# Patient Record
Sex: Female | Born: 1945
Health system: Southern US, Community
[De-identification: ages and names within clinical notes are randomized; demographics above are authoritative.]

## PROBLEM LIST (undated history)

## (undated) DIAGNOSIS — N184 Chronic kidney disease, stage 4 (severe): Secondary | ICD-10-CM

## (undated) DIAGNOSIS — G4730 Sleep apnea, unspecified: Secondary | ICD-10-CM

## (undated) DIAGNOSIS — J449 Chronic obstructive pulmonary disease, unspecified: Secondary | ICD-10-CM

## (undated) DIAGNOSIS — R519 Headache, unspecified: Secondary | ICD-10-CM

## (undated) DIAGNOSIS — E0500 Thyrotoxicosis with diffuse goiter without thyrotoxic crisis or storm: Secondary | ICD-10-CM

## (undated) DIAGNOSIS — G20A1 Parkinson's disease without dyskinesia, without mention of fluctuations: Secondary | ICD-10-CM

## (undated) DIAGNOSIS — R51 Headache: Secondary | ICD-10-CM

## (undated) DIAGNOSIS — IMO0002 Reserved for concepts with insufficient information to code with codable children: Secondary | ICD-10-CM

## (undated) DIAGNOSIS — J069 Acute upper respiratory infection, unspecified: Secondary | ICD-10-CM

## (undated) DIAGNOSIS — E538 Deficiency of other specified B group vitamins: Secondary | ICD-10-CM

## (undated) DIAGNOSIS — F17200 Nicotine dependence, unspecified, uncomplicated: Secondary | ICD-10-CM

## (undated) DIAGNOSIS — I10 Essential (primary) hypertension: Secondary | ICD-10-CM

## (undated) DIAGNOSIS — I809 Phlebitis and thrombophlebitis of unspecified site: Secondary | ICD-10-CM

## (undated) DIAGNOSIS — R197 Diarrhea, unspecified: Secondary | ICD-10-CM

## (undated) DIAGNOSIS — I2510 Atherosclerotic heart disease of native coronary artery without angina pectoris: Secondary | ICD-10-CM

## (undated) DIAGNOSIS — I6529 Occlusion and stenosis of unspecified carotid artery: Secondary | ICD-10-CM

## (undated) DIAGNOSIS — R0600 Dyspnea, unspecified: Secondary | ICD-10-CM

## (undated) DIAGNOSIS — F329 Major depressive disorder, single episode, unspecified: Secondary | ICD-10-CM

## (undated) DIAGNOSIS — R911 Solitary pulmonary nodule: Secondary | ICD-10-CM

## (undated) DIAGNOSIS — M797 Fibromyalgia: Secondary | ICD-10-CM

## (undated) DIAGNOSIS — K219 Gastro-esophageal reflux disease without esophagitis: Secondary | ICD-10-CM

## (undated) DIAGNOSIS — F419 Anxiety disorder, unspecified: Secondary | ICD-10-CM

## (undated) DIAGNOSIS — F32A Depression, unspecified: Secondary | ICD-10-CM

## (undated) DIAGNOSIS — D649 Anemia, unspecified: Secondary | ICD-10-CM

## (undated) DIAGNOSIS — I340 Nonrheumatic mitral (valve) insufficiency: Secondary | ICD-10-CM

## (undated) DIAGNOSIS — E46 Unspecified protein-calorie malnutrition: Secondary | ICD-10-CM

## (undated) DIAGNOSIS — M48061 Spinal stenosis, lumbar region without neurogenic claudication: Secondary | ICD-10-CM

## (undated) DIAGNOSIS — I82409 Acute embolism and thrombosis of unspecified deep veins of unspecified lower extremity: Secondary | ICD-10-CM

## (undated) DIAGNOSIS — G8929 Other chronic pain: Secondary | ICD-10-CM

## (undated) DIAGNOSIS — K279 Peptic ulcer, site unspecified, unspecified as acute or chronic, without hemorrhage or perforation: Secondary | ICD-10-CM

## (undated) DIAGNOSIS — J310 Chronic rhinitis: Secondary | ICD-10-CM

## (undated) DIAGNOSIS — M810 Age-related osteoporosis without current pathological fracture: Secondary | ICD-10-CM

## (undated) DIAGNOSIS — I739 Peripheral vascular disease, unspecified: Secondary | ICD-10-CM

## (undated) DIAGNOSIS — H9190 Unspecified hearing loss, unspecified ear: Secondary | ICD-10-CM

## (undated) DIAGNOSIS — G20 Parkinson's disease: Secondary | ICD-10-CM

## (undated) DIAGNOSIS — N189 Chronic kidney disease, unspecified: Secondary | ICD-10-CM

## (undated) DIAGNOSIS — M1990 Unspecified osteoarthritis, unspecified site: Secondary | ICD-10-CM

## (undated) HISTORY — DX: Headache, unspecified: R51.9

## (undated) HISTORY — DX: Reserved for concepts with insufficient information to code with codable children: IMO0002

## (undated) HISTORY — DX: Peripheral vascular disease, unspecified: I73.9

## (undated) HISTORY — DX: Anemia, unspecified: D64.9

## (undated) HISTORY — DX: Essential (primary) hypertension: I10

## (undated) HISTORY — DX: Nicotine dependence, unspecified, uncomplicated: F17.200

## (undated) HISTORY — DX: Anxiety disorder, unspecified: F41.9

## (undated) HISTORY — DX: Deficiency of other specified B group vitamins: E53.8

## (undated) HISTORY — DX: Chronic rhinitis: J31.0

## (undated) HISTORY — DX: Depression, unspecified: F32.A

## (undated) HISTORY — DX: Atherosclerotic heart disease of native coronary artery without angina pectoris: I25.10

## (undated) HISTORY — DX: Chronic kidney disease, unspecified: N18.9

## (undated) HISTORY — DX: Chronic kidney disease, stage 4 (severe): N18.4

## (undated) HISTORY — DX: Acute embolism and thrombosis of unspecified deep veins of unspecified lower extremity: I82.409

## (undated) HISTORY — PX: GASTRECTOMY: SHX58

## (undated) HISTORY — DX: Diarrhea, unspecified: R19.7

## (undated) HISTORY — DX: Peptic ulcer, site unspecified, unspecified as acute or chronic, without hemorrhage or perforation: K27.9

## (undated) HISTORY — DX: Spinal stenosis, lumbar region without neurogenic claudication: M48.061

## (undated) HISTORY — DX: Nonrheumatic mitral (valve) insufficiency: I34.0

## (undated) HISTORY — DX: Occlusion and stenosis of unspecified carotid artery: I65.29

## (undated) HISTORY — DX: Fibromyalgia: M79.7

## (undated) HISTORY — DX: Other chronic pain: G89.29

## (undated) HISTORY — DX: Unspecified protein-calorie malnutrition: E46

## (undated) HISTORY — DX: Solitary pulmonary nodule: R91.1

## (undated) HISTORY — DX: Headache: R51

## (undated) HISTORY — DX: Age-related osteoporosis without current pathological fracture: M81.0

## (undated) HISTORY — DX: Major depressive disorder, single episode, unspecified: F32.9

## (undated) HISTORY — DX: Phlebitis and thrombophlebitis of unspecified site: I80.9

## (undated) HISTORY — DX: Thyrotoxicosis with diffuse goiter without thyrotoxic crisis or storm: E05.00

---

## 1997-10-29 HISTORY — PX: CHOLECYSTECTOMY: SHX55

## 1998-04-26 ENCOUNTER — Ambulatory Visit (HOSPITAL_COMMUNITY): Admission: RE | Admit: 1998-04-26 | Discharge: 1998-04-26 | Payer: Self-pay | Admitting: *Deleted

## 1998-05-04 ENCOUNTER — Ambulatory Visit (HOSPITAL_COMMUNITY): Admission: RE | Admit: 1998-05-04 | Discharge: 1998-05-04 | Payer: Self-pay | Admitting: *Deleted

## 1998-05-19 ENCOUNTER — Inpatient Hospital Stay (HOSPITAL_COMMUNITY): Admission: EM | Admit: 1998-05-19 | Discharge: 1998-05-25 | Payer: Self-pay

## 1998-06-15 ENCOUNTER — Inpatient Hospital Stay (HOSPITAL_COMMUNITY): Admission: RE | Admit: 1998-06-15 | Discharge: 1998-06-26 | Payer: Self-pay | Admitting: Surgery

## 1998-06-15 ENCOUNTER — Encounter: Payer: Self-pay | Admitting: Surgery

## 1998-06-30 ENCOUNTER — Inpatient Hospital Stay (HOSPITAL_COMMUNITY): Admission: EM | Admit: 1998-06-30 | Discharge: 1998-07-04 | Payer: Self-pay | Admitting: Surgery

## 1998-06-30 ENCOUNTER — Encounter: Payer: Self-pay | Admitting: Surgery

## 1998-10-12 ENCOUNTER — Ambulatory Visit (HOSPITAL_COMMUNITY): Admission: RE | Admit: 1998-10-12 | Discharge: 1998-10-12 | Payer: Self-pay | Admitting: Endocrinology

## 1998-10-12 ENCOUNTER — Encounter: Payer: Self-pay | Admitting: Endocrinology

## 2000-01-29 ENCOUNTER — Other Ambulatory Visit: Admission: RE | Admit: 2000-01-29 | Discharge: 2000-01-29 | Payer: Self-pay | Admitting: Gynecology

## 2001-10-09 ENCOUNTER — Emergency Department (HOSPITAL_COMMUNITY): Admission: EM | Admit: 2001-10-09 | Discharge: 2001-10-09 | Payer: Self-pay

## 2001-10-26 ENCOUNTER — Emergency Department (HOSPITAL_COMMUNITY): Admission: EM | Admit: 2001-10-26 | Discharge: 2001-10-26 | Payer: Self-pay | Admitting: Emergency Medicine

## 2001-10-26 ENCOUNTER — Encounter: Payer: Self-pay | Admitting: Emergency Medicine

## 2001-11-11 ENCOUNTER — Encounter: Payer: Self-pay | Admitting: Gastroenterology

## 2001-11-11 ENCOUNTER — Encounter: Admission: RE | Admit: 2001-11-11 | Discharge: 2001-11-11 | Payer: Self-pay | Admitting: Gastroenterology

## 2002-09-15 ENCOUNTER — Encounter: Admission: RE | Admit: 2002-09-15 | Discharge: 2002-09-15 | Payer: Self-pay

## 2002-10-21 ENCOUNTER — Encounter: Admission: RE | Admit: 2002-10-21 | Discharge: 2002-10-21 | Payer: Self-pay

## 2002-11-10 ENCOUNTER — Inpatient Hospital Stay (HOSPITAL_COMMUNITY): Admission: EM | Admit: 2002-11-10 | Discharge: 2002-11-13 | Payer: Self-pay | Admitting: Emergency Medicine

## 2002-11-10 ENCOUNTER — Encounter: Payer: Self-pay | Admitting: Emergency Medicine

## 2003-03-05 ENCOUNTER — Inpatient Hospital Stay (HOSPITAL_COMMUNITY): Admission: EM | Admit: 2003-03-05 | Discharge: 2003-03-06 | Payer: Self-pay | Admitting: *Deleted

## 2003-03-05 ENCOUNTER — Encounter: Payer: Self-pay | Admitting: *Deleted

## 2003-06-14 ENCOUNTER — Encounter: Admission: RE | Admit: 2003-06-14 | Discharge: 2003-06-14 | Payer: Self-pay

## 2003-07-08 ENCOUNTER — Encounter: Admission: RE | Admit: 2003-07-08 | Discharge: 2003-07-08 | Payer: Self-pay

## 2004-01-14 ENCOUNTER — Encounter: Admission: RE | Admit: 2004-01-14 | Discharge: 2004-01-14 | Payer: Self-pay | Admitting: Gastroenterology

## 2004-05-15 ENCOUNTER — Encounter: Admission: RE | Admit: 2004-05-15 | Discharge: 2004-05-15 | Payer: Self-pay | Admitting: Gastroenterology

## 2004-06-26 ENCOUNTER — Ambulatory Visit (HOSPITAL_COMMUNITY): Admission: RE | Admit: 2004-06-26 | Discharge: 2004-06-26 | Payer: Self-pay | Admitting: Gastroenterology

## 2004-08-08 ENCOUNTER — Encounter (INDEPENDENT_AMBULATORY_CARE_PROVIDER_SITE_OTHER): Payer: Self-pay | Admitting: *Deleted

## 2004-08-08 ENCOUNTER — Ambulatory Visit (HOSPITAL_COMMUNITY): Admission: RE | Admit: 2004-08-08 | Discharge: 2004-08-08 | Payer: Self-pay | Admitting: Gastroenterology

## 2004-08-22 ENCOUNTER — Ambulatory Visit (HOSPITAL_COMMUNITY): Admission: RE | Admit: 2004-08-22 | Discharge: 2004-08-22 | Payer: Self-pay | Admitting: Gastroenterology

## 2004-08-28 ENCOUNTER — Ambulatory Visit (HOSPITAL_COMMUNITY): Admission: RE | Admit: 2004-08-28 | Discharge: 2004-08-28 | Payer: Self-pay | Admitting: Gastroenterology

## 2005-02-12 ENCOUNTER — Encounter: Admission: RE | Admit: 2005-02-12 | Discharge: 2005-02-12 | Payer: Self-pay

## 2006-02-12 ENCOUNTER — Inpatient Hospital Stay (HOSPITAL_COMMUNITY): Admission: EM | Admit: 2006-02-12 | Discharge: 2006-02-22 | Payer: Self-pay | Admitting: Emergency Medicine

## 2006-04-12 ENCOUNTER — Encounter (INDEPENDENT_AMBULATORY_CARE_PROVIDER_SITE_OTHER): Payer: Self-pay | Admitting: Specialist

## 2006-04-13 ENCOUNTER — Inpatient Hospital Stay (HOSPITAL_COMMUNITY): Admission: RE | Admit: 2006-04-13 | Discharge: 2006-04-14 | Payer: Self-pay | Admitting: General Surgery

## 2006-05-02 ENCOUNTER — Emergency Department (HOSPITAL_COMMUNITY): Admission: EM | Admit: 2006-05-02 | Discharge: 2006-05-03 | Payer: Self-pay | Admitting: Emergency Medicine

## 2006-05-04 ENCOUNTER — Ambulatory Visit (HOSPITAL_COMMUNITY): Admission: RE | Admit: 2006-05-04 | Discharge: 2006-05-04 | Payer: Self-pay | Admitting: Emergency Medicine

## 2006-05-31 ENCOUNTER — Encounter: Admission: RE | Admit: 2006-05-31 | Discharge: 2006-05-31 | Payer: Self-pay | Admitting: Gastroenterology

## 2006-06-25 ENCOUNTER — Encounter: Admission: RE | Admit: 2006-06-25 | Discharge: 2006-06-25 | Payer: Self-pay

## 2007-03-28 ENCOUNTER — Encounter: Payer: Self-pay | Admitting: Emergency Medicine

## 2007-03-29 ENCOUNTER — Inpatient Hospital Stay (HOSPITAL_COMMUNITY): Admission: RE | Admit: 2007-03-29 | Discharge: 2007-04-18 | Payer: Self-pay | Admitting: Internal Medicine

## 2007-04-04 ENCOUNTER — Encounter (INDEPENDENT_AMBULATORY_CARE_PROVIDER_SITE_OTHER): Payer: Self-pay | Admitting: Gastroenterology

## 2007-04-09 ENCOUNTER — Encounter (INDEPENDENT_AMBULATORY_CARE_PROVIDER_SITE_OTHER): Payer: Self-pay | Admitting: Internal Medicine

## 2007-04-09 ENCOUNTER — Ambulatory Visit: Payer: Self-pay | Admitting: Vascular Surgery

## 2007-04-14 ENCOUNTER — Ambulatory Visit: Payer: Self-pay | Admitting: Physical Medicine & Rehabilitation

## 2007-10-31 ENCOUNTER — Ambulatory Visit (HOSPITAL_COMMUNITY): Admission: RE | Admit: 2007-10-31 | Discharge: 2007-10-31 | Payer: Self-pay | Admitting: Anesthesiology

## 2007-11-05 ENCOUNTER — Ambulatory Visit (HOSPITAL_COMMUNITY): Admission: RE | Admit: 2007-11-05 | Discharge: 2007-11-05 | Payer: Self-pay

## 2008-02-03 ENCOUNTER — Encounter (HOSPITAL_COMMUNITY): Admission: RE | Admit: 2008-02-03 | Discharge: 2008-05-03 | Payer: Self-pay | Admitting: Neurology

## 2008-05-23 ENCOUNTER — Encounter: Admission: RE | Admit: 2008-05-23 | Discharge: 2008-05-23 | Payer: Self-pay

## 2008-05-24 ENCOUNTER — Encounter (HOSPITAL_COMMUNITY): Admission: RE | Admit: 2008-05-24 | Discharge: 2008-07-08 | Payer: Self-pay | Admitting: Neurology

## 2008-06-14 ENCOUNTER — Encounter: Admission: RE | Admit: 2008-06-14 | Discharge: 2008-08-30 | Payer: Self-pay | Admitting: Orthopedic Surgery

## 2010-05-23 ENCOUNTER — Encounter: Admission: RE | Admit: 2010-05-23 | Discharge: 2010-05-23 | Payer: Self-pay | Admitting: Internal Medicine

## 2010-07-05 ENCOUNTER — Ambulatory Visit: Payer: Self-pay | Admitting: Vascular Surgery

## 2010-07-18 ENCOUNTER — Inpatient Hospital Stay (HOSPITAL_COMMUNITY): Admission: RE | Admit: 2010-07-18 | Discharge: 2010-07-19 | Payer: Self-pay | Admitting: Vascular Surgery

## 2010-07-18 ENCOUNTER — Ambulatory Visit: Payer: Self-pay | Admitting: Vascular Surgery

## 2010-07-18 ENCOUNTER — Encounter: Payer: Self-pay | Admitting: Vascular Surgery

## 2010-07-18 HISTORY — PX: CAROTID ENDARTERECTOMY: SUR193

## 2010-08-09 ENCOUNTER — Ambulatory Visit: Payer: Self-pay | Admitting: Vascular Surgery

## 2010-11-18 ENCOUNTER — Encounter: Payer: Self-pay | Admitting: Gastroenterology

## 2011-01-11 LAB — URINALYSIS, ROUTINE W REFLEX MICROSCOPIC
Bilirubin Urine: NEGATIVE
Glucose, UA: NEGATIVE mg/dL
Hgb urine dipstick: NEGATIVE
Ketones, ur: NEGATIVE mg/dL
Nitrite: NEGATIVE
Protein, ur: NEGATIVE mg/dL
Specific Gravity, Urine: 1.023 (ref 1.005–1.030)
Urobilinogen, UA: 0.2 mg/dL (ref 0.0–1.0)
pH: 5.5 (ref 5.0–8.0)

## 2011-01-11 LAB — CBC
HCT: 33.8 % — ABNORMAL LOW (ref 36.0–46.0)
HCT: 38.4 % (ref 36.0–46.0)
Hemoglobin: 10.8 g/dL — ABNORMAL LOW (ref 12.0–15.0)
Hemoglobin: 12.7 g/dL (ref 12.0–15.0)
MCH: 30.9 pg (ref 26.0–34.0)
MCH: 31.4 pg (ref 26.0–34.0)
MCHC: 32.5 g/dL (ref 30.0–36.0)
MCHC: 33.1 g/dL (ref 30.0–36.0)
MCV: 94.9 fL (ref 78.0–100.0)
MCV: 94.8 fL (ref 78.0–100.0)
Platelets: 152 10*3/uL (ref 150–400)
Platelets: 179 10*3/uL (ref 150–400)
RBC: 3.56 MIL/uL — ABNORMAL LOW (ref 3.87–5.11)
RBC: 4.05 MIL/uL (ref 3.87–5.11)
RDW: 13.5 % (ref 11.5–15.5)
RDW: 13.4 % (ref 11.5–15.5)
WBC: 8.2 10*3/uL (ref 4.0–10.5)
WBC: 6.6 10*3/uL (ref 4.0–10.5)

## 2011-01-11 LAB — GLUCOSE, CAPILLARY
Glucose-Capillary: 103 mg/dL — ABNORMAL HIGH (ref 70–99)
Glucose-Capillary: 210 mg/dL — ABNORMAL HIGH (ref 70–99)
Glucose-Capillary: 79 mg/dL (ref 70–99)
Glucose-Capillary: 82 mg/dL (ref 70–99)
Glucose-Capillary: 96 mg/dL (ref 70–99)

## 2011-01-11 LAB — BASIC METABOLIC PANEL
BUN: 27 mg/dL — ABNORMAL HIGH (ref 6–23)
CO2: 25 mEq/L (ref 19–32)
Calcium: 7.8 mg/dL — ABNORMAL LOW (ref 8.4–10.5)
Chloride: 111 mEq/L (ref 96–112)
Creatinine, Ser: 1.47 mg/dL — ABNORMAL HIGH (ref 0.4–1.2)
GFR calc Af Amer: 43 mL/min — ABNORMAL LOW (ref >60)
GFR calc non Af Amer: 36 mL/min — ABNORMAL LOW (ref >60)
Glucose, Bld: 94 mg/dL (ref 70–99)
Potassium: 4.5 mEq/L (ref 3.5–5.1)
Sodium: 139 mEq/L (ref 135–145)

## 2011-01-11 LAB — COMPREHENSIVE METABOLIC PANEL
ALT: 15 U/L (ref 0–35)
AST: 20 U/L (ref 0–37)
Albumin: 3.4 g/dL — ABNORMAL LOW (ref 3.5–5.2)
Alkaline Phosphatase: 92 U/L (ref 39–117)
BUN: 28 mg/dL — ABNORMAL HIGH (ref 6–23)
CO2: 32 mEq/L (ref 19–32)
Calcium: 8.7 mg/dL (ref 8.4–10.5)
Chloride: 102 mEq/L (ref 96–112)
Creatinine, Ser: 1.83 mg/dL — ABNORMAL HIGH (ref 0.4–1.2)
GFR calc Af Amer: 34 mL/min — ABNORMAL LOW (ref >60)
GFR calc non Af Amer: 28 mL/min — ABNORMAL LOW (ref >60)
Glucose, Bld: 83 mg/dL (ref 70–99)
Potassium: 5.6 mEq/L — ABNORMAL HIGH (ref 3.5–5.1)
Sodium: 141 mEq/L (ref 135–145)
Total Bilirubin: 0.6 mg/dL (ref 0.3–1.2)
Total Protein: 5.2 g/dL — ABNORMAL LOW (ref 6.0–8.3)

## 2011-01-11 LAB — POCT I-STAT 4, (NA,K, GLUC, HGB,HCT)
Glucose, Bld: 79 mg/dL (ref 70–99)
HCT: 41 % (ref 36.0–46.0)
Hemoglobin: 13.9 g/dL (ref 12.0–15.0)
Potassium: 5.1 mEq/L (ref 3.5–5.1)
Sodium: 140 mEq/L (ref 135–145)

## 2011-01-11 LAB — PROTIME-INR
INR: 0.87 (ref 0.00–1.49)
Prothrombin Time: 12 seconds (ref 11.6–15.2)

## 2011-01-11 LAB — TYPE AND SCREEN
ABO/RH(D): A POS
Antibody Screen: NEGATIVE

## 2011-01-11 LAB — SURGICAL PCR SCREEN
MRSA, PCR: NEGATIVE
Staphylococcus aureus: NEGATIVE

## 2011-01-11 LAB — URINE MICROSCOPIC-ADD ON

## 2011-01-11 LAB — APTT: aPTT: 27 seconds (ref 24–37)

## 2011-01-31 ENCOUNTER — Other Ambulatory Visit (INDEPENDENT_AMBULATORY_CARE_PROVIDER_SITE_OTHER): Payer: Medicare Other

## 2011-01-31 ENCOUNTER — Ambulatory Visit: Payer: Medicare Other | Admitting: Vascular Surgery

## 2011-01-31 DIAGNOSIS — Z48812 Encounter for surgical aftercare following surgery on the circulatory system: Secondary | ICD-10-CM

## 2011-01-31 DIAGNOSIS — I6529 Occlusion and stenosis of unspecified carotid artery: Secondary | ICD-10-CM

## 2011-02-14 ENCOUNTER — Ambulatory Visit: Payer: Medicare Other | Admitting: Vascular Surgery

## 2011-02-15 NOTE — Procedures (Unsigned)
CAROTID DUPLEX EXAM  INDICATION:  Left carotid endarterectomy.  HISTORY: Diabetes:  Yes. Cardiac:  No. Hypertension:  Yes. Smoking:  Yes. Previous Surgery:  Left carotid endarterectomy on 07/18/2010. CV History:  Currently asymptomatic. Amaurosis Fugax No, Paresthesias No, Hemiparesis No. Other:  Patient declined to have bilateral brachial pressures taken.                                      RIGHT             LEFT Brachial systolic pressure: Brachial Doppler waveforms: Vertebral direction of flow:        Antegrade         Antegrade DUPLEX VELOCITIES (cm/sec) CCA peak systolic                   63                99 ECA peak systolic                   199               84 ICA peak systolic                   261               68 ICA end diastolic                   79                22 PLAQUE MORPHOLOGY:                  Heterogenous PLAQUE AMOUNT:                      Moderate/severe   None PLAQUE LOCATION:                    ICA/ECA  IMPRESSION: 1. Doppler velocities suggest a high-end 60% to 79% stenosis of the     right proximal internal carotid artery. 2. Patent left carotid endarterectomy site with no left internal     carotid artery stenosis. 3. Significant improvement of the left internal carotid artery when     compared to the previous examination on 07/05/2010 with no change     in the right internal carotid artery noted.  ___________________________________________ Judeth Cornfield. Scot Dock, M.D.  CH/MEDQ  D:  01/31/2011  T:  01/31/2011  Job:  [id number]

## 2011-03-07 ENCOUNTER — Ambulatory Visit (INDEPENDENT_AMBULATORY_CARE_PROVIDER_SITE_OTHER): Payer: Medicare Other | Admitting: Vascular Surgery

## 2011-03-07 DIAGNOSIS — I6529 Occlusion and stenosis of unspecified carotid artery: Secondary | ICD-10-CM

## 2011-03-08 NOTE — Assessment & Plan Note (Signed)
OFFICE VISIT  [name], [name] DOB:  1946-01-08                                       03/07/2011 [id number]  I saw patient in the office today for continued follow-up of her carotid disease.  This is a pleasant 65 year old woman who underwent a left carotid endarterectomy in September 2011 for greater than 80% left carotid stenosis.  She comes in for a 47-month follow-up visit.  Since I saw her last, she has had no history of stroke, TIAs, expressive or receptive aphasia, or amaurosis fugax.  SOCIAL HISTORY:  She continues to smoke 2 packs per day of cigarettes and has been smoking for 50 years.  REVIEW OF SYSTEMS:  CARDIOVASCULAR:  She has had no chest pain, chest pressure, palpitations or arrhythmias. PULMONARY:  She has had no productive cough, bronchitis, asthma, or wheezing.  PHYSICAL EXAMINATION:  This is a pleasant 65 year old woman who appears her stated age.  Blood pressure is 146/67, heart rate is 79, respiratory rate 16.  Lungs are clear bilaterally to auscultation without rales, rhonchi or wheezing.  Cardiovascular:  She has a right carotid bruit. She has a regular rate and rhythm.  Abdomen:  Soft and nontender with normal pitched bowel sounds.  Neurologic:  She has no focal weakness or paresthesias.  I have independently interpreted her carotid duplex scan from 01/31/11, which shows no evidence of recurrent carotid stenosis on the left where she has had a left carotid endarterectomy.  She has a 60% to 79% right carotid stenosis, and the velocities have remained relatively stable compared to her study 6 months ago.  She understands that we would only consider right carotid endarterectomy if the stenosis progressed to greater than 80% or if she developed new neurologic symptoms.  We have discussed potential symptoms of carotid disease.  In addition, we have had a long discussion about the importance of tobacco cessation, and she  understands that continued tobacco use does certainly increase her risk of continued progression of her disease and risk of stroke.  I have given her the information for [facility] tobacco cessation program.  I have encouraged her to continue taking her aspirin.  I will see her back in 1 year unless her duplex scan in 6 months shows any significant change.    Judeth Cornfield. Scot Dock, M.D. Electronically Signed  CSD/MEDQ  D:  03/07/2011  T:  03/08/2011  Job:  [id number]  cc:   Thressa Sheller, M.D.

## 2011-03-13 NOTE — Consult Note (Signed)
NAMEMarland Kitchen  [name], [name] NO.:  [id number]   MEDICAL RECORD NO.:  [id number]          PATIENT TYPE:  INP   LOCATION:  2107                         FACILITY:  [facility]   PHYSICIAN:  Merri Ray. Grandville Silos, M.D.DATE OF BIRTH:  1945-11-15   DATE OF CONSULTATION:  03/29/2007  DATE OF DISCHARGE:                                 CONSULTATION   CHIEF COMPLAINT:  Diarrhea and abdominal pain.   HISTORY OF PRESENT ILLNESS:  The patient is a 65 year old white female  who I was asked to evaluate by Dr. Stephannie Peters from the [facility] in  regard to diarrhea and abdominal pain.  She is known to me status post  laparoscopic cholecystectomy in 03/2006.  She was admitted to the  [facility] overnight with prolonged diarrhea and significant  dehydration with acute renal failure. She has had two weeks of  significant diarrhea.  This was persistent and she had some mental  status changes, prompting her family to bring her for evaluation. She  was initially seen at [facility] but has been transferred to the  intensive care unit at [facility]. The patient has had a history of  recurrent urinary tract infections as well.  She was recently treated on  Cipro. The patient is currently complaining of back pain and abdominal  pain, but she cannot give much other history at this time. Some of  further history is gleaned from the history and physical as family  members are not present at this time.   PAST MEDICAL HISTORY:  Asthma, fibromyalgia, hypertension, anemia,  hypothyroidism and Cushing disease.   PAST SURGICAL HISTORY:  Laparoscopic cholecystectomy.  She has also had  a Billroth gastrectomy, uncertain if it is a I or  II.   SOCIAL HISTORY:  She is a current smoker.  She does not drink alcohol   ALLERGIES:  NO KNOWN DRUG ALLERGIES.   FAMILY HISTORY:  Some diabetes.   REVIEW OF SYSTEMS:  She is unable to comply, due to her mental status,  beyond what is listed above.   PHYSICAL  EXAM:  VITAL SIGNS: Temperature 100.4, blood pressure 99/36,  heart rate 109, respirations 26, saturations 100%. Urine output has been  about 800 mL/90.  NEURO EXAM: She is drowsy.  She does answer a few questions but is not  able to completely cooperate.  NECK: Supple.  LUNGS:  Clear to auscultation.  HEART: Regular, slightly tachycardiac.  ABDOMEN: Nondistended.  Bowel sounds are present.  She has some diffuse,  moderate tenderness. She is more tender and the bilateral lower  quadrants with some intermittent voluntary guarding. She has healed  scars from previous surgery.  EXTREMITIES:  Have no significant edema.   Data reviewed includes white blood cell count 13.7, hemoglobin 8.8,  creatinine 3.12, potassium 5.9. Chest x-ray  demonstrates no free  abdominal air.  There is atelectasis present. Abdominal x-ray on 05/30  had some small bowel gas, but was unremarkable with no colonic  distension.   IMPRESSION AND PLAN:  Diarrhea and abdominal pain with acute renal  failure. I agree that C. difficile colitis is  a possibility. She may  also have some ischemic colitis. Her exam is concerning but plain films  have been normal.  I agree with checking a CT scan of her abdomen and  pelvis soon this morning. I also agree with continuing hydration per the  Medical Service. Her anemia and hyperkalemia will also be addressed by  her primary service.  I would recommend holding her Lovenox pending her  CT results in case she needs emergency surgery.  Apparently the patient  is a do not resuscitate as she has an order on the chart. If findings on  the CT would suggest need for operative intervention, we will need to  speak with her family further.      Merri Ray Grandville Silos, M.D.  Electronically Signed     BET/MEDQ  D:  03/29/2007  T:  03/29/2007  Job:  [id number]

## 2011-03-13 NOTE — Op Note (Signed)
NAMEMarland Kitchen  [name], [name] NO.:  [id number]   MEDICAL RECORD NO.:  [id number]          PATIENT TYPE:  INP   LOCATION:  3017                         FACILITY:  [facility]   PHYSICIAN:  John C. Amedeo Plenty, M.D.    DATE OF BIRTH:  04-04-1946   DATE OF PROCEDURE:  04/04/2007  DATE OF DISCHARGE:                               OPERATIVE REPORT   PROCEDURE PERFORMED:  Esophagogastroduodenoscopy with biopsy.   INDICATIONS FOR PROCEDURE:  Persistent diarrhea and abdominal pain with  as far as I can tell celiac disease has not been ruled out.   The procedure is done today in conjunction with flexible sigmoidoscopy  to rule out microscopic colitis or C. difficile negative  Pseudomembranous colitis.   PROCEDURE IN DETAIL:  The patient was placed in the left lateral  decubitus position and placed on the pulse monitor with continuous low-  flow oxygen delivered by nasal cannula.  She was sedated with a 100 mcg  IV fentanyl and 7.5 mg IV versed.  The Olympus endoscope was advanced  under direct vision into the oropharynx and the esophagus.  The  esophagus was straight and of normal caliber.  At the squamocolumnar  line at 38-cm, there was no visible hiatal hernia, ring strictures or  other abnormalities of the GE junction.  The stomach was entered and a  small amount of liquid secretions were suctioned from the fundus.  A  retroflexed view of the cardia was unremarkable.  The fundus, body,  antrum and pylorus all appeared normal.  The duodenum was entered and  both the bulb and second portions were well-inspected and appeared to be  within normal limits.  Biopsies were taken of the distal duodenum to  rule out celiac disease.   The scope was then withdrawn and the patient prepared for sigmoidoscopy.  She tolerated the procedure well.  There were no immediate  complications.   IMPRESSION:  Normal endoscopy.   PLAN:  Await biopsy results and we will proceed with sigmoidoscopy.       ______________________________  Elyse Jarvis. Amedeo Plenty, M.D.     JCH/MEDQ  D:  04/04/2007  T:  04/04/2007  Job:  [id number]   cc:   Jeryl Columbia, M.D.

## 2011-03-13 NOTE — Assessment & Plan Note (Signed)
OFFICE VISIT   [name], [name]  DOB:  1946-03-08                                       08/09/2010  [id number]   I saw the patient in the office today for followup after her recent left  carotid endarterectomy.  This is a 65 year old woman who was found to  have a carotid bruit by Dr. Noah Delaine.  This prompted a duplex scan  which showed bilateral carotid disease with a greater than 80% left  carotid stenosis.  Left carotid endarterectomy was recommended in order  to lower her risk of future stroke.  She underwent left carotid  endarterectomy with Dacron patch angioplasty on 07/18/2010.  She did  well postoperatively and was discharged on postoperative day #1.  She  returns for her first outpatient visit.  Overall, she has been doing  well and has no specific complaints.  She has had no focal weakness or  paresthesias.   PHYSICAL EXAMINATION:  General:  This is a pleasant 65 year old woman  who appears her stated age.  Vital signs:  Blood pressure is 121/71,  heart rate is 82, temperature is 98.4.  Lungs:  Clear bilaterally to  auscultation.  Neck:  Incision has healed nicely.  She has good strength  in her upper extremities and lower extremities bilaterally.   Overall I am pleased with her progress.  She does have a 60%-79% left  carotid stenosis and I have arranged for a carotid duplex scan in 6  months.  I will see her back at that time.  In the meantime she knows to  continue taking her aspirin.  She knows to call sooner if she has  problems.     Judeth Cornfield. Scot Dock, M.D.  Electronically Signed   CSD/MEDQ  D:  08/09/2010  T:  08/10/2010  Job:  [id number]   cc:   Thressa Sheller, M.D.

## 2011-03-13 NOTE — Consult Note (Signed)
NAMEMarland Kitchen  KIAHRA, SEYBERT NO.:  [id number]   MEDICAL RECORD NO.:  [id number]          PATIENT TYPE:  INP   LOCATION:  2107                         FACILITY:  [facility]   PHYSICIAN:  Wonda Horner, M.D.   DATE OF BIRTH:  1946/05/23   DATE OF CONSULTATION:  03/29/2007  DATE OF DISCHARGE:                                 CONSULTATION   REASON FOR CONSULTATION:  The patient is a 65 year old white female who  sees Dr. Watt Climes in our practice. The patient has a history of chronic  diarrhea.  She also has had a prior Billroth I gastrojejunostomy.  Over  the past couple of weeks, the patient has had a worsening of her  diarrhea.  She saw Dr. Watt Climes a week or so ago, and he tried her on  Questran to see if this would help.  The patient's sister who is  providing the history states that it might have helped a little, but she  continued to have severe diarrhea.  There is no report of frank bleeding  in the diarrhea.  The patient is unable to give any history and the  sister is giving it all.  The patient has a history of recurring urinary  tract infections. She has taken Cipro in the past for this, but  according to the sister, she has not been on any Cipro in a couple of  months.  According to the sister also, the patient has been experiencing  abdominal pain.  Stool studies have been ordered and are pending.  The  CT of the abdomen and pelvis was done today.  There was no evidence of  colitis, but there is diffuse small bowel wall thickening which could be  ischemic or infectious.  The CT also raises the question of pneumonia.   PAST HISTORY:  From review of chart is that she has history of asthma,  fibromyalgia, hypertension, anemia, B12 deficiency, hypothyroidism, and  Cushing's.   ALLERGIES:  No drug allergies.   MEDICATIONS:  Medications taking at home according to the H and P were  Levsin, Cipro, Vistaril, Lasix, Phenergan, PTU, Prozac, Klonopin,  Percocet, prednisone,  enalapril, hydrochlorothiazide, potassium  chloride, Nexium, Robinul, iron and Questran.  As stated above, however,  the sister said that she had not taken Cipro in a couple of months.   PAST SURGICAL HISTORY:  Prior surgeries have included a cholecystectomy  and Billroth I surgery.   PHYSICAL EXAMINATION:  VITAL SIGNS:  Stable.  She is very drowsy.  HEART:  Regular rhythm.  LUNGS:  Clear.  ABDOMEN:  Bowel sounds were present.  There is diffuse  tenderness.   White blood cell count 13,700.   IMPRESSION:  1. Chronic diarrhea.  2. Acute worsening diarrhea, rule out infectious versus ischemic as      the source of all of this new onset diarrhea.  Stool studies are      being checked.  3. Abnormal CT scan with diffuse small bowel wall thickening, ischemic      versus infectious in etiology.  4. Sepsis.  Blood cultures are being checked.  The patient is on      Zosyn.  5. Pneumonia per CT scan is a consideration.   PLAN:  From a GI standpoint, the stools are being checked to look for  enteric pathogens, ova and parasites and also Clostridium difficile  toxin.  Another consideration is ischemic.  Supportive care will be  continued.  Antibiotics will be continued.           ______________________________  Wonda Horner, M.D.     SFG/MEDQ  D:  03/29/2007  T:  03/29/2007  Job:  [id number]   cc:   Dr. Denese Killings, MD

## 2011-03-13 NOTE — Procedures (Signed)
CAROTID DUPLEX EXAM   INDICATION:  Carotid stenosis.   HISTORY:  Diabetes:  Yes.  Cardiac:  No.  Hypertension:  Yes.  Smoking:  Yes.  Previous Surgery:  No.  CV History:  Asymptomatic.  Amaurosis Fugax , Paresthesias , Hemiparesis                                       RIGHT             LEFT  Brachial systolic pressure:         116               112  Brachial Doppler waveforms:         Within normal limits                Within normal limits  Vertebral direction of flow:        Antegrade         Antegrade  DUPLEX VELOCITIES (cm/sec)  CCA peak systolic                   82                96  ECA peak systolic                   147               0000000  ICA peak systolic                   305               [id number]  ICA end diastolic                   94                132  PLAQUE MORPHOLOGY:                  Heterogenous      Heterogenous  PLAQUE AMOUNT:                      Large             Large  PLAQUE LOCATION:                    ICA, ECA          CCA, ICA, ECA   IMPRESSION:  1. Right internal carotid artery velocities suggestive of 60% to 79%      stenosis, high end.  2. Left internal carotid artery velocities suggestive of 80% to 99%      stenosis.  3. Bilateral external carotid artery stenosis.   ___________________________________________  Judeth Cornfield. Scot Dock, M.D.   EM/MEDQ  D:  07/05/2010  T:  07/05/2010  Job:  [id number]

## 2011-03-13 NOTE — H&P (Signed)
HISTORY AND PHYSICAL EXAMINATION   July 05, 2010   Re:  [name], [name]               DOB:  [birth year]   REASON FOR ADMISSION:  Greater than 80% asymptomatic left carotid  stenosis.   HISTORY:  This is a pleasant 65 year old woman who I saw in consultation  on 07/05/2010. Dr. Alyson Ingles heard a carotid bruit.  This prompted a  duplex scan which showed bilateral carotid disease and she was sent for  carotid evaluation.  Of note she is right-handed.  She has had no  previous history of stroke, TIAs, expressive or receptive aphasia or  amaurosis fugax.  She does state she had 2 episodes when she had some  numbness her left hand but this appears to be related to sleeping on her  arm.   PAST MEDICAL HISTORY:  Significant for adult onset diabetes,  hypertension, hypercholesterolemia, COPD, fibromyalgia, Graves disease,  inflammatory bowel disease.  She also has a history of peripheral  neuropathy.  She also has history of some mild renal insufficiency.   PAST SURGICAL HISTORY:  Significant for partial gastrectomy in the past  for peptic ulcer disease.   SOCIAL HISTORY:  She is widowed.  She has one child.  She continues to  smoke 2 packs per day of cigarettes.   FAMILY HISTORY:  She had 2 sisters with diabetes.  She is unaware of any  history of premature cardiovascular disease.   ALLERGIES:  No known drug allergies.   MEDICATIONS:  1. Multivitamin one p.o. daily.  2. Simvastatin 10 mg p.o. daily.  3. Amlodipine 5 mg p.o. daily.  4. Furosemide 20 mg p.o. daily.  5. Dicyclomine 20 mg p.o. daily.  6. Vitamin D p.o. daily.  7. Aspirin 81 mg p.o. daily.  8. Prevacid 30 mg p.o. b.i.d.  9. Actos 15 mg p.o. daily.  10.Reglan 5 mg p.o. q.i.d.  11.Klor-Con 20 one p.o. daily.  12.Prednisone 5 mg p.o. daily.  13.Promethazine 25 mg p.o. p.r.n.  14.Klonopin 1 mg p.o. p.r.n.  15.Cymbalta 60 mg p.o. b.i.d.  16.Robinul Forte 2 mg p.o. p.r.n.  17.Percocet 5/325 mg  one every 6-8 hours p.r.n. pain.  18.Fentanyl 75 mcg every 48 hours on and off patch.   REVIEW OF SYSTEMS:  GENERAL:  She has had no recent weight loss, weight  gain or problems with appetite.  CARDIOVASCULAR:  She has had no chest pain, chest pressure.  She has  occasional palpitations.  She does admit to dyspnea on exertion.  She  has had some mild leg pain with ambulation.  GI:  She has a history of peptic ulcer disease and also history of  reflux.  She has occasional problems swallowing.  NEUROLOGIC:  She has had headaches.  PULMONARY:  She has had productive cough in the past, she has had  occasional problems with asthma and wheezing.  GU:  She has some dysuria  and urinary frequency.  ENT: She has had some change in her hearing recently.  MUSCULOSKELETAL:  She has a history of arthritis.  PSYCHIATRIC:  She has a history of depression.  HEMATOLOGY:  She had no bleeding problems or clotting disorders.   PHYSICAL EXAMINATION:  This is a pleasant 64 woman who appears her  stated age.  Blood pressure 110/66, heart rate is 77, saturation 97%.  HEENT:  Unremarkable.  Lungs:  Clear bilaterally to auscultation without  rales, rhonchi or wheezing.  Cardiovascular:  She has bilateral  carotid  bruits.  She has a regular rate and rhythm.  She has palpable femoral  pulses and warm well-perfused feet.  She has no significant peripheral  edema.  Abdomen:  Soft and nontender with normal pitched bowel sounds.  Musculoskeletal:  No major deformities or cyanosis.  Neurologic:  She  has no focal weakness or paresthesias.  Skin: There are no ulcers or  rashes.   I have independently interpreted her carotid duplex scan today in the  office which shows a 60%-79% right carotid stenosis with a greater than  80% left carotid stenosis.  Both vertebral arteries are patent with  normally directed flow.  She appears to have a normal internal carotid  artery past the stenosis in the bifurcation at the mid  hyoid level.    Given the severity of the left carotid stenosis, I have recommended left  carotid endarterectomy in order to lower her risk of future stroke.  We  have discussed the indications for this procedure and potential  complications including but not limited to bleeding, wound healing  problems, stroke (periprocedural risk 1%-2%), nerve injury, MI, or other  unpredictable medical problems.  All questions were answered.  She is  agreeable to proceed.  Her surgery has been scheduled for 07/18/2010.  She knows to continue her aspirin right up through surgery.     [name], M.D.  Electronically Signed   CSD/MEDQ  D:  07/05/2010  T:  07/06/2010  Job:  [id number]   cc:   Thressa Sheller, M.D.

## 2011-03-13 NOTE — Discharge Summary (Signed)
NAMEMarland Kitchen  ELENNA, POZZA NO.:  [id number]   MEDICAL RECORD NO.:  [id number]          PATIENT TYPE:  INP   LOCATION:  [id number]                         FACILITY:  [facility]   PHYSICIAN:  Vernell Leep, MD     DATE OF BIRTH:  11/02/1945   DATE OF ADMISSION:  03/29/2007  DATE OF DISCHARGE:                               DISCHARGE SUMMARY   Interim Discharge Summary   DISCHARGE DIAGNOSES:  1. Enteritis of unclear etiology - infectious versus inflammatory      versus ischemic.  2. Small bowel and colonic ileus.  3. Acute renal failure.  4. Hypokalemia.  5. Hypertension.  6. Leukocytosis.  7. Questionable pancreatitis.  8. Questionable enterococcal urinary tract infection.  9. Pneumonia.  10.Anemia.  11.Hypothyroidism.  12.Chronic steroid use.  13.Chronic pain/fibromyalgia.  14.Depression.  15.History of hyperthyroidism.  16.No code blue.  17.Rhabdomyolysis.   DISCHARGE MEDICATIONS:  Dictated at the time of discharge.   PROCEDURES:  1. Portable abdominal x-ray on April 01, 2007.  Impression is bowel gas      pattern most consistent with ileus type pattern.  No significant      change.  2. On March 31, 2007, also portable abdominal x-ray.  Impression is gas-      filled colon in the small bowel likely representing diffuse ileus.  3. On Mar 28, 2007, a chest x-ray.  Impression is worsening bibasilar      atelectasis versus scarring.  4. On Mar 29, 2007, an abdominal CT without contrast.  Impression is      no acute abdominal findings, no evidence of colitis, renal cortical      thinning with mild pelvocaliectasis on the right, probable      bibasilar pneumonia.  5. On Mar 29, 2007, a pelvic CT without contrast.  Impression is      diffuse and small bowel wall thickening in the pelvis, nonspecific      but suspicious for small bowel ischemia or infectious enteritis.      There is no evidence of abscess or bowel obstruction.  6. On Mar 28, 2007, an abdominal x-ray.   Impression is nonspecific and      nonobstructive bowel gas pattern with gas filled but non-dilated      loops of small bowel noted.  7. On Mar 29, 2007, a chest x-ray.  Impression is right-sided PICC      line dominated low SVC without pneumothorax, slight worsening in      bibasilar areas likely due to scar or atelectasis.  Especially on      the left early infection could look similar.   PERTINENT LAB RESULTS:  Hemoglobin A1C of 5.9, free T3 of 1.5, free T4  of 0.75, TSH of 0.582.  A urine culture is 35,000 colonies of  enterococcal species.  C. diff x1 is negative.  Blood cultures so far  are negative.   CONSULTATIONS:  1. General Surgery from Dr. Grandville Silos and Dr. Johney Maine.  2. GI from Dr. Penelope Coop and Dr. Amedeo Plenty of [place]:  For details of the initial  part of the admission,  please refer to the history and physical note that was done by Dr. Marye Round  on Mar 28, 2007.  In summary, Ms. Slupski is a 65 year old female patient  with a prior history of recurrent urinary tract infections who was  recently on oral ciprofloxacin, fibromyalgia with chronic pain syndrome,  prior history of acute renal failure, hypertension, hyperthyroidism,  chronic steroid use who presented with history of acute on chronic  diarrhea, altered mental status with decreased responsiveness.  On  further evaluation in the emergency room, she was noted to be  tachycardic, tachypneic, initially normotensive but became hypotensive,  stuporous, appearing ill with abdominal tenderness.  Further evaluation  revealed acute renal failure with BUN of 39, creatinine of 4, potassium  of 5.3, and a bicarb of 17 with a pH of 7.2, PCO2 of 38, PO2 of 103,  bicarb of 14, and urinalysis had positive nitrates and myoglobin greater  than 500.  Whereby the patient was assessed to have sepsis or septic  shock probably secondary to C. diff colitis and was transferred to [facility] from [facility] for  admission to the ICU.  1. Septic shock.  The patient was admitted to the ICU.  Cultures were      drawn.  The patient was resuscitated with IV fluids and IV      hydrocortisone with which her blood pressures normalized.  She did      not require pressors.  She was placed initially only on IV Flagyl,      but subsequently the antibiotic coverage was broadened to include      IV Zosyn.  So far, only the urine culture is positive for      Enterococcus, but the colonies are not substantial.  However, the      chest x-ray is reflective of pneumonia.  The patient has enteritis      on a CT of the abdomen.  2. Enteritis, the etiology of which is unclear.  It may be infectious      versus inflammatory versus ischemic.  As indicated, the patient's      bowel has been rested with n.p.o. except sips and chips, IV fluid      hydration aggressively, broad spectrum antibiotics of Zosyn and      Flagyl.  Initially on the next day of admission, to rule out an      acute abdomen, a CT of the abdomen was done, however without      contrast secondary to renal failure with findings as above.  A      surgical consult was obtained. She is being  monitored closely and      being managed non-surgically.  A GI consult also has been obtained,      and at some point the patient will require a CT angiogram of the      abdomen to rule out mesenteric ischemia.  3. Small bowel and colonic ileus.  Since yesterday, the patient's      abdomen has been mildly extended, whereby an x-ray of the abdomen      was obtained which revealed ileus, following which a rectal tube      was placed with minimal output through it.  The patient does not      have any vomiting, so the patient does not have an NG tube.  Her      electrolytes are being repleted in hope for resolution of ileus.  4. Acute renal failure probably secondary to her dehydration.  The      patient was hydrated aggressively following which she has been       diuresing adequately, so it is nonoliguric renal failure with renal      functions progressively continuing to improve with today's      creatinine in the 1.3 range.  5. Hypokalemia probably secondary to polyuric phase of acute renal      failure and ATN.  Hypokalemia has been repleted and corrected.  6. Hypertension.  Initially, the patient was hypotensive as indicated      above.  However, subsequently, the patient has now become      hypertensive following which the patient was initially started on      Clonidine which has been titrated upwards.  7. Leukocytosis secondary to her sepsis.  8. Questionable pancreatitis management per problem #2.  9. Questionable enterococcal UTI.  However, the colony count is not      substantial.  The Zosyn should cover this, too.  10.Pneumonia.  Clinically, the patient is asymptomatic of any cough or      difficulty breathing.  Continue on Zosyn.  11.Anemia.  The patient dropped her hematocrit to 8.2 on June 1st with      no source of overt bleeding, following which the patient was      transfused two units of packed red blood cells with an appropriate      response.  12.Chronic pain syndrome and fibromyalgia.  The patient has been      placed on a fentanyl patch and p.r.n. Dilaudid.  Even with this,      the patient intermittently has pains.  13.Chronic steroid use.  The patient's cortisol levels were okay, and      she is continued on IV hydrocortisone.  14.History of hyperthyroidism.  The patient's propylthiouracil was      being taken at the low dose of 50 mg orally daily which has been      held secondary to her being in n.p.o. status.  Her thyroid      functions are as above.  I have discussed her case with an      endocrinologist who suggests that since the patient is on a very      low dose of propylthiouracil and her thyroid function tests are as      above, we could probably hold her PTU for a few days, or she may      even be in remission  and might not need it.  This has to be      restarted as soon as the patient starts taking p.o.  15.Rhabdomyolysis which was probably secondary to her acute renal      failure and dehydration which has progressively improved with IV      hydration.  16.History of depression.  The patient apparently has been indicating      that she would not like to live with her chronic pain especially      after the demise of her husband.  However, the patient in the ICU      has been somnolent initially and has awakened in the last couple of      days.  She has not expressed any suicidal or homicidal ideations or      any delusions or hallucinations.  To follow this up and consider a      psychiatric consult at some point.  Vernell Leep, MD  Electronically Signed     AH/MEDQ  D:  04/01/2007  T:  04/01/2007  Job:  [id number]   cc:   Merri Ray. Grandville Silos, M.D.  Adin Hector, MD  Wonda Horner, M.D.  John C. Amedeo Plenty, M.D.  Michael Litter, M.D.

## 2011-03-13 NOTE — H&P (Signed)
NAME:  [name], MECHLING NO.:  [id number]   MEDICAL RECORD NO.:  [id number]          PATIENT TYPE:  EMS   LOCATION:  ED                           FACILITY:  [facility]   PHYSICIAN:  Edythe Lynn, M.D.       DATE OF BIRTH:  1946/09/04   DATE OF ADMISSION:  03/28/2007  DATE OF DISCHARGE:                              HISTORY & PHYSICAL   The patient's primary care physician is Dr. Thomos Lemons.   CHIEF COMPLAINTS:  Diarrhea.   HISTORY OF PRESENT ILLNESS:  Mrs. Duskey is a 65 year old woman with  history of recurrent urinary tract infections, who recently received  ciprofloxacin for one of these infections, who was brought by her family  after she became poorly responsive.  For the past 2 weeks, the patient  has been having massive diarrhea that has been poorly responsive to  conservative treatment.  The patient has recently been started on  cholestyramine but she has been unable to tolerate the medication.  Mrs.  Gohlke has been taking all the other medications as prescribed.  The  patient herself cannot provide history but she is accompanied to the  emergency room by her sister and her daughter who are providing the  history.   PAST MEDICAL HISTORY:  1. Asthma.  2. Fibromyalgia.  3. Hypertension.  4. Cholecystectomy.  5. Anemia.  6. B12 deficiency.  7. Billroth surgery.  8. Hypothyroidism.  9. Cushing syndrome.   FAMILY HISTORY:  Positive for diabetes and hypertension.   SOCIAL HISTORY:  The patient is an ongoing tobacco user, does not drink  alcohol.   DRUG ALLERGIES:  No known drug allergies.   HOME MEDICATIONS:  Include Levsin, Cipro, Vistaril, Lasix, Phenergan,  PTU, Prozac, Klonopin, Percocet, prednisone, enalapril,  hydrochlorothiazide, potassium chloride, Nexium, Robinul, iron and  cholestyramine.   REVIEW OF SYSTEMS:  Unobtainable.  The patient is too lethargic to  respond.   PHYSICAL EXAMINATION:  Shows a temperature of 98.5, pulse of 119,  respirations 22, blood pressure 117/47, saturation 98% on room air.  GENERAL APPEARANCE:  A stuporous patient with altered mental status,  lying in bed appearing acutely ill.  HEENT:  Head is normocephalic, atraumatic.  Eyes have pupils equal,  round and react to light and accommodation.  Extraocular movements  intact but the patient is very sluggish to respond.  She has a hard time  opening her eyes.  She needs a lot of prompting from her daughter.  Throat with dry buccal mucosa.  NECK:  No JVD.  CHEST:  Clear to auscultation without wheezes, rhonchi or crackles.  HEART:  Tachycardiac, regular without murmurs, rubs or gallops.  ABDOMEN:  The patient's abdomen is soft. The patient abdominal exam is  tenderness.  She has guarding and no bowel sounds.  LOWER EXTREMITIES:  Cyanotic and cold.  The skin is pale and clammy.   LABORATORY VALUES:  Sodium 137, potassium 5.3, chloride 106, bicarb 17,  BUN 39, creatinine 4, glucose 107.  A pH 7.2, pCO2 of 38, pO2 103,  bicarb 14.  White blood cell count 15, hemoglobin 11,  platelet count  375.  Urinalysis is positive for nitrites, myoglobin is more than 500.   ASSESSMENT/PLAN:  1. Sepsis/septic shock:  Mrs. Kin presents in profound dehydration,      acute renal failure, sepsis syndrome.  The source of her sepsis is      probably Clostridium difficile colitis, although another kind of      infectious diarrhea is possible.  The patient also may have a      pyelonephritis/urinary tract infection, although at this point in      time given the history, I highly suspect Clostridium difficile      colitis.  The patient will be admitted to intensive care unit,      placed on intravenous fluids, intravenous Flagyl and stress dose      steroids as she is a chronic prednisone user.  The patient's family      does not desire heroic measures and the patient will not be      intubated and not be placed on pressors.  The further course of      action will be  dependent on how the patient responds to the initial      treatment.  The lactic acid level and cortisol level has been      obtained and is pending.  2. Multiple chronic medical problems:  For now, all the patient's      blood pressure medications, thyroid medications, anxiety      medications have to be held as she is unable to take any p.o. If      the patient will get better, these will be resumed.      Edythe Lynn, M.D.  Electronically Signed     SL/MEDQ  D:  03/29/2007  T:  03/29/2007  Job:  [id number]   cc:   Michael Litter, M.D.  Fax: [phone]

## 2011-03-13 NOTE — Op Note (Signed)
NAMEMarland Kitchen  [name], [name] NO.:  [id number]   MEDICAL RECORD NO.:  [id number]          PATIENT TYPE:  INP   LOCATION:  3017                         FACILITY:  [facility]   PHYSICIAN:  John C. Amedeo Plenty, M.D.    DATE OF BIRTH:  20-Jan-1946   DATE OF PROCEDURE:  04/04/2007  DATE OF DISCHARGE:                               OPERATIVE REPORT   PROCEDURE:  Flexible sigmoidoscopy with biopsy.   INDICATIONS FOR PROCEDURE:  Acute superimposed on chronic diarrhea in a  hospitalized patient.  The procedure is to rule out microscopic colitis  or C.  difficile negative pseudomembranous colitis.   DESCRIPTION OF PROCEDURE:  The patient was placed in the left lateral  decubitus position and placed on the pulse monitor with continuous low  flow oxygen delivered by nasal cannula.  She had received 100 mcg IV  fentanyl and 7.5 mg IV Versed and no further sedation was required for  this procedure.  The Olympus video colonoscope was inserted into the  rectum and advanced to about 25 cm. There was some loose to runny stool  that had to be suctioned to allow adequate visualization of this area. I  did not attempt to pass the scope beyond 25 cm. The mucosa in all areas  appeared normal with no masses, polyps, diverticula or any visible  inflammatory changes. Specifically, no pseudomembranes were seen. Cold  biopsies were taken of the rectum and the rectosigmoid to rule out  microscopic colitis.  The scope was then withdrawn and the patient  returned to the recovery room in stable condition.  She tolerated the  procedure well.  There were no immediate complications.   IMPRESSION:  Normal study to 25 cm.   PLAN:  Await upper and lower endoscopic biopsies.  We will try her  empirically on a course of an antispasmodic for diarrhea and abdominal  pain.           ______________________________  Elyse Jarvis. Amedeo Plenty, M.D.     JCH/MEDQ  D:  04/04/2007  T:  04/04/2007  Job:  [id number]   cc:   Jeryl Columbia, M.D.

## 2011-03-13 NOTE — Discharge Summary (Signed)
NAMEMarland Kitchen  Lynn, [name] NO.:  [id number]   MEDICAL RECORD NO.:  [id number]          PATIENT TYPE:  INP   LOCATION:  5114                         FACILITY:  [facility]   PHYSICIAN:  Neysa Bonito, MD  DATE OF BIRTH:  1946/04/18   DATE OF ADMISSION:  03/29/2007  DATE OF DISCHARGE:                               DISCHARGE SUMMARY   PRIMARY CARE PHYSICIAN:  Thomos Lemons, M.D.   CHIEF COMPLAINT ON PRESENTATION:  Diarrhea.   HISTORY OF PRESENT ILLNESS:  Please refer to the H&P dictated on the day  of admission for further details regarding the H&P.   HOSPITAL COURSE:  Please refer to the previously dictated discharge  summary by Dr. Vernell Leep on April 01, 2007.  The hospital course for  the last week or so was uneventful with slow improvement and progression  in her overall health.  The patient was still complaining of diarrhea  and she was erythromycin and Flagyl, as well as Flora-Q.  The diarrhea  showed mild improvement during the last week of her hospitalization.  Please refer to the operative report done on April 04, 2007 by Dr. Amedeo Plenty.   The patient has a history of hyperthyroidism and she was taking PTU  before her admission to the intensive care unit.  The patient now is not  taking the PTU, but her thyroid function test seems to be acceptable and  within the normal range.  I would recommend follow-up of the thyroid  function tests for the decision to put the patient back on her PTU.   The patient also was taking anti depression medication before her  admission, and I am going to start her on the Zoloft today to adjust the  medication as needed.   DISCHARGE DIAGNOSES:  This is included in the previously dictated  discharge summary; please refer to that.   DISCHARGE MEDICATIONS:  This will be dictated on the actual discharge  date.   DISCHARGE/PLAN:  1. The patient was seen by rehab, and she is not suitable for      inpatient rehab at this time.  We will  continue physical therapy,      and we will consider the patient's admission to a skilled nursing      facility for rehab purposes for a short period of time.  The      patient and her daughter were agreeable with this plan, and we will      go forward with her discharge to a skilled nursing facility.  2. Fluid retention.  She has edema in the upper and lower extremities,      and we are treating it with Lasix and potassium supplementation.      The patient's fluid restriction is not really helpful, and she is      drinking more than actually diuresed at this time.  But the plan is      to get her as dry as possible before discharge.      Neysa Bonito, MD  Electronically Signed     EME/MEDQ  D:  04/15/2007  T:  04/15/2007  Job:  [id number]

## 2011-03-13 NOTE — Discharge Summary (Signed)
NAMEMarland Kitchen  [name], [name] NO.:  [id number]   MEDICAL RECORD NO.:  [id number]          PATIENT TYPE:  INP   LOCATION:  [id number]                         FACILITY:  [facility]   PHYSICIAN:  Barbette Merino, M.D.      DATE OF BIRTH:  1945/12/26   DATE OF ADMISSION:  03/29/2007  DATE OF DISCHARGE:  04/17/2007                               DISCHARGE SUMMARY   ADDENDUM:   PRIMARY CARE PHYSICIAN:  Chief Financial Officer.   DISCHARGE DIAGNOSES:  For discharge diagnoses and other diagnoses, see  the previous discharge summary dictated on June 3 as well as April 15, 2007 by Dr. Lydia Guiles.   DISCHARGE MEDICATIONS:  1. Cymbalta 60 mg daily.  2. Xanax 0.25 mg b.i.d.  3. Norvasc 5 mg daily.  4. Aspirin 81 mg daily.  5. Clonidine 0.2 mg patch every week.  6. Bentyl 20 mg twice daily.  7. Fentanyl patch 50 mcg daily.  8. Flora-Q 1 capsule daily as needed.  9. Lasix 20 mg daily.  10.Topical Lidoderm 1 patch daily.  11.Protonix 40 mg daily.  12.Actos 45 mg daily.  13.Potassium chloride 20 mEq daily.  14.Prednisone 40 mg daily.   DISPOSITION:  Patient will be discharged to skilled nursing facility in  the morning of April 18, 2007.  She has some problem with depression so  far that has been managed before, using Prozac as well as Cymbalta.  She  currently prefers to get Cymbalta, and we are giving her some Cymbalta  prior to discharge.  All other medical issues remain the same, as per  previous discharge summary.      Barbette Merino, M.D.  Electronically Signed     LG/MEDQ  D:  04/17/2007  T:  04/17/2007  Job:  [id number]

## 2011-03-16 NOTE — H&P (Signed)
NAME:  [name], [name] NO.:  [id number]   MEDICAL RECORD NO.:  [id number]                   PATIENT TYPE:  INP   LOCATION:  4740                                 FACILITY:  [facility]   PHYSICIAN:  [name], M.D.            DATE OF BIRTH:  08-Feb-1946   DATE OF ADMISSION:  03/05/2003  DATE OF DISCHARGE:  03/06/2003                                HISTORY & PHYSICAL   CHIEF COMPLAINT:  Chest pain.   HISTORY OF PRESENT ILLNESS:  The patient states that while visiting her  husband at approximately eight, she was getting up from her wheelchair to  the bed when she noticed some retrosternal chest pain.  She described this  as approximately an 8/10 in intensity, sharp pain which radiates directly to  her back and also to her right neck.  She states that the pain was not  abated when she took some Rolaids and also after use of Maalox.  Because of  persistent pain, she was seen by the nursing staff there and told to come to  the emergency room for further attention.  The pain started to abate in the  triage and appeared to resolve on its own.  The entire episode lasted  approximately 30 minutes by her estimation.  She denies any shortness of  breath,  there was no nausea, no vomiting, palpitations.  There was no  fever, chills, or diaphoresis.   PAST MEDICAL HISTORY:  1. Probable toxic metabolic encephalopathy for which she was recently     admitted in January of 2004.  2. Hyperthyroidism/Graves' disease with ophthalmopathy and probable     dermopathy.  3. Gastric outlet obstruction status post resolution with surgical     treatment.  4. Elevated serum gastrin with no apparent evidence of gastrinoma.  5. Pernicious anemia on B12 replacement.  6. Fibromyalgia syndrome.  7. 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   FAMILY HISTORY:  There is a history of diabetes in multiple family members  with no evidence of early coronary artery  disease.   HABITS:  She smokes and but does not abuse alcohol.   SOCIAL HISTORY:  She lives at home with her husband.   ALLERGIES:  She has intolerance to VIOXX which causes nausea.   REVIEW OF SYSTEMS:  All systems reviewed.  Pertinent positives as noted  above.   PHYSICAL EXAMINATION:  VITAL SIGNS:  The patient is afebrile.  Pulse rate is  78, respiratory rate is 14.  HEENT:  Pupils are equal, round, reactive to light and accommodation.  Extraocular movements are intact.  Mucosa are intact.  NECK:  Supple without evidence of JVD or bruit.  CARDIOVASCULAR:  S1 and S2 normal.  There is no murmurs, rubs, or gallops.  ABDOMEN:  Benign.  EXTREMITIES:  No pedal edema.  NEUROLOGIC:  The patient is fluent, has normal gait.   LABORATORY DATA:  CBC and  BMP are grossly normal.  There is a mild elevation  of her potassium to 5.8.   Cardiac enzymes:  CK is found to be negative at 44.   EKG is normal.   ASSESSMENT AND PLAN:  1. Chest pain.  Unfortunately, serum troponin level has not been carried out     by the emergency room.  We will initiate this lab test and will also     admit to obtain second set of CK and troponin.  Will consider discharge     in the morning if stable.  2. Hyperkalemia.  Will follow.                                               [name], M.D.    AR/MEDQ  D:  03/05/2003  T:  03/08/2003  Job:  [id number]

## 2011-03-16 NOTE — Discharge Summary (Signed)
NAMEMarland Young  IYSIS, ABEGG NO.:  [id number]   MEDICAL RECORD NO.:  [id number]          PATIENT TYPE:  INP   LOCATION:  5708                         FACILITY:  [facility]   PHYSICIAN:  Cherene Altes, M.D.DATE OF BIRTH:  1946/08/07   DATE OF ADMISSION:  02/11/2006  DATE OF DISCHARGE:  02/21/2006                                 DISCHARGE SUMMARY   DISCHARGE DIAGNOSIS:  1.  Acute pyelonephritis - Enterobacter cloacae susceptible to Cipro.  2.  Acute renal failure - pyelonephritis and pre-renal associated -      resolved.  3.  Acute cholecystitis - medically stabilizing - for outpatient/elective      cholecystectomy.  4.  Iron deficiency anemia - hemoglobin 8.5 with MCV 82, NuIron initiated,      needs outpatient colonoscopy.  5.  Hypertension.  6.  Severe dehydration - corrected.  7.  Neuropathy.  8.  Pernicious anemia on B12 therapy.  9.  Chronic steroid therapy for inflammatory disease not otherwise      specified.  10. Reported history of asthma.  11. Status post surgical treatment for gastric outlet obstruction with a      possible Billroth I procedure.  12. Fibromyalgia per patient history.   DISCHARGE MEDICATIONS:  1.  Protonix 40 mg b.i.d.  2.  Prednisone 20 mg daily.  3.  Atenolol 25 mg daily .  4.  Claritin 10 mg daily.  5.  Ciprofloxacin 500 mg b.i.d.  6.  Flagyl 500 mg t.i.d.  7.  Prozac 20 mg daily.  8.  NuIron 150 mg b.i.d.  9.  Phenergan 25 mg q.4h. p.r.n.   FOLLOW UP:  1.  The patient is instructed to follow up with Dr. Justine Null in 5-7 days for      routine medical recheck.  Consideration should be given to scheduling      outpatient colonoscopy once the patient is stabilized, see discussion      below.  2.  The patient is scheduled to see Dr. Georganna Skeans with [facility] on May 3 at 2 p.m. for re-evaluation and possible scheduling of      outpatient/elective cholecystectomy.   CONSULTATIONS:  Dr. Georganna Skeans with  [facility].   HISTORY OF PRESENT ILLNESS:  Ms. [name] is a 65 year old female with  a complicated medical history who presented to the hospital complaining of  severe nausea and vomiting x 2 days associated with diarrhea.  She is unable  to keep anything down.  She was found to be extremely dehydrated.  She was  admitted to the hospital after she was diagnosed with acute cholecystitis,  acute pyelonephritis, and acute renal failure.   HOSPITAL COURSE:  The patient was initially evaluated in the emergency room  by the surgical service.  Diagnosis of acute cholecystitis was confirmed but  it was felt the patient was not medically stable for acute surgical  intervention due to the patient's acute renal failure, runs of  hypotension,  and severe pyelonephritis.  The patient was, therefore, admitted to the  hospitalist service.  Intravenous broad spectrum antibiotic was initiated.  Urine culture was obtained.  Enterobacter cloacae was isolated on the  patient's urine culture.  This has proven to be ciprofloxacin sensitive.  Cipro and Flagyl were the utilized agents to provide coverage both for the  pyelonephritis as well as the cholecystitis.  Fortunately with antibiotic  therapy, clinical signs and symptoms of pyelonephritis resolved.  The  patient's acute renal failure was, in fact, severe at the time of her  admission.  Creatinine at that time was noted to be 4.6 with no prior  history of renal dysfunction.  The urinary output did respond with IV  hydration.  All nephrotoxic agents were avoided.  With treatment of  pyelonephritis and aggressive IV fluid resuscitation, the patient's renal  function normalized.  At the time of discharge, her creatinine is 1.  Unfortunately, despite success in reversing the patient's acute renal  failure and despite appropriate therapy for the patient's pyelonephritis,  she was left significantly debilitated from her acute illness.   Fortunately,  she had stabilized from the standpoint of her acute cholecystitis.  It was  felt best to allow the patient time to recuperate and regain strength prior  to proceeding with this semi-elective surgery.  Dr. Grandville Silos has agreed to  see the patient in the outpatient setting to schedule surgery.  Physical  therapy and occupational therapy were consulted and did evaluate the patient  in the hospital because of her generalized weakness.  The patient was deemed  to be an appropriate candidate for an inpatient rehab stay.  She was  counseled as to the appropriateness of this by her attending physician.  The  patient, however, feels that such a stay would simply worsen my  fibromyalgia and she has adamantly refused.  She has been advised that we  feel that she would be safest to be under 24 hour observation.  She states  that she will request her family assist her.   During the hospital stay, a significant anemia was also appreciated.  She  has a hemoglobin that is stable around 8 to 8.5.  This is a normocytic  anemia.  B12, folate, and iron studies were all obtained.  It is noted that  the patient is on B12 supplementation via IM dosing.  It is also noted the  patient has had a gastric outlet obstruction with a probable Billroth I  reconstruction.  It is possible that the patient is suffering with iron  absorption issues.  Nonetheless, in a 65 year old female with a clear iron  deficiency, we cannot rule out the possibility of an occult GI malignancy.  It is recommended that this be followed in the outpatient setting and the  patient be scheduled for screening colonoscopy if this has not been done  recently.   The patient has multiple other chronic medical problems to include  hypertension, hyperthyroidism, possible fibromyalgia, and reported history  of asthma.  Fortunately, these were stable during her hospitalization.  At the time of discharge, the patient's vital signs were  stable.  She is  afebrile.  She was tolerating a strict low fat diet.  O2 saturations were in  the 95% range on room air.      Cherene Altes, M.D.  Electronically Signed     JTM/MEDQ  D:  02/20/2006  T:  02/20/2006  Job:  [id number]   cc:   Kristen Loader, M.D.   Merri Ray Grandville Silos, M.D.  [address]  [place], [place] [address]

## 2011-03-16 NOTE — Op Note (Signed)
NAME:  [name], [name]               ACCOUNT NO.:  [id number]   MEDICAL RECORD NO.:  [id number]          PATIENT TYPE:  AMB   LOCATION:  SDS                          FACILITY:  [facility]   PHYSICIAN:  Merri Ray. Grandville Silos, M.D.DATE OF BIRTH:  [birth year]   DATE OF PROCEDURE:  04/12/2006  DATE OF DISCHARGE:                                 OPERATIVE REPORT   PREOPERATIVE DIAGNOSIS:  History of cholecystitis.   POSTOPERATIVE DIAGNOSIS:  History of cholecystitis.   PROCEDURE:  Laparoscopic cholecystectomy with intraoperative cholangiogram.   SURGEON:  Georganna Skeans, M.D.   ASSISTANT:  Edsel Petrin. Dalbert Batman, M.D.   ANESTHESIA:  General.   HISTORY OF PRESENT ILLNESS:  The patient is a 65 year old female who had a  recent hospitalization for pyelonephritis and acute cholecystitis.  These  were both treated medically and she has been at home.  She has had no  further right upper quadrant pain though she has had some intermittent  nausea.  She presents today for elective cholecystectomy.   PROCEDURE IN DETAIL:  Informed consent was obtained.  The patient received  intravenous antibiotics.  She was brought to the operating room.  General  anesthesia was administered.  Her abdomen was prepped and draped in sterile  fashion.  The infraumbilical region was infiltrated with 0.25% Marcaine with  epinephrine.  Infraumbilical incision was made.  Subcutaneous tissues were  dissected down revealing the anterior fascia.  This was then divided sharply  and the peritoneal cavity was entered under direct vision without  difficulty.  Some adhesions from her previous surgery which included a  Billroth I were swept away and the Hasson trocar was inserted into the  abdomen.  The abdomen was insufflated with carbon dioxide in standard  fashion.  Under direct vision an 11 mm epigastric and two 5-mm lateral ports  were placed.  The epigastric port was placed a little more laterally than  usual due to her previous  surgery.  0.25% Marcaine with epinephrine was used  at all port sites.  The dome of the gallbladder was then retracted superior  medially.  There were a lot of dense omental adhesions were swept down off  the gallbladder in gradual meticulous fashion, getting good hemostasis with  Bovie cautery.  This gradually revealed the infundibulum and further careful  dissection allowed the infundibulum to be retracted inferolaterally.  Dissection began laterally and progressed medially easily identifying the  cystic duct.  Dissection continued until a large window was made between the  cystic duct, the infundibulum and the liver.  Once this was accomplished  with excellent visualization, a clip was placed on the infundibulocystic  duct junction.  A small nick was made in the cystic duct and a Reddick  cholangiogram catheter was inserted.  Intraoperative cholangiogram was  obtained which demonstrated no common bile duct filling defects and good  flow of contrast was seen into the duodenum. Cholangiogram catheter was  removed.  Three clips were placed proximally on the cystic duct and it was  divided.  Further dissection revealed the cystic artery.  This was clipped  twice proximally, once  distally and divided.  A likely aberrant right  hepatic artery was also visualized.  We stayed far away from this with our  dissection.  The gallbladder was then taken off the liver bed with Bovie  cautery.  A posterior branch of cystic artery was encountered.  This was  clipped twice proximally and divided distally with cautery.  Gallbladder was  removed from the liver bed and placed in EndoCatch bag and taken out of the  abdomen via the infraumbilical port site.  The liver bed was then cauterized  to get excellent hemostasis.  The abdomen was copiously irrigated.  The  irrigation fluid returned clear.  We rechecked the liver bed and it was dry.  The remainder of irrigation fluid was evacuated and it was clear.  The  ports  removed under direct vision.  The pneumoperitoneum was released.  The Hasson  trocar was removed.  The infraumbilical fascia was closed by tying the 0  Vicryl pursestring suture with care not to trap any intra-abdominal  contents.  All four wounds were copiously irrigated.  Some additional local  anesthetic was injected and the skin of each was closed with running 4-0  Vicryl subcuticular stitch.  Sponge, needle and instrument counts were  correct.  Benzoin, Steri-Strips and sterile dressings were applied.  The  patient tolerated procedure well without apparent complication was taken  recovery in stable condition.      Merri Ray Grandville Silos, M.D.  Electronically Signed     BET/MEDQ  D:  04/12/2006  T:  04/12/2006  Job:  [id number]   cc:   Michael Litter, M.D.  Fax: [phone]

## 2011-03-16 NOTE — Discharge Summary (Signed)
   NAME:  [name], [name] NO.:  [id number]   MEDICAL RECORD NO.:  [id number]                   PATIENT TYPE:  INP   LOCATION:  29                                 FACILITY:  [facility]   PHYSICIAN:  Lynnell Chad. Shelia Media, M.D.               DATE OF BIRTH:  04-01-1946   DATE OF ADMISSION:  03/05/2003  DATE OF DISCHARGE:  03/06/2003                                 DISCHARGE SUMMARY   HOSPITAL COURSE:  Please see history and physical for details of the  patient's presentation.  Briefly, she was admitted by  Dr. Ashby Dawes for sudden chest pain lasting 29 minutes.  It radiates to  the back and the neck, similar to prior episode of gastric pain.  No relief  with Rolaids or Maalox was noted.  She was admitted to telemetry.  Chest x-  ray showed basilar fibrosis.  No acute disease.  EKG showed normal sinus  rhythm.  Hemoglobin was 13, repeat was 12.2.  White count was normal.  Differential was normal.  Initial potassium 5.4, repeat was 5.6.  Creatinine  was 1.2.  CK and CK-MB's were all normal.   IMPRESSION:  1. Atypical chest pain.  Discharged without problems.  2. Hyperkalemia.  Avoid high-potassium foods, repeat potassium level as an     outpatient.  3. History of toxic metabolic encephalopathy January 2004.  4. Hypothyroidism/Graves' disease with ophthalmopathy.  5. History of gastric outlet obstruction--status post surgical treatment.  6. History of elevated gastrin with no apparent gastrinoma.  7. Pernicious anemia.  The patient on chronic B12.  8. Fibromyalgia.  9. 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  10.      Family history of diabetes mellitus.   DISPOSITION:  She is discharged to home with same medications at admission  except for increase in potassium to twice a day.  She is to decrease her  potassium dose down to one every day and have her potassium level rechecked  when she sees Dr. Justine Null.   FOLLOW UP:  Follow up with Dr. Justine Null in one  week.                                               Lynnell Chad. Shelia Media, M.D.    WDP/MEDQ  D:  03/29/2003  T:  03/29/2003  Job:  [id number]   cc:   Michael Litter, M.D.  [address] [place]  [place] [address]  Fax: [phone]

## 2011-03-16 NOTE — Op Note (Signed)
NAMEMarland Kitchen  [name], BIBA NO.:  [id number]   MEDICAL RECORD NO.:  [id number]          PATIENT TYPE:  AMB   LOCATION:  ENDO                         FACILITY:  [facility]   PHYSICIAN:  Jeryl Columbia, M.D.    DATE OF BIRTH:  [birth year]   DATE OF PROCEDURE:  08/08/2004  DATE OF DISCHARGE:                                 OPERATIVE REPORT   PROCEDURE:  EGD with biopsy.   INDICATION:  Nausea, vomiting, abdominal pain.  Nondiagnostic work-up so  far.  History of gastric surgery.  Consent was signed after risks, benefits,  methods, options thoroughly discussed in the office.   MEDICINES USED:  1.  Fentanyl 50 mcg.  2.  Versed 7.   DESCRIPTION OF PROCEDURE:  The scope was inserted by direct vision.  The  esophagus was normal.  She did have a small hiatal hernia.  The scope passed  into the stomach, and an obvious amount of old food was in the proximal  stomach.  We could advance to the distal stomach which had a widely patent  anastomosis.  There was some nodular gastritis in the distal stomach.  The  scope passed easily into the small bowel.  Seemed to be a Billroth I  anastomosis, and we could advance a fair ways down the duodenum.  No  abnormalities were seen distally.  Scope was withdrawn back to the stomach.  On retroflexion, the hiatal hernia was confirmed.  We could not see much of  the proximal stomach due to the food.  We went ahead and took a few biopsies  in the distal stomach which was inflamed and slightly nodular and then used  the biopsy forceps to break up the food, although it could not be washed and  suctioned.  Air was suctioned.  We elected to slowly withdraw.  Again, a  good look at the esophagus was normal.  Scope was removed.  The patient  tolerated the procedure well, and there was no obvious immediate  complication.   ENDOSCOPIC DIAGNOSES:  1.  Small hiatal hernia.  2.  Food in the proximal stomach broken up with the biopsy forceps.  3.  Distal  gastritis, status post biopsy.  4.  Otherwise, within normal limits Billroth I anastomosis a fair ways down      the duodenum.   PLAN:  Await pathology.  Consider trials of Reglan or Zelnorm or continuing  work-up with a gastric emptying study or small-bowel follow-through, but we  will wait on the biopsies and see how she is doing postprocedure to decide  further work-up and plans.      MEM/MEDQ  D:  08/08/2004  T:  08/08/2004  Job:  [id number]   cc:   Michael Litter, M.D.  [address]  [place]  [place] [address]  Fax: [phone]

## 2011-03-16 NOTE — H&P (Signed)
NAME:  [name], [name] NO.:  [id number]   MEDICAL RECORD NO.:  [id number]          PATIENT TYPE:  EMS   LOCATION:  MAJO                         FACILITY:  [facility]   PHYSICIAN:  Barbette Merino, M.D.      DATE OF BIRTH:  06-Jan-1946   DATE OF ADMISSION:  02/11/2006  DATE OF DISCHARGE:                                HISTORY & PHYSICAL   PRESENTING COMPLAINT:  Dr. __________.   PRESENTING COMPLAINT:  Nausea and vomiting for two days.   HISTORY OF PRESENT ILLNESS:  The patient is a 65 year old female with  history of fibromyalgia, pernicious anemia with chronic B12 deficiency, also  Graves' disease.  She apparently has been doing okay until today.  She  started having nausea and vomiting two days ago and then diarrhea.  Since  then she has been weak gradually until she got to the state where she could  not stand.  She was unable to keep anything down adequately for two days  since symptoms started.  She denied any fever. She denies any hematochezia  or hematemesis.  She had some abdominal pain which was mainly in the central  region that has been persistent.  She was seen in the ED initially and  initial evaluation included a CT scan of the abdomen suggests proximal acute  cholecystitis.  Surgery was called due to her electrolyte findings,  especially that of acute renal failure.  They asked [place] to admit the patient  to tune her up before they could get any surgical intervention.   PAST MEDICAL HISTORY:  1.  Hyperthyroidism.  2.  Graves' disease.  3.  Fibromyalgia.  4.  Pernicious anemia with some gastric outlet obstruction.  5.  The patient is on chronic steroid therapy for some form of inflammatory      disease.  6.  Some dermatitis.  7.  Also history of hypertension.  8.  Asthma.  9.  Toxic metabolic encephalopathy in January 2004.  10. Previous hypokalemia with atypical chest pain.  11. Status post surgical treatment for gastric outlet obstruction.  12.  Persistently elevated gastrin levels with normal gastrin.  13. History of exogenous Cushing's syndrome secondary to steroid use.   ALLERGIES:  The patient has no known drug allergies.   MEDICATIONS:  The patient is on a number of medications but she is unsure of  the doses.  These include hyoscyamine, glycopyrrolate, hydroxyzine, Lasix,  promethazine, propylthiouracil, Prozac, metoclopramide, clonazepam, Vicodin,  prednisone, enalapril, hydrochlorothiazide, and potassium supplement.   SOCIAL HISTORY:  The patient smokes cigarettes, about half a pack per day.  No alcohol.  No IV drug use.   FAMILY HISTORY:  Mainly diabetes and hypertension.   REVIEW OF SYSTEMS:  A temporary review of systems is attempted. The patient  is a poor historian.   PHYSICAL EXAMINATION:  VITAL SIGNS:  Temperature 97.6 rectally, blood  pressure initially 103/84, currently 134/49, pulse 119, respiratory rate 20,  saturations 99% on room air.  GENERAL:  The patient looks acutely ill, very sickly, but no acute distress.  HEENT:  The patient has exophthalmos.  She has palpebral swelling  bilaterally.  Some conjunctival injections.  No gingival pallor, no  jaundice.  Edentulous.  NECK:  Supple.  No JVD, no lymphadenopathy.  RESPIRATORY:  Mild basilar crackles bilaterally.  CARDIOVASCULAR:  The patient is currently tachycardic.  ABDOMEN:  Full, distended, but soft with some mild gastric tenderness.  EXTREMITIES:  Multiple skin dermatitis changes, red and warm, but no edema,  cyanosis, or clubbing.   LABORATORY DATA:  Sodium 136, potassium 5.8, chloride 116, BUN 62, glucose  76.  Creatinine 4.6. White count 10, with a left shift, ANC 9.2, hemoglobin  11.5 with an MCV of 83.  Platelet count 355.  Urinalysis showed cloudy urine  with some moderate hemoglobin, large leukocyte esterase.  Urine wbc's 11-20,  rbc's 3-6, many bacteria.  Lipase is negative at 11.  Albumin 2.7, AST 36,  ALT 22, alkaline phosphatase 90,  total bilirubin 0.9.  CT scan of the  abdomen and pelvis shows findings consistent with possible cholecystitis.  Also possible right lower lobe infiltrate.   ASSESSMENT:  This is a 65 year old female presenting with acute renal  failure, severe dehydration.  Abdominal pain, with nausea, vomiting and  diarrhea. There is a questionable acute cholecystitis.  My impression is  that the patient has pyelonephritis.  This may have led to her nausea and  vomiting.  She continues to take Lasix, hydrochlorothiazide and an ACE  inhibitor and that combined led to her acute renal failure.  The patient may  have had acute cholecystitis but it is not reflected in her labs.  Her LFTs  are completely normal.  Her pain, too, though not severe, did not reflect an  incipient inflammatory process.  However, the patient is on chronic  steroids, which may have masked both her temperature and her pain  presentation.   PLAN:  1.  Acute renal failure.  This is most likely prerenal.  I will check spot      urine, sodium, and chloride.  Urine specific gravity was 1.022 which is      not very high.  Will also hydrate the patient extensively.  I will start      with a bolus of normal saline once her renal function is good.  We will      reconsult surgery for evaluation.  In the meantime, with a creatinine of      4.6 and BUN of 62, we will check a renal ultrasound especially in the      setting of her obvious UTI to to rule out any abscess or any mass or      obstructive feature.  If there is no improvement, I will consult      nephrology in the morning.  2.  Severe dehydration.  As indicated this must be from her nausea,      vomiting, and diarrhea.  Will hydrate her aggressively and then follow      her response while in the hospital.  3.  Possible acute cholecystitis.  This again will treat with IV Cipro and     Flagyl for now and I will await the patient's response.  Surgical      consult will be on board to  address that.  4.  Graves' disease.  The patient is currently on propylthiouracil.  She      will need a beta blocker, especially her heart rate continues to rise.  5.  Pyelonephritis.  As indicated, I will put the patient on some Cipro  IV      for now.  We will await urine culture and sensitivity for antibiotic      specificity.  6.  Hyperkalemia.  This is most likely caused by one, her acute renal      failure but more importantly, the patient is taking potassium      supplements, which we will hold.  In addition, we will hold other      medications like the diuretic she takes and the ACE inhibitor which      could worsened renal failure.  7.  Other medical problems will also be addressed accordingly, including her      pernicious anemia which may be renal or due to B12 deficiency .  Will      also keep the patient in the step-down unit for now until she shows some      major improvement.      Barbette Merino, M.D.  Electronically Signed     LG/MEDQ  D:  02/12/2006  T:  02/12/2006  Job:  [id number]

## 2011-03-16 NOTE — Discharge Summary (Signed)
NAME:  [name], [name] NO.:  [id number]   MEDICAL RECORD NO.:  [id number]                   PATIENT TYPE:  INP   LOCATION:  5727                                 FACILITY:  [facility]   PHYSICIAN:  Michael Litter, M.D.                    DATE OF BIRTH:  [birth year]   DATE OF ADMISSION:  11/10/2002  DATE OF DISCHARGE:  11/13/2002                                 DISCHARGE SUMMARY   FINAL DIAGNOSES:  1. Probable toxic metabolic encephalopathy, resolved.  2. 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  3. Gastric outlet obstruction postoperative with resolution.  4. Increased serum gastrin, no apparent evidence of gastrinoma or continued     gastric outlet obstruction.  5. Pernicious anemia on B12 replacement.  6. Fibromyalgia syndrome.  7. 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   HISTORY OF PRESENT ILLNESS:  The patient is a pleasant 65 year old woman we  have followed over a long time frame with a history of chronic fibromyalgia  complex who developed also gastric outlet obstruction probably from old  peptic disease, relieved by surgery, Rodman Key B. Hassell Done, M.D., about two  years ago.  She had substantially increased serum gastrin level over 1000  which has fallen postoperatively and had some postoperative jugular vein  thrombosis secondary to catheter insertion which resolved after a short  course of Coumadin.  She also developed subsequent thyroid ophthalmopathy  and was followed at [facility] and was found to be hyperthyroid with  Graves' disease and was treated with  PTU and is now followed by Ishmael Holter.  Forde Dandy, M.D., for that problem.  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   She has required, as mentioned, Avinza, which is sustained relief narcotic  as well as Neurontin and prednisone for pain control and has done somewhat  better over time but the last day or two has been more groggy and sleepy  apparently, according to her daughter, although easily arousable and fairly  lucid when awake.  She may not have taken her medication the last 36 hours  or so but that is not entirely clear.  She was found sleeping on the toilet  this morning when her husband called EMT and was brought to the emergency  room.   Evaluation in the emergency room revealed that she did have a temperature of  101.  She had no focal signs of infection, that is she had no abdominal  tenderness, no sign of pneumonia.  Her cardiac examination was fairly  benign.  She was somnolent but arousable.  She had no new focal neurologic  signs.  She did have palpable goiters before, had exophthalmus before and  had still some mild swelling of her legs but no evidence of DVT.   Admission laboratory data revealed that she had a sodium of 138, chloride  108, BUN 36, potassium 5.4, CO2 25, creatinine 1.7.  Hemoglobin 11.2, white  count elevated about 12,500 with increased polys, platelets were normal.  She did have white cells in her urine and was treated appropriately for a  possible urinary tract infection after cultures were obtained, although  cultures did not reveal any particular pathogen and she was admitted for her  confusional state.  It was felt that she most likely had a toxic metabolic  encephalopathy probably secondary to drug interaction or possibly fever  secondary to occult infection.   She had no infiltrate on chest x-ray or evidence of congestive failure.   HOSPITAL COURSE:  This was fairly benign in that she responded rather  promptly to fluid administration, reduction in narcotic medications and   holding her Neurontin.  By the third hospital day she seemed to be baseline.  CT scan of her head has found no infarct, stroke, or hemorrhage, although  she had perhaps left frontal infarct and EEG showed some nonfocal slowing  but no sign of epileptiform activity.  Chest x-ray was unremarkable.   Her white count at admission actually was 17,100 and fell to 11,000 by the  following day.  In spite of steroid boost, her hemoglobin was 11.7,  platelets 301,000.  The sed rate was 31.  The B12 level was low at 161,  although she was suppose to be receiving parenteral B12 and that will be  restarted.  Her sodium was 138, potassium 5.4, chloride 108, and CO2 21,  glucose 97, BUN 36 falling to 25 with hydration, creatinine 1.7 falling to  1.1 with hydration.  Calcium 8.3, total protein 6.7, albumin 3.8.  Liver  functions were normal.  CPKs were normal.  Her TSH was 0.214, T4 4.5.  Her  gastrin level was mildly elevated at 400.  Previous levels have been much  higher than that.  Her gabapentin level was 14.1 which is probably  moderately elevated.  Urine culture did show white cells but did not grow  any specific bacteria.  As mentioned, chest x-ray was unremarkable.   CONDITION ON DISCHARGE:  Clearly improved.   DISCHARGE MEDICATIONS:  1. Percocet one every four hours as needed for pain.  2. No further Avinza.  3. Prednisone 15 mg each morning to be tapered further.  4. Prevacid 30 mg twice a day.  5. PTU 50 mg once each day.  6. Prozac 40 mg a day.  7. Neurontin 300 mg one twice a day to be later tapered and discontinued.  8. Vaseretic 10/25 one half tablet twice a day.   FOLLOW UP:  She will see Dr. Justine Null in five to seven days.   DIET:  There was no dietary restriction.   DISPOSITION:  She was returned to home with her husband.  The patient is to  call if there is any further change in the status or any other new problem but at the moment her problem appears to be resolving without any  further  intervention except for altering medications.  She still requires further  follow-up for peculiar skin lesions, thyroid disease, and ophthalmopathy.  Michael Litter, M.D.    WTR/MEDQ  D:  12/08/2002  T:  12/08/2002  Job:  [id number]   cc:   Annie Main A. Forde Dandy, M.D.  [address]  [place]  [place] [address]  Fax: [phone]

## 2011-03-16 NOTE — Consult Note (Signed)
NAMEMarland Young  WYNDE, FAILS NO.:  [id number]   MEDICAL RECORD NO.:  [id number]          PATIENT TYPE:  EMS   LOCATION:  MAJO                         FACILITY:  [facility]   PHYSICIAN:  Merri Ray. Grandville Silos, M.D.DATE OF BIRTH:  Feb 25, 1946   DATE OF CONSULTATION:  02/12/2006  DATE OF DISCHARGE:                                   CONSULTATION   CHIEF COMPLAINT:  Abdominal pain and diarrhea.   HISTORY OF PRESENT ILLNESS:  Patient is a 65 year old white female who  complains of a two day history of right upper quadrant abdominal pain and  diarrhea.  She has had no emesis but some significant nausea.  Her pain  worsened overnight, and she came to the [facility] emergency department via  EMS for evaluation.  Workup included white blood cell count of 10,000,  urinalysis consistent with UTI, and chemistry panel consistent with acute  renal failure.  CT scan of the abdomen and pelvis was done which shows acute  cholecystitis.  The patient is a poor historian.   PAST MEDICAL HISTORY:  1.  Fibromyalgia.  2.  Hypertension.  3.  Seizure disorder.  4.  Asthma.   PAST SURGICAL HISTORY:  Ulcer surgery in 1999 by Dr. Hassell Done from our  practice.   CURRENT MEDICATIONS:  Doses are unavailable, but medications include  hyoscyamine, glycopyrrolate, hydroxyzine, Lasix, promethazine,  propylthiouracil, Prozac, metoclopramide, clonazepam, oxycodone, prednisone,  enalapril, hydrochlorothiazide, and Klor-Con.   ALLERGIES:  No known drug allergies.   REVIEW OF SYSTEMS:  GENERAL:  She feels weak.  CARDIAC:  Negative.  PULMONARY:  Negative.  GI:  See the history of present illness.  She notes  diarrhea every time she eats anything.  NEURO/PSYCH:  Negative acute.  MUSCULOSKELETAL:  Some leg pain from her fibromyalgia.   PHYSICAL EXAMINATION:  VITAL SIGNS:  Temperature 97.3, blood pressure  134/49, pulse 114, respirations 22.  GENERAL:  She is awake and alert.  HEENT:  Sclerae are clear.  NECK:   Supple.  LUNGS:  Clear to auscultation.  HEART:  Regular.  Pulses palpable on the left chest.  ABDOMEN:  Soft.  She has some right upper quadrant tenderness with no  guarding and no masses palpated.  Bowel sounds are hypoactive.  She has a  healed upper midline scar.  SKIN:  Warm and dry.   DATA REVIEWED:  White blood cell count 10, hemoglobin 11.5, platelets 355.  Liver function tests are normal.  Sodium 136, potassium 5.8, chloride 116,  CO2 10, BUN 62, creatinine 4.6.   CT scan findings are as listed above.   IMPRESSION:  1.  Acute cholecystitis.  2.  Acute renal failure.  3.  Urinary tract infection.   Recommend medical admission and treatment of her renal failure.  She may  possibly need renal evaluation, primary evaluation by the renal service.  Also recommended IV antibiotics and bowel rest.  We will follow along with  you and plan possible cholecystectomy once her medical problems have  improved versus percutaneous cholecystotomy tube or medical management of  her cholecystitis at this time.  Again, we  will follow along closely with  you.  The plan was discussed in detail with the patient and her daughter.      Merri Ray Grandville Silos, M.D.  Electronically Signed     BET/MEDQ  D:  02/12/2006  T:  02/12/2006  Job:  [id number]

## 2011-05-29 ENCOUNTER — Other Ambulatory Visit: Payer: Self-pay | Admitting: Internal Medicine

## 2011-05-29 DIAGNOSIS — R1011 Right upper quadrant pain: Secondary | ICD-10-CM

## 2011-06-06 ENCOUNTER — Other Ambulatory Visit: Payer: Medicare Other

## 2011-06-07 ENCOUNTER — Ambulatory Visit
Admission: RE | Admit: 2011-06-07 | Discharge: 2011-06-07 | Disposition: A | Discharge status: Home / Self Care | Payer: Medicare Other | Source: Ambulatory Visit | Attending: Internal Medicine | Admitting: Internal Medicine

## 2011-06-07 ENCOUNTER — Other Ambulatory Visit: Payer: Medicare Other

## 2011-06-07 DIAGNOSIS — R1011 Right upper quadrant pain: Secondary | ICD-10-CM

## 2011-07-27 LAB — GLUCOSE, CAPILLARY: Glucose-Capillary: 90

## 2011-08-02 ENCOUNTER — Other Ambulatory Visit: Payer: Self-pay | Admitting: Gastroenterology

## 2011-08-06 ENCOUNTER — Ambulatory Visit
Admission: RE | Admit: 2011-08-06 | Discharge: 2011-08-06 | Disposition: A | Discharge status: Home / Self Care | Payer: Medicare Other | Source: Ambulatory Visit | Attending: Gastroenterology | Admitting: Gastroenterology

## 2011-08-15 LAB — BASIC METABOLIC PANEL
BUN: 18
CO2: 27
Calcium: 7.6 — ABNORMAL LOW
Chloride: 107
Creatinine, Ser: 1.31 — ABNORMAL HIGH
GFR calc Af Amer: 50 — ABNORMAL LOW
GFR calc non Af Amer: 41 — ABNORMAL LOW
Glucose, Bld: 106 — ABNORMAL HIGH
Potassium: 3.8
Sodium: 143

## 2011-08-15 LAB — T3, FREE: T3, Free: 2.7 (ref 2.3–4.2)

## 2011-08-15 LAB — T4, FREE: Free T4: 0.94

## 2011-08-15 LAB — CBC
HCT: 33.9 — ABNORMAL LOW
Hemoglobin: 10.9 — ABNORMAL LOW
MCHC: 32
MCV: 80
Platelets: 388
RBC: 4.24
RDW: 24.4 — ABNORMAL HIGH
WBC: 8.1

## 2011-08-15 LAB — TSH: TSH: 1.397

## 2011-08-16 LAB — AMYLASE
Amylase: 144 — ABNORMAL HIGH
Amylase: 157 — ABNORMAL HIGH
Amylase: 202 — ABNORMAL HIGH
Amylase: 90

## 2011-08-16 LAB — CBC
HCT: 25.2 — ABNORMAL LOW
HCT: 32.2 — ABNORMAL LOW
HCT: 33 — ABNORMAL LOW
HCT: 34.7 — ABNORMAL LOW
HCT: 35.9 — ABNORMAL LOW
HCT: 38.9
HCT: 39.5
HCT: 41.2
Hemoglobin: 10.3 — ABNORMAL LOW
Hemoglobin: 10.7 — ABNORMAL LOW
Hemoglobin: 11.1 — ABNORMAL LOW
Hemoglobin: 11.8 — ABNORMAL LOW
Hemoglobin: 12.7
Hemoglobin: 12.8
Hemoglobin: 13.4
Hemoglobin: 8.2 — ABNORMAL LOW
MCHC: 32.1
MCHC: 32.2
MCHC: 32.4
MCHC: 32.5
MCHC: 32.5
MCHC: 32.5
MCHC: 32.7
MCHC: 32.9
MCV: 73.4 — ABNORMAL LOW
MCV: 75.9 — ABNORMAL LOW
MCV: 76 — ABNORMAL LOW
MCV: 76.3 — ABNORMAL LOW
MCV: 76.9 — ABNORMAL LOW
MCV: 77.4 — ABNORMAL LOW
MCV: 79.6
MCV: 79.7
Platelets: 104 — ABNORMAL LOW
Platelets: 121 — ABNORMAL LOW
Platelets: 146 — ABNORMAL LOW
Platelets: 160
Platelets: 173
Platelets: 212
Platelets: 372
Platelets: 377
RBC: 3.43 — ABNORMAL LOW
RBC: 4.03
RBC: 4.15
RBC: 4.48
RBC: 4.66
RBC: 5.13 — ABNORMAL HIGH
RBC: 5.2 — ABNORMAL HIGH
RBC: 5.4 — ABNORMAL HIGH
RDW: 18.6 — ABNORMAL HIGH
RDW: 19.9 — ABNORMAL HIGH
RDW: 20 — ABNORMAL HIGH
RDW: 20.2 — ABNORMAL HIGH
RDW: 20.5 — ABNORMAL HIGH
RDW: 20.7 — ABNORMAL HIGH
RDW: 24.4 — ABNORMAL HIGH
RDW: 24.5 — ABNORMAL HIGH
WBC: 10.4
WBC: 11.9 — ABNORMAL HIGH
WBC: 12.6 — ABNORMAL HIGH
WBC: 13.9 — ABNORMAL HIGH
WBC: 14.7 — ABNORMAL HIGH
WBC: 15.5 — ABNORMAL HIGH
WBC: 8.2
WBC: 9

## 2011-08-16 LAB — COMPREHENSIVE METABOLIC PANEL
ALT: 26
ALT: 26
ALT: 27
AST: 26
AST: 27
AST: 34
Albumin: 2.1 — ABNORMAL LOW
Albumin: 2.1 — ABNORMAL LOW
Albumin: 2.5 — ABNORMAL LOW
Alkaline Phosphatase: 85
Alkaline Phosphatase: 88
Alkaline Phosphatase: 98
BUN: 22
BUN: 27 — ABNORMAL HIGH
BUN: 30 — ABNORMAL HIGH
CO2: 15 — ABNORMAL LOW
CO2: 18 — ABNORMAL LOW
CO2: 21
Calcium: 7.5 — ABNORMAL LOW
Calcium: 8 — ABNORMAL LOW
Calcium: 8.5
Chloride: 114 — ABNORMAL HIGH
Chloride: 122 — ABNORMAL HIGH
Chloride: 125 — ABNORMAL HIGH
Creatinine, Ser: 1.52 — ABNORMAL HIGH
Creatinine, Ser: 1.82 — ABNORMAL HIGH
Creatinine, Ser: 2.48 — ABNORMAL HIGH
GFR calc Af Amer: 24 — ABNORMAL LOW
GFR calc Af Amer: 34 — ABNORMAL LOW
GFR calc Af Amer: 42 — ABNORMAL LOW
GFR calc non Af Amer: 20 — ABNORMAL LOW
GFR calc non Af Amer: 28 — ABNORMAL LOW
GFR calc non Af Amer: 35 — ABNORMAL LOW
Glucose, Bld: 106 — ABNORMAL HIGH
Glucose, Bld: 144 — ABNORMAL HIGH
Glucose, Bld: 91
Potassium: 2.7 — CL
Potassium: 3.4 — ABNORMAL LOW
Potassium: 4.4
Sodium: 144
Sodium: 146 — ABNORMAL HIGH
Sodium: 149 — ABNORMAL HIGH
Total Bilirubin: 0.8
Total Bilirubin: 0.9
Total Bilirubin: 2.1 — ABNORMAL HIGH
Total Protein: 4.5 — ABNORMAL LOW
Total Protein: 4.8 — ABNORMAL LOW
Total Protein: 5.5 — ABNORMAL LOW

## 2011-08-16 LAB — BASIC METABOLIC PANEL
BUN: 11
BUN: 12
BUN: 12
BUN: 13
BUN: 14
BUN: 15
BUN: 20
BUN: 22
BUN: 22
BUN: 7
BUN: 9
CO2: 20
CO2: 20
CO2: 20
CO2: 20
CO2: 21
CO2: 21
CO2: 21
CO2: 23
CO2: 23
CO2: 23
CO2: 26
Calcium: 7.3 — ABNORMAL LOW
Calcium: 7.4 — ABNORMAL LOW
Calcium: 7.5 — ABNORMAL LOW
Calcium: 7.5 — ABNORMAL LOW
Calcium: 7.5 — ABNORMAL LOW
Calcium: 7.7 — ABNORMAL LOW
Calcium: 7.7 — ABNORMAL LOW
Calcium: 7.8 — ABNORMAL LOW
Calcium: 7.9 — ABNORMAL LOW
Calcium: 7.9 — ABNORMAL LOW
Calcium: 8.2 — ABNORMAL LOW
Chloride: 105
Chloride: 106
Chloride: 107
Chloride: 108
Chloride: 109
Chloride: 110
Chloride: 110
Chloride: 111
Chloride: 112
Chloride: 113 — ABNORMAL HIGH
Chloride: 115 — ABNORMAL HIGH
Creatinine, Ser: 1.11
Creatinine, Ser: 1.24 — ABNORMAL HIGH
Creatinine, Ser: 1.24 — ABNORMAL HIGH
Creatinine, Ser: 1.27 — ABNORMAL HIGH
Creatinine, Ser: 1.27 — ABNORMAL HIGH
Creatinine, Ser: 1.28 — ABNORMAL HIGH
Creatinine, Ser: 1.29 — ABNORMAL HIGH
Creatinine, Ser: 1.29 — ABNORMAL HIGH
Creatinine, Ser: 1.35 — ABNORMAL HIGH
Creatinine, Ser: 1.36 — ABNORMAL HIGH
Creatinine, Ser: 1.47 — ABNORMAL HIGH
GFR calc Af Amer: 44 — ABNORMAL LOW
GFR calc Af Amer: 48 — ABNORMAL LOW
GFR calc Af Amer: 48 — ABNORMAL LOW
GFR calc Af Amer: 51 — ABNORMAL LOW
GFR calc Af Amer: 51 — ABNORMAL LOW
GFR calc Af Amer: 51 — ABNORMAL LOW
GFR calc Af Amer: 52 — ABNORMAL LOW
GFR calc Af Amer: 52 — ABNORMAL LOW
GFR calc Af Amer: 53 — ABNORMAL LOW
GFR calc Af Amer: 53 — ABNORMAL LOW
GFR calc Af Amer: >60
GFR calc non Af Amer: 36 — ABNORMAL LOW
GFR calc non Af Amer: 40 — ABNORMAL LOW
GFR calc non Af Amer: 40 — ABNORMAL LOW
GFR calc non Af Amer: 42 — ABNORMAL LOW
GFR calc non Af Amer: 42 — ABNORMAL LOW
GFR calc non Af Amer: 42 — ABNORMAL LOW
GFR calc non Af Amer: 43 — ABNORMAL LOW
GFR calc non Af Amer: 43 — ABNORMAL LOW
GFR calc non Af Amer: 44 — ABNORMAL LOW
GFR calc non Af Amer: 44 — ABNORMAL LOW
GFR calc non Af Amer: 50 — ABNORMAL LOW
Glucose, Bld: 100 — ABNORMAL HIGH
Glucose, Bld: 100 — ABNORMAL HIGH
Glucose, Bld: 101 — ABNORMAL HIGH
Glucose, Bld: 111 — ABNORMAL HIGH
Glucose, Bld: 112 — ABNORMAL HIGH
Glucose, Bld: 121 — ABNORMAL HIGH
Glucose, Bld: 125 — ABNORMAL HIGH
Glucose, Bld: 131 — ABNORMAL HIGH
Glucose, Bld: 143 — ABNORMAL HIGH
Glucose, Bld: 170 — ABNORMAL HIGH
Glucose, Bld: 97
Potassium: 3.1 — ABNORMAL LOW
Potassium: 3.1 — ABNORMAL LOW
Potassium: 3.2 — ABNORMAL LOW
Potassium: 3.3 — ABNORMAL LOW
Potassium: 3.5
Potassium: 3.6
Potassium: 3.6
Potassium: 3.7
Potassium: 3.7
Potassium: 3.8
Potassium: 3.8
Sodium: 136
Sodium: 136
Sodium: 137
Sodium: 138
Sodium: 138
Sodium: 138
Sodium: 139
Sodium: 140
Sodium: 142
Sodium: 143
Sodium: 144

## 2011-08-16 LAB — HEMOGLOBIN A1C
Hgb A1c MFr Bld: 5.8
Hgb A1c MFr Bld: 5.9

## 2011-08-16 LAB — CLOSTRIDIUM DIFFICILE EIA
C difficile Toxins A+B, EIA: NEGATIVE
C difficile Toxins A+B, EIA: NEGATIVE
C difficile Toxins A+B, EIA: NEGATIVE

## 2011-08-16 LAB — HEMOGLOBIN AND HEMATOCRIT, BLOOD
HCT: 25.7 — ABNORMAL LOW
HCT: 33 — ABNORMAL LOW
Hemoglobin: 10.5 — ABNORMAL LOW
Hemoglobin: 8.1 — ABNORMAL LOW

## 2011-08-16 LAB — URINALYSIS, ROUTINE W REFLEX MICROSCOPIC
Bilirubin Urine: NEGATIVE
Glucose, UA: NEGATIVE
Hgb urine dipstick: NEGATIVE
Ketones, ur: NEGATIVE
Leukocytes, UA: NEGATIVE
Nitrite: NEGATIVE
Protein, ur: 30 — AB
Specific Gravity, Urine: 1.022
Urobilinogen, UA: 0.2
pH: 5.5

## 2011-08-16 LAB — GIARDIA/CRYPTOSPORIDIUM SCREEN(EIA)
Cryptosporidium Screen (EIA): NEGATIVE
Giardia Screen - EIA: NEGATIVE

## 2011-08-16 LAB — POTASSIUM: Potassium: 3.5

## 2011-08-16 LAB — LIPASE, BLOOD
Lipase: 100 — ABNORMAL HIGH
Lipase: 113 — ABNORMAL HIGH
Lipase: 41
Lipase: 65 — ABNORMAL HIGH
Lipase: 75 — ABNORMAL HIGH
Lipase: 86 — ABNORMAL HIGH

## 2011-08-16 LAB — TISSUE TRANSGLUTAMINASE, IGG: Tissue Transglut Ab: <3 U/mL (ref <7)

## 2011-08-16 LAB — STOOL CULTURE

## 2011-08-16 LAB — CK TOTAL AND CKMB (NOT AT ARMC)
CK, MB: 3.1
Relative Index: INVALID
Total CK: 48

## 2011-08-16 LAB — MAGNESIUM
Magnesium: 1.6
Magnesium: 1.9

## 2011-08-16 LAB — FECAL LACTOFERRIN, QUANT: Fecal Lactoferrin: POSITIVE

## 2011-08-16 LAB — CK: Total CK: 239 — ABNORMAL HIGH

## 2011-08-16 LAB — CRYPTOSPORIDIUM SMEAR, FECAL

## 2011-08-16 LAB — T3, FREE: T3, Free: 1.5 — ABNORMAL LOW (ref 2.3–4.2)

## 2011-08-16 LAB — T4, FREE: Free T4: 0.75 — ABNORMAL LOW

## 2011-08-16 LAB — URINE MICROSCOPIC-ADD ON

## 2011-08-16 LAB — TSH: TSH: 0.582

## 2011-08-16 LAB — GLUCOSE, RANDOM: Glucose, Bld: 101 — ABNORMAL HIGH

## 2011-09-07 ENCOUNTER — Other Ambulatory Visit (INDEPENDENT_AMBULATORY_CARE_PROVIDER_SITE_OTHER): Payer: Medicare Other | Admitting: *Deleted

## 2011-09-07 DIAGNOSIS — I6529 Occlusion and stenosis of unspecified carotid artery: Secondary | ICD-10-CM

## 2011-09-07 DIAGNOSIS — Z48812 Encounter for surgical aftercare following surgery on the circulatory system: Secondary | ICD-10-CM

## 2011-09-18 ENCOUNTER — Other Ambulatory Visit: Payer: Self-pay | Admitting: Cardiology

## 2011-09-18 ENCOUNTER — Encounter: Payer: Self-pay | Admitting: Cardiology

## 2011-09-18 DIAGNOSIS — M797 Fibromyalgia: Secondary | ICD-10-CM | POA: Insufficient documentation

## 2011-09-18 DIAGNOSIS — F329 Major depressive disorder, single episode, unspecified: Secondary | ICD-10-CM

## 2011-09-18 DIAGNOSIS — R0781 Pleurodynia: Secondary | ICD-10-CM | POA: Insufficient documentation

## 2011-09-18 DIAGNOSIS — F32A Depression, unspecified: Secondary | ICD-10-CM | POA: Insufficient documentation

## 2011-09-18 DIAGNOSIS — I6529 Occlusion and stenosis of unspecified carotid artery: Secondary | ICD-10-CM

## 2011-09-18 DIAGNOSIS — J45909 Unspecified asthma, uncomplicated: Secondary | ICD-10-CM | POA: Insufficient documentation

## 2011-09-18 DIAGNOSIS — E0500 Thyrotoxicosis with diffuse goiter without thyrotoxic crisis or storm: Secondary | ICD-10-CM

## 2011-09-18 DIAGNOSIS — I809 Phlebitis and thrombophlebitis of unspecified site: Secondary | ICD-10-CM

## 2011-09-18 DIAGNOSIS — I10 Essential (primary) hypertension: Secondary | ICD-10-CM

## 2011-09-18 NOTE — H&P (Signed)
Office Visit     Patient: [name], [name] Provider: Fransico Him, MD  DOB: [birth year] Age: 65 Y Sex: Female Date: 09/18/2011  Phone: [phone]   Address: [address], [place], [place]  Pcp: Thayer Jew, MD        Subjective:     CC:    1. TO DISCUSS CATH.        HPI:  General:  The patient presents today for further evaluation of chest pain. She says only 1 time hasit been very bad. A few weeks ago she had a severe burning pain in the midsternal region which radiated into her jaw. She took some of her husband's NTG which was 61 years old and the burning sensation resolved. She also has complains of small episodes of sharp pins and needles pain over her left breast on occasion. She occasionally has SOB and somkes. She used to smoke 3ppd but is now down to 2ppd. She occasionaly has some mild LE edema. She says that she has palpitations which has occured for years. These occur once monthly and resolves if she coughs. SHe underwent nuclear stress testing which showed an apical reversible defect c/w ischemia. Her TID is also elevated worrisome for multivessel disease. Becuase of her history of stage 3 CKD I contacted Dr. Justin Mend who is her nephrologist. He he concerned about further kidney damage if she is exposed to IVP dye. The patient states that since she saw me last she has not had any further episodes of chest pain. .        ROS:  See HPI, A twelve system review was perfomed at today's visit. For pertinent positives and negatives see HPI.       Medical History: Asthma, High blood pressure, Graves' disease, Fibromyalgia, Depression, Thrombophlebitis.        Surgical History: gastrectomy , cholecystectomy .        Hospitalization/Major Diagnostic Procedure: not in past year 07/2011.        Family History:  Negative GI family history.       Social History:  General:  History of smoking  cigarettes: Current smoker Smoking: yes, 2 ppd.  no Alcohol.  no  Recreational drug use.        Medications: Reglan 5 MG Tablet 1 tablet 30 minutes before meals and at bedtime every 6 hrs, Robinul-Forte 2 Tablet 1Q8HPRN - TAKE ONE TABLET BY MOUTH EVERY 8 TO 12 HOURS AS NEEDED , Simvastatin 10 MG Tablet 1 tablet every evening Once a day, Amlodipine Besylate 5 MG Tablet 1 tablet Once a day, Furosemide 20 MG Tablet 1 tablet Once a day, Vitamin D 3 50,000 units pill 1 pill once a week, Aspir-81 81 MG Tablet Delayed Release 1 tablet Once a day, Klor-Con M20 20 MEQ Tablet Extended Release 1 tablet once a day as needed, PredniSONE 5 MG Tablet 1 tablet with food or milk every other day, Promethazine HCl 25 MG Tablet as directed as needed, Klonopin 1 MG Tablet 1 tablet at bedtime once a day as needed, Cymbalta 60 MG Capsule Delayed Release Particles 1 capsule Once a day, Fentanyl 75 MCG/HR Patch 72 Hour 1 patch to the skin Every 48 hrs, Levsin/SL 0.125 MG Tablet Sublingual 1 tablet under the tongue and allow to dissolve before meals as directed as needed, Calcium + D 600-200 MG-UNIT Tablet 1 tablet with food Once a day, Prenatal 28-0.8 MG Tablet 1 tablet Once a day, Calcium Acetate 667 MG Capsule 1 tablet tiwce a day,  Pantoprazole Sodium 40 MG Tablet Delayed Release 1 tablet Once a day, Dicyclomine HCl 20 MG Tablet 1 tablet Four times a day, Oxycodone HCl 5 MG Tablet 1 Tablet as directed, Medication List reviewed and reconciled with the patient       Allergies: Biaxin: Nausea: Allergy.       Objective:     Vitals: Wt 131.6, Wt change 2.6 lb, Ht 66, BMI 21.24, Pulse sitting 96, BP sitting 130/60.       Examination:  Cardiology, General:  GENERAL APPEARANCE: pleasant, NAD.  HEENT: unremarkable.  CAROTID UPSTROKE: normal, no bruit.  JVD: flat.  HEART SOUNDS: regular, normal S1, S2, no S3 or S4.  MURMUR: absent.  LUNGS: no rales or wheezes.  ABDOMEN: soft, non tender, positive bowel sounds, no masses felt.  EXTREMITIES: no leg edema.  PERIPHERAL PULSES: 2 plus  bilateral.        Assessment:     Assessment:  1. Chest pain - 786.50 (Primary), I have explained to the patient the concerns that Dr. Justin Mend has in regards to worsening kidney function and exposure to IV contrast dye. She understands that her nuclear stress test was abnormal showing an anteroapical defect worrisome for ischemia. She has a history of vascular disease with carotid artery disease and is a heavy smoker. Her probability of underlying CAD is high especially given the fact that she has transient ischemic dilatation on her nuclear study.  2. Abnormal cardiovascular function study - 794.30    Plan:     1. Chest pain Continue Aspir-81 Tablet Delayed Release, 81 MG, 1 tablet, Orally, Once a day ; Start Nitroglycerin 0.4 mg tablet, 0.4 mg, 1 tablet as directed, SL, as directed prn chest pain, 30 days, 25, Refills 5 .  Diagnostic Imaging:Cardiac Cath (Ordered for 09/18/2011)  Risks and benefits of cardiac catheterization have been reviewed including risk of stroke, heart attack, death, bleeding, renal impariment and arterial damage. There was ample oppurtuny to answer questions. Alternatives were discussed. Patient understands and wishes to proceed. She understands the fairly significant risk of worsening renal function with possiblity of need for HD. I am going to admit her a day prior to the cath for IVF hydration, mucomyst and sodium bicarb gtt.        Immunizations:        Labs:        Preventive:         Follow Up: cardiac cath      Provider: Fransico Him, MD  Patient: [name], [name] DOB: [birth year] Date: 09/18/2011

## 2011-09-19 ENCOUNTER — Encounter: Payer: Self-pay | Admitting: Vascular Surgery

## 2011-09-19 ENCOUNTER — Encounter (HOSPITAL_COMMUNITY): Payer: Self-pay | Admitting: Pharmacy Technician

## 2011-09-19 NOTE — Procedures (Unsigned)
CAROTID DUPLEX EXAM  INDICATION:  Follow up carotid endarterectomy.  HISTORY: Diabetes:  Yes. Cardiac:  No. Hypertension:  Yes. Smoking:  Yes. Previous Surgery:  Left carotid endarterectomy, 07/18/2010. CV History:  Currently asymptomatic. Amaurosis Fugax No, Paresthesias No, Hemiparesis No. Patient wishes to not have blood pressures taken.                                      RIGHT             LEFT Brachial systolic pressure: Brachial Doppler waveforms: Vertebral direction of flow:        Antegrade         Antegrade DUPLEX VELOCITIES (cm/sec) CCA peak systolic                   65                [id number] ECA peak systolic                   145               79 ICA peak systolic                   209               [id number] ICA end diastolic                   61                38 PLAQUE MORPHOLOGY:                  Mixed PLAQUE AMOUNT:                      Moderate          None PLAQUE LOCATION:                    ICA, ECA  IMPRESSION: 1. Right internal carotid artery velocities suggest 60% to 79%     stenosis. 2. Patent left carotid endarterectomy site without evidence of     restenosis of the internal carotid artery. 3. Stable in comparison to the last examination.  ___________________________________________ Judeth Cornfield. Scot Dock, M.D.  EM/MEDQ  D:  09/07/2011  T:  09/07/2011  Job:  [id number]

## 2011-09-25 ENCOUNTER — Other Ambulatory Visit: Payer: Self-pay | Admitting: Vascular Surgery

## 2011-09-25 DIAGNOSIS — I6529 Occlusion and stenosis of unspecified carotid artery: Secondary | ICD-10-CM

## 2011-09-25 DIAGNOSIS — Z48812 Encounter for surgical aftercare following surgery on the circulatory system: Secondary | ICD-10-CM

## 2011-10-01 ENCOUNTER — Observation Stay (HOSPITAL_COMMUNITY)
Admission: AD | Admit: 2011-10-01 | Discharge: 2011-10-02 | Disposition: A | Discharge status: Home / Self Care | Payer: Medicare Other | Source: Ambulatory Visit | Attending: Cardiology | Admitting: Cardiology

## 2011-10-01 ENCOUNTER — Inpatient Hospital Stay (HOSPITAL_COMMUNITY): Payer: Medicare Other

## 2011-10-01 ENCOUNTER — Encounter (HOSPITAL_COMMUNITY): Payer: Self-pay | Admitting: General Practice

## 2011-10-01 DIAGNOSIS — I2510 Atherosclerotic heart disease of native coronary artery without angina pectoris: Secondary | ICD-10-CM | POA: Insufficient documentation

## 2011-10-01 DIAGNOSIS — N289 Disorder of kidney and ureter, unspecified: Secondary | ICD-10-CM | POA: Insufficient documentation

## 2011-10-01 DIAGNOSIS — I10 Essential (primary) hypertension: Secondary | ICD-10-CM | POA: Insufficient documentation

## 2011-10-01 DIAGNOSIS — Z01812 Encounter for preprocedural laboratory examination: Secondary | ICD-10-CM | POA: Insufficient documentation

## 2011-10-01 DIAGNOSIS — F17200 Nicotine dependence, unspecified, uncomplicated: Secondary | ICD-10-CM | POA: Insufficient documentation

## 2011-10-01 DIAGNOSIS — Z01818 Encounter for other preprocedural examination: Secondary | ICD-10-CM | POA: Insufficient documentation

## 2011-10-01 DIAGNOSIS — R9439 Abnormal result of other cardiovascular function study: Secondary | ICD-10-CM | POA: Insufficient documentation

## 2011-10-01 DIAGNOSIS — R079 Chest pain, unspecified: Principal | ICD-10-CM | POA: Insufficient documentation

## 2011-10-01 HISTORY — DX: Sleep apnea, unspecified: G47.30

## 2011-10-01 HISTORY — DX: Gastro-esophageal reflux disease without esophagitis: K21.9

## 2011-10-01 HISTORY — DX: Chronic obstructive pulmonary disease, unspecified: J44.9

## 2011-10-01 HISTORY — DX: Unspecified osteoarthritis, unspecified site: M19.90

## 2011-10-01 HISTORY — DX: Acute upper respiratory infection, unspecified: J06.9

## 2011-10-01 LAB — DIFFERENTIAL
Basophils Absolute: 0 10*3/uL (ref 0.0–0.1)
Basophils Relative: 0 % (ref 0–1)
Eosinophils Absolute: 0.1 10*3/uL (ref 0.0–0.7)
Eosinophils Relative: 2 % (ref 0–5)
Lymphocytes Relative: 12 % (ref 12–46)
Lymphs Abs: 1 10*3/uL (ref 0.7–4.0)
Monocytes Absolute: 0.3 10*3/uL (ref 0.1–1.0)
Monocytes Relative: 4 % (ref 3–12)
Neutro Abs: 7 10*3/uL (ref 1.7–7.7)
Neutrophils Relative %: 82 % — ABNORMAL HIGH (ref 43–77)

## 2011-10-01 LAB — CBC
HCT: 37.7 % (ref 36.0–46.0)
Hemoglobin: 12.6 g/dL (ref 12.0–15.0)
MCH: 31.6 pg (ref 26.0–34.0)
MCHC: 33.4 g/dL (ref 30.0–36.0)
MCV: 94.5 fL (ref 78.0–100.0)
Platelets: 219 10*3/uL (ref 150–400)
RBC: 3.99 MIL/uL (ref 3.87–5.11)
RDW: 12.6 % (ref 11.5–15.5)
WBC: 8.6 10*3/uL (ref 4.0–10.5)

## 2011-10-01 LAB — PROTIME-INR
INR: 0.96 (ref 0.00–1.49)
Prothrombin Time: 13 seconds (ref 11.6–15.2)

## 2011-10-01 LAB — COMPREHENSIVE METABOLIC PANEL
ALT: 9 U/L (ref 0–35)
AST: 16 U/L (ref 0–37)
Albumin: 3.7 g/dL (ref 3.5–5.2)
Alkaline Phosphatase: 83 U/L (ref 39–117)
BUN: 38 mg/dL — ABNORMAL HIGH (ref 6–23)
CO2: 28 mEq/L (ref 19–32)
Calcium: 8.5 mg/dL (ref 8.4–10.5)
Chloride: 100 mEq/L (ref 96–112)
Creatinine, Ser: 2.1 mg/dL — ABNORMAL HIGH (ref 0.50–1.10)
GFR calc Af Amer: 27 mL/min — ABNORMAL LOW (ref >90)
GFR calc non Af Amer: 23 mL/min — ABNORMAL LOW (ref >90)
Glucose, Bld: 102 mg/dL — ABNORMAL HIGH (ref 70–99)
Potassium: 3.8 mEq/L (ref 3.5–5.1)
Sodium: 139 mEq/L (ref 135–145)
Total Bilirubin: 0.2 mg/dL — ABNORMAL LOW (ref 0.3–1.2)
Total Protein: 6.6 g/dL (ref 6.0–8.3)

## 2011-10-01 LAB — PLATELET INHIBITION P2Y12: Platelet Function  P2Y12: 289 [PRU] (ref 194–418)

## 2011-10-01 LAB — TSH: TSH: 0.516 u[IU]/mL (ref 0.350–4.500)

## 2011-10-01 LAB — PHOSPHORUS: Phosphorus: 3.6 mg/dL (ref 2.3–4.6)

## 2011-10-01 LAB — MAGNESIUM: Magnesium: 1.9 mg/dL (ref 1.5–2.5)

## 2011-10-01 MED ORDER — PRENATAL 27-0.8 MG PO TABS
1.0000 | ORAL_TABLET | Freq: Every day | ORAL | Status: DC
  Filled 2011-10-01 (×2): qty 1

## 2011-10-01 MED ORDER — SODIUM BICARBONATE 8.4 % IV SOLN
INTRAVENOUS | Status: DC
  Filled 2011-10-01: qty 1000

## 2011-10-01 MED ORDER — DICYCLOMINE HCL 20 MG PO TABS
20.0000 mg | ORAL_TABLET | Freq: Four times a day (QID) | ORAL | Status: DC
Start: 2011-10-01 — End: 2011-10-02
  Administered 2011-10-01 – 2011-10-02 (×3): 20 mg via ORAL
  Filled 2011-10-01 (×9): qty 1

## 2011-10-01 MED ORDER — CALCIUM ACETATE 667 MG PO CAPS
667.0000 mg | ORAL_CAPSULE | Freq: Three times a day (TID) | ORAL | Status: DC
  Administered 2011-10-01 – 2011-10-02 (×3): 667 mg via ORAL
  Filled 2011-10-01 (×5): qty 1

## 2011-10-01 MED ORDER — HEPARIN SODIUM (PORCINE) 5000 UNIT/ML IJ SOLN
5000.0000 [IU] | Freq: Three times a day (TID) | INTRAMUSCULAR | Status: DC
  Administered 2011-10-01 – 2011-10-02 (×3): 5000 [IU] via SUBCUTANEOUS
  Filled 2011-10-01 (×6): qty 1

## 2011-10-01 MED ORDER — SIMVASTATIN 10 MG PO TABS
10.0000 mg | ORAL_TABLET | Freq: Every day | ORAL | Status: DC
  Administered 2011-10-01: 10 mg via ORAL
  Filled 2011-10-01 (×2): qty 1

## 2011-10-01 MED ORDER — ASPIRIN 81 MG PO TABS: 81.0000 mg | ORAL_TABLET | Freq: Every day | ORAL | Status: DC

## 2011-10-01 MED ORDER — SODIUM CHLORIDE 0.9 % IV SOLN
INTRAVENOUS | Status: DC
  Administered 2011-10-01: 18:00:00 via INTRAVENOUS

## 2011-10-01 MED ORDER — OXYCODONE HCL 5 MG PO TABS
5.0000 mg | ORAL_TABLET | ORAL | Status: DC | PRN
  Administered 2011-10-01 – 2011-10-02 (×5): 5 mg via ORAL
  Filled 2011-10-01 (×5): qty 1

## 2011-10-01 MED ORDER — ASPIRIN 81 MG PO CHEW
324.0000 mg | CHEWABLE_TABLET | ORAL | Status: AC
  Administered 2011-10-02: 324 mg via ORAL
  Filled 2011-10-01: qty 4

## 2011-10-01 MED ORDER — ZOLPIDEM TARTRATE 5 MG PO TABS: 5.0000 mg | ORAL_TABLET | Freq: Every evening | ORAL | Status: DC | PRN

## 2011-10-01 MED ORDER — CLONAZEPAM 1 MG PO TABS
1.0000 mg | ORAL_TABLET | Freq: Every evening | ORAL | Status: DC | PRN
  Administered 2011-10-01 – 2011-10-02 (×2): 1 mg via ORAL
  Filled 2011-10-01 (×2): qty 1

## 2011-10-01 MED ORDER — NITROGLYCERIN 0.4 MG SL SUBL: 0.4000 mg | SUBLINGUAL_TABLET | SUBLINGUAL | Status: DC | PRN

## 2011-10-01 MED ORDER — FENTANYL 75 MCG/HR TD PT72
75.0000 ug | MEDICATED_PATCH | TRANSDERMAL | Status: DC
  Administered 2011-10-01: 75 ug via TRANSDERMAL
  Filled 2011-10-01: qty 1

## 2011-10-01 MED ORDER — DIAZEPAM 5 MG PO TABS
5.0000 mg | ORAL_TABLET | ORAL | Status: AC
  Administered 2011-10-02: 5 mg via ORAL
  Filled 2011-10-01: qty 1

## 2011-10-01 MED ORDER — PANTOPRAZOLE SODIUM 40 MG PO TBEC
40.0000 mg | DELAYED_RELEASE_TABLET | Freq: Two times a day (BID) | ORAL | Status: DC
  Administered 2011-10-01: 40 mg via ORAL
  Filled 2011-10-01: qty 1

## 2011-10-01 MED ORDER — NICOTINE 21 MG/24HR TD PT24
21.0000 mg | MEDICATED_PATCH | Freq: Every day | TRANSDERMAL | Status: DC
  Administered 2011-10-01 – 2011-10-02 (×2): 21 mg via TRANSDERMAL
  Filled 2011-10-01 (×2): qty 1

## 2011-10-01 MED ORDER — IPRATROPIUM-ALBUTEROL 18-103 MCG/ACT IN AERO
2.0000 | INHALATION_SPRAY | Freq: Four times a day (QID) | RESPIRATORY_TRACT | Status: DC | PRN
  Filled 2011-10-01: qty 14.7

## 2011-10-01 MED ORDER — HYOSCYAMINE SULFATE 0.125 MG SL SUBL
0.1250 mg | SUBLINGUAL_TABLET | SUBLINGUAL | Status: DC | PRN
  Filled 2011-10-01: qty 1

## 2011-10-01 MED ORDER — ACETAMINOPHEN 650 MG RE SUPP: 650.0000 mg | Freq: Four times a day (QID) | RECTAL | Status: DC | PRN

## 2011-10-01 MED ORDER — METOCLOPRAMIDE HCL 5 MG PO TABS
5.0000 mg | ORAL_TABLET | Freq: Four times a day (QID) | ORAL | Status: DC
  Administered 2011-10-01 – 2011-10-02 (×4): 5 mg via ORAL
  Filled 2011-10-01 (×6): qty 1

## 2011-10-01 MED ORDER — DEXTROSE 5 % IV SOLN: INTRAVENOUS | Status: DC

## 2011-10-01 MED ORDER — AMLODIPINE BESYLATE 5 MG PO TABS
5.0000 mg | ORAL_TABLET | Freq: Every day | ORAL | Status: DC
  Administered 2011-10-01: 5 mg via ORAL
  Filled 2011-10-01 (×2): qty 1

## 2011-10-01 MED ORDER — ACETYLCYSTEINE 20 % IN SOLN: 600.0000 mg | Freq: Two times a day (BID) | RESPIRATORY_TRACT | Status: DC

## 2011-10-01 MED ORDER — ASPIRIN EC 81 MG PO TBEC
81.0000 mg | DELAYED_RELEASE_TABLET | Freq: Every day | ORAL | Status: DC
  Administered 2011-10-01: 81 mg via ORAL
  Filled 2011-10-01 (×2): qty 1

## 2011-10-01 MED ORDER — DULOXETINE HCL 60 MG PO CPEP
60.0000 mg | ORAL_CAPSULE | Freq: Every day | ORAL | Status: DC
  Administered 2011-10-01 – 2011-10-02 (×2): 60 mg via ORAL
  Filled 2011-10-01 (×2): qty 1

## 2011-10-01 MED ORDER — PREDNISONE 5 MG PO TABS
5.0000 mg | ORAL_TABLET | ORAL | Status: DC
  Administered 2011-10-01: 5 mg via ORAL
  Filled 2011-10-01: qty 1

## 2011-10-01 MED ORDER — DIAZEPAM 5 MG PO TABS: 5.0000 mg | ORAL_TABLET | ORAL | Status: DC

## 2011-10-01 MED ORDER — ACETAMINOPHEN 325 MG PO TABS: 650.0000 mg | ORAL_TABLET | Freq: Four times a day (QID) | ORAL | Status: DC | PRN

## 2011-10-01 MED ORDER — VITAMIN D (ERGOCALCIFEROL) 1.25 MG (50000 UNIT) PO CAPS
50000.0000 [IU] | ORAL_CAPSULE | ORAL | Status: DC
  Administered 2011-10-01: 50000 [IU] via ORAL
  Filled 2011-10-01: qty 1

## 2011-10-01 NOTE — Progress Notes (Signed)
Entered the bicarb order set for CIN. There are no orders currently for diuretics to hold. Pharmacy signing off. Please re-consult if needed.   Renold Genta Danielle 10/01/2011 3:04 PM

## 2011-10-02 ENCOUNTER — Encounter (HOSPITAL_COMMUNITY)
Admission: AD | Disposition: A | Discharge status: Home / Self Care | Payer: Self-pay | Source: Ambulatory Visit | Attending: Cardiology

## 2011-10-02 ENCOUNTER — Encounter (HOSPITAL_COMMUNITY): Payer: Self-pay | Admitting: Cardiology

## 2011-10-02 ENCOUNTER — Other Ambulatory Visit: Payer: Self-pay

## 2011-10-02 HISTORY — PX: LEFT HEART CATHETERIZATION WITH CORONARY ANGIOGRAM: SHX5451

## 2011-10-02 LAB — BASIC METABOLIC PANEL
BUN: 37 mg/dL — ABNORMAL HIGH (ref 6–23)
CO2: 26 mEq/L (ref 19–32)
Calcium: 8 mg/dL — ABNORMAL LOW (ref 8.4–10.5)
Chloride: 106 mEq/L (ref 96–112)
Creatinine, Ser: 1.91 mg/dL — ABNORMAL HIGH (ref 0.50–1.10)
GFR calc Af Amer: 30 mL/min — ABNORMAL LOW (ref >90)
GFR calc non Af Amer: 26 mL/min — ABNORMAL LOW (ref >90)
Glucose, Bld: 102 mg/dL — ABNORMAL HIGH (ref 70–99)
Potassium: 4 mEq/L (ref 3.5–5.1)
Sodium: 142 mEq/L (ref 135–145)

## 2011-10-02 LAB — POCT ACTIVATED CLOTTING TIME: Activated Clotting Time: 105 seconds

## 2011-10-02 SURGERY — LEFT HEART CATHETERIZATION WITH CORONARY ANGIOGRAM
Anesthesia: LOCAL

## 2011-10-02 MED ORDER — SODIUM CHLORIDE 0.9 % IV SOLN
1.0000 mL/kg/h | INTRAVENOUS | Status: DC
  Administered 2011-10-02: 1 mL/kg/h via INTRAVENOUS

## 2011-10-02 MED ORDER — FENTANYL CITRATE 0.05 MG/ML IJ SOLN
INTRAMUSCULAR | Status: AC
  Filled 2011-10-02: qty 2

## 2011-10-02 MED ORDER — MIDAZOLAM HCL 2 MG/2ML IJ SOLN
INTRAMUSCULAR | Status: AC
  Filled 2011-10-02: qty 2

## 2011-10-02 MED ORDER — ACETAMINOPHEN 325 MG PO TABS: 650.0000 mg | ORAL_TABLET | ORAL | Status: DC | PRN

## 2011-10-02 MED ORDER — SODIUM BICARBONATE 8.4 % IV SOLN
INTRAVENOUS | Status: DC
  Filled 2011-10-02: qty 1000

## 2011-10-02 MED ORDER — LIDOCAINE HCL (PF) 1 % IJ SOLN
INTRAMUSCULAR | Status: AC
  Filled 2011-10-02: qty 30

## 2011-10-02 MED ORDER — NITROGLYCERIN 0.2 MG/ML ON CALL CATH LAB
INTRAVENOUS | Status: AC
  Filled 2011-10-02: qty 1

## 2011-10-02 MED ORDER — HEPARIN (PORCINE) IN NACL 2-0.9 UNIT/ML-% IJ SOLN
INTRAMUSCULAR | Status: AC
  Filled 2011-10-02: qty 2000

## 2011-10-02 MED ORDER — ONDANSETRON HCL 4 MG/2ML IJ SOLN: 4.0000 mg | Freq: Four times a day (QID) | INTRAMUSCULAR | Status: DC | PRN

## 2011-10-02 NOTE — Consult Note (Signed)
Pt is a heavy smoker of 2 ppd and wants to quit (action stage). Pt is interested in using patches. Recommended starting with two 21 mg patches and wrote down instructions on tapering down. Discussed patch use instructions. Referred to 1-800 quit now for f/u and support. Discussed oral fixation substitutes, second hand smoke and in home smoking policy. Reviewed and gave pt Written education/contact information.

## 2011-10-02 NOTE — Progress Notes (Signed)
SUBJECTIVE:  The patient is doing well today.  She denies any chest pain.  Bicarb gtt for cath is currently running.  OBJECTIVE:   Vitals:   Filed Vitals:   10/01/11 1454 10/01/11 1621 10/01/11 2141 10/02/11 0628  BP:  118/62 99/63 100/63  Pulse:   79 71  Temp:   98.7 F (37.1 C) 98 F (36.7 C)  TempSrc:   Oral Oral  Resp:   18 18  Height: 5\' 6"  (1.676 m)     Weight: 57 kg (125 lb 10.6 oz)     SpO2:   95% 94%   I&O's:   Intake/Output Summary (Last 24 hours) at 10/02/11 0820 Last data filed at 10/02/11 0600  Gross per 24 hour  Intake    900 ml  Output   1600 ml  Net   -700 ml   TELEMETRY: Reviewed telemetry pt in NSR     PHYSICAL EXAM General: Well developed, well nourished, in no acute distress Head: Eyes PERRLA, No xanthomas.   Normal cephalic and atramatic  Lungs:   Clear bilaterally to auscultation and percussion. Heart:   HRRR S1 S2 Pulses are 2+ & equal.            No carotid bruit. No JVD.  No abdominal bruits. No femoral bruits. Abdomen: Bowel sounds are positive, abdomen soft and non-tender without masses or                  Hernia's noted. Msk:  Back normal, normal gait. Normal strength and tone for age. Extremities:   No clubbing, cyanosis or edema.  DP +1 Neuro: Alert and oriented X 3. Psych:  Good affect, responds appropriately   LABS: Basic Metabolic Panel:  Basename 10/02/11 0635 10/01/11 1652  NA 142 139  K 4.0 3.8  CL 106 100  CO2 26 28  GLUCOSE 102* 102*  BUN 37* 38*  CREATININE 1.91* 2.10*  CALCIUM 8.0* 8.5  MG -- 1.9  PHOS -- 3.6   Liver Function Tests:  Basename 10/01/11 1652  AST 16  ALT 9  ALKPHOS 83  BILITOT 0.2*  PROT 6.6  ALBUMIN 3.7   CBC:  Basename 10/01/11 1652  WBC 8.6  NEUTROABS 7.0  HGB 12.6  HCT 37.7  MCV 94.5  PLT 219   Thyroid Function Tests:  Basename 10/01/11 1652  TSH 0.516  T4TOTAL --  T3FREE --  THYROIDAB --   Anemia Panel: No results found for this basename:  VITAMINB12,FOLATE,FERRITIN,TIBC,IRON,RETICCTPCT in the last 72 hours Coag Panel:   Lab Results  Component Value Date   INR 0.96 10/01/2011   INR 0.87 07/12/2010    RADIOLOGY: X-ray Chest Pa And Lateral   10/01/2011  *RADIOLOGY REPORT*  Clinical Data: Preoperative assessment  CHEST - 2 VIEW  Comparison: 07/12/2010  Findings: Normal heart size, mediastinal contours, and pulmonary vascularity. Emphysematous changes with left basilar scarring. No acute infiltrate, pleural effusion, or pneumothorax. Bones appear diffusely demineralized.  IMPRESSION: Emphysematous changes left basilar scarring. No acute abnormalities.  Original Report Authenticated By: Burnetta Sabin, M.D.      ASSESSMENT:  1.  Chest pain with abnormal nuclear stress test  2.  PVD with carotid artery stenosis s/p left  CEA 3.  Ongoing tobacco abuse 4.  Asthma 5.  Grave's disease 6.  Fibromyalgia 7.  Depression  PLAN:   1.  Cardiac cath today with coronary angiography only due to underlying renal insufficiency. I have discussed at length with her the risk of  contrast induced nephropathy and risk of worsening renal failure with risk of hemodialysis.  She understands the risk and wants to proceed.  Sodium bicarb gtt is currently running.  Sueanne Margarita, MD  10/02/2011  8:20 AM

## 2011-10-02 NOTE — Op Note (Signed)
PROCEDURE:  Left heart catheterization with selective coronary angiography.  INDICATIONS:  Chest pain with abnormal nuclear stress test showing anterior ischemia and increased transient ischemic dilatation worrisome for underlying 3 vessel ASCAD in setting of known PVD with carotid artery stenosis and ongoing tobacco use.  The risks, benefits, and details of the procedure were explained to the patient.  The patient verbalized understanding and wanted to proceed.  Informed written consent was obtained.  PROCEDURE TECHNIQUE:  After Xylocaine anesthesia a 61F sheath was placed in the right femoral artery with a single anterior needle wall stick.   Left coronary angiography was done using a Judkins L4 guide catheter.  Right coronary angiography was done using a Judkins R4 guide catheter.  Left ventriculography was done using a pigtail catheter.    CONTRAST:  Total of 40 cc.  COMPLICATIONS:  None.    HEMODYNAMICS:  Aortic pressure was 110/48mmHg ; LV pressure was 109/72mmHg; LVEDP 6mmHg.  There was no gradient between the left ventricle and aorta.    ANGIOGRAPHIC DATA:   The left main coronary artery is calcified but widely patent.  The left anterior descending artery is calcified but patent in its proximal portion giving rise to a moderate sized first diagonal which has an ostial 30-40% narrowing.  The mid LAD has a 40-50% mid stenosis prior to the takeoff of the second diagonal.  The second diagonal is moderate in size and widely patent.  The left circumflex artery is calcified with an ostial 30-40% stenosis.  It gives rise to a large first OM branch which is widely patent and bifurcates into 2 smaller vessels both of which are widely patent.  The ongoing left circumflex traverses the AV groove and gives rise to a small OM2 which is widely patent.    The right coronary artery is heavily calcified and widely patent with luminal irregularities.  LEFT VENTRICULOGRAM: Not performed due to underlying  renal insufficiency IMPRESSIONS:  1. Calcified but widely patent left main coronary artery. 2. Calcified left anterior descending artery 40-50% mid stenosis and 30-40% ostial diagonal one branch stenosis. 3. Calcified left circumflex artery with 30-40% ostial stenosis and its branches are normal. 4. Calcified but widely patent right coronary artery. 5. Normal left ventricular systolic function by nuclear stress test.  LV gram not done due to underlying renal insufficiency.    LVEDP 71mmHg consistent with diastolic dysfunction.  RECOMMENDATION:   1.  IVF hydration and bicarb gtt post cath. 2.  Aggressive risk factor modification with aggressive treatment of HTN, tobacco cessation. 3.  D/C home later today after bedrest and fluid hydration are complete.

## 2011-10-02 NOTE — Interval H&P Note (Signed)
History and Physical Interval Note:  10/02/2011 8:51 AM  [name]  has presented today for surgery, with the diagnosis of chest pain  The various methods of treatment have been discussed with the patient and family. After consideration of risks, benefits and other options for treatment, the patient has consented to  Procedure(s): Boyle as a surgical intervention .  The patients' history has been reviewed, patient examined, no change in status, stable for surgery.  I have reviewed the patients' chart and labs.  Questions were answered to the patient's satisfaction.     TURNER,TRACI R

## 2011-10-02 NOTE — H&P (View-Only) (Signed)
SUBJECTIVE:  The patient is doing well today.  She denies any chest pain.  Bicarb gtt for cath is currently running.  OBJECTIVE:   Vitals:   Filed Vitals:   10/01/11 1454 10/01/11 1621 10/01/11 2141 10/02/11 0628  BP:  118/62 99/63 100/63  Pulse:   79 71  Temp:   98.7 F (37.1 C) 98 F (36.7 C)  TempSrc:   Oral Oral  Resp:   18 18  Height: 5\' 6"  (1.676 m)     Weight: 57 kg (125 lb 10.6 oz)     SpO2:   95% 94%   I&O's:   Intake/Output Summary (Last 24 hours) at 10/02/11 0820 Last data filed at 10/02/11 0600  Gross per 24 hour  Intake    900 ml  Output   1600 ml  Net   -700 ml   TELEMETRY: Reviewed telemetry pt in NSR     PHYSICAL EXAM General: Well developed, well nourished, in no acute distress Head: Eyes PERRLA, No xanthomas.   Normal cephalic and atramatic  Lungs:   Clear bilaterally to auscultation and percussion. Heart:   HRRR S1 S2 Pulses are 2+ & equal.            No carotid bruit. No JVD.  No abdominal bruits. No femoral bruits. Abdomen: Bowel sounds are positive, abdomen soft and non-tender without masses or                  Hernia's noted. Msk:  Back normal, normal gait. Normal strength and tone for age. Extremities:   No clubbing, cyanosis or edema.  DP +1 Neuro: Alert and oriented X 3. Psych:  Good affect, responds appropriately   LABS: Basic Metabolic Panel:  Basename 10/02/11 0635 10/01/11 1652  NA 142 139  K 4.0 3.8  CL 106 100  CO2 26 28  GLUCOSE 102* 102*  BUN 37* 38*  CREATININE 1.91* 2.10*  CALCIUM 8.0* 8.5  MG -- 1.9  PHOS -- 3.6   Liver Function Tests:  Basename 10/01/11 1652  AST 16  ALT 9  ALKPHOS 83  BILITOT 0.2*  PROT 6.6  ALBUMIN 3.7   CBC:  Basename 10/01/11 1652  WBC 8.6  NEUTROABS 7.0  HGB 12.6  HCT 37.7  MCV 94.5  PLT 219   Thyroid Function Tests:  Basename 10/01/11 1652  TSH 0.516  T4TOTAL --  T3FREE --  THYROIDAB --   Anemia Panel: No results found for this basename:  VITAMINB12,FOLATE,FERRITIN,TIBC,IRON,RETICCTPCT in the last 72 hours Coag Panel:   Lab Results  Component Value Date   INR 0.96 10/01/2011   INR 0.87 07/12/2010    RADIOLOGY: X-ray Chest Pa And Lateral   10/01/2011  *RADIOLOGY REPORT*  Clinical Data: Preoperative assessment  CHEST - 2 VIEW  Comparison: 07/12/2010  Findings: Normal heart size, mediastinal contours, and pulmonary vascularity. Emphysematous changes with left basilar scarring. No acute infiltrate, pleural effusion, or pneumothorax. Bones appear diffusely demineralized.  IMPRESSION: Emphysematous changes left basilar scarring. No acute abnormalities.  Original Report Authenticated By: Burnetta Sabin, M.D.      ASSESSMENT:  1.  Chest pain with abnormal nuclear stress test  2.  PVD with carotid artery stenosis s/p left  CEA 3.  Ongoing tobacco abuse 4.  Asthma 5.  Grave's disease 6.  Fibromyalgia 7.  Depression  PLAN:   1.  Cardiac cath today with coronary angiography only due to underlying renal insufficiency. I have discussed at length with her the risk of  contrast induced nephropathy and risk of worsening renal failure with risk of hemodialysis.  She understands the risk and wants to proceed.  Sodium bicarb gtt is currently running.  Sueanne Margarita, MD  10/02/2011  8:20 AM

## 2011-10-04 NOTE — Discharge Summary (Signed)
Physician Discharge Summary  Patient ID: [name] MRN: [id number] DOB/AGE: [birth year] 66 y.o.  Admit date: 10/01/2011 Discharge date: 10/04/2011  Admission Diagnoses:Chest pain, abnormal nuclear stress test and renal insufficiency  Discharge Diagnoses:  Noncardiac chest pain Nonobstructive ASCAD Normal LVF COPD with ongoing tobacco abuse Stage III Chronic kidney isease GERD Asthma  Discharged Condition:Stable Hospital Course: This is a pleasant [phone] WF with history of Stage III chronic kidney disease, tobacco abuse, HTN, COPD, HTN and GERD who was initially seen for chest pain and underwent nuclear stress test which showed anterior ischemia and increased TID worrisome for multivessel ASCAD.  She was admitted to the hospital on the day prior to cath for IVF hydration and a bicarb gtt was started 1 hour before her cath.  She underwent cardiac catheterization which showed nonobstructive ASCAD of the LA, Left circumflex and diagonals.  She under went 6 hours of bicarb gtt after the cath and another 5 hours of IVF hydration after which she was discharged to home in stable condition.  During her hospital stay she was counseled on smoking cessation.  Consults:None  Significant Diagnostic Studies Cardic Catheterization  Treatments:IVF hydration and bicarbonate gtt pre and post cath  Discharge Exam: Blood pressure 117/57, pulse 72, temperature 98.4 F (36.9 C), temperature source Oral, resp. rate 18, height 5\' 6"  (1.676 m), weight 57 kg (125 lb 10.6 oz), SpO2 95.00%. PE;  WD WN WF in NAD HEENT:  Benign NECK: supple no LAD LUNGS:  CTA bilaterally HEART:  RRR ABD: soft NT ND active BS EXT: no C/E/E   Disposition: Home or Self Care  Discharge Orders    Future Appointments: Provider: Department: Dept Phone: Center:   03/12/2012 1:30 PM [facility] [phone] [facility]   03/12/2012 2:30 PM Angelia Mould, MD [facility] [phone] [facility]     Future Orders  Please Complete By Expires   Diet - low sodium heart healthy      Increase activity slowly      May shower / Bathe      Driving Restrictions      Scheduling Instructions:   No driving for 24 hours   Comments:   No driving for 24 hours   Lifting restrictions      Comments:   No heavy lifting for 5 days more than 10 pounds   No wound care      Call MD for:  temperature >100.4      Call MD for:  severe uncontrolled pain      Call MD for:  redness, tenderness, or signs of infection (pain, swelling, redness, odor or green/yellow discharge around incision site)      Call MD for:  difficulty breathing, headache or visual disturbances        Discharge Medication List as of 10/02/2011  9:22 PM    CONTINUE these medications which have NOT CHANGED   Details  amLODipine (NORVASC) 5 MG tablet Take 5 mg by mouth daily.  , Until Discontinued, Historical Med    aspirin 81 MG tablet Take 81 mg by mouth daily.  , Until Discontinued, Historical Med    calcium acetate (PHOSLO) 667 MG capsule Take 667 mg by mouth 3 (three) times daily with meals. , Until Discontinued, Historical Med    clonazePAM (KLONOPIN) 1 MG tablet Take 1 mg by mouth at bedtime as needed. For anxiety, Until Discontinued, Historical Med    dicyclomine (BENTYL) 20 MG tablet Take 20 mg by mouth every 6 (six) hours. ,  Until Discontinued, Historical Med    DULoxetine (CYMBALTA) 60 MG capsule Take 60 mg by mouth daily.  , Until Discontinued, Historical Med    fentaNYL (DURAGESIC - DOSED MCG/HR) 75 MCG/HR Place 1 patch onto the skin every other day. , Until Discontinued, Historical Med    hyoscyamine (LEVSIN SL) 0.125 MG SL tablet Place 0.125 mg under the tongue every 4 (four) hours as needed. Before meals as needed , Until Discontinued, Historical Med    metoCLOPramide (REGLAN) 5 MG tablet Take 5 mg by mouth 4 (four) times daily. Take 1 tablet 30 minutes before meals and at bedtime , Until Discontinued, Historical Med      moxifloxacin (VIGAMOX) 0.5 % ophthalmic solution Place 1 drop into both eyes 2 (two) times daily.  , Until Discontinued, Historical Med    nitroGLYCERIN (NITROSTAT) 0.4 MG SL tablet Place 0.4 mg under the tongue every 5 (five) minutes as needed. For chest pain , Until Discontinued, Historical Med    oxyCODONE (OXY IR/ROXICODONE) 5 MG immediate release tablet Take 5 mg by mouth every 4 (four) hours as needed. For pain , Until Discontinued, Historical Med    pantoprazole (PROTONIX) 40 MG tablet Take 40 mg by mouth 2 (two) times daily. , Until Discontinued, Historical Med    predniSONE (DELTASONE) 5 MG tablet Take 5 mg by mouth every other day.  , Until Discontinued, Historical Med    Prenatal Vit-Fe Fumarate-FA (MULTIVITAMIN-PRENATAL) 27-0.8 MG TABS Take 1 tablet by mouth daily.  , Until Discontinued, Historical Med    simvastatin (ZOCOR) 10 MG tablet Take 10 mg by mouth at bedtime.  , Until Discontinued, Historical Med    Vitamin D, Ergocalciferol, (DRISDOL) 50000 UNITS CAPS Take 50,000 Units by mouth every 7 (seven) days. Every saturday, Until Discontinued, Historical Med    albuterol-ipratropium (COMBIVENT) 18-103 MCG/ACT inhaler Inhale 2 puffs into the lungs every 6 (six) hours as needed. For shortness of breath , Until Discontinued, Historical Med       Follow-up Information    Please follow up. (call Dr. Theodosia Blender office on 12/5 to make an appointment to see her in 1 week)          Signed: Neidra Girvan R 10/04/2011, 8:46 PM

## 2011-11-21 ENCOUNTER — Encounter (HOSPITAL_COMMUNITY): Payer: Self-pay | Admitting: Emergency Medicine

## 2011-11-21 ENCOUNTER — Emergency Department (HOSPITAL_COMMUNITY): Payer: Medicare Other

## 2011-11-21 ENCOUNTER — Inpatient Hospital Stay (HOSPITAL_COMMUNITY)
Admission: EM | Admit: 2011-11-21 | Discharge: 2011-11-25 | DRG: 194 | Disposition: A | Discharge status: Home / Self Care | Payer: Medicare Other | Attending: Family Medicine | Admitting: Family Medicine

## 2011-11-21 DIAGNOSIS — F329 Major depressive disorder, single episode, unspecified: Secondary | ICD-10-CM | POA: Diagnosis present

## 2011-11-21 DIAGNOSIS — M549 Dorsalgia, unspecified: Secondary | ICD-10-CM | POA: Diagnosis present

## 2011-11-21 DIAGNOSIS — N179 Acute kidney failure, unspecified: Secondary | ICD-10-CM | POA: Diagnosis present

## 2011-11-21 DIAGNOSIS — E0500 Thyrotoxicosis with diffuse goiter without thyrotoxic crisis or storm: Secondary | ICD-10-CM | POA: Diagnosis present

## 2011-11-21 DIAGNOSIS — IMO0001 Reserved for inherently not codable concepts without codable children: Secondary | ICD-10-CM | POA: Diagnosis present

## 2011-11-21 DIAGNOSIS — R0600 Dyspnea, unspecified: Secondary | ICD-10-CM

## 2011-11-21 DIAGNOSIS — R5383 Other fatigue: Secondary | ICD-10-CM

## 2011-11-21 DIAGNOSIS — I809 Phlebitis and thrombophlebitis of unspecified site: Secondary | ICD-10-CM | POA: Diagnosis present

## 2011-11-21 DIAGNOSIS — R0781 Pleurodynia: Secondary | ICD-10-CM | POA: Diagnosis present

## 2011-11-21 DIAGNOSIS — R338 Other retention of urine: Secondary | ICD-10-CM | POA: Diagnosis not present

## 2011-11-21 DIAGNOSIS — J208 Acute bronchitis due to other specified organisms: Secondary | ICD-10-CM | POA: Diagnosis present

## 2011-11-21 DIAGNOSIS — J45909 Unspecified asthma, uncomplicated: Secondary | ICD-10-CM | POA: Diagnosis present

## 2011-11-21 DIAGNOSIS — F32A Depression, unspecified: Secondary | ICD-10-CM | POA: Diagnosis present

## 2011-11-21 DIAGNOSIS — R7402 Elevation of levels of lactic acid dehydrogenase (LDH): Secondary | ICD-10-CM | POA: Diagnosis present

## 2011-11-21 DIAGNOSIS — R7401 Elevation of levels of liver transaminase levels: Secondary | ICD-10-CM | POA: Diagnosis present

## 2011-11-21 DIAGNOSIS — J440 Chronic obstructive pulmonary disease with acute lower respiratory infection: Secondary | ICD-10-CM | POA: Diagnosis present

## 2011-11-21 DIAGNOSIS — J209 Acute bronchitis, unspecified: Secondary | ICD-10-CM | POA: Diagnosis present

## 2011-11-21 DIAGNOSIS — J449 Chronic obstructive pulmonary disease, unspecified: Secondary | ICD-10-CM

## 2011-11-21 DIAGNOSIS — I6529 Occlusion and stenosis of unspecified carotid artery: Secondary | ICD-10-CM | POA: Diagnosis present

## 2011-11-21 DIAGNOSIS — J09X2 Influenza due to identified novel influenza A virus with other respiratory manifestations: Principal | ICD-10-CM | POA: Diagnosis present

## 2011-11-21 DIAGNOSIS — G8929 Other chronic pain: Secondary | ICD-10-CM | POA: Diagnosis present

## 2011-11-21 DIAGNOSIS — D61818 Other pancytopenia: Secondary | ICD-10-CM | POA: Diagnosis present

## 2011-11-21 DIAGNOSIS — I10 Essential (primary) hypertension: Secondary | ICD-10-CM | POA: Diagnosis present

## 2011-11-21 DIAGNOSIS — F17200 Nicotine dependence, unspecified, uncomplicated: Secondary | ICD-10-CM | POA: Diagnosis present

## 2011-11-21 DIAGNOSIS — T50995A Adverse effect of other drugs, medicaments and biological substances, initial encounter: Secondary | ICD-10-CM | POA: Diagnosis not present

## 2011-11-21 DIAGNOSIS — M797 Fibromyalgia: Secondary | ICD-10-CM | POA: Diagnosis present

## 2011-11-21 LAB — DIFFERENTIAL
Basophils Absolute: 0 10*3/uL (ref 0.0–0.1)
Basophils Relative: 0 % (ref 0–1)
Eosinophils Absolute: 0 10*3/uL (ref 0.0–0.7)
Eosinophils Relative: 0 % (ref 0–5)
Lymphocytes Relative: 21 % (ref 12–46)
Lymphs Abs: 0.6 10*3/uL — ABNORMAL LOW (ref 0.7–4.0)
Monocytes Absolute: 0.3 10*3/uL (ref 0.1–1.0)
Monocytes Relative: 11 % (ref 3–12)
Neutro Abs: 1.8 10*3/uL (ref 1.7–7.7)
Neutrophils Relative %: 67 % (ref 43–77)

## 2011-11-21 LAB — COMPREHENSIVE METABOLIC PANEL
ALT: 370 U/L — ABNORMAL HIGH (ref 0–35)
AST: 186 U/L — ABNORMAL HIGH (ref 0–37)
Albumin: 3.6 g/dL (ref 3.5–5.2)
Alkaline Phosphatase: 258 U/L — ABNORMAL HIGH (ref 39–117)
BUN: 37 mg/dL — ABNORMAL HIGH (ref 6–23)
CO2: 25 mEq/L (ref 19–32)
Calcium: 8.6 mg/dL (ref 8.4–10.5)
Chloride: 93 mEq/L — ABNORMAL LOW (ref 96–112)
Creatinine, Ser: 2.14 mg/dL — ABNORMAL HIGH (ref 0.50–1.10)
GFR calc Af Amer: 27 mL/min — ABNORMAL LOW (ref >90)
GFR calc non Af Amer: 23 mL/min — ABNORMAL LOW (ref >90)
Glucose, Bld: 84 mg/dL (ref 70–99)
Potassium: 3.8 mEq/L (ref 3.5–5.1)
Sodium: 132 mEq/L — ABNORMAL LOW (ref 135–145)
Total Bilirubin: 0.4 mg/dL (ref 0.3–1.2)
Total Protein: 7.1 g/dL (ref 6.0–8.3)

## 2011-11-21 LAB — CBC
HCT: 39.9 % (ref 36.0–46.0)
Hemoglobin: 13.7 g/dL (ref 12.0–15.0)
MCH: 31.6 pg (ref 26.0–34.0)
MCHC: 34.3 g/dL (ref 30.0–36.0)
MCV: 92.1 fL (ref 78.0–100.0)
Platelets: 134 10*3/uL — ABNORMAL LOW (ref 150–400)
RBC: 4.33 MIL/uL (ref 3.87–5.11)
RDW: 12.4 % (ref 11.5–15.5)
WBC: 2.7 10*3/uL — ABNORMAL LOW (ref 4.0–10.5)

## 2011-11-21 MED ORDER — FENTANYL 75 MCG/HR TD PT72
75.0000 ug | MEDICATED_PATCH | TRANSDERMAL | Status: DC
  Administered 2011-11-22 – 2011-11-24 (×2): 75 ug via TRANSDERMAL
  Filled 2011-11-21 (×2): qty 1

## 2011-11-21 MED ORDER — ALBUTEROL SULFATE (5 MG/ML) 0.5% IN NEBU: 2.5000 mg | INHALATION_SOLUTION | RESPIRATORY_TRACT | Status: DC | PRN

## 2011-11-21 MED ORDER — CLONAZEPAM 1 MG PO TABS
1.0000 mg | ORAL_TABLET | Freq: Two times a day (BID) | ORAL | Status: DC | PRN
  Administered 2011-11-21 – 2011-11-25 (×5): 1 mg via ORAL
  Filled 2011-11-21 (×6): qty 1

## 2011-11-21 MED ORDER — SODIUM CHLORIDE 0.9 % IV SOLN: INTRAVENOUS | Status: DC

## 2011-11-21 MED ORDER — HEPARIN SODIUM (PORCINE) 5000 UNIT/ML IJ SOLN
5000.0000 [IU] | Freq: Three times a day (TID) | INTRAMUSCULAR | Status: DC
  Administered 2011-11-21 – 2011-11-25 (×11): 5000 [IU] via SUBCUTANEOUS
  Filled 2011-11-21 (×14): qty 1

## 2011-11-21 MED ORDER — SODIUM CHLORIDE 0.9 % IV BOLUS (SEPSIS)
1000.0000 mL | Freq: Once | INTRAVENOUS | Status: AC
  Administered 2011-11-21: 1000 mL via INTRAVENOUS

## 2011-11-21 MED ORDER — MOXIFLOXACIN HCL IN NACL 400 MG/250ML IV SOLN
400.0000 mg | Freq: Once | INTRAVENOUS | Status: AC
  Administered 2011-11-21: 400 mg via INTRAVENOUS
  Filled 2011-11-21: qty 250

## 2011-11-21 MED ORDER — OSELTAMIVIR PHOSPHATE 75 MG PO CAPS
75.0000 mg | ORAL_CAPSULE | Freq: Every day | ORAL | Status: DC
  Administered 2011-11-21: 75 mg via ORAL
  Filled 2011-11-21 (×2): qty 1

## 2011-11-21 MED ORDER — IPRATROPIUM-ALBUTEROL 18-103 MCG/ACT IN AERO
2.0000 | INHALATION_SPRAY | RESPIRATORY_TRACT | Status: DC | PRN
  Filled 2011-11-21: qty 14.7

## 2011-11-21 MED ORDER — FLUTICASONE PROPIONATE 50 MCG/ACT NA SUSP
2.0000 | Freq: Every day | NASAL | Status: DC
  Administered 2011-11-21 – 2011-11-24 (×4): 2 via NASAL
  Filled 2011-11-21: qty 16

## 2011-11-21 MED ORDER — SODIUM CHLORIDE 0.9 % IV SOLN
INTRAVENOUS | Status: DC
  Administered 2011-11-21 (×2): via INTRAVENOUS

## 2011-11-21 MED ORDER — ONDANSETRON HCL 4 MG/2ML IJ SOLN
4.0000 mg | Freq: Once | INTRAMUSCULAR | Status: AC
  Administered 2011-11-21: 4 mg via INTRAVENOUS
  Filled 2011-11-21: qty 2

## 2011-11-21 MED ORDER — DICYCLOMINE HCL 20 MG PO TABS
20.0000 mg | ORAL_TABLET | Freq: Four times a day (QID) | ORAL | Status: DC
  Administered 2011-11-21 – 2011-11-25 (×14): 20 mg via ORAL
  Filled 2011-11-21 (×20): qty 1

## 2011-11-21 MED ORDER — DULOXETINE HCL 60 MG PO CPEP
60.0000 mg | ORAL_CAPSULE | Freq: Every day | ORAL | Status: DC
  Administered 2011-11-22 – 2011-11-25 (×4): 60 mg via ORAL
  Filled 2011-11-21 (×4): qty 1

## 2011-11-21 MED ORDER — MORPHINE SULFATE 2 MG/ML IJ SOLN
1.0000 mg | INTRAMUSCULAR | Status: DC | PRN
  Administered 2011-11-21 – 2011-11-22 (×2): 1 mg via INTRAVENOUS
  Filled 2011-11-21 (×2): qty 1

## 2011-11-21 MED ORDER — ADULT MULTIVITAMIN W/MINERALS CH
1.0000 | ORAL_TABLET | Freq: Every day | ORAL | Status: DC
  Administered 2011-11-22 – 2011-11-25 (×4): 1 via ORAL
  Filled 2011-11-21 (×4): qty 1

## 2011-11-21 MED ORDER — PANTOPRAZOLE SODIUM 40 MG PO TBEC
40.0000 mg | DELAYED_RELEASE_TABLET | Freq: Two times a day (BID) | ORAL | Status: DC
  Administered 2011-11-21 – 2011-11-25 (×8): 40 mg via ORAL
  Filled 2011-11-21 (×9): qty 1

## 2011-11-21 MED ORDER — ALBUTEROL SULFATE (5 MG/ML) 0.5% IN NEBU
5.0000 mg | INHALATION_SOLUTION | Freq: Once | RESPIRATORY_TRACT | Status: AC
  Administered 2011-11-21: 5 mg via RESPIRATORY_TRACT
  Filled 2011-11-21: qty 1

## 2011-11-21 MED ORDER — ASPIRIN 81 MG PO CHEW
81.0000 mg | CHEWABLE_TABLET | Freq: Every day | ORAL | Status: DC
  Administered 2011-11-22 – 2011-11-25 (×4): 81 mg via ORAL
  Filled 2011-11-21 (×4): qty 1

## 2011-11-21 MED ORDER — IPRATROPIUM BROMIDE 0.02 % IN SOLN
0.5000 mg | Freq: Once | RESPIRATORY_TRACT | Status: AC
  Administered 2011-11-21: 0.5 mg via RESPIRATORY_TRACT
  Filled 2011-11-21: qty 2.5

## 2011-11-21 MED ORDER — VITAMIN D (ERGOCALCIFEROL) 1.25 MG (50000 UNIT) PO CAPS
50000.0000 [IU] | ORAL_CAPSULE | ORAL | Status: DC
  Filled 2011-11-21: qty 1

## 2011-11-21 MED ORDER — IPRATROPIUM BROMIDE 0.02 % IN SOLN
0.5000 mg | RESPIRATORY_TRACT | Status: DC | PRN
  Administered 2011-11-22: 0.5 mg via RESPIRATORY_TRACT
  Filled 2011-11-21: qty 2.5

## 2011-11-21 MED ORDER — PROMETHAZINE HCL 25 MG PO TABS: 12.5000 mg | ORAL_TABLET | Freq: Four times a day (QID) | ORAL | Status: DC | PRN

## 2011-11-21 MED ORDER — METOCLOPRAMIDE HCL 5 MG PO TABS
5.0000 mg | ORAL_TABLET | Freq: Three times a day (TID) | ORAL | Status: DC
  Administered 2011-11-21 – 2011-11-25 (×14): 5 mg via ORAL
  Filled 2011-11-21 (×17): qty 1

## 2011-11-21 MED ORDER — METHYLPREDNISOLONE SODIUM SUCC 125 MG IJ SOLR
125.0000 mg | Freq: Once | INTRAMUSCULAR | Status: AC
  Administered 2011-11-21: 125 mg via INTRAVENOUS
  Filled 2011-11-21: qty 2

## 2011-11-21 MED ORDER — SIMVASTATIN 10 MG PO TABS
10.0000 mg | ORAL_TABLET | Freq: Every day | ORAL | Status: DC
  Administered 2011-11-21: 21:00:00 via ORAL
  Administered 2011-11-22: 10 mg via ORAL
  Filled 2011-11-21 (×4): qty 1

## 2011-11-21 MED ORDER — GATIFLOXACIN 0.5 % OP SOLN
1.0000 [drp] | Freq: Four times a day (QID) | OPHTHALMIC | Status: DC
  Administered 2011-11-21 – 2011-11-25 (×13): 1 [drp] via OPHTHALMIC
  Filled 2011-11-21: qty 2.5

## 2011-11-21 MED ORDER — GUAIFENESIN ER 600 MG PO TB12
600.0000 mg | ORAL_TABLET | Freq: Two times a day (BID) | ORAL | Status: DC
  Administered 2011-11-21 – 2011-11-25 (×8): 600 mg via ORAL
  Filled 2011-11-21 (×9): qty 1

## 2011-11-21 MED ORDER — ONDANSETRON HCL 4 MG/2ML IJ SOLN: 4.0000 mg | Freq: Three times a day (TID) | INTRAMUSCULAR | Status: AC | PRN

## 2011-11-21 MED ORDER — PRENATAL 27-0.8 MG PO TABS: 1.0000 | ORAL_TABLET | Freq: Every day | ORAL | Status: DC

## 2011-11-21 MED ORDER — PREDNISONE 5 MG PO TABS
5.0000 mg | ORAL_TABLET | ORAL | Status: DC
  Administered 2011-11-22 – 2011-11-24 (×2): 5 mg via ORAL
  Filled 2011-11-21 (×2): qty 1

## 2011-11-21 MED ORDER — KETOROLAC TROMETHAMINE 30 MG/ML IJ SOLN
30.0000 mg | Freq: Once | INTRAMUSCULAR | Status: AC
  Administered 2011-11-21: 30 mg via INTRAVENOUS
  Filled 2011-11-21: qty 1

## 2011-11-21 MED ORDER — PROMETHAZINE HCL 25 MG/ML IJ SOLN: 12.5000 mg | Freq: Four times a day (QID) | INTRAMUSCULAR | Status: DC | PRN

## 2011-11-21 MED ORDER — OXYCODONE HCL 5 MG PO TABS
5.0000 mg | ORAL_TABLET | ORAL | Status: DC | PRN
  Administered 2011-11-22 – 2011-11-25 (×9): 5 mg via ORAL
  Filled 2011-11-21 (×9): qty 1

## 2011-11-21 MED ORDER — CALCIUM ACETATE 667 MG PO CAPS
667.0000 mg | ORAL_CAPSULE | Freq: Three times a day (TID) | ORAL | Status: DC
  Administered 2011-11-22 – 2011-11-25 (×10): 667 mg via ORAL
  Filled 2011-11-21 (×12): qty 1

## 2011-11-21 MED ORDER — AMLODIPINE BESYLATE 5 MG PO TABS
5.0000 mg | ORAL_TABLET | Freq: Every day | ORAL | Status: DC
  Administered 2011-11-21 – 2011-11-25 (×4): 5 mg via ORAL
  Filled 2011-11-21 (×5): qty 1

## 2011-11-21 MED ORDER — ALBUTEROL SULFATE (5 MG/ML) 0.5% IN NEBU
2.5000 mg | INHALATION_SOLUTION | RESPIRATORY_TRACT | Status: DC
  Administered 2011-11-21: 2.5 mg via RESPIRATORY_TRACT
  Filled 2011-11-21: qty 0.5

## 2011-11-21 NOTE — ED Notes (Signed)
Report called to [facility].

## 2011-11-21 NOTE — ED Provider Notes (Signed)
History     CSN: [id number]  Arrival date & time 11/21/11  1204   First MD Initiated Contact with Patient 11/21/11 1217      Chief Complaint  Patient presents with  . Cough  . Weakness  . Influenza    (Consider location/radiation/quality/duration/timing/severity/associated sxs/prior treatment) Patient is a 66 y.o. female presenting with URI. The history is provided by the patient. No language interpreter was used.  URI The primary symptoms include fever, fatigue, cough and nausea. Primary symptoms do not include headaches, sore throat, abdominal pain or vomiting. The current episode started 3 to 5 days ago. This is a new problem. The problem has been gradually worsening.  The fever began 2 days ago. The fever has been unchanged since its onset. The maximum temperature recorded prior to her arrival was 101 to 101.9 F. The temperature was taken by an oral thermometer.  The fatigue began 2 days ago. The fatigue has been unchanged since its onset.  The cough began 2 days ago. The cough is new. The cough is dry.  Nausea began 2 days ago.  Symptoms associated with the illness include congestion and rhinorrhea. The illness is not associated with chills.    Past Medical History  Diagnosis Date  . Asthma   . HTN (hypertension)   . Grave's disease   . Fibromyalgia   . Thrombophlebitis   . Depression   . Angina   . Shortness of breath   . COPD (chronic obstructive pulmonary disease)   . Sleep apnea   . Blood dyscrasia     hx of thrombphlebitis  . Recurrent upper respiratory infection (URI)   . GERD (gastroesophageal reflux disease)   . Headache   . Arthritis     Past Surgical History  Procedure Date  . Gastrectomy   . Carotid endarterectomy   . Cholecystectomy     History reviewed. No pertinent family history.  History  Substance Use Topics  . Smoking status: Current Everyday Smoker -- 2.0 packs/day for 50 years    Types: Cigarettes  . Smokeless tobacco: Never Used    . Alcohol Use: No    OB History    Grav Para Term Preterm Abortions TAB SAB Ect Mult Living                  Review of Systems  Constitutional: Positive for fever and fatigue. Negative for chills, activity change and appetite change.  HENT: Positive for congestion, rhinorrhea and neck pain. Negative for sore throat and neck stiffness.   Respiratory: Positive for cough and shortness of breath.   Cardiovascular: Negative for chest pain and palpitations.  Gastrointestinal: Positive for nausea. Negative for vomiting and abdominal pain.  Genitourinary: Negative for dysuria, urgency, frequency and flank pain.  Neurological: Negative for dizziness, weakness, light-headedness, numbness and headaches.  All other systems reviewed and are negative.    Allergies  Biaxin  Home Medications   Current Outpatient Rx  Name Route Sig Dispense Refill  . IPRATROPIUM-ALBUTEROL 18-103 MCG/ACT IN AERO Inhalation Inhale 2 puffs into the lungs every 6 (six) hours as needed. For shortness of breath     . AMLODIPINE BESYLATE 5 MG PO TABS Oral Take 5 mg by mouth daily.      . ASPIRIN 81 MG PO TABS Oral Take 81 mg by mouth daily.      [name] CALCIUM ACETATE 667 MG PO CAPS Oral Take 667 mg by mouth 3 (three) times daily with meals.     [name]  CORICIDIN HBP COLD/FLU PO Oral Take 1 tablet by mouth 2 (two) times daily.    [name] CLONAZEPAM 1 MG PO TABS Oral Take 1 mg by mouth 2 (two) times daily as needed. For anxiety    . DICYCLOMINE HCL 20 MG PO TABS Oral Take 20 mg by mouth every 6 (six) hours.     . DULOXETINE HCL 60 MG PO CPEP Oral Take 60 mg by mouth daily.      . FENTANYL 75 MCG/HR TD PT72 Transdermal Place 1 patch onto the skin every other day.     [name] FLUTICASONE PROPIONATE 50 MCG/ACT NA SUSP Nasal Place 2 sprays into the nose at bedtime.    . GUAIFENESIN ER 600 MG PO TB12 Oral Take 600 mg by mouth 2 (two) times daily.    [name] HYOSCYAMINE SULFATE 0.125 MG SL SUBL Sublingual Place 0.125 mg under the tongue every 4 (four)  hours as needed. Before meals as needed    . METOCLOPRAMIDE HCL 5 MG PO TABS Oral Take 5 mg by mouth 4 (four) times daily. Take 1 tablet 30 minutes before meals and at bedtime     . MOXIFLOXACIN HCL 0.5 % OP SOLN Both Eyes Place 1 drop into both eyes 2 (two) times daily.      [name] NITROGLYCERIN 0.4 MG SL SUBL Sublingual Place 0.4 mg under the tongue every 5 (five) minutes as needed. For chest pain     . OXYCODONE HCL 5 MG PO TABS Oral Take 5 mg by mouth every 4 (four) hours as needed. For pain     . PANTOPRAZOLE SODIUM 40 MG PO TBEC Oral Take 40 mg by mouth 2 (two) times daily.     [name] PREDNISONE 5 MG PO TABS Oral Take 5 mg by mouth every other day.      [name] PRENATAL 27-0.8 MG PO TABS Oral Take 1 tablet by mouth daily.      [name] SIMVASTATIN 10 MG PO TABS Oral Take 10 mg by mouth at bedtime.      [name] VITAMIN D (ERGOCALCIFEROL) 50000 UNITS PO CAPS Oral Take 50,000 Units by mouth every 7 (seven) days. Every saturday      BP 109/42  Pulse 116  Temp(Src) 98.5 F (36.9 C) (Oral)  Resp 18  Ht 5\' 6"  (1.676 m)  Wt 125 lb (56.7 kg)  BMI 20.18 kg/m2  SpO2 89%  Physical Exam  Nursing note and vitals reviewed. Constitutional: She is oriented to person, place, and time. She appears well-developed and well-nourished.       Appears uncomfortable  HENT:  Head: Normocephalic and atraumatic.  Mouth/Throat: Oropharynx is clear and moist.  Eyes: Conjunctivae and EOM are normal. Pupils are equal, round, and reactive to light.  Neck: Normal range of motion. Neck supple.  Cardiovascular: Normal rate, regular rhythm, normal heart sounds and intact distal pulses.  Exam reveals no gallop and no friction rub.   No murmur heard. Pulmonary/Chest: Effort normal. No respiratory distress. She has no wheezes.       Diminished breath sounds  Abdominal: Soft. Bowel sounds are normal. There is no tenderness.  Musculoskeletal: Normal range of motion. She exhibits no tenderness.  Neurological: She is alert and oriented to person,  place, and time. No cranial nerve deficit.  Skin: Skin is warm and dry. No rash noted.    ED Course  Procedures (including critical care time)   Date: 11/21/2011  Rate: 89  Rhythm: normal sinus rhythm  QRS Axis: normal  Intervals: normal  ST/T Wave abnormalities: normal  Conduction Disutrbances:none  Narrative Interpretation:   Old EKG Reviewed: unchanged  Labs Reviewed  CBC - Abnormal; Notable for the following:    WBC 2.7 (*)    Platelets 134 (*)    All other components within normal limits  DIFFERENTIAL - Abnormal; Notable for the following:    Lymphs Abs 0.6 (*)    All other components within normal limits  COMPREHENSIVE METABOLIC PANEL - Abnormal; Notable for the following:    Sodium 132 (*)    Chloride 93 (*)    BUN 37 (*)    Creatinine, Ser 2.14 (*)    AST 186 (*)    ALT 370 (*)    Alkaline Phosphatase 258 (*)    GFR calc non Af Amer 23 (*)    GFR calc Af Amer 27 (*)    All other components within normal limits  URINALYSIS, ROUTINE W REFLEX MICROSCOPIC  INFLUENZA PANEL BY PCR   Dg Chest 2 View  11/21/2011  *RADIOLOGY REPORT*  Clinical Data: Cough, congestion, fever  CHEST - 2 VIEW  Comparison: 10/01/2011  Findings: Chronic interstitial markings/emphysematous changes. Left basilar scarring. No pleural effusion or pneumothorax.  Cardiomediastinal silhouette is within normal limits.  Mild degenerative changes of the visualized thoracolumbar spine.  Surgical clips at the GE junction.  IMPRESSION: No evidence of acute cardiopulmonary disease.  Chronic interstitial markings/emphysematous changes with left basilar scarring.  Original Report Authenticated By: Julian Hy, M.D.     1. Dyspnea   2. Fatigue   3. COPD (chronic obstructive pulmonary disease)       MDM  Dyspnea likely secondary to COPD exacerbation and possible influenza. She'll be covered with Tamiflu and Avelox. She received albuterol and steroids. She also has elevation of her transaminases which  will be monitored. I administered IV fluids. She has new oxygen requirement which requires further evaluation and treatment. I discussed with the hospitalist who will admit the patient for further evaluation to a monitored bed        Trisha Mangle, MD 11/21/11 [phone]

## 2011-11-21 NOTE — ED Notes (Signed)
MD at bedside. 

## 2011-11-21 NOTE — ED Notes (Signed)
[id number] Expected date:11/21/11<BR> Expected time:12:05 PM<BR> Means of arrival:Ambulance<BR> Comments:<BR> EMS 40 GC, 64 yof influenza, fever 101

## 2011-11-21 NOTE — ED Notes (Signed)
Patient with intermittant deep cough.  Coarse rhonchi throughout left and right lung bases.  Pt. Alert and oriented.  No respiratory distress.  O2 sats 96% on 2 L O2

## 2011-11-21 NOTE — ED Notes (Signed)
Pt to ED by stretcher with EMS.  Pt is alert and oriented X4.  Reports cough, runny nose, congestion, and neck pain.

## 2011-11-21 NOTE — Progress Notes (Signed)
Dr Sarajane Jews informed of pt transferred to this unit.

## 2011-11-21 NOTE — H&P (Signed)
[name] is an 66 y.o. female.    CC-Generalized Malaise HPI- Patient is a 66 yr old CF with a h/o Tob abuse, hypertension fibromyalgia thyroid disease asthma Vs. COPD and recent L heart cath 09/2011 showing  showed nonobstructive ASCAD of the LA, Left circumflex and diagonals  states that she has been Sick for couple of weeks.  Was sick one time and had Vomiting and diarrhoea.  The week before that just had the vomiting.   Cannot get around in her house..  Was around her Aide who had the Flu.  States that she had the Flu shot in Sept/Oct.  Has an Aide at home to help with work.  Her friend finally talked her in to coming to the emergancy room-as she was"not getting any better" just worse.    Right now feels shaky and hurting.  Does take medicaitons for her Fibromyalgia-takes Fentanly patches-and also has pain meds OxyIR as well.  Has a cough and has had a fever as well-started Saturday night.  Measured at 101.5.  Took nothing for this-didn't feel good enough to do anything..  No diarrhoea.  Had diarrhoea and vomiting 5the week before-none now.  Has some mild nausea now.-this is not unusual for her.  No dysuria-Cannot pass urine per her. No curent CP, blurred vision, + Neck pain and the back of her head hurts-feels like someone slamming something into the back of her head.   Past Medical History  Diagnosis Date  . Asthma   . HTN (hypertension)   . Grave's disease   . Fibromyalgia   . Thrombophlebitis   . Depression   . Angina   . Shortness of breath   . COPD (chronic obstructive pulmonary disease)   . Sleep apnea   . Blood dyscrasia     hx of thrombphlebitis  . Recurrent upper respiratory infection (URI)   . GERD (gastroesophageal reflux disease)   . Headache   . Arthritis     Past Surgical History  Procedure Date  . Gastrectomy   . Carotid endarterectomy   . Cholecystectomy     History reviewed. No pertinent family history. Social History:  reports that she has been  smoking Cigarettes.  She has a 100 pack-year smoking history. She has never used smokeless tobacco. She reports that she does not drink alcohol or use illicit drugs.  Allergies:  Allergies  Allergen Reactions  . Biaxin Nausea Only    Medications Prior to Admission  Medication Dose Route Frequency Provider Last Rate Last Dose  . 0.9 %  sodium chloride infusion   Intravenous Continuous Trisha Mangle, MD 125 mL/hr at 11/21/11 1400    . albuterol (PROVENTIL) (5 MG/ML) 0.5% nebulizer solution 5 mg  5 mg Nebulization Once Trisha Mangle, MD   5 mg at 11/21/11 1251  . albuterol (PROVENTIL) (5 MG/ML) 0.5% nebulizer solution 5 mg  5 mg Nebulization Once Trisha Mangle, MD   5 mg at 11/21/11 1407  . ipratropium (ATROVENT) nebulizer solution 0.5 mg  0.5 mg Nebulization Once Trisha Mangle, MD   0.5 mg at 11/21/11 1408  . ketorolac (TORADOL) 30 MG/ML injection 30 mg  30 mg Intravenous Once Trisha Mangle, MD   30 mg at 11/21/11 1359  . methylPREDNISolone sodium succinate (SOLU-MEDROL) 125 MG injection 125 mg  125 mg Intravenous Once Trisha Mangle, MD   125 mg at 11/21/11 1359  . moxifloxacin (AVELOX) IVPB 400 mg  400 mg Intravenous Once Trisha Mangle, MD      .  ondansetron (ZOFRAN) injection 4 mg  4 mg Intravenous Once Trisha Mangle, MD   4 mg at 11/21/11 1359  . oseltamivir (TAMIFLU) capsule 75 mg  75 mg Oral Daily Trisha Mangle, MD      . sodium chloride 0.9 % bolus 1,000 mL  1,000 mL Intravenous Once Trisha Mangle, MD   1,000 mL at 11/21/11 1446   Medications Prior to Admission  Medication Sig Dispense Refill  . albuterol-ipratropium (COMBIVENT) 18-103 MCG/ACT inhaler Inhale 2 puffs into the lungs every 6 (six) hours as needed. For shortness of breath       . amLODipine (NORVASC) 5 MG tablet Take 5 mg by mouth daily.        [name] aspirin 81 MG tablet Take 81 mg by mouth daily.        . calcium acetate (PHOSLO) 667 MG capsule Take 667 mg by mouth 3 (three) times daily with meals.       . clonazePAM (KLONOPIN) 1 MG tablet Take  1 mg by mouth 2 (two) times daily as needed. For anxiety      . dicyclomine (BENTYL) 20 MG tablet Take 20 mg by mouth every 6 (six) hours.       . DULoxetine (CYMBALTA) 60 MG capsule Take 60 mg by mouth daily.        . fentaNYL (DURAGESIC - DOSED MCG/HR) 75 MCG/HR Place 1 patch onto the skin every other day.       . hyoscyamine (LEVSIN SL) 0.125 MG SL tablet Place 0.125 mg under the tongue every 4 (four) hours as needed. Before meals as needed      . metoCLOPramide (REGLAN) 5 MG tablet Take 5 mg by mouth 4 (four) times daily. Take 1 tablet 30 minutes before meals and at bedtime       . moxifloxacin (VIGAMOX) 0.5 % ophthalmic solution Place 1 drop into both eyes 2 (two) times daily.        . nitroGLYCERIN (NITROSTAT) 0.4 MG SL tablet Place 0.4 mg under the tongue every 5 (five) minutes as needed. For chest pain       . oxyCODONE (OXY IR/ROXICODONE) 5 MG immediate release tablet Take 5 mg by mouth every 4 (four) hours as needed. For pain       . pantoprazole (PROTONIX) 40 MG tablet Take 40 mg by mouth 2 (two) times daily.       . predniSONE (DELTASONE) 5 MG tablet Take 5 mg by mouth every other day.        . Prenatal Vit-Fe Fumarate-FA (MULTIVITAMIN-PRENATAL) 27-0.8 MG TABS Take 1 tablet by mouth daily.        . simvastatin (ZOCOR) 10 MG tablet Take 10 mg by mouth at bedtime.        . Vitamin D, Ergocalciferol, (DRISDOL) 50000 UNITS CAPS Take 50,000 Units by mouth every 7 (seven) days. Every saturday        Results for orders placed during the hospital encounter of 11/21/11 (from the past 48 hour(s))  CBC     Status: Abnormal   Collection Time   11/21/11 12:50 PM      Component Value Range Comment   WBC 2.7 (*) 4.0 - 10.5 (K/uL)    RBC 4.33  3.87 - 5.11 (MIL/uL)    Hemoglobin 13.7  12.0 - 15.0 (g/dL)    HCT 39.9  36.0 - 46.0 (%)    MCV 92.1  78.0 - 100.0 (fL)    MCH 31.6  26.0 - 34.0 (  pg)    MCHC 34.3  30.0 - 36.0 (g/dL)    RDW 12.4  11.5 - 15.5 (%)    Platelets 134 (*) 150 - 400 (K/uL)     DIFFERENTIAL     Status: Abnormal   Collection Time   11/21/11 12:50 PM      Component Value Range Comment   Neutrophils Relative 67  43 - 77 (%)    Neutro Abs 1.8  1.7 - 7.7 (K/uL)    Lymphocytes Relative 21  12 - 46 (%)    Lymphs Abs 0.6 (*) 0.7 - 4.0 (K/uL)    Monocytes Relative 11  3 - 12 (%)    Monocytes Absolute 0.3  0.1 - 1.0 (K/uL)    Eosinophils Relative 0  0 - 5 (%)    Eosinophils Absolute 0.0  0.0 - 0.7 (K/uL)    Basophils Relative 0  0 - 1 (%)    Basophils Absolute 0.0  0.0 - 0.1 (K/uL)   COMPREHENSIVE METABOLIC PANEL     Status: Abnormal   Collection Time   11/21/11 12:50 PM      Component Value Range Comment   Sodium 132 (*) 135 - 145 (mEq/L)    Potassium 3.8  3.5 - 5.1 (mEq/L)    Chloride 93 (*) 96 - 112 (mEq/L)    CO2 25  19 - 32 (mEq/L)    Glucose, Bld 84  70 - 99 (mg/dL)    BUN 37 (*) 6 - 23 (mg/dL)    Creatinine, Ser 2.14 (*) 0.50 - 1.10 (mg/dL)    Calcium 8.6  8.4 - 10.5 (mg/dL)    Total Protein 7.1  6.0 - 8.3 (g/dL)    Albumin 3.6  3.5 - 5.2 (g/dL)    AST 186 (*) 0 - 37 (U/L)    ALT 370 (*) 0 - 35 (U/L)    Alkaline Phosphatase 258 (*) 39 - 117 (U/L)    Total Bilirubin 0.4  0.3 - 1.2 (mg/dL)    GFR calc non Af Amer 23 (*) >90 (mL/min)    GFR calc Af Amer 27 (*) >90 (mL/min)    Dg Chest 2 View  11/21/2011  *RADIOLOGY REPORT*  Clinical Data: Cough, congestion, fever  CHEST - 2 VIEW  Comparison: 10/01/2011  Findings: Chronic interstitial markings/emphysematous changes. Left basilar scarring. No pleural effusion or pneumothorax.  Cardiomediastinal silhouette is within normal limits.  Mild degenerative changes of the visualized thoracolumbar spine.  Surgical clips at the GE junction.  IMPRESSION: No evidence of acute cardiopulmonary disease.  Chronic interstitial markings/emphysematous changes with left basilar scarring.  Original Report Authenticated By: Julian Hy, M.D.    ROS 14 point review of systems was performed and is as per above in the history of  present illness  Blood pressure 109/42, pulse 116, temperature 98.5 F (36.9 C), temperature source Oral, resp. rate 18, height 5\' 6"  (1.676 m), weight 56.7 kg (125 lb), SpO2 89.00%. Physical Exam  Constitutional: She is oriented to person, place, and time. She appears well-developed.  HENT:  Head: Normocephalic and atraumatic.  Eyes: EOM are normal. Pupils are equal, round, and reactive to light.  Neck: Normal range of motion. Neck supple. No JVD present. No thyromegaly present.  Cardiovascular:  Murmur: 3/6 mmurmur in the apex. Respiratory: No stridor. No respiratory distress. She has wheezes. She has rales. She exhibits no tenderness.  GI: Soft. Bowel sounds are normal.  Musculoskeletal: She exhibits no edema.  Neurological: She is alert and oriented to  person, place, and time. She has normal reflexes. No cranial nerve deficit.  Skin: Skin is warm and dry.  Psychiatric:       Depressed affect with shakiness     Assessment/Plan Patient Active Problem List  Diagnoses  . Viral bronchitis-possible h. infl vs Norovirus    1 viral illness-she has likely Norovirus versus H. influenza by history given the fact that she's had fevers as well as diarrhea. If this persists we will workup the diarrhea she separately for infectious diarrhea causes. She is not able to give me a very clear history and am having difficulty understanding neurology of the events that led up to this. She has multiple somatic complaints and states that right now her main complaint is back pain. Will continue her regular scheduled opiates and repeat labs in the morning as well as a chest x-ray. Patient will be kept on isolation precautions and will be reviewed in the morning. She will continue Tamiflu as well as Avelox-If her white count is in thye normal range in am, iot would be reasonable to discontinue Avelox, especially if her chest x-ray is non-conclusive. He will be admitted as an observation patient overnight to  delineate how she does.  2.COPD/asthma patient was kept on IV Solu-Medrol as well as albuterol and ipratropium nebs.  She will have a repeat chest x-ray in the morning as per above  3.Hypertension patient will continue regular scheduled medications which have been reordered  4. Graves' disease-patient will continue his regular schedule Synthroid.  5. Patient will continue home medications inclusive of Cymbalta as well as fentanyl patch and OxyIR.  6. Tobacco abuse patient is on Chantix and would benefit from tobacco cessation consult in the morning.  7. Is nonobstructive coronary artery disease per recent cardiac catheterization will need to secondary prevention measures.   Over 40 minutes  Raynold Blankenbaker,JAI 11/21/2011, 3:38 PM

## 2011-11-22 ENCOUNTER — Inpatient Hospital Stay (HOSPITAL_COMMUNITY): Payer: Medicare Other

## 2011-11-22 ENCOUNTER — Other Ambulatory Visit (HOSPITAL_COMMUNITY): Payer: Medicare Other

## 2011-11-22 ENCOUNTER — Other Ambulatory Visit: Payer: Self-pay

## 2011-11-22 LAB — COMPREHENSIVE METABOLIC PANEL
ALT: 254 U/L — ABNORMAL HIGH (ref 0–35)
AST: 101 U/L — ABNORMAL HIGH (ref 0–37)
Albumin: 2.7 g/dL — ABNORMAL LOW (ref 3.5–5.2)
Alkaline Phosphatase: 197 U/L — ABNORMAL HIGH (ref 39–117)
BUN: 39 mg/dL — ABNORMAL HIGH (ref 6–23)
CO2: 21 mEq/L (ref 19–32)
Calcium: 7.8 mg/dL — ABNORMAL LOW (ref 8.4–10.5)
Chloride: 107 mEq/L (ref 96–112)
Creatinine, Ser: 1.78 mg/dL — ABNORMAL HIGH (ref 0.50–1.10)
GFR calc Af Amer: 33 mL/min — ABNORMAL LOW (ref >90)
GFR calc non Af Amer: 29 mL/min — ABNORMAL LOW (ref >90)
Glucose, Bld: 242 mg/dL — ABNORMAL HIGH (ref 70–99)
Potassium: 3.6 mEq/L (ref 3.5–5.1)
Sodium: 141 mEq/L (ref 135–145)
Total Bilirubin: 0.2 mg/dL — ABNORMAL LOW (ref 0.3–1.2)
Total Protein: 5.8 g/dL — ABNORMAL LOW (ref 6.0–8.3)

## 2011-11-22 LAB — URINALYSIS, ROUTINE W REFLEX MICROSCOPIC
Bilirubin Urine: NEGATIVE
Glucose, UA: 500 mg/dL — AB
Hgb urine dipstick: NEGATIVE
Leukocytes, UA: NEGATIVE
Nitrite: NEGATIVE
Protein, ur: NEGATIVE mg/dL
Specific Gravity, Urine: 1.017 (ref 1.005–1.030)
Urobilinogen, UA: 0.2 mg/dL (ref 0.0–1.0)
pH: 5.5 (ref 5.0–8.0)

## 2011-11-22 LAB — CBC
HCT: 35.6 % — ABNORMAL LOW (ref 36.0–46.0)
Hemoglobin: 12.1 g/dL (ref 12.0–15.0)
MCH: 31.1 pg (ref 26.0–34.0)
MCHC: 34 g/dL (ref 30.0–36.0)
MCV: 91.5 fL (ref 78.0–100.0)
Platelets: 130 10*3/uL — ABNORMAL LOW (ref 150–400)
RBC: 3.89 MIL/uL (ref 3.87–5.11)
RDW: 12.7 % (ref 11.5–15.5)
WBC: 2.9 10*3/uL — ABNORMAL LOW (ref 4.0–10.5)

## 2011-11-22 LAB — INFLUENZA PANEL BY PCR (TYPE A & B)
H1N1 flu by pcr: NOT DETECTED
Influenza A By PCR: POSITIVE — AB
Influenza B By PCR: NEGATIVE

## 2011-11-22 LAB — GLUCOSE, CAPILLARY: Glucose-Capillary: 293 mg/dL — ABNORMAL HIGH (ref 70–99)

## 2011-11-22 MED ORDER — SODIUM CHLORIDE 0.9 % IV SOLN
INTRAVENOUS | Status: AC
  Administered 2011-11-22: 17:00:00 via INTRAVENOUS

## 2011-11-22 MED ORDER — ALBUTEROL SULFATE (5 MG/ML) 0.5% IN NEBU: 2.5000 mg | INHALATION_SOLUTION | RESPIRATORY_TRACT | Status: DC | PRN

## 2011-11-22 MED ORDER — ALBUTEROL SULFATE (5 MG/ML) 0.5% IN NEBU
2.5000 mg | INHALATION_SOLUTION | Freq: Four times a day (QID) | RESPIRATORY_TRACT | Status: DC
  Administered 2011-11-22 – 2011-11-23 (×7): 2.5 mg via RESPIRATORY_TRACT
  Filled 2011-11-22 (×7): qty 0.5

## 2011-11-22 MED ORDER — OSELTAMIVIR PHOSPHATE 75 MG PO CAPS
75.0000 mg | ORAL_CAPSULE | Freq: Two times a day (BID) | ORAL | Status: DC
  Administered 2011-11-22 – 2011-11-25 (×7): 75 mg via ORAL
  Filled 2011-11-22 (×8): qty 1

## 2011-11-22 NOTE — Progress Notes (Signed)
UR CHART REVIEWED; B Lankford Gutzmer RN, BSN, MHA 

## 2011-11-22 NOTE — Progress Notes (Deleted)
UR CHART REVIEWED; B Kian Gamarra RN, BSN, MHA 

## 2011-11-22 NOTE — Progress Notes (Signed)
Bladder scan shows 557 cc Dr informed i/o cath compete obtained 600cc of amber colored urine Dr informed.

## 2011-11-22 NOTE — Progress Notes (Signed)
Inpatient Diabetes Program Recommendations  AACE/ADA: New Consensus Statement on Inpatient Glycemic Control (2009)  Target Ranges:  Prepandial:   less than 140 mg/dL      Peak postprandial:   less than 180 mg/dL (1-2 hours)      Critically ill patients:  140 - 180 mg/dL   Reason for Visit: Hyperglycemia  Inpatient Diabetes Program Recommendations HgbA1C: Check HgbA1C to assess glucose control prior to hospitalization  Note: Lab glucose 1/24 - 242. CBG - 293.

## 2011-11-22 NOTE — Progress Notes (Signed)
[name] [id number] is a 66 y.o. female,  Outpatient Primary MD for the patient is Thressa Sheller, MD, MD  Chief Complaint  Patient presents with  . Cough  . Weakness  . Influenza        Subjective:   [name] today has, No headache, No chest pain, No abdominal pain - No Nausea, No new weakness tingling or numbness, Improved Cough , mild to no  SOB   Objective:   Filed Vitals:   11/21/11 2039 11/21/11 2042 11/21/11 2130 11/22/11 0559  BP:   94/58 100/57  Pulse:   86 85  Temp:   96.1 F (35.6 C) 96.2 F (35.7 C)  TempSrc:   Axillary Axillary  Resp:   16 16  Height:      Weight:      SpO2: 86% 94% 97% 94%    Wt Readings from Last 3 Encounters:  11/21/11 56.8 kg (125 lb 3.5 oz)  10/01/11 57 kg (125 lb 10.6 oz)  10/01/11 57 kg (125 lb 10.6 oz)     Intake/Output Summary (Last 24 hours) at 11/22/11 1011 Last data filed at 11/22/11 0600  Gross per 24 hour  Intake   2120 ml  Output    300 ml  Net   1820 ml    Exam Awake Alert, Oriented *3, No new F.N deficits, Normal affect [place].AT,PERRAL Supple Neck,No JVD, No cervical lymphadenopathy appriciated.  Symmetrical Chest wall movement, Good air movement bilaterally, CTAB RRR,No Gallops,Rubs or new Murmurs, No Parasternal Heave +ve B.Sounds, Abd Soft, Non tender, No organomegaly appriciated, No rebound -guarding or rigidity. No Cyanosis, Clubbing or edema, No new Rash or bruise     Data Review  CBC  Lab 11/22/11 0455 11/21/11 1250  WBC 2.9* 2.7*  HGB 12.1 13.7  HCT 35.6* 39.9  PLT 130* 134*  MCV 91.5 92.1  MCH 31.1 31.6  MCHC 34.0 34.3  RDW 12.7 12.4  LYMPHSABS -- 0.6*  MONOABS -- 0.3  EOSABS -- 0.0  BASOSABS -- 0.0  BANDABS -- --    Chemistries   Lab 11/22/11 0455 11/21/11 1250  NA 141 132*  K 3.6 3.8  CL 107 93*  CO2 21 25  GLUCOSE 242* 84  BUN 39* 37*  CREATININE 1.78* 2.14*  CALCIUM 7.8* 8.6  MG -- --  AST 101* 186*  ALT 254* 370*  ALKPHOS 197* 258*  BILITOT 0.2*  0.4   ------------------------------------------------------------------------------------------------------------------ estimated creatinine clearance is 27.9 ml/min (by C-G formula based on Cr of 1.78). ------------------------------------------------------------------------------------------------------------------ No results found for this basename: HGBA1C:2 in the last 72 hours ------------------------------------------------------------------------------------------------------------------ No results found for this basename: CHOL:2,HDL:2,LDLCALC:2,TRIG:2,CHOLHDL:2,LDLDIRECT:2 in the last 72 hours ------------------------------------------------------------------------------------------------------------------ No results found for this basename: TSH,T4TOTAL,FREET3,T3FREE,THYROIDAB in the last 72 hours ------------------------------------------------------------------------------------------------------------------ No results found for this basename: VITAMINB12:2,FOLATE:2,FERRITIN:2,TIBC:2,IRON:2,RETICCTPCT:2 in the last 72 hours  Coagulation profile No results found for this basename: INR:5,PROTIME:5 in the last 168 hours  No results found for this basename: DDIMER:2 in the last 72 hours  Cardiac Enzymes No results found for this basename: CK:3,CKMB:3,TROPONINI:3,MYOGLOBIN:3 in the last 168 hours ------------------------------------------------------------------------------------------------------------------ No components found with this basename: POCBNP:3  Micro Results No results found for this or any previous visit (from the past 240 hour(s)).  Radiology Reports Dg Chest 2 View  11/22/2011  *RADIOLOGY REPORT*  Clinical Data: History of cough and congestion.  Fevers, chills and body aches.  Possible pneumonia.  CHEST - 2 VIEW  Comparison: Multiple priors, most recently 11/21/2011.  Findings: Lung volumes are normal.  There  are bibasilar opacities that are predominately linear  appearance, favored to represent areas of atelectasis.  Strictly speaking, early areas of airspace consolidation from infection or aspiration would be difficult to exclude.  Minimal blunting of the left costophrenic sulcus may suggest a trace left effusion.  Pulmonary vasculature is normal. Cardiomediastinal silhouette is within normal limits.  Surgical clips near the GE junction and in the right upper quadrant of the abdomen.  IMPRESSION:  1.  Bibasilar opacities favored to represent areas of atelectasis. Strictly speaking, areas of early airspace consolidation from pneumonia or aspiration would be difficult to exclude. 2.  Possible small left pleural effusion.  Original Report Authenticated By: Etheleen Mayhew, M.D.   Dg Chest 2 View  11/21/2011  *RADIOLOGY REPORT*  Clinical Data: Cough, congestion, fever  CHEST - 2 VIEW  Comparison: 10/01/2011  Findings: Chronic interstitial markings/emphysematous changes. Left basilar scarring. No pleural effusion or pneumothorax.  Cardiomediastinal silhouette is within normal limits.  Mild degenerative changes of the visualized thoracolumbar spine.  Surgical clips at the GE junction.  IMPRESSION: No evidence of acute cardiopulmonary disease.  Chronic interstitial markings/emphysematous changes with left basilar scarring.  Original Report Authenticated By: Julian Hy, M.D.    Scheduled Meds:   . albuterol  2.5 mg Nebulization QID  . albuterol  5 mg Nebulization Once  . albuterol  5 mg Nebulization Once  . amLODipine  5 mg Oral Daily  . aspirin  81 mg Oral Daily  . calcium acetate  667 mg Oral TID WC  . dicyclomine  20 mg Oral Q6H  . DULoxetine  60 mg Oral Daily  . fentaNYL  75 mcg Transdermal QODAY  . fluticasone  2 spray Each Nare QHS  . gatifloxacin  1 drop Both Eyes QID  . guaiFENesin  600 mg Oral BID  . heparin  5,000 Units Subcutaneous Q8H  . ipratropium  0.5 mg Nebulization Once  . ketorolac  30 mg Intravenous Once  . methylPREDNISolone  (SOLU-MEDROL) injection  125 mg Intravenous Once  . metoCLOPramide  5 mg Oral TID AC & HS  . moxifloxacin  400 mg Intravenous Once  . mulitivitamin with minerals  1 tablet Oral Daily  . ondansetron  4 mg Intravenous Once  . oseltamivir  75 mg Oral BID  . pantoprazole  40 mg Oral BID  . predniSONE  5 mg Oral QODAY  . simvastatin  10 mg Oral QHS  . sodium chloride  1,000 mL Intravenous Once  . Vitamin D (Ergocalciferol)  50,000 Units Oral Q7 days  . DISCONTD: sodium chloride   Intravenous STAT  . DISCONTD: albuterol  2.5 mg Nebulization Q4H  . DISCONTD: multivitamin-prenatal  1 tablet Oral Daily  . DISCONTD: oseltamivir  75 mg Oral Daily   Continuous Infusions:   . sodium chloride    . DISCONTD: sodium chloride 125 mL/hr at 11/21/11 1645   PRN Meds:.albuterol, clonazePAM, ipratropium, morphine, ondansetron (ZOFRAN) IV, oxyCODONE, promethazine, promethazine, DISCONTD: albuterol, DISCONTD: albuterol-ipratropium  Assessment & Plan    #1. Upper respiratory infection-chest x-ray is clear, patient is afebrile her cough and shortness of breath are much better, we'll change her to therapeutic doses of Tamiflu she has received one dose so far, nebulizer and oxygen treatments when necessary, involve PT and OT and increase her activity. No need for antibiotics or IV steroids. Gentle IV fluids to continue.    2.COPD/asthma- exam reveals no wheezing at all, she is on every other day prednisone which will be continued for now no  IV steroids or antibiotics. When necessary nebulizer treatments and oxygen as needed.   3.Hypertension -on Norvasc blood pressure is slightly on the lower side have written holding parameters.   4. Graves' disease-patient will continue his regular schedule Synthroid. 10 outpatient monitor by primary care physician.   5. chronic back pain  - Patient will continue home medications inclusive of Cymbalta as well as fentanyl patch and OxyIR. He is comfortable and  pain-free.   6. Tobacco abuse patient is on Chantix and and have consulted smoking cessation counselor.   7. nonobstructive CAD- counseled for smoking, outpatient cardiology follow.   8. Urinary retention likely due in activity and use of narcotics-  she will be straight cathed now as 500 cc in the bladder, post void bladder scan again in 3-4 hours if postvoid residual was over 300 cc we'll place Foley, have encouraged her to increase her activity and use narcotics as minimally as possible.   9. Acute renal insufficiency due to viral illness and recently dissolved nausea vomiting and diarrhea- continue gentle hydration avoid nephrotoxic medications.   10. Mild leukopenia and likely due to the viral illness monitor. Trend is improving.   DVT Prophylaxis  Heparin   See all Orders from today for further details     Thurnell Lose M.D on 11/22/2011 at 10:11 AM  [facility] Office  [phone]

## 2011-11-23 LAB — BASIC METABOLIC PANEL
BUN: 26 mg/dL — ABNORMAL HIGH (ref 6–23)
CO2: 22 mEq/L (ref 19–32)
Calcium: 8 mg/dL — ABNORMAL LOW (ref 8.4–10.5)
Chloride: 111 mEq/L (ref 96–112)
Creatinine, Ser: 1.35 mg/dL — ABNORMAL HIGH (ref 0.50–1.10)
GFR calc Af Amer: 46 mL/min — ABNORMAL LOW (ref >90)
GFR calc non Af Amer: 40 mL/min — ABNORMAL LOW (ref >90)
Glucose, Bld: 86 mg/dL (ref 70–99)
Potassium: 4 mEq/L (ref 3.5–5.1)
Sodium: 143 mEq/L (ref 135–145)

## 2011-11-23 LAB — CBC
HCT: 33.5 % — ABNORMAL LOW (ref 36.0–46.0)
Hemoglobin: 11 g/dL — ABNORMAL LOW (ref 12.0–15.0)
MCH: 31.3 pg (ref 26.0–34.0)
MCHC: 32.8 g/dL (ref 30.0–36.0)
MCV: 95.2 fL (ref 78.0–100.0)
Platelets: 122 10*3/uL — ABNORMAL LOW (ref 150–400)
RBC: 3.52 MIL/uL — ABNORMAL LOW (ref 3.87–5.11)
RDW: 13.1 % (ref 11.5–15.5)
WBC: 5.1 10*3/uL (ref 4.0–10.5)

## 2011-11-23 MED ORDER — SODIUM CHLORIDE 0.9 % IV SOLN
INTRAVENOUS | Status: DC
  Administered 2011-11-23 – 2011-11-24 (×3): via INTRAVENOUS

## 2011-11-23 MED ORDER — LORATADINE 10 MG PO TABS
10.0000 mg | ORAL_TABLET | Freq: Every day | ORAL | Status: DC
  Administered 2011-11-23 – 2011-11-25 (×3): 10 mg via ORAL
  Filled 2011-11-23 (×3): qty 1

## 2011-11-23 MED ORDER — DIPHENHYDRAMINE HCL 25 MG PO CAPS: 25.0000 mg | ORAL_CAPSULE | Freq: Four times a day (QID) | ORAL | Status: DC | PRN

## 2011-11-23 NOTE — Progress Notes (Signed)
I agree with the assessment completed by the student nurse, Denny Peon.--- Dorthey Sawyer, RN,BSN

## 2011-11-23 NOTE — Progress Notes (Signed)
PROGRESS NOTE  [name] [id number] DOB: [birth year] DOA: 11/21/2011 PCP: Thressa Sheller, MD, MD  Brief narrative: 66 year old present with upper primary tract infection type symptoms. Complained of vomiting, diarrhea. 6 contact positive for flu.  Past medical history: Asthma, hypertension, Graves' disease, fibromyalgia, depression, COPD, obstructive sleep apnea, GERD, angina?, Gastrectomy, carotid endarterectomy  Consultants:  None  Procedures:  None  Interim History: Chart reviewed.  Subjective: Feels much better today.  Objective: Filed Vitals:   11/22/11 2135 11/23/11 0107 11/23/11 0631 11/23/11 0916  BP:  101/47 93/55   Pulse:   67   Temp:   98.3 F (36.8 C)   TempSrc:   Oral   Resp:   18   Height:      Weight:      SpO2: 94%  97% 97%    Intake/Output Summary (Last 24 hours) at 11/23/11 0957 Last data filed at 11/23/11 0600  Gross per 24 hour  Intake 2232.08 ml  Output   1825 ml  Net 407.08 ml    Exam:  General: Appears calm and comfortable. Cardiovascular: Regular rate and rhythm. No murmur, rub or gallop. No lower extremity edema. Respiratory: Clear to auscultation bilaterally. No wheezes, rales or rhonchi. Normal respiratory effort.  Data Reviewed: Basic Metabolic Panel:  Lab 123XX123 0505 11/22/11 0455 11/21/11 1250  NA 143 141 132*  K 4.0 3.6 --  CL 111 107 93*  CO2 22 21 25   GLUCOSE 86 242* 84  BUN 26* 39* 37*  CREATININE 1.35* 1.78* 2.14*  CALCIUM 8.0* 7.8* 8.6  MG -- -- --  PHOS -- -- --   Liver Function Tests:  Lab 11/22/11 0455 11/21/11 1250  AST 101* 186*  ALT 254* 370*  ALKPHOS 197* 258*  BILITOT 0.2* 0.4  PROT 5.8* 7.1  ALBUMIN 2.7* 3.6   CBC:  Lab 11/23/11 0505 11/22/11 0455 11/21/11 1250  WBC 5.1 2.9* 2.7*  NEUTROABS -- -- 1.8  HGB 11.0* 12.1 13.7  HCT 33.5* 35.6* 39.9  MCV 95.2 91.5 92.1  PLT 122* 130* 134*   CBG:  Lab 11/22/11 0333  GLUCAP 293*    Studies: Dg Chest 2 View  11/22/2011   *RADIOLOGY REPORT*  Clinical Data: History of cough and congestion.  Fevers, chills and body aches.  Possible pneumonia.  CHEST - 2 VIEW  Comparison: Multiple priors, most recently 11/21/2011.  Findings: Lung volumes are normal.  There are bibasilar opacities that are predominately linear appearance, favored to represent areas of atelectasis.  Strictly speaking, early areas of airspace consolidation from infection or aspiration would be difficult to exclude.  Minimal blunting of the left costophrenic sulcus may suggest a trace left effusion.  Pulmonary vasculature is normal. Cardiomediastinal silhouette is within normal limits.  Surgical clips near the GE junction and in the right upper quadrant of the abdomen.  IMPRESSION:  1.  Bibasilar opacities favored to represent areas of atelectasis. Strictly speaking, areas of early airspace consolidation from pneumonia or aspiration would be difficult to exclude. 2.  Possible small left pleural effusion.  Original Report Authenticated By: Etheleen Mayhew, M.D.   Dg Chest 2 View  11/21/2011  *RADIOLOGY REPORT*  Clinical Data: Cough, congestion, fever  CHEST - 2 VIEW  Comparison: 10/01/2011  Findings: Chronic interstitial markings/emphysematous changes. Left basilar scarring. No pleural effusion or pneumothorax.  Cardiomediastinal silhouette is within normal limits.  Mild degenerative changes of the visualized thoracolumbar spine.  Surgical clips at the GE junction.  IMPRESSION: No evidence of acute cardiopulmonary disease.  Chronic interstitial markings/emphysematous changes with left basilar scarring.  Original Report Authenticated By: Julian Hy, M.D.    Scheduled Meds:   . albuterol  2.5 mg Nebulization QID  . amLODipine  5 mg Oral Daily  . aspirin  81 mg Oral Daily  . calcium acetate  667 mg Oral TID WC  . dicyclomine  20 mg Oral Q6H  . DULoxetine  60 mg Oral Daily  . fentaNYL  75 mcg Transdermal QODAY  . fluticasone  2 spray Each Nare QHS  .  gatifloxacin  1 drop Both Eyes QID  . guaiFENesin  600 mg Oral BID  . heparin  5,000 Units Subcutaneous Q8H  . metoCLOPramide  5 mg Oral TID AC & HS  . mulitivitamin with minerals  1 tablet Oral Daily  . oseltamivir  75 mg Oral BID  . pantoprazole  40 mg Oral BID  . predniSONE  5 mg Oral QODAY  . simvastatin  10 mg Oral QHS  . Vitamin D (Ergocalciferol)  50,000 Units Oral Q7 days  . DISCONTD: sodium chloride   Intravenous STAT  . DISCONTD: oseltamivir  75 mg Oral Daily   Continuous Infusions:   . sodium chloride 75 mL/hr at 11/22/11 1703  . DISCONTD: sodium chloride 125 mL/hr at 11/21/11 1645     Assessment/Plan: 1. Influenza A.: Improving. Continue Tamiflu. 2. Acute renal failure: Rapidly resolving. 3. Pancytopenia: Probably secondary to acute illness. Continue to follow. Repeat CBC in the morning. 4. Transaminitis: Etiology and significance unclear. Trending downwards. Hold statin for now. Appears to be symptomatic. 5. Asthma: Stable. Continue current prednisone. Nebulizer treatments. Oxygen. 6. History of Graves' disease. 7. Chronic back pain: Stable. 8. Urinary retention: Be secondary to narcotics. Continue to follow. Remove Foley tomorrow.  Code Status: Full code  Disposition Plan: Home when improved.   Murray Hodgkins, MD  [facility] Pager [phone] 11/23/2011, 9:57 AM    LOS: 2 days

## 2011-11-24 LAB — CBC
HCT: 32.6 % — ABNORMAL LOW (ref 36.0–46.0)
Hemoglobin: 10.7 g/dL — ABNORMAL LOW (ref 12.0–15.0)
MCH: 31 pg (ref 26.0–34.0)
MCHC: 32.8 g/dL (ref 30.0–36.0)
MCV: 94.5 fL (ref 78.0–100.0)
Platelets: 123 10*3/uL — ABNORMAL LOW (ref 150–400)
RBC: 3.45 MIL/uL — ABNORMAL LOW (ref 3.87–5.11)
RDW: 13 % (ref 11.5–15.5)
WBC: 4.9 10*3/uL (ref 4.0–10.5)

## 2011-11-24 LAB — COMPREHENSIVE METABOLIC PANEL WITH GFR
ALT: 130 U/L — ABNORMAL HIGH (ref 0–35)
AST: 39 U/L — ABNORMAL HIGH (ref 0–37)
Albumin: 2.5 g/dL — ABNORMAL LOW (ref 3.5–5.2)
Alkaline Phosphatase: 159 U/L — ABNORMAL HIGH (ref 39–117)
BUN: 21 mg/dL (ref 6–23)
CO2: 26 meq/L (ref 19–32)
Calcium: 8.1 mg/dL — ABNORMAL LOW (ref 8.4–10.5)
Chloride: 109 meq/L (ref 96–112)
Creatinine, Ser: 1.24 mg/dL — ABNORMAL HIGH (ref 0.50–1.10)
GFR calc Af Amer: 51 mL/min — ABNORMAL LOW
GFR calc non Af Amer: 44 mL/min — ABNORMAL LOW
Glucose, Bld: 81 mg/dL (ref 70–99)
Potassium: 3.8 meq/L (ref 3.5–5.1)
Sodium: 142 meq/L (ref 135–145)
Total Bilirubin: 0.2 mg/dL — ABNORMAL LOW (ref 0.3–1.2)
Total Protein: 5.1 g/dL — ABNORMAL LOW (ref 6.0–8.3)

## 2011-11-24 MED ORDER — ALBUTEROL SULFATE (5 MG/ML) 0.5% IN NEBU
2.5000 mg | INHALATION_SOLUTION | Freq: Three times a day (TID) | RESPIRATORY_TRACT | Status: DC
  Administered 2011-11-24 (×3): 2.5 mg via RESPIRATORY_TRACT
  Filled 2011-11-24 (×3): qty 0.5

## 2011-11-24 NOTE — Progress Notes (Signed)
PROGRESS NOTE  [name] [id number] DOB: 1946-09-14 DOA: 11/21/2011 PCP: Thressa Sheller, MD, MD  Brief narrative: 66 year old present with upper primary tract infection type symptoms. Complained of vomiting, diarrhea. Sick contact positive for flu.  Was diagnosed with influenza A and placed on Tamiflu. She is slowly improving. Plan is to wean oxygen. Her acute renal failure has nearly resolved. Transaminitis of unclear etiology is slowly improving. Anticipate discharge home when improved.  Past medical history: Asthma, hypertension, Graves' disease, fibromyalgia, depression, COPD, obstructive sleep apnea, GERD, angina?, Gastrectomy, carotid endarterectomy  Consultants:  None  Procedures:  None  Interim History: No interval documentation.  Subjective: Feels better.  Objective: Filed Vitals:   11/23/11 2115 11/23/11 2151 11/24/11 0619 11/24/11 0903  BP: 112/69  112/64   Pulse: 82  73   Temp: 98.3 F (36.8 C)  98.4 F (36.9 C)   TempSrc: Oral  Oral   Resp: 16  18   Height:      Weight:      SpO2: 95% 91% 93% 91%    Intake/Output Summary (Last 24 hours) at 11/24/11 1156 Last data filed at 11/24/11 0500  Gross per 24 hour  Intake   2400 ml  Output   2051 ml  Net    349 ml    Exam:  General: Appears calm and comfortable. Cardiovascular: Regular rate and rhythm. No murmur, rub or gallop. No lower extremity edema. Respiratory: Clear to auscultation bilaterally. No wheezes, rales or rhonchi. Normal respiratory effort.  Data Reviewed: Basic Metabolic Panel:  Lab [id number] 0604 11/23/11 0505 11/22/11 0455 11/21/11 1250  NA 142 143 141 132*  K 3.8 4.0 -- --  CL 109 111 107 93*  CO2 26 22 21 25   GLUCOSE 81 86 242* 84  BUN 21 26* 39* 37*  CREATININE 1.24* 1.35* 1.78* 2.14*  CALCIUM 8.1* 8.0* 7.8* 8.6  MG -- -- -- --  PHOS -- -- -- --   Liver Function Tests:  Lab 11/24/11 0604 11/22/11 0455 11/21/11 1250  AST 39* 101* 186*  ALT 130* 254* 370*    ALKPHOS 159* 197* 258*  BILITOT 0.2* 0.2* 0.4  PROT 5.1* 5.8* 7.1  ALBUMIN 2.5* 2.7* 3.6   CBC:  Lab 11/24/11 0604 11/23/11 0505 11/22/11 0455 11/21/11 1250  WBC 4.9 5.1 2.9* 2.7*  NEUTROABS -- -- -- 1.8  HGB 10.7* 11.0* 12.1 13.7  HCT 32.6* 33.5* 35.6* 39.9  MCV 94.5 95.2 91.5 92.1  PLT 123* 122* 130* 134*   CBG:  Lab 11/22/11 0333  GLUCAP 293*    Studies: Dg Chest 2 View  11/22/2011  *RADIOLOGY REPORT*  Clinical Data: History of cough and congestion.  Fevers, chills and body aches.  Possible pneumonia.  CHEST - 2 VIEW  Comparison: Multiple priors, most recently 11/21/2011.  Findings: Lung volumes are normal.  There are bibasilar opacities that are predominately linear appearance, favored to represent areas of atelectasis.  Strictly speaking, early areas of airspace consolidation from infection or aspiration would be difficult to exclude.  Minimal blunting of the left costophrenic sulcus may suggest a trace left effusion.  Pulmonary vasculature is normal. Cardiomediastinal silhouette is within normal limits.  Surgical clips near the GE junction and in the right upper quadrant of the abdomen.  IMPRESSION:  1.  Bibasilar opacities favored to represent areas of atelectasis. Strictly speaking, areas of early airspace consolidation from pneumonia or aspiration would be difficult to exclude. 2.  Possible small left pleural effusion.  Original Report Authenticated By:  Etheleen Mayhew, M.D.   Dg Chest 2 View  11/21/2011  *RADIOLOGY REPORT*  Clinical Data: Cough, congestion, fever  CHEST - 2 VIEW  Comparison: 10/01/2011  Findings: Chronic interstitial markings/emphysematous changes. Left basilar scarring. No pleural effusion or pneumothorax.  Cardiomediastinal silhouette is within normal limits.  Mild degenerative changes of the visualized thoracolumbar spine.  Surgical clips at the GE junction.  IMPRESSION: No evidence of acute cardiopulmonary disease.  Chronic interstitial  markings/emphysematous changes with left basilar scarring.  Original Report Authenticated By: Julian Hy, M.D.    Scheduled Meds:    . albuterol  2.5 mg Nebulization TID  . amLODipine  5 mg Oral Daily  . aspirin  81 mg Oral Daily  . calcium acetate  667 mg Oral TID WC  . dicyclomine  20 mg Oral Q6H  . DULoxetine  60 mg Oral Daily  . fentaNYL  75 mcg Transdermal QODAY  . fluticasone  2 spray Each Nare QHS  . gatifloxacin  1 drop Both Eyes QID  . guaiFENesin  600 mg Oral BID  . heparin  5,000 Units Subcutaneous Q8H  . loratadine  10 mg Oral Daily  . metoCLOPramide  5 mg Oral TID AC & HS  . mulitivitamin with minerals  1 tablet Oral Daily  . oseltamivir  75 mg Oral BID  . pantoprazole  40 mg Oral BID  . predniSONE  5 mg Oral QODAY  . Vitamin D (Ergocalciferol)  50,000 Units Oral Q7 days  . DISCONTD: albuterol  2.5 mg Nebulization QID  . DISCONTD: simvastatin  10 mg Oral QHS   Continuous Infusions:    . sodium chloride 75 mL/hr at 11/24/11 0930     Assessment/Plan: 1. Influenza A.: Improving. Continue Tamiflu. 2. Acute renal failure: Rapidly resolving. Discontinue IV fluids. 3. Pancytopenia: Leukopenia resolved. Probably secondary to acute illness. Continue to follow. 4. Transaminitis: Etiology and significance unclear. Probably related to acute illness. Trending downwards. Hold statin for now. Appears to be symptomatic. 5. Asthma: Stable. Continue chronic prednisone. Nebulizer treatments. Oxygen. 6. History of Graves' disease. 7. Chronic back pain: Stable. 8. Urinary retention: Thought to be secondary to narcotics. Continue to follow. Remove Foley tomorrow.   Code Status: Full code  Disposition Plan: Home when improved.   Murray Hodgkins, MD  [facility] Pager [phone] 11/24/2011, 11:56 AM    LOS: 3 days

## 2011-11-25 LAB — COMPREHENSIVE METABOLIC PANEL
ALT: 115 U/L — ABNORMAL HIGH (ref 0–35)
AST: 39 U/L — ABNORMAL HIGH (ref 0–37)
Albumin: 3 g/dL — ABNORMAL LOW (ref 3.5–5.2)
Alkaline Phosphatase: 185 U/L — ABNORMAL HIGH (ref 39–117)
BUN: 20 mg/dL (ref 6–23)
CO2: 28 mEq/L (ref 19–32)
Calcium: 8.9 mg/dL (ref 8.4–10.5)
Chloride: 104 mEq/L (ref 96–112)
Creatinine, Ser: 1.41 mg/dL — ABNORMAL HIGH (ref 0.50–1.10)
GFR calc Af Amer: 44 mL/min — ABNORMAL LOW (ref >90)
GFR calc non Af Amer: 38 mL/min — ABNORMAL LOW (ref >90)
Glucose, Bld: 84 mg/dL (ref 70–99)
Potassium: 4.3 mEq/L (ref 3.5–5.1)
Sodium: 139 mEq/L (ref 135–145)
Total Bilirubin: 0.3 mg/dL (ref 0.3–1.2)
Total Protein: 6.2 g/dL (ref 6.0–8.3)

## 2011-11-25 LAB — CBC
HCT: 36.4 % (ref 36.0–46.0)
Hemoglobin: 12.1 g/dL (ref 12.0–15.0)
MCH: 31.1 pg (ref 26.0–34.0)
MCHC: 33.2 g/dL (ref 30.0–36.0)
MCV: 93.6 fL (ref 78.0–100.0)
Platelets: 178 10*3/uL (ref 150–400)
RBC: 3.89 MIL/uL (ref 3.87–5.11)
RDW: 12.7 % (ref 11.5–15.5)
WBC: 6.6 10*3/uL (ref 4.0–10.5)

## 2011-11-25 MED ORDER — OSELTAMIVIR PHOSPHATE 75 MG PO CAPS: 75.0000 mg | ORAL_CAPSULE | Freq: Two times a day (BID) | ORAL | Status: AC

## 2011-11-25 NOTE — Discharge Summary (Signed)
Physician Discharge Summary  Patient ID: [name] MRN: [id number] DOB/AGE: 1946/09/20 66 y.o.  Admit date: 11/21/2011 Discharge date: 11/25/2011  Admission Diagnoses: malaise and fevers  Discharge Diagnoses:  Principal Problem:  *Viral bronchitis-possible h. infl vs Norovirus   Discharged Condition: good Patient is a 66 yr old CF with a h/o Tob abuse, hypertension fibromyalgia thyroid disease asthma Vs. COPD and recent L heart cath 09/2011 showing showed nonobstructive ASCAD of the LA, Left circumflex and diagonals  states that she has been Sick for couple of weeks.  Was sick one time and had Vomiting and diarrhoea.  The week before that just had the vomiting.   Cannot get around in her house..  Was around her Aide who had the Flu.  States that she had the Flu shot in Sept/Oct.    Her friend finally talked her in to coming to the emergancy room-as she was"not getting any better" just worse.    Right now feels shaky and hurting.  Does take medicaitons for her Fibromyalgia-takes Fentanly patches-and also has pain meds OxyIR as well.  Has a cough and has had a fever as well-started Saturday night.  Measured at 101.5.    Hospital Course 1. Malaise-FLu PCR +.  Was transietly on Avelox which was d/c.  To complete 6 more days of Tamiflu and urged to follow-up with primary physician in the near future for further follow-up  2. Acute renal failure:Initally appeared volume depleted-Kept on IVF.  Creatinine trendd downwards during hospitalizatio, and might have some element of CKD, as evidenced from priopr notes and the use of Biarbonate around the time of her Cardiac catherterization 3. Pancytopenia: Probably secondary to acute illness.  This remained in the 120-130 range during hospitalization and would reassess as an out-patient  She had no significant bleeding or other ssue. 4. Transaminitis: Etiology and significance unclear. Trending downwards. Hold statin for now.  Woud re-address with LFT's as  an out-patient and probably not decrease/hold statGiven recent h/o CP 5. Asthma: Stable-was on increased dosage of Prednisone during hospitalization, which will continue.  She will aslo continue her regular scheduled inhalers and had no oxygen need prior to d/c home 6. History of Graves' disease.  7. Chronic back pain: Stable.  8. Urinary retention: likely secondary to narcotics.  Will likely need follow-up as an out-patient   Consults: None  Significant Diagnostic Studies:  CXR's 2nd 1/24 1.  Bibasilar opacities favored to represent areas of atelectasis. Strictly speaking, areas of early airspace consolidation from pneumonia or aspiration would be difficult to exclude. 2.  Possible small left pleural effusion. IMPRESSION: No evidence of acute cardiopulmonary disease.   CXR 1/23 Chronic interstitial markings/emphysematous changes with left basilar scarring.   Treatments: IV hydration, antibiotics: Tamifluids: prednisone and respiratory therapy: albuterol/atropine nebulizer  Discharge Exam: Blood pressure 111/49, pulse 77, temperature 98.1 F (36.7 C), temperature source Oral, resp. rate 18, height 5\' 6"  (1.676 m), weight 56.8 kg (125 lb 3.5 oz), SpO2 92.00%. HEENT-exopthalmos.  No pallor, no ict CHEST:clinically clear.  No added sound CARDS: S1 S@ no M/R/G ABD: Soft NT/ND EXT: soft  Disposition: Home or Self Care  Follow-up Appointments: Discharge Orders    Future Appointments: Provider: Department: Dept Phone: Center:   03/12/2012 1:30 PM [facility] [id number] [facility]   03/12/2012 2:30 PM Angelia Mould, MD [facility] [phone] [facility]     Future Orders Please Complete By Expires   Diet - low sodium heart healthy      Increase  activity slowly      Call MD for:      Call MD for:  temperature >100.4      Call MD for:  severe uncontrolled pain      Call MD for:  persistant nausea and vomiting      Call MD for:  persistant dizziness or  light-headedness         Discharge Medications: Medication List  As of 11/25/2011  8:01 AM   TAKE these medications         albuterol-ipratropium 18-103 MCG/ACT inhaler   Commonly known as: COMBIVENT   Inhale 2 puffs into the lungs every 6 (six) hours as needed. For shortness of breath      amLODipine 5 MG tablet   Commonly known as: NORVASC   Take 5 mg by mouth daily.      aspirin 81 MG tablet   Take 81 mg by mouth daily.      calcium acetate 667 MG capsule   Commonly known as: PHOSLO   Take 667 mg by mouth 3 (three) times daily with meals.      clonazePAM 1 MG tablet   Commonly known as: KLONOPIN   Take 1 mg by mouth 2 (two) times daily as needed. For anxiety      CORICIDIN HBP COLD/FLU PO   Take 1 tablet by mouth 2 (two) times daily.      dicyclomine 20 MG tablet   Commonly known as: BENTYL   Take 20 mg by mouth every 6 (six) hours.      DULoxetine 60 MG capsule   Commonly known as: CYMBALTA   Take 60 mg by mouth daily.      fentaNYL 75 MCG/HR   Commonly known as: DURAGESIC - dosed mcg/hr   Place 1 patch onto the skin every other day.      fluticasone 50 MCG/ACT nasal spray   Commonly known as: FLONASE   Place 2 sprays into the nose at bedtime.      guaiFENesin 600 MG 12 hr tablet   Commonly known as: MUCINEX   Take 600 mg by mouth 2 (two) times daily.      hyoscyamine 0.125 MG SL tablet   Commonly known as: LEVSIN SL   Place 0.125 mg under the tongue every 4 (four) hours as needed. Before meals as needed      metoCLOPramide 5 MG tablet   Commonly known as: REGLAN   Take 5 mg by mouth 4 (four) times daily. Take 1 tablet 30 minutes before meals and at bedtime      moxifloxacin 0.5 % ophthalmic solution   Commonly known as: VIGAMOX   Place 1 drop into both eyes 2 (two) times daily.      multivitamin-prenatal 27-0.8 MG Tabs   Take 1 tablet by mouth daily.      nitroGLYCERIN 0.4 MG SL tablet   Commonly known as: NITROSTAT   Place 0.4 mg under the  tongue every 5 (five) minutes as needed. For chest pain      oseltamivir 75 MG capsule   Commonly known as: TAMIFLU   Take 1 capsule (75 mg total) by mouth 2 (two) times daily.      oxyCODONE 5 MG immediate release tablet   Commonly known as: Oxy IR/ROXICODONE   Take 5 mg by mouth every 4 (four) hours as needed. For pain      pantoprazole 40 MG tablet   Commonly known as: PROTONIX   Take  40 mg by mouth 2 (two) times daily.      predniSONE 5 MG tablet   Commonly known as: DELTASONE   Take 5 mg by mouth every other day.      simvastatin 10 MG tablet   Commonly known as: ZOCOR   Take 10 mg by mouth at bedtime.      Vitamin D (Ergocalciferol) 50000 UNITS Caps   Commonly known as: DRISDOL   Take 50,000 Units by mouth every 7 (seven) days. Every saturday            Signed: Astoria Condon,JAI 11/25/2011, 7:51 AM

## 2012-03-11 ENCOUNTER — Encounter: Payer: Self-pay | Admitting: Vascular Surgery

## 2012-03-12 ENCOUNTER — Encounter: Payer: Self-pay | Admitting: Vascular Surgery

## 2012-03-12 ENCOUNTER — Ambulatory Visit (INDEPENDENT_AMBULATORY_CARE_PROVIDER_SITE_OTHER): Payer: Medicare Other | Admitting: Vascular Surgery

## 2012-03-12 VITALS — BP 144/93 | HR 74 | Temp 98.5°F | Ht 66.0 in | Wt 129.0 lb

## 2012-03-12 DIAGNOSIS — I6529 Occlusion and stenosis of unspecified carotid artery: Secondary | ICD-10-CM | POA: Insufficient documentation

## 2012-03-12 DIAGNOSIS — Z48812 Encounter for surgical aftercare following surgery on the circulatory system: Secondary | ICD-10-CM

## 2012-03-12 NOTE — Progress Notes (Signed)
[facility]  Patient name: [name] MRN: [id number] DOB: Aug 11, 1946 Sex: female  REASON FOR VISIT: follow up of right carotid stenosis  HPI: [name] is a 66 y.o. female who underwent a left carotid endarterectomy in September of 2011. She did well from that standpoint have been following her with a 60-79% right carotid stenosis. Since I last saw her a year ago she denies any history of stroke, TIAs, expressive or receptive aphasia, or amaurosis fugax.  She continues to smoke 2 packs per day of cigarettes.  REVIEW OF SYSTEMS: Valu.Nieves ] denotes positive finding; [  ] denotes negative finding  CARDIOVASCULAR:  [ ]  chest pain   Valu.Nieves ] dyspnea on exertion    CONSTITUTIONAL:  [ ]  fever   [ ]  chills  PHYSICAL EXAM: Filed Vitals:   03/12/12 1422 03/12/12 1427  BP: 134/74 144/93  Pulse: 73 74  Temp: 98.5 F (36.9 C)   TempSrc: Oral   Height: 5\' 6"  (1.676 m)   Weight: 129 lb (58.514 kg)    Body mass index is 20.82 kg/(m^2). GENERAL: The patient is a well-nourished female, in no acute distress. The vital signs are documented above. CARDIOVASCULAR: There is a regular rate and rhythm. She has a right carotid bruit.  PULMONARY: There is good air exchange bilaterally without wheezing or rales. NEURO: Good strength in the upper chimneys and lower chimneys bilaterally.  I have independently interpreted her carotid duplex scan which shows a 60-79% right carotid stenosis and a mild 40-59% recurrent left carotid stenosis which appears to be secondary to intimal hyperplasia.  MEDICAL ISSUES: Occlusion and stenosis of carotid artery without mention of cerebral infarction Her right 60-79% right carotid stenosis is stable. Her velocities have not changed significantly compared to her most recent study. There is a mild recurrent stenosis on the left in the 40-59% range. She understands we would not consider carotid endarterectomy on the right was the stenosis progressed  to greater than 80% or she developed new neurologic symptoms. I have ordered a fall carotid duplex scan in 6 months. I'll plan on seeing her back in one year and last is any significant change in her duplex scan in 6 months. She knows to remain on her aspirin. In addition we have had a long discussion of the importance of tobacco cessation and we have referred her to comes tobacco cessation clinic.    [place] [facility] [place] Beeper: [phone]

## 2012-03-12 NOTE — Assessment & Plan Note (Signed)
Her right 60-79% right carotid stenosis is stable. Her velocities have not changed significantly compared to her most recent study. There is a mild recurrent stenosis on the left in the 40-59% range. She understands we would not consider carotid endarterectomy on the right was the stenosis progressed to greater than 80% or she developed new neurologic symptoms. I have ordered a fall carotid duplex scan in 6 months. I'll plan on seeing her back in one year and last is any significant change in her duplex scan in 6 months. She knows to remain on her aspirin. In addition we have had a long discussion of the importance of tobacco cessation and we have referred her to comes tobacco cessation clinic.

## 2012-03-20 NOTE — Procedures (Unsigned)
CAROTID DUPLEX EXAM  INDICATION:  Carotid stenosis.  HISTORY: Diabetes:  Diet controlled. Cardiac:  CAD. Hypertension:  Yes. Smoking:  Currently. Previous Surgery:  Left carotid endarterectomy on 07/18/2010. CV History:  Currently asymptomatic. Amaurosis Fugax No, Paresthesias No, Hemiparesis No.                                      RIGHT             LEFT Brachial systolic pressure:         134               130 Brachial Doppler waveforms:         WNL               WNL Vertebral direction of flow:        Antegrade         Antegrade DUPLEX VELOCITIES (cm/sec) CCA peak systolic                   77                [id number] ECA peak systolic                   241               [id number] ICA peak systolic                   249               87 - P/145 - P/M ICA end diastolic                   67                27 - P/50 - P/M PLAQUE MORPHOLOGY:                  Heterogenous      Heterogenous PLAQUE AMOUNT:                      Moderate-to-severe                  Moderate PLAQUE LOCATION:                    CCA/ICA/ECA       ICA  IMPRESSION: 1. Right internal carotid artery stenosis present in the 60% to 79%     range. 2. Right external carotid artery stenosis present. 3. Left external carotid artery appears patent. 4. Left internal carotid artery is patent with a history of     endarterectomy, mild-to-moderate hyperplasia present at     endarterectomy site.  Elevated velocities present distal to     endarterectomy site, suggestive of 40% to 59% stenosis. 5. Bilateral vertebral arteries are patent and antegrade. 6. Right side is essentially unchanged since previous study on     09/07/2011 and increase in the disease process on the left since     the previous study on 09/07/2011.  ___________________________________________ Judeth Cornfield. Scot Dock, M.D.  SH/MEDQ  D:  03/12/2012  T:  03/12/2012  Job:  [id number]

## 2012-11-12 ENCOUNTER — Other Ambulatory Visit: Payer: Self-pay | Admitting: *Deleted

## 2013-03-04 ENCOUNTER — Ambulatory Visit: Payer: Self-pay | Admitting: Neurology

## 2013-03-12 ENCOUNTER — Ambulatory Visit: Payer: Medicare Other | Admitting: Neurosurgery

## 2013-03-12 ENCOUNTER — Other Ambulatory Visit: Payer: Medicare Other

## 2013-03-13 ENCOUNTER — Encounter: Payer: Self-pay | Admitting: Surgery

## 2013-03-16 ENCOUNTER — Other Ambulatory Visit: Payer: Medicare Other

## 2013-03-16 ENCOUNTER — Ambulatory Visit: Payer: Medicare Other | Admitting: Neurosurgery

## 2013-04-01 ENCOUNTER — Ambulatory Visit (INDEPENDENT_AMBULATORY_CARE_PROVIDER_SITE_OTHER): Payer: Medicare Other | Admitting: *Deleted

## 2013-04-01 ENCOUNTER — Other Ambulatory Visit: Payer: Medicare Other

## 2013-04-01 VITALS — BP 120/70 | HR 72 | Ht 66.25 in | Wt 118.0 lb

## 2013-04-01 DIAGNOSIS — Z48812 Encounter for surgical aftercare following surgery on the circulatory system: Secondary | ICD-10-CM

## 2013-04-01 DIAGNOSIS — I6529 Occlusion and stenosis of unspecified carotid artery: Secondary | ICD-10-CM

## 2013-04-03 ENCOUNTER — Other Ambulatory Visit: Payer: Self-pay

## 2013-04-03 DIAGNOSIS — Z48812 Encounter for surgical aftercare following surgery on the circulatory system: Secondary | ICD-10-CM

## 2013-04-07 ENCOUNTER — Encounter: Payer: Self-pay | Admitting: Vascular Surgery

## 2013-04-23 ENCOUNTER — Ambulatory Visit (INDEPENDENT_AMBULATORY_CARE_PROVIDER_SITE_OTHER): Payer: Medicare Other | Admitting: Neurology

## 2013-04-23 ENCOUNTER — Encounter: Payer: Self-pay | Admitting: Neurology

## 2013-04-23 ENCOUNTER — Encounter: Payer: Self-pay | Admitting: *Deleted

## 2013-04-23 VITALS — BP 137/73 | HR 87 | Temp 98.7°F | Ht 66.25 in | Wt 130.0 lb

## 2013-04-23 DIAGNOSIS — G250 Essential tremor: Secondary | ICD-10-CM

## 2013-04-23 DIAGNOSIS — E1149 Type 2 diabetes mellitus with other diabetic neurological complication: Secondary | ICD-10-CM

## 2013-04-23 DIAGNOSIS — J441 Chronic obstructive pulmonary disease with (acute) exacerbation: Secondary | ICD-10-CM

## 2013-04-23 DIAGNOSIS — M48061 Spinal stenosis, lumbar region without neurogenic claudication: Secondary | ICD-10-CM

## 2013-04-23 DIAGNOSIS — E119 Type 2 diabetes mellitus without complications: Secondary | ICD-10-CM | POA: Insufficient documentation

## 2013-04-23 DIAGNOSIS — F17200 Nicotine dependence, unspecified, uncomplicated: Secondary | ICD-10-CM

## 2013-04-23 DIAGNOSIS — G251 Drug-induced tremor: Secondary | ICD-10-CM | POA: Insufficient documentation

## 2013-04-23 HISTORY — DX: Nicotine dependence, unspecified, uncomplicated: F17.200

## 2013-04-23 HISTORY — DX: Spinal stenosis, lumbar region without neurogenic claudication: M48.061

## 2013-04-23 NOTE — Progress Notes (Signed)
[facility]  Provider:  Dr Marysa Wessner Referring Provider: Thressa Sheller, MD Primary Care Physician:  Thressa Sheller, MD    HPI:  [name] is a 67 y.o. female here as a referral from Dr. Noah Delaine for evaluation of a right dominant tremor.  A she had last been seen in 2011 for neuropathy workup,  but today's referral is mainly for tremor, truncal tremor , vocal tremor and trembling jaw.  She walks  Still with a cane that she had 4 years ago , beside  The order for a PT evaluation for a walker initiated 2011 - she didn't follow up.  It  helped her with ambulation when she leans onto the shopping cart in the supermarket, reports she also has very diffuse myalgia, soreness , hip and knee and back pain.   She had tried restless leg medication in 2010 , begun on Mirapex but this made her too drowsy. No other RLS medication since.   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 The  patient had an a gastrectomy at age 20 and small intestine  removed needed Reglan to have normal peristaltic a somewhat normal peristaltic.  After the patient  had last been seen the patient on 07/18/2010, she had a left-sided corded endarterectomy.   She continues to smoke to this day,   This day the patient relies on the promethazine for peristaltic support , and continues to tremble.   She also has a history of diabetes and diabetic peripheral neuropathy which canceled it contributes to restless leg syndrome tremor and gait disorder.  She has degenerative.joint disease was chronic back pain and  spinal stenosis at the lumbar spine.    Review of Systems: Out of a complete 14 system review, the patient complains of only the following symptoms, and all other reviewed systems are negative.  anorexia, tremor,  Gait disorder, lumbar stenosis pain.   History   Social History  . Marital Status: Widowed    Spouse Name: N/A    Number of Children: 1   . Years of Education: N/A   Occupational History  . Not on file.   Social History Main Topics  . Smoking status: Current Every Day Smoker -- 2.00 packs/day for 50 years    Types: Cigarettes  . Smokeless tobacco: Never Used     Comment: cessation info given and reviewed  . Alcohol Use: No  . Drug Use: No  . Sexually Active: No   Other Topics Concern  . Not on file   Social History Narrative  . No narrative on file    Family History  Problem Relation Age of Onset  . Diabetes Mother   . Hyperlipidemia Mother   . Heart attack Mother   . Other Mother     varicose veins,respiratory,stroke  . Heart disease Mother     before age 51  . Diabetes Father   . Heart disease Father     before age 27  . Hyperlipidemia Father   . Heart attack Father   . Other Father     varicose veins  . Diabetes Sister   . Heart disease Sister     before age 85  . Hyperlipidemia Sister   . Heart attack Sister   . Other Sister     varicose veins    Past Medical History  Diagnosis Date  . Asthma   . HTN (hypertension)   . Grave's disease   . Fibromyalgia   .  Thrombophlebitis   . Depression   . Angina   . Shortness of breath   . COPD (chronic obstructive pulmonary disease)   . Sleep apnea   . Blood dyscrasia     hx of thrombphlebitis  . Recurrent upper respiratory infection (URI)   . GERD (gastroesophageal reflux disease)   . Headache(784.0)   . Arthritis   . CAD (coronary artery disease)   . Chronic kidney disease   . Peripheral vascular disease   . Diabetes mellitus   . Ulcer   . Diarrhea     chronic   . Thyroid disorder   . Anxiety and depression   . Chronic pain     leg and feet  . DDD (degenerative disc disease)   . Rhinitis   . OP (osteoporosis)   . Vitamin B 12 deficiency   . PUD (peptic ulcer disease)   . DDD (degenerative disc disease)   . Spinal stenosis     Past Surgical History  Procedure Laterality Date  . Gastrectomy      age 34  . Carotid  endarterectomy    . Cholecystectomy  1999    Current Outpatient Prescriptions  Medication Sig Dispense Refill  . albuterol-ipratropium (COMBIVENT) 18-103 MCG/ACT inhaler Inhale 2 puffs into the lungs every 6 (six) hours as needed. For shortness of breath       . amLODipine (NORVASC) 5 MG tablet Take 5 mg by mouth daily.        [name] aspirin 81 MG tablet Take 81 mg by mouth daily.        . calcium acetate (PHOSLO) 667 MG capsule Take 667 mg by mouth 3 (three) times daily with meals.       . Chlorpheniramine-Acetaminophen (CORICIDIN HBP COLD/FLU PO) Take 1 tablet by mouth 2 (two) times daily.      . clonazePAM (KLONOPIN) 1 MG tablet Take 1 mg by mouth 2 (two) times daily as needed. For anxiety      . dicyclomine (BENTYL) 20 MG tablet Take 20 mg by mouth every 6 (six) hours.       . DULoxetine (CYMBALTA) 60 MG capsule Take 60 mg by mouth daily.        . fentaNYL (DURAGESIC - DOSED MCG/HR) 75 MCG/HR Place 1 patch onto the skin every other day.       . fluticasone (FLONASE) 50 MCG/ACT nasal spray Place 2 sprays into the nose at bedtime.      [name] guaiFENesin (MUCINEX) 600 MG 12 hr tablet Take 600 mg by mouth 2 (two) times daily.      . hyoscyamine (LEVSIN SL) 0.125 MG SL tablet Place 0.125 mg under the tongue every 4 (four) hours as needed. Before meals as needed      . metoCLOPramide (REGLAN) 5 MG tablet Take 5 mg by mouth 4 (four) times daily. Take 1 tablet 30 minutes before meals and at bedtime       . moxifloxacin (VIGAMOX) 0.5 % ophthalmic solution Place 1 drop into both eyes 2 (two) times daily.        . nitroGLYCERIN (NITROSTAT) 0.4 MG SL tablet Place 0.4 mg under the tongue every 5 (five) minutes as needed. For chest pain       . oxyCODONE (OXY IR/ROXICODONE) 5 MG immediate release tablet Take 5 mg by mouth every 4 (four) hours as needed. For pain       . pantoprazole (PROTONIX) 40 MG tablet Take 40 mg by mouth 2 (two) times  daily.       . predniSONE (DELTASONE) 5 MG tablet Take 5 mg by mouth  every other day.        . Prenatal Vit-Fe Fumarate-FA (MULTIVITAMIN-PRENATAL) 27-0.8 MG TABS Take 1 tablet by mouth daily.        . simvastatin (ZOCOR) 10 MG tablet Take 10 mg by mouth at bedtime.        . Vitamin D, Ergocalciferol, (DRISDOL) 50000 UNITS CAPS Take 50,000 Units by mouth every 7 (seven) days. Every saturday       No current facility-administered medications for this visit.    Allergies as of 04/23/2013 - Review Complete 04/23/2013  Allergen Reaction Noted  . Clarithromycin Nausea Only 09/18/2011    Vitals: BP 137/73  Pulse 87  Temp(Src) 98.7 F (37.1 C) (Oral)  Ht 5' 6.25" (1.683 m)  Wt 130 lb (58.968 kg)  BMI 20.82 kg/m2 Last Weight:  Wt Readings from Last 1 Encounters:  04/23/13 130 lb (58.968 kg)   Last Height:   Ht Readings from Last 1 Encounters:  04/23/13 5' 6.25" (1.683 m)     Physical exam:  General: The patient is awake, alert and appears not in acute distress. The patient is well groomed. Head: Normocephalic, atraumatic. Neck is supple. Mallampati  1 , neck circumference: 12.25.  Cardiovascular:  Regular rate and rhythm, without  murmurs or carotid bruit, and without distended neck veins. Respiratory:  Skin:  Without evidence of edema, or rash Trunk: BMI is elevated and patient has normal posture.  Neurologic exam : The patient is awake and alert, oriented to place and time.  Memory subjective  described as impaired ,  There is a normal attention span & concentration ability.  Speech is fluent without  dysarthria,  But dysphonia .Mood and affect are appropriate.   There is not TMJ click, but dysphagia to solids , with delayed oropharyngeal phase.    Cranial nerves: Pupils are equal and reactive to light. Dilated due to narcotic? Funduscopic exam deferred, Eye protrusion , Graves disease.  Extraocular movements  in vertical and horizontal planes intact and without nystagmus. Visual fields by finger perimetry are intact. Hearing to finger rub  intact.  Facial sensation intact to fine touch. Facial motor strength is symmetric and tongue and uvula move midline.  Motor exam:  The patient does not have cogwheeling she has mild rigidity she does not have necessarily  resting tremor , more action tremor and slight dysphonia reported. Bilaterally equal grip strength. I see today nor right-hand-dominant for her tremor when be performed rapid alternating movements. Sensory:  Fine touch, pinprick and vibration were tested in all extremities. Lower extremities aren't numb from the mid cold level down she has very sensitive soles and Babinski maneuver testing was perceived as extremely painful. She reports lancinating pain and deep hot needle sticks as dysesthesias toes and feet.   Proprioception is tested in the upper extremities only. There is ataxia and dysmetria. Tremor was noted before she initiated the movement .   Coordination: Rapid alternating movements in the fingers/hands is slowed . Finger-to-nose maneuver with  Bilateral evidence of ataxia, dysmetria and mild  tremor.  Gait and station: Patient walks with assistive device and would be more stable with a walker.  Deep tendon reflexes: in the  upper and lower extremities are attenuated. Babinski maneuver response is unresponsive .   Assessment:  After physical and neurologic examination, review of laboratory studies, imaging, neurophysiology testing and pre-existing records, assessment will be reviewed  on the problem list.  #1 secondary parkinsonism following decades of Reglan use. #2 spinal stenosis with gait disorder, chronic lower back pain, hip and leg pain and propulsive tendency. Recommend physical therapy for assessment of walker or mult-ipod cane . #3 painful peripheral neuropathy especially with sensory abnormalities in the feet and lower extremity below the knee.  Plan:  Treatment plan and additional workup will be reviewed under Problem List. Please refer to rehab and   Neurosurgery for back evaluation .  No further neurologic work up.

## 2013-10-06 ENCOUNTER — Encounter: Payer: Self-pay | Admitting: Family

## 2013-10-07 ENCOUNTER — Other Ambulatory Visit (HOSPITAL_COMMUNITY): Payer: Medicare Other

## 2013-10-07 ENCOUNTER — Ambulatory Visit: Payer: Medicare Other | Admitting: Family

## 2013-11-24 ENCOUNTER — Encounter: Payer: Self-pay | Admitting: Family

## 2013-11-25 ENCOUNTER — Encounter: Payer: Self-pay | Admitting: Family

## 2013-11-25 ENCOUNTER — Ambulatory Visit (INDEPENDENT_AMBULATORY_CARE_PROVIDER_SITE_OTHER): Payer: Medicare Other | Admitting: Family

## 2013-11-25 ENCOUNTER — Ambulatory Visit (HOSPITAL_COMMUNITY)
Admission: RE | Admit: 2013-11-25 | Discharge: 2013-11-25 | Disposition: A | Discharge status: Home / Self Care | Payer: Medicare Other | Source: Ambulatory Visit | Attending: Vascular Surgery | Admitting: Vascular Surgery

## 2013-11-25 VITALS — BP 136/73 | HR 71 | Resp 16 | Ht 64.0 in | Wt 125.0 lb

## 2013-11-25 DIAGNOSIS — I6529 Occlusion and stenosis of unspecified carotid artery: Secondary | ICD-10-CM

## 2013-11-25 DIAGNOSIS — I658 Occlusion and stenosis of other precerebral arteries: Secondary | ICD-10-CM | POA: Insufficient documentation

## 2013-11-25 DIAGNOSIS — Z48812 Encounter for surgical aftercare following surgery on the circulatory system: Secondary | ICD-10-CM

## 2013-11-25 NOTE — Patient Instructions (Addendum)
Stroke Prevention Some medical conditions and behaviors are associated with an increased chance of having a stroke. You may prevent a stroke by making healthy choices and managing medical conditions. HOW CAN I REDUCE MY RISK OF HAVING A STROKE?   Stay physically active. Get at least 30 minutes of activity on most or all days.  Do not smoke. It may also be helpful to avoid exposure to secondhand smoke.  Limit alcohol use. Moderate alcohol use is considered to be:  No more than 2 drinks per day for men.  No more than 1 drink per day for nonpregnant women.  Eat healthy foods. This involves  Eating 5 or more servings of fruits and vegetables a day.  Following a diet that addresses high blood pressure (hypertension), high cholesterol, diabetes, or obesity.  Manage your cholesterol levels.  A diet low in saturated fat, trans fat, and cholesterol and high in fiber may control cholesterol levels.  Take any prescribed medicines to control cholesterol as directed by your health care provider.  Manage your diabetes.  A controlled-carbohydrate, controlled-sugar diet is recommended to manage diabetes.  Take any prescribed medicines to control diabetes as directed by your health care provider.  Control your hypertension.  A low-salt (sodium), low-saturated fat, low-trans fat, and low-cholesterol diet is recommended to manage hypertension.  Take any prescribed medicines to control hypertension as directed by your health care provider.  Maintain a healthy weight.  A reduced-calorie, low-sodium, low-saturated fat, low-trans fat, low-cholesterol diet is recommended to manage weight.  Stop drug abuse.  Avoid taking birth control pills.  Talk to your health care provider about the risks of taking birth control pills if you are over 80 years old, smoke, get migraines, or have ever had a blood clot.  Get evaluated for sleep disorders (sleep apnea).  Talk to your health care provider about  getting a sleep evaluation if you snore a lot or have excessive sleepiness.  Take medicines as directed by your health care provider.  For some people, aspirin or blood thinners (anticoagulants) are helpful in reducing the risk of forming abnormal blood clots that can lead to stroke. If you have the irregular heart rhythm of atrial fibrillation, you should be on a blood thinner unless there is a good reason you cannot take them.  Understand all your medicine instructions.  Make sure that other other conditions (such as anemia or atherosclerosis) are addressed. SEEK IMMEDIATE MEDICAL CARE IF:   You have sudden weakness or numbness of the face, arm, or leg, especially on one side of the body.  Your face or eyelid droops to one side.  You have sudden confusion.  You have trouble speaking (aphasia) or understanding.  You have sudden trouble seeing in one or both eyes.  You have sudden trouble walking.  You have dizziness.  You have a loss of balance or coordination.  You have a sudden, severe headache with no known cause.  You have new chest pain or an irregular heartbeat. Any of these symptoms may represent a serious problem that is an emergency. Do not wait to see if the symptoms will go away. Get medical help at once. Call your local emergency services  (911 in [place] Do not drive yourself to the hospital. Document Released: 11/22/2004 Document Revised: 08/05/2013 Document Reviewed: 04/17/2013 [facility] Patient Information 2014 [place].  Smoking Cessation Quitting smoking is important to your health and has many advantages. However, it is not always easy to quit since nicotine is a very  addictive drug. Often times, people try 3 times or more before being able to quit. This document explains the best ways for you to prepare to quit smoking. Quitting takes hard work and a lot of effort, but you can do it. ADVANTAGES OF QUITTING SMOKING  You will live longer, feel better, and  live better.  Your body will feel the impact of quitting smoking almost immediately.  Within 20 minutes, blood pressure decreases. Your pulse returns to its normal level.  After 8 hours, carbon monoxide levels in the blood return to normal. Your oxygen level increases.  After 24 hours, the chance of having a heart attack starts to decrease. Your breath, hair, and body stop smelling like smoke.  After 48 hours, damaged nerve endings begin to recover. Your sense of taste and smell improve.  After 72 hours, the body is virtually free of nicotine. Your bronchial tubes relax and breathing becomes easier.  After 2 to 12 weeks, lungs can hold more air. Exercise becomes easier and circulation improves.  The risk of having a heart attack, stroke, cancer, or lung disease is greatly reduced.  After 1 year, the risk of coronary heart disease is cut in half.  After 5 years, the risk of stroke falls to the same as a nonsmoker.  After 10 years, the risk of lung cancer is cut in half and the risk of other cancers decreases significantly.  After 15 years, the risk of coronary heart disease drops, usually to the level of a nonsmoker.  If you are pregnant, quitting smoking will improve your chances of having a healthy baby.  The people you live with, especially any children, will be healthier.  You will have extra money to spend on things other than cigarettes. QUESTIONS TO THINK ABOUT BEFORE ATTEMPTING TO QUIT You may want to talk about your answers with your caregiver.  Why do you want to quit?  If you tried to quit in the past, what helped and what did not?  What will be the most difficult situations for you after you quit? How will you plan to handle them?  Who can help you through the tough times? Your family? Friends? A caregiver?  What pleasures do you get from smoking? What ways can you still get pleasure if you quit? Here are some questions to ask your caregiver:  How can you help  me to be successful at quitting?  What medicine do you think would be best for me and how should I take it?  What should I do if I need more help?  What is smoking withdrawal like? How can I get information on withdrawal? GET READY  Set a quit date.  Change your environment by getting rid of all cigarettes, ashtrays, matches, and lighters in your home, car, or work. Do not let people smoke in your home.  Review your past attempts to quit. Think about what worked and what did not. GET SUPPORT AND ENCOURAGEMENT You have a better chance of being successful if you have help. You can get support in many ways.  Tell your family, friends, and co-workers that you are going to quit and need their support. Ask them not to smoke around you.  Get individual, group, or telephone counseling and support. Programs are available at [employer] and health centers. Call your local health department for information about programs in your area.  Spiritual beliefs and practices may help some smokers quit.  Download a "quit meter" on your computer to  keep track of quit statistics, such as how long you have gone without smoking, cigarettes not smoked, and money saved.  Get a self-help book about quitting smoking and staying off of tobacco. [place] yourself from urges to smoke. Talk to someone, go for a walk, or occupy your time with a task.  Change your normal routine. Take a different route to work. Drink tea instead of coffee. Eat breakfast in a different place.  Reduce your stress. Take a hot bath, exercise, or read a book.  Plan something enjoyable to do every day. Reward yourself for not smoking.  Explore interactive web-based programs that specialize in helping you quit. GET MEDICINE AND USE IT CORRECTLY Medicines can help you stop smoking and decrease the urge to smoke. Combining medicine with the above behavioral methods and support can greatly increase your  chances of successfully quitting smoking.  Nicotine replacement therapy helps deliver nicotine to your body without the negative effects and risks of smoking. Nicotine replacement therapy includes nicotine gum, lozenges, inhalers, nasal sprays, and skin patches. Some may be available over-the-counter and others require a prescription.  Antidepressant medicine helps people abstain from smoking, but how this works is unknown. This medicine is available by prescription.  Nicotinic receptor partial agonist medicine simulates the effect of nicotine in your brain. This medicine is available by prescription. Ask your caregiver for advice about which medicines to use and how to use them based on your health history. Your caregiver will tell you what side effects to look out for if you choose to be on a medicine or therapy. Carefully read the information on the package. Do not use any other product containing nicotine while using a nicotine replacement product.  RELAPSE OR DIFFICULT SITUATIONS Most relapses occur within the first 3 months after quitting. Do not be discouraged if you start smoking again. Remember, most people try several times before finally quitting. You may have symptoms of withdrawal because your body is used to nicotine. You may crave cigarettes, be irritable, feel very hungry, cough often, get headaches, or have difficulty concentrating. The withdrawal symptoms are only temporary. They are strongest when you first quit, but they will go away within 10 14 days. To reduce the chances of relapse, try to:  Avoid drinking alcohol. Drinking lowers your chances of successfully quitting.  Reduce the amount of caffeine you consume. Once you quit smoking, the amount of caffeine in your body increases and can give you symptoms, such as a rapid heartbeat, sweating, and anxiety.  Avoid smokers because they can make you want to smoke.  Do not let weight gain distract you. Many smokers will gain  weight when they quit, usually less than 10 pounds. Eat a healthy diet and stay active. You can always lose the weight gained after you quit.  Find ways to improve your mood other than smoking. FOR MORE INFORMATION  www.smokefree.gov  Document Released: 10/09/2001 Document Revised: 04/15/2012 Document Reviewed: 01/24/2012 [facility] Patient Information 2014 [place], [place].

## 2013-11-25 NOTE — Progress Notes (Signed)
Established Carotid Patient  History of Present Illness  [name] is a 68 y.o. female patient of Dr. Scot Dock who underwent a left carotid endarterectomy in September of 2011. She returns today for routine surveillance.  She has a history of partial gastrectomy for gastric ulcer.  Patient has Negative history of TIA or stroke symptom.  The patient denies amaurosis fugax or monocular blindness.  The patient  denies facial drooping.  Pt. denies hemiplegia.  The patient denies receptive or expressive aphasia.   Pt has L4-5 "bad discs", and stenosis, states chronic low back pain with intermittent radiculopathy in both legs. Pt reports New Medical or Surgical History: recently diagnosed with Parkinson's Disease. Pt reports neuropathy in feet, painful to touch, does not know if this is from her back issues.  Pt Diabetic: No, states she previously possibly had DM, has occasional hypoglycemia Pt smoker: smoker  (2 ppd x 50 yrs)  Pt meds include: Statin : Yes ASA: Yes Other anticoagulants/antiplatelets: no   Past Medical History  Diagnosis Date  . Asthma   . HTN (hypertension)   . Grave's disease   . Fibromyalgia   . Thrombophlebitis   . Depression   . Angina   . Shortness of breath   . COPD (chronic obstructive pulmonary disease)   . Sleep apnea   . Blood dyscrasia     hx of thrombphlebitis  . Recurrent upper respiratory infection (URI)   . GERD (gastroesophageal reflux disease)   . Headache(784.0)   . Arthritis   . CAD (coronary artery disease)   . Chronic kidney disease   . Peripheral vascular disease   . Diabetes mellitus   . Ulcer   . Diarrhea     chronic   . Thyroid disorder   . Anxiety and depression   . Chronic pain     leg and feet  . DDD (degenerative disc disease)   . Rhinitis   . OP (osteoporosis)   . Vitamin B 12 deficiency   . PUD (peptic ulcer disease)   . DDD (degenerative disc disease)   . Spinal stenosis   . Spinal stenosis of lumbar region  04/23/2013  . Tobacco use disorder 04/23/2013  . DVT (deep venous thrombosis)     Social History History  Substance Use Topics  . Smoking status: Current Every Day Smoker -- 2.00 packs/day for 50 years    Types: Cigarettes  . Smokeless tobacco: Never Used     Comment: cessation info given and reviewed  . Alcohol Use: No    Family History Family History  Problem Relation Age of Onset  . Diabetes Mother   . Hyperlipidemia Mother   . Heart attack Mother   . Other Mother     varicose veins,respiratory,stroke  . Heart disease Mother     before age 17  . Hypertension Mother   . Varicose Veins Mother   . Diabetes Father   . Heart disease Father     before age 59  . Hyperlipidemia Father   . Heart attack Father   . Other Father     varicose veins  . Hypertension Father   . Varicose Veins Father   . Diabetes Sister   . Heart disease Sister     before age 72  . Hyperlipidemia Sister   . Heart attack Sister   . Other Sister     varicose veins  . Hypertension Sister   . Varicose Veins Sister   . Peripheral vascular disease Sister   .  Diabetes Sister   . Hyperlipidemia Sister   . Hypertension Sister   . Varicose Veins Sister     Surgical History Past Surgical History  Procedure Laterality Date  . Gastrectomy      age 28   Part of small intestin and part of stomach  . Cholecystectomy  1999  . Carotid endarterectomy Left Sept. 20,2011    cea    Allergies  Allergen Reactions  . Clarithromycin Nausea Only    Current Outpatient Prescriptions  Medication Sig Dispense Refill  . albuterol-ipratropium (COMBIVENT) 18-103 MCG/ACT inhaler Inhale 2 puffs into the lungs every 6 (six) hours as needed. For shortness of breath       . amLODipine (NORVASC) 5 MG tablet Take 5 mg by mouth daily.        [name] aspirin 81 MG tablet Take 81 mg by mouth daily.        . calcium acetate (PHOSLO) 667 MG capsule Take 667 mg by mouth 3 (three) times daily with meals.       .  Chlorpheniramine-Acetaminophen (CORICIDIN HBP COLD/FLU PO) Take 1 tablet by mouth 2 (two) times daily.      . clonazePAM (KLONOPIN) 1 MG tablet Take 1 mg by mouth 2 (two) times daily as needed. For anxiety      . dicyclomine (BENTYL) 20 MG tablet Take 20 mg by mouth every 6 (six) hours.       . DULoxetine (CYMBALTA) 60 MG capsule Take 60 mg by mouth daily.        . fentaNYL (DURAGESIC - DOSED MCG/HR) 75 MCG/HR Place 1 patch onto the skin every other day.       . fluticasone (FLONASE) 50 MCG/ACT nasal spray Place 2 sprays into the nose at bedtime.      [name] guaiFENesin (MUCINEX) 600 MG 12 hr tablet Take 600 mg by mouth 2 (two) times daily.      . hyoscyamine (LEVSIN SL) 0.125 MG SL tablet Place 0.125 mg under the tongue every 4 (four) hours as needed. Before meals as needed      . metoCLOPramide (REGLAN) 5 MG tablet Take 5 mg by mouth 4 (four) times daily. Take 1 tablet 30 minutes before meals and at bedtime       . moxifloxacin (VIGAMOX) 0.5 % ophthalmic solution Place 1 drop into both eyes 2 (two) times daily.        . nitroGLYCERIN (NITROSTAT) 0.4 MG SL tablet Place 0.4 mg under the tongue every 5 (five) minutes as needed. For chest pain       . oxyCODONE (OXY IR/ROXICODONE) 5 MG immediate release tablet Take 5 mg by mouth every 4 (four) hours as needed. For pain       . pantoprazole (PROTONIX) 40 MG tablet Take 40 mg by mouth 2 (two) times daily.       . predniSONE (DELTASONE) 5 MG tablet Take 5 mg by mouth every other day.        . Prenatal Vit-Fe Fumarate-FA (MULTIVITAMIN-PRENATAL) 27-0.8 MG TABS Take 1 tablet by mouth daily.        . simvastatin (ZOCOR) 10 MG tablet Take 10 mg by mouth at bedtime.        . Vitamin D, Ergocalciferol, (DRISDOL) 50000 UNITS CAPS Take 50,000 Units by mouth every 7 (seven) days. Every saturday       No current facility-administered medications for this visit.    Review of Systems : See HPI for pertinent positives and negatives.  Physical Examination  Filed  Vitals:   11/25/13 1509  BP: 136/73  Pulse: 71  Resp: 16   Filed Weights   11/25/13 1509  Weight: 125 lb (56.7 kg)   Body mass index is 21.45 kg/(m^2).   General: WDWN female in NAD GAIT: antalgic Eyes: PERRLA, bilateral exophthalmos (history of Grave's Disease)  Pulmonary:  CTAB, Negative  Rales, Negative rhonchi, & Negative wheezing.  Cardiac: regular Rhythm ,  Negative detected Murmurs.  VASCULAR EXAM Carotid Bruits Left Right   Negative Negative    Aorta is not palpable. Radial pulses are 2+ palpable and equal.                                                                                                                            LE Pulses LEFT RIGHT       POSTERIOR TIBIAL   palpable   not palpable        DORSALIS PEDIS      ANTERIOR TIBIAL not palpable   palpable     Gastrointestinal: soft, nontender, BS WNL, no r/g,  negative masses.  Musculoskeletal: Negative muscle atrophy/wasting. M/S 3/5 in UE's, 2/5 in LE's, Extremities without ischemic changes.  Neurologic: A&O X 3; Appropriate Affect ; SENSATION ;normal;  Speech is normal CN 2-12 intact except is slightly hard of hearing, Pain and light touch intact in extremities except has pain to touch in feet, Motor exam as listed above.   Non-Invasive Vascular Imaging CAROTID DUPLEX 11/25/2013   Right ICA: 60 - 79 % stenosis. Left ICA: <40% stenosis.  Previous carotid studies demonstrated: RICA 60 - 79 % stenosis, LICA no stenosis.  These findings are Worse in the left ICA from previous exam.  Assessment: [name] is a 68 y.o. female who presents with asymptomatic 80 - 79 % Right ICA stenosis and minimal left ICA stenosis. The left  ICA stenosis is slightly  Worse from previous exam seven months ago.  Plan: Follow-up in 6 months with Carotid Duplex scan. Pt was counseled re smoking cessation.   I discussed in depth with the patient the nature of atherosclerosis, and emphasized the importance of  maximal medical management including strict control of blood pressure, blood glucose, and lipid levels, obtaining regular exercise, and cessation of smoking.  The patient is aware that without maximal medical management the underlying atherosclerotic disease process will progress, limiting the benefit of any interventions. The patient was given information about stroke prevention and what symptoms should prompt the patient to seek immediate medical care. Thank you for allowing [place] to participate in this patient's care.  Clemon Chambers, RN, MSN, FNP-C [facility] Office: [phone]  Clinic Physician: Scot Dock  11/25/2013 3:24 PM

## 2013-11-26 NOTE — Addendum Note (Signed)
Addended by: Mena Goes on: 11/26/2013 11:42 AM   Modules accepted: Orders

## 2014-01-26 ENCOUNTER — Ambulatory Visit (HOSPITAL_COMMUNITY)
Admission: RE | Admit: 2014-01-26 | Discharge: 2014-01-26 | Disposition: A | Discharge status: Home / Self Care | Payer: Medicare Other | Source: Ambulatory Visit | Attending: Physical Medicine and Rehabilitation | Admitting: Physical Medicine and Rehabilitation

## 2014-01-26 ENCOUNTER — Other Ambulatory Visit (HOSPITAL_COMMUNITY): Payer: Self-pay | Admitting: Physical Medicine and Rehabilitation

## 2014-01-26 DIAGNOSIS — R52 Pain, unspecified: Secondary | ICD-10-CM

## 2014-01-26 DIAGNOSIS — M545 Low back pain, unspecified: Secondary | ICD-10-CM | POA: Insufficient documentation

## 2014-05-25 ENCOUNTER — Encounter: Payer: Self-pay | Admitting: Family

## 2014-05-26 ENCOUNTER — Ambulatory Visit (HOSPITAL_COMMUNITY)
Admission: RE | Admit: 2014-05-26 | Discharge: 2014-05-26 | Disposition: A | Discharge status: Home / Self Care | Payer: Medicare Other | Source: Ambulatory Visit | Attending: Vascular Surgery | Admitting: Vascular Surgery

## 2014-05-26 ENCOUNTER — Encounter: Payer: Self-pay | Admitting: Family

## 2014-05-26 ENCOUNTER — Ambulatory Visit (INDEPENDENT_AMBULATORY_CARE_PROVIDER_SITE_OTHER): Payer: Medicare Other | Admitting: Family

## 2014-05-26 VITALS — BP 119/72 | HR 79 | Resp 16 | Ht 64.0 in | Wt 123.0 lb

## 2014-05-26 DIAGNOSIS — I6529 Occlusion and stenosis of unspecified carotid artery: Secondary | ICD-10-CM

## 2014-05-26 DIAGNOSIS — Z48812 Encounter for surgical aftercare following surgery on the circulatory system: Secondary | ICD-10-CM

## 2014-05-26 NOTE — Progress Notes (Signed)
Established Carotid Patient   History of Present Illness  [name] is a 68 y.o. female patient of Dr. Scot Dock who underwent a left carotid endarterectomy in September of 2011.  She returns today for routine surveillance.  She has a history of partial gastrectomy for gastric ulcer.  Patient has Negative history of TIA or stroke symptom. The patient denies amaurosis fugax or monocular blindness. The patient denies facial drooping.  Pt. denies hemiplegia. The patient denies receptive or expressive aphasia.  Pt has L4-5 "bad discs", and stenosis, states chronic low back pain with intermittent radiculopathy in both legs.  Pt reports New Medical or Surgical History: recently diagnosed with Parkinson's Disease.  Pt reports neuropathy in feet, painful to touch, does not know if this is from her back issues.   Pt Diabetic: No, states she previously possibly had DM, has occasional hypoglycemia  Pt smoker: smoker (2 ppd x 50 yrs)  Pt meds include:  Statin : Yes  ASA: Yes  Other anticoagulants/antiplatelets: no   Past Medical History  Diagnosis Date  . Asthma   . HTN (hypertension)   . Grave's disease   . Fibromyalgia   . Thrombophlebitis   . Depression   . Angina   . Shortness of breath   . COPD (chronic obstructive pulmonary disease)   . Sleep apnea   . Blood dyscrasia     hx of thrombphlebitis  . Recurrent upper respiratory infection (URI)   . GERD (gastroesophageal reflux disease)   . Headache(784.0)   . Arthritis   . CAD (coronary artery disease)   . Chronic kidney disease   . Peripheral vascular disease   . Diabetes mellitus   . Ulcer   . Diarrhea     chronic   . Thyroid disorder   . Anxiety and depression   . Chronic pain     leg and feet  . DDD (degenerative disc disease)   . Rhinitis   . OP (osteoporosis)   . Vitamin B 12 deficiency   . PUD (peptic ulcer disease)   . DDD (degenerative disc disease)   . Spinal stenosis   . Spinal stenosis of lumbar region  04/23/2013  . Tobacco use disorder 04/23/2013  . DVT (deep venous thrombosis)     Social History History  Substance Use Topics  . Smoking status: Current Every Day Smoker -- 2.00 packs/day for 50 years    Types: Cigarettes  . Smokeless tobacco: Never Used     Comment: cessation info given and reviewed  . Alcohol Use: No    Family History Family History  Problem Relation Age of Onset  . Diabetes Mother   . Hyperlipidemia Mother   . Heart attack Mother   . Other Mother     varicose veins,respiratory,stroke  . Heart disease Mother     before age 72  . Hypertension Mother   . Varicose Veins Mother   . Diabetes Father   . Heart disease Father     before age 64  . Hyperlipidemia Father   . Heart attack Father   . Other Father     varicose veins  . Hypertension Father   . Varicose Veins Father   . Diabetes Sister   . Heart disease Sister     before age 62  . Hyperlipidemia Sister   . Heart attack Sister   . Other Sister     varicose veins  . Hypertension Sister   . Varicose Veins Sister   . Peripheral  vascular disease Sister   . Diabetes Sister   . Hyperlipidemia Sister   . Hypertension Sister   . Varicose Veins Sister     Surgical History Past Surgical History  Procedure Laterality Date  . Gastrectomy      age 89   Part of small intestin and part of stomach  . Cholecystectomy  1999  . Carotid endarterectomy Left Sept. 20,2011    cea    Allergies  Allergen Reactions  . Clarithromycin Nausea And Vomiting and Nausea Only    Current Outpatient Prescriptions  Medication Sig Dispense Refill  . albuterol-ipratropium (COMBIVENT) 18-103 MCG/ACT inhaler Inhale 2 puffs into the lungs every 6 (six) hours as needed. For shortness of breath       . amLODipine (NORVASC) 5 MG tablet Take 5 mg by mouth daily.        [name] aspirin 81 MG tablet Take 81 mg by mouth daily.        . calcium acetate (PHOSLO) 667 MG capsule Take 667 mg by mouth 3 (three) times daily with meals.        . Chlorpheniramine-Acetaminophen (CORICIDIN HBP COLD/FLU PO) Take 1 tablet by mouth 2 (two) times daily.      . clonazePAM (KLONOPIN) 1 MG tablet Take 1 mg by mouth 2 (two) times daily as needed. For anxiety      . dicyclomine (BENTYL) 20 MG tablet Take 20 mg by mouth every 6 (six) hours.       . DULoxetine (CYMBALTA) 60 MG capsule Take 60 mg by mouth daily.        . fentaNYL (DURAGESIC - DOSED MCG/HR) 75 MCG/HR Place 1 patch onto the skin every other day.       . fluticasone (FLONASE) 50 MCG/ACT nasal spray Place 2 sprays into the nose at bedtime.      [name] guaiFENesin (MUCINEX) 600 MG 12 hr tablet Take 600 mg by mouth 2 (two) times daily.      . hyoscyamine (LEVSIN SL) 0.125 MG SL tablet Place 0.125 mg under the tongue every 4 (four) hours as needed. Before meals as needed      . metoCLOPramide (REGLAN) 5 MG tablet Take 5 mg by mouth 4 (four) times daily. Take 1 tablet 30 minutes before meals and at bedtime       . moxifloxacin (VIGAMOX) 0.5 % ophthalmic solution Place 1 drop into both eyes 2 (two) times daily.        . nitroGLYCERIN (NITROSTAT) 0.4 MG SL tablet Place 0.4 mg under the tongue every 5 (five) minutes as needed. For chest pain       . oxyCODONE (OXY IR/ROXICODONE) 5 MG immediate release tablet Take 5 mg by mouth every 4 (four) hours as needed. For pain       . pantoprazole (PROTONIX) 40 MG tablet Take 40 mg by mouth 2 (two) times daily.       . predniSONE (DELTASONE) 5 MG tablet Take 5 mg by mouth every other day.        . Prenatal Vit-Fe Fumarate-FA (MULTIVITAMIN-PRENATAL) 27-0.8 MG TABS Take 1 tablet by mouth daily.        . simvastatin (ZOCOR) 10 MG tablet Take 10 mg by mouth at bedtime.        . Vitamin D, Ergocalciferol, (DRISDOL) 50000 UNITS CAPS Take 50,000 Units by mouth every 7 (seven) days. Every saturday       No current facility-administered medications for this visit.    Review of  Systems : See HPI for pertinent positives and negatives.  Physical  Examination  Filed Vitals:   05/26/14 1439 05/26/14 1441  BP: 124/73 119/72  Pulse: 78 79  Resp:  16  Height:  5\' 4"  (1.626 m)  Weight:  123 lb (55.792 kg)  SpO2:  100%   Body mass index is 21.1 kg/(m^2).  General: WDWN female in NAD  GAIT: antalgic  Eyes: PERRLA, bilateral exophthalmos (history of Grave's Disease)  Pulmonary: CTAB, Negative Rales, Negative rhonchi, & Negative wheezing.  Cardiac: regular Rhythm , Negative detected Murmurs.  VASCULAR EXAM  Carotid Bruits  Left  Right    Negative  Negative   Aorta is not palpable.  Radial pulses are 2+ palpable and equal.  LE Pulses  LEFT  RIGHT   POSTERIOR TIBIAL  palpable  not palpable   DORSALIS PEDIS  ANTERIOR TIBIAL  not palpable  palpable   Gastrointestinal: soft, nontender, BS WNL, no r/g, negative masses.  Musculoskeletal: Negative muscle atrophy/wasting. M/S 3/5 in UE's, 2/5 in LE's, Extremities without ischemic changes.  Neurologic: A&O X 3; Appropriate Affect ; SENSATION ;normal;  Speech is normal  CN 2-12 intact except is slightly hard of hearing, Pain and light touch intact in extremities except has pain to touch in feet, Motor exam as listed above.   Non-Invasive Vascular Imaging CAROTID DUPLEX 05/26/2014   CEREBROVASCULAR DUPLEX EVALUATION    INDICATION: Carotid artery disease    PREVIOUS INTERVENTION(S): Left carotid endarterectomy 07/18/2010.    DUPLEX EXAM: Carotid duplex    RIGHT  LEFT  Peak Systolic Velocities (cm/s) End Diastolic Velocities (cm/s) Plaque LOCATION Peak Systolic Velocities (cm/s) End Diastolic Velocities (cm/s) Plaque  71 20 - CCA PROXIMAL 75 22 -  69 19 HT CCA MID 82 26 HT  59 11 HT CCA DISTAL 117 27 HM  249 24 - ECA 85 16 -  255 65 HT ICA PROXIMAL 92 21 HM  221 32 - ICA MID 138 45 -  102 32 - ICA DISTAL 142 45 -    3.69 ICA / CCA Ratio (PSV) N/A  Antegrade Vertebral Flow Antegrade  951 Brachial Systolic Pressure (mmHg) 884  Triphasic Brachial Artery Waveforms Triphasic     Plaque Morphology:  HM = Homogeneous, HT = Heterogeneous, CP = Calcific Plaque, SP = Smooth Plaque, IP = Irregular Plaque     ADDITIONAL FINDINGS: Slight tortuosity in the right proximal internal carotid artery may account for slight increase of velocity.    IMPRESSION: 1. 60 - 79% right internal carotid artery stenosis. 2. Patent left carotid endarterectomy . 3. Right external carotid artery stenosis. 4. Increased diastolic velocity noted in the left mid to distal internal carotid artery.    Compared to the previous exam:  No significant hange.    Assessment: [name] is a 68 y.o. female who is s/p left carotid endarterectomy in September of 2011.  She has no history of stroke or TIA. Carotid Duplex today demonstrates 60 - 79% right internal carotid artery stenosis, patent left carotid endarterectomy, and right external carotid artery stenosis. Increased diastolic velocity noted in the left mid to distal internal carotid artery. The  ICA stenosis is  Unchanged from previous exam.  Plan: Follow-up in 6 months with Carotid Duplex scan. She was again counseled re smoking cessation.   I discussed in depth with the patient the nature of atherosclerosis, and emphasized the importance of maximal medical management including strict control of blood pressure, blood glucose, and lipid levels, obtaining  regular exercise, and cessation of smoking.  The patient is aware that without maximal medical management the underlying atherosclerotic disease process will progress, limiting the benefit of any interventions. The patient was given information about stroke prevention and what symptoms should prompt the patient to seek immediate medical care. Thank you for allowing [place] to participate in this patient's care.  Clemon Chambers, RN, MSN, FNP-C [facility] Office: [phone]  Clinic Physician: Scot Dock  05/26/2014 2:43 PM

## 2014-05-26 NOTE — Patient Instructions (Signed)
Stroke Prevention Some medical conditions and behaviors are associated with an increased chance of having a stroke. You may prevent a stroke by making healthy choices and managing medical conditions. HOW CAN I REDUCE MY RISK OF HAVING A STROKE?   Stay physically active. Get at least 30 minutes of activity on most or all days.  Do not smoke. It may also be helpful to avoid exposure to secondhand smoke.  Limit alcohol use. Moderate alcohol use is considered to be:  No more than 2 drinks per day for men.  No more than 1 drink per day for nonpregnant women.  Eat healthy foods. This involves:  Eating 5 or more servings of fruits and vegetables a day.  Making dietary changes that address high blood pressure (hypertension), high cholesterol, diabetes, or obesity.  Manage your cholesterol levels.  Making food choices that are high in fiber and low in saturated fat, trans fat, and cholesterol may control cholesterol levels.  Take any prescribed medicines to control cholesterol as directed by your health care provider.  Manage your diabetes.  Controlling your carbohydrate and sugar intake is recommended to manage diabetes.  Take any prescribed medicines to control diabetes as directed by your health care provider.  Control your hypertension.  Making food choices that are low in salt (sodium), saturated fat, trans fat, and cholesterol is recommended to manage hypertension.  Take any prescribed medicines to control hypertension as directed by your health care provider.  Maintain a healthy weight.  Reducing calorie intake and making food choices that are low in sodium, saturated fat, trans fat, and cholesterol are recommended to manage weight.  Stop drug abuse.  Avoid taking birth control pills.  Talk to your health care provider about the risks of taking birth control pills if you are over 35 years old, smoke, get migraines, or have ever had a blood clot.  Get evaluated for sleep  disorders (sleep apnea).  Talk to your health care provider about getting a sleep evaluation if you snore a lot or have excessive sleepiness.  Take medicines only as directed by your health care provider.  For some people, aspirin or blood thinners (anticoagulants) are helpful in reducing the risk of forming abnormal blood clots that can lead to stroke. If you have the irregular heart rhythm of atrial fibrillation, you should be on a blood thinner unless there is a good reason you cannot take them.  Understand all your medicine instructions.  Make sure that other conditions (such as anemia or atherosclerosis) are addressed. SEEK IMMEDIATE MEDICAL CARE IF:   You have sudden weakness or numbness of the face, arm, or leg, especially on one side of the body.  Your face or eyelid droops to one side.  You have sudden confusion.  You have trouble speaking (aphasia) or understanding.  You have sudden trouble seeing in one or both eyes.  You have sudden trouble walking.  You have dizziness.  You have a loss of balance or coordination.  You have a sudden, severe headache with no known cause.  You have new chest pain or an irregular heartbeat. Any of these symptoms may represent a serious problem that is an emergency. Do not wait to see if the symptoms will go away. Get medical help at once. Call your local emergency services (911 in [place] Do not drive yourself to the hospital. Document Released: 11/22/2004 Document Revised: 03/01/2014 Document Reviewed: 04/17/2013 [employer] Patient Information 2015 [employer]. This information is not intended to replace advice given   to you by your health care provider. Make sure you discuss any questions you have with your health care provider.   Smoking Cessation Quitting smoking is important to your health and has many advantages. However, it is not always easy to quit since nicotine is a very addictive drug. Oftentimes, people try 3 times or more  before being able to quit. This document explains the best ways for you to prepare to quit smoking. Quitting takes hard work and a lot of effort, but you can do it. ADVANTAGES OF QUITTING SMOKING  You will live longer, feel better, and live better.  Your body will feel the impact of quitting smoking almost immediately.  Within 20 minutes, blood pressure decreases. Your pulse returns to its normal level.  After 8 hours, carbon monoxide levels in the blood return to normal. Your oxygen level increases.  After 24 hours, the chance of having a heart attack starts to decrease. Your breath, hair, and body stop smelling like smoke.  After 48 hours, damaged nerve endings begin to recover. Your sense of taste and smell improve.  After 72 hours, the body is virtually free of nicotine. Your bronchial tubes relax and breathing becomes easier.  After 2 to 12 weeks, lungs can hold more air. Exercise becomes easier and circulation improves.  The risk of having a heart attack, stroke, cancer, or lung disease is greatly reduced.  After 1 year, the risk of coronary heart disease is cut in half.  After 5 years, the risk of stroke falls to the same as a nonsmoker.  After 10 years, the risk of lung cancer is cut in half and the risk of other cancers decreases significantly.  After 15 years, the risk of coronary heart disease drops, usually to the level of a nonsmoker.  If you are pregnant, quitting smoking will improve your chances of having a healthy baby.  The people you live with, especially any children, will be healthier.  You will have extra money to spend on things other than cigarettes. QUESTIONS TO THINK ABOUT BEFORE ATTEMPTING TO QUIT You may want to talk about your answers with your health care provider.  Why do you want to quit?  If you tried to quit in the past, what helped and what did not?  What will be the most difficult situations for you after you quit? How will you plan to  handle them?  Who can help you through the tough times? Your family? Friends? A health care provider?  What pleasures do you get from smoking? What ways can you still get pleasure if you quit? Here are some questions to ask your health care provider:  How can you help me to be successful at quitting?  What medicine do you think would be best for me and how should I take it?  What should I do if I need more help?  What is smoking withdrawal like? How can I get information on withdrawal? GET READY  Set a quit date.  Change your environment by getting rid of all cigarettes, ashtrays, matches, and lighters in your home, car, or work. Do not let people smoke in your home.  Review your past attempts to quit. Think about what worked and what did not. GET SUPPORT AND ENCOURAGEMENT You have a better chance of being successful if you have help. You can get support in many ways.  Tell your family, friends, and coworkers that you are going to quit and need their support. Ask them not   to smoke around you.  Get individual, group, or telephone counseling and support. Programs are available at local hospitals and health centers. Call your local health department for information about programs in your area.  Spiritual beliefs and practices may help some smokers quit.  Download a "quit meter" on your computer to keep track of quit statistics, such as how long you have gone without smoking, cigarettes not smoked, and money saved.  Get a self-help book about quitting smoking and staying off tobacco. LEARN NEW SKILLS AND BEHAVIORS  Distract yourself from urges to smoke. Talk to someone, go for a walk, or occupy your time with a task.  Change your normal routine. Take a different route to work. Drink tea instead of coffee. Eat breakfast in a different place.  Reduce your stress. Take a hot bath, exercise, or read a book.  Plan something enjoyable to do every day. Reward yourself for not  smoking.  Explore interactive web-based programs that specialize in helping you quit. GET MEDICINE AND USE IT CORRECTLY Medicines can help you stop smoking and decrease the urge to smoke. Combining medicine with the above behavioral methods and support can greatly increase your chances of successfully quitting smoking.  Nicotine replacement therapy helps deliver nicotine to your body without the negative effects and risks of smoking. Nicotine replacement therapy includes nicotine gum, lozenges, inhalers, nasal sprays, and skin patches. Some may be available over-the-counter and others require a prescription.  Antidepressant medicine helps people abstain from smoking, but how this works is unknown. This medicine is available by prescription.  Nicotinic receptor partial agonist medicine simulates the effect of nicotine in your brain. This medicine is available by prescription. Ask your health care provider for advice about which medicines to use and how to use them based on your health history. Your health care provider will tell you what side effects to look out for if you choose to be on a medicine or therapy. Carefully read the information on the package. Do not use any other product containing nicotine while using a nicotine replacement product.  RELAPSE OR DIFFICULT SITUATIONS Most relapses occur within the first 3 months after quitting. Do not be discouraged if you start smoking again. Remember, most people try several times before finally quitting. You may have symptoms of withdrawal because your body is used to nicotine. You may crave cigarettes, be irritable, feel very hungry, cough often, get headaches, or have difficulty concentrating. The withdrawal symptoms are only temporary. They are strongest when you first quit, but they will go away within 10-14 days. To reduce the chances of relapse, try to:  Avoid drinking alcohol. Drinking lowers your chances of successfully quitting.  Reduce the  amount of caffeine you consume. Once you quit smoking, the amount of caffeine in your body increases and can give you symptoms, such as a rapid heartbeat, sweating, and anxiety.  Avoid smokers because they can make you want to smoke.  Do not let weight gain distract you. Many smokers will gain weight when they quit, usually less than 10 pounds. Eat a healthy diet and stay active. You can always lose the weight gained after you quit.  Find ways to improve your mood other than smoking. FOR MORE INFORMATION  www.smokefree.gov  Document Released: 10/09/2001 Document Revised: 03/01/2014 Document Reviewed: 01/24/2012 [employer] Patient Information 2015 [employer]. This information is not intended to replace advice given to you by your health care provider. Make sure you discuss any questions you have with your health care   provider.  

## 2014-05-26 NOTE — Addendum Note (Signed)
Addended by: Mena Goes on: 05/26/2014 03:42 PM   Modules accepted: Orders

## 2014-06-25 ENCOUNTER — Other Ambulatory Visit: Payer: Self-pay | Admitting: Physical Medicine and Rehabilitation

## 2014-06-25 DIAGNOSIS — M545 Low back pain, unspecified: Secondary | ICD-10-CM

## 2014-07-02 ENCOUNTER — Other Ambulatory Visit: Payer: Medicare Other

## 2014-07-09 ENCOUNTER — Ambulatory Visit
Admission: RE | Admit: 2014-07-09 | Discharge: 2014-07-09 | Disposition: A | Discharge status: Home / Self Care | Payer: Medicare Other | Source: Ambulatory Visit | Attending: Physical Medicine and Rehabilitation | Admitting: Physical Medicine and Rehabilitation

## 2014-07-09 DIAGNOSIS — M545 Low back pain, unspecified: Secondary | ICD-10-CM

## 2014-09-14 ENCOUNTER — Ambulatory Visit
Admission: RE | Admit: 2014-09-14 | Discharge: 2014-09-14 | Disposition: A | Discharge status: Home / Self Care | Payer: Medicare Other | Source: Ambulatory Visit | Attending: Physician Assistant | Admitting: Physician Assistant

## 2014-09-14 ENCOUNTER — Other Ambulatory Visit: Payer: Self-pay | Admitting: Physician Assistant

## 2014-09-14 DIAGNOSIS — M25551 Pain in right hip: Secondary | ICD-10-CM

## 2014-09-14 DIAGNOSIS — W19XXXA Unspecified fall, initial encounter: Secondary | ICD-10-CM

## 2014-09-14 DIAGNOSIS — M25552 Pain in left hip: Secondary | ICD-10-CM

## 2014-09-14 DIAGNOSIS — M545 Low back pain: Secondary | ICD-10-CM

## 2014-10-06 ENCOUNTER — Encounter (HOSPITAL_COMMUNITY): Payer: Self-pay | Admitting: Cardiology

## 2014-10-29 DIAGNOSIS — M790 Rheumatism, unspecified: Secondary | ICD-10-CM | POA: Diagnosis not present

## 2014-11-01 DIAGNOSIS — M790 Rheumatism, unspecified: Secondary | ICD-10-CM | POA: Diagnosis not present

## 2014-11-02 DIAGNOSIS — M790 Rheumatism, unspecified: Secondary | ICD-10-CM | POA: Diagnosis not present

## 2014-11-03 DIAGNOSIS — M549 Dorsalgia, unspecified: Secondary | ICD-10-CM | POA: Diagnosis not present

## 2014-11-03 DIAGNOSIS — M790 Rheumatism, unspecified: Secondary | ICD-10-CM | POA: Diagnosis not present

## 2014-11-03 DIAGNOSIS — M545 Low back pain: Secondary | ICD-10-CM | POA: Diagnosis not present

## 2014-11-04 DIAGNOSIS — E1129 Type 2 diabetes mellitus with other diabetic kidney complication: Secondary | ICD-10-CM | POA: Diagnosis not present

## 2014-11-04 DIAGNOSIS — I2510 Atherosclerotic heart disease of native coronary artery without angina pectoris: Secondary | ICD-10-CM | POA: Diagnosis not present

## 2014-11-04 DIAGNOSIS — M790 Rheumatism, unspecified: Secondary | ICD-10-CM | POA: Diagnosis not present

## 2014-11-04 DIAGNOSIS — N184 Chronic kidney disease, stage 4 (severe): Secondary | ICD-10-CM | POA: Diagnosis not present

## 2014-11-04 DIAGNOSIS — I10 Essential (primary) hypertension: Secondary | ICD-10-CM | POA: Diagnosis not present

## 2014-11-04 DIAGNOSIS — M810 Age-related osteoporosis without current pathological fracture: Secondary | ICD-10-CM | POA: Diagnosis not present

## 2014-11-04 DIAGNOSIS — E785 Hyperlipidemia, unspecified: Secondary | ICD-10-CM | POA: Diagnosis not present

## 2014-11-04 DIAGNOSIS — Z720 Tobacco use: Secondary | ICD-10-CM | POA: Diagnosis not present

## 2014-11-04 DIAGNOSIS — E538 Deficiency of other specified B group vitamins: Secondary | ICD-10-CM | POA: Diagnosis not present

## 2014-11-05 DIAGNOSIS — M790 Rheumatism, unspecified: Secondary | ICD-10-CM | POA: Diagnosis not present

## 2014-11-08 DIAGNOSIS — M790 Rheumatism, unspecified: Secondary | ICD-10-CM | POA: Diagnosis not present

## 2014-11-09 DIAGNOSIS — M790 Rheumatism, unspecified: Secondary | ICD-10-CM | POA: Diagnosis not present

## 2014-11-09 DIAGNOSIS — Z1211 Encounter for screening for malignant neoplasm of colon: Secondary | ICD-10-CM | POA: Diagnosis not present

## 2014-11-09 DIAGNOSIS — Z1212 Encounter for screening for malignant neoplasm of rectum: Secondary | ICD-10-CM | POA: Diagnosis not present

## 2014-11-10 DIAGNOSIS — M790 Rheumatism, unspecified: Secondary | ICD-10-CM | POA: Diagnosis not present

## 2014-11-11 DIAGNOSIS — M790 Rheumatism, unspecified: Secondary | ICD-10-CM | POA: Diagnosis not present

## 2014-11-12 DIAGNOSIS — M790 Rheumatism, unspecified: Secondary | ICD-10-CM | POA: Diagnosis not present

## 2014-11-15 DIAGNOSIS — M790 Rheumatism, unspecified: Secondary | ICD-10-CM | POA: Diagnosis not present

## 2014-11-16 DIAGNOSIS — M790 Rheumatism, unspecified: Secondary | ICD-10-CM | POA: Diagnosis not present

## 2014-11-17 DIAGNOSIS — M790 Rheumatism, unspecified: Secondary | ICD-10-CM | POA: Diagnosis not present

## 2014-11-18 DIAGNOSIS — M790 Rheumatism, unspecified: Secondary | ICD-10-CM | POA: Diagnosis not present

## 2014-11-19 DIAGNOSIS — M790 Rheumatism, unspecified: Secondary | ICD-10-CM | POA: Diagnosis not present

## 2014-11-22 DIAGNOSIS — M790 Rheumatism, unspecified: Secondary | ICD-10-CM | POA: Diagnosis not present

## 2014-11-23 DIAGNOSIS — M790 Rheumatism, unspecified: Secondary | ICD-10-CM | POA: Diagnosis not present

## 2014-11-24 DIAGNOSIS — M790 Rheumatism, unspecified: Secondary | ICD-10-CM | POA: Diagnosis not present

## 2014-11-25 DIAGNOSIS — M790 Rheumatism, unspecified: Secondary | ICD-10-CM | POA: Diagnosis not present

## 2014-11-26 DIAGNOSIS — M790 Rheumatism, unspecified: Secondary | ICD-10-CM | POA: Diagnosis not present

## 2014-11-30 ENCOUNTER — Encounter: Payer: Self-pay | Admitting: Family

## 2014-12-01 ENCOUNTER — Ambulatory Visit: Payer: Medicare Other | Admitting: Family

## 2014-12-01 ENCOUNTER — Other Ambulatory Visit (HOSPITAL_COMMUNITY): Payer: Medicare Other

## 2014-12-28 DIAGNOSIS — M790 Rheumatism, unspecified: Secondary | ICD-10-CM | POA: Diagnosis not present

## 2014-12-29 ENCOUNTER — Encounter: Payer: Self-pay | Admitting: Family

## 2014-12-29 DIAGNOSIS — M790 Rheumatism, unspecified: Secondary | ICD-10-CM | POA: Diagnosis not present

## 2014-12-29 DIAGNOSIS — G894 Chronic pain syndrome: Secondary | ICD-10-CM | POA: Diagnosis not present

## 2014-12-29 DIAGNOSIS — Z79899 Other long term (current) drug therapy: Secondary | ICD-10-CM | POA: Diagnosis not present

## 2014-12-29 DIAGNOSIS — G629 Polyneuropathy, unspecified: Secondary | ICD-10-CM | POA: Diagnosis not present

## 2014-12-29 DIAGNOSIS — M545 Low back pain: Secondary | ICD-10-CM | POA: Diagnosis not present

## 2014-12-29 DIAGNOSIS — M797 Fibromyalgia: Secondary | ICD-10-CM | POA: Diagnosis not present

## 2014-12-29 DIAGNOSIS — M1990 Unspecified osteoarthritis, unspecified site: Secondary | ICD-10-CM | POA: Diagnosis not present

## 2014-12-30 ENCOUNTER — Encounter: Payer: Self-pay | Admitting: Family

## 2014-12-30 ENCOUNTER — Ambulatory Visit (INDEPENDENT_AMBULATORY_CARE_PROVIDER_SITE_OTHER): Payer: Commercial Managed Care - HMO | Admitting: Family

## 2014-12-30 ENCOUNTER — Ambulatory Visit (HOSPITAL_COMMUNITY)
Admission: RE | Admit: 2014-12-30 | Discharge: 2014-12-30 | Disposition: A | Discharge status: Home / Self Care | Payer: Commercial Managed Care - HMO | Source: Ambulatory Visit | Attending: Family | Admitting: Family

## 2014-12-30 VITALS — BP 121/63 | HR 90 | Resp 16 | Ht 64.0 in | Wt 107.0 lb

## 2014-12-30 DIAGNOSIS — Z720 Tobacco use: Secondary | ICD-10-CM | POA: Insufficient documentation

## 2014-12-30 DIAGNOSIS — I6521 Occlusion and stenosis of right carotid artery: Secondary | ICD-10-CM

## 2014-12-30 DIAGNOSIS — F17200 Nicotine dependence, unspecified, uncomplicated: Secondary | ICD-10-CM

## 2014-12-30 DIAGNOSIS — I6523 Occlusion and stenosis of bilateral carotid arteries: Secondary | ICD-10-CM

## 2014-12-30 DIAGNOSIS — Z48812 Encounter for surgical aftercare following surgery on the circulatory system: Secondary | ICD-10-CM

## 2014-12-30 DIAGNOSIS — Z9889 Other specified postprocedural states: Secondary | ICD-10-CM | POA: Insufficient documentation

## 2014-12-30 DIAGNOSIS — M790 Rheumatism, unspecified: Secondary | ICD-10-CM | POA: Diagnosis not present

## 2014-12-30 NOTE — Patient Instructions (Signed)
Stroke Prevention Some medical conditions and behaviors are associated with an increased chance of having a stroke. You may prevent a stroke by making healthy choices and managing medical conditions. HOW CAN I REDUCE MY RISK OF HAVING A STROKE?   Stay physically active. Get at least 30 minutes of activity on most or all days.  Do not smoke. It may also be helpful to avoid exposure to secondhand smoke.  Limit alcohol use. Moderate alcohol use is considered to be:  No more than 2 drinks per day for men.  No more than 1 drink per day for nonpregnant women.  Eat healthy foods. This involves:  Eating 5 or more servings of fruits and vegetables a day.  Making dietary changes that address high blood pressure (hypertension), high cholesterol, diabetes, or obesity.  Manage your cholesterol levels.  Making food choices that are high in fiber and low in saturated fat, trans fat, and cholesterol may control cholesterol levels.  Take any prescribed medicines to control cholesterol as directed by your health care provider.  Manage your diabetes.  Controlling your carbohydrate and sugar intake is recommended to manage diabetes.  Take any prescribed medicines to control diabetes as directed by your health care provider.  Control your hypertension.  Making food choices that are low in salt (sodium), saturated fat, trans fat, and cholesterol is recommended to manage hypertension.  Take any prescribed medicines to control hypertension as directed by your health care provider.  Maintain a healthy weight.  Reducing calorie intake and making food choices that are low in sodium, saturated fat, trans fat, and cholesterol are recommended to manage weight.  Stop drug abuse.  Avoid taking birth control pills.  Talk to your health care provider about the risks of taking birth control pills if you are over 35 years old, smoke, get migraines, or have ever had a blood clot.  Get evaluated for sleep  disorders (sleep apnea).  Talk to your health care provider about getting a sleep evaluation if you snore a lot or have excessive sleepiness.  Take medicines only as directed by your health care provider.  For some people, aspirin or blood thinners (anticoagulants) are helpful in reducing the risk of forming abnormal blood clots that can lead to stroke. If you have the irregular heart rhythm of atrial fibrillation, you should be on a blood thinner unless there is a good reason you cannot take them.  Understand all your medicine instructions.  Make sure that other conditions (such as anemia or atherosclerosis) are addressed. SEEK IMMEDIATE MEDICAL CARE IF:   You have sudden weakness or numbness of the face, arm, or leg, especially on one side of the body.  Your face or eyelid droops to one side.  You have sudden confusion.  You have trouble speaking (aphasia) or understanding.  You have sudden trouble seeing in one or both eyes.  You have sudden trouble walking.  You have dizziness.  You have a loss of balance or coordination.  You have a sudden, severe headache with no known cause.  You have new chest pain or an irregular heartbeat. Any of these symptoms may represent a serious problem that is an emergency. Do not wait to see if the symptoms will go away. Get medical help at once. Call your local emergency services (911 in [place] Do not drive yourself to the hospital. Document Released: 11/22/2004 Document Revised: 03/01/2014 Document Reviewed: 04/17/2013 [employer] Patient Information 2015 [employer]. This information is not intended to replace advice given   to you by your health care provider. Make sure you discuss any questions you have with your health care provider.    Smoking Cessation Quitting smoking is important to your health and has many advantages. However, it is not always easy to quit since nicotine is a very addictive drug. Oftentimes, people try 3 times or  more before being able to quit. This document explains the best ways for you to prepare to quit smoking. Quitting takes hard work and a lot of effort, but you can do it. ADVANTAGES OF QUITTING SMOKING  You will live longer, feel better, and live better.  Your body will feel the impact of quitting smoking almost immediately.  Within 20 minutes, blood pressure decreases. Your pulse returns to its normal level.  After 8 hours, carbon monoxide levels in the blood return to normal. Your oxygen level increases.  After 24 hours, the chance of having a heart attack starts to decrease. Your breath, hair, and body stop smelling like smoke.  After 48 hours, damaged nerve endings begin to recover. Your sense of taste and smell improve.  After 72 hours, the body is virtually free of nicotine. Your bronchial tubes relax and breathing becomes easier.  After 2 to 12 weeks, lungs can hold more air. Exercise becomes easier and circulation improves.  The risk of having a heart attack, stroke, cancer, or lung disease is greatly reduced.  After 1 year, the risk of coronary heart disease is cut in half.  After 5 years, the risk of stroke falls to the same as a nonsmoker.  After 10 years, the risk of lung cancer is cut in half and the risk of other cancers decreases significantly.  After 15 years, the risk of coronary heart disease drops, usually to the level of a nonsmoker.  If you are pregnant, quitting smoking will improve your chances of having a healthy baby.  The people you live with, especially any children, will be healthier.  You will have extra money to spend on things other than cigarettes. QUESTIONS TO THINK ABOUT BEFORE ATTEMPTING TO QUIT You may want to talk about your answers with your health care provider.  Why do you want to quit?  If you tried to quit in the past, what helped and what did not?  What will be the most difficult situations for you after you quit? How will you plan to  handle them?  Who can help you through the tough times? Your family? Friends? A health care provider?  What pleasures do you get from smoking? What ways can you still get pleasure if you quit? Here are some questions to ask your health care provider:  How can you help me to be successful at quitting?  What medicine do you think would be best for me and how should I take it?  What should I do if I need more help?  What is smoking withdrawal like? How can I get information on withdrawal? GET READY  Set a quit date.  Change your environment by getting rid of all cigarettes, ashtrays, matches, and lighters in your home, car, or work. Do not let people smoke in your home.  Review your past attempts to quit. Think about what worked and what did not. GET SUPPORT AND ENCOURAGEMENT You have a better chance of being successful if you have help. You can get support in many ways.  Tell your family, friends, and coworkers that you are going to quit and need their support. Ask them   not to smoke around you.  Get individual, group, or telephone counseling and support. Programs are available at local hospitals and health centers. Call your local health department for information about programs in your area.  Spiritual beliefs and practices may help some smokers quit.  Download a "quit meter" on your computer to keep track of quit statistics, such as how long you have gone without smoking, cigarettes not smoked, and money saved.  Get a self-help book about quitting smoking and staying off tobacco. LEARN NEW SKILLS AND BEHAVIORS  Distract yourself from urges to smoke. Talk to someone, go for a walk, or occupy your time with a task.  Change your normal routine. Take a different route to work. Drink tea instead of coffee. Eat breakfast in a different place.  Reduce your stress. Take a hot bath, exercise, or read a book.  Plan something enjoyable to do every day. Reward yourself for not  smoking.  Explore interactive web-based programs that specialize in helping you quit. GET MEDICINE AND USE IT CORRECTLY Medicines can help you stop smoking and decrease the urge to smoke. Combining medicine with the above behavioral methods and support can greatly increase your chances of successfully quitting smoking.  Nicotine replacement therapy helps deliver nicotine to your body without the negative effects and risks of smoking. Nicotine replacement therapy includes nicotine gum, lozenges, inhalers, nasal sprays, and skin patches. Some may be available over-the-counter and others require a prescription.  Antidepressant medicine helps people abstain from smoking, but how this works is unknown. This medicine is available by prescription.  Nicotinic receptor partial agonist medicine simulates the effect of nicotine in your brain. This medicine is available by prescription. Ask your health care provider for advice about which medicines to use and how to use them based on your health history. Your health care provider will tell you what side effects to look out for if you choose to be on a medicine or therapy. Carefully read the information on the package. Do not use any other product containing nicotine while using a nicotine replacement product.  RELAPSE OR DIFFICULT SITUATIONS Most relapses occur within the first 3 months after quitting. Do not be discouraged if you start smoking again. Remember, most people try several times before finally quitting. You may have symptoms of withdrawal because your body is used to nicotine. You may crave cigarettes, be irritable, feel very hungry, cough often, get headaches, or have difficulty concentrating. The withdrawal symptoms are only temporary. They are strongest when you first quit, but they will go away within 10-14 days. To reduce the chances of relapse, try to:  Avoid drinking alcohol. Drinking lowers your chances of successfully quitting.  Reduce the  amount of caffeine you consume. Once you quit smoking, the amount of caffeine in your body increases and can give you symptoms, such as a rapid heartbeat, sweating, and anxiety.  Avoid smokers because they can make you want to smoke.  Do not let weight gain distract you. Many smokers will gain weight when they quit, usually less than 10 pounds. Eat a healthy diet and stay active. You can always lose the weight gained after you quit.  Find ways to improve your mood other than smoking. FOR MORE INFORMATION  www.smokefree.gov  Document Released: 10/09/2001 Document Revised: 03/01/2014 Document Reviewed: 01/24/2012 [employer] Patient Information 2015 [employer]. This information is not intended to replace advice given to you by your health care provider. Make sure you discuss any questions you have with your health   care provider.    Smoking Cessation, Tips for Success If you are ready to quit smoking, congratulations! You have chosen to help yourself be healthier. Cigarettes bring nicotine, tar, carbon monoxide, and other irritants into your body. Your lungs, heart, and blood vessels will be able to work better without these poisons. There are many different ways to quit smoking. Nicotine gum, nicotine patches, a nicotine inhaler, or nicotine nasal spray can help with physical craving. Hypnosis, support groups, and medicines help break the habit of smoking. WHAT THINGS CAN I DO TO MAKE QUITTING EASIER?  Here are some tips to help you quit for good:  Pick a date when you will quit smoking completely. Tell all of your friends and family about your plan to quit on that date.  Do not try to slowly cut down on the number of cigarettes you are smoking. Pick a quit date and quit smoking completely starting on that day.  Throw away all cigarettes.   Clean and remove all ashtrays from your home, work, and car.  On a card, write down your reasons for quitting. Carry the card with you and read it  when you get the urge to smoke.  Cleanse your body of nicotine. Drink enough water and fluids to keep your urine clear or pale yellow. Do this after quitting to flush the nicotine from your body.  Learn to predict your moods. Do not let a bad situation be your excuse to have a cigarette. Some situations in your life might tempt you into wanting a cigarette.  Never have "just one" cigarette. It leads to wanting another and another. Remind yourself of your decision to quit.  Change habits associated with smoking. If you smoked while driving or when feeling stressed, try other activities to replace smoking. Stand up when drinking your coffee. Brush your teeth after eating. Sit in a different chair when you read the paper. Avoid alcohol while trying to quit, and try to drink fewer caffeinated beverages. Alcohol and caffeine may urge you to smoke.  Avoid foods and drinks that can trigger a desire to smoke, such as sugary or spicy foods and alcohol.  Ask people who smoke not to smoke around you.  Have something planned to do right after eating or having a cup of coffee. For example, plan to take a walk or exercise.  Try a relaxation exercise to calm you down and decrease your stress. Remember, you may be tense and nervous for the first 2 weeks after you quit, but this will pass.  Find new activities to keep your hands busy. Play with a pen, coin, or rubber band. Doodle or draw things on paper.  Brush your teeth right after eating. This will help cut down on the craving for the taste of tobacco after meals. You can also try mouthwash.   Use oral substitutes in place of cigarettes. Try using lemon drops, carrots, cinnamon sticks, or chewing gum. Keep them handy so they are available when you have the urge to smoke.  When you have the urge to smoke, try deep breathing.  Designate your home as a nonsmoking area.  If you are a heavy smoker, ask your health care provider about a prescription for  nicotine chewing gum. It can ease your withdrawal from nicotine.  Reward yourself. Set aside the cigarette money you save and buy yourself something nice.  Look for support from others. Join a support group or smoking cessation program. Ask someone at home or at work   to help you with your plan to quit smoking.  Always ask yourself, "Do I need this cigarette or is this just a reflex?" Tell yourself, "Today, I choose not to smoke," or "I do not want to smoke." You are reminding yourself of your decision to quit.  Do not replace cigarette smoking with electronic cigarettes (commonly called e-cigarettes). The safety of e-cigarettes is unknown, and some may contain harmful chemicals.  If you relapse, do not give up! Plan ahead and think about what you will do the next time you get the urge to smoke. HOW WILL I FEEL WHEN I QUIT SMOKING? You may have symptoms of withdrawal because your body is used to nicotine (the addictive substance in cigarettes). You may crave cigarettes, be irritable, feel very hungry, cough often, get headaches, or have difficulty concentrating. The withdrawal symptoms are only temporary. They are strongest when you first quit but will go away within 10-14 days. When withdrawal symptoms occur, stay in control. Think about your reasons for quitting. Remind yourself that these are signs that your body is healing and getting used to being without cigarettes. Remember that withdrawal symptoms are easier to treat than the major diseases that smoking can cause.  Even after the withdrawal is over, expect periodic urges to smoke. However, these cravings are generally short lived and will go away whether you smoke or not. Do not smoke! WHAT RESOURCES ARE AVAILABLE TO HELP ME QUIT SMOKING? Your health care provider can direct you to community resources or hospitals for support, which may include:  Group support.  Education.  Hypnosis.  Therapy. Document Released: 07/13/2004 Document  Revised: 03/01/2014 Document Reviewed: 04/02/2013 [employer] Patient Information 2015 [employer]. This information is not intended to replace advice given to you by your health care provider. Make sure you discuss any questions you have with your health care provider.  

## 2014-12-30 NOTE — Progress Notes (Signed)
Established Carotid Patient   History of Present Illness  [name] is a 69 y.o. female patient of Dr. Scot Dock who underwent a left carotid endarterectomy in September of 2011.  She returns today for routine surveillance.  She has a history of partial gastrectomy for gastric ulcer.  Patient has Negative history of TIA or stroke symptom. The patient denies amaurosis fugax or monocular blindness. The patient denies facial drooping.  Pt. denies hemiplegia. The patient denies receptive or expressive aphasia.  Pt has L4-5 "bad discs", and stenosis, states chronic low back pain with intermittent radiculopathy in both legs.  Pt reports New Medical or Surgical History: Has swelling in both eyelids, left is worse than right, was seen by her opthalmologist for this. Diagnosed with Parkinson's Disease.  Pt reports neuropathy in feet, painful to touch, does not know if this is from her back issues.  She has chronic recurring headaches, states she has not been to see her neurologist lately and needs to do so; she raises her head when she is spoken to, but otherwise her head drifts downward which pt attributes to headaches, she denies that she is tired. She unintentionally lost 16 pounds since her July 2015 visit, denies post prandial abdominal pain.   Pt Diabetic: No, states she previously possibly had DM, has occasional hypoglycemia  Pt smoker: smoker (1-2 ppd x 50 yrs)  Pt meds include:  Statin : Yes  ASA: Yes  Other anticoagulants/antiplatelets: no    Past Medical History  Diagnosis Date  . Asthma   . HTN (hypertension)   . Grave's disease   . Fibromyalgia   . Thrombophlebitis   . Depression   . Angina   . Shortness of breath   . COPD (chronic obstructive pulmonary disease)   . Sleep apnea   . Blood dyscrasia     hx of thrombphlebitis  . Recurrent upper respiratory infection (URI)   . GERD (gastroesophageal reflux disease)   . Headache(784.0)   . Arthritis   . CAD  (coronary artery disease)   . Chronic kidney disease   . Peripheral vascular disease   . Diabetes mellitus   . Ulcer   . Diarrhea     chronic   . Thyroid disorder   . Anxiety and depression   . Chronic pain     leg and feet  . DDD (degenerative disc disease)   . Rhinitis   . OP (osteoporosis)   . Vitamin B 12 deficiency   . PUD (peptic ulcer disease)   . DDD (degenerative disc disease)   . Spinal stenosis   . Spinal stenosis of lumbar region 04/23/2013  . Tobacco use disorder 04/23/2013  . DVT (deep venous thrombosis)     Social History History  Substance Use Topics  . Smoking status: Current Every Day Smoker -- 2.00 packs/day for 50 years    Types: Cigarettes  . Smokeless tobacco: Never Used     Comment: cessation info given and reviewed  . Alcohol Use: No    Family History Family History  Problem Relation Age of Onset  . Diabetes Mother   . Hyperlipidemia Mother   . Heart attack Mother   . Other Mother     varicose veins,respiratory,stroke  . Heart disease Mother     before age 35  . Hypertension Mother   . Varicose Veins Mother   . Diabetes Father   . Heart disease Father     before age 49  . Hyperlipidemia Father   .  Heart attack Father   . Other Father     varicose veins  . Hypertension Father   . Varicose Veins Father   . Diabetes Sister   . Heart disease Sister     before age 31  . Hyperlipidemia Sister   . Heart attack Sister   . Other Sister     varicose veins  . Hypertension Sister   . Varicose Veins Sister   . Peripheral vascular disease Sister   . Diabetes Sister   . Hyperlipidemia Sister   . Hypertension Sister   . Varicose Veins Sister     Surgical History Past Surgical History  Procedure Laterality Date  . Gastrectomy      age 45   Part of small intestin and part of stomach  . Cholecystectomy  1999  . Carotid endarterectomy Left Sept. 20,2011    cea  . Left heart catheterization with coronary angiogram N/A 10/02/2011     Procedure: LEFT HEART CATHETERIZATION WITH CORONARY ANGIOGRAM;  Surgeon: Sueanne Margarita, MD;  Location: [facility];  Service: Cardiovascular;  Laterality: N/A;    Allergies  Allergen Reactions  . Clarithromycin Nausea And Vomiting and Nausea Only    Current Outpatient Prescriptions  Medication Sig Dispense Refill  . albuterol-ipratropium (COMBIVENT) 18-103 MCG/ACT inhaler Inhale 2 puffs into the lungs every 6 (six) hours as needed. For shortness of breath     . amLODipine (NORVASC) 5 MG tablet Take 5 mg by mouth daily.      [name] aspirin 81 MG tablet Take 81 mg by mouth daily.      . calcium acetate (PHOSLO) 667 MG capsule Take 667 mg by mouth 3 (three) times daily with meals.     . Chlorpheniramine-Acetaminophen (CORICIDIN HBP COLD/FLU PO) Take 1 tablet by mouth 2 (two) times daily.    . clonazePAM (KLONOPIN) 1 MG tablet Take 1 mg by mouth 2 (two) times daily as needed. For anxiety    . dicyclomine (BENTYL) 20 MG tablet Take 20 mg by mouth every 6 (six) hours.     . DULoxetine (CYMBALTA) 60 MG capsule Take 60 mg by mouth daily.      . fentaNYL (DURAGESIC - DOSED MCG/HR) 75 MCG/HR Place 1 patch onto the skin every other day.     . fluticasone (FLONASE) 50 MCG/ACT nasal spray Place 2 sprays into the nose at bedtime.    [name] guaiFENesin (MUCINEX) 600 MG 12 hr tablet Take 600 mg by mouth 2 (two) times daily.    . hyoscyamine (LEVSIN SL) 0.125 MG SL tablet Place 0.125 mg under the tongue every 4 (four) hours as needed. Before meals as needed    . metoCLOPramide (REGLAN) 5 MG tablet Take 5 mg by mouth 4 (four) times daily. Take 1 tablet 30 minutes before meals and at bedtime     . moxifloxacin (VIGAMOX) 0.5 % ophthalmic solution Place 1 drop into both eyes 2 (two) times daily.      . nitroGLYCERIN (NITROSTAT) 0.4 MG SL tablet Place 0.4 mg under the tongue every 5 (five) minutes as needed. For chest pain     . oxyCODONE (OXY IR/ROXICODONE) 5 MG immediate release tablet Take 5 mg by mouth every 4 (four)  hours as needed. For pain     . pantoprazole (PROTONIX) 40 MG tablet Take 40 mg by mouth 2 (two) times daily.     . predniSONE (DELTASONE) 5 MG tablet Take 5 mg by mouth every other day.      [name]  Prenatal Vit-Fe Fumarate-FA (MULTIVITAMIN-PRENATAL) 27-0.8 MG TABS Take 1 tablet by mouth daily.      . simvastatin (ZOCOR) 10 MG tablet Take 10 mg by mouth at bedtime.      . Vitamin D, Ergocalciferol, (DRISDOL) 50000 UNITS CAPS Take 50,000 Units by mouth every 7 (seven) days. Every saturday     No current facility-administered medications for this visit.    Review of Systems : See HPI for pertinent positives and negatives.  Physical Examination  Filed Vitals:   12/30/14 1322 12/30/14 1325  BP: 116/68 121/63  Pulse: 92 90  Resp:  16  Height:  5\' 4"  (1.626 m)  Weight:  107 lb (48.535 kg)  SpO2:  95%   Body mass index is 18.36 kg/(m^2).   General: WDWN female in NAD  GAIT: antalgic, using cane Eyes: PERRLA, bilateral exophthalmos (history of Grave's Disease), upper and lower eyelids are swollen, left more so than right, can open her lids  Pulmonary: CTAB, Negative Rales, Negative rhonchi, & Negative wheezing.  Cardiac: regular Rhythm , Negative detected Murmurs.   VASCULAR EXAM  Carotid Bruits  Left  Right    Negative  Negative   Aorta is not palpable.  Radial pulses are 2+ palpable and equal.  LE Pulses  LEFT  RIGHT   POSTERIOR TIBIAL  palpable  not palpable   DORSALIS PEDIS  ANTERIOR TIBIAL  not palpable  palpable   Gastrointestinal: soft, nontender, BS WNL, no r/g, no palpable masses.  Musculoskeletal: Negative muscle atrophy/wasting. M/S 3/5 in UE's, 2/5 in LE's, Extremities without ischemic changes.  Neurologic: A&O X 3; Appropriate Affect, Speech is normal  CN 2-12 intact except is slightly hard of hearing, Pain and light touch intact in extremities except has pain to touch in feet, Motor exam as listed above.         Non-Invasive Vascular  Imaging CAROTID DUPLEX 12/30/2014   CEREBROVASCULAR DUPLEX EVALUATION    INDICATION: Carotid stenosis    PREVIOUS INTERVENTION(S): Left carotid endarterectomy performed 07/18/2010.    DUPLEX EXAM:     RIGHT  LEFT  Peak Systolic Velocities (cm/s) End Diastolic Velocities (cm/s) Plaque LOCATION Peak Systolic Velocities (cm/s) End Diastolic Velocities (cm/s) Plaque  91 15  CCA PROXIMAL 101 25   86 23  CCA MID 108 28 HT  84 22 HT CCA DISTAL 115 11   340 45 HT ECA 131 16 HM  332 94 HT ICA PROXIMAL 124 41 HM  143 41  ICA MID 137 46   74 28  ICA DISTAL 85 24     3.9 ICA / CCA Ratio (PSV) carotid endarterectomy  Antegrade Vertebral Flow Antegrade  916 Brachial Systolic Pressure (mmHg) 384  Triphasic Brachial Artery Waveforms Triphasic    Plaque Morphology:  HM = Homogeneous, HT = Heterogeneous, CP = Calcific Plaque, SP = Smooth Plaque, IP = Irregular Plaque  ADDITIONAL FINDINGS:     IMPRESSION: Right internal carotid artery stenosis present in the 60%-79% range. Left internal carotid artery is patent with history of carotid endarterectomy, mild hyperplasia present involving the mid/distal patch with elevated velocities suggestive of 40%-59% restenosis. Right external carotid artery stenosis present.    Compared to the previous exam:  Increase in disease bilaterally since previous study on 05/26/2014.     Assessment: [name] is a 69 y.o. female who is s/p  left carotid endarterectomy in September of 2011.  Today's carotid Duplex reveals 60%-79% right ICA stenosis. Left internal carotid artery is patent with history  of carotid endarterectomy, mild hyperplasia present involving the mid/distal patch with elevated velocities suggestive of 40%-59% restenosis. Increase in disease bilaterally since previous study on 05/26/2014.  I discussed pt's statement with Dr. Oneida Alar that she might have had a mini stroke since she is leaning to the right in the last couple of months; she denies  vision changes, denies hemiparesis symptoms, denies change in speech. These symptoms are not lateralizing, not typical of TIA; will defer to neurological evaluation by Dr. Maureen Chatters.  Unfortunately she continues to smoke, see Plan.  Face to face time with patient was 25 minutes. Over 50% of this time was spent on counseling and coordination of care.   Plan: Follow-up in 6 months with Carotid Duplex and see me on a day that Dr. Scot Dock is in clinic.  Follow up with Dr. Velia Meyer re pt's headaches and leaning to the right for the last couple of months.  The patient was counseled re smoking cessation and given several free resources re smoking cessation.   I discussed in depth with the patient the nature of atherosclerosis, and emphasized the importance of maximal medical management including strict control of blood pressure, blood glucose, and lipid levels, obtaining regular exercise, and cessation of smoking.  The patient is aware that without maximal medical management the underlying atherosclerotic disease process will progress, limiting the benefit of any interventions. The patient was given information about stroke prevention and what symptoms should prompt the patient to seek immediate medical care. Thank you for allowing [place] to participate in this patient's care.  Clemon Chambers, RN, MSN, FNP-C [facility] Office: [facility] Physician: Oneida Alar  12/30/2014 12:54 PM

## 2014-12-31 DIAGNOSIS — M790 Rheumatism, unspecified: Secondary | ICD-10-CM | POA: Diagnosis not present

## 2015-01-03 DIAGNOSIS — M790 Rheumatism, unspecified: Secondary | ICD-10-CM | POA: Diagnosis not present

## 2015-01-04 DIAGNOSIS — M790 Rheumatism, unspecified: Secondary | ICD-10-CM | POA: Diagnosis not present

## 2015-01-05 DIAGNOSIS — M790 Rheumatism, unspecified: Secondary | ICD-10-CM | POA: Diagnosis not present

## 2015-01-06 ENCOUNTER — Other Ambulatory Visit: Payer: Self-pay | Admitting: Gastroenterology

## 2015-01-06 DIAGNOSIS — R634 Abnormal weight loss: Secondary | ICD-10-CM

## 2015-01-06 DIAGNOSIS — K921 Melena: Secondary | ICD-10-CM | POA: Diagnosis not present

## 2015-01-06 DIAGNOSIS — R112 Nausea with vomiting, unspecified: Secondary | ICD-10-CM | POA: Diagnosis not present

## 2015-01-06 DIAGNOSIS — R1084 Generalized abdominal pain: Secondary | ICD-10-CM

## 2015-01-06 DIAGNOSIS — M790 Rheumatism, unspecified: Secondary | ICD-10-CM | POA: Diagnosis not present

## 2015-01-06 DIAGNOSIS — A09 Infectious gastroenteritis and colitis, unspecified: Secondary | ICD-10-CM | POA: Diagnosis not present

## 2015-01-07 DIAGNOSIS — M790 Rheumatism, unspecified: Secondary | ICD-10-CM | POA: Diagnosis not present

## 2015-01-10 DIAGNOSIS — M790 Rheumatism, unspecified: Secondary | ICD-10-CM | POA: Diagnosis not present

## 2015-01-10 DIAGNOSIS — R634 Abnormal weight loss: Secondary | ICD-10-CM | POA: Diagnosis not present

## 2015-01-11 ENCOUNTER — Ambulatory Visit
Admission: RE | Admit: 2015-01-11 | Discharge: 2015-01-11 | Disposition: A | Discharge status: Home / Self Care | Payer: Commercial Managed Care - HMO | Source: Ambulatory Visit | Attending: Gastroenterology | Admitting: Gastroenterology

## 2015-01-11 DIAGNOSIS — K838 Other specified diseases of biliary tract: Secondary | ICD-10-CM | POA: Diagnosis not present

## 2015-01-11 DIAGNOSIS — R1084 Generalized abdominal pain: Secondary | ICD-10-CM

## 2015-01-11 DIAGNOSIS — R110 Nausea: Secondary | ICD-10-CM | POA: Diagnosis not present

## 2015-01-11 DIAGNOSIS — K5900 Constipation, unspecified: Secondary | ICD-10-CM | POA: Diagnosis not present

## 2015-01-11 DIAGNOSIS — R634 Abnormal weight loss: Secondary | ICD-10-CM | POA: Diagnosis not present

## 2015-01-11 DIAGNOSIS — M790 Rheumatism, unspecified: Secondary | ICD-10-CM | POA: Diagnosis not present

## 2015-01-11 MED ORDER — IOPAMIDOL (ISOVUE-300) INJECTION 61%
80.0000 mL | Freq: Once | INTRAVENOUS | Status: AC | PRN
  Administered 2015-01-11: 80 mL via INTRAVENOUS

## 2015-01-12 DIAGNOSIS — M790 Rheumatism, unspecified: Secondary | ICD-10-CM | POA: Diagnosis not present

## 2015-01-13 DIAGNOSIS — M790 Rheumatism, unspecified: Secondary | ICD-10-CM | POA: Diagnosis not present

## 2015-01-14 DIAGNOSIS — M790 Rheumatism, unspecified: Secondary | ICD-10-CM | POA: Diagnosis not present

## 2015-01-17 DIAGNOSIS — M790 Rheumatism, unspecified: Secondary | ICD-10-CM | POA: Diagnosis not present

## 2015-01-18 DIAGNOSIS — M790 Rheumatism, unspecified: Secondary | ICD-10-CM | POA: Diagnosis not present

## 2015-01-19 DIAGNOSIS — M790 Rheumatism, unspecified: Secondary | ICD-10-CM | POA: Diagnosis not present

## 2015-01-20 ENCOUNTER — Encounter: Payer: Self-pay | Admitting: Neurology

## 2015-01-20 ENCOUNTER — Ambulatory Visit (INDEPENDENT_AMBULATORY_CARE_PROVIDER_SITE_OTHER): Payer: Commercial Managed Care - HMO | Admitting: Neurology

## 2015-01-20 VITALS — BP 136/78 | HR 87 | Resp 15 | Ht 64.0 in | Wt 98.0 lb

## 2015-01-20 DIAGNOSIS — M4806 Spinal stenosis, lumbar region: Secondary | ICD-10-CM | POA: Diagnosis not present

## 2015-01-20 DIAGNOSIS — F1910 Other psychoactive substance abuse, uncomplicated: Secondary | ICD-10-CM

## 2015-01-20 DIAGNOSIS — R64 Cachexia: Secondary | ICD-10-CM

## 2015-01-20 DIAGNOSIS — G212 Secondary parkinsonism due to other external agents: Secondary | ICD-10-CM

## 2015-01-20 DIAGNOSIS — R269 Unspecified abnormalities of gait and mobility: Secondary | ICD-10-CM | POA: Diagnosis not present

## 2015-01-20 DIAGNOSIS — IMO0001 Reserved for inherently not codable concepts without codable children: Secondary | ICD-10-CM

## 2015-01-20 DIAGNOSIS — M790 Rheumatism, unspecified: Secondary | ICD-10-CM | POA: Diagnosis not present

## 2015-01-20 DIAGNOSIS — Z720 Tobacco use: Secondary | ICD-10-CM

## 2015-01-20 DIAGNOSIS — H8143 Vertigo of central origin, bilateral: Secondary | ICD-10-CM

## 2015-01-20 DIAGNOSIS — M48062 Spinal stenosis, lumbar region with neurogenic claudication: Secondary | ICD-10-CM

## 2015-01-20 DIAGNOSIS — G252 Other specified forms of tremor: Secondary | ICD-10-CM

## 2015-01-20 NOTE — Patient Instructions (Addendum)
Fall Prevention in Hospitals As a hospital patient, your condition and the treatments you receive can increase your risk for falls. Some additional risk factors for falls in a hospital include:  Being in an unfamiliar environment.  Being on bed rest.  Your surgery.  Taking certain medicines.  Your tubing requirements, such as intravenous (IV) therapy or catheters. It is important that you learn how to decrease fall risks while at the hospital. Below are important tips that can help prevent falls. SAFETY TIPS FOR PREVENTING FALLS Talk about your risk of falling.  Ask your caregiver why you are at risk for falling. Is it your medicine, illness, tubing placement, or something else?  Make a plan with your caregiver to keep you safe from falls.  Ask your caregiver or pharmacist about side effect of your medicines. Some medicines can make you dizzy or affect your coordination. Ask for help.  Ask for help before getting out of bed. You may need to press your call button.  Ask for assistance in getting you safely to the toilet.  Ask for a walker or cane to be put at your bedside. Ask that most of the side rails on your bed be placed up before your caregiver leaves the room.  Ask family or friends to sit with you.  Ask for things that are out of your reach, such as your glasses, hearing aids, telephone, bedside table, or call button. Follow these tips to avoid falling:  Stay lying or seated, rather than standing, while waiting for help.  Wear rubber-soled slippers or shoes whenever you walk in the hospital.  Avoid quick, sudden movements.  Change positions slowly.  Sit on the side of your bed before standing.  Stand up slowly and wait before you start to walk.  Let your caregiver know if there is a spill on the floor.  Pay careful attention to the medical equipment, electrical cords, and tubes around you.  When you need help, use your call button by your bed or in the  bathroom. Wait for one of your caregivers to help you.  If you feel dizzy or unsure of your footing, return to bed and wait for assistance.  Avoid being distracted by the TV, telephone, or another person in your room.  Do not lean or support yourself on rolling objects, such as IV poles or bedside tables. Document Released: 10/12/2000 Document Revised: 10/01/2012 Document Reviewed: 06/22/2012 [facility] Patient Information 2015 [place], [place]. This information is not intended to replace advice given to you by your health care provider. Make sure you discuss any questions you have with your health care provider. Fibromyalgia Fibromyalgia is a disorder that is often misunderstood. It is associated with muscular pains and tenderness that comes and goes. It is often associated with fatigue and sleep disturbances. Though it tends to be long-lasting, fibromyalgia is not life-threatening. CAUSES  The exact cause of fibromyalgia is unknown. People with certain gene types are predisposed to developing fibromyalgia and other conditions. Certain factors can play a role as triggers, such as:  Spine disorders.  Arthritis.  Severe injury (trauma) and other physical stressors.  Emotional stressors. SYMPTOMS   The main symptom is pain and stiffness in the muscles and joints, which can vary over time.  Sleep and fatigue problems. Other related symptoms may include:  Bowel and bladder problems.  Headaches.  Visual problems.  Problems with odors and noises.  Depression or mood changes.  Painful periods (dysmenorrhea).  Dryness of the skin or eyes.  DIAGNOSIS  There are no specific tests for diagnosing fibromyalgia. Patients can be diagnosed accurately from the specific symptoms they have. The diagnosis is made by determining that nothing else is causing the problems. TREATMENT  There is no cure. Management includes medicines and an active, healthy lifestyle. The goal is to enhance physical  fitness, decrease pain, and improve sleep. HOME CARE INSTRUCTIONS   Only take over-the-counter or prescription medicines as directed by your caregiver. Sleeping pills, tranquilizers, and pain medicines may make your problems worse.  Low-impact aerobic exercise is very important and advised for treatment. At first, it may seem to make pain worse. Gradually increasing your tolerance will overcome this feeling.  Learning relaxation techniques and how to control stress will help you. Biofeedback, visual imagery, hypnosis, muscle relaxation, yoga, and meditation are all options.  Anti-inflammatory medicines and physical therapy may provide short-term help.  Acupuncture or massage treatments may help.  Take muscle relaxant medicines as suggested by your caregiver.  Avoid stressful situations.  Plan a healthy lifestyle. This includes your diet, sleep, rest, exercise, and friends.  Find and practice a hobby you enjoy.  Join a fibromyalgia support group for interaction, ideas, and sharing advice. This may be helpful. SEEK MEDICAL CARE IF:  You are not having good results or improvement from your treatment. FOR MORE INFORMATION  National Fibromyalgia Association: www.fmaware.[place]: www.arthritis.org Document Released: 10/15/2005 Document Revised: 01/07/2012 Document Reviewed: 01/25/2010 [facility] Patient Information 2015 [place], [place]. This information is not intended to replace advice given to you by your health care provider. Make sure you discuss any questions you have with your health care provider.

## 2015-01-20 NOTE — Progress Notes (Signed)
[facility]  Provider:  Dr Alveta Quintela Referring Provider: Thressa Sheller, MD Primary Care Physician:  Thressa Sheller, MD    HPI:  [name] is a 69 y.o. female here as a referral from Dr. Noah Delaine for evaluation of her gait disorder, dizziness and frequent falls.    01-20-15, [name] returns upon referral by Dr. Noah Delaine. Referral reason is dizziness. The patient is having dizziness and frequent falls to be evaluated for possible syncope and gait disturbance.  The patient further reports that she has lost continuously weight as I noticed. She has a significant secondary parkinsonian tremor. And she is in the midst of a  detailed worked up for malignancy but none has been found. She is still a smoker and has been trying for many years to stop. So far unsuccessful. She states that she had has felt generalized weak and week her. Her serum glucose has at times been very high, and she continues to need to take Reglan for the diabetic gastroparesis. The patient reports feeling swimmy headed and when she turns suddenly sometimes she will fall . She is still using the cane that she first used in 2011 and in 2014 . She appears cachectic, she  trembles and she smells of smoke. Bilateral proptosis of the eye, titubation.  She feels dizzy every day , but not throughout the day. The dizziness does not seem to be correlating to the time of day or 2 her mealtimes. She feels that it is not related to any intake. When she falls she falls flat on her face she reports. She has bruising on the nose and forehead. There is no warning.        Last consult. CD ; The patient was in 2014 referred for a right dominant tremor. Before that, she had  been seen in 2011 for neuropathy workup, . She presented  In 2014 with tremor, truncal tremor , vocal tremor and trembling jaw.  She walks  Still with a cane that she had 4 years ago , still not having followed up on the ordered  PT evaluation   (for a walker ) initiated 2011 - she didn't follow up.  It  helped her with ambulation when she leans onto the shopping cart in the supermarket, reports she also has very diffuse myalgia, soreness , hip and knee and back pain.   She had tried restless leg medication in 2010 , begun on Mirapex but this made her too drowsy. No other RLS medication since.  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 The  patient had an a gastrectomy at age 17 and small intestine  removed needed Reglan to have normal peristaltic a somewhat normal peristaltic.  After the patient  had last been seen the patient on 07/18/2010, she had a left-sided corded endarterectomy.   She continues to smoke to this day,   This day the patient relies on the promethazine for peristaltic support , and continues to tremble.   She also has a history of diabetes and diabetic peripheral neuropathy which canceled it contributes to restless leg syndrome tremor and gait disorder.  She has degenerative.joint disease was chronic back pain and spinal stenosis at the lumbar spine.    Review of Systems: Out of a complete 14 system review, the patient complains of only the following symptoms, and all other reviewed systems are negative.  anorexia, tremor,  Gait disorder, lumbar stenosis pain.  Falls, dizziness.   History   Social  History  . Marital Status: Widowed    Spouse Name: N/A  . Number of Children: 1  . Years of Education: N/A   Occupational History  . Not on file.   Social History Main Topics  . Smoking status: Current Every Day Smoker -- 2.00 packs/day for 50 years    Types: Cigarettes  . Smokeless tobacco: Never Used     Comment: cessation info given and reviewed  . Alcohol Use: No  . Drug Use: No  . Sexual Activity: No   Other Topics Concern  . Not on file   Social History Narrative    Family History  Problem Relation Age of Onset  . Diabetes Mother   .  Hyperlipidemia Mother   . Heart attack Mother   . Other Mother     varicose veins,respiratory,stroke  . Heart disease Mother     before age 62  . Hypertension Mother   . Varicose Veins Mother   . Diabetes Father   . Heart disease Father     before age 54  . Hyperlipidemia Father   . Heart attack Father   . Other Father     varicose veins  . Hypertension Father   . Varicose Veins Father   . Diabetes Sister   . Heart disease Sister     before age 83  . Hyperlipidemia Sister   . Heart attack Sister   . Other Sister     varicose veins  . Hypertension Sister   . Varicose Veins Sister   . Peripheral vascular disease Sister   . Diabetes Sister   . Hyperlipidemia Sister   . Hypertension Sister   . Varicose Veins Sister     Past Medical History  Diagnosis Date  . Asthma   . HTN (hypertension)   . Grave's disease   . Fibromyalgia   . Thrombophlebitis   . Depression   . Angina   . Shortness of breath   . COPD (chronic obstructive pulmonary disease)   . Sleep apnea   . Blood dyscrasia     hx of thrombphlebitis  . Recurrent upper respiratory infection (URI)   . GERD (gastroesophageal reflux disease)   . Headache(784.0)   . Arthritis   . CAD (coronary artery disease)   . Chronic kidney disease   . Peripheral vascular disease   . Diabetes mellitus   . Ulcer   . Diarrhea     chronic   . Thyroid disorder   . Anxiety and depression   . Chronic pain     leg and feet  . DDD (degenerative disc disease)   . Rhinitis   . OP (osteoporosis)   . Vitamin B 12 deficiency   . PUD (peptic ulcer disease)   . DDD (degenerative disc disease)   . Spinal stenosis   . Spinal stenosis of lumbar region 04/23/2013  . Tobacco use disorder 04/23/2013  . DVT (deep venous thrombosis)   . Carotid artery occlusion     Past Surgical History  Procedure Laterality Date  . Gastrectomy      age 48   Part of small intestin and part of stomach  . Cholecystectomy  1999  . Carotid  endarterectomy Left Sept. 20,2011    cea  . Left heart catheterization with coronary angiogram N/A 10/02/2011    Procedure: LEFT HEART CATHETERIZATION WITH CORONARY ANGIOGRAM;  Surgeon: Sueanne Margarita, MD;  Location: [place] [facility];  Service: Cardiovascular;  Laterality: N/A;    Current Outpatient  Prescriptions  Medication Sig Dispense Refill  . albuterol-ipratropium (COMBIVENT) 18-103 MCG/ACT inhaler Inhale 2 puffs into the lungs every 6 (six) hours as needed. For shortness of breath     . amLODipine (NORVASC) 5 MG tablet Take 5 mg by mouth daily.      [name] aspirin 81 MG tablet Take 81 mg by mouth daily.      . calcium acetate (PHOSLO) 667 MG capsule Take 667 mg by mouth daily.     . Chlorpheniramine-Acetaminophen (CORICIDIN HBP COLD/FLU PO) Take 1 tablet by mouth 2 (two) times daily.    . clonazePAM (KLONOPIN) 1 MG tablet Take 1 mg by mouth 2 (two) times daily as needed. For anxiety    . cyclobenzaprine (FLEXERIL) 5 MG tablet Take 5 mg by mouth as needed.     . dicyclomine (BENTYL) 20 MG tablet Take 20 mg by mouth every 6 (six) hours.     . DULoxetine (CYMBALTA) 60 MG capsule Take 60 mg by mouth daily.      . fentaNYL (DURAGESIC - DOSED MCG/HR) 100 MCG/HR Place 100 mcg onto the skin every other day.    . fluticasone (FLONASE) 50 MCG/ACT nasal spray Place 2 sprays into the nose at bedtime.    [name] guaiFENesin (MUCINEX) 600 MG 12 hr tablet Take 600 mg by mouth 2 (two) times daily.    . hyoscyamine (LEVSIN SL) 0.125 MG SL tablet Place 0.125 mg under the tongue every 4 (four) hours as needed. Before meals as needed    . lidocaine (LIDODERM) 5 % Place 1 patch onto the skin daily.     . metoCLOPramide (REGLAN) 5 MG tablet Take 5 mg by mouth 4 (four) times daily. Take 1 tablet 30 minutes before meals and at bedtime     . moxifloxacin (VIGAMOX) 0.5 % ophthalmic solution Place 1 drop into both eyes 2 (two) times daily.      . nitroGLYCERIN (NITROSTAT) 0.4 MG SL tablet Place 0.4 mg under the tongue every 5  (five) minutes as needed. For chest pain     . oxyCODONE (ROXICODONE) 15 MG immediate release tablet Take 15 mg by mouth every 4 (four) hours as needed.     . pantoprazole (PROTONIX) 40 MG tablet Take 40 mg by mouth 2 (two) times daily.     . predniSONE (DELTASONE) 5 MG tablet Take 5 mg by mouth 2 (two) times a week.     . Prenatal Vit-Fe Fumarate-FA (MULTIVITAMIN-PRENATAL) 27-0.8 MG TABS Take 1 tablet by mouth daily.      . simvastatin (ZOCOR) 10 MG tablet Take 10 mg by mouth at bedtime.      . Vitamin D, Ergocalciferol, (DRISDOL) 50000 UNITS CAPS Take 50,000 Units by mouth every 7 (seven) days. Every saturday    . promethazine (PHENERGAN) 25 MG tablet      No current facility-administered medications for this visit.    Allergies as of 01/20/2015 - Review Complete 01/20/2015  Allergen Reaction Noted  . Clarithromycin Nausea And Vomiting and Nausea Only 09/18/2011    Vitals: BP 136/78 mmHg  Pulse 87  Resp 15  Ht 5\' 4"  (1.626 m)  Wt 98 lb (44.453 kg)  BMI 16.81 kg/m2 Last Weight:  Wt Readings from Last 1 Encounters:  01/20/15 98 lb (44.453 kg)   Last Height:   Ht Readings from Last 1 Encounters:  01/20/15 5\' 4"  (1.626 m)     Physical exam:  General: The patient is awake, alert and appears not in acute distress.  The patient smells of smoke, has very long fingernails.  Head: Normocephalic, with a bruise on the bridge of her nose.  Neck is supple. Mallampati  1 , neck circumference: 12.25.  Cardiovascular:  Regular rate and rhythm, without  murmurs or carotid bruit, and without distended neck veins. Respiratory:  Skin:  Without evidence of edema, or rash Trunk: BMI is low, the patient looks malnourished, she is trembling with head, jaw and hands. .   Neurologic exam : The patient is awake and alert, oriented to place and time.  Memory subjective  described as impaired,  There is a normal attention span & concentration ability.  Speech is fluent without  dysarthria,  But  dysphonia .Mood and affect are appropriate.   There is not TMJ click, but dysphagia to solids , with delayed oropharyngeal phase.    Cranial nerves: Pupils are equal and reactive to light. Dilated pupils. Funduscopic exam deferred, Eye protrusion , Graves disease.  Extraocular movements  in vertical and horizontal planes intact and without nystagmus. Visual fields by finger perimetry are intact. Hearing to finger rub intact.  Facial sensation intact to fine touch. Facial motor strength is symmetric and tongue and uvula move midline. Motor exam: The patient does not have cogwheeling , she has very mild rigidity . she does not have have an exclusive resting tremor, more action tremor and slight dysphonia reported.  Bilaterally equal grip strength.  I see today nor right-hand-dominant for her tremor when be performed rapid alternating movements. Both hands tremble.  Sensory:  Fine touch, pinprick and vibration were tested in all extremities. Lower extremities aren't numb from the mid cold level down she has very sensitive soles and Babinski maneuver testing was perceived as extremely painful. She reports lancinating pain and deep hot needle sticks as dysesthesias toes and feet.   Proprioception is tested in the upper extremities only. There is ataxia and dysmetria. Tremor was noted before she initiated the movement .   Coordination: Rapid alternating movements in the fingers/hands is slowed . Finger-to-nose maneuver with right dominant impairment , with  evidence of ataxia, dysmetria and large amplitudeTremor with writing test.   Tremorgram attached.   Gait and station: Patient walks with assistive device, cane but is not stable. She is propulsing and trembling, shuffling and wide based gait. [name] She would be more stable with a walker.  She reports that when she goes shopping and leans on a shopping cart she is safer / faster. This alone indicates that she needs a walker.  Deep tendon reflexes: in the  upper and lower extremities are absent - Babinski maneuver unresponsive .   Assessment:  After physical and neurologic examination, review of laboratory studies, imaging, neurophysiology testing and pre-existing records, assessment will be reviewed on the problem list.  #1 secondary parkinsonism following decades of Reglan use. Now on Zofran and Phenergan, yet Reglan is still listed as an active medication. This is not parkinson's disease.  #2 spinal stenosis, DDD,  with gait disorder, chronic lower back pain, hip and leg pain and propulsive tendency. This patient has lost all strength in trunk and quadriceps. Myoatrophy .  Cannot rise from a chair without bracing / assistance .Recommend physical therapy for assessment of walker or mult-ipod cane. # 3 weight loss, cachexia,  Muscle atrophy - EDS and dizziness. The patient reports vertigo spells after sudden movements. Evaluate with vestibular rehab.  #3 painful peripheral neuropathy especially with sensory abnormalities in the feet and lower extremity below the knee. Pain  management Dr Vira Blanco, neurologist.   Plan:  Treatment plan and additional workup will be reviewed under Problem List. Multifactorial gait disorder. Very evident as the patient's tremor "wide based gait with externally rotated feet her leaning forward of the cane. She also has a truncal tremor as she walks. Please refer to rehab and pain therapy  for back evaluation . She is using chronic narcotic pain medication.   Please do not refer again to neurology . Fall risk assessment is done through PT or neuro rehab.  No further neurologic work up.

## 2015-01-21 DIAGNOSIS — M790 Rheumatism, unspecified: Secondary | ICD-10-CM | POA: Diagnosis not present

## 2015-01-24 DIAGNOSIS — M790 Rheumatism, unspecified: Secondary | ICD-10-CM | POA: Diagnosis not present

## 2015-01-25 DIAGNOSIS — M790 Rheumatism, unspecified: Secondary | ICD-10-CM | POA: Diagnosis not present

## 2015-01-26 DIAGNOSIS — M790 Rheumatism, unspecified: Secondary | ICD-10-CM | POA: Diagnosis not present

## 2015-01-27 DIAGNOSIS — M790 Rheumatism, unspecified: Secondary | ICD-10-CM | POA: Diagnosis not present

## 2015-01-28 DIAGNOSIS — M790 Rheumatism, unspecified: Secondary | ICD-10-CM | POA: Diagnosis not present

## 2015-01-31 DIAGNOSIS — M790 Rheumatism, unspecified: Secondary | ICD-10-CM | POA: Diagnosis not present

## 2015-02-01 DIAGNOSIS — M790 Rheumatism, unspecified: Secondary | ICD-10-CM | POA: Diagnosis not present

## 2015-02-02 DIAGNOSIS — M790 Rheumatism, unspecified: Secondary | ICD-10-CM | POA: Diagnosis not present

## 2015-02-03 ENCOUNTER — Other Ambulatory Visit: Payer: Self-pay | Admitting: Gastroenterology

## 2015-02-03 DIAGNOSIS — K529 Noninfective gastroenteritis and colitis, unspecified: Secondary | ICD-10-CM | POA: Diagnosis not present

## 2015-02-03 DIAGNOSIS — K648 Other hemorrhoids: Secondary | ICD-10-CM | POA: Diagnosis not present

## 2015-02-03 DIAGNOSIS — R634 Abnormal weight loss: Secondary | ICD-10-CM | POA: Diagnosis not present

## 2015-02-03 DIAGNOSIS — M790 Rheumatism, unspecified: Secondary | ICD-10-CM | POA: Diagnosis not present

## 2015-02-03 DIAGNOSIS — K5730 Diverticulosis of large intestine without perforation or abscess without bleeding: Secondary | ICD-10-CM | POA: Diagnosis not present

## 2015-02-04 DIAGNOSIS — M790 Rheumatism, unspecified: Secondary | ICD-10-CM | POA: Diagnosis not present

## 2015-02-07 DIAGNOSIS — M790 Rheumatism, unspecified: Secondary | ICD-10-CM | POA: Diagnosis not present

## 2015-02-08 DIAGNOSIS — M790 Rheumatism, unspecified: Secondary | ICD-10-CM | POA: Diagnosis not present

## 2015-02-09 DIAGNOSIS — M790 Rheumatism, unspecified: Secondary | ICD-10-CM | POA: Diagnosis not present

## 2015-02-10 DIAGNOSIS — M790 Rheumatism, unspecified: Secondary | ICD-10-CM | POA: Diagnosis not present

## 2015-02-11 DIAGNOSIS — M790 Rheumatism, unspecified: Secondary | ICD-10-CM | POA: Diagnosis not present

## 2015-02-14 DIAGNOSIS — M790 Rheumatism, unspecified: Secondary | ICD-10-CM | POA: Diagnosis not present

## 2015-02-15 DIAGNOSIS — M790 Rheumatism, unspecified: Secondary | ICD-10-CM | POA: Diagnosis not present

## 2015-02-16 DIAGNOSIS — M790 Rheumatism, unspecified: Secondary | ICD-10-CM | POA: Diagnosis not present

## 2015-02-17 DIAGNOSIS — M790 Rheumatism, unspecified: Secondary | ICD-10-CM | POA: Diagnosis not present

## 2015-02-18 DIAGNOSIS — M790 Rheumatism, unspecified: Secondary | ICD-10-CM | POA: Diagnosis not present

## 2015-02-21 DIAGNOSIS — M790 Rheumatism, unspecified: Secondary | ICD-10-CM | POA: Diagnosis not present

## 2015-02-22 DIAGNOSIS — M790 Rheumatism, unspecified: Secondary | ICD-10-CM | POA: Diagnosis not present

## 2015-02-23 DIAGNOSIS — M790 Rheumatism, unspecified: Secondary | ICD-10-CM | POA: Diagnosis not present

## 2015-02-24 DIAGNOSIS — M790 Rheumatism, unspecified: Secondary | ICD-10-CM | POA: Diagnosis not present

## 2015-02-25 DIAGNOSIS — M790 Rheumatism, unspecified: Secondary | ICD-10-CM | POA: Diagnosis not present

## 2015-02-28 ENCOUNTER — Emergency Department (HOSPITAL_COMMUNITY)
Admission: EM | Admit: 2015-02-28 | Discharge: 2015-03-01 | Disposition: A | Discharge status: Home / Self Care | Payer: Medicare Other | Attending: Emergency Medicine | Admitting: Emergency Medicine

## 2015-02-28 ENCOUNTER — Encounter (HOSPITAL_COMMUNITY): Payer: Self-pay | Admitting: *Deleted

## 2015-02-28 DIAGNOSIS — Z9889 Other specified postprocedural states: Secondary | ICD-10-CM | POA: Insufficient documentation

## 2015-02-28 DIAGNOSIS — E119 Type 2 diabetes mellitus without complications: Secondary | ICD-10-CM | POA: Insufficient documentation

## 2015-02-28 DIAGNOSIS — R404 Transient alteration of awareness: Secondary | ICD-10-CM

## 2015-02-28 DIAGNOSIS — K219 Gastro-esophageal reflux disease without esophagitis: Secondary | ICD-10-CM | POA: Diagnosis not present

## 2015-02-28 DIAGNOSIS — R4182 Altered mental status, unspecified: Secondary | ICD-10-CM | POA: Diagnosis not present

## 2015-02-28 DIAGNOSIS — Z8669 Personal history of other diseases of the nervous system and sense organs: Secondary | ICD-10-CM | POA: Diagnosis not present

## 2015-02-28 DIAGNOSIS — Z86718 Personal history of other venous thrombosis and embolism: Secondary | ICD-10-CM | POA: Diagnosis not present

## 2015-02-28 DIAGNOSIS — N189 Chronic kidney disease, unspecified: Secondary | ICD-10-CM | POA: Insufficient documentation

## 2015-02-28 DIAGNOSIS — Z8711 Personal history of peptic ulcer disease: Secondary | ICD-10-CM | POA: Diagnosis not present

## 2015-02-28 DIAGNOSIS — Z79899 Other long term (current) drug therapy: Secondary | ICD-10-CM | POA: Diagnosis not present

## 2015-02-28 DIAGNOSIS — G20 Parkinson's disease: Secondary | ICD-10-CM | POA: Insufficient documentation

## 2015-02-28 DIAGNOSIS — R519 Headache, unspecified: Secondary | ICD-10-CM

## 2015-02-28 DIAGNOSIS — I129 Hypertensive chronic kidney disease with stage 1 through stage 4 chronic kidney disease, or unspecified chronic kidney disease: Secondary | ICD-10-CM | POA: Diagnosis not present

## 2015-02-28 DIAGNOSIS — M797 Fibromyalgia: Secondary | ICD-10-CM | POA: Diagnosis not present

## 2015-02-28 DIAGNOSIS — J449 Chronic obstructive pulmonary disease, unspecified: Secondary | ICD-10-CM | POA: Diagnosis not present

## 2015-02-28 DIAGNOSIS — G8929 Other chronic pain: Secondary | ICD-10-CM | POA: Insufficient documentation

## 2015-02-28 DIAGNOSIS — I25119 Atherosclerotic heart disease of native coronary artery with unspecified angina pectoris: Secondary | ICD-10-CM | POA: Insufficient documentation

## 2015-02-28 DIAGNOSIS — Z8672 Personal history of thrombophlebitis: Secondary | ICD-10-CM | POA: Diagnosis not present

## 2015-02-28 DIAGNOSIS — M1990 Unspecified osteoarthritis, unspecified site: Secondary | ICD-10-CM | POA: Insufficient documentation

## 2015-02-28 DIAGNOSIS — Z7982 Long term (current) use of aspirin: Secondary | ICD-10-CM | POA: Diagnosis not present

## 2015-02-28 DIAGNOSIS — M549 Dorsalgia, unspecified: Secondary | ICD-10-CM | POA: Insufficient documentation

## 2015-02-28 DIAGNOSIS — R51 Headache: Secondary | ICD-10-CM | POA: Diagnosis present

## 2015-02-28 HISTORY — DX: Parkinson's disease: G20

## 2015-02-28 HISTORY — DX: Parkinson's disease without dyskinesia, without mention of fluctuations: G20.A1

## 2015-02-28 NOTE — ED Notes (Signed)
Pt c/o headache that began last night; pt states that the headache is not as bad as it was; the family that lives with pt advised the friend that brought pt to the ER that pt was not acting right; per report pt was naked in the bathroom; pt had an incontinent episode and per reports pt was mumbling; pt is awake but sleepy and not and mumbling in triage; pt reports that she thinks the family that lives with her is taking her pain pills; pt states that she is out of her pain medications from the pain clinic; pt also reports that she has "patches and tranquilizers"

## 2015-03-01 ENCOUNTER — Emergency Department (HOSPITAL_COMMUNITY): Payer: Medicare Other

## 2015-03-01 DIAGNOSIS — R51 Headache: Secondary | ICD-10-CM | POA: Diagnosis not present

## 2015-03-01 LAB — URINALYSIS, ROUTINE W REFLEX MICROSCOPIC
Bilirubin Urine: NEGATIVE
Glucose, UA: NEGATIVE mg/dL
Hgb urine dipstick: NEGATIVE
Ketones, ur: NEGATIVE mg/dL
Nitrite: NEGATIVE
Protein, ur: NEGATIVE mg/dL
Specific Gravity, Urine: 1.012 (ref 1.005–1.030)
Urobilinogen, UA: 0.2 mg/dL (ref 0.0–1.0)
pH: 5.5 (ref 5.0–8.0)

## 2015-03-01 LAB — COMPREHENSIVE METABOLIC PANEL
ALT: 12 U/L — ABNORMAL LOW (ref 14–54)
AST: 11 U/L — ABNORMAL LOW (ref 15–41)
Albumin: 3.2 g/dL — ABNORMAL LOW (ref 3.5–5.0)
Alkaline Phosphatase: 78 U/L (ref 38–126)
Anion gap: 3 — ABNORMAL LOW (ref 5–15)
BUN: 24 mg/dL — ABNORMAL HIGH (ref 6–20)
CO2: 22 mmol/L (ref 22–32)
Calcium: 8.2 mg/dL — ABNORMAL LOW (ref 8.9–10.3)
Chloride: 115 mmol/L — ABNORMAL HIGH (ref 101–111)
Creatinine, Ser: 1.28 mg/dL — ABNORMAL HIGH (ref 0.44–1.00)
GFR calc Af Amer: 48 mL/min — ABNORMAL LOW (ref >60)
GFR calc non Af Amer: 42 mL/min — ABNORMAL LOW (ref >60)
Glucose, Bld: 97 mg/dL (ref 70–99)
Potassium: 3.5 mmol/L (ref 3.5–5.1)
Sodium: 140 mmol/L (ref 135–145)
Total Bilirubin: 0.6 mg/dL (ref 0.3–1.2)
Total Protein: 5.7 g/dL — ABNORMAL LOW (ref 6.5–8.1)

## 2015-03-01 LAB — I-STAT CHEM 8, ED
BUN: 4 mg/dL — ABNORMAL LOW (ref 6–20)
Calcium, Ion: 1.15 mmol/L (ref 1.13–1.30)
Chloride: 107 mmol/L (ref 101–111)
Creatinine, Ser: 1.2 mg/dL — ABNORMAL HIGH (ref 0.44–1.00)
Glucose, Bld: 93 mg/dL (ref 70–99)
HCT: 39 % (ref 36.0–46.0)
Hemoglobin: 13.3 g/dL (ref 12.0–15.0)
Potassium: 3.6 mmol/L (ref 3.5–5.1)
Sodium: 141 mmol/L (ref 135–145)
TCO2: 19 mmol/L (ref 0–100)

## 2015-03-01 LAB — DIFFERENTIAL
Basophils Absolute: 0 10*3/uL (ref 0.0–0.1)
Basophils Relative: 0 % (ref 0–1)
Eosinophils Absolute: 0.3 10*3/uL (ref 0.0–0.7)
Eosinophils Relative: 3 % (ref 0–5)
Lymphocytes Relative: 15 % (ref 12–46)
Lymphs Abs: 1.3 10*3/uL (ref 0.7–4.0)
Monocytes Absolute: 0.5 10*3/uL (ref 0.1–1.0)
Monocytes Relative: 6 % (ref 3–12)
Neutro Abs: 6.5 10*3/uL (ref 1.7–7.7)
Neutrophils Relative %: 76 % (ref 43–77)

## 2015-03-01 LAB — URINE MICROSCOPIC-ADD ON

## 2015-03-01 LAB — RAPID URINE DRUG SCREEN, HOSP PERFORMED
Amphetamines: NOT DETECTED
Barbiturates: NOT DETECTED
Benzodiazepines: POSITIVE — AB
Cocaine: NOT DETECTED
Opiates: NOT DETECTED
Tetrahydrocannabinol: NOT DETECTED

## 2015-03-01 LAB — CBC
HCT: 37.3 % (ref 36.0–46.0)
Hemoglobin: 12.4 g/dL (ref 12.0–15.0)
MCH: 32 pg (ref 26.0–34.0)
MCHC: 33.2 g/dL (ref 30.0–36.0)
MCV: 96.4 fL (ref 78.0–100.0)
Platelets: 275 10*3/uL (ref 150–400)
RBC: 3.87 MIL/uL (ref 3.87–5.11)
RDW: 14.6 % (ref 11.5–15.5)
WBC: 8.7 10*3/uL (ref 4.0–10.5)

## 2015-03-01 LAB — PROTIME-INR
INR: 0.97 (ref 0.00–1.49)
Prothrombin Time: 12.9 seconds (ref 11.6–15.2)

## 2015-03-01 LAB — APTT: aPTT: 27 seconds (ref 24–37)

## 2015-03-01 LAB — I-STAT TROPONIN, ED: Troponin i, poc: 0 ng/mL (ref 0.00–0.08)

## 2015-03-01 LAB — ETHANOL: Alcohol, Ethyl (B): <5 mg/dL (ref <5)

## 2015-03-01 NOTE — ED Provider Notes (Signed)
CSN: [id number]     Arrival date & time 02/28/15  2138 History   First MD Initiated Contact with Patient 03/01/15 0210     Chief Complaint  Patient presents with  . Headache     (Consider location/radiation/quality/duration/timing/severity/associated sxs/prior Treatment) HPI Comments: This is a 69 year old chronically ill female who presented to the emergency room tonight with altered mental status and headache.  She is accompanied by her neighbor who states that she was not speaking correctly or sounded garbled earlier to her family, but when she arrived to the patient's home.  She was speaking clearly.  She states she did speak with her earlier in the day and she was at her normal baseline. Patient does have history of having a carotid endarterectomy recently.  She takes one baby aspirin daily as an anticoagulant.  [facility].  She is out of all of her pain medication.  She recently visited her daughter who had a hysterectomy and they noted that there was some pain medicines missing.  The patient states that her roommate is stealing her medicine.  She does have on a fentanyl patch.  She uses one, fentanyl patch every other day.  She states she's not been eating well because she has no appetite but no nausea or vomiting, diarrhea or constipation.  She has reports that she has not fallen or had any trauma  Patient is a 69 y.o. female presenting with headaches. The history is provided by the patient.  Headache Pain location:  Generalized Quality:  Unable to specify Radiates to:  Does not radiate Severity currently:  4/10 Severity at highest:  3/10 Onset quality:  Unable to specify Duration:  1 day Timing:  Unable to specify Progression:  Unable to specify Chronicity:  New Similar to prior headaches: yes   Relieved by:  Nothing Worsened by:  Nothing Ineffective treatments:  None tried Associated symptoms: back pain   Associated symptoms: no  dizziness, no facial pain, no fever, no near-syncope and no sinus pressure     Past Medical History  Diagnosis Date  . Asthma   . HTN (hypertension)   . Grave's disease   . Fibromyalgia   . Thrombophlebitis   . Depression   . Angina   . Shortness of breath   . COPD (chronic obstructive pulmonary disease)   . Sleep apnea   . Blood dyscrasia     hx of thrombphlebitis  . Recurrent upper respiratory infection (URI)   . GERD (gastroesophageal reflux disease)   . Headache(784.0)   . Arthritis   . CAD (coronary artery disease)   . Chronic kidney disease   . Peripheral vascular disease   . Diabetes mellitus   . Ulcer   . Diarrhea     chronic   . Thyroid disorder   . Anxiety and depression   . Chronic pain     leg and feet  . DDD (degenerative disc disease)   . Rhinitis   . OP (osteoporosis)   . Vitamin B 12 deficiency   . PUD (peptic ulcer disease)   . DDD (degenerative disc disease)   . Spinal stenosis   . Spinal stenosis of lumbar region 04/23/2013  . Tobacco use disorder 04/23/2013  . DVT (deep venous thrombosis)   . Carotid artery occlusion   . Parkinson's disease   . Dry mouth    Past Surgical History  Procedure Laterality Date  . Gastrectomy  age 46   Part of small intestin and part of stomach  . Cholecystectomy  1999  . Carotid endarterectomy Left Sept. 20,2011    cea  . Left heart catheterization with coronary angiogram N/A 10/02/2011    Procedure: LEFT HEART CATHETERIZATION WITH CORONARY ANGIOGRAM;  Surgeon: Sueanne Margarita, MD;  Location: [facility];  Service: Cardiovascular;  Laterality: N/A;   Family History  Problem Relation Age of Onset  . Diabetes Mother   . Hyperlipidemia Mother   . Heart attack Mother   . Other Mother     varicose veins,respiratory,stroke  . Heart disease Mother     before age 20  . Hypertension Mother   . Varicose Veins Mother   . Diabetes Father   . Heart disease Father     before age 64  . Hyperlipidemia Father   .  Heart attack Father   . Other Father     varicose veins  . Hypertension Father   . Varicose Veins Father   . Diabetes Sister   . Heart disease Sister     before age 69  . Hyperlipidemia Sister   . Heart attack Sister   . Other Sister     varicose veins  . Hypertension Sister   . Varicose Veins Sister   . Peripheral vascular disease Sister   . Diabetes Sister   . Hyperlipidemia Sister   . Hypertension Sister   . Varicose Veins Sister    History  Substance Use Topics  . Smoking status: Current Every Day Smoker -- 2.00 packs/day for 50 years    Types: Cigarettes  . Smokeless tobacco: Never Used     Comment: cessation info given and reviewed  . Alcohol Use: No   OB History    No data available     Review of Systems  Constitutional: Negative for fever.  HENT: Negative for dental problem and sinus pressure.   Respiratory: Negative for shortness of breath.   Cardiovascular: Negative for near-syncope.  Genitourinary: Negative for dysuria.  Musculoskeletal: Positive for back pain.  Skin: Negative for pallor and rash.  Neurological: Positive for headaches. Negative for dizziness.  All other systems reviewed and are negative.     Allergies  Clarithromycin  Home Medications   Prior to Admission medications   Medication Sig Start Date End Date Taking? Authorizing Provider  albuterol-ipratropium (COMBIVENT) 18-103 MCG/ACT inhaler Inhale 2 puffs into the lungs every 6 (six) hours as needed. For shortness of breath    Yes Historical Provider, MD  amLODipine (NORVASC) 5 MG tablet Take 5 mg by mouth daily.     Yes Historical Provider, MD  aspirin 81 MG tablet Take 81 mg by mouth daily.     Yes Historical Provider, MD  calcium acetate (PHOSLO) 667 MG capsule Take 667 mg by mouth daily.    Yes Historical Provider, MD  Chlorpheniramine-Acetaminophen (CORICIDIN HBP COLD/FLU PO) Take 1 tablet by mouth 2 (two) times daily as needed (for cold symptoms).    Yes Historical Provider, MD   clonazePAM (KLONOPIN) 1 MG tablet Take 1 mg by mouth 2 (two) times daily as needed. For anxiety   Yes Historical Provider, MD  cyclobenzaprine (FLEXERIL) 5 MG tablet Take 5 mg by mouth as needed.  10/30/14  Yes Historical Provider, MD  dicyclomine (BENTYL) 20 MG tablet Take 20 mg by mouth every 6 (six) hours.    Yes Historical Provider, MD  DULoxetine (CYMBALTA) 60 MG capsule Take 60 mg by mouth daily.  Yes Historical Provider, MD  fentaNYL (DURAGESIC - DOSED MCG/HR) 100 MCG/HR Place 100 mcg onto the skin every other day.   Yes Historical Provider, MD  fluticasone (FLONASE) 50 MCG/ACT nasal spray Place 2 sprays into the nose at bedtime as needed (for congestion.).    Yes Historical Provider, MD  guaiFENesin (MUCINEX) 600 MG 12 hr tablet Take 600 mg by mouth 2 (two) times daily as needed for cough or to loosen phlegm.    Yes Historical Provider, MD  hyoscyamine (LEVSIN SL) 0.125 MG SL tablet Place 0.125 mg under the tongue every 4 (four) hours as needed. Before meals as needed   Yes Historical Provider, MD  lidocaine (LIDODERM) 5 % Place 1 patch onto the skin daily as needed (for pain.).  01/03/15  Yes Historical Provider, MD  metoCLOPramide (REGLAN) 5 MG tablet Take 5 mg by mouth 4 (four) times daily. Take 1 tablet 30 minutes before meals and at bedtime    Yes Historical Provider, MD  nitroGLYCERIN (NITROSTAT) 0.4 MG SL tablet Place 0.4 mg under the tongue every 5 (five) minutes as needed. For chest pain    Yes Historical Provider, MD  oxyCODONE (ROXICODONE) 15 MG immediate release tablet Take 15 mg by mouth every 4 (four) hours as needed.  12/01/14  Yes Historical Provider, MD  pantoprazole (PROTONIX) 40 MG tablet Take 40 mg by mouth 2 (two) times daily.    Yes Historical Provider, MD  predniSONE (DELTASONE) 5 MG tablet Take 5 mg by mouth 2 (two) times a week.    Yes Historical Provider, MD  Prenatal Vit-Fe Fumarate-FA (MULTIVITAMIN-PRENATAL) 27-0.8 MG TABS Take 1 tablet by mouth daily.     Yes  Historical Provider, MD  promethazine (PHENERGAN) 25 MG tablet Take 25 mg by mouth every 6 (six) hours as needed for nausea.  11/11/14  Yes Historical Provider, MD  simvastatin (ZOCOR) 10 MG tablet Take 10 mg by mouth at bedtime.     Yes Historical Provider, MD  Vitamin D, Ergocalciferol, (DRISDOL) 50000 UNITS CAPS Take 50,000 Units by mouth every 7 (seven) days.    Yes Historical Provider, MD   BP 146/74 mmHg  Pulse 86  Temp(Src) 97.6 F (36.4 C) (Oral)  Resp 18  Ht '5\' 4"'$  (1.626 m)  Wt 92 lb (41.731 kg)  BMI 15.78 kg/m2  SpO2 100% Physical Exam  Constitutional: She appears well-developed and well-nourished.  HENT:  Head: Normocephalic.  Eyes: Pupils are equal, round, and reactive to light.  Neck: Normal range of motion.  Cardiovascular: Normal rate.   Pulmonary/Chest: Effort normal.  Abdominal: Soft. She exhibits no distension. There is no tenderness.  Musculoskeletal: Normal range of motion.  Skin: Skin is warm.  Psychiatric: She has a normal mood and affect.  Nursing note and vitals reviewed.   ED Course  Procedures (including critical care time) Labs Review Labs Reviewed  COMPREHENSIVE METABOLIC PANEL - Abnormal; Notable for the following:    Chloride 115 (*)    BUN 24 (*)    Creatinine, Ser 1.28 (*)    Calcium 8.2 (*)    Total Protein 5.7 (*)    Albumin 3.2 (*)    AST 11 (*)    ALT 12 (*)    GFR calc non Af Amer 42 (*)    GFR calc Af Amer 48 (*)    Anion gap 3 (*)    All other components within normal limits  URINE RAPID DRUG SCREEN (HOSP PERFORMED) - Abnormal; Notable for the following:  Benzodiazepines POSITIVE (*)    All other components within normal limits  URINALYSIS, ROUTINE W REFLEX MICROSCOPIC - Abnormal; Notable for the following:    Leukocytes, UA TRACE (*)    All other components within normal limits  I-STAT CHEM 8, ED - Abnormal; Notable for the following:    BUN 4 (*)    Creatinine, Ser 1.20 (*)    All other components within normal limits   ETHANOL  PROTIME-INR  APTT  CBC  DIFFERENTIAL  URINE MICROSCOPIC-ADD ON  I-STAT TROPOININ, ED    Imaging Review Ct Head Wo Contrast  03/01/2015   CLINICAL DATA:  Headache that began last night. Question altered mental status.  EXAM: CT HEAD WITHOUT CONTRAST  TECHNIQUE: Contiguous axial images were obtained from the base of the skull through the vertex without intravenous contrast.  COMPARISON:  MRI 11/05/2007  FINDINGS: There is no intracranial hemorrhage, mass or evidence of acute infarction. There is no extra-axial fluid collection. There is moderately severe generalized atrophy which has worsened from 11/05/2007. There is extensive periventricular hypodensity in the white matter which likely represents chronic small vessel disease. This also appears worsened. No superimposed acute findings are evident. The visible portions of the paranasal sinuses and mastoid air cells are clear.  IMPRESSION: Moderately severe chronic atrophy and microvascular disease. No acute findings.   Electronically Signed   By: Andreas Newport M.D.   On: 03/01/2015 02:58     EKG Interpretation None     Due to recent surgery concern for stroke, but most likely poly pharmacy Patient's head CT is unchanged.  There is no acute infarct, hemorrhage or mass explaining her altered mental status.  Her drug screen is only positive for benzodiazepine, which she takes on a regular basis .  He has an appoint with her pain clinic on Thursday to have her routine medications filled.  She does have 1 more that no pain patch which will bring her to the point where she will be seen by her physicians MDM   Final diagnoses:  Altered consciousness  Nonintractable headache, unspecified chronicity pattern, unspecified headache type         Junius Creamer, NP 03/01/15 0541  April Palumbo, MD 03/01/15 [phone]

## 2015-03-01 NOTE — ED Notes (Signed)
Pt in with friends reporting an episode of altered mental status earlier today around 3pm, per friends patient has been groggy all day which is not abnormal, today patient had episode of incontinence, was talking about things that weren't there, had been stumbling when trying to walk. Pt has been off of her oxycodone since Saturday which she takes 5 times a day. Denies any other medication changes, no deficits noted at this time.

## 2015-03-01 NOTE — Discharge Instructions (Signed)
Altered Mental Status Altered mental status most often refers to an abnormal change in your responsiveness and awareness. It can affect your speech, thought, mobility, memory, attention span, or alertness. It can range from slight confusion to complete unresponsiveness (coma). Altered mental status can be a sign of a serious underlying medical condition. Rapid evaluation and medical treatment is necessary for patients having an altered mental status. CAUSES   Low blood sugar (hypoglycemia) or diabetes.  Severe loss of body fluids (dehydration) or a body salt (electrolyte) imbalance.  A stroke or other neurologic problem, such as dementia or delirium.  A head injury or tumor.  A drug or alcohol overdose.  Exposure to toxins or poisons.  Depression, anxiety, and stress.  A low oxygen level (hypoxia).  An infection.  Blood loss.  Twitching or shaking (seizure).  Heart problems, such as heart attack or heart rhythm problems (arrhythmias).  A body temperature that is too low or too high (hypothermia or hyperthermia). DIAGNOSIS  A diagnosis is based on your history, symptoms, physical and neurologic examinations, and diagnostic tests. Diagnostic tests may include:  Measurement of your blood pressure, pulse, breathing, and oxygen levels (vital signs).  Blood tests.  Urine tests.  X-ray exams.  A computerized magnetic scan (magnetic resonance imaging, MRI).  A computerized X-ray scan (computed tomography, CT scan). TREATMENT  Treatment will depend on the cause. Treatment may include:  Management of an underlying medical or mental health condition.  Critical care or support in the hospital. [place]   Only take over-the-counter or prescription medicines for pain, discomfort, or fever as directed by your caregiver.  Manage underlying conditions as directed by your caregiver.  Eat a healthy, well-balanced diet to maintain strength.  Join a support group or  prevention program to cope with the condition or trauma that caused the altered mental status. Ask your caregiver to help choose a program that works for you.  Follow up with your caregiver for further examination, therapy, or testing as directed. SEEK MEDICAL CARE IF:   You feel unwell or have chills.  You or your family notice a change in your behavior or your alertness.  You have trouble following your caregiver's treatment plan.  You have questions or concerns. SEEK IMMEDIATE MEDICAL CARE IF:   You have a rapid heartbeat or have chest pain.  You have difficulty breathing.  You have a fever.  You have a headache with a stiff neck.  You cough up blood.  You have blood in your urine or stool.  You have severe agitation or confusion. MAKE SURE YOU:   Understand these instructions.  Will watch your condition.  Will get help right away if you are not doing well or get worse. Document Released: 04/04/2010 Document Revised: 01/07/2012 Document Reviewed: 04/04/2010 [facility] Patient Information 2015 [place], [place]. This information is not intended to replace advice given to you by your health care provider. Make sure you discuss any questions you have with your health care provider. Night your head CT is normal.  Her labs are normal, your drug screen shows that she use benzodiazepines on a regular basis.  Please try to eat and drink more regularly measure to follow-up with your pain clinic doctor as scheduled on Thursday

## 2015-03-09 ENCOUNTER — Other Ambulatory Visit: Payer: Self-pay | Admitting: Internal Medicine

## 2015-03-09 ENCOUNTER — Other Ambulatory Visit (HOSPITAL_COMMUNITY): Payer: Medicare Other

## 2015-03-09 DIAGNOSIS — R4189 Other symptoms and signs involving cognitive functions and awareness: Secondary | ICD-10-CM

## 2015-03-09 DIAGNOSIS — R404 Transient alteration of awareness: Principal | ICD-10-CM

## 2015-03-17 ENCOUNTER — Other Ambulatory Visit: Payer: Self-pay

## 2015-03-17 ENCOUNTER — Ambulatory Visit (HOSPITAL_COMMUNITY): Payer: Medicare Other | Attending: Cardiology

## 2015-03-17 DIAGNOSIS — I10 Essential (primary) hypertension: Secondary | ICD-10-CM

## 2015-03-17 DIAGNOSIS — Z720 Tobacco use: Secondary | ICD-10-CM | POA: Insufficient documentation

## 2015-03-17 DIAGNOSIS — E119 Type 2 diabetes mellitus without complications: Secondary | ICD-10-CM | POA: Insufficient documentation

## 2015-03-17 DIAGNOSIS — R4189 Other symptoms and signs involving cognitive functions and awareness: Secondary | ICD-10-CM

## 2015-03-17 DIAGNOSIS — R404 Transient alteration of awareness: Secondary | ICD-10-CM

## 2015-04-23 ENCOUNTER — Emergency Department (HOSPITAL_COMMUNITY)
Admission: EM | Admit: 2015-04-23 | Discharge: 2015-04-23 | Disposition: A | Discharge status: Home / Self Care | Payer: Medicare Other | Attending: Emergency Medicine | Admitting: Emergency Medicine

## 2015-04-23 ENCOUNTER — Encounter (HOSPITAL_COMMUNITY): Payer: Self-pay | Admitting: *Deleted

## 2015-04-23 DIAGNOSIS — Z8711 Personal history of peptic ulcer disease: Secondary | ICD-10-CM | POA: Diagnosis not present

## 2015-04-23 DIAGNOSIS — F329 Major depressive disorder, single episode, unspecified: Secondary | ICD-10-CM | POA: Diagnosis not present

## 2015-04-23 DIAGNOSIS — Z862 Personal history of diseases of the blood and blood-forming organs and certain disorders involving the immune mechanism: Secondary | ICD-10-CM | POA: Diagnosis not present

## 2015-04-23 DIAGNOSIS — Z7982 Long term (current) use of aspirin: Secondary | ICD-10-CM | POA: Insufficient documentation

## 2015-04-23 DIAGNOSIS — I2510 Atherosclerotic heart disease of native coronary artery without angina pectoris: Secondary | ICD-10-CM | POA: Insufficient documentation

## 2015-04-23 DIAGNOSIS — E119 Type 2 diabetes mellitus without complications: Secondary | ICD-10-CM | POA: Insufficient documentation

## 2015-04-23 DIAGNOSIS — Z79899 Other long term (current) drug therapy: Secondary | ICD-10-CM | POA: Insufficient documentation

## 2015-04-23 DIAGNOSIS — K219 Gastro-esophageal reflux disease without esophagitis: Secondary | ICD-10-CM | POA: Diagnosis not present

## 2015-04-23 DIAGNOSIS — M810 Age-related osteoporosis without current pathological fracture: Secondary | ICD-10-CM | POA: Diagnosis not present

## 2015-04-23 DIAGNOSIS — J45909 Unspecified asthma, uncomplicated: Secondary | ICD-10-CM | POA: Insufficient documentation

## 2015-04-23 DIAGNOSIS — J449 Chronic obstructive pulmonary disease, unspecified: Secondary | ICD-10-CM | POA: Insufficient documentation

## 2015-04-23 DIAGNOSIS — N189 Chronic kidney disease, unspecified: Secondary | ICD-10-CM | POA: Diagnosis not present

## 2015-04-23 DIAGNOSIS — X58XXXA Exposure to other specified factors, initial encounter: Secondary | ICD-10-CM | POA: Diagnosis not present

## 2015-04-23 DIAGNOSIS — Z720 Tobacco use: Secondary | ICD-10-CM | POA: Insufficient documentation

## 2015-04-23 DIAGNOSIS — I129 Hypertensive chronic kidney disease with stage 1 through stage 4 chronic kidney disease, or unspecified chronic kidney disease: Secondary | ICD-10-CM | POA: Diagnosis not present

## 2015-04-23 DIAGNOSIS — F419 Anxiety disorder, unspecified: Secondary | ICD-10-CM | POA: Diagnosis not present

## 2015-04-23 DIAGNOSIS — Y92009 Unspecified place in unspecified non-institutional (private) residence as the place of occurrence of the external cause: Secondary | ICD-10-CM | POA: Diagnosis not present

## 2015-04-23 DIAGNOSIS — Z86718 Personal history of other venous thrombosis and embolism: Secondary | ICD-10-CM | POA: Insufficient documentation

## 2015-04-23 DIAGNOSIS — M1990 Unspecified osteoarthritis, unspecified site: Secondary | ICD-10-CM | POA: Diagnosis not present

## 2015-04-23 DIAGNOSIS — G20 Parkinson's disease: Secondary | ICD-10-CM | POA: Insufficient documentation

## 2015-04-23 DIAGNOSIS — G8929 Other chronic pain: Secondary | ICD-10-CM | POA: Insufficient documentation

## 2015-04-23 DIAGNOSIS — T63461A Toxic effect of venom of wasps, accidental (unintentional), initial encounter: Secondary | ICD-10-CM | POA: Diagnosis present

## 2015-04-23 DIAGNOSIS — Y998 Other external cause status: Secondary | ICD-10-CM | POA: Diagnosis not present

## 2015-04-23 DIAGNOSIS — Y9389 Activity, other specified: Secondary | ICD-10-CM | POA: Insufficient documentation

## 2015-04-23 MED ORDER — MORPHINE SULFATE 4 MG/ML IJ SOLN
4.0000 mg | Freq: Once | INTRAMUSCULAR | Status: AC
  Administered 2015-04-23: 4 mg via INTRAVENOUS
  Filled 2015-04-23: qty 1

## 2015-04-23 MED ORDER — HYDROCODONE-ACETAMINOPHEN 5-325 MG PO TABS: 1.0000 | ORAL_TABLET | Freq: Four times a day (QID) | ORAL | Status: DC | PRN

## 2015-04-23 MED ORDER — METHYLPREDNISOLONE SODIUM SUCC 125 MG IJ SOLR
80.0000 mg | Freq: Once | INTRAMUSCULAR | Status: AC
  Administered 2015-04-23: 80 mg via INTRAVENOUS
  Filled 2015-04-23: qty 2

## 2015-04-23 MED ORDER — HYDROMORPHONE HCL 1 MG/ML IJ SOLN
0.5000 mg | Freq: Once | INTRAMUSCULAR | Status: AC
  Administered 2015-04-23: 0.5 mg via INTRAVENOUS
  Filled 2015-04-23: qty 1

## 2015-04-23 NOTE — Discharge Instructions (Signed)
It was our pleasure to provide your ER care today - we hope that you feel better.  Rest. Drink plenty of fluids.  Take motrin or aleve as need.   You may also take hydrocodone as need for pain. No driving when taking hydrocodone. Also, do not take tylenol or acetaminophen containing medication when taking hydrocodone.  Return to ER if worse, new symptoms, trouble breathing, throat closing, other concern.  You were given pain medication in the ER - no driving for the next 4 hours.    Bee, Wasp, or Hornet Sting Your caregiver has diagnosed you as having an insect sting. An insect sting appears as a red lump in the skin that sometimes has a tiny hole in the center, or it may have a stinger in the center of the wound. The most common stings are from wasps, hornets and bees. Individuals have different reactions to insect stings.  A normal reaction may cause pain, swelling, and redness around the sting site.  A localized allergic reaction may cause swelling and redness that extends beyond the sting site.  A large local reaction may continue to develop over the next 12 to 36 hours.  On occasion, the reactions can be severe (anaphylactic reaction). An anaphylactic reaction may cause wheezing; difficulty breathing; chest pain; fainting; raised, itchy, red patches on the skin; a sick feeling to your stomach (nausea); vomiting; cramping; or diarrhea. If you have had an anaphylactic reaction to an insect sting in the past, you are more likely to have one again. HOME CARE INSTRUCTIONS   With bee stings, a small sac of poison is left in the wound. Brushing across this with something such as a credit card, or anything similar, will help remove this and decrease the amount of the reaction. This same procedure will not help a wasp sting as they do not leave behind a stinger and poison sac.  Apply a cold compress for 10 to 20 minutes every hour for 1 to 2 days, depending on severity, to reduce swelling  and itching.  To lessen pain, a paste made of water and baking soda may be rubbed on the bite or sting and left on for 5 minutes.  To relieve itching and swelling, you may use take medication or apply medicated creams or lotions as directed.  Only take over-the-counter or prescription medicines for pain, discomfort, or fever as directed by your caregiver.  Wash the sting site daily with soap and water. Apply antibiotic ointment on the sting site as directed.  If you suffered a severe reaction:  If you did not require hospitalization, an adult will need to stay with you for 24 hours in case the symptoms return.  You may need to wear a medical bracelet or necklace stating the allergy.  You and your family need to learn when and how to use an anaphylaxis kit or epinephrine injection.  If you have had a severe reaction before, always carry your anaphylaxis kit with you. SEEK MEDICAL CARE IF:   None of the above helps within 2 to 3 days.  The area becomes red, warm, tender, and swollen beyond the area of the bite or sting.  You have an oral temperature above 102 F (38.9 C). SEEK IMMEDIATE MEDICAL CARE IF:  You have symptoms of an allergic reaction which are:  Wheezing.  Difficulty breathing.  Chest pain.  Lightheadedness or fainting.  Itchy, raised, red patches on the skin.  Nausea, vomiting, cramping or diarrhea. ANY OF THESE SYMPTOMS  MAY REPRESENT A SERIOUS PROBLEM THAT IS AN EMERGENCY. Do not wait to see if the symptoms will go away. Get medical help right away. Call your local emergency services (911 in [place] DO NOT drive yourself to the hospital. MAKE SURE YOU:   Understand these instructions.  Will watch your condition.  Will get help right away if you are not doing well or get worse. Document Released: 10/15/2005 Document Revised: 01/07/2012 Document Reviewed: 04/01/2010 [facility] Patient Information 2015 [address], [place]. This information is not intended to  replace advice given to you by your health care provider. Make sure you discuss any questions you have with your health care provider.

## 2015-04-23 NOTE — ED Notes (Signed)
Ran over yellow jacket nest - 10+ stings to bilateral arms

## 2015-04-23 NOTE — ED Provider Notes (Signed)
CSN: [id number]     Arrival date & time 04/23/15  1637 History   First MD Initiated Contact with Patient 04/23/15 1640     Chief Complaint  Patient presents with  . Insect Bite     (Consider location/radiation/quality/duration/timing/severity/associated sxs/prior Treatment) The history is provided by the patient and the EMS personnel.  Patient c/o several yellow jacket stings an hour ago at home.  Pt notes stings to bilateral arms and torso. 8-10 stings noted. Pt c/o pain, localized to sting sites. Pain is moderate-severe, constant, burning. Pt also felt itchy, and states felt she was having an allergic rxn. No hx anaphylaxis to bee stings. No throat closing. No trouble breathing or wheezing. No syncope. Otherwise states recent health at baseline, no symptoms prior to bee stings. Pt took 2 benadryl just pta.     Past Medical History  Diagnosis Date  . Asthma   . HTN (hypertension)   . Grave's disease   . Fibromyalgia   . Thrombophlebitis   . Depression   . Angina   . Shortness of breath   . COPD (chronic obstructive pulmonary disease)   . Sleep apnea   . Blood dyscrasia     hx of thrombphlebitis  . Recurrent upper respiratory infection (URI)   . GERD (gastroesophageal reflux disease)   . Headache(784.0)   . Arthritis   . CAD (coronary artery disease)   . Chronic kidney disease   . Peripheral vascular disease   . Diabetes mellitus   . Ulcer   . Diarrhea     chronic   . Thyroid disorder   . Anxiety and depression   . Chronic pain     leg and feet  . DDD (degenerative disc disease)   . Rhinitis   . OP (osteoporosis)   . Vitamin B 12 deficiency   . PUD (peptic ulcer disease)   . DDD (degenerative disc disease)   . Spinal stenosis   . Spinal stenosis of lumbar region 04/23/2013  . Tobacco use disorder 04/23/2013  . DVT (deep venous thrombosis)   . Carotid artery occlusion   . Parkinson's disease   . Dry mouth    Past Surgical History  Procedure Laterality Date  .  Gastrectomy      age 2   Part of small intestin and part of stomach  . Cholecystectomy  1999  . Carotid endarterectomy Left Sept. 20,2011    cea  . Left heart catheterization with coronary angiogram N/A 10/02/2011    Procedure: LEFT HEART CATHETERIZATION WITH CORONARY ANGIOGRAM;  Surgeon: Sueanne Margarita, MD;  Location:  CATH LAB;  Service: Cardiovascular;  Laterality: N/A;   Family History  Problem Relation Age of Onset  . Diabetes Mother   . Hyperlipidemia Mother   . Heart attack Mother   . Other Mother     varicose veins,respiratory,stroke  . Heart disease Mother     before age 42  . Hypertension Mother   . Varicose Veins Mother   . Diabetes Father   . Heart disease Father     before age 30  . Hyperlipidemia Father   . Heart attack Father   . Other Father     varicose veins  . Hypertension Father   . Varicose Veins Father   . Diabetes Sister   . Heart disease Sister     before age 64  . Hyperlipidemia Sister   . Heart attack Sister   . Other Sister     varicose veins  .  Hypertension Sister   . Varicose Veins Sister   . Peripheral vascular disease Sister   . Diabetes Sister   . Hyperlipidemia Sister   . Hypertension Sister   . Varicose Veins Sister    History  Substance Use Topics  . Smoking status: Current Every Day Smoker -- 2.00 packs/day for 50 years    Types: Cigarettes  . Smokeless tobacco: Never Used     Comment: cessation info given and reviewed  . Alcohol Use: No   OB History    No data available     Review of Systems  Constitutional: Negative for fever and chills.  HENT: Negative for trouble swallowing and voice change.   Eyes: Negative for pain.  Respiratory: Negative for shortness of breath.   Cardiovascular: Negative for chest pain.  Gastrointestinal: Negative for vomiting and abdominal pain.  Genitourinary: Negative for flank pain.  Musculoskeletal: Negative for back pain and neck pain.  Skin: Negative for rash.  Neurological: Negative  for syncope.  Hematological: Does not bruise/bleed easily.  Psychiatric/Behavioral: Negative for confusion.      Allergies  Clarithromycin and Bee venom  Home Medications   Prior to Admission medications   Medication Sig Start Date End Date Taking? Authorizing Provider  acetaminophen (TYLENOL) 325 MG tablet Take 650 mg by mouth every 6 (six) hours as needed for moderate pain (pain).   Yes Historical Provider, MD  albuterol-ipratropium (COMBIVENT) 18-103 MCG/ACT inhaler Inhale 2 puffs into the lungs every 6 (six) hours as needed for shortness of breath (sob). For shortness of breath   Yes Historical Provider, MD  amLODipine (NORVASC) 5 MG tablet Take 5 mg by mouth daily.     Yes Historical Provider, MD  aspirin 81 MG tablet Take 81 mg by mouth daily.     Yes Historical Provider, MD  calcium acetate (PHOSLO) 667 MG capsule Take 667 mg by mouth daily.    Yes Historical Provider, MD  clonazePAM (KLONOPIN) 1 MG tablet Take 1 mg by mouth 2 (two) times daily as needed for anxiety (anxiety). For anxiety   Yes Historical Provider, MD  cyclobenzaprine (FLEXERIL) 5 MG tablet Take 5 mg by mouth daily as needed for muscle spasms (muscle spasms).  10/30/14  Yes Historical Provider, MD  dicyclomine (BENTYL) 20 MG tablet Take 20 mg by mouth every 6 (six) hours.    Yes Historical Provider, MD  diphenhydrAMINE (BENADRYL) 25 MG tablet Take 50 mg by mouth every 6 (six) hours as needed (allergic reaction).   Yes Historical Provider, MD  DULoxetine (CYMBALTA) 60 MG capsule Take 60 mg by mouth daily.     Yes Historical Provider, MD  fentaNYL (DURAGESIC - DOSED MCG/HR) 100 MCG/HR Place 100 mcg onto the skin every other day.   Yes Historical Provider, MD  fluticasone (FLONASE) 50 MCG/ACT nasal spray Place 2 sprays into the nose at bedtime as needed (for congestion.).    Yes Historical Provider, MD  guaiFENesin (MUCINEX) 600 MG 12 hr tablet Take 600 mg by mouth 2 (two) times daily as needed for cough or to loosen  phlegm (cough).    Yes Historical Provider, MD  hyoscyamine (LEVSIN SL) 0.125 MG SL tablet Place 0.125 mg under the tongue every 4 (four) hours as needed. Before meals as needed   Yes Historical Provider, MD  lidocaine (LIDODERM) 5 % Place 1 patch onto the skin daily as needed (for pain.).  01/03/15  Yes Historical Provider, MD  metoCLOPramide (REGLAN) 5 MG tablet Take 5 mg by mouth  4 (four) times daily as needed (stomach pain). Take 1 tablet 30 minutes before meals and at bedtime   Yes Historical Provider, MD  oxyCODONE (ROXICODONE) 15 MG immediate release tablet Take 15 mg by mouth every 4 (four) hours as needed for pain (pain).  12/01/14  Yes Historical Provider, MD  pantoprazole (PROTONIX) 40 MG tablet Take 40 mg by mouth 2 (two) times daily.    Yes Historical Provider, MD  predniSONE (DELTASONE) 5 MG tablet Take 5 mg by mouth daily as needed (inflammation).    Yes Historical Provider, MD  Prenatal Vit-Fe Fumarate-FA (MULTIVITAMIN-PRENATAL) 27-0.8 MG TABS Take 1 tablet by mouth daily.     Yes Historical Provider, MD  promethazine (PHENERGAN) 25 MG tablet Take 25 mg by mouth every 6 (six) hours as needed for nausea (nausea).  11/11/14  Yes Historical Provider, MD  simvastatin (ZOCOR) 10 MG tablet Take 10 mg by mouth at bedtime.     Yes Historical Provider, MD  Vitamin D, Ergocalciferol, (DRISDOL) 50000 UNITS CAPS Take 50,000 Units by mouth every 7 (seven) days.    Yes Historical Provider, MD  nitroGLYCERIN (NITROSTAT) 0.4 MG SL tablet Place 0.4 mg under the tongue every 5 (five) minutes as needed. For chest pain     Historical Provider, MD   There were no vitals taken for this visit. Physical Exam  Constitutional: She is oriented to person, place, and time. She appears well-developed and well-nourished. No distress.  HENT:  Mouth/Throat: Oropharynx is clear and moist.  Eyes: Conjunctivae are normal. No scleral icterus.  Neck: Neck supple. No tracheal deviation present.  Cardiovascular: Normal rate,  regular rhythm, normal heart sounds and intact distal pulses.   Pulmonary/Chest: Effort normal and breath sounds normal. No respiratory distress. She has no wheezes.  Abdominal: Soft. Normal appearance and bowel sounds are normal. She exhibits no distension. There is no tenderness.  Musculoskeletal: She exhibits no edema.  Neurological: She is alert and oriented to person, place, and time.  Skin: Skin is warm and dry. No rash noted. She is not diaphoretic.  Multiple yellow jacket sting sites noted. No stingers/fbs. No cellulitis.   Psychiatric: She has a normal mood and affect.  Nursing note and vitals reviewed.   ED Course  Procedures (including critical care time) Labs Review     MDM   Iv ns. Monitor. Pulse ox.  Pt took 2 benadryl pta. Ems gave zantac.   Solumedrol iv, pt requests med for pain, localized to bee sting sites - morphine iv.  Reviewed nursing notes and prior charts for additional history.   Recheck no change in exam from prior.    Pt indicates morphine doesn't help her pain and requests additional pain meds.  Dilaudid .5 mg iv.  Recheck pain improved.   Symptoms improved. No throat swelling, trouble breathing, wheezing or rash.  Pt currently appears stable for d/c.     Lajean Saver, MD 04/23/15 [phone]

## 2015-04-23 NOTE — ED Notes (Signed)
Bed: WA06 Expected date: 04/23/15 Expected time: 4:34 PM Means of arrival:  Comments: Allergic reaction "Yellow jacket" bites to arm

## 2015-05-24 ENCOUNTER — Encounter (HOSPITAL_COMMUNITY): Payer: Self-pay | Admitting: Emergency Medicine

## 2015-05-24 ENCOUNTER — Emergency Department (HOSPITAL_COMMUNITY): Payer: Medicare Other

## 2015-05-24 ENCOUNTER — Emergency Department (HOSPITAL_COMMUNITY)
Admission: EM | Admit: 2015-05-24 | Discharge: 2015-05-25 | Disposition: A | Discharge status: Home / Self Care | Payer: Medicare Other | Attending: Physician Assistant | Admitting: Physician Assistant

## 2015-05-24 DIAGNOSIS — J441 Chronic obstructive pulmonary disease with (acute) exacerbation: Secondary | ICD-10-CM | POA: Insufficient documentation

## 2015-05-24 DIAGNOSIS — I25119 Atherosclerotic heart disease of native coronary artery with unspecified angina pectoris: Secondary | ICD-10-CM | POA: Insufficient documentation

## 2015-05-24 DIAGNOSIS — R531 Weakness: Secondary | ICD-10-CM | POA: Insufficient documentation

## 2015-05-24 DIAGNOSIS — G20 Parkinson's disease: Secondary | ICD-10-CM | POA: Insufficient documentation

## 2015-05-24 DIAGNOSIS — E119 Type 2 diabetes mellitus without complications: Secondary | ICD-10-CM | POA: Insufficient documentation

## 2015-05-24 DIAGNOSIS — K219 Gastro-esophageal reflux disease without esophagitis: Secondary | ICD-10-CM | POA: Insufficient documentation

## 2015-05-24 DIAGNOSIS — N189 Chronic kidney disease, unspecified: Secondary | ICD-10-CM | POA: Insufficient documentation

## 2015-05-24 DIAGNOSIS — M810 Age-related osteoporosis without current pathological fracture: Secondary | ICD-10-CM | POA: Insufficient documentation

## 2015-05-24 DIAGNOSIS — Z7982 Long term (current) use of aspirin: Secondary | ICD-10-CM | POA: Insufficient documentation

## 2015-05-24 DIAGNOSIS — R5383 Other fatigue: Secondary | ICD-10-CM | POA: Diagnosis present

## 2015-05-24 DIAGNOSIS — R062 Wheezing: Secondary | ICD-10-CM

## 2015-05-24 DIAGNOSIS — Z79899 Other long term (current) drug therapy: Secondary | ICD-10-CM | POA: Diagnosis not present

## 2015-05-24 DIAGNOSIS — I129 Hypertensive chronic kidney disease with stage 1 through stage 4 chronic kidney disease, or unspecified chronic kidney disease: Secondary | ICD-10-CM | POA: Diagnosis not present

## 2015-05-24 DIAGNOSIS — G8929 Other chronic pain: Secondary | ICD-10-CM | POA: Insufficient documentation

## 2015-05-24 DIAGNOSIS — Z720 Tobacco use: Secondary | ICD-10-CM | POA: Diagnosis not present

## 2015-05-24 DIAGNOSIS — Z86718 Personal history of other venous thrombosis and embolism: Secondary | ICD-10-CM | POA: Insufficient documentation

## 2015-05-24 DIAGNOSIS — F329 Major depressive disorder, single episode, unspecified: Secondary | ICD-10-CM | POA: Insufficient documentation

## 2015-05-24 DIAGNOSIS — Z9861 Coronary angioplasty status: Secondary | ICD-10-CM | POA: Diagnosis not present

## 2015-05-24 DIAGNOSIS — M1990 Unspecified osteoarthritis, unspecified site: Secondary | ICD-10-CM | POA: Diagnosis not present

## 2015-05-24 LAB — I-STAT TROPONIN, ED: Troponin i, poc: 0 ng/mL (ref 0.00–0.08)

## 2015-05-24 LAB — BASIC METABOLIC PANEL
Anion gap: 10 (ref 5–15)
BUN: 19 mg/dL (ref 6–20)
CO2: 23 mmol/L (ref 22–32)
Calcium: 9.1 mg/dL (ref 8.9–10.3)
Chloride: 106 mmol/L (ref 101–111)
Creatinine, Ser: 1.12 mg/dL — ABNORMAL HIGH (ref 0.44–1.00)
GFR calc Af Amer: 56 mL/min — ABNORMAL LOW (ref >60)
GFR calc non Af Amer: 49 mL/min — ABNORMAL LOW (ref >60)
Glucose, Bld: 107 mg/dL — ABNORMAL HIGH (ref 65–99)
Potassium: 5 mmol/L (ref 3.5–5.1)
Sodium: 139 mmol/L (ref 135–145)

## 2015-05-24 LAB — CBC WITH DIFFERENTIAL/PLATELET
Basophils Absolute: 0 10*3/uL (ref 0.0–0.1)
Basophils Relative: 0 % (ref 0–1)
Eosinophils Absolute: 0 10*3/uL (ref 0.0–0.7)
Eosinophils Relative: 0 % (ref 0–5)
HCT: 40.1 % (ref 36.0–46.0)
Hemoglobin: 13.3 g/dL (ref 12.0–15.0)
Lymphocytes Relative: 9 % — ABNORMAL LOW (ref 12–46)
Lymphs Abs: 0.6 10*3/uL — ABNORMAL LOW (ref 0.7–4.0)
MCH: 31.6 pg (ref 26.0–34.0)
MCHC: 33.2 g/dL (ref 30.0–36.0)
MCV: 95.2 fL (ref 78.0–100.0)
Monocytes Absolute: 0.1 10*3/uL (ref 0.1–1.0)
Monocytes Relative: 2 % — ABNORMAL LOW (ref 3–12)
Neutro Abs: 5.7 10*3/uL (ref 1.7–7.7)
Neutrophils Relative %: 89 % — ABNORMAL HIGH (ref 43–77)
Platelets: 249 10*3/uL (ref 150–400)
RBC: 4.21 MIL/uL (ref 3.87–5.11)
RDW: 13.6 % (ref 11.5–15.5)
WBC: 6.4 10*3/uL (ref 4.0–10.5)

## 2015-05-24 LAB — URINALYSIS, ROUTINE W REFLEX MICROSCOPIC
Bilirubin Urine: NEGATIVE
Glucose, UA: NEGATIVE mg/dL
Hgb urine dipstick: NEGATIVE
Ketones, ur: 15 mg/dL — AB
Leukocytes, UA: NEGATIVE
Nitrite: NEGATIVE
Protein, ur: NEGATIVE mg/dL
Specific Gravity, Urine: 1.011 (ref 1.005–1.030)
Urobilinogen, UA: 0.2 mg/dL (ref 0.0–1.0)
pH: 5 (ref 5.0–8.0)

## 2015-05-24 MED ORDER — ALBUTEROL SULFATE (2.5 MG/3ML) 0.083% IN NEBU
5.0000 mg | INHALATION_SOLUTION | Freq: Once | RESPIRATORY_TRACT | Status: AC
  Administered 2015-05-24: 5 mg via RESPIRATORY_TRACT
  Filled 2015-05-24: qty 6

## 2015-05-24 NOTE — ED Notes (Signed)
Bed: [facility] Expected date:  Expected time:  Means of arrival:  Comments: EMS/74F/HTN/non-compliant with meds

## 2015-05-24 NOTE — ED Notes (Signed)
Pt ambulated to the bathroom and back with stand by assistance

## 2015-05-24 NOTE — ED Notes (Signed)
Pharmacy in with patient

## 2015-05-24 NOTE — ED Notes (Signed)
Patient states " I am out of my oxy, I have my fentanyl patch on."

## 2015-05-24 NOTE — ED Notes (Addendum)
Per ems pt from home,  Co gl weakness and fatigue, per ems per pt she has not been eating well and taking her meds regularly for the past month. Pt is normally ambulatory yet she uses cane now due to weakness. Pt is alert and oriented x 4. Pt denies new onset pain yet co chronic back pain.

## 2015-05-24 NOTE — ED Provider Notes (Signed)
CSN: [id number]     Arrival date & time 05/24/15  2010 History   First MD Initiated Contact with Patient 05/24/15 2039     Chief Complaint  Patient presents with  . Fatigue     (Consider location/radiation/quality/duration/timing/severity/associated sxs/prior Treatment) The history is provided by the patient and medical records.    69 year old female with history of asthma, hypertension, fibromyalgia, depression, COPD , coronary artery disease, chronic kidney disease, diabetes, presenting to the ED for generalized weakness and fatigue. Patient states this is been ongoing for the past month, but has gotten worse over the past couple of days.   She states she has chronic back pain, denies that this is worse than normal. She denies any chest pain or shortness of breath. No abdominal pain, nausea, or vomiting. She does admit to decreased appetite because she does not feel hungry. No reported fever or chills. Patient states weakness was worse today, she is now relying on cane to walk. She states usually she is able to walk unassisted at baseline. Patient lives at home with a 24 hour home health aide. She denies any changes in her medications. She takes oxycodone and fentanyl for her chronic pain. Of note, patient with reported 37 pound unintentional weight loss over the past few months.  VSS.  Past Medical History  Diagnosis Date  . Asthma   . HTN (hypertension)   . Grave's disease   . Fibromyalgia   . Thrombophlebitis   . Depression   . Angina   . Shortness of breath   . COPD (chronic obstructive pulmonary disease)   . Sleep apnea   . Blood dyscrasia     hx of thrombphlebitis  . Recurrent upper respiratory infection (URI)   . GERD (gastroesophageal reflux disease)   . Headache(784.0)   . Arthritis   . CAD (coronary artery disease)   . Chronic kidney disease   . Peripheral vascular disease   . Diabetes mellitus   . Ulcer   . Diarrhea     chronic   . Thyroid disorder   . Anxiety and  depression   . Chronic pain     leg and feet  . DDD (degenerative disc disease)   . Rhinitis   . OP (osteoporosis)   . Vitamin B 12 deficiency   . PUD (peptic ulcer disease)   . DDD (degenerative disc disease)   . Spinal stenosis   . Spinal stenosis of lumbar region 04/23/2013  . Tobacco use disorder 04/23/2013  . DVT (deep venous thrombosis)   . Carotid artery occlusion   . Parkinson's disease   . Dry mouth    Past Surgical History  Procedure Laterality Date  . Gastrectomy      age 38   Part of small intestin and part of stomach  . Cholecystectomy  1999  . Carotid endarterectomy Left Sept. 20,2011    cea  . Left heart catheterization with coronary angiogram N/A 10/02/2011    Procedure: LEFT HEART CATHETERIZATION WITH CORONARY ANGIOGRAM;  Surgeon: Sueanne Margarita, MD;  Location: [place] [facility];  Service: Cardiovascular;  Laterality: N/A;   Family History  Problem Relation Age of Onset  . Diabetes Mother   . Hyperlipidemia Mother   . Heart attack Mother   . Other Mother     varicose veins,respiratory,stroke  . Heart disease Mother     before age 26  . Hypertension Mother   . Varicose Veins Mother   . Diabetes Father   . Heart  disease Father     before age 40  . Hyperlipidemia Father   . Heart attack Father   . Other Father     varicose veins  . Hypertension Father   . Varicose Veins Father   . Diabetes Sister   . Heart disease Sister     before age 66  . Hyperlipidemia Sister   . Heart attack Sister   . Other Sister     varicose veins  . Hypertension Sister   . Varicose Veins Sister   . Peripheral vascular disease Sister   . Diabetes Sister   . Hyperlipidemia Sister   . Hypertension Sister   . Varicose Veins Sister    History  Substance Use Topics  . Smoking status: Current Every Day Smoker -- 2.00 packs/day for 50 years    Types: Cigarettes  . Smokeless tobacco: Never Used     Comment: cessation info given and reviewed  . Alcohol Use: No   OB History     No data available     Review of Systems  Constitutional: Positive for fatigue.  Neurological: Positive for weakness.  All other systems reviewed and are negative.     Allergies  Clarithromycin and Bee venom  Home Medications   Prior to Admission medications   Medication Sig Start Date End Date Taking? Authorizing Provider  acetaminophen (TYLENOL) 325 MG tablet Take 650 mg by mouth every 6 (six) hours as needed for moderate pain (pain).    Historical Provider, MD  albuterol-ipratropium (COMBIVENT) 18-103 MCG/ACT inhaler Inhale 2 puffs into the lungs every 6 (six) hours as needed for shortness of breath (sob). For shortness of breath    Historical Provider, MD  amLODipine (NORVASC) 5 MG tablet Take 5 mg by mouth daily.      Historical Provider, MD  aspirin 81 MG tablet Take 81 mg by mouth daily.      Historical Provider, MD  calcium acetate (PHOSLO) 667 MG capsule Take 667 mg by mouth daily.     Historical Provider, MD  clonazePAM (KLONOPIN) 1 MG tablet Take 1 mg by mouth 2 (two) times daily as needed for anxiety (anxiety). For anxiety    Historical Provider, MD  cyclobenzaprine (FLEXERIL) 5 MG tablet Take 5 mg by mouth daily as needed for muscle spasms (muscle spasms).  10/30/14   Historical Provider, MD  dicyclomine (BENTYL) 20 MG tablet Take 20 mg by mouth every 6 (six) hours.     Historical Provider, MD  diphenhydrAMINE (BENADRYL) 25 MG tablet Take 50 mg by mouth every 6 (six) hours as needed (allergic reaction).    Historical Provider, MD  DULoxetine (CYMBALTA) 60 MG capsule Take 60 mg by mouth daily.      Historical Provider, MD  fentaNYL (DURAGESIC - DOSED MCG/HR) 100 MCG/HR Place 100 mcg onto the skin every other day.    Historical Provider, MD  fluticasone (FLONASE) 50 MCG/ACT nasal spray Place 2 sprays into the nose at bedtime as needed (for congestion.).     Historical Provider, MD  guaiFENesin (MUCINEX) 600 MG 12 hr tablet Take 600 mg by mouth 2 (two) times daily as needed for  cough or to loosen phlegm (cough).     Historical Provider, MD  HYDROcodone-acetaminophen (NORCO/VICODIN) 5-325 MG per tablet Take 1-2 tablets by mouth every 6 (six) hours as needed for moderate pain. 04/23/15   Lajean Saver, MD  hyoscyamine (LEVSIN SL) 0.125 MG SL tablet Place 0.125 mg under the tongue every 4 (four) hours as  needed. Before meals as needed    Historical Provider, MD  lidocaine (LIDODERM) 5 % Place 1 patch onto the skin daily as needed (for pain.).  01/03/15   Historical Provider, MD  metoCLOPramide (REGLAN) 5 MG tablet Take 5 mg by mouth 4 (four) times daily as needed (stomach pain). Take 1 tablet 30 minutes before meals and at bedtime    Historical Provider, MD  nitroGLYCERIN (NITROSTAT) 0.4 MG SL tablet Place 0.4 mg under the tongue every 5 (five) minutes as needed. For chest pain     Historical Provider, MD  oxyCODONE (ROXICODONE) 15 MG immediate release tablet Take 15 mg by mouth every 4 (four) hours as needed for pain (pain).  12/01/14   Historical Provider, MD  pantoprazole (PROTONIX) 40 MG tablet Take 40 mg by mouth 2 (two) times daily.     Historical Provider, MD  predniSONE (DELTASONE) 5 MG tablet Take 5 mg by mouth daily as needed (inflammation).     Historical Provider, MD  Prenatal Vit-Fe Fumarate-FA (MULTIVITAMIN-PRENATAL) 27-0.8 MG TABS Take 1 tablet by mouth daily.      Historical Provider, MD  promethazine (PHENERGAN) 25 MG tablet Take 25 mg by mouth every 6 (six) hours as needed for nausea (nausea).  11/11/14   Historical Provider, MD  simvastatin (ZOCOR) 10 MG tablet Take 10 mg by mouth at bedtime.      Historical Provider, MD  Vitamin D, Ergocalciferol, (DRISDOL) 50000 UNITS CAPS Take 50,000 Units by mouth every 7 (seven) days.     Historical Provider, MD   BP 179/83 mmHg  Pulse 83  Temp(Src) 98.9 F (37.2 C) (Oral)  Resp 16  SpO2 98%   Physical Exam  Constitutional: She is oriented to person, place, and time. She appears well-developed and well-nourished.  HENT:   Head: Normocephalic and atraumatic.  Mouth/Throat: Oropharynx is clear and moist.  Eyes: Conjunctivae and EOM are normal. Pupils are equal, round, and reactive to light.  Neck: Normal range of motion.  Cardiovascular: Normal rate, regular rhythm and normal heart sounds.   Pulmonary/Chest: Effort normal. She has wheezes. She has rhonchi.   Expiratory wheezes and rhonchi throughout, sounds chronic in nature, no distress  Abdominal: Soft. Bowel sounds are normal.  Musculoskeletal: Normal range of motion.  Neurological: She is alert and oriented to person, place, and time.  Awake, alert, oriented; moving all extremities well  Skin: Skin is warm and dry.  Psychiatric: She has a normal mood and affect.  Nursing note and vitals reviewed.   ED Course  Procedures (including critical care time) Labs Review Labs Reviewed  CBC WITH DIFFERENTIAL/PLATELET - Abnormal; Notable for the following:    Neutrophils Relative % 89 (*)    Lymphocytes Relative 9 (*)    Lymphs Abs 0.6 (*)    Monocytes Relative 2 (*)    All other components within normal limits  BASIC METABOLIC PANEL - Abnormal; Notable for the following:    Glucose, Bld 107 (*)    Creatinine, Ser 1.12 (*)    GFR calc non Af Amer 49 (*)    GFR calc Af Amer 56 (*)    All other components within normal limits  URINALYSIS, ROUTINE W REFLEX MICROSCOPIC (NOT AT [facility]) - Abnormal; Notable for the following:    Ketones, ur 15 (*)    All other components within normal limits  I-STAT TROPOININ, ED    Imaging Review Dg Chest 2 View  05/24/2015   CLINICAL DATA:  Generalized weakness. Wheezing. Body aches.  Symptoms for 3 days.  EXAM: CHEST  2 VIEW  COMPARISON:  11/22/2011  FINDINGS: The cardiomediastinal contours are normal. Borderline hyperinflation with mild bronchitic change. Linear atelectasis or scarring at the left greater than right lung base. Pulmonary vasculature is normal. No consolidation, pleural effusion, or pneumothorax. No acute  osseous abnormalities are seen. The bones appear under mineralized.  IMPRESSION: No acute pulmonary process.   Electronically Signed   By: Jeb Levering M.D.   On: 05/24/2015 22:12     EKG Interpretation   Date/Time:  Tuesday May 24 2015 21:26:19 EDT Ventricular Rate:  103 PR Interval:  147 QRS Duration: 84 QT Interval:  372 QTC Calculation: 487 R Axis:   -16 Text Interpretation:  Sinus tachycardia Borderline left axis deviation  Anterior infarct, old Baseline wander in lead(s) V1 sharp ts laterally. no  acute ischemia.  Confirmed by Gerald Leitz ([id number]) on 05/24/2015  9:41:02 PM      MDM   Final diagnoses:  Wheezing  Generalized weakness   69 year old female with general weakness and fatigue. This is been progressive over the past month.   She does report 37 pound weight loss over the past few months as well. Patient is afebrile, nontoxic. She does not appear to be in any distress. Her vital signs are stable. EKG without acute ischemic changes.  Lab work appears baseline for patient when compared with prior values. UA without signs of infection. Chest x-ray is clear.  Patient was able to ambulate in the ED with standby assistance, using cane.   At this time, no emergent condition requiring hospitalization at this time. I did have concern for patient's reported weight loss , however feel this can be worked up by her PCP on an outpatient basis. She does have an appointment with her primary care physician later on this week, I have encouraged her to keep this appointment.  Discussed plan with patient, he/she acknowledged understanding and agreed with plan of care.  Return precautions given for new or worsening symptoms.  Case discussed with attending physician, Dr. Thomasene Lot, who agrees with assessment and plan of care.  Larene Pickett, PA-C 05/25/15 0028  Courteney Julio Alm, MD 05/28/15 [phone]

## 2015-05-24 NOTE — ED Provider Notes (Signed)
Medical screening examination/treatment/procedure(s) were conducted as a shared visit with non-physician practitioner(s) and myself.  I personally evaluated the patient during the encounter.  I saw patient with APP and agree with the assessment.   Patient is presenting with fatigue and pain. Patient's been feeling worse in the last couple days. Patient has a 37 pound weight loss in the last couple months. She is a point with her primary care provider later this week. Patient has rhonchi wheezing bilateral lungs, heavy smoker. We will get workup given patient's age. Including UA CBC Chem-7 EKG.    EKG Interpretation  Date/Time:  Tuesday May 24 2015 21:26:19 EDT Ventricular Rate:  103 PR Interval:  147 QRS Duration: 84 QT Interval:  372 QTC Calculation: 487 R Axis:   -16 Text Interpretation:  Sinus tachycardia Borderline left axis deviation Anterior infarct, old Baseline wander in lead(s) V1 sharp ts laterally. no acute ischemia.  Confirmed by Gerald Leitz ([id number]) on 05/24/2015 9:41:02 PM           [place], MD 05/24/15 2141

## 2015-05-24 NOTE — Discharge Instructions (Signed)
Your work-up today was normal. Please follow-up with your primary care physician later this week as scheduled. Return here for any new/worsening symptoms.

## 2015-05-25 NOTE — ED Notes (Signed)
Pt is trying to get in touch with her home health aid to get a ride home.

## 2015-07-05 ENCOUNTER — Encounter: Payer: Self-pay | Admitting: Vascular Surgery

## 2015-07-06 ENCOUNTER — Other Ambulatory Visit: Payer: Self-pay | Admitting: Family

## 2015-07-06 ENCOUNTER — Ambulatory Visit (INDEPENDENT_AMBULATORY_CARE_PROVIDER_SITE_OTHER): Payer: Medicare Other | Admitting: Vascular Surgery

## 2015-07-06 ENCOUNTER — Ambulatory Visit (HOSPITAL_COMMUNITY)
Admission: RE | Admit: 2015-07-06 | Discharge: 2015-07-06 | Disposition: A | Discharge status: Home / Self Care | Payer: Medicare Other | Source: Ambulatory Visit | Attending: Vascular Surgery | Admitting: Vascular Surgery

## 2015-07-06 ENCOUNTER — Encounter: Payer: Self-pay | Admitting: Vascular Surgery

## 2015-07-06 VITALS — BP 153/75 | HR 92 | Resp 14 | Ht 64.0 in | Wt 99.0 lb

## 2015-07-06 DIAGNOSIS — N189 Chronic kidney disease, unspecified: Secondary | ICD-10-CM | POA: Diagnosis not present

## 2015-07-06 DIAGNOSIS — I6523 Occlusion and stenosis of bilateral carotid arteries: Secondary | ICD-10-CM

## 2015-07-06 DIAGNOSIS — I6521 Occlusion and stenosis of right carotid artery: Secondary | ICD-10-CM

## 2015-07-06 DIAGNOSIS — I129 Hypertensive chronic kidney disease with stage 1 through stage 4 chronic kidney disease, or unspecified chronic kidney disease: Secondary | ICD-10-CM | POA: Insufficient documentation

## 2015-07-06 DIAGNOSIS — F17200 Nicotine dependence, unspecified, uncomplicated: Secondary | ICD-10-CM

## 2015-07-06 DIAGNOSIS — Z48812 Encounter for surgical aftercare following surgery on the circulatory system: Secondary | ICD-10-CM

## 2015-07-06 NOTE — Progress Notes (Signed)
[facility] [place]  Patient name: [name] MRN: [id number] DOB: 06-18-1946 Sex: female  REASON FOR VISIT: 6 month follow up visit  HPI: [name] is a 69 y.o. female who underwent a left carotid endarterectomy in September 2011. I have been following a moderate right carotid stenosis. She comes in for a 6 month follow up visit. Since I saw her last her main complaint is severe back pain related to spinal stenosis. She also has pain in both legs especially on the left related to her degenerative disc disease of her back. She has apparently had some problems with pain management and they have stopped prescribing medication for her. She is quite upset today and obviously in pain.  I do not get any history of TIAs, stroke, expressive or receptive aphasia, or amaurosis fugax.  Unfortunately, she continues to smoke 2 packs per day.  Past Medical History  Diagnosis Date  . Asthma   . HTN (hypertension)   . Grave's disease   . Fibromyalgia   . Thrombophlebitis   . Depression   . Angina   . Shortness of breath   . COPD (chronic obstructive pulmonary disease)   . Sleep apnea   . Blood dyscrasia     hx of thrombphlebitis  . Recurrent upper respiratory infection (URI)   . GERD (gastroesophageal reflux disease)   . Headache(784.0)   . Arthritis   . CAD (coronary artery disease)   . Chronic kidney disease   . Peripheral vascular disease   . Diabetes mellitus   . Ulcer   . Diarrhea     chronic   . Thyroid disorder   . Anxiety and depression   . Chronic pain     leg and feet  . DDD (degenerative disc disease)   . Rhinitis   . OP (osteoporosis)   . Vitamin B 12 deficiency   . PUD (peptic ulcer disease)   . DDD (degenerative disc disease)   . Spinal stenosis   . Spinal stenosis of lumbar region 04/23/2013  . Tobacco use disorder 04/23/2013  . DVT (deep venous thrombosis)   . Carotid artery occlusion   . Parkinson's disease   . Dry mouth     Family History  Problem Relation Age of Onset  . Diabetes Mother   . Hyperlipidemia Mother   . Heart attack Mother   . Other Mother     varicose veins,respiratory,stroke  . Heart disease Mother     before age 29  . Hypertension Mother   . Varicose Veins Mother   . Diabetes Father   . Heart disease Father     before age 74  . Hyperlipidemia Father   . Heart attack Father   . Other Father     varicose veins  . Hypertension Father   . Varicose Veins Father   . Diabetes Sister   . Heart disease Sister     before age 52  . Hyperlipidemia Sister   . Heart attack Sister   . Other Sister     varicose veins  . Hypertension Sister   . Varicose Veins Sister   . Peripheral vascular disease Sister   . Diabetes Sister   . Hyperlipidemia Sister   . Hypertension Sister   . Varicose Veins Sister    SOCIAL HISTORY: Social History  Substance Use Topics  . Smoking status: Current Every Day Smoker -- 2.00 packs/day for 50 years    Types: Cigarettes  .  Smokeless tobacco: Never Used     Comment: cessation info given and reviewed  . Alcohol Use: No   Allergies  Allergen Reactions  . Clarithromycin Nausea And Vomiting and Nausea Only  . Bee Venom    Current Outpatient Prescriptions  Medication Sig Dispense Refill  . acetaminophen (TYLENOL) 325 MG tablet Take 650 mg by mouth every 6 (six) hours as needed for moderate pain (pain).    [name] albuterol-ipratropium (COMBIVENT) 18-103 MCG/ACT inhaler Inhale 2 puffs into the lungs every 6 (six) hours as needed for shortness of breath (sob). For shortness of breath    . amLODipine (NORVASC) 5 MG tablet Take 5 mg by mouth daily.      [name] aspirin 81 MG tablet Take 81 mg by mouth daily.      . calcium acetate (PHOSLO) 667 MG capsule Take 667 mg by mouth 3 (three) times daily with meals.     . clonazePAM (KLONOPIN) 1 MG tablet Take 1 mg by mouth 2 (two) times daily as needed for anxiety (anxiety). For anxiety    . cyclobenzaprine (FLEXERIL) 5 MG  tablet Take 5 mg by mouth daily as needed for muscle spasms (muscle spasms).     . dicyclomine (BENTYL) 20 MG tablet Take 20 mg by mouth every 6 (six) hours.     . DULoxetine (CYMBALTA) 60 MG capsule Take 60 mg by mouth daily.      . fentaNYL (DURAGESIC - DOSED MCG/HR) 100 MCG/HR Place 100 mcg onto the skin every other day.    . metoCLOPramide (REGLAN) 5 MG tablet Take 5 mg by mouth 4 (four) times daily as needed (stomach pain). Take 1 tablet 30 minutes before meals and at bedtime    . nitroGLYCERIN (NITROSTAT) 0.4 MG SL tablet Place 0.4 mg under the tongue every 5 (five) minutes as needed. For chest pain     . oxyCODONE (ROXICODONE) 15 MG immediate release tablet Take 15 mg by mouth every 4 (four) hours as needed for pain (pain).     . pantoprazole (PROTONIX) 40 MG tablet Take 40 mg by mouth 2 (two) times daily.     . predniSONE (DELTASONE) 5 MG tablet Take 5 mg by mouth daily as needed (inflammation).     . promethazine (PHENERGAN) 25 MG tablet Take 25 mg by mouth every 6 (six) hours as needed for nausea (nausea).     . simvastatin (ZOCOR) 10 MG tablet Take 10 mg by mouth at bedtime.      . Vitamin D, Ergocalciferol, (DRISDOL) 50000 UNITS CAPS Take 50,000 Units by mouth every 7 (seven) days.     [name] HYDROcodone-acetaminophen (NORCO/VICODIN) 5-325 MG per tablet Take 1-2 tablets by mouth every 6 (six) hours as needed for moderate pain. (Patient not taking: Reported on 07/06/2015) 15 tablet 0   No current facility-administered medications for this visit.   REVIEW OF SYSTEMS: Valu.Nieves ] denotes positive finding; [  ] denotes negative finding  CARDIOVASCULAR:  '[ ]'$  chest pain   '[ ]'$  chest pressure   '[ ]'$  palpitations   '[ ]'$  orthopnea   Valu.Nieves ] dyspnea on exertion   '[ ]'$  claudication   '[ ]'$  rest pain   '[ ]'$  DVT   '[ ]'$  phlebitis PULMONARY:   '[ ]'$  productive cough   Valu.Nieves ] asthma   Valu.Nieves ] wheezing NEUROLOGIC:   Valu.Nieves ] weakness both lower extremities Valu.Nieves ] paresthesias both lower extremities  '[ ]'$  aphasia  '[ ]'$  amaurosis  Valu.Nieves ]  dizziness  HEMATOLOGIC:   '[ ]'$  bleeding problems   '[ ]'$  clotting disorders MUSCULOSKELETAL:  '[ ]'$  joint pain   '[ ]'$  joint swelling '[X]'$  leg swelling GASTROINTESTINAL: '[ ]'$   blood in stool  '[ ]'$   hematemesis GENITOURINARY:  Valu.Nieves ]  dysuria  '[ ]'$   hematuria PSYCHIATRIC:  '[ ]'$  history of major depression INTEGUMENTARY:  '[ ]'$  rashes  '[ ]'$  ulcers CONSTITUTIONAL:  '[ ]'$  fever   '[ ]'$  chills  PHYSICAL EXAM: Filed Vitals:   07/06/15 1451 07/06/15 1452 07/06/15 1453  BP: 146/76 151/73 153/75  Pulse: 91 92 92  Resp: 14    Height: '5\' 4"'$  (1.626 m)    Weight: 99 lb (44.906 kg)    SpO2: 100%     GENERAL: The patient is a well-nourished female, in no acute distress. The vital signs are documented above. CARDIAC: There is a regular rate and rhythm.  VASCULAR: I do not detect carotid bruits. Both feet are warm and well perfused. PULMONARY: There is good air exchange bilaterally without wheezing or rales. ABDOMEN: Soft and non-tender with normal pitched bowel sounds.  MUSCULOSKELETAL: There are no major deformities or cyanosis. NEUROLOGIC: No focal weakness or paresthesias are detected. SKIN: There are no ulcers or rashes noted. PSYCHIATRIC: The patient has a normal affect.  DATA:   CAROTID DUPLEX: I have independently interpreted the carotid duplex scan today which shows that the right carotid stenosis is stable and is in the 60-79% range. The left carotid endarterectomy site is widely patent with mild hyperplasia in the mid to distal patch with a stenosis of less than 40%.  MEDICAL ISSUES:  CAROTID DISEASE:  She has an asymptomatic 60-79% right carotid stenosis. There is no significant recurrent stenosis on the left for she has had surgery. We have discussed the importance of tobacco cessation. She is on aspirin and is on a statin. She understands we would not consider carotid endarterectomy and lesser stenosis progressed to greater than 80% or she develops new right hemispheric symptoms. I were to follow carotid  duplex scan in 6 months and I'll see her back at that time. She knows to call sooner if she has problems.  SEVERE BACK PAIN RELATED TO DEGENERATIVE DISC DISEASE AND SPINAL STENOSIS: I have encouraged her to follow up with the pain care Center given her severe pain.   Return in about 6 months (around 01/03/2016).  Deitra Mayo [facility] [place]: [phone]

## 2015-07-06 NOTE — Progress Notes (Signed)
Filed Vitals:   07/06/15 1451 07/06/15 1452 07/06/15 1453  BP: 146/76 151/73 153/75  Pulse: 91 92 92  Resp: 14    Height: '5\' 4"'$  (1.626 m)    Weight: 99 lb (44.906 kg)    SpO2: 100%

## 2015-07-06 NOTE — Addendum Note (Signed)
Addended by: Dorthula Rue L on: 07/06/2015 04:59 PM   Modules accepted: Orders

## 2015-07-28 ENCOUNTER — Emergency Department (HOSPITAL_COMMUNITY): Payer: Medicare Other

## 2015-07-28 ENCOUNTER — Inpatient Hospital Stay (HOSPITAL_COMMUNITY)
Admission: EM | Admit: 2015-07-28 | Discharge: 2015-07-30 | DRG: 690 | Disposition: A | Discharge status: Home Health | Payer: Medicare Other | Attending: Internal Medicine | Admitting: Internal Medicine

## 2015-07-28 ENCOUNTER — Encounter (HOSPITAL_COMMUNITY): Payer: Self-pay | Admitting: *Deleted

## 2015-07-28 DIAGNOSIS — T501X5A Adverse effect of loop [high-ceiling] diuretics, initial encounter: Secondary | ICD-10-CM | POA: Diagnosis present

## 2015-07-28 DIAGNOSIS — Z8672 Personal history of thrombophlebitis: Secondary | ICD-10-CM

## 2015-07-28 DIAGNOSIS — G20 Parkinson's disease: Secondary | ICD-10-CM | POA: Diagnosis present

## 2015-07-28 DIAGNOSIS — I739 Peripheral vascular disease, unspecified: Secondary | ICD-10-CM | POA: Diagnosis present

## 2015-07-28 DIAGNOSIS — Z79891 Long term (current) use of opiate analgesic: Secondary | ICD-10-CM

## 2015-07-28 DIAGNOSIS — R296 Repeated falls: Secondary | ICD-10-CM

## 2015-07-28 DIAGNOSIS — N390 Urinary tract infection, site not specified: Secondary | ICD-10-CM | POA: Diagnosis not present

## 2015-07-28 DIAGNOSIS — E0500 Thyrotoxicosis with diffuse goiter without thyrotoxic crisis or storm: Secondary | ICD-10-CM | POA: Diagnosis present

## 2015-07-28 DIAGNOSIS — Z7952 Long term (current) use of systemic steroids: Secondary | ICD-10-CM

## 2015-07-28 DIAGNOSIS — Z881 Allergy status to other antibiotic agents status: Secondary | ICD-10-CM

## 2015-07-28 DIAGNOSIS — M79673 Pain in unspecified foot: Secondary | ICD-10-CM | POA: Diagnosis present

## 2015-07-28 DIAGNOSIS — K219 Gastro-esophageal reflux disease without esophagitis: Secondary | ICD-10-CM | POA: Insufficient documentation

## 2015-07-28 DIAGNOSIS — I10 Essential (primary) hypertension: Secondary | ICD-10-CM

## 2015-07-28 DIAGNOSIS — Z8711 Personal history of peptic ulcer disease: Secondary | ICD-10-CM

## 2015-07-28 DIAGNOSIS — N189 Chronic kidney disease, unspecified: Secondary | ICD-10-CM | POA: Diagnosis present

## 2015-07-28 DIAGNOSIS — A419 Sepsis, unspecified organism: Secondary | ICD-10-CM

## 2015-07-28 DIAGNOSIS — E1149 Type 2 diabetes mellitus with other diabetic neurological complication: Secondary | ICD-10-CM

## 2015-07-28 DIAGNOSIS — Z86718 Personal history of other venous thrombosis and embolism: Secondary | ICD-10-CM

## 2015-07-28 DIAGNOSIS — M79606 Pain in leg, unspecified: Secondary | ICD-10-CM | POA: Diagnosis present

## 2015-07-28 DIAGNOSIS — F32A Depression, unspecified: Secondary | ICD-10-CM | POA: Insufficient documentation

## 2015-07-28 DIAGNOSIS — I129 Hypertensive chronic kidney disease with stage 1 through stage 4 chronic kidney disease, or unspecified chronic kidney disease: Secondary | ICD-10-CM | POA: Diagnosis present

## 2015-07-28 DIAGNOSIS — E785 Hyperlipidemia, unspecified: Secondary | ICD-10-CM | POA: Insufficient documentation

## 2015-07-28 DIAGNOSIS — F809 Developmental disorder of speech and language, unspecified: Secondary | ICD-10-CM

## 2015-07-28 DIAGNOSIS — F419 Anxiety disorder, unspecified: Secondary | ICD-10-CM | POA: Diagnosis present

## 2015-07-28 DIAGNOSIS — G8929 Other chronic pain: Secondary | ICD-10-CM | POA: Diagnosis present

## 2015-07-28 DIAGNOSIS — R6510 Systemic inflammatory response syndrome (SIRS) of non-infectious origin without acute organ dysfunction: Secondary | ICD-10-CM | POA: Diagnosis present

## 2015-07-28 DIAGNOSIS — M4806 Spinal stenosis, lumbar region: Secondary | ICD-10-CM | POA: Diagnosis present

## 2015-07-28 DIAGNOSIS — J45909 Unspecified asthma, uncomplicated: Secondary | ICD-10-CM | POA: Diagnosis present

## 2015-07-28 DIAGNOSIS — J449 Chronic obstructive pulmonary disease, unspecified: Secondary | ICD-10-CM

## 2015-07-28 DIAGNOSIS — N179 Acute kidney failure, unspecified: Secondary | ICD-10-CM

## 2015-07-28 DIAGNOSIS — M810 Age-related osteoporosis without current pathological fracture: Secondary | ICD-10-CM | POA: Diagnosis present

## 2015-07-28 DIAGNOSIS — Z7982 Long term (current) use of aspirin: Secondary | ICD-10-CM

## 2015-07-28 DIAGNOSIS — Z91030 Bee allergy status: Secondary | ICD-10-CM

## 2015-07-28 DIAGNOSIS — F17210 Nicotine dependence, cigarettes, uncomplicated: Secondary | ICD-10-CM | POA: Diagnosis present

## 2015-07-28 DIAGNOSIS — I2510 Atherosclerotic heart disease of native coronary artery without angina pectoris: Secondary | ICD-10-CM | POA: Diagnosis present

## 2015-07-28 DIAGNOSIS — E538 Deficiency of other specified B group vitamins: Secondary | ICD-10-CM | POA: Diagnosis present

## 2015-07-28 DIAGNOSIS — M797 Fibromyalgia: Secondary | ICD-10-CM | POA: Diagnosis present

## 2015-07-28 DIAGNOSIS — Z79899 Other long term (current) drug therapy: Secondary | ICD-10-CM

## 2015-07-28 DIAGNOSIS — F329 Major depressive disorder, single episode, unspecified: Secondary | ICD-10-CM | POA: Diagnosis present

## 2015-07-28 DIAGNOSIS — G4730 Sleep apnea, unspecified: Secondary | ICD-10-CM | POA: Diagnosis present

## 2015-07-28 DIAGNOSIS — J310 Chronic rhinitis: Secondary | ICD-10-CM | POA: Diagnosis present

## 2015-07-28 LAB — CBC
HCT: 40.2 % (ref 36.0–46.0)
Hemoglobin: 13.2 g/dL (ref 12.0–15.0)
MCH: 31.7 pg (ref 26.0–34.0)
MCHC: 32.8 g/dL (ref 30.0–36.0)
MCV: 96.6 fL (ref 78.0–100.0)
Platelets: 224 10*3/uL (ref 150–400)
RBC: 4.16 MIL/uL (ref 3.87–5.11)
RDW: 14.3 % (ref 11.5–15.5)
WBC: 8.9 10*3/uL (ref 4.0–10.5)

## 2015-07-28 LAB — URINALYSIS, ROUTINE W REFLEX MICROSCOPIC
Bilirubin Urine: NEGATIVE
Glucose, UA: NEGATIVE mg/dL
Ketones, ur: NEGATIVE mg/dL
Nitrite: POSITIVE — AB
Protein, ur: 30 mg/dL — AB
Specific Gravity, Urine: 1.011 (ref 1.005–1.030)
Urobilinogen, UA: 0.2 mg/dL (ref 0.0–1.0)
pH: 5.5 (ref 5.0–8.0)

## 2015-07-28 LAB — COMPREHENSIVE METABOLIC PANEL
ALT: 12 U/L — ABNORMAL LOW (ref 14–54)
AST: 21 U/L (ref 15–41)
Albumin: 4.5 g/dL (ref 3.5–5.0)
Alkaline Phosphatase: 102 U/L (ref 38–126)
Anion gap: 10 (ref 5–15)
BUN: 20 mg/dL (ref 6–20)
CO2: 24 mmol/L (ref 22–32)
Calcium: 9.9 mg/dL (ref 8.9–10.3)
Chloride: 107 mmol/L (ref 101–111)
Creatinine, Ser: 1.21 mg/dL — ABNORMAL HIGH (ref 0.44–1.00)
GFR calc Af Amer: 51 mL/min — ABNORMAL LOW (ref >60)
GFR calc non Af Amer: 44 mL/min — ABNORMAL LOW (ref >60)
Glucose, Bld: 107 mg/dL — ABNORMAL HIGH (ref 65–99)
Potassium: 4.5 mmol/L (ref 3.5–5.1)
Sodium: 141 mmol/L (ref 135–145)
Total Bilirubin: 0.8 mg/dL (ref 0.3–1.2)
Total Protein: 7.2 g/dL (ref 6.5–8.1)

## 2015-07-28 LAB — URINE MICROSCOPIC-ADD ON

## 2015-07-28 MED ORDER — MORPHINE SULFATE (PF) 4 MG/ML IV SOLN
6.0000 mg | Freq: Once | INTRAVENOUS | Status: AC
  Administered 2015-07-29: 6 mg via INTRAVENOUS
  Filled 2015-07-28: qty 2

## 2015-07-28 MED ORDER — SODIUM CHLORIDE 0.9 % IV BOLUS (SEPSIS)
1000.0000 mL | Freq: Once | INTRAVENOUS | Status: AC
  Administered 2015-07-29: 1000 mL via INTRAVENOUS

## 2015-07-28 MED ORDER — AMLODIPINE BESYLATE 5 MG PO TABS
5.0000 mg | ORAL_TABLET | Freq: Once | ORAL | Status: AC
  Administered 2015-07-29: 5 mg via ORAL
  Filled 2015-07-28: qty 1

## 2015-07-28 MED ORDER — DEXTROSE 5 % IV SOLN
1.0000 g | Freq: Once | INTRAVENOUS | Status: AC
  Administered 2015-07-29: 1 g via INTRAVENOUS
  Filled 2015-07-28: qty 10

## 2015-07-28 NOTE — Progress Notes (Addendum)
EDCM spoke to patient at bedside.  Patient is extremely drowsy and falling asleep as [facility] asks her questions.  Per chart review patient goes to a pain clinic.  PCP Dr. Thressa Sheller.  [facility] asked patient if she is still going to her pain clinic?  Patient responded "Yes I go to the [facility] (spelled out by patient) clinic."  [facility] asked if she has taken any extra pain medication today.  Patient responded, "No honey, I did not."  EDCM asked patient if they had changed the dose of her pain medications?  Patient responded, "They changed the oxycodone to '15mg'$  from 5 mg, and I wear a Fentanyl Patch every other day."  Patient reports she has an aide at home who spends 4 hours a day with her named Media planner.  Patient reports Evan's phone number is in her phone.  EDCM handed patient her phone but patient too drowsy to look up the number.  Patient reports she can call Ellard Artis when she needs her.  Patient reports her daughter's name is Kennyth Lose Dinkins.  [facility] asked patient if we could call her daughter?  Patient responded, "I'd rather you didn't because she needs to sleep."  [facility] informed patient that her daughter had called in concerns to her falling at home and thought she may need to go to a facility for a short time for rehab.  Patient responded, "I don't want to go to a rehab."  Patient reports she has never had home health services in the past.  [facility] asked patient if she would be interested in home health services now?  Patient did not answer and fell asleep.  EDCM tried two more times to speak to patient, however patient too drowsy to answer.  At beginning of conversation EDRN asked patient if she used anything to ambulate with.  Patient reported she uses a cane but expressed interest in a walker and wheelchair.  Patient will only be able to get one or the other.  Rolator may be appropriate for patient.  Unable to determine if patient is agreeable to home health services as she is too drowsy to speak to [facility].  EDCM will place  [facility] consult.  Will discuss with EDP.  No further EDCM needs at this time.  07/28/2015 A.Leonides Schanz Patient's daughter Darci Needle arrived at bedside. [name] lives in [place]. [name] reports Warden/ranger, patient's aide, lives with the patient.  EDCM asked [name] why the patient is so drowsy?  [name] responded, "Because she doesn't sleep.  She's been that way since her husband died."  [name] also reports patient has not had her pain medications in 2 months and was not permitted to go back to her pain clinic.  [name] reports patient's pcp Dr. Noah Delaine is trying to help patient find a new pain clinic.   Patient came back to room after testing.  Explained home health services to patient again and patient responded, "I don't know about that."  [facility] left list of home health agencies in [facility] and bedside.  Order for Rolator placed.

## 2015-07-28 NOTE — ED Notes (Signed)
Patient arrived from home via EMS.  Per EMS, patient has fallen twice today at home and she continues to fall.   Patient complains of a headache.  Per EMS, patient is awake and alert.  Daughter reported to EMS that mom wasn't being taken good care of at home.  She feels like her mother needs placement due to falls.    BP: 200/110 R:18 CBG: 94 O2: 97% room air  Daughter's name is [name] [phone]

## 2015-07-28 NOTE — ED Notes (Signed)
[place], Education officer, museum, notified of need for consult/evaluation

## 2015-07-28 NOTE — ED Provider Notes (Signed)
CSN: [id number]     Arrival date & time 07/28/15  2112 History  By signing my name below, I, Terrance Branch, attest that this documentation has been prepared under the direction and in the presence of Everlene Balls, MD. Electronically Signed: Randa Evens, ED Scribe. 07/28/2015. 11:22 PM.      Chief Complaint  Patient presents with  . Fall    Patient is a 69 y.o. female presenting with fall. The history is provided by the patient and a relative. No language interpreter was used.  Fall This is a chronic problem. The current episode started 12 to 24 hours ago. The problem occurs every several days. The problem has not changed since onset.Nothing aggravates the symptoms. Nothing relieves the symptoms. She has tried nothing for the symptoms.   HPI Comments: [name] is a 69 y.o. female brought in by ambulance, who presents to the Emergency Department complaining of fall x2 onset this morning. Family member states this morning that she was found on the kitchen floor this morning. Family states that she fell again after falling asleep sitting in a chair in the dining room. Family states that she falls about 1x per week. Pt reports she has chronic pain but denies any pain from the fall.   Past Medical History  Diagnosis Date  . Asthma   . HTN (hypertension)   . Grave's disease   . Fibromyalgia   . Thrombophlebitis   . Depression   . Angina   . Shortness of breath   . COPD (chronic obstructive pulmonary disease)   . Sleep apnea   . Blood dyscrasia     hx of thrombphlebitis  . Recurrent upper respiratory infection (URI)   . GERD (gastroesophageal reflux disease)   . Headache(784.0)   . Arthritis   . CAD (coronary artery disease)   . Chronic kidney disease   . Peripheral vascular disease   . Diabetes mellitus   . Ulcer   . Diarrhea     chronic   . Thyroid disorder   . Anxiety and depression   . Chronic pain     leg and feet  . DDD (degenerative disc disease)   . Rhinitis    . OP (osteoporosis)   . Vitamin B 12 deficiency   . PUD (peptic ulcer disease)   . DDD (degenerative disc disease)   . Spinal stenosis   . Spinal stenosis of lumbar region 04/23/2013  . Tobacco use disorder 04/23/2013  . DVT (deep venous thrombosis)   . Carotid artery occlusion   . Parkinson's disease   . Dry mouth    Past Surgical History  Procedure Laterality Date  . Gastrectomy      age 66   Part of small intestin and part of stomach  . Cholecystectomy  1999  . Carotid endarterectomy Left Sept. 20,2011    cea  . Left heart catheterization with coronary angiogram N/A 10/02/2011    Procedure: LEFT HEART CATHETERIZATION WITH CORONARY ANGIOGRAM;  Surgeon: Sueanne Margarita, MD;  Location: [facility];  Service: Cardiovascular;  Laterality: N/A;   Family History  Problem Relation Age of Onset  . Diabetes Mother   . Hyperlipidemia Mother   . Heart attack Mother   . Other Mother     varicose veins,respiratory,stroke  . Heart disease Mother     before age 79  . Hypertension Mother   . Varicose Veins Mother   . Diabetes Father   . Heart disease Father  before age 35  . Hyperlipidemia Father   . Heart attack Father   . Other Father     varicose veins  . Hypertension Father   . Varicose Veins Father   . Diabetes Sister   . Heart disease Sister     before age 75  . Hyperlipidemia Sister   . Heart attack Sister   . Other Sister     varicose veins  . Hypertension Sister   . Varicose Veins Sister   . Peripheral vascular disease Sister   . Diabetes Sister   . Hyperlipidemia Sister   . Hypertension Sister   . Varicose Veins Sister    Social History  Substance Use Topics  . Smoking status: Current Every Day Smoker -- 2.00 packs/day for 50 years    Types: Cigarettes  . Smokeless tobacco: Never Used     Comment: cessation info given and reviewed  . Alcohol Use: No   OB History    No data available      Review of Systems  All other systems reviewed and are  negative.   Allergies  Clarithromycin and Bee venom  Home Medications   Prior to Admission medications   Medication Sig Start Date End Date Taking? Authorizing Kaira Stringfield  aspirin 81 MG tablet Take 81 mg by mouth daily.     Yes Historical Iran Rowe, MD  CAFFEINE PO Take 1 tablet by mouth daily.   Yes Historical Lynnox Girten, MD  clonazePAM (KLONOPIN) 1 MG tablet Take 1 mg by mouth 2 (two) times daily as needed for anxiety (anxiety). For anxiety   Yes Historical Darbie Biancardi, MD  CRANBERRY PO Take 1 capsule by mouth daily.   Yes Historical Miryam Mcelhinney, MD  cyclobenzaprine (FLEXERIL) 5 MG tablet Take 5 mg by mouth at bedtime.  10/30/14  Yes Historical Aphrodite Harpenau, MD  diphenhydrAMINE (BENADRYL) 25 MG tablet Take 25 mg by mouth every 6 (six) hours as needed.   Yes Historical Sherri Levenhagen, MD  DULoxetine (CYMBALTA) 60 MG capsule Take 60 mg by mouth daily.     Yes Historical Kaizen Ibsen, MD  furosemide (LASIX) 20 MG tablet Take 20 mg by mouth daily.   Yes Historical Amay Mijangos, MD  glycopyrrolate (ROBINUL) 2 MG tablet Take 2 mg by mouth every 8 (eight) hours as needed.   Yes Historical Hobert Poplaski, MD  hyoscyamine (LEVSIN SL) 0.125 MG SL tablet Place 0.125 mg under the tongue every 4 (four) hours as needed.   Yes Historical Armanie Ullmer, MD  ibuprofen (ADVIL,MOTRIN) 200 MG tablet Take 200 mg by mouth every 6 (six) hours as needed.   Yes Historical Tuwanna Krausz, MD  metoCLOPramide (REGLAN) 5 MG tablet Take 5 mg by mouth 4 (four) times daily as needed (stomach pain). Take 1 tablet 30 minutes before meals and at bedtime   Yes Historical Imoni Kohen, MD  ondansetron (ZOFRAN) 8 MG tablet Take 8 mg by mouth every 8 (eight) hours as needed for nausea or vomiting.   Yes Historical Maely Clements, MD  pantoprazole (PROTONIX) 40 MG tablet Take 40 mg by mouth 2 (two) times daily.    Yes Historical Daiki Dicostanzo, MD  potassium chloride SA (K-DUR,KLOR-CON) 20 MEQ tablet Take 20 mEq by mouth daily.   Yes Historical Jeanise Durfey, MD  predniSONE (DELTASONE) 5 MG  tablet Take 5 mg by mouth every other day.    Yes Historical Nakhia Levitan, MD  promethazine (PHENERGAN) 25 MG tablet Take 25 mg by mouth every 6 (six) hours as needed for nausea (nausea).  11/11/14  Yes Historical Jadden Yim, MD  simvastatin (  ZOCOR) 20 MG tablet Take 20 mg by mouth at bedtime.   Yes Historical Phoebe Marter, MD  acetaminophen (TYLENOL) 325 MG tablet Take 650 mg by mouth every 6 (six) hours as needed for moderate pain (pain).    Historical Lucien Budney, MD  albuterol-ipratropium (COMBIVENT) 18-103 MCG/ACT inhaler Inhale 2 puffs into the lungs every 6 (six) hours as needed for shortness of breath (sob). For shortness of breath    Historical Clemmie Marxen, MD  amLODipine (NORVASC) 5 MG tablet Take 5 mg by mouth daily.      Historical Meeyah Ovitt, MD  calcium acetate (PHOSLO) 667 MG capsule Take 667 mg by mouth 3 (three) times daily with meals.     Historical Gardenia Witter, MD  dicyclomine (BENTYL) 20 MG tablet Take 20 mg by mouth 4 (four) times daily.     Historical Rodriguez Aguinaldo, MD  fentaNYL (DURAGESIC - DOSED MCG/HR) 100 MCG/HR Place 100 mcg onto the skin every other day.    Historical Raynald Rouillard, MD  HYDROcodone-acetaminophen (NORCO/VICODIN) 5-325 MG per tablet Take 1-2 tablets by mouth every 6 (six) hours as needed for moderate pain. Patient not taking: Reported on 07/06/2015 04/23/15   Lajean Saver, MD  nitroGLYCERIN (NITROSTAT) 0.4 MG SL tablet Place 0.4 mg under the tongue every 5 (five) minutes as needed. For chest pain     Historical Maxene Byington, MD  oxyCODONE (ROXICODONE) 15 MG immediate release tablet Take 15 mg by mouth every 4 (four) hours as needed for pain (pain).  12/01/14   Historical Korianna Washer, MD  Vitamin D, Ergocalciferol, (DRISDOL) 50000 UNITS CAPS Take 50,000 Units by mouth every 7 (seven) days.     Historical Angelic Schnelle, MD   BP 209/97 mmHg  Pulse 106  Temp(Src) 98.7 F (37.1 C) (Oral)  Resp 21  SpO2 99%   Physical Exam  Constitutional: She is oriented to person, place, and time. She appears  well-developed and well-nourished. No distress.  HENT:  Head: Normocephalic and atraumatic.  Nose: Nose normal.  Mouth/Throat: Oropharynx is clear and moist. No oropharyngeal exudate.  Eyes: Conjunctivae and EOM are normal. Pupils are equal, round, and reactive to light. No scleral icterus.  Neck: Normal range of motion. Neck supple. No JVD present. No tracheal deviation present. No thyromegaly present.  Cardiovascular: Regular rhythm and normal heart sounds.  Tachycardia present.  Exam reveals no gallop and no friction rub.   No murmur heard. Pulmonary/Chest: Effort normal and breath sounds normal. No respiratory distress. She has no wheezes. She exhibits no tenderness.  Abdominal: Soft. Bowel sounds are normal. She exhibits no distension and no mass. There is tenderness. There is no rebound and no guarding.  Suprapubic tenderness to palpation.  Musculoskeletal: Normal range of motion. She exhibits no edema or tenderness.  Lymphadenopathy:    She has no cervical adenopathy.  Neurological: She is alert and oriented to person, place, and time. No cranial nerve deficit. She exhibits normal muscle tone.  Skin: Skin is warm and dry. No rash noted. No erythema. No pallor.  Nursing note and vitals reviewed.   ED Course  Procedures (including critical care time) DIAGNOSTIC STUDIES: Oxygen Saturation is 99% on RA, normal by my interpretation.    COORDINATION OF CARE: 11:24 PM-Discussed treatment plan with pt at bedside and pt agreed to plan.     Labs Review Labs Reviewed  COMPREHENSIVE METABOLIC PANEL - Abnormal; Notable for the following:    Glucose, Bld 107 (*)    Creatinine, Ser 1.21 (*)    ALT 12 (*)    GFR  calc non Af Amer 44 (*)    GFR calc Af Amer 51 (*)    All other components within normal limits  URINALYSIS, ROUTINE W REFLEX MICROSCOPIC (NOT AT [facility]) - Abnormal; Notable for the following:    Hgb urine dipstick TRACE (*)    Protein, ur 30 (*)    Nitrite POSITIVE (*)     Leukocytes, UA MODERATE (*)    All other components within normal limits  URINE MICROSCOPIC-ADD ON - Abnormal; Notable for the following:    Bacteria, UA FEW (*)    All other components within normal limits  CBC    Imaging Review Ct Head Wo Contrast  07/28/2015   CLINICAL DATA:  Fall with headache.  EXAM: CT HEAD WITHOUT CONTRAST  TECHNIQUE: Contiguous axial images were obtained from the base of the skull through the vertex without intravenous contrast.  COMPARISON:  03/01/2015  FINDINGS: Skull and Sinuses:Negative for fracture or destructive process. The mastoids, middle ears, and imaged paranasal sinuses are clear.  Orbits: Bilateral cataract resection.  No traumatic finding.  Brain: No evidence of acute infarction, hemorrhage, obstructive hydrocephalus, or mass lesion/mass effect.  Chronic vessel disease with ischemic gliosis confluent around the lateral ventricles. Chronic ventriculomegaly, likely central predominant atrophy.  IMPRESSION: No intracranial injury or fracture.   Electronically Signed   By: Monte Fantasia M.D.   On: 07/28/2015 22:59   I have personally reviewed and evaluated these images and lab results as part of my medical decision-making.   EKG Interpretation   Date/Time:  Thursday July 28 2015 21:37:36 EDT Ventricular Rate:  103 PR Interval:  145 QRS Duration: 85 QT Interval:  377 QTC Calculation: 493 R Axis:   -15 Text Interpretation:  Sinus tachycardia Anterior infarct, old No  significant change since last tracing Confirmed by Glynn Octave  [phone]) on 07/28/2015 11:04:10 PM      MDM   Final diagnoses:  None     Patient presents to the emergency department for frequent falls.  Per the daughter, she follows as frequently as twice per day, on average once per week. On examination patient does have suprapubic tenderness to palpation, urinalysis is positive for infection. This may be the etiology of her frequent falls. Patient was given IV fluids,  her home blood pressure medication. She is tachycardic as well. Patient will require admission for further care. Ceftriaxone was ordered for urine infection.  I personally performed the services described in this documentation, which was scribed in my presence. The recorded information has been reviewed and is accurate.    Everlene Balls, MD 07/29/15 [phone]

## 2015-07-28 NOTE — Progress Notes (Signed)
CSW met with Nurse CM and Daughter at bedside. Patient has been transported to X-ray.  Daughter informed CSW that the patient lives at home. Daughter states that patient's aide Evonne lives with her. Also, she states that another aide comes in during the week to help assist patient with ADL's.   CSW consulted with Nurse CM who states that patient is not interested in facility at this time. Nurse CM sates that she will arrange home for patient and order for equipment.  Daughter states that she lives in [place].   Daughter/[name] [phone]  Willette Brace [phone] ED CSW 07/28/2015 11:26 PM

## 2015-07-28 NOTE — ED Notes (Signed)
Bed: [facility] Expected date:  Expected time:  Means of arrival:  Comments: EMS 68 yo female frequent falls/from home-needs placement/social worker evaluation

## 2015-07-28 NOTE — ED Notes (Signed)
Bed: [facility] Expected date:  Expected time:  Means of arrival:  Comments: Hold for [place] [facility]

## 2015-07-29 ENCOUNTER — Inpatient Hospital Stay (HOSPITAL_COMMUNITY): Payer: Medicare Other

## 2015-07-29 DIAGNOSIS — M79673 Pain in unspecified foot: Secondary | ICD-10-CM | POA: Diagnosis present

## 2015-07-29 DIAGNOSIS — E785 Hyperlipidemia, unspecified: Secondary | ICD-10-CM | POA: Diagnosis present

## 2015-07-29 DIAGNOSIS — F17210 Nicotine dependence, cigarettes, uncomplicated: Secondary | ICD-10-CM | POA: Diagnosis present

## 2015-07-29 DIAGNOSIS — M79606 Pain in leg, unspecified: Secondary | ICD-10-CM | POA: Diagnosis present

## 2015-07-29 DIAGNOSIS — G20 Parkinson's disease: Secondary | ICD-10-CM | POA: Diagnosis present

## 2015-07-29 DIAGNOSIS — E538 Deficiency of other specified B group vitamins: Secondary | ICD-10-CM | POA: Diagnosis present

## 2015-07-29 DIAGNOSIS — J45909 Unspecified asthma, uncomplicated: Secondary | ICD-10-CM | POA: Diagnosis present

## 2015-07-29 DIAGNOSIS — F329 Major depressive disorder, single episode, unspecified: Secondary | ICD-10-CM | POA: Diagnosis present

## 2015-07-29 DIAGNOSIS — E1149 Type 2 diabetes mellitus with other diabetic neurological complication: Secondary | ICD-10-CM | POA: Diagnosis present

## 2015-07-29 DIAGNOSIS — Z7952 Long term (current) use of systemic steroids: Secondary | ICD-10-CM | POA: Diagnosis not present

## 2015-07-29 DIAGNOSIS — R296 Repeated falls: Secondary | ICD-10-CM | POA: Diagnosis present

## 2015-07-29 DIAGNOSIS — N179 Acute kidney failure, unspecified: Secondary | ICD-10-CM | POA: Diagnosis present

## 2015-07-29 DIAGNOSIS — J449 Chronic obstructive pulmonary disease, unspecified: Secondary | ICD-10-CM

## 2015-07-29 DIAGNOSIS — Z86718 Personal history of other venous thrombosis and embolism: Secondary | ICD-10-CM | POA: Diagnosis not present

## 2015-07-29 DIAGNOSIS — M810 Age-related osteoporosis without current pathological fracture: Secondary | ICD-10-CM | POA: Diagnosis present

## 2015-07-29 DIAGNOSIS — Z79899 Other long term (current) drug therapy: Secondary | ICD-10-CM | POA: Diagnosis not present

## 2015-07-29 DIAGNOSIS — Z7982 Long term (current) use of aspirin: Secondary | ICD-10-CM | POA: Diagnosis not present

## 2015-07-29 DIAGNOSIS — N390 Urinary tract infection, site not specified: Principal | ICD-10-CM

## 2015-07-29 DIAGNOSIS — F419 Anxiety disorder, unspecified: Secondary | ICD-10-CM | POA: Diagnosis present

## 2015-07-29 DIAGNOSIS — A419 Sepsis, unspecified organism: Secondary | ICD-10-CM

## 2015-07-29 DIAGNOSIS — Z91030 Bee allergy status: Secondary | ICD-10-CM | POA: Diagnosis not present

## 2015-07-29 DIAGNOSIS — G4730 Sleep apnea, unspecified: Secondary | ICD-10-CM | POA: Diagnosis present

## 2015-07-29 DIAGNOSIS — E0500 Thyrotoxicosis with diffuse goiter without thyrotoxic crisis or storm: Secondary | ICD-10-CM | POA: Diagnosis present

## 2015-07-29 DIAGNOSIS — Z8711 Personal history of peptic ulcer disease: Secondary | ICD-10-CM | POA: Diagnosis not present

## 2015-07-29 DIAGNOSIS — G8929 Other chronic pain: Secondary | ICD-10-CM | POA: Diagnosis present

## 2015-07-29 DIAGNOSIS — J310 Chronic rhinitis: Secondary | ICD-10-CM | POA: Diagnosis present

## 2015-07-29 DIAGNOSIS — K219 Gastro-esophageal reflux disease without esophagitis: Secondary | ICD-10-CM | POA: Diagnosis present

## 2015-07-29 DIAGNOSIS — Z881 Allergy status to other antibiotic agents status: Secondary | ICD-10-CM | POA: Diagnosis not present

## 2015-07-29 DIAGNOSIS — T501X5A Adverse effect of loop [high-ceiling] diuretics, initial encounter: Secondary | ICD-10-CM | POA: Diagnosis present

## 2015-07-29 DIAGNOSIS — I129 Hypertensive chronic kidney disease with stage 1 through stage 4 chronic kidney disease, or unspecified chronic kidney disease: Secondary | ICD-10-CM | POA: Diagnosis present

## 2015-07-29 DIAGNOSIS — I10 Essential (primary) hypertension: Secondary | ICD-10-CM | POA: Diagnosis not present

## 2015-07-29 DIAGNOSIS — M4806 Spinal stenosis, lumbar region: Secondary | ICD-10-CM | POA: Diagnosis present

## 2015-07-29 DIAGNOSIS — Z8672 Personal history of thrombophlebitis: Secondary | ICD-10-CM | POA: Diagnosis not present

## 2015-07-29 DIAGNOSIS — I2510 Atherosclerotic heart disease of native coronary artery without angina pectoris: Secondary | ICD-10-CM | POA: Diagnosis present

## 2015-07-29 DIAGNOSIS — N189 Chronic kidney disease, unspecified: Secondary | ICD-10-CM | POA: Diagnosis present

## 2015-07-29 DIAGNOSIS — M797 Fibromyalgia: Secondary | ICD-10-CM | POA: Diagnosis present

## 2015-07-29 DIAGNOSIS — I739 Peripheral vascular disease, unspecified: Secondary | ICD-10-CM | POA: Diagnosis present

## 2015-07-29 DIAGNOSIS — R6510 Systemic inflammatory response syndrome (SIRS) of non-infectious origin without acute organ dysfunction: Secondary | ICD-10-CM | POA: Diagnosis present

## 2015-07-29 DIAGNOSIS — Z79891 Long term (current) use of opiate analgesic: Secondary | ICD-10-CM | POA: Diagnosis not present

## 2015-07-29 LAB — CBC WITH DIFFERENTIAL/PLATELET
Basophils Absolute: 0 10*3/uL (ref 0.0–0.1)
Basophils Relative: 0 %
Eosinophils Absolute: 0 10*3/uL (ref 0.0–0.7)
Eosinophils Relative: 0 %
HCT: 40 % (ref 36.0–46.0)
Hemoglobin: 13.1 g/dL (ref 12.0–15.0)
Lymphocytes Relative: 8 %
Lymphs Abs: 0.7 10*3/uL (ref 0.7–4.0)
MCH: 31.6 pg (ref 26.0–34.0)
MCHC: 32.8 g/dL (ref 30.0–36.0)
MCV: 96.4 fL (ref 78.0–100.0)
Monocytes Absolute: 0.3 10*3/uL (ref 0.1–1.0)
Monocytes Relative: 4 %
Neutro Abs: 7.9 10*3/uL — ABNORMAL HIGH (ref 1.7–7.7)
Neutrophils Relative %: 88 %
Platelets: 233 10*3/uL (ref 150–400)
RBC: 4.15 MIL/uL (ref 3.87–5.11)
RDW: 14.3 % (ref 11.5–15.5)
WBC: 8.9 10*3/uL (ref 4.0–10.5)

## 2015-07-29 LAB — COMPREHENSIVE METABOLIC PANEL
ALT: 10 U/L — ABNORMAL LOW (ref 14–54)
AST: 21 U/L (ref 15–41)
Albumin: 3.7 g/dL (ref 3.5–5.0)
Alkaline Phosphatase: 93 U/L (ref 38–126)
Anion gap: 11 (ref 5–15)
BUN: 17 mg/dL (ref 6–20)
CO2: 23 mmol/L (ref 22–32)
Calcium: 9.1 mg/dL (ref 8.9–10.3)
Chloride: 107 mmol/L (ref 101–111)
Creatinine, Ser: 1.01 mg/dL — ABNORMAL HIGH (ref 0.44–1.00)
GFR calc Af Amer: >60 mL/min (ref >60)
GFR calc non Af Amer: 55 mL/min — ABNORMAL LOW (ref >60)
Glucose, Bld: 123 mg/dL — ABNORMAL HIGH (ref 65–99)
Potassium: 3.9 mmol/L (ref 3.5–5.1)
Sodium: 141 mmol/L (ref 135–145)
Total Bilirubin: 0.5 mg/dL (ref 0.3–1.2)
Total Protein: 6.4 g/dL — ABNORMAL LOW (ref 6.5–8.1)

## 2015-07-29 LAB — RAPID URINE DRUG SCREEN, HOSP PERFORMED
Amphetamines: NOT DETECTED
Barbiturates: NOT DETECTED
Benzodiazepines: POSITIVE — AB
Cocaine: NOT DETECTED
Opiates: POSITIVE — AB
Tetrahydrocannabinol: NOT DETECTED

## 2015-07-29 LAB — GLUCOSE, CAPILLARY
Glucose-Capillary: 102 mg/dL — ABNORMAL HIGH (ref 65–99)
Glucose-Capillary: 103 mg/dL — ABNORMAL HIGH (ref 65–99)
Glucose-Capillary: 118 mg/dL — ABNORMAL HIGH (ref 65–99)
Glucose-Capillary: 121 mg/dL — ABNORMAL HIGH (ref 65–99)
Glucose-Capillary: 126 mg/dL — ABNORMAL HIGH (ref 65–99)

## 2015-07-29 LAB — ETHANOL: Alcohol, Ethyl (B): <5 mg/dL (ref <5)

## 2015-07-29 LAB — LACTIC ACID, PLASMA
Lactic Acid, Venous: 0.9 mmol/L (ref 0.5–2.0)
Lactic Acid, Venous: 0.8 mmol/L (ref 0.5–2.0)

## 2015-07-29 LAB — PROTIME-INR
INR: 0.95 (ref 0.00–1.49)
Prothrombin Time: 12.9 seconds (ref 11.6–15.2)

## 2015-07-29 LAB — TROPONIN I
Troponin I: <0.03 ng/mL (ref <0.031)
Troponin I: <0.03 ng/mL (ref <0.031)
Troponin I: 0.03 ng/mL (ref <0.031)

## 2015-07-29 LAB — PROCALCITONIN: Procalcitonin: 0.1 ng/mL

## 2015-07-29 LAB — TSH: TSH: 0.302 u[IU]/mL — ABNORMAL LOW (ref 0.350–4.500)

## 2015-07-29 LAB — APTT: aPTT: 28 seconds (ref 24–37)

## 2015-07-29 MED ORDER — ASPIRIN 81 MG PO CHEW
81.0000 mg | CHEWABLE_TABLET | Freq: Every day | ORAL | Status: DC
  Administered 2015-07-29 – 2015-07-30 (×2): 81 mg via ORAL
  Filled 2015-07-29 (×2): qty 1

## 2015-07-29 MED ORDER — OXYMETAZOLINE HCL 0.05 % NA SOLN
1.0000 | Freq: Two times a day (BID) | NASAL | Status: DC | PRN
  Filled 2015-07-29: qty 15

## 2015-07-29 MED ORDER — ADULT MULTIVITAMIN W/MINERALS CH
1.0000 | ORAL_TABLET | Freq: Every day | ORAL | Status: DC
  Administered 2015-07-29 – 2015-07-30 (×2): 1 via ORAL
  Filled 2015-07-29 (×2): qty 1

## 2015-07-29 MED ORDER — SIMVASTATIN 20 MG PO TABS
20.0000 mg | ORAL_TABLET | Freq: Every day | ORAL | Status: DC
  Administered 2015-07-29: 20 mg via ORAL
  Filled 2015-07-29: qty 1

## 2015-07-29 MED ORDER — ACETAMINOPHEN 325 MG PO TABS
650.0000 mg | ORAL_TABLET | Freq: Four times a day (QID) | ORAL | Status: DC | PRN
  Administered 2015-07-29 – 2015-07-30 (×3): 650 mg via ORAL
  Filled 2015-07-29 (×3): qty 2

## 2015-07-29 MED ORDER — ONDANSETRON HCL 4 MG PO TABS: 4.0000 mg | ORAL_TABLET | Freq: Four times a day (QID) | ORAL | Status: DC | PRN

## 2015-07-29 MED ORDER — IPRATROPIUM-ALBUTEROL 0.5-2.5 (3) MG/3ML IN SOLN: 3.0000 mL | RESPIRATORY_TRACT | Status: DC

## 2015-07-29 MED ORDER — HYOSCYAMINE SULFATE 0.125 MG SL SUBL
0.1250 mg | SUBLINGUAL_TABLET | SUBLINGUAL | Status: DC | PRN
  Filled 2015-07-29: qty 1

## 2015-07-29 MED ORDER — VITAMIN B-1 100 MG PO TABS
100.0000 mg | ORAL_TABLET | Freq: Every day | ORAL | Status: DC
  Administered 2015-07-29 – 2015-07-30 (×2): 100 mg via ORAL
  Filled 2015-07-29 (×2): qty 1

## 2015-07-29 MED ORDER — GLYCOPYRROLATE 1 MG PO TABS
2.0000 mg | ORAL_TABLET | Freq: Three times a day (TID) | ORAL | Status: DC | PRN
  Filled 2015-07-29: qty 2

## 2015-07-29 MED ORDER — NITROGLYCERIN 0.4 MG SL SUBL: 0.4000 mg | SUBLINGUAL_TABLET | SUBLINGUAL | Status: DC | PRN

## 2015-07-29 MED ORDER — PREDNISONE 5 MG PO TABS
5.0000 mg | ORAL_TABLET | ORAL | Status: DC
  Administered 2015-07-29: 5 mg via ORAL
  Filled 2015-07-29: qty 1

## 2015-07-29 MED ORDER — OXYCODONE-ACETAMINOPHEN 5-325 MG PO TABS
1.0000 | ORAL_TABLET | Freq: Four times a day (QID) | ORAL | Status: DC | PRN
  Administered 2015-07-29 – 2015-07-30 (×3): 1 via ORAL
  Filled 2015-07-29 (×3): qty 1

## 2015-07-29 MED ORDER — NICOTINE 21 MG/24HR TD PT24: 21.0000 mg | MEDICATED_PATCH | Freq: Every day | TRANSDERMAL | Status: DC

## 2015-07-29 MED ORDER — ACETAMINOPHEN 650 MG RE SUPP: 650.0000 mg | Freq: Four times a day (QID) | RECTAL | Status: DC | PRN

## 2015-07-29 MED ORDER — ONDANSETRON HCL 4 MG/2ML IJ SOLN: 4.0000 mg | Freq: Four times a day (QID) | INTRAMUSCULAR | Status: DC | PRN

## 2015-07-29 MED ORDER — FENTANYL 100 MCG/HR TD PT72
100.0000 ug | MEDICATED_PATCH | TRANSDERMAL | Status: DC
  Administered 2015-07-29: 100 ug via TRANSDERMAL
  Filled 2015-07-29: qty 1

## 2015-07-29 MED ORDER — SODIUM CHLORIDE 0.9 % IV SOLN
INTRAVENOUS | Status: DC
  Administered 2015-07-29 (×2): via INTRAVENOUS

## 2015-07-29 MED ORDER — DEXTROSE 5 % IV SOLN: 2.0000 g | Freq: Once | INTRAVENOUS | Status: DC

## 2015-07-29 MED ORDER — FOLIC ACID 1 MG PO TABS
1.0000 mg | ORAL_TABLET | Freq: Every day | ORAL | Status: DC
  Administered 2015-07-29 – 2015-07-30 (×2): 1 mg via ORAL
  Filled 2015-07-29 (×2): qty 1

## 2015-07-29 MED ORDER — CLONAZEPAM 1 MG PO TABS
1.0000 mg | ORAL_TABLET | Freq: Two times a day (BID) | ORAL | Status: DC | PRN
  Administered 2015-07-29 – 2015-07-30 (×2): 1 mg via ORAL
  Filled 2015-07-29 (×2): qty 1

## 2015-07-29 MED ORDER — DEXTROSE 5 % IV SOLN
1.0000 g | Freq: Once | INTRAVENOUS | Status: AC
  Administered 2015-07-29: 1 g via INTRAVENOUS
  Filled 2015-07-29: qty 10

## 2015-07-29 MED ORDER — SODIUM CHLORIDE 0.9 % IJ SOLN
3.0000 mL | Freq: Two times a day (BID) | INTRAMUSCULAR | Status: DC
  Administered 2015-07-29: 3 mL via INTRAVENOUS

## 2015-07-29 MED ORDER — DICYCLOMINE HCL 20 MG PO TABS
20.0000 mg | ORAL_TABLET | Freq: Four times a day (QID) | ORAL | Status: DC
  Administered 2015-07-29 – 2015-07-30 (×6): 20 mg via ORAL
  Filled 2015-07-29 (×8): qty 1

## 2015-07-29 MED ORDER — HYDRALAZINE HCL 20 MG/ML IJ SOLN
10.0000 mg | Freq: Four times a day (QID) | INTRAMUSCULAR | Status: DC | PRN
  Administered 2015-07-29: 10 mg via INTRAVENOUS
  Filled 2015-07-29: qty 1

## 2015-07-29 MED ORDER — HYDRALAZINE HCL 20 MG/ML IJ SOLN
5.0000 mg | Freq: Once | INTRAMUSCULAR | Status: AC
Start: 2015-07-29 — End: 2015-07-29
  Administered 2015-07-29: 5 mg via INTRAVENOUS
  Filled 2015-07-29: qty 1

## 2015-07-29 MED ORDER — IPRATROPIUM-ALBUTEROL 18-103 MCG/ACT IN AERO: 2.0000 | INHALATION_SPRAY | Freq: Four times a day (QID) | RESPIRATORY_TRACT | Status: DC | PRN

## 2015-07-29 MED ORDER — FENTANYL 100 MCG/HR TD PT72: 100.0000 ug | MEDICATED_PATCH | TRANSDERMAL | Status: DC

## 2015-07-29 MED ORDER — HEPARIN SODIUM (PORCINE) 5000 UNIT/ML IJ SOLN
5000.0000 [IU] | Freq: Three times a day (TID) | INTRAMUSCULAR | Status: DC
  Administered 2015-07-29 – 2015-07-30 (×4): 5000 [IU] via SUBCUTANEOUS
  Filled 2015-07-29 (×4): qty 1

## 2015-07-29 MED ORDER — AMLODIPINE BESYLATE 5 MG PO TABS
5.0000 mg | ORAL_TABLET | Freq: Every day | ORAL | Status: DC
  Administered 2015-07-29 – 2015-07-30 (×2): 5 mg via ORAL
  Filled 2015-07-29 (×2): qty 1

## 2015-07-29 MED ORDER — CALCIUM ACETATE (PHOS BINDER) 667 MG PO CAPS
667.0000 mg | ORAL_CAPSULE | Freq: Three times a day (TID) | ORAL | Status: DC
  Administered 2015-07-29 – 2015-07-30 (×4): 667 mg via ORAL
  Filled 2015-07-29 (×8): qty 1

## 2015-07-29 MED ORDER — INSULIN ASPART 100 UNIT/ML ~~LOC~~ SOLN
0.0000 [IU] | Freq: Three times a day (TID) | SUBCUTANEOUS | Status: DC
  Administered 2015-07-29: 2 [IU] via SUBCUTANEOUS
  Administered 2015-07-30: 3 [IU] via SUBCUTANEOUS

## 2015-07-29 MED ORDER — DEXTROSE 5 % IV SOLN
1.0000 g | INTRAVENOUS | Status: DC
  Administered 2015-07-30: 1 g via INTRAVENOUS
  Filled 2015-07-29: qty 10

## 2015-07-29 MED ORDER — ENSURE ENLIVE PO LIQD
237.0000 mL | Freq: Two times a day (BID) | ORAL | Status: DC
  Administered 2015-07-30: 237 mL via ORAL

## 2015-07-29 MED ORDER — DULOXETINE HCL 60 MG PO CPEP
60.0000 mg | ORAL_CAPSULE | Freq: Every day | ORAL | Status: DC
  Administered 2015-07-29 – 2015-07-30 (×2): 60 mg via ORAL
  Filled 2015-07-29 (×2): qty 1

## 2015-07-29 MED ORDER — METOCLOPRAMIDE HCL 10 MG PO TABS: 5.0000 mg | ORAL_TABLET | Freq: Four times a day (QID) | ORAL | Status: DC | PRN

## 2015-07-29 MED ORDER — HYDRALAZINE HCL 10 MG PO TABS
10.0000 mg | ORAL_TABLET | Freq: Four times a day (QID) | ORAL | Status: DC
  Administered 2015-07-29 – 2015-07-30 (×4): 10 mg via ORAL
  Filled 2015-07-29 (×4): qty 1

## 2015-07-29 MED ORDER — NICOTINE 21 MG/24HR TD PT24
21.0000 mg | MEDICATED_PATCH | Freq: Every day | TRANSDERMAL | Status: DC
  Administered 2015-07-29 – 2015-07-30 (×2): 21 mg via TRANSDERMAL
  Filled 2015-07-29 (×2): qty 1

## 2015-07-29 MED ORDER — PANTOPRAZOLE SODIUM 40 MG PO TBEC
40.0000 mg | DELAYED_RELEASE_TABLET | Freq: Two times a day (BID) | ORAL | Status: DC
  Administered 2015-07-29 – 2015-07-30 (×4): 40 mg via ORAL
  Filled 2015-07-29 (×4): qty 1

## 2015-07-29 MED ORDER — IPRATROPIUM-ALBUTEROL 0.5-2.5 (3) MG/3ML IN SOLN: 3.0000 mL | Freq: Four times a day (QID) | RESPIRATORY_TRACT | Status: DC | PRN

## 2015-07-29 MED ORDER — VITAMIN D (ERGOCALCIFEROL) 1.25 MG (50000 UNIT) PO CAPS
50000.0000 [IU] | ORAL_CAPSULE | ORAL | Status: DC
  Administered 2015-07-29: 50000 [IU] via ORAL
  Filled 2015-07-29: qty 1

## 2015-07-29 NOTE — Evaluation (Signed)
Physical Therapy Evaluation Patient Details Name: [name] MRN: [id number] DOB: [birth year] Today's Date: 07/29/2015   History of Present Illness  [name] is a 69 y.o. female with history of COPD HTN HLD presents with falling episodes. Patients daughter states that she has been falling at home. the patient fell asleep in teh bathroom when her caregiver found her this morning. Patient again fell , brought to ED 07/28/15.[name] Apparently the patient falls asleep very easily. Patient has h/o Grave's dz, Parkinson's, COPD. CT head  Negative. To have MRI of brain.  Clinical Impression  Patient is impulsive, requires cues for safety. Patient will benefit from PT to address problems listed in note below to improve to DC to next venue if patient will  Consent.    Follow Up Recommendations SNF;Supervision/Assistance - 24 hour    Equipment Recommendations  Rolling walker with 5" wheels    Recommendations for Other Services       Precautions / Restrictions Precautions Precautions: Fall      Mobility  Bed Mobility Overal bed mobility: Needs Assistance Bed Mobility: Supine to Sit;Sit to Supine     Supine to sit: Supervision Sit to supine: Supervision   General bed mobility comments: no assist  Transfers     Transfers: Sit to/from Stand;Stand Pivot Transfers Sit to Stand: Min assist Stand pivot transfers: Min assist       General transfer comment: patient began to stand before PT ready, cues for safety, stand pivot to [facility] and  to bed.  Ambulation/Gait                Stairs            Wheelchair Mobility    Modified Rankin (Stroke Patients Only)       Balance Overall balance assessment: Needs assistance;History of Falls Sitting-balance support: No upper extremity supported;Feet supported Sitting balance-Leahy Scale: Fair     Standing balance support: During functional activity;Single extremity supported Standing balance-Leahy Scale: Fair                               Pertinent Vitals/Pain Pain Assessment: Faces Faces Pain Scale: Hurts even more Pain Location: both hips Pain Descriptors / Indicators: Aching;Sore;Cramping Pain Intervention(s): Limited activity within patient's tolerance;Premedicated before session;Repositioned    Home Living Family/patient expects to be discharged to:: Private residence Living Arrangements: Alone Available Help at Discharge: Personal care attendant (4 hours/ day) Type of Home: House Home Access: Stairs to enter   [employer] of Steps: 1 Home Layout: One level Home Equipment: [place] - single point Additional Comments: there may be some one else in the home who works but family and patient did not elaborate.     Prior Function Level of Independence: Independent               Hand Dominance        Extremity/Trunk Assessment   Upper Extremity Assessment: Generalized weakness           Lower Extremity Assessment: Generalized weakness      Cervical / Trunk Assessment: Kyphotic  Communication   Communication:  (noted expressive difficulties , word finding when talking on the phone)  Cognition Arousal/Alertness: Awake/alert Behavior During Therapy: Anxious;Restless;Impulsive Overall Cognitive Status: Impaired/Different from baseline Area of Impairment: Orientation;Safety/judgement Orientation Level: Place;Time   Memory: Decreased recall of precautions   Safety/Judgement: Decreased awareness of safety     General Comments: states [place],  october.    General Comments      Exercises        Assessment/Plan    PT Assessment    PT Diagnosis Difficulty walking;Generalized weakness;Acute pain;Altered mental status   PT Problem List Decreased strength;Decreased activity tolerance;Decreased balance;Decreased mobility;Decreased safety awareness;Decreased knowledge of use of DME;Decreased cognition;Pain;Decreased knowledge of precautions  PT  Treatment Interventions DME instruction;Gait training;Functional mobility training;Therapeutic activities;Therapeutic exercise;Patient/family education   PT Goals (Current goals can be found in the Care Plan section) Acute Rehab PT Goals Patient Stated Goal: to go home to my dogs PT Goal Formulation: With patient/family Time For Goal Achievement: 08/12/15 Potential to Achieve Goals: Good    Frequency Min 3X/week   Barriers to discharge Decreased caregiver support      Co-evaluation               End of Session   Activity Tolerance: Patient limited by fatigue Patient left: in bed;with call bell/phone within reach;with bed alarm set;with family/visitor present Nurse Communication: Mobility status         Time: 8250-5397 PT Time Calculation (min) (ACUTE ONLY): 34 min   Charges:   PT Evaluation $Initial PT Evaluation Tier I: 1 Procedure PT Treatments $Therapeutic Activity: 8-22 mins   PT G Codes:        Claretha Cooper 07/29/2015, 4:11 PM

## 2015-07-29 NOTE — ED Notes (Signed)
On initial assessment the pt reported that she had not been taking all of her medications like she should have "for weeks". When talking to CM, pt reported she was taking her pain meds after a recent change in her pain medication and had a fentanyl patch. When the daughter arrived to the bedside, pt daughter reports pt was d/c from pain clinic about 2 months ago, was still getting a Fentanyl patch (last talked to pt on Sunday who told her she had put a new on on), and may have been taking another individuals pain meds. When I addressed the pt about taking medications, she says that her pain doctor gave her "an extra prescription" when she left the pain clinic and this is the meds she has been taking.

## 2015-07-29 NOTE — Progress Notes (Signed)
Pt admitted after midnight. Admitted for eval of fall. Found to have UTI. CT head no acute findings. Treated with rocephin for UTI. More difficulty speaking per family report. Order placed for MRI brian for further eval.  Leisa Lenz [facility] [phone]

## 2015-07-29 NOTE — Consult Note (Signed)
   [facility] CM Inpatient Consult   07/29/2015  [name] [birth year] [id number]   EPIC referral received for [facility] Care Management services. However, patient is not eligible due to not a beneficiary currently attributed to one of the [facility].  Membership roster used to verify non- eligible status. Will make inpatient RNCM aware.  Marthenia Rolling, MSN-Ed, RN,BSN [facility] Liaison [phone]

## 2015-07-29 NOTE — H&P (Signed)
[facility] History and Physical  LAMA NARAYANAN WOE:[id number] DOB: 02/07/1946 DOA: 07/28/2015  Referring physician: Everlene Balls, MD PCP: Thressa Sheller, MD   Chief Complaint: Falling  HPI: [name] is a 69 y.o. female with history of COPD HTN HLD presents with falling episodes. Patients daughter states that she has been falling at home. Last night the patient fell asleep in teh bathroom when her caregiver found her this morning. Patient again fell this morning. Apparently the patient falls asleep very easily. Patient has been on narcotics as well up until 2 months ago. Patient has history of fibromyalgia and also has generalized weakness. This morning she was found curled up in the kitchen and later on again she was found the floor. It is not clear if she has fallen. Patient refused to come into the the ED so her daughter came down and forced her to come. She has had no fevers or chills. She has had constant pain as noted. Her daughter states she does not know what is going on most of the time due to the pain. In the ED she was found to have a UTI and is being admitted for further evaluation. Daughter does states she has not been eating well and has lost weight over the last 6 months   Review of Systems:  Complete 12 point ROS performed and is unremarkable other than HPI  Past Medical History  Diagnosis Date  . Asthma   . HTN (hypertension)   . Grave's disease   . Fibromyalgia   . Thrombophlebitis   . Depression   . Angina   . Shortness of breath   . COPD (chronic obstructive pulmonary disease)   . Sleep apnea   . Blood dyscrasia     hx of thrombphlebitis  . Recurrent upper respiratory infection (URI)   . GERD (gastroesophageal reflux disease)   . Headache(784.0)   . Arthritis   . CAD (coronary artery disease)   . Chronic kidney disease   . Peripheral vascular disease   . Diabetes mellitus   . Ulcer   . Diarrhea     chronic   . Thyroid disorder   . Anxiety  and depression   . Chronic pain     leg and feet  . DDD (degenerative disc disease)   . Rhinitis   . OP (osteoporosis)   . Vitamin B 12 deficiency   . PUD (peptic ulcer disease)   . DDD (degenerative disc disease)   . Spinal stenosis   . Spinal stenosis of lumbar region 04/23/2013  . Tobacco use disorder 04/23/2013  . DVT (deep venous thrombosis)   . Carotid artery occlusion   . Parkinson's disease   . Dry mouth    Past Surgical History  Procedure Laterality Date  . Gastrectomy      age 53   Part of small intestin and part of stomach  . Cholecystectomy  1999  . Carotid endarterectomy Left Sept. 20,2011    cea  . Left heart catheterization with coronary angiogram N/A 10/02/2011    Procedure: LEFT HEART CATHETERIZATION WITH CORONARY ANGIOGRAM;  Surgeon: Sueanne Margarita, MD;  Location: [facility];  Service: Cardiovascular;  Laterality: N/A;   Social History:  reports that she has been smoking Cigarettes.  She has a 100 pack-year smoking history. She has never used smokeless tobacco. She reports that she does not drink alcohol or use illicit drugs.  Allergies  Allergen Reactions  . Clarithromycin Nausea And Vomiting and Nausea  Only  . Bee Venom     Family History  Problem Relation Age of Onset  . Diabetes Mother   . Hyperlipidemia Mother   . Heart attack Mother   . Other Mother     varicose veins,respiratory,stroke  . Heart disease Mother     before age 88  . Hypertension Mother   . Varicose Veins Mother   . Diabetes Father   . Heart disease Father     before age 80  . Hyperlipidemia Father   . Heart attack Father   . Other Father     varicose veins  . Hypertension Father   . Varicose Veins Father   . Diabetes Sister   . Heart disease Sister     before age 9  . Hyperlipidemia Sister   . Heart attack Sister   . Other Sister     varicose veins  . Hypertension Sister   . Varicose Veins Sister   . Peripheral vascular disease Sister   . Diabetes Sister   .  Hyperlipidemia Sister   . Hypertension Sister   . Varicose Veins Sister      Prior to Admission medications   Medication Sig Start Date End Date Taking? Authorizing Provider  aspirin 81 MG tablet Take 81 mg by mouth daily.     Yes Historical Provider, MD  CAFFEINE PO Take 1 tablet by mouth daily.   Yes Historical Provider, MD  clonazePAM (KLONOPIN) 1 MG tablet Take 1 mg by mouth 2 (two) times daily as needed for anxiety (anxiety). For anxiety   Yes Historical Provider, MD  CRANBERRY PO Take 1 capsule by mouth daily.   Yes Historical Provider, MD  cyclobenzaprine (FLEXERIL) 5 MG tablet Take 5 mg by mouth at bedtime.  10/30/14  Yes Historical Provider, MD  diphenhydrAMINE (BENADRYL) 25 MG tablet Take 25 mg by mouth every 6 (six) hours as needed.   Yes Historical Provider, MD  DULoxetine (CYMBALTA) 60 MG capsule Take 60 mg by mouth daily.     Yes Historical Provider, MD  furosemide (LASIX) 20 MG tablet Take 20 mg by mouth daily.   Yes Historical Provider, MD  glycopyrrolate (ROBINUL) 2 MG tablet Take 2 mg by mouth every 8 (eight) hours as needed.   Yes Historical Provider, MD  hyoscyamine (LEVSIN SL) 0.125 MG SL tablet Place 0.125 mg under the tongue every 4 (four) hours as needed.   Yes Historical Provider, MD  ibuprofen (ADVIL,MOTRIN) 200 MG tablet Take 200 mg by mouth every 6 (six) hours as needed.   Yes Historical Provider, MD  metoCLOPramide (REGLAN) 5 MG tablet Take 5 mg by mouth 4 (four) times daily as needed (stomach pain). Take 1 tablet 30 minutes before meals and at bedtime   Yes Historical Provider, MD  ondansetron (ZOFRAN) 8 MG tablet Take 8 mg by mouth every 8 (eight) hours as needed for nausea or vomiting.   Yes Historical Provider, MD  pantoprazole (PROTONIX) 40 MG tablet Take 40 mg by mouth 2 (two) times daily.    Yes Historical Provider, MD  potassium chloride SA (K-DUR,KLOR-CON) 20 MEQ tablet Take 20 mEq by mouth daily.   Yes Historical Provider, MD  predniSONE (DELTASONE) 5 MG  tablet Take 5 mg by mouth every other day.    Yes Historical Provider, MD  promethazine (PHENERGAN) 25 MG tablet Take 25 mg by mouth every 6 (six) hours as needed for nausea (nausea).  11/11/14  Yes Historical Provider, MD  simvastatin (ZOCOR) 20 MG  tablet Take 20 mg by mouth at bedtime.   Yes Historical Provider, MD  acetaminophen (TYLENOL) 325 MG tablet Take 650 mg by mouth every 6 (six) hours as needed for moderate pain (pain).    Historical Provider, MD  albuterol-ipratropium (COMBIVENT) 18-103 MCG/ACT inhaler Inhale 2 puffs into the lungs every 6 (six) hours as needed for shortness of breath (sob). For shortness of breath    Historical Provider, MD  amLODipine (NORVASC) 5 MG tablet Take 5 mg by mouth daily.      Historical Provider, MD  calcium acetate (PHOSLO) 667 MG capsule Take 667 mg by mouth 3 (three) times daily with meals.     Historical Provider, MD  dicyclomine (BENTYL) 20 MG tablet Take 20 mg by mouth 4 (four) times daily.     Historical Provider, MD  fentaNYL (DURAGESIC - DOSED MCG/HR) 100 MCG/HR Place 100 mcg onto the skin every other day.    Historical Provider, MD  HYDROcodone-acetaminophen (NORCO/VICODIN) 5-325 MG per tablet Take 1-2 tablets by mouth every 6 (six) hours as needed for moderate pain. Patient not taking: Reported on 07/06/2015 04/23/15   Lajean Saver, MD  nitroGLYCERIN (NITROSTAT) 0.4 MG SL tablet Place 0.4 mg under the tongue every 5 (five) minutes as needed. For chest pain     Historical Provider, MD  oxyCODONE (ROXICODONE) 15 MG immediate release tablet Take 15 mg by mouth every 4 (four) hours as needed for pain (pain).  12/01/14   Historical Provider, MD  Vitamin D, Ergocalciferol, (DRISDOL) 50000 UNITS CAPS Take 50,000 Units by mouth every 7 (seven) days.     Historical Provider, MD   Physical Exam: Filed Vitals:   07/28/15 2300 07/28/15 2324 07/28/15 2332 07/29/15 0012  BP: 209/97 216/107  189/78  Pulse: 106 93  84  Temp:   98.9 F (37.2 C)   TempSrc:   Rectal    Resp: 21 20  23   SpO2: 99% 98%  98%    Wt Readings from Last 3 Encounters:  07/06/15 44.906 kg (99 lb)  02/28/15 41.731 kg (92 lb)  01/20/15 44.453 kg (98 lb)    General:  Appears comfortable Eyes: PERRL, normal lids, irises & conjunctiva ENT: grossly normal hearing, lips & tongue Neck: no LAD, masses or thyromegaly Cardiovascular: RRR, no m/r/g. No LE edema. Respiratory: CTA bilaterally, no w/r/r. Normal respiratory effort. Abdomen: soft, +suprpubic tenderness Skin: no rahses Musculoskeletal: grossly normal tone BUE/BLE Psychiatric: somnolent but arousable when questioned Neurologic: grossly moves all four extermities          Labs on Admission:  Basic Metabolic Panel:  Recent Labs Lab 07/28/15 2230  NA 141  K 4.5  CL 107  CO2 24  GLUCOSE 107*  BUN 20  CREATININE 1.21*  CALCIUM 9.9   Liver Function Tests:  Recent Labs Lab 07/28/15 2230  AST 21  ALT 12*  ALKPHOS 102  BILITOT 0.8  PROT 7.2  ALBUMIN 4.5   No results for input(s): LIPASE, AMYLASE in the last 168 hours. No results for input(s): AMMONIA in the last 168 hours. CBC:  Recent Labs Lab 07/28/15 2230  WBC 8.9  HGB 13.2  HCT 40.2  MCV 96.6  PLT 224   Cardiac Enzymes: No results for input(s): CKTOTAL, CKMB, CKMBINDEX, TROPONINI in the last 168 hours.  BNP (last 3 results) No results for input(s): BNP in the last 8760 hours.  ProBNP (last 3 results) No results for input(s): PROBNP in the last 8760 hours.  CBG: No results for  input(s): GLUCAP in the last 168 hours.  Radiological Exams on Admission: Dg Chest 2 View  07/28/2015   CLINICAL DATA:  Cough for 1 week.  Frequent falls.  EXAM: CHEST  2 VIEW  COMPARISON:  05/24/2015  FINDINGS: Lungs are hyperinflated with emphysema. Cardiomediastinal contours are unchanged, heart size is normal. Mild tortuosity of the thoracic aorta. Linear atelectasis or scarring at the left lung base. No pulmonary edema, consolidation, pleural effusion or  pneumothorax. Skin folds project over both hemithoraces. The bones are under mineralized.  IMPRESSION: Emphysema without acute pulmonary process.   Electronically Signed   By: Jeb Levering M.D.   On: 07/28/2015 23:45   Ct Head Wo Contrast  07/28/2015   CLINICAL DATA:  Fall with headache.  EXAM: CT HEAD WITHOUT CONTRAST  TECHNIQUE: Contiguous axial images were obtained from the base of the skull through the vertex without intravenous contrast.  COMPARISON:  03/01/2015  FINDINGS: Skull and Sinuses:Negative for fracture or destructive process. The mastoids, middle ears, and imaged paranasal sinuses are clear.  Orbits: Bilateral cataract resection.  No traumatic finding.  Brain: No evidence of acute infarction, hemorrhage, obstructive hydrocephalus, or mass lesion/mass effect.  Chronic vessel disease with ischemic gliosis confluent around the lateral ventricles. Chronic ventriculomegaly, likely central predominant atrophy.  IMPRESSION: No intracranial injury or fracture.   Electronically Signed   By: Monte Fantasia M.D.   On: 07/28/2015 22:59      Assessment/Plan Active Problems:   UTI (lower urinary tract infection)   COPD (chronic obstructive pulmonary disease)   Diabetes mellitus with neurological manifestation   Frequent falls   HTN (hypertension)   AKI (acute kidney injury)   1. UTI -will follow up on cultures -will start on rocephin now -IVF as needed  2. COPD -will continue with inhalers  3. Falls -patient is on several medications (polypharmacy)which may be contributing to falls and should be reviewed during this admission -may need PT/OT evaluation -fall precautions  4. HTN -will monitor pressures -continue with norvasc  5. AKI -started on IVF will monitor Met b  6. DM Type II -will monitor FSBS and provide SSI as needed -check A1C  7. Hyperlipidemia -on statins     Code Status: full code (must indicate code status--if unknown or must be presumed, indicate  so) DVT Prophylaxis:SCD Family Communication: none (indicate person spoken with, if applicable, with phone number if by telephone) Disposition Plan: home (indicate anticipated LOS)    [facility] Pager [phone]

## 2015-07-29 NOTE — Progress Notes (Signed)
Initial Nutrition Assessment  DOCUMENTATION CODES:   Underweight  INTERVENTION:  - Continue Ensure Enlive BID, each supplement provides 350 kcal and 20 grams of protein - RD will continue to monitor for needs  NUTRITION DIAGNOSIS:   Underweight related to chronic illness as evidenced by other (see comment) (BMI 16.3 kg/m2).  GOAL:   Patient will meet greater than or equal to 90% of their needs  MONITOR:   PO intake, Supplement acceptance, Weight trends, Labs, I & O's  REASON FOR ASSESSMENT:   Malnutrition Screening Tool  ASSESSMENT:   69 y.o. female with history of COPD HTN HLD presents with falling episodes. Patients daughter states that she has been falling at home. Last night the patient fell asleep in teh bathroom when her caregiver found her this morning. Patient again fell this morning. Apparently the patient falls asleep very easily. Patient has been on narcotics as well up until 2 months ago. Patient has history of fibromyalgia and also has generalized weakness. This morning she was found curled up in the kitchen and later on again she was found the floor. It is not clear if she has fallen. Patient refused to come into the the ED so her daughter came down and forced her to come. She has had no fevers or chills. She has had constant pain as noted. Her daughter states she does not know what is going on most of the time due to the pain. In the ED she was found to have a UTI and is being admitted for further evaluation. Daughter does states she has not been eating well and has lost weight over the last 6 months  Pt seen for MST. BMI indicates underweight status. Unable to see pt x3 attempted visits due to pt care, pt working with other staff members.   Per chart review, pt's daughter states that pt has not been eating well lately and has lost weight in the past 6 months. Per chart review, pt has lost 13 lbs (12% body weight) in the past 6.5 months which is significant for time  frame. Pt likely meets criteria for severe malnutrition in the setting of chronic illness but unable to confirm at this time.  Will perform physical assessment and obtain all information at follow-up. No intakes documented since admission. Medications reviewed. Labs reviewed; creatinine elevated, GFR: 55.   Diet Order:  Diet Heart Room service appropriate?: Yes; Fluid consistency:: Thin  Skin:  Reviewed, no issues  Last BM:  9/30  Height:   Ht Readings from Last 1 Encounters:  07/29/15 '5\' 4"'$  (1.626 m)    Weight:   Wt Readings from Last 1 Encounters:  07/29/15 94 lb 12.8 oz (43 kg)    Ideal Body Weight:  54.54 kg (kg)  BMI:  Body mass index is 16.26 kg/(m^2).  Estimated Nutritional Needs:   Kcal:  1300-1500  Protein:  50-60 grams  Fluid:  1.8-2.2 L/day  EDUCATION NEEDS:   No education needs identified at this time     Jarome Matin, RD, LDN Inpatient Clinical Dietitian Pager # [phone] After hours/weekend pager # [phone]

## 2015-07-29 NOTE — Progress Notes (Signed)
ANTIBIOTIC CONSULT NOTE - INITIAL  Pharmacy Consult for Rocephin Indication: UTI  Allergies  Allergen Reactions  . Clarithromycin Nausea And Vomiting and Nausea Only  . Bee Venom     Patient Measurements: Height: '5\' 4"'$  (162.6 cm) Weight: 94 lb 12.8 oz (43 kg) IBW/kg (Calculated) : 54.7   Vital Signs: Temp: 97.9 F (36.6 C) (09/30 0307) Temp Source: Oral (09/30 0307) BP: 196/76 mmHg (09/30 0307) Pulse Rate: 93 (09/30 0307) Intake/Output from previous day: 09/29 0701 - 09/30 0700 In: 1000 [I.V.:1000] Out: -  Intake/Output from this shift: Total I/O In: 1000 [I.V.:1000] Out: -   Labs:  Recent Labs  07/28/15 2230  WBC 8.9  HGB 13.2  PLT 224  CREATININE 1.21*   Estimated Creatinine Clearance: 29.4 mL/min (by C-G formula based on Cr of 1.21). No results for input(s): VANCOTROUGH, VANCOPEAK, VANCORANDOM, GENTTROUGH, GENTPEAK, GENTRANDOM, TOBRATROUGH, TOBRAPEAK, TOBRARND, AMIKACINPEAK, AMIKACINTROU, AMIKACIN in the last 72 hours.   Microbiology: No results found for this or any previous visit (from the past 720 hour(s)).  Medical History: Past Medical History  Diagnosis Date  . Asthma   . HTN (hypertension)   . Grave's disease   . Fibromyalgia   . Thrombophlebitis   . Depression   . Angina   . Shortness of breath   . COPD (chronic obstructive pulmonary disease)   . Sleep apnea   . Blood dyscrasia     hx of thrombphlebitis  . Recurrent upper respiratory infection (URI)   . GERD (gastroesophageal reflux disease)   . Headache(784.0)   . Arthritis   . CAD (coronary artery disease)   . Chronic kidney disease   . Peripheral vascular disease   . Diabetes mellitus   . Ulcer   . Diarrhea     chronic   . Thyroid disorder   . Anxiety and depression   . Chronic pain     leg and feet  . DDD (degenerative disc disease)   . Rhinitis   . OP (osteoporosis)   . Vitamin B 12 deficiency   . PUD (peptic ulcer disease)   . DDD (degenerative disc disease)   .  Spinal stenosis   . Spinal stenosis of lumbar region 04/23/2013  . Tobacco use disorder 04/23/2013  . DVT (deep venous thrombosis)   . Carotid artery occlusion   . Parkinson's disease   . Dry mouth     Medications:  Prescriptions prior to admission  Medication Sig Dispense Refill Last Dose  . aspirin 81 MG tablet Take 81 mg by mouth daily.     Taking  . CAFFEINE PO Take 1 tablet by mouth daily.     . clonazePAM (KLONOPIN) 1 MG tablet Take 1 mg by mouth 2 (two) times daily as needed for anxiety (anxiety). For anxiety   Taking  . CRANBERRY PO Take 1 capsule by mouth daily.     . cyclobenzaprine (FLEXERIL) 5 MG tablet Take 5 mg by mouth at bedtime.    Taking  . diphenhydrAMINE (BENADRYL) 25 MG tablet Take 25 mg by mouth every 6 (six) hours as needed.     . DULoxetine (CYMBALTA) 60 MG capsule Take 60 mg by mouth daily.     Taking  . furosemide (LASIX) 20 MG tablet Take 20 mg by mouth daily.     [name] glycopyrrolate (ROBINUL) 2 MG tablet Take 2 mg by mouth every 8 (eight) hours as needed.     . hyoscyamine (LEVSIN SL) 0.125 MG SL tablet Place 0.125 mg  under the tongue every 4 (four) hours as needed.     [name] ibuprofen (ADVIL,MOTRIN) 200 MG tablet Take 200 mg by mouth every 6 (six) hours as needed.     . metoCLOPramide (REGLAN) 5 MG tablet Take 5 mg by mouth 4 (four) times daily as needed (stomach pain). Take 1 tablet 30 minutes before meals and at bedtime   Taking  . ondansetron (ZOFRAN) 8 MG tablet Take 8 mg by mouth every 8 (eight) hours as needed for nausea or vomiting.     . pantoprazole (PROTONIX) 40 MG tablet Take 40 mg by mouth 2 (two) times daily.    Taking  . potassium chloride SA (K-DUR,KLOR-CON) 20 MEQ tablet Take 20 mEq by mouth daily.     . predniSONE (DELTASONE) 5 MG tablet Take 5 mg by mouth every other day.    07/27/2015 at Unknown time  . promethazine (PHENERGAN) 25 MG tablet Take 25 mg by mouth every 6 (six) hours as needed for nausea (nausea).    Taking  . simvastatin (ZOCOR) 20 MG  tablet Take 20 mg by mouth at bedtime.     [name] acetaminophen (TYLENOL) 325 MG tablet Take 650 mg by mouth every 6 (six) hours as needed for moderate pain (pain).   Taking  . albuterol-ipratropium (COMBIVENT) 18-103 MCG/ACT inhaler Inhale 2 puffs into the lungs every 6 (six) hours as needed for shortness of breath (sob). For shortness of breath   Taking  . amLODipine (NORVASC) 5 MG tablet Take 5 mg by mouth daily.     Taking  . calcium acetate (PHOSLO) 667 MG capsule Take 667 mg by mouth 3 (three) times daily with meals.    Taking  . dicyclomine (BENTYL) 20 MG tablet Take 20 mg by mouth 4 (four) times daily.    Taking  . fentaNYL (DURAGESIC - DOSED MCG/HR) 100 MCG/HR Place 100 mcg onto the skin every other day.   Taking  . HYDROcodone-acetaminophen (NORCO/VICODIN) 5-325 MG per tablet Take 1-2 tablets by mouth every 6 (six) hours as needed for moderate pain. (Patient not taking: Reported on 07/06/2015) 15 tablet 0 Not Taking  . nitroGLYCERIN (NITROSTAT) 0.4 MG SL tablet Place 0.4 mg under the tongue every 5 (five) minutes as needed. For chest pain    Taking  . oxyCODONE (ROXICODONE) 15 MG immediate release tablet Take 15 mg by mouth every 4 (four) hours as needed for pain (pain).    Taking  . Vitamin D, Ergocalciferol, (DRISDOL) 50000 UNITS CAPS Take 50,000 Units by mouth every 7 (seven) days.    Taking   Scheduled:  . amLODipine  5 mg Oral Daily  . aspirin  81 mg Oral Daily  . calcium acetate  667 mg Oral TID WC  . cefTRIAXone (ROCEPHIN)  IV  1 g Intravenous Once  . [START ON 07/30/2015] cefTRIAXone (ROCEPHIN)  IV  1 g Intravenous Q24H  . dicyclomine  20 mg Oral QID  . DULoxetine  60 mg Oral Daily  . fentaNYL  100 mcg Transdermal QODAY  . folic acid  1 mg Oral Daily  . heparin  5,000 Units Subcutaneous 3 times per day  . insulin aspart  0-15 Units Subcutaneous TID WC  . multivitamin with minerals  1 tablet Oral Daily  . pantoprazole  40 mg Oral BID  . predniSONE  5 mg Oral QODAY  . simvastatin   20 mg Oral QHS  . sodium chloride  3 mL Intravenous Q12H  . thiamine  100 mg Oral  Daily  . Vitamin D (Ergocalciferol)  50,000 Units Oral Q7 days   Infusions:  . sodium chloride     Assessment: 22 yoF c/o falling found to have UTI. Rocephin per Rx.  Goal of Therapy:  Treat UTI  Plan:   Rocephin 1 Gm IV q24h  F/u cultures  Dorrene German 07/29/2015,4:25 AM

## 2015-07-29 NOTE — ED Notes (Signed)
Assisted pt x 2 to the restroom because she thought she had to urinate, unable both times. Pt daughter is at the bedside, aware of plans for admission

## 2015-07-30 ENCOUNTER — Inpatient Hospital Stay (HOSPITAL_COMMUNITY): Payer: Medicare Other

## 2015-07-30 DIAGNOSIS — F329 Major depressive disorder, single episode, unspecified: Secondary | ICD-10-CM

## 2015-07-30 DIAGNOSIS — J449 Chronic obstructive pulmonary disease, unspecified: Secondary | ICD-10-CM

## 2015-07-30 DIAGNOSIS — E785 Hyperlipidemia, unspecified: Secondary | ICD-10-CM

## 2015-07-30 DIAGNOSIS — K219 Gastro-esophageal reflux disease without esophagitis: Secondary | ICD-10-CM

## 2015-07-30 DIAGNOSIS — N179 Acute kidney failure, unspecified: Secondary | ICD-10-CM

## 2015-07-30 DIAGNOSIS — I10 Essential (primary) hypertension: Secondary | ICD-10-CM

## 2015-07-30 DIAGNOSIS — F32A Depression, unspecified: Secondary | ICD-10-CM | POA: Insufficient documentation

## 2015-07-30 LAB — GLUCOSE, CAPILLARY
Glucose-Capillary: 101 mg/dL — ABNORMAL HIGH (ref 65–99)
Glucose-Capillary: 163 mg/dL — ABNORMAL HIGH (ref 65–99)

## 2015-07-30 LAB — HEMOGLOBIN A1C
Hgb A1c MFr Bld: 5.4 % (ref 4.8–5.6)
Mean Plasma Glucose: 108 mg/dL

## 2015-07-30 MED ORDER — ENSURE ENLIVE PO LIQD: 237.0000 mL | Freq: Two times a day (BID) | ORAL | Status: DC

## 2015-07-30 MED ORDER — GADOBENATE DIMEGLUMINE 529 MG/ML IV SOLN
10.0000 mL | Freq: Once | INTRAVENOUS | Status: AC | PRN
  Administered 2015-07-30: 8 mL via INTRAVENOUS

## 2015-07-30 NOTE — Discharge Summary (Signed)
Physician Discharge Summary  [name] YJE:[id number] DOB: June 09, 1946 DOA: 07/28/2015  PCP: Thressa Sheller, MD  Admit date: 07/28/2015 Discharge date: 07/30/2015  Recommendations for Outpatient Follow-up:  1. Please hold lasix until you are seen by PCP who can recheck your kidney function and if if kidney function is stable then you may resume lasix.  Discharge Diagnoses:  Active Problems:   UTI (lower urinary tract infection)   COPD (chronic obstructive pulmonary disease)   Diabetes mellitus with neurological manifestation   Frequent falls   HTN (hypertension)   AKI (acute kidney injury)   Sepsis    Discharge Condition: stable; patient refused placement to SNF; [place] orders placed   Diet recommendation: as tolerated   History of present illness:  69 y.o. female with past medical history of COPD, hypertension, dyslipidemia, fibromyalgia who presented to [facility] ED with reports of frequent falls. She apparently has been taking narcotics but not in last few months prior to this admission. She was found by her daughter lying on the kitchen floor and then she had apparently another fall.   On admission, she was hemodynamically stable. She was found to have UTI on admission and was started on empiric rocephin.   Hospital Course:   Principal Problem: Frequent falls - Unclear etiology, perhaps due to polypharmacy - CT head showed no acute intracranial findings - MRI brain is pending and we will update the findings. - UDS on admission positive for opiates and benzos  - PT recommended SNF but patient refused so [place] orders placed.  Active Problems:   UTI (lower urinary tract infection) - Has received 3 doses of IV rocephin - Urine culture not obtained at the time of the admission - No urinary complaints - Abx not needed on discharge     COPD (chronic obstructive pulmonary disease) - Stable, not in exacerbation - Prednisone as per prior to this admission    HTN (hypertension),  essential  - Continue Norvasc - Hold lasix due to acute renal failure     AKI (acute kidney injury) - Likely due to lasix - Lasix on hold - Instructed that pt sees PCP who will recheck renal function and if stable she may resume lasix    Dyslipidemia - Continue statin therapy     Fibromyalgia / depression / anxiety  - Continue Cymbalta  - Continue clonazepam     Smoking - Smoking cessation provided   Signed:  Leisa Lenz, MD  [facility] 07/30/2015, 10:31 AM  Pager #: [phone]  Time spent in minutes: more than 30 minutes  Procedures:  None   Consultations:  None   Discharge Exam: Filed Vitals:   07/30/15 0602  BP: 142/69  Pulse: 84  Temp: 97.7 F (36.5 C)  Resp: 10   Filed Vitals:   07/29/15 1050 07/29/15 1427 07/29/15 2118 07/30/15 0602  BP: 171/85 175/87 140/79 142/69  Pulse:  105 102 84  Temp:  98.4 F (36.9 C) 98.5 F (36.9 C) 97.7 F (36.5 C)  TempSrc:  Oral Oral Oral  Resp:  '21 22 10  '$ Height:      Weight:      SpO2:  99% 97% 95%    General: Pt is alert, follows commands appropriately, not in acute distress Cardiovascular: Regular rate and rhythm, S1/S2 +, no murmurs Respiratory: Clear to auscultation bilaterally, no wheezing, no crackles, no rhonchi Abdominal: Soft, non tender, non distended, bowel sounds +, no guarding Extremities: no edema, no cyanosis, pulses palpable bilaterally DP and PT Neuro:  Grossly nonfocal  Discharge Instructions  Discharge Instructions    Call MD for:  difficulty breathing, headache or visual disturbances    Complete by:  As directed      Call MD for:  persistant nausea and vomiting    Complete by:  As directed      Call MD for:  severe uncontrolled pain    Complete by:  As directed      Diet - low sodium heart healthy    Complete by:  As directed      Discharge instructions    Complete by:  As directed   1. Please hold lasix until you are seen by PCP who can recheck your kidney function and  if if kidney function is stable then you may resume lasix.     Increase activity slowly    Complete by:  As directed             Medication List    STOP taking these medications        CAFFEINE PO     furosemide 20 MG tablet  Commonly known as:  LASIX     ibuprofen 200 MG tablet  Commonly known as:  ADVIL,MOTRIN      TAKE these medications        acetaminophen 325 MG tablet  Commonly known as:  TYLENOL  Take 650 mg by mouth every 6 (six) hours as needed for moderate pain (pain).     albuterol-ipratropium 18-103 MCG/ACT inhaler  Commonly known as:  COMBIVENT  Inhale 2 puffs into the lungs every 6 (six) hours as needed for shortness of breath (sob). For shortness of breath     amLODipine 5 MG tablet  Commonly known as:  NORVASC  Take 5 mg by mouth daily.     aspirin 81 MG tablet  Take 81 mg by mouth daily.     calcium acetate 667 MG capsule  Commonly known as:  PHOSLO  Take 667 mg by mouth 3 (three) times daily with meals.     clonazePAM 1 MG tablet  Commonly known as:  KLONOPIN  Take 1 mg by mouth 2 (two) times daily as needed for anxiety (anxiety). For anxiety     CRANBERRY PO  Take 1 capsule by mouth daily.     cyclobenzaprine 10 MG tablet  Commonly known as:  FLEXERIL  Take 10 mg by mouth at bedtime.     dicyclomine 20 MG tablet  Commonly known as:  BENTYL  Take 20 mg by mouth 4 (four) times daily.     diphenhydrAMINE 25 MG tablet  Commonly known as:  BENADRYL  Take 25 mg by mouth every 6 (six) hours as needed.     DULoxetine 60 MG capsule  Commonly known as:  CYMBALTA  Take 60 mg by mouth daily.     feeding supplement (ENSURE ENLIVE) Liqd  Take 237 mLs by mouth 2 (two) times daily between meals.     fentaNYL 100 MCG/HR  Commonly known as:  DURAGESIC - dosed mcg/hr  Place 100 mcg onto the skin every other day.     glycopyrrolate 2 MG tablet  Commonly known as:  ROBINUL  Take 2 mg by mouth every 8 (eight) hours as needed.     hyoscyamine  0.125 MG SL tablet  Commonly known as:  LEVSIN SL  Place 0.125 mg under the tongue every 4 (four) hours as needed.     lidocaine 5 %  Commonly known as:  LIDODERM  Place 1 patch onto the skin daily. Remove & Discard patch within 12 hours or as directed by MD     metoCLOPramide 5 MG tablet  Commonly known as:  REGLAN  Take 5 mg by mouth 4 (four) times daily as needed (stomach pain). Take 1 tablet 30 minutes before meals and at bedtime     nitroGLYCERIN 0.4 MG SL tablet  Commonly known as:  NITROSTAT  Place 0.4 mg under the tongue every 5 (five) minutes as needed. For chest pain     ondansetron 8 MG tablet  Commonly known as:  ZOFRAN  Take 8 mg by mouth every 8 (eight) hours as needed for nausea or vomiting.     oxyCODONE 15 MG immediate release tablet  Commonly known as:  ROXICODONE  Take 15 mg by mouth 5 (five) times daily as needed for pain (pain).     pantoprazole 40 MG tablet  Commonly known as:  PROTONIX  Take 40 mg by mouth 2 (two) times daily.     potassium chloride SA 20 MEQ tablet  Commonly known as:  K-DUR,KLOR-CON  Take 20 mEq by mouth daily.     predniSONE 5 MG tablet  Commonly known as:  DELTASONE  Take 5 mg by mouth every other day.     promethazine 25 MG tablet  Commonly known as:  PHENERGAN  Take 25 mg by mouth every 6 (six) hours as needed for nausea (nausea).     simvastatin 20 MG tablet  Commonly known as:  ZOCOR  Take 20 mg by mouth at bedtime.     Vitamin D (Ergocalciferol) 50000 UNITS Caps capsule  Commonly known as:  DRISDOL  Take 50,000 Units by mouth every 7 (seven) days.           Follow-up Information    Follow up with Thressa Sheller, MD. Schedule an appointment as soon as possible for a visit in 1 week.   Specialty:  Internal Medicine   Why:  Follow up appt after recent hospitalization   Contact information:   [address] [place] [address] [phone]        The results of significant diagnostics from  this hospitalization (including imaging, microbiology, ancillary and laboratory) are listed below for reference.    Significant Diagnostic Studies: Dg Chest 2 View  07/28/2015   CLINICAL DATA:  Cough for 1 week.  Frequent falls.  EXAM: CHEST  2 VIEW  COMPARISON:  05/24/2015  FINDINGS: Lungs are hyperinflated with emphysema. Cardiomediastinal contours are unchanged, heart size is normal. Mild tortuosity of the thoracic aorta. Linear atelectasis or scarring at the left lung base. No pulmonary edema, consolidation, pleural effusion or pneumothorax. Skin folds project over both hemithoraces. The bones are under mineralized.  IMPRESSION: Emphysema without acute pulmonary process.   Electronically Signed   By: Jeb Levering M.D.   On: 07/28/2015 23:45   Ct Head Wo Contrast  07/28/2015   CLINICAL DATA:  Fall with headache.  EXAM: CT HEAD WITHOUT CONTRAST  TECHNIQUE: Contiguous axial images were obtained from the base of the skull through the vertex without intravenous contrast.  COMPARISON:  03/01/2015  FINDINGS: Skull and Sinuses:Negative for fracture or destructive process. The mastoids, middle ears, and imaged paranasal sinuses are clear.  Orbits: Bilateral cataract resection.  No traumatic finding.  Brain: No evidence of acute infarction, hemorrhage, obstructive hydrocephalus, or mass lesion/mass effect.  Chronic vessel disease with ischemic gliosis confluent around the lateral ventricles. Chronic ventriculomegaly, likely central  predominant atrophy.  IMPRESSION: No intracranial injury or fracture.   Electronically Signed   By: Monte Fantasia M.D.   On: 07/28/2015 22:59   Dg Chest Port 1 View  07/29/2015   CLINICAL DATA:  Sepsis.  EXAM: PORTABLE CHEST - 1 VIEW  COMPARISON:  None.  FINDINGS: The heart size is normal. Emphysematous changes are again noted. Surgical clips are present at the GE junction. The visualized soft tissues and bony thorax are unremarkable.  IMPRESSION: 1. No acute cardiopulmonary  disease. 2. Emphysema.   Electronically Signed   By: San Morelle M.D.   On: 07/29/2015 07:59    Microbiology: No results found for this or any previous visit (from the past 240 hour(s)).   Labs: Basic Metabolic Panel:  Recent Labs Lab 07/28/15 2230 07/29/15 0430  NA 141 141  K 4.5 3.9  CL 107 107  CO2 24 23  GLUCOSE 107* 123*  BUN 20 17  CREATININE 1.21* 1.01*  CALCIUM 9.9 9.1   Liver Function Tests:  Recent Labs Lab 07/28/15 2230 07/29/15 0430  AST 21 21  ALT 12* 10*  ALKPHOS 102 93  BILITOT 0.8 0.5  PROT 7.2 6.4*  ALBUMIN 4.5 3.7   No results for input(s): LIPASE, AMYLASE in the last 168 hours. No results for input(s): AMMONIA in the last 168 hours. CBC:  Recent Labs Lab 07/28/15 2230 07/29/15 0430  WBC 8.9 8.9  NEUTROABS  --  7.9*  HGB 13.2 13.1  HCT 40.2 40.0  MCV 96.6 96.4  PLT 224 233   Cardiac Enzymes:  Recent Labs Lab 07/29/15 0136 07/29/15 0710 07/29/15 1414  TROPONINI <0.03 <0.03 0.03   BNP: BNP (last 3 results) No results for input(s): BNP in the last 8760 hours.  ProBNP (last 3 results) No results for input(s): PROBNP in the last 8760 hours.  CBG:  Recent Labs Lab 07/29/15 0752 07/29/15 1144 07/29/15 1640 07/29/15 2117 07/30/15 0737  GLUCAP 103* 118* 121* 126* 101*

## 2015-07-30 NOTE — Progress Notes (Signed)
Completed D/C teaching with patient and family. Answered all questions. Rolling walker arrived. Pt was D/C with walker. Patient is D/C home with [facility] with family.  Cathie Hoops, RN

## 2015-07-30 NOTE — Discharge Instructions (Signed)

## 2015-07-30 NOTE — Care Management Note (Signed)
Case Management Note  Patient Details  Name: [name] MRN: [id number] Date of Birth: April 20, 1946  Subjective/Objective:        UTI, falls            Action/Plan:  Home Health Expected Discharge Date:  07/30/2015             Expected Discharge Plan:  [place]  In-House Referral:     Discharge planning Services  CM Consult  Post Acute Care Choice:  Home Health Choice offered to:  Patient  DME Arranged:  Walker rolling with seat DME Agency:  [place] Arranged:  RN, PT, OT, Social Work [facility] Agency:  [facility]  Status of Service:  Completed, signed off  Medicare Important Message Given:    Date Medicare IM Given:    Medicare IM give by:    Date Additional Medicare IM Given:    Additional Medicare Important Message give by:     If discussed at [place] of Stay Meetings, dates discussed:    Additional Comments: NCM spoke to pt and gave permission to speak to dtr, [name] # [phone]. Pt lives at home with friend, [name] that assist pt as she needs. Pt reports having a aide from [employer] that comes 5 days per week for 2 hours. She has cane at home. Requesting RW with seat for home. Contacted [facility] for Roll[facility] for home. Offered choice for [facility] [place]. Dtr agreeable to [place] for home. Contacted [facility] rep and [place] arranged.     [name], RN 07/30/2015, 1:33 PM

## 2015-08-03 LAB — CULTURE, BLOOD (ROUTINE X 2)
Culture: NO GROWTH
Culture: NO GROWTH

## 2015-12-27 ENCOUNTER — Encounter: Payer: Self-pay | Admitting: Vascular Surgery

## 2016-01-04 ENCOUNTER — Ambulatory Visit: Payer: Medicare Other | Admitting: Vascular Surgery

## 2016-01-04 ENCOUNTER — Encounter (HOSPITAL_COMMUNITY): Payer: Medicare Other

## 2016-01-25 ENCOUNTER — Encounter (HOSPITAL_COMMUNITY): Payer: Self-pay | Admitting: Emergency Medicine

## 2016-01-25 ENCOUNTER — Observation Stay (HOSPITAL_COMMUNITY)
Admission: EM | Admit: 2016-01-25 | Discharge: 2016-02-02 | Disposition: A | Discharge status: Other Acute Inpatient Hospital | Payer: Medicare Other | Attending: Internal Medicine | Admitting: Internal Medicine

## 2016-01-25 DIAGNOSIS — E43 Unspecified severe protein-calorie malnutrition: Secondary | ICD-10-CM | POA: Insufficient documentation

## 2016-01-25 DIAGNOSIS — D649 Anemia, unspecified: Secondary | ICD-10-CM | POA: Diagnosis not present

## 2016-01-25 DIAGNOSIS — F17210 Nicotine dependence, cigarettes, uncomplicated: Secondary | ICD-10-CM | POA: Insufficient documentation

## 2016-01-25 DIAGNOSIS — I129 Hypertensive chronic kidney disease with stage 1 through stage 4 chronic kidney disease, or unspecified chronic kidney disease: Secondary | ICD-10-CM | POA: Diagnosis not present

## 2016-01-25 DIAGNOSIS — M4806 Spinal stenosis, lumbar region: Secondary | ICD-10-CM | POA: Insufficient documentation

## 2016-01-25 DIAGNOSIS — E860 Dehydration: Secondary | ICD-10-CM | POA: Insufficient documentation

## 2016-01-25 DIAGNOSIS — M1990 Unspecified osteoarthritis, unspecified site: Secondary | ICD-10-CM | POA: Diagnosis not present

## 2016-01-25 DIAGNOSIS — J449 Chronic obstructive pulmonary disease, unspecified: Secondary | ICD-10-CM | POA: Diagnosis not present

## 2016-01-25 DIAGNOSIS — E1149 Type 2 diabetes mellitus with other diabetic neurological complication: Secondary | ICD-10-CM | POA: Diagnosis not present

## 2016-01-25 DIAGNOSIS — M797 Fibromyalgia: Secondary | ICD-10-CM | POA: Diagnosis not present

## 2016-01-25 DIAGNOSIS — F419 Anxiety disorder, unspecified: Secondary | ICD-10-CM | POA: Diagnosis not present

## 2016-01-25 DIAGNOSIS — T402X2A Poisoning by other opioids, intentional self-harm, initial encounter: Principal | ICD-10-CM | POA: Insufficient documentation

## 2016-01-25 DIAGNOSIS — M549 Dorsalgia, unspecified: Secondary | ICD-10-CM | POA: Diagnosis not present

## 2016-01-25 DIAGNOSIS — E1151 Type 2 diabetes mellitus with diabetic peripheral angiopathy without gangrene: Secondary | ICD-10-CM | POA: Diagnosis not present

## 2016-01-25 DIAGNOSIS — G8929 Other chronic pain: Secondary | ICD-10-CM | POA: Diagnosis not present

## 2016-01-25 DIAGNOSIS — N179 Acute kidney failure, unspecified: Secondary | ICD-10-CM | POA: Diagnosis not present

## 2016-01-25 DIAGNOSIS — Z9049 Acquired absence of other specified parts of digestive tract: Secondary | ICD-10-CM | POA: Diagnosis not present

## 2016-01-25 DIAGNOSIS — I2510 Atherosclerotic heart disease of native coronary artery without angina pectoris: Secondary | ICD-10-CM | POA: Diagnosis not present

## 2016-01-25 DIAGNOSIS — Z79891 Long term (current) use of opiate analgesic: Secondary | ICD-10-CM | POA: Diagnosis not present

## 2016-01-25 DIAGNOSIS — N189 Chronic kidney disease, unspecified: Secondary | ICD-10-CM | POA: Insufficient documentation

## 2016-01-25 DIAGNOSIS — M48061 Spinal stenosis, lumbar region without neurogenic claudication: Secondary | ICD-10-CM | POA: Diagnosis present

## 2016-01-25 DIAGNOSIS — E1122 Type 2 diabetes mellitus with diabetic chronic kidney disease: Secondary | ICD-10-CM | POA: Insufficient documentation

## 2016-01-25 DIAGNOSIS — G20 Parkinson's disease: Secondary | ICD-10-CM | POA: Diagnosis not present

## 2016-01-25 DIAGNOSIS — T1491 Suicide attempt: Secondary | ICD-10-CM | POA: Diagnosis not present

## 2016-01-25 DIAGNOSIS — Z7982 Long term (current) use of aspirin: Secondary | ICD-10-CM | POA: Insufficient documentation

## 2016-01-25 DIAGNOSIS — Z903 Acquired absence of stomach [part of]: Secondary | ICD-10-CM | POA: Diagnosis not present

## 2016-01-25 DIAGNOSIS — Z86718 Personal history of other venous thrombosis and embolism: Secondary | ICD-10-CM | POA: Diagnosis not present

## 2016-01-25 DIAGNOSIS — Z79899 Other long term (current) drug therapy: Secondary | ICD-10-CM | POA: Diagnosis not present

## 2016-01-25 DIAGNOSIS — G4730 Sleep apnea, unspecified: Secondary | ICD-10-CM | POA: Insufficient documentation

## 2016-01-25 DIAGNOSIS — T40601A Poisoning by unspecified narcotics, accidental (unintentional), initial encounter: Secondary | ICD-10-CM | POA: Diagnosis present

## 2016-01-25 DIAGNOSIS — F329 Major depressive disorder, single episode, unspecified: Secondary | ICD-10-CM | POA: Insufficient documentation

## 2016-01-25 DIAGNOSIS — E785 Hyperlipidemia, unspecified: Secondary | ICD-10-CM | POA: Diagnosis not present

## 2016-01-25 DIAGNOSIS — T40602A Poisoning by unspecified narcotics, intentional self-harm, initial encounter: Secondary | ICD-10-CM | POA: Diagnosis not present

## 2016-01-25 DIAGNOSIS — Z7952 Long term (current) use of systemic steroids: Secondary | ICD-10-CM | POA: Insufficient documentation

## 2016-01-25 DIAGNOSIS — E119 Type 2 diabetes mellitus without complications: Secondary | ICD-10-CM

## 2016-01-25 DIAGNOSIS — K219 Gastro-esophageal reflux disease without esophagitis: Secondary | ICD-10-CM | POA: Diagnosis not present

## 2016-01-25 DIAGNOSIS — T1491XA Suicide attempt, initial encounter: Secondary | ICD-10-CM | POA: Diagnosis present

## 2016-01-25 LAB — COMPREHENSIVE METABOLIC PANEL
ALT: 19 U/L (ref 14–54)
AST: 16 U/L (ref 15–41)
Albumin: 2.9 g/dL — ABNORMAL LOW (ref 3.5–5.0)
Alkaline Phosphatase: 95 U/L (ref 38–126)
Anion gap: 9 (ref 5–15)
BUN: 32 mg/dL — ABNORMAL HIGH (ref 6–20)
CO2: 22 mmol/L (ref 22–32)
Calcium: 8.5 mg/dL — ABNORMAL LOW (ref 8.9–10.3)
Chloride: 114 mmol/L — ABNORMAL HIGH (ref 101–111)
Creatinine, Ser: 1.6 mg/dL — ABNORMAL HIGH (ref 0.44–1.00)
GFR calc Af Amer: 37 mL/min — ABNORMAL LOW (ref >60)
GFR calc non Af Amer: 32 mL/min — ABNORMAL LOW (ref >60)
Glucose, Bld: 106 mg/dL — ABNORMAL HIGH (ref 65–99)
Potassium: 4.1 mmol/L (ref 3.5–5.1)
Sodium: 145 mmol/L (ref 135–145)
Total Bilirubin: 0.3 mg/dL (ref 0.3–1.2)
Total Protein: 5.8 g/dL — ABNORMAL LOW (ref 6.5–8.1)

## 2016-01-25 LAB — CBC WITH DIFFERENTIAL/PLATELET
Basophils Absolute: 0 10*3/uL (ref 0.0–0.1)
Basophils Relative: 0 %
Eosinophils Absolute: 0 10*3/uL (ref 0.0–0.7)
Eosinophils Relative: 0 %
HCT: 32.4 % — ABNORMAL LOW (ref 36.0–46.0)
Hemoglobin: 10.6 g/dL — ABNORMAL LOW (ref 12.0–15.0)
Lymphocytes Relative: 12 %
Lymphs Abs: 0.5 10*3/uL — ABNORMAL LOW (ref 0.7–4.0)
MCH: 30.2 pg (ref 26.0–34.0)
MCHC: 32.7 g/dL (ref 30.0–36.0)
MCV: 92.3 fL (ref 78.0–100.0)
Monocytes Absolute: 0.4 10*3/uL (ref 0.1–1.0)
Monocytes Relative: 11 %
Neutro Abs: 2.9 10*3/uL (ref 1.7–7.7)
Neutrophils Relative %: 77 %
Platelets: 199 10*3/uL (ref 150–400)
RBC: 3.51 MIL/uL — ABNORMAL LOW (ref 3.87–5.11)
RDW: 14.6 % (ref 11.5–15.5)
WBC: 3.7 10*3/uL — ABNORMAL LOW (ref 4.0–10.5)

## 2016-01-25 LAB — RAPID URINE DRUG SCREEN, HOSP PERFORMED
Amphetamines: NOT DETECTED
Barbiturates: NOT DETECTED
Benzodiazepines: POSITIVE — AB
Cocaine: NOT DETECTED
Opiates: POSITIVE — AB
Tetrahydrocannabinol: NOT DETECTED

## 2016-01-25 LAB — ACETAMINOPHEN LEVEL: Acetaminophen (Tylenol), Serum: <10 ug/mL — ABNORMAL LOW (ref 10–30)

## 2016-01-25 LAB — ETHANOL: Alcohol, Ethyl (B): <5 mg/dL (ref <5)

## 2016-01-25 LAB — SALICYLATE LEVEL: Salicylate Lvl: <4 mg/dL (ref 2.8–30.0)

## 2016-01-25 LAB — MRSA PCR SCREENING: MRSA by PCR: NEGATIVE

## 2016-01-25 MED ORDER — INSULIN ASPART 100 UNIT/ML ~~LOC~~ SOLN: 0.0000 [IU] | SUBCUTANEOUS | Status: DC

## 2016-01-25 MED ORDER — ONDANSETRON HCL 4 MG/2ML IJ SOLN: 4.0000 mg | Freq: Four times a day (QID) | INTRAMUSCULAR | Status: DC | PRN

## 2016-01-25 MED ORDER — SODIUM CHLORIDE 0.9 % IV BOLUS (SEPSIS)
1000.0000 mL | Freq: Once | INTRAVENOUS | Status: AC
  Administered 2016-01-25: 1000 mL via INTRAVENOUS

## 2016-01-25 MED ORDER — SODIUM CHLORIDE 0.9 % IV SOLN
INTRAVENOUS | Status: DC
  Administered 2016-01-25: 22:00:00 via INTRAVENOUS

## 2016-01-25 MED ORDER — HYDRALAZINE HCL 20 MG/ML IJ SOLN
5.0000 mg | INTRAMUSCULAR | Status: DC | PRN
  Administered 2016-01-25: 5 mg via INTRAVENOUS
  Filled 2016-01-25: qty 1

## 2016-01-25 MED ORDER — ENOXAPARIN SODIUM 30 MG/0.3ML ~~LOC~~ SOLN
30.0000 mg | SUBCUTANEOUS | Status: DC
  Administered 2016-01-25 – 2016-02-01 (×8): 30 mg via SUBCUTANEOUS
  Filled 2016-01-25 (×9): qty 0.3

## 2016-01-25 MED ORDER — ONDANSETRON HCL 4 MG PO TABS: 4.0000 mg | ORAL_TABLET | Freq: Four times a day (QID) | ORAL | Status: DC | PRN

## 2016-01-25 MED ORDER — NALOXONE HCL 0.4 MG/ML IJ SOLN: 0.4000 mg | INTRAMUSCULAR | Status: DC | PRN

## 2016-01-25 NOTE — ED Notes (Signed)
Per EMS, patient told EMS she took between 72 -57 Oxycodone '10mg'$  of rel tabs.  Prescription was filled 01/17/16 for 120 and there is 25 in bottle now.  Patient was found at table with scissors and note pad. She told EMS and family member she wanted to kill herself.  Patient is A & O x 4.   Fire department administered '1mg'$  of Narcan intranasally.   BP:170/85 HR:100 R:14 O2: 100% on room air  CBG:120

## 2016-01-25 NOTE — H&P (Signed)
PCP:   Thressa Sheller, MD   Chief Complaint:  overdose  HPI: 70 yo female h/o chronic back pain, copd, CAD, CKD, DM amoungst others found by her daughter today after overdosing on her pain pills and trying to write a suicide note.  Suspected pt took about 50 of her oxycondone pills.  Pt says she took "handfulls".  She says she got a phone call yesterday from her pain doctor office who told here they were no longer going to treat her pain.  Pt says they gave no reason.  On further questioning, she reveals that her roommate has been taking her pain pills and so her pain doctor thinks she is inappropriately using them.  Pt got very upset about this.  And says she tried to kill herself because "i am tired of being in pain all the time, im miserable".  Pt is up and able to hold a conversation at this time.  She was given narcan '1mg'$  with EMS per nasal and has not had any further narcan since.  Pt referred for admission for opiate overdose with suicidal intent.  Review of Systems:  Positive and negative as per HPI otherwise all other systems are negative  Past Medical History: Past Medical History  Diagnosis Date  . Asthma   . HTN (hypertension)   . Grave's disease   . Fibromyalgia   . Thrombophlebitis   . Depression   . Angina   . Shortness of breath   . COPD (chronic obstructive pulmonary disease) ([place])   . Sleep apnea   . Blood dyscrasia     hx of thrombphlebitis  . Recurrent upper respiratory infection (URI)   . GERD (gastroesophageal reflux disease)   . Headache(784.0)   . Arthritis   . CAD (coronary artery disease)   . Chronic kidney disease   . Peripheral vascular disease ([place])   . Diabetes mellitus   . Ulcer   . Diarrhea     chronic   . Thyroid disorder   . Anxiety and depression   . Chronic pain     leg and feet  . DDD (degenerative disc disease)   . Rhinitis   . OP (osteoporosis)   . Vitamin B 12 deficiency   . PUD (peptic ulcer disease)   . DDD (degenerative disc  disease)   . Spinal stenosis   . Spinal stenosis of lumbar region 04/23/2013  . Tobacco use disorder 04/23/2013  . DVT (deep venous thrombosis) ([place])   . Carotid artery occlusion   . Parkinson's disease ([place])   . Dry mouth    Past Surgical History  Procedure Laterality Date  . Gastrectomy      age 62   Part of small intestin and part of stomach  . Cholecystectomy  1999  . Carotid endarterectomy Left Sept. 20,2011    cea  . Left heart catheterization with coronary angiogram N/A 10/02/2011    Procedure: LEFT HEART CATHETERIZATION WITH CORONARY ANGIOGRAM;  Surgeon: Sueanne Margarita, MD;  Location: [facility]ATH [facility];  Service: Cardiovascular;  Laterality: N/A;    Medications: Prior to Admission medications   Medication Sig Start Date End Date Taking? Authorizing Provider  acetaminophen (TYLENOL) 325 MG tablet Take 650 mg by mouth every 6 (six) hours as needed for moderate pain (pain).   Yes Historical Provider, MD  albuterol-ipratropium (COMBIVENT) 18-103 MCG/ACT inhaler Inhale 2 puffs into the lungs every 6 (six) hours as needed for shortness of breath (sob). For shortness of breath  Historical Provider, MD  amLODipine (NORVASC) 5 MG tablet Take 5 mg by mouth daily.     Yes Historical Provider, MD  aspirin 81 MG tablet Take 81 mg by mouth daily.      Historical Provider, MD  calcium acetate (PHOSLO) 667 MG capsule Take 667 mg by mouth 3 (three) times daily with meals.    Yes Historical Provider, MD  clonazePAM (KLONOPIN) 1 MG tablet Take 1 mg by mouth 2 (two) times daily as needed for anxiety (anxiety). For anxiety   Yes Historical Provider, MD  CRANBERRY PO Take 1 capsule by mouth daily.    Historical Provider, MD  cyclobenzaprine (FLEXERIL) 10 MG tablet Take 10 mg by mouth at bedtime.   Yes Historical Provider, MD  dicyclomine (BENTYL) 20 MG tablet Take 20 mg by mouth 4 (four) times daily.     Historical Provider, MD  diphenhydrAMINE (BENADRYL) 25 MG tablet Take 25 mg by mouth every 6 (six)  hours as needed.    Historical Provider, MD  DULoxetine (CYMBALTA) 60 MG capsule Take 60 mg by mouth daily.      Historical Provider, MD  feeding supplement, ENSURE ENLIVE, (ENSURE ENLIVE) LIQD Take 237 mLs by mouth 2 (two) times daily between meals. 07/30/15   Robbie Lis, MD  fentaNYL (DURAGESIC - DOSED MCG/HR) 100 MCG/HR Place 100 mcg onto the skin every other day.    Historical Provider, MD  glycopyrrolate (ROBINUL) 2 MG tablet Take 2 mg by mouth every 8 (eight) hours as needed.    Historical Provider, MD  hyoscyamine (LEVSIN SL) 0.125 MG SL tablet Place 0.125 mg under the tongue every 4 (four) hours as needed.    Historical Provider, MD  lidocaine (LIDODERM) 5 % Place 1 patch onto the skin daily. Remove & Discard patch within 12 hours or as directed by MD   Yes Historical Provider, MD  metoCLOPramide (REGLAN) 5 MG tablet Take 5 mg by mouth 4 (four) times daily as needed (stomach pain). Take 1 tablet 30 minutes before meals and at bedtime   Yes Historical Provider, MD  nitroGLYCERIN (NITROSTAT) 0.4 MG SL tablet Place 0.4 mg under the tongue every 5 (five) minutes as needed. For chest pain     Historical Provider, MD  ondansetron (ZOFRAN) 8 MG tablet Take 8 mg by mouth every 8 (eight) hours as needed for nausea or vomiting.    Historical Provider, MD  Oxycodone HCl 10 MG TABS Take 10 mg by mouth 4 (four) times daily as needed. Pain 01/17/16  Yes Historical Provider, MD  pantoprazole (PROTONIX) 40 MG tablet Take 40 mg by mouth 2 (two) times daily.    Yes Historical Provider, MD  potassium chloride SA (K-DUR,KLOR-CON) 20 MEQ tablet Take 20 mEq by mouth daily.    Historical Provider, MD  predniSONE (DELTASONE) 5 MG tablet Take 5 mg by mouth every other day.    Yes Historical Provider, MD  promethazine (PHENERGAN) 25 MG tablet Take 25 mg by mouth every 6 (six) hours as needed for nausea (nausea).  11/11/14  Yes Historical Provider, MD  simvastatin (ZOCOR) 20 MG tablet Take 20 mg by mouth at bedtime.     Historical Provider, MD  Vitamin D, Ergocalciferol, (DRISDOL) 50000 UNITS CAPS Take 50,000 Units by mouth every 7 (seven) days.     Historical Provider, MD    Allergies:   Allergies  Allergen Reactions  . Clarithromycin Nausea And Vomiting and Nausea Only  . Bee Venom     Social  History:  reports that she has been smoking Cigarettes.  She has a 100 pack-year smoking history. She has never used smokeless tobacco. She reports that she does not drink alcohol or use illicit drugs.  Family History: Family History  Problem Relation Age of Onset  . Diabetes Mother   . Hyperlipidemia Mother   . Heart attack Mother   . Other Mother     varicose veins,respiratory,stroke  . Heart disease Mother     before age 63  . Hypertension Mother   . Varicose Veins Mother   . Diabetes Father   . Heart disease Father     before age 58  . Hyperlipidemia Father   . Heart attack Father   . Other Father     varicose veins  . Hypertension Father   . Varicose Veins Father   . Diabetes Sister   . Heart disease Sister     before age 20  . Hyperlipidemia Sister   . Heart attack Sister   . Other Sister     varicose veins  . Hypertension Sister   . Varicose Veins Sister   . Peripheral vascular disease Sister   . Diabetes Sister   . Hyperlipidemia Sister   . Hypertension Sister   . Varicose Veins Sister     Physical Exam: Filed Vitals:   01/25/16 1624  BP: 148/72  Pulse: 100  Temp: 98.2 F (36.8 C)  TempSrc: Oral  Resp: 16  SpO2: 97%   General appearance: alert, cooperative and no distress chronically ill appearing.  Tearful and upset. Head: Normocephalic, without obvious abnormality, atraumatic Eyes: negative Nose: Nares normal. Septum midline. Mucosa normal. No drainage or sinus tenderness. Neck: no JVD and supple, symmetrical, trachea midline Lungs: clear to auscultation bilaterally Heart: regular rate and rhythm, S1, S2 normal, no murmur, click, rub or gallop Abdomen: soft,  non-tender; bowel sounds normal; no masses,  no organomegaly Extremities: extremities normal, atraumatic, no cyanosis or edema Pulses: 2+ and symmetric Skin: Skin color, texture, turgor normal. No rashes or lesions Neurologic: Grossly normal    Labs on Admission:   Recent Labs  01/25/16 1700  NA 145  K 4.1  CL 114*  CO2 22  GLUCOSE 106*  BUN 32*  CREATININE 1.60*  CALCIUM 8.5*    Recent Labs  01/25/16 1700  AST 16  ALT 19  ALKPHOS 95  BILITOT 0.3  PROT 5.8*  ALBUMIN 2.9*    Recent Labs  01/25/16 1700  WBC 3.7*  NEUTROABS 2.9  HGB 10.6*  HCT 32.4*  MCV 92.3  PLT 199   Old chart reviewed Case discussed with dr little  Radiological Exams on Admission: No results found.  Assessment/Plan  70 yo female with intentional drug overdose of opiates  Principal Problem:   Opiate overdose-  Will order prn narcan order, wrote if used more than twice to call MD for consideration of narcan gtt.  i suspect she will worsen so will place her in stepdown for close monitoring of respiratory compromise.    Active Problems:   Spinal stenosis of lumbar region- likely source of chronic pain   COPD (chronic obstructive pulmonary disease) (HCC)- stable, compensated   Diabetes mellitus with neurological manifestation ([place])-  Glucose good at this time   AKI (acute kidney injury) ([place])- dehydrated.  Place on ivf and repeat in am   Suicide attempt [facility])- pt will need psych eval prior to discharge.  Suicide sitter needed.  Pt has IVC done by ED already.  Admit to stepdown.  Full code.  Correy Weidner A 01/25/2016, 6:25 PM

## 2016-01-25 NOTE — ED Notes (Signed)
Bed: RESB Expected date:  Expected time:  Means of arrival:  Comments: EMS-OD

## 2016-01-25 NOTE — ED Provider Notes (Signed)
CSN: [id number]     Arrival date & time 01/25/16  1552 History   First MD Initiated Contact with Patient 01/25/16 1556     Chief Complaint  Patient presents with  . Drug Overdose     (Consider location/radiation/quality/duration/timing/severity/associated sxs/prior Treatment) HPI Comments: 2yoF w/ extensive PMH including COPD, CAD, CKD, depression, chronic pain on opiates, Parkinson's who p/w AMS after intentional overdose. History obtained by EMS. EMS reports that the patient took between 32 and 57 oxycodone and milligram tablets earlier today. The patient's daughter found her at her table holding scissors and with a note pad where she was trying to write a suicide note. She told EMS and family members that she wanted to kill herself. Currently, the patient states that her suicidality is new. She received 1 mg Narcan intranasally prior to arrival.  LEVEL 5 CAVEAT 2/2 AMS  Patient is a 70 y.o. female presenting with Overdose. The history is provided by the EMS personnel.  Drug Overdose    Past Medical History  Diagnosis Date  . Asthma   . HTN (hypertension)   . Grave's disease   . Fibromyalgia   . Thrombophlebitis   . Depression   . Angina   . Shortness of breath   . COPD (chronic obstructive pulmonary disease) ([place])   . Sleep apnea   . Blood dyscrasia     hx of thrombphlebitis  . Recurrent upper respiratory infection (URI)   . GERD (gastroesophageal reflux disease)   . Headache(784.0)   . Arthritis   . CAD (coronary artery disease)   . Chronic kidney disease   . Peripheral vascular disease ([place])   . Diabetes mellitus   . Ulcer   . Diarrhea     chronic   . Thyroid disorder   . Anxiety and depression   . Chronic pain     leg and feet  . DDD (degenerative disc disease)   . Rhinitis   . OP (osteoporosis)   . Vitamin B 12 deficiency   . PUD (peptic ulcer disease)   . DDD (degenerative disc disease)   . Spinal stenosis   . Spinal stenosis of lumbar region 04/23/2013   . Tobacco use disorder 04/23/2013  . DVT (deep venous thrombosis) ([place])   . Carotid artery occlusion   . Parkinson's disease ([place])   . Dry mouth    Past Surgical History  Procedure Laterality Date  . Gastrectomy      age 77   Part of small intestin and part of stomach  . Cholecystectomy  1999  . Carotid endarterectomy Left Sept. 20,2011    cea  . Left heart catheterization with coronary angiogram N/A 10/02/2011    Procedure: LEFT HEART CATHETERIZATION WITH CORONARY ANGIOGRAM;  Surgeon: Sueanne Margarita, MD;  Location: [place] [facility]ATH [facility];  Service: Cardiovascular;  Laterality: N/A;   Family History  Problem Relation Age of Onset  . Diabetes Mother   . Hyperlipidemia Mother   . Heart attack Mother   . Other Mother     varicose veins,respiratory,stroke  . Heart disease Mother     before age 10  . Hypertension Mother   . Varicose Veins Mother   . Diabetes Father   . Heart disease Father     before age 46  . Hyperlipidemia Father   . Heart attack Father   . Other Father     varicose veins  . Hypertension Father   . Varicose Veins Father   . Diabetes Sister   .  Heart disease Sister     before age 70  . Hyperlipidemia Sister   . Heart attack Sister   . Other Sister     varicose veins  . Hypertension Sister   . Varicose Veins Sister   . Peripheral vascular disease Sister   . Diabetes Sister   . Hyperlipidemia Sister   . Hypertension Sister   . Varicose Veins Sister    Social History  Substance Use Topics  . Smoking status: Current Every Day Smoker -- 2.00 packs/day for 50 years    Types: Cigarettes  . Smokeless tobacco: Never Used     Comment: cessation info given and reviewed  . Alcohol Use: No   OB History    No data available     Review of Systems  Unable to perform ROS: Mental status change      Allergies  Clarithromycin and Bee venom  Home Medications   Prior to Admission medications   Medication Sig Start Date End Date Taking? Authorizing Provider   acetaminophen (TYLENOL) 325 MG tablet Take 650 mg by mouth every 6 (six) hours as needed for moderate pain (pain).   Yes Historical Provider, MD  albuterol-ipratropium (COMBIVENT) 18-103 MCG/ACT inhaler Inhale 2 puffs into the lungs every 6 (six) hours as needed for shortness of breath (sob). For shortness of breath    Historical Provider, MD  amLODipine (NORVASC) 5 MG tablet Take 5 mg by mouth daily.     Yes Historical Provider, MD  aspirin 81 MG tablet Take 81 mg by mouth daily.      Historical Provider, MD  calcium acetate (PHOSLO) 667 MG capsule Take 667 mg by mouth 3 (three) times daily with meals.    Yes Historical Provider, MD  clonazePAM (KLONOPIN) 1 MG tablet Take 1 mg by mouth 2 (two) times daily as needed for anxiety (anxiety). For anxiety   Yes Historical Provider, MD  CRANBERRY PO Take 1 capsule by mouth daily.    Historical Provider, MD  cyclobenzaprine (FLEXERIL) 10 MG tablet Take 10 mg by mouth at bedtime.   Yes Historical Provider, MD  dicyclomine (BENTYL) 20 MG tablet Take 20 mg by mouth 4 (four) times daily.     Historical Provider, MD  diphenhydrAMINE (BENADRYL) 25 MG tablet Take 25 mg by mouth every 6 (six) hours as needed.    Historical Provider, MD  DULoxetine (CYMBALTA) 60 MG capsule Take 60 mg by mouth daily.      Historical Provider, MD  feeding supplement, ENSURE ENLIVE, (ENSURE ENLIVE) LIQD Take 237 mLs by mouth 2 (two) times daily between meals. 07/30/15   Robbie Lis, MD  fentaNYL (DURAGESIC - DOSED MCG/HR) 100 MCG/HR Place 100 mcg onto the skin every other day.    Historical Provider, MD  glycopyrrolate (ROBINUL) 2 MG tablet Take 2 mg by mouth every 8 (eight) hours as needed.    Historical Provider, MD  hyoscyamine (LEVSIN SL) 0.125 MG SL tablet Place 0.125 mg under the tongue every 4 (four) hours as needed.    Historical Provider, MD  lidocaine (LIDODERM) 5 % Place 1 patch onto the skin daily. Remove & Discard patch within 12 hours or as directed by MD   Yes  Historical Provider, MD  metoCLOPramide (REGLAN) 5 MG tablet Take 5 mg by mouth 4 (four) times daily as needed (stomach pain). Take 1 tablet 30 minutes before meals and at bedtime   Yes Historical Provider, MD  nitroGLYCERIN (NITROSTAT) 0.4 MG SL tablet Place 0.4 mg  under the tongue every 5 (five) minutes as needed. For chest pain     Historical Provider, MD  ondansetron (ZOFRAN) 8 MG tablet Take 8 mg by mouth every 8 (eight) hours as needed for nausea or vomiting.    Historical Provider, MD  Oxycodone HCl 10 MG TABS Take 10 mg by mouth 4 (four) times daily as needed. Pain 01/17/16  Yes Historical Provider, MD  pantoprazole (PROTONIX) 40 MG tablet Take 40 mg by mouth 2 (two) times daily.    Yes Historical Provider, MD  potassium chloride SA (K-DUR,KLOR-CON) 20 MEQ tablet Take 20 mEq by mouth daily.    Historical Provider, MD  predniSONE (DELTASONE) 5 MG tablet Take 5 mg by mouth every other day.    Yes Historical Provider, MD  promethazine (PHENERGAN) 25 MG tablet Take 25 mg by mouth every 6 (six) hours as needed for nausea (nausea).  11/11/14  Yes Historical Provider, MD  simvastatin (ZOCOR) 20 MG tablet Take 20 mg by mouth at bedtime.    Historical Provider, MD  Vitamin D, Ergocalciferol, (DRISDOL) 50000 UNITS CAPS Take 50,000 Units by mouth every 7 (seven) days.     Historical Provider, MD   BP 148/72 mmHg  Pulse 100  Temp(Src) 98.2 F (36.8 C) (Oral)  Resp 16  SpO2 97% Physical Exam  Constitutional: No distress.  Cachectic, chronically ill-appearing woman with eyes closed  HENT:  Head: Normocephalic and atraumatic.  Dry mucous membranes  Eyes: Pupils are equal, round, and reactive to light.  Swollen eyelids bilaterally, proptosis bilaterally  Neck: Neck supple.  Cardiovascular: Regular rhythm and normal heart sounds.  Tachycardia present.   No murmur heard. Pulmonary/Chest: Effort normal and breath sounds normal.  Abdominal: Soft. Bowel sounds are normal. She exhibits no distension.  There is no tenderness.  Musculoskeletal: She exhibits no edema.  Neurological:  Sleepy, keeps eyes closed, slowed speech but answers some basic questions  Skin: Skin is warm and dry.  Nursing note and vitals reviewed.   ED Course  Procedures (including critical care time) Labs Review Labs Reviewed  COMPREHENSIVE METABOLIC PANEL - Abnormal; Notable for the following:    Chloride 114 (*)    Glucose, Bld 106 (*)    BUN 32 (*)    Creatinine, Ser 1.60 (*)    Calcium 8.5 (*)    Total Protein 5.8 (*)    Albumin 2.9 (*)    GFR calc non Af Amer 32 (*)    GFR calc Af Amer 37 (*)    All other components within normal limits  CBC WITH DIFFERENTIAL/PLATELET - Abnormal; Notable for the following:    WBC 3.7 (*)    RBC 3.51 (*)    Hemoglobin 10.6 (*)    HCT 32.4 (*)    Lymphs Abs 0.5 (*)    All other components within normal limits  URINE RAPID DRUG SCREEN, HOSP PERFORMED - Abnormal; Notable for the following:    Opiates POSITIVE (*)    Benzodiazepines POSITIVE (*)    All other components within normal limits  ACETAMINOPHEN LEVEL - Abnormal; Notable for the following:    Acetaminophen (Tylenol), Serum <10 (*)    All other components within normal limits  MRSA PCR SCREENING  ETHANOL  SALICYLATE LEVEL  BASIC METABOLIC PANEL  CBC    Imaging Review No results found. I have personally reviewed and evaluated these lab results as part of my medical decision-making.   EKG Interpretation   Date/Time:  Wednesday January 25 2016 16:28:32 EDT  Ventricular Rate:  100 PR Interval:  151 QRS Duration: 80 QT Interval:  329 QTC Calculation: 424 R Axis:   -29 Text Interpretation:  Sinus tachycardia Anterior infarct, old No  significant change since last tracing Confirmed by Sylwia Cuervo MD, Joyice Magda  ([id number]) on 01/25/2016 4:38:40 PM      MDM   Final diagnoses:  Opiate overdose, intentional self-harm, initial encounter ([place])  AKI (acute kidney injury) [facility])    Patient presents by EMS after  intentional overdose of large amount of oxycodone. She was sleepy but with stable vital signs at presentation. She had slowed speech but was able to answer some basic questions. Obtained above lab work as well as EKG.EKG shows sinus tachycardia without QT prolongation. Labwork shows creatinine 1.6, UDS positive for opiates and benzodiazepines. Tylenol, salicylates, and ethanol negative. Gave IVF bolus,. I discussed the case with poison control, who recommended not starting a Narcan drip unless the patient requires several boluses of Narcan. On multiple reexaminations, the patient has been asleep but arousable by voice and has not required Narcan here. Poison control recommended 12 hour observation until return to neurologic baseline. I discussed w/ hospitalist, Dr. Shanon Brow, and pt admitted to stepdown for further care.   Sharlett Iles, MD 01/25/16 [phone]

## 2016-01-26 DIAGNOSIS — N179 Acute kidney failure, unspecified: Secondary | ICD-10-CM | POA: Diagnosis not present

## 2016-01-26 DIAGNOSIS — T1491 Suicide attempt: Secondary | ICD-10-CM | POA: Diagnosis not present

## 2016-01-26 DIAGNOSIS — R45851 Suicidal ideations: Secondary | ICD-10-CM | POA: Diagnosis not present

## 2016-01-26 DIAGNOSIS — F322 Major depressive disorder, single episode, severe without psychotic features: Secondary | ICD-10-CM

## 2016-01-26 DIAGNOSIS — T40602A Poisoning by unspecified narcotics, intentional self-harm, initial encounter: Secondary | ICD-10-CM | POA: Diagnosis not present

## 2016-01-26 DIAGNOSIS — T402X2A Poisoning by other opioids, intentional self-harm, initial encounter: Secondary | ICD-10-CM | POA: Diagnosis not present

## 2016-01-26 DIAGNOSIS — E43 Unspecified severe protein-calorie malnutrition: Secondary | ICD-10-CM | POA: Insufficient documentation

## 2016-01-26 DIAGNOSIS — T40602D Poisoning by unspecified narcotics, intentional self-harm, subsequent encounter: Secondary | ICD-10-CM | POA: Diagnosis not present

## 2016-01-26 LAB — GLUCOSE, CAPILLARY
Glucose-Capillary: 80 mg/dL (ref 65–99)
Glucose-Capillary: 83 mg/dL (ref 65–99)
Glucose-Capillary: 83 mg/dL (ref 65–99)
Glucose-Capillary: 76 mg/dL (ref 65–99)
Glucose-Capillary: 224 mg/dL — ABNORMAL HIGH (ref 65–99)
Glucose-Capillary: 86 mg/dL (ref 65–99)
Glucose-Capillary: 121 mg/dL — ABNORMAL HIGH (ref 65–99)

## 2016-01-26 LAB — CBC
HCT: 32.9 % — ABNORMAL LOW (ref 36.0–46.0)
Hemoglobin: 10.8 g/dL — ABNORMAL LOW (ref 12.0–15.0)
MCH: 30.9 pg (ref 26.0–34.0)
MCHC: 32.8 g/dL (ref 30.0–36.0)
MCV: 94 fL (ref 78.0–100.0)
Platelets: 219 10*3/uL (ref 150–400)
RBC: 3.5 MIL/uL — ABNORMAL LOW (ref 3.87–5.11)
RDW: 14.8 % (ref 11.5–15.5)
WBC: 5.1 10*3/uL (ref 4.0–10.5)

## 2016-01-26 LAB — BASIC METABOLIC PANEL
Anion gap: 11 (ref 5–15)
BUN: 22 mg/dL — ABNORMAL HIGH (ref 6–20)
CO2: 18 mmol/L — ABNORMAL LOW (ref 22–32)
Calcium: 8.4 mg/dL — ABNORMAL LOW (ref 8.9–10.3)
Chloride: 117 mmol/L — ABNORMAL HIGH (ref 101–111)
Creatinine, Ser: 1.27 mg/dL — ABNORMAL HIGH (ref 0.44–1.00)
GFR calc Af Amer: 48 mL/min — ABNORMAL LOW (ref >60)
GFR calc non Af Amer: 42 mL/min — ABNORMAL LOW (ref >60)
Glucose, Bld: 88 mg/dL (ref 65–99)
Potassium: 3.6 mmol/L (ref 3.5–5.1)
Sodium: 146 mmol/L — ABNORMAL HIGH (ref 135–145)

## 2016-01-26 MED ORDER — AMLODIPINE BESYLATE 5 MG PO TABS
5.0000 mg | ORAL_TABLET | Freq: Every day | ORAL | Status: DC
  Administered 2016-01-26 – 2016-01-28 (×3): 5 mg via ORAL
  Filled 2016-01-26 (×3): qty 1

## 2016-01-26 MED ORDER — POTASSIUM CHLORIDE CRYS ER 10 MEQ PO TBCR
10.0000 meq | EXTENDED_RELEASE_TABLET | Freq: Two times a day (BID) | ORAL | Status: DC
  Administered 2016-01-26 – 2016-02-02 (×15): 10 meq via ORAL
  Filled 2016-01-26 (×16): qty 1

## 2016-01-26 MED ORDER — PANTOPRAZOLE SODIUM 40 MG PO TBEC
40.0000 mg | DELAYED_RELEASE_TABLET | Freq: Two times a day (BID) | ORAL | Status: DC
  Administered 2016-01-26 – 2016-02-02 (×15): 40 mg via ORAL
  Filled 2016-01-26 (×19): qty 1

## 2016-01-26 MED ORDER — CETYLPYRIDINIUM CHLORIDE 0.05 % MT LIQD
7.0000 mL | Freq: Two times a day (BID) | OROMUCOSAL | Status: DC
  Administered 2016-01-26 – 2016-01-29 (×6): 7 mL via OROMUCOSAL

## 2016-01-26 MED ORDER — LABETALOL HCL 5 MG/ML IV SOLN
5.0000 mg | Freq: Once | INTRAVENOUS | Status: AC
  Administered 2016-01-26: 5 mg via INTRAVENOUS
  Filled 2016-01-26: qty 4

## 2016-01-26 MED ORDER — CLONAZEPAM 1 MG PO TABS
1.0000 mg | ORAL_TABLET | Freq: Two times a day (BID) | ORAL | Status: DC | PRN
  Administered 2016-01-26 – 2016-02-02 (×13): 1 mg via ORAL
  Filled 2016-01-26 (×13): qty 1

## 2016-01-26 MED ORDER — HYDRALAZINE HCL 20 MG/ML IJ SOLN
10.0000 mg | INTRAMUSCULAR | Status: DC | PRN
  Administered 2016-01-26 (×4): 10 mg via INTRAVENOUS
  Filled 2016-01-26 (×4): qty 1

## 2016-01-26 MED ORDER — CHLORHEXIDINE GLUCONATE 0.12 % MT SOLN
15.0000 mL | Freq: Two times a day (BID) | OROMUCOSAL | Status: DC
  Administered 2016-01-26 – 2016-01-30 (×9): 15 mL via OROMUCOSAL
  Filled 2016-01-26 (×16): qty 15

## 2016-01-26 MED ORDER — PROMETHAZINE HCL 25 MG PO TABS: 25.0000 mg | ORAL_TABLET | Freq: Four times a day (QID) | ORAL | Status: DC | PRN

## 2016-01-26 MED ORDER — ASPIRIN 81 MG PO CHEW
81.0000 mg | CHEWABLE_TABLET | Freq: Every day | ORAL | Status: DC
  Administered 2016-01-26 – 2016-02-02 (×8): 81 mg via ORAL
  Filled 2016-01-26 (×8): qty 1

## 2016-01-26 MED ORDER — VITAMINS A & D EX OINT
TOPICAL_OINTMENT | CUTANEOUS | Status: AC
  Administered 2016-01-26: 05:00:00
  Filled 2016-01-26: qty 5

## 2016-01-26 MED ORDER — ACETAMINOPHEN 325 MG PO TABS
650.0000 mg | ORAL_TABLET | Freq: Four times a day (QID) | ORAL | Status: DC | PRN
  Administered 2016-01-26 – 2016-02-02 (×14): 650 mg via ORAL
  Filled 2016-01-26 (×14): qty 2

## 2016-01-26 MED ORDER — HYDRALAZINE HCL 20 MG/ML IJ SOLN
5.0000 mg | Freq: Once | INTRAMUSCULAR | Status: AC
  Administered 2016-01-26: 5 mg via INTRAVENOUS
  Filled 2016-01-26: qty 1

## 2016-01-26 MED ORDER — PREDNISONE 5 MG PO TABS
5.0000 mg | ORAL_TABLET | ORAL | Status: DC
  Administered 2016-01-26 – 2016-02-01 (×4): 5 mg via ORAL
  Filled 2016-01-26 (×4): qty 1

## 2016-01-26 NOTE — Progress Notes (Signed)
BP elevated this AM 160-180/70s. PRN dose hydralazine given. Noted that pt takes Norvasc daily on home med list. Dr Algis Liming made aware. Will monitor pt and for new orders.

## 2016-01-26 NOTE — Progress Notes (Signed)
PROGRESS NOTE    [name]  TDV:[id number]  DOB: [birth year]  DOA: 01/25/2016 PCP: Thressa Sheller, MD Outpatient Specialists:   Hospital course: 70 year old female patient with history of asthma/COPD, HTN, GERD, CAD, DM, anxiety, depression, chronic pain, was found by her daughter on 01/25/16 after patient had overdosed on her opioid pain pills and was trying to write a suicide note. It was suspected that she took about 50 of her oxycodone pills. Patient indicated that she received a phone call from her 58 office indicating that they were no longer going to treat her pain and that her roommate had been taking her pain pills which may have led her treating physician to believe that she inappropriately took them. She got upset about this and attempted to kill herself by drug overdose. She received a dose of Narcan via EMS prior to arrival to the ED. She was admitted to stepdown for further management. Mental status changes have resolved. Psychiatric consulted. Suicide precautions in place.   Assessment & Plan:   Opioid overdose - Done in a suicide attempt on 3/29 afternoon. - Received a dose of Narcan via EMS prior to arrival to the ED. Has not received any Narcan since. - Mental status changes have resolved. Hemodynamically stable.  Suicide attempt - Continue suicide precautions and 1:1 sitter. Psychiatry was consulted on 01/26/16. Patient has IVC done by ED.  Essential hypertension - Uncontrolled. Patient has not started her home medications. Resume home medications after reconciliation by pharmacy.  Asthma/COPD - Stable  DM 2 - Controlled.  Acute kidney injury - Possibly from volume depletion. Improved. As per RN, patient has been drinking lots of water since admission. Continue to encourage him. Follow BMP in a.m. - Creatinine was 1.01 on 9/30. Admitted with creatinine of 1.6. Mild hypernatremia suggests volume depletion.  Chronic pain/lumbar spinal  stenosis/fibromyalgia - Judicious use of pain medications given history of opioid overdose.  Anemia - Stable.    DVT prophylaxis: Lovenox Code Status: Full Family Communication: None at bedside Disposition Plan: Admitted to stepdown unit. Transfer to medical floor 3/30. Discharge disposition to be determined depending psychiatry input.   Consultants:  Psychiatry  Procedures:  None  Antimicrobials:  None   Subjective: Patient asking for something to drink. Chronic low back pain. No suicidal ideations. As per RN, no acute issues.  Objective: Filed Vitals:   01/26/16 0800 01/26/16 0900 01/26/16 1000 01/26/16 1100  BP: 172/62 182/76 161/69 169/73  Pulse: 93 87 93 98  Temp:      TempSrc:      Resp: '24 24 24 18  '$ Height:      Weight:      SpO2: 97% 99% 97% 99%  Temperature 98.21F.  Intake/Output Summary (Last 24 hours) at 01/26/16 1131 Last data filed at 01/26/16 1100  Gross per 24 hour  Intake 1542.5 ml  Output      0 ml  Net 1542.5 ml   Filed Weights   01/25/16 2112 01/26/16 0355  Weight: 49.4 kg (108 lb 14.5 oz) 49.7 kg (109 lb 9.1 oz)    Exam:  General exam: Moderately built and thinly nourished pleasant elderly female lying comfortably supine in bed. Respiratory system: Clear. No increased work of breathing. Cardiovascular system: S1 & S2 heard, RRR. No JVD, murmurs, gallops, clicks or pedal edema. Telemetry: Sinus rhythm. Gastrointestinal system: Abdomen is nondistended, soft and nontender. Normal bowel sounds heard. Central nervous system: Alert and oriented. No focal neurological deficits. Extremities: Symmetric 5 x 5 power.  Psychiatric: Pleasant and appropriate. Denies suicidal ideations at this time.    Data Reviewed: Basic Metabolic Panel:  Recent Labs Lab 01/25/16 1700 01/26/16 0315  NA 145 146*  K 4.1 3.6  CL 114* 117*  CO2 22 18*  GLUCOSE 106* 88  BUN 32* 22*  CREATININE 1.60* 1.27*  CALCIUM 8.5* 8.4*   Liver Function  Tests:  Recent Labs Lab 01/25/16 1700  AST 16  ALT 19  ALKPHOS 95  BILITOT 0.3  PROT 5.8*  ALBUMIN 2.9*   No results for input(s): LIPASE, AMYLASE in the last 168 hours. No results for input(s): AMMONIA in the last 168 hours. CBC:  Recent Labs Lab 01/25/16 1700 01/26/16 0315  WBC 3.7* 5.1  NEUTROABS 2.9  --   HGB 10.6* 10.8*  HCT 32.4* 32.9*  MCV 92.3 94.0  PLT 199 219   Cardiac Enzymes: No results for input(s): CKTOTAL, CKMB, CKMBINDEX, TROPONINI in the last 168 hours. BNP (last 3 results) No results for input(s): PROBNP in the last 8760 hours. CBG:  Recent Labs Lab 01/25/16 2138 01/26/16 0039 01/26/16 0358 01/26/16 0729  GLUCAP 80 83 83 76    Recent Results (from the past 240 hour(s))  MRSA PCR Screening     Status: None   Collection Time: 01/25/16  8:23 PM  Result Value Ref Range Status   MRSA by PCR NEGATIVE NEGATIVE Final    Comment:        The GeneXpert MRSA Assay (FDA approved for NASAL specimens only), is one component of a comprehensive MRSA colonization surveillance program. It is not intended to diagnose MRSA infection nor to guide or monitor treatment for MRSA infections.          Studies: No results found.      Scheduled Meds: . antiseptic oral rinse  7 mL Mouth Rinse q12n4p  . chlorhexidine  15 mL Mouth Rinse BID  . enoxaparin (LOVENOX) injection  30 mg Subcutaneous Q24H  . insulin aspart  0-9 Units Subcutaneous Q4H   Continuous Infusions:    Principal Problem:   Suicide attempt [facility]) Active Problems:   Spinal stenosis of lumbar region   Diabetes mellitus ([place])   COPD (chronic obstructive pulmonary disease) ([facility])   Diabetes mellitus with neurological manifestation ([facility])   AKI (acute kidney injury) ([place])   Opiate overdose    Time spent: 30 minutes.    Vernell Leep, MD, FACP, FHM. [facility] Pager [phone]  If 7PM-7AM, please contact night-coverage [email or web address] Password [facility] 01/26/2016,  11:31 AM

## 2016-01-26 NOTE — Progress Notes (Signed)
Report called to Shanon Brow, RN, on 5E.  Pt aware of transfer to 1512 and agreeable.

## 2016-01-26 NOTE — Progress Notes (Signed)
Initial Nutrition Assessment  DOCUMENTATION CODES:   Severe malnutrition in context of acute illness/injury  INTERVENTION:   - Provide Boost Breeze TID, each supplement provides 250 kcals and 10 grams of protein. - Continue to encourage good PO intake of meals and supplements. - Will continue to monitor for nutritional needs.  NUTRITION DIAGNOSIS:   Inadequate oral intake related to poor appetite as evidenced by per patient/family report.  GOAL:   Patient will meet greater than or equal to 90% of their needs  MONITOR:   PO intake, Supplement acceptance, Labs, Weight trends, I & O's  REASON FOR ASSESSMENT:   Malnutrition Screening Tool    ASSESSMENT:   70 yo female h/o chronic back pain, copd, CAD, CKD, DM amoungst others found by her daughter today after overdosing on her pain pills and trying to write a suicide note. Suspected pt took about 50 of her oxycondone pills. Pt says she took "handfulls". She says she got a phone call yesterday from her pain doctor office who told here they were no longer going to treat her pain. Pt says they gave no reason. On further questioning, she reveals that her roommate has been taking her pain pills and so her pain doctor thinks she is inappropriately using them. Pt got very upset about this. And says she tried to kill herself because "i am tired of being in pain all the time, im miserable". Pt is up and able to hold a conversation at this time. She was given narcan '1mg'$  with EMS per nasal and has not had any further narcan since. Pt referred for admission for opiate overdose with suicidal intent.   Patient reports that she has had a poor appetite and has not been eating more than snacks throughout the day PTA.  States that the pain on her left side of her body makes it hard to prepare meals.  Since admission, patient states that she has been very thirsty.  Pt did not order breakfast because she was only thirsty.  Reports that she drinks a  lot of juice and does not like water.  Patient was amenable to trying [facility] and one was provided one at time of visit.  Stated that she had not ordered lunch because she couldn't read the menu without her glasses.  NT and Dietetic Intern reviewed menu with pt then called in her order, roasted pork loin, mashed potatoes, collard greens, [place] [employer].    Nutrition Focused Physical Exam was conducted.  Findings include moderate fat depletion, moderate muscle depletion, and no edema.  Patient reports her usual body weight is 125# but more recently weighed 104# at her last doctors visit.  Per chart review, patients weight has been fairlystable between 98-109# over the past year.   Patient is not likely meeting her needs PTA or currently.  Dietetic Intern will order Boost Breeze TID.  Continue to encourage good PO intake of meals and supplements.  Medications reviewed: novolog.  Labs reviewed: CBGs (76-83), sodium elevated (146).   Diet Order:  Diet regular Room service appropriate?: Yes; Fluid consistency:: Thin  Skin:  Reviewed, no issues  Last BM:  3/29  Height:   Ht Readings from Last 1 Encounters:  01/25/16 '5\' 4"'$  (1.626 m)    Weight:   Wt Readings from Last 1 Encounters:  01/26/16 109 lb 9.1 oz (49.7 kg)    Ideal Body Weight:  54.5 kg  BMI:  Body mass index is 18.8 kg/(m^2).  Estimated Nutritional Needs:  Kcal:  1400-1600  Protein:  55-65 grams  Fluid:  >/= 1.5 L  EDUCATION NEEDS:   No education needs identified at this time  [name], Dietetic Intern Pager: [phone]

## 2016-01-26 NOTE — Care Management Obs Status (Signed)
MEDICARE OBSERVATION STATUS NOTIFICATION   Patient Details  Name: [name] MRN: [id number] Date of Birth: Jun 12, 1946   Medicare Observation Status Notification Given:  Yes    Guadalupe Maple, RN 01/26/2016, 3:50 PM

## 2016-01-26 NOTE — Consult Note (Signed)
[facility] Consult   Reason for Consult:  Intentional suicide overdose Referring Physician:  Dr. Algis Liming Patient Identification: [name] MRN:  [id number] Principal Diagnosis: Suicide attempt [facility]) Diagnosis:   Patient Active Problem List   Diagnosis Date Noted  . Opiate overdose [T40.601A] 01/25/2016  . Suicide attempt ([place]) [T14.91] 01/25/2016  . Depression [F32.9]   . Dyslipidemia [E78.5]   . Gastroesophageal reflux disease without esophagitis [K21.9]   . UTI (lower urinary tract infection) [N39.0] 07/29/2015  . COPD (chronic obstructive pulmonary disease) ([place]) [J44.9] 07/29/2015  . Diabetes mellitus with neurological manifestation ([place]) [E11.49] 07/29/2015  . Frequent falls [R29.6] 07/29/2015  . HTN (hypertension) [I10] 07/29/2015  . AKI (acute kidney injury) ([place]) [N17.9] 07/29/2015  . Sepsis ([place]) [A41.9] 07/29/2015  . Aftercare following surgery of the circulatory system, [place] [Z48.812] 11/25/2013  . Spinal stenosis of lumbar region [M48.06] 04/23/2013  . Diabetes mellitus ([place]) [E11.9] 04/23/2013  . Tremor due to multiple drugs [G25.1] 04/23/2013  . Tobacco use disorder [F17.200] 04/23/2013  . COPD exacerbation ([place]) [J44.1] 04/23/2013  . Occlusion and stenosis of carotid artery without mention of cerebral infarction [I65.29] 03/12/2012  . Viral bronchitis-possible h. infl vs Norovirus [J20.8] 11/21/2011    Total Time spent with patient: 1 hour  Subjective:   [name] is a 70 y.o. female patient admitted with intentional suicide overdose.  HPI:  [name] is a 70 years old female admitted to [facility] for status post intentional suicide by taking prescription medication overdose. Patient seen, chart reviewed for the face-to-face psychiatric consultation and evaluation of increased symptoms of depression and status post suicidal attempt. Patient reported she has been suffering with chronic back pain and having hard time  to obtain pain management provider. Patient initially referred to the other pain clinic but later willing to continue pain management at [facility] by Dr. Mee Hives. Patient stated she got a message from the office stating that she won't be able to maintain in the same [facility]. Patient is reluctant to provide more details. Patient is also under significant stress due to financial difficulties, she has a 3 nonfamily members living with her with a limited to no financial income. Patient has a daughter and older sister who is supportive to her but patient does not ask because she is too proud to ask. Patient stated that she has been hurting too long and all over and she has intention to end her life when she took the pills especially oxycodone, reportedly handfuls and also trying to write a suicide note. Patient endorses severe headache, depression, anxiety disturbance of sleep and appetite, hopeless and helpless with her situation. Patient does not contract for safety at this time and required acute inpatient psychiatric hospitalization at geriatric psychiatry when medically stable.  Past Psychiatric History: none reported  Risk to Self: Is patient at risk for suicide?: Yes Risk to Others:   Prior Inpatient Therapy:   Prior Outpatient Therapy:    Past Medical History:  Past Medical History  Diagnosis Date  . Asthma   . HTN (hypertension)   . Grave's disease   . Fibromyalgia   . Thrombophlebitis   . Depression   . Angina   . Shortness of breath   . COPD (chronic obstructive pulmonary disease) ([place])   . Sleep apnea   . Blood dyscrasia     hx of thrombphlebitis  . Recurrent upper respiratory infection (URI)   . GERD (gastroesophageal reflux disease)   .  Headache(784.0)   . Arthritis   . CAD (coronary artery disease)   . Chronic kidney disease   . Peripheral vascular disease ([place])   . Diabetes mellitus   . Ulcer   . Diarrhea     chronic   . Thyroid disorder   . Anxiety  and depression   . Chronic pain     leg and feet  . DDD (degenerative disc disease)   . Rhinitis   . OP (osteoporosis)   . Vitamin B 12 deficiency   . PUD (peptic ulcer disease)   . DDD (degenerative disc disease)   . Spinal stenosis   . Spinal stenosis of lumbar region 04/23/2013  . Tobacco use disorder 04/23/2013  . DVT (deep venous thrombosis) ([place])   . Carotid artery occlusion   . Parkinson's disease ([place])   . Dry mouth     Past Surgical History  Procedure Laterality Date  . Gastrectomy      age 55   Part of small intestin and part of stomach  . Cholecystectomy  1999  . Carotid endarterectomy Left Sept. 20,2011    cea  . Left heart catheterization with coronary angiogram N/A 10/02/2011    Procedure: LEFT HEART CATHETERIZATION WITH CORONARY ANGIOGRAM;  Surgeon: Sueanne Margarita, MD;  Location: [facility] CATH LAB;  Service: Cardiovascular;  Laterality: N/A;   Family History:  Family History  Problem Relation Age of Onset  . Diabetes Mother   . Hyperlipidemia Mother   . Heart attack Mother   . Other Mother     varicose veins,respiratory,stroke  . Heart disease Mother     before age 89  . Hypertension Mother   . Varicose Veins Mother   . Diabetes Father   . Heart disease Father     before age 77  . Hyperlipidemia Father   . Heart attack Father   . Other Father     varicose veins  . Hypertension Father   . Varicose Veins Father   . Diabetes Sister   . Heart disease Sister     before age 76  . Hyperlipidemia Sister   . Heart attack Sister   . Other Sister     varicose veins  . Hypertension Sister   . Varicose Veins Sister   . Peripheral vascular disease Sister   . Diabetes Sister   . Hyperlipidemia Sister   . Hypertension Sister   . Varicose Veins Sister    Family Psychiatric  History: Unknown  Social History:  History  Alcohol Use No     History  Drug Use No    Social History   Social History  . Marital Status: Widowed    Spouse Name: N/A  . Number of  Children: 1  . Years of Education: N/A   Social History Main Topics  . Smoking status: Current Every Day Smoker -- 2.00 packs/day for 50 years    Types: Cigarettes  . Smokeless tobacco: Never Used     Comment: cessation info given and reviewed  . Alcohol Use: No  . Drug Use: No  . Sexual Activity: No   Other Topics Concern  . None   Social History Narrative   Additional Social History:    Allergies:   Allergies  Allergen Reactions  . Clarithromycin Nausea And Vomiting and Nausea Only  . Bee Venom     Labs:  Results for orders placed or performed during the hospital encounter of 01/25/16 (from the past 48 hour(s))  Comprehensive metabolic  panel     Status: Abnormal   Collection Time: 01/25/16  5:00 PM  Result Value Ref Range   Sodium 145 135 - 145 mmol/L   Potassium 4.1 3.5 - 5.1 mmol/L   Chloride 114 (H) 101 - 111 mmol/L   CO2 22 22 - 32 mmol/L   Glucose, Bld 106 (H) 65 - 99 mg/dL   BUN 32 (H) 6 - 20 mg/dL   Creatinine, Ser 1.60 (H) 0.44 - 1.00 mg/dL   Calcium 8.5 (L) 8.9 - 10.3 mg/dL   Total Protein 5.8 (L) 6.5 - 8.1 g/dL   Albumin 2.9 (L) 3.5 - 5.0 g/dL   AST 16 15 - 41 U/L   ALT 19 14 - 54 U/L   Alkaline Phosphatase 95 38 - 126 U/L   Total Bilirubin 0.3 0.3 - 1.2 mg/dL   GFR calc non Af Amer 32 (L) >60 mL/min   GFR calc Af Amer 37 (L) >60 mL/min    Comment: (NOTE) The eGFR has been calculated using the CKD EPI equation. This calculation has not been validated in all clinical situations. eGFR's persistently <60 mL/min signify possible Chronic Kidney Disease.    Anion gap 9 5 - 15  Ethanol     Status: None   Collection Time: 01/25/16  5:00 PM  Result Value Ref Range   Alcohol, Ethyl (B) <5 <5 mg/dL    Comment:        LOWEST DETECTABLE LIMIT FOR SERUM ALCOHOL IS 5 mg/dL FOR MEDICAL PURPOSES ONLY   CBC with Diff     Status: Abnormal   Collection Time: 01/25/16  5:00 PM  Result Value Ref Range   WBC 3.7 (L) 4.0 - 10.5 K/uL   RBC 3.51 (L) 3.87 - 5.11  MIL/uL   Hemoglobin 10.6 (L) 12.0 - 15.0 g/dL   HCT 32.4 (L) 36.0 - 46.0 %   MCV 92.3 78.0 - 100.0 fL   MCH 30.2 26.0 - 34.0 pg   MCHC 32.7 30.0 - 36.0 g/dL   RDW 14.6 11.5 - 15.5 %   Platelets 199 150 - 400 K/uL   Neutrophils Relative % 77 %   Neutro Abs 2.9 1.7 - 7.7 K/uL   Lymphocytes Relative 12 %   Lymphs Abs 0.5 (L) 0.7 - 4.0 K/uL   Monocytes Relative 11 %   Monocytes Absolute 0.4 0.1 - 1.0 K/uL   Eosinophils Relative 0 %   Eosinophils Absolute 0.0 0.0 - 0.7 K/uL   Basophils Relative 0 %   Basophils Absolute 0.0 0.0 - 0.1 K/uL  Salicylate level     Status: None   Collection Time: 01/25/16  5:00 PM  Result Value Ref Range   Salicylate Lvl <8.8 2.8 - 30.0 mg/dL  Acetaminophen level     Status: Abnormal   Collection Time: 01/25/16  5:00 PM  Result Value Ref Range   Acetaminophen (Tylenol), Serum <10 (L) 10 - 30 ug/mL    Comment:        THERAPEUTIC CONCENTRATIONS VARY SIGNIFICANTLY. A RANGE OF 10-30 ug/mL MAY BE AN EFFECTIVE CONCENTRATION FOR MANY PATIENTS. HOWEVER, SOME ARE BEST TREATED AT CONCENTRATIONS OUTSIDE THIS RANGE. ACETAMINOPHEN CONCENTRATIONS >150 ug/mL AT 4 HOURS AFTER INGESTION AND >50 ug/mL AT 12 HOURS AFTER INGESTION ARE OFTEN ASSOCIATED WITH TOXIC REACTIONS.   Urine rapid drug screen (hosp performed)not at [facility]     Status: Abnormal   Collection Time: 01/25/16  6:12 PM  Result Value Ref Range   Opiates POSITIVE (A) NONE DETECTED  Cocaine NONE DETECTED NONE DETECTED   Benzodiazepines POSITIVE (A) NONE DETECTED   Amphetamines NONE DETECTED NONE DETECTED   Tetrahydrocannabinol NONE DETECTED NONE DETECTED   Barbiturates NONE DETECTED NONE DETECTED    Comment:        DRUG SCREEN FOR MEDICAL PURPOSES ONLY.  IF CONFIRMATION IS NEEDED FOR ANY PURPOSE, NOTIFY LAB WITHIN 5 DAYS.        LOWEST DETECTABLE LIMITS FOR URINE DRUG SCREEN Drug Class       Cutoff (ng/mL) Amphetamine      1000 Barbiturate      200 Benzodiazepine   937 Tricyclics        902 Opiates          300 Cocaine          300 THC              50   MRSA PCR Screening     Status: None   Collection Time: 01/25/16  8:23 PM  Result Value Ref Range   MRSA by PCR NEGATIVE NEGATIVE    Comment:        The GeneXpert MRSA Assay (FDA approved for NASAL specimens only), is one component of a comprehensive MRSA colonization surveillance program. It is not intended to diagnose MRSA infection nor to guide or monitor treatment for MRSA infections.   Glucose, capillary     Status: None   Collection Time: 01/25/16  9:38 PM  Result Value Ref Range   Glucose-Capillary 80 65 - 99 mg/dL  Glucose, capillary     Status: None   Collection Time: 01/26/16 12:39 AM  Result Value Ref Range   Glucose-Capillary 83 65 - 99 mg/dL  Basic metabolic panel     Status: Abnormal   Collection Time: 01/26/16  3:15 AM  Result Value Ref Range   Sodium 146 (H) 135 - 145 mmol/L   Potassium 3.6 3.5 - 5.1 mmol/L   Chloride 117 (H) 101 - 111 mmol/L   CO2 18 (L) 22 - 32 mmol/L   Glucose, Bld 88 65 - 99 mg/dL   BUN 22 (H) 6 - 20 mg/dL   Creatinine, Ser 1.27 (H) 0.44 - 1.00 mg/dL   Calcium 8.4 (L) 8.9 - 10.3 mg/dL   GFR calc non Af Amer 42 (L) >60 mL/min   GFR calc Af Amer 48 (L) >60 mL/min    Comment: (NOTE) The eGFR has been calculated using the CKD EPI equation. This calculation has not been validated in all clinical situations. eGFR's persistently <60 mL/min signify possible Chronic Kidney Disease.    Anion gap 11 5 - 15  CBC     Status: Abnormal   Collection Time: 01/26/16  3:15 AM  Result Value Ref Range   WBC 5.1 4.0 - 10.5 K/uL   RBC 3.50 (L) 3.87 - 5.11 MIL/uL   Hemoglobin 10.8 (L) 12.0 - 15.0 g/dL   HCT 32.9 (L) 36.0 - 46.0 %   MCV 94.0 78.0 - 100.0 fL   MCH 30.9 26.0 - 34.0 pg   MCHC 32.8 30.0 - 36.0 g/dL   RDW 14.8 11.5 - 15.5 %   Platelets 219 150 - 400 K/uL  Glucose, capillary     Status: None   Collection Time: 01/26/16  3:58 AM  Result Value Ref Range    Glucose-Capillary 83 65 - 99 mg/dL  Glucose, capillary     Status: None   Collection Time: 01/26/16  7:29 AM  Result Value Ref Range  Glucose-Capillary 76 65 - 99 mg/dL    Current Facility-Administered Medications  Medication Dose Route Frequency Provider Last Rate Last Dose  . antiseptic oral rinse (CPC / CETYLPYRIDINIUM CHLORIDE 0.05%) solution 7 mL  7 mL Mouth Rinse q12n4p [name], MD      . chlorhexidine (PERIDEX) 0.12 % solution 15 mL  15 mL Mouth Rinse BID [name], MD   15 mL at 01/26/16 1010  . enoxaparin (LOVENOX) injection 30 mg  30 mg Subcutaneous Q24H [name], MD   30 mg at 01/25/16 2217  . hydrALAZINE (APRESOLINE) injection 10 mg  10 mg Intravenous Q4H PRN Gardiner Barefoot, NP   10 mg at 01/26/16 7106  . insulin aspart (novoLOG) injection 0-9 Units  0-9 Units Subcutaneous Q4H Gardiner Barefoot, NP   0 Units at 01/25/16 2130  . naloxone [facility]) injection 0.4 mg  0.4 mg Intravenous PRN [name], MD      . ondansetron [facility]) tablet 4 mg  4 mg Oral Q6H PRN [name], MD       Or  . ondansetron (ZOFRAN) injection 4 mg  4 mg Intravenous Q6H PRN [name], MD        Musculoskeletal: Strength & Muscle Tone: decreased Gait & Station: unable to stand Patient leans: N/A  Psychiatric Specialty Exam: ROS complaining about her depression, suicidal intention and attempt and chronic back pain questionable inappropriate use of opiates.  No Fever-chills, No Headache, No changes with Vision or hearing, reports vertigo No problems swallowing food or Liquids, No Chest pain, Cough or Shortness of Breath, No Abdominal pain, No Nausea or Vommitting, Bowel movements are regular, No Blood in stool or Urine, No dysuria, No new skin rashes or bruises, No new joints pains-aches,  No new weakness, tingling, numbness in any extremity, No recent weight gain or loss, No polyuria, polydypsia or polyphagia,  A full 10 point Review of Systems was  done, except as stated above, all other Review of Systems were negative.  Blood pressure 169/73, pulse 98, temperature 99.4 F (37.4 C), temperature source Oral, resp. rate 18, height 5' 4" (1.626 m), weight 49.7 kg (109 lb 9.1 oz), SpO2 99 %.Body mass index is 18.8 kg/(m^2).  General Appearance: Guarded  Eye Contact::  Good  Speech:  Clear and Coherent  Volume:  Decreased  Mood:  Anxious, Depressed and Irritable  Affect:  Constricted and Depressed  Thought Process:  Coherent and Goal Directed  Orientation:  Full (Time, Place, and Person)  Thought Content:  WDL  Suicidal Thoughts:  Yes.  with intent/plan  Homicidal Thoughts:  No  Memory:  Immediate;   Good Recent;   Fair Remote;   Fair  Judgement:  Impaired  Insight:  Fair  Psychomotor Activity:  Decreased  Concentration:  Good  Recall:  Good  Fund of Knowledge:Good  Language: Good  Akathisia:  Negative  Handed:  Right  AIMS (if indicated):     Assets:  Communication Skills Desire for Improvement Housing Leisure Time Resilience Social Support Transportation  ADL's:  Impaired  Cognition: WNL  Sleep:      Treatment Plan Summary: Daily contact with patient to assess and evaluate symptoms and progress in treatment and Medication management  Monitor for opiate and benzodiazepine withdrawal symptoms We start Cymbalta 30 mg daily for depression and continue clonazepam 1 mg twice daily as needed for anxiety Will provide Tylenol 650 mg every 6 hours as needed for headache Refer to the  psychiatric social service for geriatric psychiatric placement when medically stable  Disposition: Recommend psychiatric Inpatient admission when medically cleared. Supportive therapy provided about ongoing stressors.  Durward Parcel., MD 01/26/2016 11:15 AM

## 2016-01-27 DIAGNOSIS — N179 Acute kidney failure, unspecified: Secondary | ICD-10-CM | POA: Diagnosis not present

## 2016-01-27 DIAGNOSIS — T402X2A Poisoning by other opioids, intentional self-harm, initial encounter: Secondary | ICD-10-CM | POA: Diagnosis not present

## 2016-01-27 DIAGNOSIS — T1491 Suicide attempt: Secondary | ICD-10-CM | POA: Diagnosis not present

## 2016-01-27 DIAGNOSIS — T40602D Poisoning by unspecified narcotics, intentional self-harm, subsequent encounter: Secondary | ICD-10-CM | POA: Diagnosis not present

## 2016-01-27 LAB — BASIC METABOLIC PANEL
Anion gap: 8 (ref 5–15)
BUN: 16 mg/dL (ref 6–20)
CO2: 18 mmol/L — ABNORMAL LOW (ref 22–32)
Calcium: 8.2 mg/dL — ABNORMAL LOW (ref 8.9–10.3)
Chloride: 113 mmol/L — ABNORMAL HIGH (ref 101–111)
Creatinine, Ser: 1.07 mg/dL — ABNORMAL HIGH (ref 0.44–1.00)
GFR calc Af Amer: 60 mL/min — ABNORMAL LOW (ref >60)
GFR calc non Af Amer: 51 mL/min — ABNORMAL LOW (ref >60)
Glucose, Bld: 98 mg/dL (ref 65–99)
Potassium: 4.2 mmol/L (ref 3.5–5.1)
Sodium: 139 mmol/L (ref 135–145)

## 2016-01-27 LAB — GLUCOSE, CAPILLARY
Glucose-Capillary: 91 mg/dL (ref 65–99)
Glucose-Capillary: 81 mg/dL (ref 65–99)
Glucose-Capillary: 120 mg/dL — ABNORMAL HIGH (ref 65–99)

## 2016-01-27 LAB — CBC
HCT: 32.2 % — ABNORMAL LOW (ref 36.0–46.0)
Hemoglobin: 10.8 g/dL — ABNORMAL LOW (ref 12.0–15.0)
MCH: 30.9 pg (ref 26.0–34.0)
MCHC: 33.5 g/dL (ref 30.0–36.0)
MCV: 92 fL (ref 78.0–100.0)
Platelets: 268 10*3/uL (ref 150–400)
RBC: 3.5 MIL/uL — ABNORMAL LOW (ref 3.87–5.11)
RDW: 14.5 % (ref 11.5–15.5)
WBC: 5.2 10*3/uL (ref 4.0–10.5)

## 2016-01-27 MED ORDER — ZOLPIDEM TARTRATE 5 MG PO TABS
5.0000 mg | ORAL_TABLET | Freq: Once | ORAL | Status: AC
  Administered 2016-01-28: 5 mg via ORAL
  Filled 2016-01-27: qty 1

## 2016-01-27 NOTE — Progress Notes (Signed)
PROGRESS NOTE    [name]  QMV:[id number]  DOB: [birth year]  DOA: 01/25/2016 PCP: Thressa Sheller, MD Outpatient Specialists:   Hospital course: 70 year old female patient with history of asthma/COPD, HTN, GERD, CAD, DM, anxiety, depression, chronic pain, was found by her daughter on 01/25/16 after patient had overdosed on her opioid pain pills and was trying to write a suicide note. It was suspected that she took about 50 of her oxycodone pills. Patient indicated that she received a phone call from her 41 office indicating that they were no longer going to treat her pain and that her roommate had been taking her pain pills which may have led her treating physician to believe that she inappropriately took them. She got upset about this and attempted to kill herself by drug overdose. She received a dose of Narcan via EMS prior to arrival to the ED. She was admitted to stepdown for further management. Mental status changes have resolved. Psychiatric consulted. Suicide precautions in place. Medically stable for discharge to [facility] when bed available.   Assessment & Plan:   Opioid overdose - Done in a suicide attempt on 3/29 afternoon. - Received a dose of Narcan via EMS prior to arrival to the ED. Has not received any Narcan since. - Mental status changes have resolved. Hemodynamically stable.  Suicide attempt/anxiety & depression - Continue suicide precautions and 1:1 sitter. Psychiatry was consulted on 01/26/16. Patient has IVC done by ED. - Psychiatry consultation 3/30 appreciated. There are recommend inpatient admission when medically cleared. Patient is medically stable at this time for discharge to inpatient psychiatry when beds available. - Psychiatry has started Cymbalta 30 mg daily for depression and continue clonazepam 1 MG twice a day as needed for anxiety.  Essential hypertension - Mildly uncontrolled. Continue amlodipine.  Asthma/COPD - Stable  DM 2 -  Controlled.  Acute kidney injury - Possibly from volume depletion. Improved. As per RN, patient has been drinking lots of water since admission. Continue to encourage him. Follow BMP in a.m. - Creatinine was 1.01 on 9/30. Admitted with creatinine of 1.6. Mild hypernatremia suggests volume depletion. - Acute kidney injury and hypernatremia have resolved. Bicarbonate to little low at 18. Follow BMP in a.m. but should not preclude discharge to [facility].  Chronic pain/lumbar spinal stenosis/fibromyalgia - Judicious use of pain medications given history of opioid overdose.  Anemia - Stable.    DVT prophylaxis: Lovenox Code Status: Full Family Communication: None at bedside Disposition Plan: Admitted to stepdown unit. Transferred to medical floor 3/30. Discharge to inpatient psychiatry when bed available. Patient medically stable for discharge.   Consultants:  Psychiatry  Procedures:  None  Antimicrobials:  None   Subjective: Seen this morning. Patient denied complaints. No pain reported.   Objective: Filed Vitals:   01/26/16 1705 01/26/16 2127 01/27/16 0537 01/27/16 1300  BP: 157/65 148/68 150/77 144/60  Pulse: 92 96 93 89  Temp: 98.7 F (37.1 C) 98.5 F (36.9 C) 98.2 F (36.8 C) 97.5 F (36.4 C)  TempSrc: Oral Oral Oral Oral  Resp: '18 17 17 18  '$ Height:      Weight:      SpO2: 98% 97% 97% 99%    Intake/Output Summary (Last 24 hours) at 01/27/16 1832 Last data filed at 01/27/16 1019  Gross per 24 hour  Intake    480 ml  Output      0 ml  Net    480 ml   Filed Weights   01/25/16 2112 01/26/16 0355  Weight: 49.4 kg (108 lb 14.5 oz) 49.7 kg (109 lb 9.1 oz)    Exam:  General exam: Moderately built and thinly nourished pleasant elderly female lying comfortably supine in bed. Respiratory system: Clear. No increased work of breathing. Cardiovascular system: S1 & S2 heard, RRR. No JVD, murmurs, gallops, clicks or pedal edema.  Gastrointestinal system: Abdomen is  nondistended, soft and nontender. Normal bowel sounds heard. Central nervous system: Alert and oriented. No focal neurological deficits. Extremities: Symmetric 5 x 5 power.  Psychiatric: Pleasant and appropriate. Denies suicidal ideations at this time.    Data Reviewed: Basic Metabolic Panel:  Recent Labs Lab 01/25/16 1700 01/26/16 0315 01/27/16 0520  NA 145 146* 139  K 4.1 3.6 4.2  CL 114* 117* 113*  CO2 22 18* 18*  GLUCOSE 106* 88 98  BUN 32* 22* 16  CREATININE 1.60* 1.27* 1.07*  CALCIUM 8.5* 8.4* 8.2*   Liver Function Tests:  Recent Labs Lab 01/25/16 1700  AST 16  ALT 19  ALKPHOS 95  BILITOT 0.3  PROT 5.8*  ALBUMIN 2.9*   No results for input(s): LIPASE, AMYLASE in the last 168 hours. No results for input(s): AMMONIA in the last 168 hours. CBC:  Recent Labs Lab 01/25/16 1700 01/26/16 0315 01/27/16 0520  WBC 3.7* 5.1 5.2  NEUTROABS 2.9  --   --   HGB 10.6* 10.8* 10.8*  HCT 32.4* 32.9* 32.2*  MCV 92.3 94.0 92.0  PLT 199 219 268   Cardiac Enzymes: No results for input(s): CKTOTAL, CKMB, CKMBINDEX, TROPONINI in the last 168 hours. BNP (last 3 results) No results for input(s): PROBNP in the last 8760 hours. CBG:  Recent Labs Lab 01/26/16 1603 01/26/16 2125 01/27/16 0729 01/27/16 1200 01/27/16 1646  GLUCAP 86 121* 91 81 120*    Recent Results (from the past 240 hour(s))  MRSA PCR Screening     Status: None   Collection Time: 01/25/16  8:23 PM  Result Value Ref Range Status   MRSA by PCR NEGATIVE NEGATIVE Final    Comment:        The GeneXpert MRSA Assay (FDA approved for NASAL specimens only), is one component of a comprehensive MRSA colonization surveillance program. It is not intended to diagnose MRSA infection nor to guide or monitor treatment for MRSA infections.          Studies: No results found.      Scheduled Meds: . amLODipine  5 mg Oral Daily  . antiseptic oral rinse  7 mL Mouth Rinse q12n4p  . aspirin  81 mg  Oral Daily  . chlorhexidine  15 mL Mouth Rinse BID  . enoxaparin (LOVENOX) injection  30 mg Subcutaneous Q24H  . pantoprazole  40 mg Oral BID  . potassium chloride SA  10 mEq Oral BID  . predniSONE  5 mg Oral QODAY   Continuous Infusions:    Principal Problem:   Suicide attempt [facility]) Active Problems:   Spinal stenosis of lumbar region   Diabetes mellitus ([place])   COPD (chronic obstructive pulmonary disease) ([facility])   Diabetes mellitus with neurological manifestation ([facility])   AKI (acute kidney injury) ([place])   Opiate overdose   Protein-calorie malnutrition, severe    Time spent: 20 minutes.    Vernell Leep, MD, FACP, FHM. [facility] Pager [phone]  If 7PM-7AM, please contact night-coverage [email or web address] Password [facility] 01/27/2016, 6:32 PM

## 2016-01-28 DIAGNOSIS — T402X2A Poisoning by other opioids, intentional self-harm, initial encounter: Secondary | ICD-10-CM | POA: Diagnosis not present

## 2016-01-28 LAB — GLUCOSE, CAPILLARY
Glucose-Capillary: 102 mg/dL — ABNORMAL HIGH (ref 65–99)
Glucose-Capillary: 102 mg/dL — ABNORMAL HIGH (ref 65–99)
Glucose-Capillary: 119 mg/dL — ABNORMAL HIGH (ref 65–99)
Glucose-Capillary: 130 mg/dL — ABNORMAL HIGH (ref 65–99)
Glucose-Capillary: 91 mg/dL (ref 65–99)

## 2016-01-28 MED ORDER — AMLODIPINE BESYLATE 10 MG PO TABS
10.0000 mg | ORAL_TABLET | Freq: Every day | ORAL | Status: DC
  Administered 2016-01-28 – 2016-02-02 (×6): 10 mg via ORAL
  Filled 2016-01-28 (×6): qty 1

## 2016-01-28 MED ORDER — TRAMADOL HCL 50 MG PO TABS
50.0000 mg | ORAL_TABLET | Freq: Four times a day (QID) | ORAL | Status: DC | PRN
  Administered 2016-01-28 – 2016-02-02 (×16): 50 mg via ORAL
  Filled 2016-01-28 (×16): qty 1

## 2016-01-28 MED ORDER — TRAMADOL HCL 50 MG PO TABS: 50.0000 mg | ORAL_TABLET | Freq: Four times a day (QID) | ORAL | Status: DC | PRN

## 2016-01-28 MED ORDER — AMLODIPINE BESYLATE 10 MG PO TABS: 10.0000 mg | ORAL_TABLET | Freq: Every day | ORAL | Status: DC

## 2016-01-28 NOTE — Progress Notes (Signed)
Disposition CSW completed patient referrals to the following Geropsychiatry inpatient facilities:   [place]  CSW will continue to follow patient for placement needs.  [place] Disposition CSW [phone]

## 2016-01-28 NOTE — Discharge Summary (Signed)
[name], is a 70 y.o. female  DOB 17-Feb-1946  MRN [id number].  Admission date:  01/25/2016  Admitting Physician  [name], MD  Discharge Date:  01/28/2016   Primary MD  Thressa Sheller, MD  Recommendations for primary care physician for things to follow:   Check CBC, BMP in 7-10 days. Monitor narcotic and benzodiazepine use.   Admission Diagnosis  AKI (acute kidney injury) ([place]) [N17.9] Opiate overdose, intentional self-harm, initial encounter ([place]) [T40.602A]   Discharge Diagnosis  AKI (acute kidney injury) ([place]) [N17.9] Opiate overdose, intentional self-harm, initial encounter ([place]) [T40.602A]     Principal Problem:   Suicide attempt [facility]) Active Problems:   Spinal stenosis of lumbar region   Diabetes mellitus ([place])   COPD (chronic obstructive pulmonary disease) ([place])   Diabetes mellitus with neurological manifestation ([place])   AKI (acute kidney injury) ([place])   Opiate overdose   Protein-calorie malnutrition, severe      Past Medical History  Diagnosis Date  . Asthma   . HTN (hypertension)   . Grave's disease   . Fibromyalgia   . Thrombophlebitis   . Depression   . Angina   . Shortness of breath   . COPD (chronic obstructive pulmonary disease) ([place])   . Sleep apnea   . Blood dyscrasia     hx of thrombphlebitis  . Recurrent upper respiratory infection (URI)   . GERD (gastroesophageal reflux disease)   . Headache(784.0)   . Arthritis   . CAD (coronary artery disease)   . Chronic kidney disease   . Peripheral vascular disease ([place])   . Diabetes mellitus   . Ulcer   . Diarrhea     chronic   . Thyroid disorder   . Anxiety and depression   . Chronic pain     leg and feet  . DDD (degenerative disc disease)   . Rhinitis   . OP (osteoporosis)   . Vitamin B 12 deficiency   . PUD  (peptic ulcer disease)   . DDD (degenerative disc disease)   . Spinal stenosis   . Spinal stenosis of lumbar region 04/23/2013  . Tobacco use disorder 04/23/2013  . DVT (deep venous thrombosis) ([place])   . Carotid artery occlusion   . Parkinson's disease ([place])   . Dry mouth     Past Surgical History  Procedure Laterality Date  . Gastrectomy      age 70   Part of small intestin and part of stomach  . Cholecystectomy  1999  . Carotid endarterectomy Left Sept. 20,2011    cea  . Left heart catheterization with coronary angiogram N/A 10/02/2011    Procedure: LEFT HEART CATHETERIZATION WITH CORONARY ANGIOGRAM;  Surgeon: Sueanne Margarita, MD;  Location: [place] [facility] CATH LAB;  Service: Cardiovascular;  Laterality: N/A;       HPI  from the history and physical done on the day of admission:    70 year old female patient with history of asthma/COPD, HTN, GERD, CAD, DM, anxiety, depression, chronic pain, was found by her  daughter on 01/25/16 after patient had overdosed on her opioid pain pills and was trying to write a suicide note. It was suspected that she took about 50 of her oxycodone pills. Patient indicated that she received a phone call from her 48 office indicating that they were no longer going to treat her pain and that her roommate had been taking her pain pills which may have led her treating physician to believe that she inappropriately took them. She got upset about this and attempted to kill herself by drug overdose. She received a dose of Narcan via EMS prior to arrival to the ED. She was admitted to stepdown for further management. Mental status changes have resolved. Psychiatric consulted. Suicide precautions in place. Medically stable for discharge to [facility] when bed available.      Hospital Course:     Intentional opioid overdose with attempted suicide. Improved after Narcan in the ER, mentation back to normal, seen by psych awaits [place] [facility] bed. Medically now stable.  Underlying  depression. Seen by psych, patient has an IVC done in the ER. Per psych transfer to [facility].  Essential hypertension. Increase Norvasc dose. Monitor blood pressure.  History of asthma/COPD. Stable no wheezing. Only supportive care.  Diet-controlled type 2 diabetes mellitus. Low-carb diet. Not on meds.  Dehydration related ARF. Resolved after IV fluids. Repeat BMP in 7-10 days.  Chronic back pain. Discontinued home narcotics due to overdose and suicidal attempt, tramadol when necessary.  History of anxiety. Placed on benzodiazepine by psych continue.   Discharge Condition: Fair  Follow UP  Follow-up Information    Follow up with Thressa Sheller, MD. Schedule an appointment as soon as possible for a visit in 1 week.   Specialty:  Internal Medicine   Contact information:   [place], [place]  [phone]        Consults obtained - Psych  Diet and Activity recommendation: See Discharge Instructions below  Discharge Instructions       Discharge Instructions    Diet - low sodium heart healthy    Complete by:  As directed      Discharge instructions    Complete by:  As directed   Follow with Primary MD Thressa Sheller, MD in 7 days   Get CBC, CMP, 2 view Chest X ray checked  by Primary MD next visit.    Activity: As tolerated with Full fall precautions use walker/cane & assistance as needed   Disposition [facility]   Diet:   Heart Healthy    For Heart failure patients - Check your Weight same time everyday, if you gain over 2 pounds, or you develop in leg swelling, experience more shortness of breath or chest pain, call your Primary MD immediately. Follow Cardiac Low Salt Diet and 1.5 lit/day fluid restriction.   On your next visit with your primary care physician please Get Medicines reviewed and adjusted.   Please request your Prim.MD to go over all Hospital Tests and Procedure/Radiological results at the follow up, please get all  Hospital records sent to your Prim MD by signing hospital release before you go home.   If you experience worsening of your admission symptoms, develop shortness of breath, life threatening emergency, suicidal or homicidal thoughts you must seek medical attention immediately by calling 911 or calling your MD immediately  if symptoms less severe.  You Must read complete instructions/literature along with all the possible adverse reactions/side effects for all the Medicines you take and that have been  prescribed to you. Take any new Medicines after you have completely understood and accpet all the possible adverse reactions/side effects.   Do not drive, operating heavy machinery, perform activities at heights, swimming or participation in water activities or provide baby sitting services if your were admitted for syncope or siezures until you have seen by Primary MD or a Neurologist and advised to do so again.  Do not drive when taking Pain medications.    Do not take more than prescribed Pain, Sleep and Anxiety Medications  Special Instructions: If you have smoked or chewed Tobacco  in the last 2 yrs please stop smoking, stop any regular Alcohol  and or any Recreational drug use.  Wear Seat belts while driving.   Please note  You were cared for by a hospitalist during your hospital stay. If you have any questions about your discharge medications or the care you received while you were in the hospital after you are discharged, you can call the unit and asked to speak with the hospitalist on call if the hospitalist that took care of you is not available. Once you are discharged, your primary care physician will handle any further medical issues. Please note that NO REFILLS for any discharge medications will be authorized once you are discharged, as it is imperative that you return to your primary care physician (or establish a relationship with a primary care physician if you do not have one) for  your aftercare needs so that they can reassess your need for medications and monitor your lab values.     Increase activity slowly    Complete by:  As directed              Discharge Medications       Medication List    STOP taking these medications        diphenhydrAMINE 25 MG tablet  Commonly known as:  BENADRYL     Oxycodone HCl 10 MG Tabs     promethazine 25 MG tablet  Commonly known as:  PHENERGAN      TAKE these medications        acetaminophen 500 MG tablet  Commonly known as:  TYLENOL  Take 500 mg by mouth every 6 (six) hours as needed for moderate pain or headache.     albuterol-ipratropium 18-103 MCG/ACT inhaler  Commonly known as:  COMBIVENT  Inhale 2 puffs into the lungs every 6 (six) hours as needed for shortness of breath (sob). For shortness of breath     amLODipine 10 MG tablet  Commonly known as:  NORVASC  Take 1 tablet (10 mg total) by mouth daily.     aspirin 81 MG tablet  Take 81 mg by mouth daily.     calcium acetate 667 MG capsule  Commonly known as:  PHOSLO  Take 667 mg by mouth 2 (two) times daily.     clonazePAM 1 MG tablet  Commonly known as:  KLONOPIN  Take 1 mg by mouth 2 (two) times daily as needed for anxiety (anxiety). For anxiety     metoCLOPramide 5 MG tablet  Commonly known as:  REGLAN  Take 5 mg by mouth 4 (four) times daily as needed (stomach pain). Take 1 tablet 30 minutes before meals and at bedtime     nitroGLYCERIN 0.4 MG SL tablet  Commonly known as:  NITROSTAT  Place 0.4 mg under the tongue every 5 (five) minutes as needed. For chest pain     ondansetron 8 MG  tablet  Commonly known as:  ZOFRAN  Take 8 mg by mouth every 8 (eight) hours as needed for nausea or vomiting.     pantoprazole 40 MG tablet  Commonly known as:  PROTONIX  Take 40 mg by mouth 2 (two) times daily.     potassium chloride SA 20 MEQ tablet  Commonly known as:  K-DUR,KLOR-CON  Take 10 mEq by mouth 2 (two) times daily.     predniSONE 5 MG  tablet  Commonly known as:  DELTASONE  Take 5 mg by mouth every other day.     PRENATAL PO  Take 1 tablet by mouth daily.     traMADol 50 MG tablet  Commonly known as:  ULTRAM  Take 1 tablet (50 mg total) by mouth every 6 (six) hours as needed for moderate pain.        Major procedures and Radiology Reports - PLEASE review detailed and final reports for all details, in brief -       No results found.  Micro Results      Recent Results (from the past 240 hour(s))  MRSA PCR Screening     Status: None   Collection Time: 01/25/16  8:23 PM  Result Value Ref Range Status   MRSA by PCR NEGATIVE NEGATIVE Final    Comment:        The GeneXpert MRSA Assay (FDA approved for NASAL specimens only), is one component of a comprehensive MRSA colonization surveillance program. It is not intended to diagnose MRSA infection nor to guide or monitor treatment for MRSA infections.        Today   Subjective    [name] today has no headache,no chest abdominal pain,no new weakness tingling or numbness, feels much better wants to go home today.     Objective   Blood pressure 160/82, pulse 88, temperature 98.2 F (36.8 C), temperature source Oral, resp. rate 18, height '5\' 4"'$  (1.626 m), weight 49.7 kg (109 lb 9.1 oz), SpO2 96 %.   Intake/Output Summary (Last 24 hours) at 01/28/16 1204 Last data filed at 01/28/16 0800  Gross per 24 hour  Intake    480 ml  Output      0 ml  Net    480 ml    Exam Awake Alert, Oriented x 3, No new F.N deficits, Normal affect [place].AT,PERRAL Supple Neck,No JVD, No cervical lymphadenopathy appriciated.  Symmetrical Chest wall movement, Good air movement bilaterally, CTAB RRR,No Gallops,Rubs or new Murmurs, No Parasternal Heave +ve B.Sounds, Abd Soft, Non tender, No organomegaly appriciated, No rebound -guarding or rigidity. No Cyanosis, Clubbing or edema, No new Rash or bruise   Data Review   CBC w Diff: Lab Results  Component Value  Date   WBC 5.2 01/27/2016   HGB 10.8* 01/27/2016   HCT 32.2* 01/27/2016   PLT 268 01/27/2016   LYMPHOPCT 12 01/25/2016   MONOPCT 11 01/25/2016   EOSPCT 0 01/25/2016   BASOPCT 0 01/25/2016    CMP: Lab Results  Component Value Date   NA 139 01/27/2016   K 4.2 01/27/2016   CL 113* 01/27/2016   CO2 18* 01/27/2016   BUN 16 01/27/2016   CREATININE 1.07* 01/27/2016   PROT 5.8* 01/25/2016   ALBUMIN 2.9* 01/25/2016   BILITOT 0.3 01/25/2016   ALKPHOS 95 01/25/2016   AST 16 01/25/2016   ALT 19 01/25/2016  .   Total Time in preparing paper work, data evaluation and todays exam - 35 minutes  [place]  K M.D on 01/28/2016 at 12:04 PM  [facility]   Office  [phone]

## 2016-01-29 DIAGNOSIS — T1491 Suicide attempt: Secondary | ICD-10-CM | POA: Diagnosis not present

## 2016-01-29 DIAGNOSIS — T402X2A Poisoning by other opioids, intentional self-harm, initial encounter: Secondary | ICD-10-CM | POA: Diagnosis not present

## 2016-01-29 LAB — GLUCOSE, CAPILLARY
Glucose-Capillary: 111 mg/dL — ABNORMAL HIGH (ref 65–99)
Glucose-Capillary: 86 mg/dL (ref 65–99)
Glucose-Capillary: 116 mg/dL — ABNORMAL HIGH (ref 65–99)
Glucose-Capillary: 105 mg/dL — ABNORMAL HIGH (ref 65–99)
Glucose-Capillary: 112 mg/dL — ABNORMAL HIGH (ref 65–99)

## 2016-01-29 NOTE — Progress Notes (Signed)
Chart reviewed. Vitals stable. Pt asleep. Awaiting transfer to inpatient psychiatry.  Doree Barthel, MD [facility] [email or web address] password [facility]

## 2016-01-30 DIAGNOSIS — T402X2A Poisoning by other opioids, intentional self-harm, initial encounter: Secondary | ICD-10-CM | POA: Diagnosis not present

## 2016-01-30 LAB — GLUCOSE, CAPILLARY
Glucose-Capillary: 86 mg/dL (ref 65–99)
Glucose-Capillary: 108 mg/dL — ABNORMAL HIGH (ref 65–99)
Glucose-Capillary: 136 mg/dL — ABNORMAL HIGH (ref 65–99)
Glucose-Capillary: 179 mg/dL — ABNORMAL HIGH (ref 65–99)

## 2016-01-30 MED ORDER — NICOTINE 14 MG/24HR TD PT24
14.0000 mg | MEDICATED_PATCH | Freq: Every day | TRANSDERMAL | Status: DC
  Administered 2016-01-30 – 2016-02-02 (×4): 14 mg via TRANSDERMAL
  Filled 2016-01-30 (×4): qty 1

## 2016-01-30 NOTE — Progress Notes (Signed)
Chart reviewed. Vitals stable. Pt asleep. Awaiting transfer to inpatient psychiatry- called x 5 for psych re-eval, no answer at [facility] DO

## 2016-01-30 NOTE — Progress Notes (Signed)
Followed up on inpatient referrals.  Referred to: [facility]- per Keith Rake  Declined: [facility]- due to "chronic pain- unable to manage pain issues" [facility]- due to medical acuity per [facility] due to medical acuity per [facility] Leader- due to medical acuity per Seth Bake  Left voicemails for [facility] and [facility] bed/referral status. Einar Grad, [facility] at capacity. Cristal Ford advised call back after 3pm in case there have been d/c's.  Sharren Bridge, MSW, LCSW Clinical Social Work, Disposition  01/30/2016 [phone]

## 2016-01-31 DIAGNOSIS — T402X2A Poisoning by other opioids, intentional self-harm, initial encounter: Secondary | ICD-10-CM | POA: Diagnosis not present

## 2016-01-31 DIAGNOSIS — R45851 Suicidal ideations: Secondary | ICD-10-CM | POA: Diagnosis not present

## 2016-01-31 DIAGNOSIS — F322 Major depressive disorder, single episode, severe without psychotic features: Secondary | ICD-10-CM | POA: Diagnosis not present

## 2016-01-31 DIAGNOSIS — T1491 Suicide attempt: Secondary | ICD-10-CM | POA: Diagnosis not present

## 2016-01-31 LAB — GLUCOSE, CAPILLARY
Glucose-Capillary: 108 mg/dL — ABNORMAL HIGH (ref 65–99)
Glucose-Capillary: 96 mg/dL (ref 65–99)
Glucose-Capillary: 134 mg/dL — ABNORMAL HIGH (ref 65–99)

## 2016-01-31 MED ORDER — GABAPENTIN 100 MG PO CAPS
100.0000 mg | ORAL_CAPSULE | Freq: Every day | ORAL | Status: DC
  Administered 2016-01-31 – 2016-02-01 (×2): 100 mg via ORAL
  Filled 2016-01-31 (×3): qty 1

## 2016-01-31 MED ORDER — DULOXETINE HCL 20 MG PO CPEP
40.0000 mg | ORAL_CAPSULE | Freq: Every day | ORAL | Status: DC
  Administered 2016-01-31 – 2016-02-02 (×3): 40 mg via ORAL
  Filled 2016-01-31 (×3): qty 2

## 2016-01-31 NOTE — Progress Notes (Signed)
Per Curly [place] at [facility] geriatric unit, patient is still under review and when beds become available will contact the ED.  Verlon Setting, [place] Disposition staff 01/31/2016 7:33 PM

## 2016-01-31 NOTE — Progress Notes (Signed)
Patient is requesting all health care providers to please discuss care and plan with her daughter, [name], first before talking to any other family member.

## 2016-01-31 NOTE — Progress Notes (Signed)
Chart reviewed. Vitals stable. Patient wants to get better and get home to dogs and cats. Awaiting transfer to inpatient psychiatry D/c summary done-- await placement-- seen by psych and still meets criteria for inpatient  Eulogio Bear DO

## 2016-01-31 NOTE — Progress Notes (Signed)
CSW assisting with d/c planning. Pt continues to require in pt psych.   [name] - at capacity - referral sent   [name] -declined due to medical acuity  [name]- referral sent - will be contacted if bed becomes  available   [name] - at capacity - will call if bed becomes available  [name] -referal sent - will be contacted if bed becomes available  ED CSW [phone] ) will assist with dc planning to in pt psych if bed becomes available this evening.  Werner Lean LCSW [phone]

## 2016-01-31 NOTE — Progress Notes (Signed)
Nutrition Follow-up  DOCUMENTATION CODES:   Severe malnutrition in context of acute illness/injury  INTERVENTION:  - Discontinue Boost Breeze. - Continue to encourage good PO intake of meals. - Will continue to monitor for nutritional needs.   NUTRITION DIAGNOSIS:   Inadequate oral intake related to poor appetite as evidenced by per patient/family report.-ongoing  GOAL:   Patient will meet greater than or equal to 90% of their needs-minimally met  MONITOR:   PO intake, Supplement acceptance, Labs, Weight trends, I & O's  ASSESSMENT:   70 yo female h/o chronic back pain, copd, CAD, CKD, DM amoungst others found by her daughter today after overdosing on her pain pills and trying to write a suicide note. Suspected pt took about 50 of her oxycondone pills. Pt says she took "handfulls". She says she got a phone call yesterday from her pain doctor office who told here they were no longer going to treat her pain. Pt says they gave no reason. On further questioning, she reveals that her roommate has been taking her pain pills and so her pain doctor thinks she is inappropriately using them. Pt got very upset about this. And says she tried to kill herself because "i am tired of being in pain all the time, im miserable". Pt is up and able to hold a conversation at this time. She was given narcan 47m with EMS per nasal and has not had any further narcan since. Pt referred for admission for opiate overdose with suicidal intent.  4/4 Pt seen for follow up. Pt consuming 25-100% of meals since 3/30. Pt denies N/V associated with eating. Pt reports general pain all over the body so hard to pinpoint if food is making her feel bad. Pt was asked about nutritional supplements and she stated that she does not like them. Pt states she can find items on the menu that she will try to eat and Intern emphasized the importance of balanced meals. Intern continued to encourage PO intake and pt seemed  receptive. Pt stated she is not hungry because she is receiving plenty of food.   Medications reviewed: KCl 10 mEq BID  Labs reviewed: CBGs 86-179 mg/dl  3/30  - Patient reports that she has had a poor appetite and has not been eating more than snacks throughout the day PTA.  - States that the pain on her left side of her body makes it hard to prepare meals.   - Reports that she drinks a lot of juice and does not like water. - NFPE: moderate fat depletion, moderate muscle depletion, and no edema.  -  Per chart review, patients weight has been fairlystable between 98-109# over the past year.  - Order Boost Breeze TID.  - Continue to encourage good PO intake of meals and supplements.  Diet Order:  Diet regular Room service appropriate?: Yes; Fluid consistency:: Thin Diet - low sodium heart healthy  Skin:  Reviewed, no issues  Last BM:  3/29  Height:   Ht Readings from Last 1 Encounters:  01/25/16 5' 4"  (1.626 m)    Weight:   Wt Readings from Last 1 Encounters:  01/29/16 110 lb 1.6 oz (49.941 kg)    Ideal Body Weight:  54.5 kg  BMI:  Body mass index is 18.89 kg/(m^2).  Estimated Nutritional Needs:   Kcal:  1400-1600  Protein:  55-65 grams  Fluid:  >/= 1.5 L  EDUCATION NEEDS:   No education needs identified at this time  MGeoffery Lyons M[place]  Dietetic Intern Pager [phone]'

## 2016-01-31 NOTE — Consult Note (Signed)
Dr [facility] Face-to-Face Psychiatry Consult follow-up  Reason for Consult:  Intentional suicide overdose Referring Physician:  Dr. Algis Liming Patient Identification: [name] MRN:  [id number] Principal Diagnosis: Suicide attempt [facility]) Diagnosis:   Patient Active Problem List   Diagnosis Date Noted  . Protein-calorie malnutrition, severe [E43] 01/26/2016  . Opiate overdose [T40.601A] 01/25/2016  . Suicide attempt ([place]) [T14.91] 01/25/2016  . Depression [F32.9]   . Dyslipidemia [E78.5]   . Gastroesophageal reflux disease without esophagitis [K21.9]   . UTI (lower urinary tract infection) [N39.0] 07/29/2015  . COPD (chronic obstructive pulmonary disease) ([place]) [J44.9] 07/29/2015  . Diabetes mellitus with neurological manifestation ([place]) [E11.49] 07/29/2015  . Frequent falls [R29.6] 07/29/2015  . HTN (hypertension) [I10] 07/29/2015  . AKI (acute kidney injury) ([place]) [N17.9] 07/29/2015  . Sepsis ([place]) [A41.9] 07/29/2015  . Aftercare following surgery of the circulatory system, [place] [Z48.812] 11/25/2013  . Spinal stenosis of lumbar region [M48.06] 04/23/2013  . Diabetes mellitus ([place]) [E11.9] 04/23/2013  . Tremor due to multiple drugs [G25.1] 04/23/2013  . Tobacco use disorder [F17.200] 04/23/2013  . COPD exacerbation ([place]) [J44.1] 04/23/2013  . Occlusion and stenosis of carotid artery without mention of cerebral infarction [I65.29] 03/12/2012  . Viral bronchitis-possible h. infl vs Norovirus [J20.8] 11/21/2011    Total Time spent with patient: 30 minutes  Subjective:   [name] is a 70 y.o. female patient admitted with intentional suicide overdose.  HPI:  [name] is a 70 years old female admitted to [facility] for status post intentional suicide by taking prescription medication overdose. Patient seen, chart reviewed for the face-to-face psychiatric consultation and evaluation of increased symptoms of depression and status post suicidal attempt. Patient  reported she has been suffering with chronic back pain and having hard time to obtain pain management provider. Patient initially referred to the other pain clinic but later willing to continue pain management at [facility] by Dr. Mee Hives. Patient stated she got a message from the office stating that she won't be able to maintain in the same [facility]. Patient is reluctant to provide more details. Patient is also under significant stress due to financial difficulties, she has a 3 nonfamily members living with her with a limited to no financial income. Patient has a daughter and older sister who is supportive to her but patient does not ask because she is too proud to ask. Patient stated that she has been hurting too long and all over and she has intention to end her life when she took the pills especially oxycodone, reportedly handfuls and also trying to write a suicide note. Patient endorses severe headache, depression, anxiety disturbance of sleep and appetite, hopeless and helpless with her situation. Patient does not contract for safety at this time and required acute inpatient psychiatric hospitalization at geriatric psychiatry when medically stable. Past Psychiatric History: none reported  Interval history: Patient seen face-to-face for psychiatric consultation follow-up today. Case discussed with Dr. Eliseo Young and LCSW. Patient is awake, alert, oriented to time place person and situation. Patient reportedly feeling depressed, sad, tearful, helpless and worthless since her pain management provider stopped seeing her because she has been shaving her medication and also lost month ago. Patient stated she does not know what to do and how to find a new provider. Patient reported she has is daughter who provided lock box to keep her medications but she cannot find a new provider for her. She also depressed and sad about missing her dog and cat at  home. Patient meets criteria for acute geriatric  psychiatric hospitalization and does not seems to be stable enough to be discharged from the hospital.  Risk to Self: Is patient at risk for suicide?: Yes Risk to Others:   Prior Inpatient Therapy:   Prior Outpatient Therapy:    Past Medical History:  Past Medical History  Diagnosis Date  . Asthma   . HTN (hypertension)   . Grave's disease   . Fibromyalgia   . Thrombophlebitis   . Depression   . Angina   . Shortness of breath   . COPD (chronic obstructive pulmonary disease) ([place])   . Sleep apnea   . Blood dyscrasia     hx of thrombphlebitis  . Recurrent upper respiratory infection (URI)   . GERD (gastroesophageal reflux disease)   . Headache(784.0)   . Arthritis   . CAD (coronary artery disease)   . Chronic kidney disease   . Peripheral vascular disease ([place])   . Diabetes mellitus   . Ulcer   . Diarrhea     chronic   . Thyroid disorder   . Anxiety and depression   . Chronic pain     leg and feet  . DDD (degenerative disc disease)   . Rhinitis   . OP (osteoporosis)   . Vitamin B 12 deficiency   . PUD (peptic ulcer disease)   . DDD (degenerative disc disease)   . Spinal stenosis   . Spinal stenosis of lumbar region 04/23/2013  . Tobacco use disorder 04/23/2013  . DVT (deep venous thrombosis) ([place])   . Carotid artery occlusion   . Parkinson's disease ([place])   . Dry mouth     Past Surgical History  Procedure Laterality Date  . Gastrectomy      age 54   Part of small intestin and part of stomach  . Cholecystectomy  1999  . Carotid endarterectomy Left Sept. 20,2011    cea  . Left heart catheterization with coronary angiogram N/A 10/02/2011    Procedure: LEFT HEART CATHETERIZATION WITH CORONARY ANGIOGRAM;  Surgeon: Sueanne Margarita, MD;  Location: [facility] CATH LAB;  Service: Cardiovascular;  Laterality: N/A;   Family History:  Family History  Problem Relation Age of Onset  . Diabetes Mother   . Hyperlipidemia Mother   . Heart attack Mother   . Other Mother      varicose veins,respiratory,stroke  . Heart disease Mother     before age 45  . Hypertension Mother   . Varicose Veins Mother   . Diabetes Father   . Heart disease Father     before age 42  . Hyperlipidemia Father   . Heart attack Father   . Other Father     varicose veins  . Hypertension Father   . Varicose Veins Father   . Diabetes Sister   . Heart disease Sister     before age 32  . Hyperlipidemia Sister   . Heart attack Sister   . Other Sister     varicose veins  . Hypertension Sister   . Varicose Veins Sister   . Peripheral vascular disease Sister   . Diabetes Sister   . Hyperlipidemia Sister   . Hypertension Sister   . Varicose Veins Sister    Family Psychiatric  History: Unknown  Social History:  History  Alcohol Use No     History  Drug Use No    Social History   Social History  . Marital Status: Widowed  Spouse Name: N/A  . Number of Children: 1  . Years of Education: N/A   Social History Main Topics  . Smoking status: Current Every Day Smoker -- 2.00 packs/day for 50 years    Types: Cigarettes  . Smokeless tobacco: Never Used     Comment: cessation info given and reviewed  . Alcohol Use: No  . Drug Use: No  . Sexual Activity: No   Other Topics Concern  . None   Social History Narrative   Additional Social History:    Allergies:   Allergies  Allergen Reactions  . Clarithromycin Nausea And Vomiting and Nausea Only  . Bee Venom     Labs:  Results for orders placed or performed during the hospital encounter of 01/25/16 (from the past 48 hour(s))  Glucose, capillary     Status: Abnormal   Collection Time: 01/29/16  1:32 PM  Result Value Ref Range   Glucose-Capillary 116 (H) 65 - 99 mg/dL  Glucose, capillary     Status: Abnormal   Collection Time: 01/29/16  4:27 PM  Result Value Ref Range   Glucose-Capillary 105 (H) 65 - 99 mg/dL   Comment 1 Notify RN   Glucose, capillary     Status: Abnormal   Collection Time: 01/29/16  9:23 PM   Result Value Ref Range   Glucose-Capillary 112 (H) 65 - 99 mg/dL   Comment 1 Notify RN   Glucose, capillary     Status: None   Collection Time: 01/30/16  7:30 AM  Result Value Ref Range   Glucose-Capillary 86 65 - 99 mg/dL  Glucose, capillary     Status: Abnormal   Collection Time: 01/30/16 11:51 AM  Result Value Ref Range   Glucose-Capillary 108 (H) 65 - 99 mg/dL  Glucose, capillary     Status: Abnormal   Collection Time: 01/30/16  5:13 PM  Result Value Ref Range   Glucose-Capillary 136 (H) 65 - 99 mg/dL  Glucose, capillary     Status: Abnormal   Collection Time: 01/30/16  9:04 PM  Result Value Ref Range   Glucose-Capillary 179 (H) 65 - 99 mg/dL   Comment 1 Notify RN    Comment 2 Document in Chart   Glucose, capillary     Status: Abnormal   Collection Time: 01/31/16  7:47 AM  Result Value Ref Range   Glucose-Capillary 108 (H) 65 - 99 mg/dL   Comment 1 Notify RN     Current Facility-Administered Medications  Medication Dose Route Frequency Provider Last Rate Last Dose  . acetaminophen (TYLENOL) tablet 650 mg  650 mg Oral Q6H PRN Ambrose Finland, MD   650 mg at 01/31/16 1023  . amLODipine (NORVASC) tablet 10 mg  10 mg Oral Daily Thurnell Lose, MD   10 mg at 01/31/16 1023  . antiseptic oral rinse (CPC / CETYLPYRIDINIUM CHLORIDE 0.05%) solution 7 mL  7 mL Mouth Rinse q12n4p [name], MD   7 mL at 01/29/16 1502  . aspirin chewable tablet 81 mg  81 mg Oral Daily Modena Jansky, MD   81 mg at 01/31/16 1030  . chlorhexidine (PERIDEX) 0.12 % solution 15 mL  15 mL Mouth Rinse BID [name], MD   15 mL at 01/30/16 0942  . clonazePAM (KLONOPIN) tablet 1 mg  1 mg Oral BID PRN Modena Jansky, MD   1 mg at 01/30/16 2100  . enoxaparin (LOVENOX) injection 30 mg  30 mg Subcutaneous Q24H [name], MD  30 mg at 01/30/16 2100  . hydrALAZINE (APRESOLINE) injection 10 mg  10 mg Intravenous Q4H PRN Gardiner Barefoot, NP   10 mg at 01/26/16 1607  . naloxone  [facility]) injection 0.4 mg  0.4 mg Intravenous PRN [name], MD      . nicotine (NICODERM CQ - dosed in mg/24 hours) patch 14 mg  14 mg Transdermal Daily Geradine Girt, DO   14 mg at 01/31/16 1025  . pantoprazole (PROTONIX) EC tablet 40 mg  40 mg Oral BID Modena Jansky, MD   40 mg at 01/31/16 1023  . potassium chloride (K-DUR,KLOR-CON) CR tablet 10 mEq  10 mEq Oral BID Modena Jansky, MD   10 mEq at 01/31/16 1023  . predniSONE (DELTASONE) tablet 5 mg  5 mg Oral QODAY Modena Jansky, MD   5 mg at 01/30/16 0943  . traMADol (ULTRAM) tablet 50 mg  50 mg Oral Q6H PRN Thurnell Lose, MD   50 mg at 01/31/16 0737    Musculoskeletal: Strength & Muscle Tone: decreased Gait & Station: unable to stand Patient leans: N/A  Psychiatric Specialty Exam: ROS complaining about her depression, suicidal intention and attempt and chronic back pain questionable inappropriate use of opiates.  No Fever-chills, No Headache, No changes with Vision or hearing, reports vertigo No problems swallowing food or Liquids, No Chest pain, Cough or Shortness of Breath, No Abdominal pain, No Nausea or Vommitting, Bowel movements are regular, No Blood in stool or Urine, No dysuria, No new skin rashes or bruises, No new joints pains-aches,  No new weakness, tingling, numbness in any extremity, No recent weight gain or loss, No polyuria, polydypsia or polyphagia,  A full 10 point Review of Systems was done, except as stated above, all other Review of Systems were negative.  Blood pressure 123/61, pulse 71, temperature 98.3 F (36.8 C), temperature source Oral, resp. rate 18, height '5\' 4"'$  (1.626 m), weight 49.941 kg (110 lb 1.6 oz), SpO2 98 %.Body mass index is 18.89 kg/(m^2).  General Appearance: Guarded  Eye Contact::  Good  Speech:  Clear and Coherent  Volume:  Decreased  Mood:  Anxious, Depressed and Irritable  Affect:  Constricted, Depressed and Tearful  Thought Process:  Coherent and Goal Directed   Orientation:  Full (Time, Place, and Person)  Thought Content:  WDL  Suicidal Thoughts:  Yes.  with intent/plan  Homicidal Thoughts:  No  Memory:  Immediate;   Good Recent;   Fair Remote;   Fair  Judgement:  Impaired  Insight:  Fair  Psychomotor Activity:  Decreased  Concentration:  Good  Recall:  Good  Fund of Knowledge:Good  Language: Good  Akathisia:  Negative  Handed:  Right  AIMS (if indicated):     Assets:  Communication Skills Desire for Improvement Housing Leisure Time Resilience Social Support Transportation  ADL's:  Impaired  Cognition: WNL  Sleep:      Treatment Plan Summary: Daily contact with patient to assess and evaluate symptoms and progress in treatment and Medication management  Monitor for opiate and benzodiazepine withdrawal symptoms Increase Cymbalta 40 mg daily for depression  We start gabapentin 100 mg at bedtime for mood associated with chronic pain Continue clonazepam 1 mg twice daily as needed for anxiety Refer to the psychiatric social service for geriatric psychiatric placement when medically stable  Disposition: Recommend psychiatric Inpatient admission when medically cleared. Supportive therapy provided about ongoing stressors.  Durward Parcel., MD 01/31/2016 10:47 AM

## 2016-02-01 DIAGNOSIS — F322 Major depressive disorder, single episode, severe without psychotic features: Secondary | ICD-10-CM | POA: Diagnosis not present

## 2016-02-01 DIAGNOSIS — T402X2A Poisoning by other opioids, intentional self-harm, initial encounter: Secondary | ICD-10-CM | POA: Diagnosis not present

## 2016-02-01 DIAGNOSIS — R45851 Suicidal ideations: Secondary | ICD-10-CM | POA: Diagnosis not present

## 2016-02-01 DIAGNOSIS — T1491 Suicide attempt: Secondary | ICD-10-CM | POA: Diagnosis not present

## 2016-02-01 LAB — CREATININE, SERUM
Creatinine, Ser: 1.34 mg/dL — ABNORMAL HIGH (ref 0.44–1.00)
GFR calc Af Amer: 45 mL/min — ABNORMAL LOW (ref >60)
GFR calc non Af Amer: 39 mL/min — ABNORMAL LOW (ref >60)

## 2016-02-01 LAB — GLUCOSE, CAPILLARY
Glucose-Capillary: 100 mg/dL — ABNORMAL HIGH (ref 65–99)
Glucose-Capillary: 85 mg/dL (ref 65–99)
Glucose-Capillary: 117 mg/dL — ABNORMAL HIGH (ref 65–99)
Glucose-Capillary: 113 mg/dL — ABNORMAL HIGH (ref 65–99)

## 2016-02-01 MED ORDER — ENSURE ENLIVE PO LIQD: 237.0000 mL | Freq: Two times a day (BID) | ORAL | Status: DC

## 2016-02-01 NOTE — Progress Notes (Signed)
PROGRESS NOTE    [name]  KGY:[id number]  DOB: 1946/02/13  DOA: 01/25/2016 PCP: Thressa Sheller, MD Outpatient Specialists:   Hospital course: 70 year old female patient with history of asthma/COPD, HTN, GERD, CAD, DM, anxiety, depression, chronic pain, was found by her daughter on 01/25/16 after patient had overdosed on her opioid pain pills and was trying to write a suicide note. It was suspected that she took about 50 of her oxycodone pills. Patient indicated that she received a phone call from her 40 office indicating that they were no longer going to treat her pain and that her roommate had been taking her pain pills which may have led her treating physician to believe that she inappropriately took them. She got upset about this and attempted to kill herself by drug overdose. She received a dose of Narcan via EMS prior to arrival to the ED. She was admitted to stepdown for further management. Mental status changes have resolved. Psychiatric consulted. Suicide precautions in place. Medically stable for discharge to inpatient psych when bed available.   Assessment & Plan:   Opioid overdose - Done in a suicide attempt on 3/29 afternoon. - Received a dose of Narcan via EMS prior to arrival to the ED. Has not received any Narcan since. - Mental status changes have resolved. Hemodynamically stable.  Suicide attempt/anxiety & depression - Continue suicide precautions and 1:1 sitter. Psychiatry was consulted on 01/26/16. Patient has IVC done by ED. - Psychiatry consultation 3/30 appreciated. There are recommend inpatient admission when medically cleared. Patient is medically stable at this time for discharge to inpatient psychiatry when beds available. - Psychiatry has started Cymbalta 30 mg daily for depression and continue clonazepam 1 MG twice a day as needed for anxiety. -had psych re-eval on 4/4-- still need inpatient psych  Essential hypertension - Continue  amlodipine.  Asthma/COPD - Stable  DM 2 - Controlled.  Acute kidney injury - resolved -BMP in AM  Chronic pain/lumbar spinal stenosis/fibromyalgia - Judicious use of pain medications given history of opioid overdose.  Anemia - Stable.    DVT prophylaxis: Lovenox Code Status: Full Family Communication: None at bedside Disposition Plan: await inpatient psych-d/c summary done 4/1   Consultants:  Psychiatry  Procedures:  None  Antimicrobials:  None   Subjective: C/o some back pain-- Kpad being applied Misses animals (dogs/cats)  Objective: Filed Vitals:   01/31/16 0638 01/31/16 1424 01/31/16 2146 02/01/16 0545  BP: 123/61 142/56 131/49 133/66  Pulse: 71 77 79 71  Temp: 98.3 F (36.8 C) 98.6 F (37 C) 98.3 F (36.8 C) 98.3 F (36.8 C)  TempSrc: Oral Oral Oral Oral  Resp: '18 13 16 16  '$ Height:      Weight:      SpO2: 98% 98% 98% 98%    Intake/Output Summary (Last 24 hours) at 02/01/16 0738 Last data filed at 01/31/16 1406  Gross per 24 hour  Intake   1215 ml  Output      0 ml  Net   1215 ml   Filed Weights   01/25/16 2112 01/26/16 0355 01/29/16 0508  Weight: 49.4 kg (108 lb 14.5 oz) 49.7 kg (109 lb 9.1 oz) 49.941 kg (110 lb 1.6 oz)    Exam:  General exam: Moderately built and thinly nourished pleasant elderly female lying comfortably supine in bed. Psychiatric: Pleasant and appropriate. Denies suicidal ideations at this time.    Data Reviewed: Basic Metabolic Panel:  Recent Labs Lab 01/25/16 1700 01/26/16 0315 01/27/16 0263 02/01/16 7858  NA 145 146* 139  --   K 4.1 3.6 4.2  --   CL 114* 117* 113*  --   CO2 22 18* 18*  --   GLUCOSE 106* 88 98  --   BUN 32* 22* 16  --   CREATININE 1.60* 1.27* 1.07* 1.34*  CALCIUM 8.5* 8.4* 8.2*  --    Liver Function Tests:  Recent Labs Lab 01/25/16 1700  AST 16  ALT 19  ALKPHOS 95  BILITOT 0.3  PROT 5.8*  ALBUMIN 2.9*   No results for input(s): LIPASE, AMYLASE in the last 168  hours. No results for input(s): AMMONIA in the last 168 hours. CBC:  Recent Labs Lab 01/25/16 1700 01/26/16 0315 01/27/16 0520  WBC 3.7* 5.1 5.2  NEUTROABS 2.9  --   --   HGB 10.6* 10.8* 10.8*  HCT 32.4* 32.9* 32.2*  MCV 92.3 94.0 92.0  PLT 199 219 268   Cardiac Enzymes: No results for input(s): CKTOTAL, CKMB, CKMBINDEX, TROPONINI in the last 168 hours. BNP (last 3 results) No results for input(s): PROBNP in the last 8760 hours. CBG:  Recent Labs Lab 01/30/16 1713 01/30/16 2104 01/31/16 0747 01/31/16 1145 01/31/16 2141  GLUCAP 136* 179* 108* 96 134*    Recent Results (from the past 240 hour(s))  MRSA PCR Screening     Status: None   Collection Time: 01/25/16  8:23 PM  Result Value Ref Range Status   MRSA by PCR NEGATIVE NEGATIVE Final    Comment:        The GeneXpert MRSA Assay (FDA approved for NASAL specimens only), is one component of a comprehensive MRSA colonization surveillance program. It is not intended to diagnose MRSA infection nor to guide or monitor treatment for MRSA infections.          Studies: No results found.      Scheduled Meds: . amLODipine  10 mg Oral Daily  . antiseptic oral rinse  7 mL Mouth Rinse q12n4p  . aspirin  81 mg Oral Daily  . chlorhexidine  15 mL Mouth Rinse BID  . DULoxetine  40 mg Oral Daily  . enoxaparin (LOVENOX) injection  30 mg Subcutaneous Q24H  . gabapentin  100 mg Oral QHS  . nicotine  14 mg Transdermal Daily  . pantoprazole  40 mg Oral BID  . potassium chloride SA  10 mEq Oral BID  . predniSONE  5 mg Oral QODAY   Continuous Infusions:    Principal Problem:   Suicide attempt [facility]) Active Problems:   Spinal stenosis of lumbar region   Diabetes mellitus ([place])   COPD (chronic obstructive pulmonary disease) ([facility])   Diabetes mellitus with neurological manifestation ([facility])   AKI (acute kidney injury) ([place])   Opiate overdose   Protein-calorie malnutrition, severe    Time spent: 15  minutes.    [place], DO. [facility] Pager [phone]  If 7PM-7AM, please contact night-coverage [email or web address] Password [facility] 02/01/2016, 7:38 AM

## 2016-02-01 NOTE — Progress Notes (Signed)
Followed up on inpatient referrals to gero-psychiatric facilities.  Review of chart indicates pt has been declined at: [facility]- due to "chronic pain- unable to manage pain issues" [facility]- their geriatric psych unit requires prior diagnosis of dementia/Alzheimer's disease [facility]- due to medical acuity per [facility]- due to medical acuity per [facility]- due to medical acuity per [facility]- due to medical acuity  [facility] and was told by RN that referral is not in queue to be reviewed. Advised intake coordinator Cedric may have referral, and to check with him prior to re-faxing. Transferred call to intake coordinator- unable to reach, no voicemail option. Will continue trying to contact.   [facility] (per [place]) and [facility] (per [place]) at capacity today.  [facility] and [facility] do not have geriatric-psych units.)  Sharren Bridge, MSW, LCSW Clinical Social Work, Disposition  02/01/2016

## 2016-02-01 NOTE — Progress Notes (Addendum)
CSW met with pt to assist with d/c planning and offer support. Informed pt that we have not yet been able to find an appropriate in pt psych bed for her but are continuing to look for an opening. CSW had spoken with [facility], Director of [place], prior to meeting with pt for additional recommendations. Since CSW has not been able to locate a geri psych bed for this pt, a referral to [facility] is required.  According to Ms. Rife, IVC paper work will need to be completed prior to [facility] transfer. [facility] is unable to accept a pt without IVC papers. [facility] has a long waiting list at this time.      Pt has asked for assistance locating another pain clinic. Pt also plans to talk with her roommate about helping more at home. " If she ( roommate ) could be more helpful and I could get into another pain clinic, I think things would be much better for me at home. I miss my dogs. I can't believe I did this. I was so hopeless." Pt does not want to go to [facility] but understands that going back home, without any changes being made, is not safe for her. CSW will continue to try to locate geri-psych placement for pt to, hopefully, avoid [place] placement.  Werner Lean LCSW [phone]  17:08 CSW spoke with Richardson Landry at [facility] to confirm pt has been placed on [facility] waiting list. [facility] authorization # : [id number]. # is good from 4/517-02/07/16. IVC papers will be completed once [place] bed is available.  Werner Lean LCSW [phone]

## 2016-02-01 NOTE — Consult Note (Signed)
[facility] Face-to-Face Psychiatry Consult follow-up  Reason for Consult:  Intentional suicide overdose Referring Physician:  Dr. Algis Liming Patient Identification: [name] MRN:  [id number] Principal Diagnosis: Suicide attempt [facility]) Diagnosis:   Patient Active Problem List   Diagnosis Date Noted  . Protein-calorie malnutrition, severe [E43] 01/26/2016  . Opiate overdose [T40.601A] 01/25/2016  . Suicide attempt ([place]) [T14.91] 01/25/2016  . Depression [F32.9]   . Dyslipidemia [E78.5]   . Gastroesophageal reflux disease without esophagitis [K21.9]   . UTI (lower urinary tract infection) [N39.0] 07/29/2015  . COPD (chronic obstructive pulmonary disease) ([place]) [J44.9] 07/29/2015  . Diabetes mellitus with neurological manifestation ([place]) [E11.49] 07/29/2015  . Frequent falls [R29.6] 07/29/2015  . HTN (hypertension) [I10] 07/29/2015  . AKI (acute kidney injury) ([place]) [N17.9] 07/29/2015  . Sepsis ([place]) [A41.9] 07/29/2015  . Aftercare following surgery of the circulatory system, [place] [Z48.812] 11/25/2013  . Spinal stenosis of lumbar region [M48.06] 04/23/2013  . Diabetes mellitus ([place]) [E11.9] 04/23/2013  . Tremor due to multiple drugs [G25.1] 04/23/2013  . Tobacco use disorder [F17.200] 04/23/2013  . COPD exacerbation ([place]) [J44.1] 04/23/2013  . Occlusion and stenosis of carotid artery without mention of cerebral infarction [I65.29] 03/12/2012  . Viral bronchitis-possible h. infl vs Norovirus [J20.8] 11/21/2011    Total Time spent with patient: 30 minutes  Subjective:   [name] is a 70 y.o. female patient admitted with intentional suicide overdose.  HPI:  [name] is a 70 years old female admitted to [facility] for status post intentional suicide by taking prescription medication overdose. Patient seen, chart reviewed for the face-to-face psychiatric consultation and evaluation of increased symptoms of depression and status post suicidal attempt. Patient  reported she has been suffering with chronic back pain and having hard time to obtain pain management provider. Patient initially referred to the other pain clinic but later willing to continue pain management at [name] by Dr. Mee Young. Patient stated she got a message from the office stating that she won't be able to maintain in the same [facility]. Patient is reluctant to provide more details. Patient is also under significant stress due to financial difficulties, she has a 3 nonfamily members living with her with a limited to no financial income. Patient has a daughter and older sister who is supportive to her but patient does not ask because she is too proud to ask. Patient stated that she has been hurting too long and all over and she has intention to end her life when she took the pills especially oxycodone, reportedly handfuls and also trying to write a suicide note. Patient endorses severe headache, depression, anxiety disturbance of sleep and appetite, hopeless and helpless with her situation. Patient does not contract for safety at this time and required acute inpatient psychiatric hospitalization at geriatric psychiatry when medically stable. Past Psychiatric History: none reported  Interval history: Patient seen face-to-face for psychiatric consultation follow-up today. Patient continued to endorse symptoms of depressed, sad, tearful, helpless and worthless since her pain management provider stopped seeing her because she has been sharing her medication and also lost month ago. Patient stated she does not know what to do and how to find a new provider. Patient reported she has is daughter who provided lock box to keep her medications but she cannot find a new provider for her. She also depressed and sad about missing her dog and cat at home. Patient meets criteria for acute geriatric psychiatric hospitalization and does not seems to be  stable enough to be discharged from the  hospital. Reportedly patient is medically stable at this time.   Risk to Self: Is patient at risk for suicide?: Yes Risk to Others:   Prior Inpatient Therapy:   Prior Outpatient Therapy:    Past Medical History:  Past Medical History  Diagnosis Date  . Asthma   . HTN (hypertension)   . Grave's disease   . Fibromyalgia   . Thrombophlebitis   . Depression   . Angina   . Shortness of breath   . COPD (chronic obstructive pulmonary disease) ([place])   . Sleep apnea   . Blood dyscrasia     hx of thrombphlebitis  . Recurrent upper respiratory infection (URI)   . GERD (gastroesophageal reflux disease)   . Headache(784.0)   . Arthritis   . CAD (coronary artery disease)   . Chronic kidney disease   . Peripheral vascular disease ([place])   . Diabetes mellitus   . Ulcer   . Diarrhea     chronic   . Thyroid disorder   . Anxiety and depression   . Chronic pain     leg and feet  . DDD (degenerative disc disease)   . Rhinitis   . OP (osteoporosis)   . Vitamin B 12 deficiency   . PUD (peptic ulcer disease)   . DDD (degenerative disc disease)   . Spinal stenosis   . Spinal stenosis of lumbar region 04/23/2013  . Tobacco use disorder 04/23/2013  . DVT (deep venous thrombosis) ([place])   . Carotid artery occlusion   . Parkinson's disease ([place])   . Dry mouth     Past Surgical History  Procedure Laterality Date  . Gastrectomy      age 25   Part of small intestin and part of stomach  . Cholecystectomy  1999  . Carotid endarterectomy Left Sept. 20,2011    cea  . Left heart catheterization with coronary angiogram N/A 10/02/2011    Procedure: LEFT HEART CATHETERIZATION WITH CORONARY ANGIOGRAM;  Surgeon: Sueanne Margarita, MD;  Location: [place] CATH LAB;  Service: Cardiovascular;  Laterality: N/A;   Family History:  Family History  Problem Relation Age of Onset  . Diabetes Mother   . Hyperlipidemia Mother   . Heart attack Mother   . Other Mother     varicose veins,respiratory,stroke  . Heart  disease Mother     before age 81  . Hypertension Mother   . Varicose Veins Mother   . Diabetes Father   . Heart disease Father     before age 69  . Hyperlipidemia Father   . Heart attack Father   . Other Father     varicose veins  . Hypertension Father   . Varicose Veins Father   . Diabetes Sister   . Heart disease Sister     before age 71  . Hyperlipidemia Sister   . Heart attack Sister   . Other Sister     varicose veins  . Hypertension Sister   . Varicose Veins Sister   . Peripheral vascular disease Sister   . Diabetes Sister   . Hyperlipidemia Sister   . Hypertension Sister   . Varicose Veins Sister    Family Psychiatric  History: Unknown  Social History:  History  Alcohol Use No     History  Drug Use No    Social History   Social History  . Marital Status: Widowed    Spouse Name: N/A  . Number  of Children: 1  . Years of Education: N/A   Social History Main Topics  . Smoking status: Current Every Day Smoker -- 2.00 packs/day for 50 years    Types: Cigarettes  . Smokeless tobacco: Never Used     Comment: cessation info given and reviewed  . Alcohol Use: No  . Drug Use: No  . Sexual Activity: No   Other Topics Concern  . None   Social History Narrative   Additional Social History:    Allergies:   Allergies  Allergen Reactions  . Clarithromycin Nausea And Vomiting and Nausea Only  . Bee Venom     Labs:  Results for orders placed or performed during the hospital encounter of 01/25/16 (from the past 48 hour(s))  Glucose, capillary     Status: Abnormal   Collection Time: 01/30/16 11:51 AM  Result Value Ref Range   Glucose-Capillary 108 (H) 65 - 99 mg/dL  Glucose, capillary     Status: Abnormal   Collection Time: 01/30/16  5:13 PM  Result Value Ref Range   Glucose-Capillary 136 (H) 65 - 99 mg/dL  Glucose, capillary     Status: Abnormal   Collection Time: 01/30/16  9:04 PM  Result Value Ref Range   Glucose-Capillary 179 (H) 65 - 99 mg/dL    Comment 1 Notify RN    Comment 2 Document in Chart   Glucose, capillary     Status: Abnormal   Collection Time: 01/31/16  7:47 AM  Result Value Ref Range   Glucose-Capillary 108 (H) 65 - 99 mg/dL   Comment 1 Notify RN   Glucose, capillary     Status: None   Collection Time: 01/31/16 11:45 AM  Result Value Ref Range   Glucose-Capillary 96 65 - 99 mg/dL   Comment 1 Notify RN   Glucose, capillary     Status: Abnormal   Collection Time: 01/31/16  9:41 PM  Result Value Ref Range   Glucose-Capillary 134 (H) 65 - 99 mg/dL  Creatinine, serum     Status: Abnormal   Collection Time: 02/01/16  4:58 AM  Result Value Ref Range   Creatinine, Ser 1.34 (H) 0.44 - 1.00 mg/dL   GFR calc non Af Amer 39 (L) >60 mL/min   GFR calc Af Amer 45 (L) >60 mL/min    Comment: (NOTE) The eGFR has been calculated using the CKD EPI equation. This calculation has not been validated in all clinical situations. eGFR's persistently <60 mL/min signify possible Chronic Kidney Disease.   Glucose, capillary     Status: Abnormal   Collection Time: 02/01/16  7:48 AM  Result Value Ref Range   Glucose-Capillary 100 (H) 65 - 99 mg/dL    Current Facility-Administered Medications  Medication Dose Route Frequency Provider Last Rate Last Dose  . acetaminophen (TYLENOL) tablet 650 mg  650 mg Oral Q6H PRN Ambrose Finland, MD   650 mg at 02/01/16 0813  . amLODipine (NORVASC) tablet 10 mg  10 mg Oral Daily Thurnell Lose, MD   10 mg at 01/31/16 1023  . antiseptic oral rinse (CPC / CETYLPYRIDINIUM CHLORIDE 0.05%) solution 7 mL  7 mL Mouth Rinse q12n4p [name], MD   7 mL at 01/29/16 1502  . aspirin chewable tablet 81 mg  81 mg Oral Daily Modena Jansky, MD   81 mg at 01/31/16 1030  . chlorhexidine (PERIDEX) 0.12 % solution 15 mL  15 mL Mouth Rinse BID [name], MD   15 mL at  01/30/16 0942  . clonazePAM (KLONOPIN) tablet 1 mg  1 mg Oral BID PRN Modena Jansky, MD   1 mg at 02/01/16 0813  . DULoxetine  (CYMBALTA) DR capsule 40 mg  40 mg Oral Daily Ambrose Finland, MD   40 mg at 01/31/16 1331  . enoxaparin (LOVENOX) injection 30 mg  30 mg Subcutaneous Q24H [name], MD   30 mg at 01/31/16 2150  . gabapentin (NEURONTIN) capsule 100 mg  100 mg Oral QHS Ambrose Finland, MD   100 mg at 01/31/16 2150  . hydrALAZINE (APRESOLINE) injection 10 mg  10 mg Intravenous Q4H PRN Gardiner Barefoot, NP   10 mg at 01/26/16 1607  . nicotine (NICODERM CQ - dosed in mg/24 hours) patch 14 mg  14 mg Transdermal Daily Geradine Girt, DO   14 mg at 01/31/16 1025  . pantoprazole (PROTONIX) EC tablet 40 mg  40 mg Oral BID Modena Jansky, MD   40 mg at 01/31/16 2150  . potassium chloride (K-DUR,KLOR-CON) CR tablet 10 mEq  10 mEq Oral BID Modena Jansky, MD   10 mEq at 01/31/16 2152  . predniSONE (DELTASONE) tablet 5 mg  5 mg Oral QODAY Modena Jansky, MD   5 mg at 01/30/16 0943  . traMADol (ULTRAM) tablet 50 mg  50 mg Oral Q6H PRN Thurnell Lose, MD   50 mg at 02/01/16 0550    Musculoskeletal: Strength & Muscle Tone: decreased Gait & Station: unable to stand Patient leans: N/A  Psychiatric Specialty Exam: Blood pressure 131/65, pulse 68, temperature 97.5 F (36.4 C), temperature source Oral, resp. rate 16, height 5' 4"  (1.626 m), weight 49.941 kg (110 lb 1.6 oz), SpO2 97 %.Body mass index is 18.89 kg/(m^2).  General Appearance: Guarded  Eye Contact::  Good  Speech:  Clear and Coherent  Volume:  Decreased  Mood:  Anxious, Depressed and Irritable  Affect:  Constricted, Depressed and Tearful  Thought Process:  Coherent and Goal Directed  Orientation:  Full (Time, Place, and Person)  Thought Content:  WDL  Suicidal Thoughts:  Yes.  with intent/plan  Homicidal Thoughts:  No  Memory:  Immediate;   Good Recent;   Fair Remote;   Fair  Judgement:  Impaired  Insight:  Fair  Psychomotor Activity:  Decreased  Concentration:  Good  Recall:  Good  Fund of Knowledge:Good  Language:  Good  Akathisia:  Negative  Handed:  Right  AIMS (if indicated):     Assets:  Communication Skills Desire for Improvement Housing Leisure Time Resilience Social Support Transportation  ADL's:  Impaired  Cognition: WNL  Sleep:      Treatment Plan Summary: Daily contact with patient to assess and evaluate symptoms and progress in treatment and Medication management  Monitor for opiate and benzodiazepine withdrawal symptoms Continue Cymbalta 40 mg daily for depression  Continue gabapentin 100 mg at bedtime for mood associated with chronic pain Continue clonazepam 1 mg twice daily as needed for anxiety Refer to the psychiatric social service for geriatric psychiatric placement when medically stable  Disposition: Recommend psychiatric Inpatient admission when medically cleared. Supportive therapy provided about ongoing stressors.  Durward Parcel., MD 02/01/2016 9:31 AM

## 2016-02-02 DIAGNOSIS — T1491 Suicide attempt: Secondary | ICD-10-CM | POA: Diagnosis not present

## 2016-02-02 DIAGNOSIS — T40602D Poisoning by unspecified narcotics, intentional self-harm, subsequent encounter: Secondary | ICD-10-CM

## 2016-02-02 DIAGNOSIS — J449 Chronic obstructive pulmonary disease, unspecified: Secondary | ICD-10-CM | POA: Diagnosis not present

## 2016-02-02 DIAGNOSIS — N179 Acute kidney failure, unspecified: Secondary | ICD-10-CM | POA: Diagnosis not present

## 2016-02-02 DIAGNOSIS — T402X2A Poisoning by other opioids, intentional self-harm, initial encounter: Secondary | ICD-10-CM | POA: Diagnosis not present

## 2016-02-02 LAB — GLUCOSE, CAPILLARY: Glucose-Capillary: 84 mg/dL (ref 65–99)

## 2016-02-02 MED ORDER — ONDANSETRON HCL 8 MG PO TABS: 4.0000 mg | ORAL_TABLET | Freq: Three times a day (TID) | ORAL | Status: DC | PRN

## 2016-02-02 MED ORDER — GABAPENTIN 100 MG PO CAPS
100.0000 mg | ORAL_CAPSULE | Freq: Every day | ORAL | Status: DC
Start: 2016-02-02 — End: 2016-08-29

## 2016-02-02 MED ORDER — DULOXETINE HCL 40 MG PO CPEP: 40.0000 mg | ORAL_CAPSULE | Freq: Every day | ORAL | Status: DC

## 2016-02-02 MED ORDER — ENSURE ENLIVE PO LIQD: 237.0000 mL | Freq: Two times a day (BID) | ORAL | Status: DC

## 2016-02-02 MED ORDER — NICOTINE 14 MG/24HR TD PT24: 14.0000 mg | MEDICATED_PATCH | Freq: Every day | TRANSDERMAL | Status: DC

## 2016-02-02 NOTE — Progress Notes (Signed)
Report called to [facility]. Report called to [place], Therapist, sports. Currently waiting for sheriff deputies to transport pt. [name]

## 2016-02-02 NOTE — Progress Notes (Signed)
Pt discharged from the unit via sheriff. Pt narcotics locked up in pharmacy returned to sheriff for transport. Pt belongings are sent with sheriff. Report called to [facility].  Jacquise Rarick W Areg Bialas, RN

## 2016-02-02 NOTE — Discharge Instructions (Signed)
Follow with Primary MD Thressa Sheller, MD in 7 days   Get CBC, CMP,checked  by Primary MD next visit.    Activity: As tolerated with Full fall precautions use walker/cane & assistance as needed   Disposition inpatient psychiatric   Diet:   Heart Healthy    For Heart failure patients - Check your Weight same time everyday, if you gain over 2 pounds, or you develop in leg swelling, experience more shortness of breath or chest pain, call your Primary MD immediately. Follow Cardiac Low Salt Diet and 1.5 lit/day fluid restriction.   On your next visit with your primary care physician please Get Medicines reviewed and adjusted.   Please request your Prim.MD to go over all Hospital Tests and Procedure/Radiological results at the follow up, please get all Hospital records sent to your Prim MD by signing hospital release before you go home.   If you experience worsening of your admission symptoms, develop shortness of breath, life threatening emergency, suicidal or homicidal thoughts you must seek medical attention immediately by calling 911 or calling your MD immediately  if symptoms less severe.  You Must read complete instructions/literature along with all the possible adverse reactions/side effects for all the Medicines you take and that have been prescribed to you. Take any new Medicines after you have completely understood and accpet all the possible adverse reactions/side effects.   Do not drive, operating heavy machinery, perform activities at heights, swimming or participation in water activities or provide baby sitting services if your were admitted for syncope or siezures until you have seen by Primary MD or a Neurologist and advised to do so again.  Do not drive when taking Pain medications.    Do not take more than prescribed Pain, Sleep and Anxiety Medications  Special Instructions: If you have smoked or chewed Tobacco  in the last 2 yrs please stop smoking, stop any regular  Alcohol  and or any Recreational drug use.  Wear Seat belts while driving.   Please note  You were cared for by a hospitalist during your hospital stay. If you have any questions about your discharge medications or the care you received while you were in the hospital after you are discharged, you can call the unit and asked to speak with the hospitalist on call if the hospitalist that took care of you is not available. Once you are discharged, your primary care physician will handle any further medical issues. Please note that NO REFILLS for any discharge medications will be authorized once you are discharged, as it is imperative that you return to your primary care physician (or establish a relationship with a primary care physician if you do not have one) for your aftercare needs so that they can reassess your need for medications and monitor your lab values.

## 2016-02-02 NOTE — Progress Notes (Signed)
Pt d/c to [facility] ( in pt psych ) today. IVC papers completed and pt was served prior to d/c. D/C Summary, IVC papers were sent to [facility] for review prior to d/c. Sheriff provided pt with transportation to [place] LCSW [phone]

## 2016-02-02 NOTE — Care Management (Signed)
Candace from [facility] ([facility]) called to inform that patient has been accepted for treatment at this facility. Requesting IVC paperwork/recent lab results to be faxed to [phone]  Nurse should call report to [phone] once transportation is on the way for patient.   [place] [facility] main line #[phone]

## 2016-02-02 NOTE — Discharge Summary (Signed)
[name], is a 70 y.o. female  DOB 1946-06-30  MRN [id number].  Admission date:  01/25/2016  Admitting Physician  [name], MD  Discharge Date:  02/02/2016   Primary MD  Thressa Sheller, MD  Recommendations for primary care physician for things to follow:  - Please check CBC, BMP during next visit - management for psychiatric illness as per accepting facility    Admission Diagnosis  AKI (acute kidney injury) ([place]) [N17.9] Opiate overdose, intentional self-harm, initial encounter [facility]) [T40.602A]   Discharge Diagnosis  AKI (acute kidney injury) ([place]) [N17.9] Opiate overdose, intentional self-harm, initial encounter ([place]) [T40.602A]    Principal Problem:   Suicide attempt [facility]) Active Problems:   Spinal stenosis of lumbar region   Diabetes mellitus ([place])   COPD (chronic obstructive pulmonary disease) ([place])   Diabetes mellitus with neurological manifestation ([place])   AKI (acute kidney injury) ([place])   Opiate overdose   Protein-calorie malnutrition, severe      Past Medical History  Diagnosis Date  . Asthma   . HTN (hypertension)   . Grave's disease   . Fibromyalgia   . Thrombophlebitis   . Depression   . Angina   . Shortness of breath   . COPD (chronic obstructive pulmonary disease) ([place])   . Sleep apnea   . Blood dyscrasia     hx of thrombphlebitis  . Recurrent upper respiratory infection (URI)   . GERD (gastroesophageal reflux disease)   . Headache(784.0)   . Arthritis   . CAD (coronary artery disease)   . Chronic kidney disease   . Peripheral vascular disease ([place])   . Diabetes mellitus   . Ulcer   . Diarrhea     chronic   . Thyroid disorder   . Anxiety and depression   . Chronic pain     leg and feet  . DDD (degenerative disc disease)   . Rhinitis   . OP (osteoporosis)   . Vitamin B 12 deficiency   . PUD (peptic ulcer disease)   . DDD (degenerative disc  disease)   . Spinal stenosis   . Spinal stenosis of lumbar region 04/23/2013  . Tobacco use disorder 04/23/2013  . DVT (deep venous thrombosis) ([place])   . Carotid artery occlusion   . Parkinson's disease ([place])   . Dry mouth     Past Surgical History  Procedure Laterality Date  . Gastrectomy      age 22   Part of small intestin and part of stomach  . Cholecystectomy  1999  . Carotid endarterectomy Left Sept. 20,2011    cea  . Left heart catheterization with coronary angiogram N/A 10/02/2011    Procedure: LEFT HEART CATHETERIZATION WITH CORONARY ANGIOGRAM;  Surgeon: Sueanne Margarita, MD;  Location: [facility];  Service: Cardiovascular;  Laterality: N/A;       History of present illness and  Hospital Course:     Kindly see H&P for history of present illness and admission details, please review complete Labs, Consult reports and  Test reports for all details in brief  HPI  from the history and physical done on the day of admission  69 yo female h/o chronic back pain, copd, CAD, CKD, DM amoungst others found by her daughter today after overdosing on her pain pills and trying to write a suicide note. Suspected pt took about 50 of her oxycondone pills. Pt says she took "handfulls". She says she got a phone call yesterday from her pain doctor office who told here they were no longer going to treat her pain. Pt says they gave no reason. On further questioning, she reveals that her roommate has been taking her pain pills and so her pain doctor thinks she is inappropriately using them. Pt got very upset about this. And says she tried to kill herself because "i am tired of being in pain all the time, im miserable". Pt is up and able to hold a conversation at this time. She was given narcan '1mg'$  with EMS per nasal and has not had any further narcan since. Pt referred for admission for opiate overdose with suicidal intent.  Hospital Course  70 year old female patient with history of  asthma/COPD, HTN, GERD, CAD, DM, anxiety, depression, chronic pain, was found by her daughter on 01/25/16 after patient had overdosed on her opioid pain pills and was trying to write a suicide note. It was suspected that she took about 50 of her oxycodone pills. Patient indicated that she received a phone call from her 30 office indicating that they were no longer going to treat her pain and that her roommate had been taking her pain pills which may have led her treating physician to believe that she inappropriately took them. She got upset about this and attempted to kill herself by drug overdose. She received a dose of Narcan via EMS prior to arrival to the ED. She was admitted to stepdown for further management. Mental status changes have resolved. Psychiatric consulted. Suicide precautions in place. Medically stable for discharge to inpatient psych .  Opioid overdose - Done in a suicide attempt on 3/29 afternoon. - Received a dose of Narcan via EMS prior to arrival to the ED. Has not received any Narcan since. - Mental status changes have resolved. Hemodynamically stable.  Suicide attempt/anxiety & depression - Continue suicide precautions and 1:1 sitter. Psychiatry was consulted on 01/26/16. Patient has IVC done by ED. - Psychiatry consultation 3/30 appreciated. There are recommend inpatient admission when medically cleared. Patient is medically stable at this time for discharge to inpatient psychiatry. - Psychiatry has started Cymbalta 30 mg daily for depression and continue clonazepam 1 MG twice a day as needed for anxiety. -had psych re-eval on 4/4-- still need inpatient psych - for inpatient psych admission.  Essential hypertension - Continue amlodipine.  Asthma/COPD - Stable  DM 2 - Controlled.  Acute kidney injury - resolved   Chronic pain/lumbar spinal stenosis/fibromyalgia - Continue gabapentin  Anemia - Stable.  Discharge Condition:  stable   Follow  UP  Follow-up Information    Follow up with Thressa Sheller, MD. Schedule an appointment as soon as possible for a visit in 1 week.   Specialty:  Internal Medicine   Contact information:   [place], [place] [address] [phone]         Discharge Instructions  and  Discharge Medications     Discharge Instructions    Diet - low sodium heart healthy    Complete by:  As directed  Diet - low sodium heart healthy    Complete by:  As directed      Discharge instructions    Complete by:  As directed   Follow with Primary MD Thressa Sheller, MD in 7 days   Get CBC, CMP, 2 view Chest X ray checked  by Primary MD next visit.    Activity: As tolerated with Full fall precautions use walker/cane & assistance as needed   Disposition [facility]   Diet:   Heart Healthy    For Heart failure patients - Check your Weight same time everyday, if you gain over 2 pounds, or you develop in leg swelling, experience more shortness of breath or chest pain, call your Primary MD immediately. Follow Cardiac Low Salt Diet and 1.5 lit/day fluid restriction.   On your next visit with your primary care physician please Get Medicines reviewed and adjusted.   Please request your Prim.MD to go over all Hospital Tests and Procedure/Radiological results at the follow up, please get all Hospital records sent to your Prim MD by signing hospital release before you go home.   If you experience worsening of your admission symptoms, develop shortness of breath, life threatening emergency, suicidal or homicidal thoughts you must seek medical attention immediately by calling 911 or calling your MD immediately  if symptoms less severe.  You Must read complete instructions/literature along with all the possible adverse reactions/side effects for all the Medicines you take and that have been prescribed to you. Take any new Medicines after you have completely understood and accpet all the  possible adverse reactions/side effects.   Do not drive, operating heavy machinery, perform activities at heights, swimming or participation in water activities or provide baby sitting services if your were admitted for syncope or siezures until you have seen by Primary MD or a Neurologist and advised to do so again.  Do not drive when taking Pain medications.    Do not take more than prescribed Pain, Sleep and Anxiety Medications  Special Instructions: If you have smoked or chewed Tobacco  in the last 2 yrs please stop smoking, stop any regular Alcohol  and or any Recreational drug use.  Wear Seat belts while driving.   Please note  You were cared for by a hospitalist during your hospital stay. If you have any questions about your discharge medications or the care you received while you were in the hospital after you are discharged, you can call the unit and asked to speak with the hospitalist on call if the hospitalist that took care of you is not available. Once you are discharged, your primary care physician will handle any further medical issues. Please note that NO REFILLS for any discharge medications will be authorized once you are discharged, as it is imperative that you return to your primary care physician (or establish a relationship with a primary care physician if you do not have one) for your aftercare needs so that they can reassess your need for medications and monitor your lab values.     Discharge instructions    Complete by:  As directed   Follow with Primary MD Thressa Sheller, MD in 7 days   Get CBC, CMP,checked  by Primary MD next visit.    Activity: As tolerated with Full fall precautions use walker/cane & assistance as needed   Disposition inpatient psychiatric   Diet:   Heart Healthy    For Heart failure patients - Check your Weight same time everyday, if you gain over 2  pounds, or you develop in leg swelling, experience more shortness of breath or chest  pain, call your Primary MD immediately. Follow Cardiac Low Salt Diet and 1.5 lit/day fluid restriction.   On your next visit with your primary care physician please Get Medicines reviewed and adjusted.   Please request your Prim.MD to go over all Hospital Tests and Procedure/Radiological results at the follow up, please get all Hospital records sent to your Prim MD by signing hospital release before you go home.   If you experience worsening of your admission symptoms, develop shortness of breath, life threatening emergency, suicidal or homicidal thoughts you must seek medical attention immediately by calling 911 or calling your MD immediately  if symptoms less severe.  You Must read complete instructions/literature along with all the possible adverse reactions/side effects for all the Medicines you take and that have been prescribed to you. Take any new Medicines after you have completely understood and accpet all the possible adverse reactions/side effects.   Do not drive, operating heavy machinery, perform activities at heights, swimming or participation in water activities or provide baby sitting services if your were admitted for syncope or siezures until you have seen by Primary MD or a Neurologist and advised to do so again.  Do not drive when taking Pain medications.    Do not take more than prescribed Pain, Sleep and Anxiety Medications  Special Instructions: If you have smoked or chewed Tobacco  in the last 2 yrs please stop smoking, stop any regular Alcohol  and or any Recreational drug use.  Wear Seat belts while driving.   Please note  You were cared for by a hospitalist during your hospital stay. If you have any questions about your discharge medications or the care you received while you were in the hospital after you are discharged, you can call the unit and asked to speak with the hospitalist on call if the hospitalist that took care of you is not available. Once you are  discharged, your primary care physician will handle any further medical issues. Please note that NO REFILLS for any discharge medications will be authorized once you are discharged, as it is imperative that you return to your primary care physician (or establish a relationship with a primary care physician if you do not have one) for your aftercare needs so that they can reassess your need for medications and monitor your lab values.     Increase activity slowly    Complete by:  As directed      Increase activity slowly    Complete by:  As directed             Medication List    STOP taking these medications        diphenhydrAMINE 25 MG tablet  Commonly known as:  BENADRYL     metoCLOPramide 5 MG tablet  Commonly known as:  REGLAN     Oxycodone HCl 10 MG Tabs     promethazine 25 MG tablet  Commonly known as:  PHENERGAN      TAKE these medications        acetaminophen 500 MG tablet  Commonly known as:  TYLENOL  Take 500 mg by mouth every 6 (six) hours as needed for moderate pain or headache.     albuterol-ipratropium 18-103 MCG/ACT inhaler  Commonly known as:  COMBIVENT  Inhale 2 puffs into the lungs every 6 (six) hours as needed for shortness of breath (sob). For shortness of breath  amLODipine 10 MG tablet  Commonly known as:  NORVASC  Take 1 tablet (10 mg total) by mouth daily.     aspirin 81 MG tablet  Take 81 mg by mouth daily.     calcium acetate 667 MG capsule  Commonly known as:  PHOSLO  Take 667 mg by mouth 2 (two) times daily.     clonazePAM 1 MG tablet  Commonly known as:  KLONOPIN  Take 1 mg by mouth 2 (two) times daily as needed for anxiety (anxiety). For anxiety     DULoxetine HCl 40 MG Cpep  Take 40 mg by mouth daily.     feeding supplement (ENSURE ENLIVE) Liqd  Take 237 mLs by mouth 2 (two) times daily between meals.     gabapentin 100 MG capsule  Commonly known as:  NEURONTIN  Take 1 capsule (100 mg total) by mouth at bedtime.      nicotine 14 mg/24hr patch  Commonly known as:  NICODERM CQ - dosed in mg/24 hours  Place 1 patch (14 mg total) onto the skin daily.     nitroGLYCERIN 0.4 MG SL tablet  Commonly known as:  NITROSTAT  Place 0.4 mg under the tongue every 5 (five) minutes as needed. For chest pain     ondansetron 8 MG tablet  Commonly known as:  ZOFRAN  Take 0.5 tablets (4 mg total) by mouth every 8 (eight) hours as needed for nausea or vomiting.     pantoprazole 40 MG tablet  Commonly known as:  PROTONIX  Take 40 mg by mouth 2 (two) times daily.     potassium chloride SA 20 MEQ tablet  Commonly known as:  K-DUR,KLOR-CON  Take 10 mEq by mouth 2 (two) times daily.     predniSONE 5 MG tablet  Commonly known as:  DELTASONE  Take 5 mg by mouth every other day.     PRENATAL PO  Take 1 tablet by mouth daily.     traMADol 50 MG tablet  Commonly known as:  ULTRAM  Take 1 tablet (50 mg total) by mouth every 6 (six) hours as needed for moderate pain.          Diet and Activity recommendation: See Discharge Instructions above   Consults obtained -  Psychiatry  Major procedures and Radiology Reports - PLEASE review detailed and final reports for all details, in brief -      No results found.  Micro Results    Recent Results (from the past 240 hour(s))  MRSA PCR Screening     Status: None   Collection Time: 01/25/16  8:23 PM  Result Value Ref Range Status   MRSA by PCR NEGATIVE NEGATIVE Final    Comment:        The GeneXpert MRSA Assay (FDA approved for NASAL specimens only), is one component of a comprehensive MRSA colonization surveillance program. It is not intended to diagnose MRSA infection nor to guide or monitor treatment for MRSA infections.        Today   Subjective:   [name] today has no headache,no chest abdominal pain,no new weakness tingling or numbness,  Objective:   Blood pressure 136/54, pulse 72, temperature 98 F (36.7 C), temperature source  Oral, resp. rate 16, height '5\' 4"'$  (1.626 m), weight 49.941 kg (110 lb 1.6 oz), SpO2 96 %.   Intake/Output Summary (Last 24 hours) at 02/02/16 1042 Last data filed at 02/01/16 1842  Gross per 24 hour  Intake  840 ml  Output      0 ml  Net    840 ml    Exam Awake Alert, Oriented x 3,  [place].AT,PERRAL Supple Neck,No JVD, No cervical lymphadenopathy appriciated.  Symmetrical Chest wall movement, Good air movement bilaterally, CTAB RRR,No Gallops,Rubs or new Murmurs, No Parasternal Heave +ve B.Sounds, Abd Soft, Non tender, No organomegaly appriciated, No rebound -guarding or rigidity. No Cyanosis, Clubbing or edema, No new Rash or bruise  Data Review   CBC w Diff: Lab Results  Component Value Date   WBC 5.2 01/27/2016   HGB 10.8* 01/27/2016   HCT 32.2* 01/27/2016   PLT 268 01/27/2016   LYMPHOPCT 12 01/25/2016   MONOPCT 11 01/25/2016   EOSPCT 0 01/25/2016   BASOPCT 0 01/25/2016    CMP: Lab Results  Component Value Date   NA 139 01/27/2016   K 4.2 01/27/2016   CL 113* 01/27/2016   CO2 18* 01/27/2016   BUN 16 01/27/2016   CREATININE 1.34* 02/01/2016   PROT 5.8* 01/25/2016   ALBUMIN 2.9* 01/25/2016   BILITOT 0.3 01/25/2016   ALKPHOS 95 01/25/2016   AST 16 01/25/2016   ALT 19 01/25/2016  .   Total Time in preparing paper work, data evaluation and todays exam - 30 minutes  Kaevon Cotta M.D on 02/02/2016 at 10:42 AM  [facility]   Office  [phone]

## 2016-02-15 ENCOUNTER — Encounter: Payer: Self-pay | Admitting: Vascular Surgery

## 2016-02-22 ENCOUNTER — Ambulatory Visit (INDEPENDENT_AMBULATORY_CARE_PROVIDER_SITE_OTHER): Payer: Medicare Other | Admitting: Vascular Surgery

## 2016-02-22 ENCOUNTER — Ambulatory Visit (HOSPITAL_COMMUNITY)
Admission: RE | Admit: 2016-02-22 | Discharge: 2016-02-22 | Disposition: A | Discharge status: Home / Self Care | Payer: Medicare Other | Source: Ambulatory Visit | Attending: Vascular Surgery | Admitting: Vascular Surgery

## 2016-02-22 ENCOUNTER — Encounter: Payer: Self-pay | Admitting: Vascular Surgery

## 2016-02-22 VITALS — BP 140/75 | HR 85 | Temp 97.0°F | Resp 18 | Ht 64.0 in | Wt 108.0 lb

## 2016-02-22 DIAGNOSIS — E1122 Type 2 diabetes mellitus with diabetic chronic kidney disease: Secondary | ICD-10-CM | POA: Insufficient documentation

## 2016-02-22 DIAGNOSIS — Z720 Tobacco use: Secondary | ICD-10-CM | POA: Diagnosis not present

## 2016-02-22 DIAGNOSIS — K219 Gastro-esophageal reflux disease without esophagitis: Secondary | ICD-10-CM | POA: Diagnosis not present

## 2016-02-22 DIAGNOSIS — I129 Hypertensive chronic kidney disease with stage 1 through stage 4 chronic kidney disease, or unspecified chronic kidney disease: Secondary | ICD-10-CM | POA: Diagnosis not present

## 2016-02-22 DIAGNOSIS — N189 Chronic kidney disease, unspecified: Secondary | ICD-10-CM | POA: Insufficient documentation

## 2016-02-22 DIAGNOSIS — I6523 Occlusion and stenosis of bilateral carotid arteries: Secondary | ICD-10-CM | POA: Diagnosis present

## 2016-02-22 NOTE — Progress Notes (Signed)
[facility] [place]  Patient name: [name] MRN: [id number] DOB: December 02, 1945 Sex: female  REASON FOR VISIT: Follow up of carotid disease.  HPI: [name] is a 70 y.o. female who I last saw on 07/06/2015. The patient underwent a left carotid endarterectomy in September 2011. I have been following a moderate right carotid stenosis. At the time of her last visit, she had a 60-79% right carotid stenosis which was asymptomatic. I recommended a follow up study in 6 months.  Since I saw her last, she denies any history of stroke, TIAs, expressive or receptive aphasia, or amaurosis fugax. She is on aspirin and is on a statin.  Past Medical History  Diagnosis Date  . Asthma   . HTN (hypertension)   . Grave's disease   . Fibromyalgia   . Thrombophlebitis   . Depression   . Angina   . Shortness of breath   . COPD (chronic obstructive pulmonary disease) ([place])   . Sleep apnea   . Blood dyscrasia     hx of thrombphlebitis  . Recurrent upper respiratory infection (URI)   . GERD (gastroesophageal reflux disease)   . Headache(784.0)   . Arthritis   . CAD (coronary artery disease)   . Chronic kidney disease   . Peripheral vascular disease ([place])   . Diabetes mellitus   . Ulcer   . Diarrhea     chronic   . Thyroid disorder   . Anxiety and depression   . Chronic pain     leg and feet  . DDD (degenerative disc disease)   . Rhinitis   . OP (osteoporosis)   . Vitamin B 12 deficiency   . PUD (peptic ulcer disease)   . DDD (degenerative disc disease)   . Spinal stenosis   . Spinal stenosis of lumbar region 04/23/2013  . Tobacco use disorder 04/23/2013  . DVT (deep venous thrombosis) ([place])   . Carotid artery occlusion   . Parkinson's disease ([place])   . Dry mouth     Family History  Problem Relation Age of Onset  . Diabetes Mother   . Hyperlipidemia Mother   . Heart attack Mother   . Other Mother     varicose veins,respiratory,stroke  . Heart disease  Mother     before age 64  . Hypertension Mother   . Varicose Veins Mother   . Diabetes Father   . Heart disease Father     before age 25  . Hyperlipidemia Father   . Heart attack Father   . Other Father     varicose veins  . Hypertension Father   . Varicose Veins Father   . Diabetes Sister   . Heart disease Sister     before age 38  . Hyperlipidemia Sister   . Heart attack Sister   . Other Sister     varicose veins  . Hypertension Sister   . Varicose Veins Sister   . Peripheral vascular disease Sister   . Diabetes Sister   . Hyperlipidemia Sister   . Hypertension Sister   . Varicose Veins Sister     SOCIAL HISTORY: Social History  Substance Use Topics  . Smoking status: Current Every Day Smoker -- 2.00 packs/day for 50 years    Types: Cigarettes  . Smokeless tobacco: Never Used     Comment: cessation info given and reviewed  . Alcohol Use: No    Allergies  Allergen Reactions  . Clarithromycin Nausea And  Vomiting and Nausea Only    Current Outpatient Prescriptions  Medication Sig Dispense Refill  . acetaminophen (TYLENOL) 500 MG tablet Take 500 mg by mouth every 6 (six) hours as needed for moderate pain or headache.    . albuterol-ipratropium (COMBIVENT) 18-103 MCG/ACT inhaler Inhale 2 puffs into the lungs every 6 (six) hours as needed for shortness of breath (sob). For shortness of breath    . amLODipine (NORVASC) 10 MG tablet Take 1 tablet (10 mg total) by mouth daily.    [name] aspirin 81 MG tablet Take 81 mg by mouth daily.      . calcium acetate (PHOSLO) 667 MG capsule Take 667 mg by mouth 2 (two) times daily.     . clonazePAM (KLONOPIN) 1 MG tablet Take 1 mg by mouth 2 (two) times daily as needed for anxiety (anxiety). For anxiety    . DULoxetine 40 MG CPEP Take 40 mg by mouth daily.  3  . nicotine (NICODERM CQ - DOSED IN MG/24 HOURS) 14 mg/24hr patch Place 1 patch (14 mg total) onto the skin daily. 28 patch 0  . nitroGLYCERIN (NITROSTAT) 0.4 MG SL tablet Place  0.4 mg under the tongue every 5 (five) minutes as needed. For chest pain     . ondansetron (ZOFRAN) 8 MG tablet Take 0.5 tablets (4 mg total) by mouth every 8 (eight) hours as needed for nausea or vomiting. 20 tablet 0  . pantoprazole (PROTONIX) 40 MG tablet Take 40 mg by mouth 2 (two) times daily.     . potassium chloride SA (K-DUR,KLOR-CON) 20 MEQ tablet Take 10 mEq by mouth 2 (two) times daily.   1  . predniSONE (DELTASONE) 5 MG tablet Take 5 mg by mouth every other day.     . Prenatal Vit-Fe Fumarate-FA (PRENATAL PO) Take 1 tablet by mouth daily.    . feeding supplement, ENSURE ENLIVE, (ENSURE ENLIVE) LIQD Take 237 mLs by mouth 2 (two) times daily between meals. (Patient not taking: Reported on 02/22/2016) 237 mL 12  . gabapentin (NEURONTIN) 100 MG capsule Take 1 capsule (100 mg total) by mouth at bedtime. (Patient not taking: Reported on 02/22/2016)    . HYDROcodone-acetaminophen (NORCO/VICODIN) 5-325 MG tablet Take 1 tablet by mouth every 6 (six) hours as needed.  0  . traMADol (ULTRAM) 50 MG tablet Take 1 tablet (50 mg total) by mouth every 6 (six) hours as needed for moderate pain. (Patient not taking: Reported on 02/22/2016) 30 tablet    No current facility-administered medications for this visit.    REVIEW OF SYSTEMS:  '[X]'$  denotes positive finding, '[ ]'$  denotes negative finding Cardiac  Comments:  Chest pain or chest pressure:    Shortness of breath upon exertion: X   Short of breath when lying flat:    Irregular heart rhythm:        Vascular    Pain in calf, thigh, or hip brought on by ambulation: X   Pain in feet at night that wakes you up from your sleep:     Blood clot in your veins:    Leg swelling:  X       Pulmonary    Oxygen at home:    Productive cough:  X   Wheezing:         Neurologic    Sudden weakness in arms or legs:     Sudden numbness in arms or legs:     Sudden onset of difficulty speaking or slurred speech:    Temporary  loss of vision in one eye:       Problems with dizziness:         Gastrointestinal    Blood in stool:     Vomited blood:         Genitourinary    Burning when urinating:     Blood in urine:        Psychiatric    Major depression:  X       Hematologic    Bleeding problems:    Problems with blood clotting too easily:        Skin    Rashes or ulcers:        Constitutional    Fever or chills:      PHYSICAL EXAM: Filed Vitals:   02/22/16 1146 02/22/16 1148  BP: 140/82 140/75  Pulse: 97 85  Temp: 97 F (36.1 C)   TempSrc: Oral   Resp: 18   Height: '5\' 4"'$  (1.626 m)   Weight: 108 lb (48.988 kg)   SpO2: 96%     GENERAL: The patient is a well-nourished female, in no acute distress. The vital signs are documented above. CARDIAC: There is a regular rate and rhythm.  VASCULAR: I do not detect carotid bruits. PULMONARY: There is good air exchange bilaterally without wheezing or rales. ABDOMEN: Soft and non-tender with normal pitched bowel sounds.  MUSCULOSKELETAL: There are no major deformities or cyanosis. NEUROLOGIC: No focal weakness or paresthesias are detected. SKIN: There are no ulcers or rashes noted. PSYCHIATRIC: The patient has a normal affect.  DATA:   CAROTID DUPLEX: I have independently interpreted her carotid duplex scan today. On the right side there is a 60-79% stenosis. The left carotid endarterectomy site is widely patent. Both vertebral arteries are patent with antegrade flow.  MEDICAL ISSUES:  ASYMPTOMATIC 60-79% RIGHT CAROTID STENOSIS: The patient has an asymptomatic 60-79% right carotid stenosis. I explained that we would not consider right carotid endarterectomy was the stenosis progressed to greater than 80% or she developed new right hemispheric symptoms. Her left carotid endarterectomy site is widely patent. She is on aspirin and is on a statin. She does continue to smoke. We have discussed the importance of tobacco cessation.   Deitra Mayo [facility] [place]: [phone]

## 2016-03-27 NOTE — Addendum Note (Signed)
Addended by: Mena Goes on: 03/27/2016 04:47 PM   Modules accepted: Orders

## 2016-08-08 DIAGNOSIS — I129 Hypertensive chronic kidney disease with stage 1 through stage 4 chronic kidney disease, or unspecified chronic kidney disease: Secondary | ICD-10-CM | POA: Diagnosis not present

## 2016-08-08 DIAGNOSIS — M810 Age-related osteoporosis without current pathological fracture: Secondary | ICD-10-CM | POA: Diagnosis not present

## 2016-08-08 DIAGNOSIS — E538 Deficiency of other specified B group vitamins: Secondary | ICD-10-CM | POA: Diagnosis not present

## 2016-08-08 DIAGNOSIS — E785 Hyperlipidemia, unspecified: Secondary | ICD-10-CM | POA: Diagnosis not present

## 2016-08-08 DIAGNOSIS — E1122 Type 2 diabetes mellitus with diabetic chronic kidney disease: Secondary | ICD-10-CM | POA: Diagnosis not present

## 2016-08-10 DIAGNOSIS — Z79891 Long term (current) use of opiate analgesic: Secondary | ICD-10-CM | POA: Diagnosis not present

## 2016-08-10 DIAGNOSIS — M79662 Pain in left lower leg: Secondary | ICD-10-CM | POA: Diagnosis not present

## 2016-08-10 DIAGNOSIS — M79605 Pain in left leg: Secondary | ICD-10-CM | POA: Diagnosis not present

## 2016-08-10 DIAGNOSIS — M25552 Pain in left hip: Secondary | ICD-10-CM | POA: Diagnosis not present

## 2016-08-10 DIAGNOSIS — G894 Chronic pain syndrome: Secondary | ICD-10-CM | POA: Diagnosis not present

## 2016-08-10 DIAGNOSIS — M545 Low back pain: Secondary | ICD-10-CM | POA: Diagnosis not present

## 2016-08-15 DIAGNOSIS — Z23 Encounter for immunization: Secondary | ICD-10-CM | POA: Diagnosis not present

## 2016-08-15 DIAGNOSIS — J449 Chronic obstructive pulmonary disease, unspecified: Secondary | ICD-10-CM | POA: Diagnosis not present

## 2016-08-15 DIAGNOSIS — E1122 Type 2 diabetes mellitus with diabetic chronic kidney disease: Secondary | ICD-10-CM | POA: Diagnosis not present

## 2016-08-15 DIAGNOSIS — I5032 Chronic diastolic (congestive) heart failure: Secondary | ICD-10-CM | POA: Diagnosis not present

## 2016-08-15 DIAGNOSIS — I129 Hypertensive chronic kidney disease with stage 1 through stage 4 chronic kidney disease, or unspecified chronic kidney disease: Secondary | ICD-10-CM | POA: Diagnosis not present

## 2016-08-23 ENCOUNTER — Encounter: Payer: Self-pay | Admitting: Vascular Surgery

## 2016-08-29 ENCOUNTER — Encounter: Payer: Self-pay | Admitting: Family

## 2016-08-29 ENCOUNTER — Ambulatory Visit (INDEPENDENT_AMBULATORY_CARE_PROVIDER_SITE_OTHER): Payer: Medicare Other | Admitting: Family

## 2016-08-29 ENCOUNTER — Ambulatory Visit (HOSPITAL_COMMUNITY)
Admission: RE | Admit: 2016-08-29 | Discharge: 2016-08-29 | Disposition: A | Discharge status: Home / Self Care | Payer: Medicare Other | Source: Ambulatory Visit | Attending: Family | Admitting: Family

## 2016-08-29 VITALS — BP 126/61 | HR 78 | Temp 98.4°F | Ht 64.0 in | Wt 97.0 lb

## 2016-08-29 DIAGNOSIS — Z9889 Other specified postprocedural states: Secondary | ICD-10-CM

## 2016-08-29 DIAGNOSIS — F17200 Nicotine dependence, unspecified, uncomplicated: Secondary | ICD-10-CM

## 2016-08-29 DIAGNOSIS — I6523 Occlusion and stenosis of bilateral carotid arteries: Secondary | ICD-10-CM

## 2016-08-29 DIAGNOSIS — I6521 Occlusion and stenosis of right carotid artery: Secondary | ICD-10-CM | POA: Insufficient documentation

## 2016-08-29 LAB — VAS US CAROTID
LEFT ECA DIAS: -7 cm/s
LEFT VERTEBRAL DIAS: -15 cm/s
Left CCA dist dias: 20 cm/s
Left CCA dist sys: 115 cm/s
Left CCA prox dias: 17 cm/s
Left CCA prox sys: 69 cm/s
Left ICA dist dias: -39 cm/s
Left ICA dist sys: -111 cm/s
Left ICA prox dias: -19 cm/s
Left ICA prox sys: -54 cm/s
RIGHT CCA MID DIAS: 17 cm/s
RIGHT ECA DIAS: -61 cm/s
RIGHT VERTEBRAL DIAS: -14 cm/s
Right CCA prox dias: 15 cm/s
Right CCA prox sys: 84 cm/s
Right cca dist sys: -106 cm/s

## 2016-08-29 NOTE — Patient Instructions (Signed)
Stroke Prevention Some medical conditions and behaviors are associated with an increased chance of having a stroke. You may prevent a stroke by making healthy choices and managing medical conditions. HOW CAN I REDUCE MY RISK OF HAVING A STROKE?   Stay physically active. Get at least 30 minutes of activity on most or all days.  Do not smoke. It may also be helpful to avoid exposure to secondhand smoke.  Limit alcohol use. Moderate alcohol use is considered to be:  No more than 2 drinks per day for men.  No more than 1 drink per day for nonpregnant women.  Eat healthy foods. This involves:  Eating 5 or more servings of fruits and vegetables a day.  Making dietary changes that address high blood pressure (hypertension), high cholesterol, diabetes, or obesity.  Manage your cholesterol levels.  Making food choices that are high in fiber and low in saturated fat, trans fat, and cholesterol may control cholesterol levels.  Take any prescribed medicines to control cholesterol as directed by your health care provider.  Manage your diabetes.  Controlling your carbohydrate and sugar intake is recommended to manage diabetes.  Take any prescribed medicines to control diabetes as directed by your health care provider.  Control your hypertension.  Making food choices that are low in salt (sodium), saturated fat, trans fat, and cholesterol is recommended to manage hypertension.  Ask your health care provider if you need treatment to lower your blood pressure. Take any prescribed medicines to control hypertension as directed by your health care provider.  If you are 18-39 years of age, have your blood pressure checked every 3-5 years. If you are 40 years of age or older, have your blood pressure checked every year.  Maintain a healthy weight.  Reducing calorie intake and making food choices that are low in sodium, saturated fat, trans fat, and cholesterol are recommended to manage  weight.  Stop drug abuse.  Avoid taking birth control pills.  Talk to your health care provider about the risks of taking birth control pills if you are over 35 years old, smoke, get migraines, or have ever had a blood clot.  Get evaluated for sleep disorders (sleep apnea).  Talk to your health care provider about getting a sleep evaluation if you snore a lot or have excessive sleepiness.  Take medicines only as directed by your health care provider.  For some people, aspirin or blood thinners (anticoagulants) are helpful in reducing the risk of forming abnormal blood clots that can lead to stroke. If you have the irregular heart rhythm of atrial fibrillation, you should be on a blood thinner unless there is a good reason you cannot take them.  Understand all your medicine instructions.  Make sure that other conditions (such as anemia or atherosclerosis) are addressed. SEEK IMMEDIATE MEDICAL CARE IF:   You have sudden weakness or numbness of the face, arm, or leg, especially on one side of the body.  Your face or eyelid droops to one side.  You have sudden confusion.  You have trouble speaking (aphasia) or understanding.  You have sudden trouble seeing in one or both eyes.  You have sudden trouble walking.  You have dizziness.  You have a loss of balance or coordination.  You have a sudden, severe headache with no known cause.  You have new chest pain or an irregular heartbeat. Any of these symptoms may represent a serious problem that is an emergency. Do not wait to see if the symptoms will   go away. Get medical help at once. Call your local emergency services (911 in [place] Do not drive yourself to the hospital.   This information is not intended to replace advice given to you by your health care provider. Make sure you discuss any questions you have with your health care provider.   Document Released: 11/22/2004 Document Revised: 11/05/2014 Document Reviewed:  04/17/2013 Elsevier Interactive Patient Education 2016 Elsevier Inc[employer]     Steps to Quit Smoking  Smoking tobacco can be harmful to your health and can affect almost every organ in your body. Smoking puts you, and those around you, at risk for developing many serious chronic diseases. Quitting smoking is difficult, but it is one of the best things that you can do for your health. It is never too late to quit. WHAT ARE THE BENEFITS OF QUITTING SMOKING? When you quit smoking, you lower your risk of developing serious diseases and conditions, such as:  Lung cancer or lung disease, such as COPD.  Heart disease.  Stroke.  Heart attack.  Infertility.  Osteoporosis and bone fractures. Additionally, symptoms such as coughing, wheezing, and shortness of breath may get better when you quit. You may also find that you get sick less often because your body is stronger at fighting off colds and infections. If you are pregnant, quitting smoking can help to reduce your chances of having a baby of low birth weight. HOW DO I GET READY TO QUIT? When you decide to quit smoking, create a plan to make sure that you are successful. Before you quit:  Pick a date to quit. Set a date within the next two weeks to give you time to prepare.  Write down the reasons why you are quitting. Keep this list in places where you will see it often, such as on your bathroom mirror or in your car or wallet.  Identify the people, places, things, and activities that make you want to smoke (triggers) and avoid them. Make sure to take these actions:  Throw away all cigarettes at home, at work, and in your car.  Throw away smoking accessories, such as ashtrays and lighters.  Clean your car and make sure to empty the ashtray.  Clean your home, including curtains and carpets.  Tell your family, friends, and coworkers that you are quitting. Support from your loved ones can make quitting easier.  Talk with your health care  provider about your options for quitting smoking.  Find out what treatment options are covered by your health insurance. WHAT STRATEGIES CAN I USE TO QUIT SMOKING?  Talk with your healthcare provider about different strategies to quit smoking. Some strategies include:  Quitting smoking altogether instead of gradually lessening how much you smoke over a period of time. Research shows that quitting "cold turkey" is more successful than gradually quitting.  Attending in-person counseling to help you build problem-solving skills. You are more likely to have success in quitting if you attend several counseling sessions. Even short sessions of 10 minutes can be effective.  Finding resources and support systems that can help you to quit smoking and remain smoke-free after you quit. These resources are most helpful when you use them often. They can include:  Online chats with a counselor.  Telephone quitlines.  Printed self-help materials.  Support groups or group counseling.  Text messaging programs.  Mobile phone applications.  Taking medicines to help you quit smoking. (If you are pregnant or breastfeeding, talk with your health care provider first.) Some   medicines contain nicotine and some do not. Both types of medicines help with cravings, but the medicines that include nicotine help to relieve withdrawal symptoms. Your health care provider may recommend:  Nicotine patches, gum, or lozenges.  Nicotine inhalers or sprays.  Non-nicotine medicine that is taken by mouth. Talk with your health care provider about combining strategies, such as taking medicines while you are also receiving in-person counseling. Using these two strategies together makes you more likely to succeed in quitting than if you used either strategy on its own. If you are pregnant or breastfeeding, talk with your health care provider about finding counseling or other support strategies to quit smoking. Do not take  medicine to help you quit smoking unless told to do so by your health care provider. WHAT THINGS CAN I DO TO MAKE IT EASIER TO QUIT? Quitting smoking might feel overwhelming at first, but there is a lot that you can do to make it easier. Take these important actions:  Reach out to your family and friends and ask that they support and encourage you during this time. Call telephone quitlines, reach out to support groups, or work with a counselor for support.  Ask people who smoke to avoid smoking around you.  Avoid places that trigger you to smoke, such as bars, parties, or smoke-break areas at work.  Spend time around people who do not smoke.  Lessen stress in your life, because stress can be a smoking trigger for some people. To lessen stress, try:  Exercising regularly.  Deep-breathing exercises.  Yoga.  Meditating.  Performing a body scan. This involves closing your eyes, scanning your body from head to toe, and noticing which parts of your body are particularly tense. Purposefully relax the muscles in those areas.  Download or purchase mobile phone or tablet apps (applications) that can help you stick to your quit plan by providing reminders, tips, and encouragement. There are many free apps, such as QuitGuide from the CDC (Centers for Disease Control and Prevention). You can find other support for quitting smoking (smoking cessation) through smokefree.gov and other websites. HOW WILL I FEEL WHEN I QUIT SMOKING? Within the first 24 hours of quitting smoking, you may start to feel some withdrawal symptoms. These symptoms are usually most noticeable 2-3 days after quitting, but they usually do not last beyond 2-3 weeks. Changes or symptoms that you might experience include:  Mood swings.  Restlessness, anxiety, or irritation.  Difficulty concentrating.  Dizziness.  Strong cravings for sugary foods in addition to nicotine.  Mild weight  gain.  Constipation.  Nausea.  Coughing or a sore throat.  Changes in how your medicines work in your body.  A depressed mood.  Difficulty sleeping (insomnia). After the first 2-3 weeks of quitting, you may start to notice more positive results, such as:  Improved sense of smell and taste.  Decreased coughing and sore throat.  Slower heart rate.  Lower blood pressure.  Clearer skin.  The ability to breathe more easily.  Fewer sick days. Quitting smoking is very challenging for most people. Do not get discouraged if you are not successful the first time. Some people need to make many attempts to quit before they achieve long-term success. Do your best to stick to your quit plan, and talk with your health care provider if you have any questions or concerns.   This information is not intended to replace advice given to you by your health care provider. Make sure you discuss any questions   you have with your health care provider.   Document Released: 10/09/2001 Document Revised: 03/01/2015 Document Reviewed: 03/01/2015 Elsevier Interactive Patient Education 2016 [employer] Inc.  

## 2016-08-29 NOTE — Progress Notes (Signed)
Chief Complaint: Follow up Extracranial Carotid Artery Stenosis   History of Present Illness  [name] is a 70 y.o. female patient of Dr. Scot Dock who underwent a left carotid endarterectomy in September of 2011.  She returns today for routine surveillance.   She denies any history of TIA or stroke symptoms. Specifically she denies a history of amaurosis fugax or monocular blindness, unilateral facial drooping, hemiplegia, or receptive or expressive aphasia.   She has a history of partial gastrectomy for gastric ulcer.  Pt has L4-5 "bad discs", and stenosis, states chronic low back pain with intermittent radiculopathy in left leg.    She reports that she was diagnosed with drug induced Parkinson's Disease.  Pt reports neuropathy in feet, painful to touch, does not know if this is from her back issues.  She leans to the right for a couple of years, had seen a neurologist.  She denies post prandial abdominal pain.  She was hospitalized in March 2017 with intentional opiate overdose. Pt then received inpatient psychiatric care.   Pt Diabetic: No, states she previously possibly had DM, has occasional hypoglycemia  Pt smoker: smoker (1 ppd x 50+ yrs)  Pt meds include:  Statin : no  ASA: Yes  Other anticoagulants/antiplatelets: no   Past Medical History:  Diagnosis Date  . Angina   . Anxiety and depression   . Arthritis   . Asthma   . Blood dyscrasia    hx of thrombphlebitis  . CAD (coronary artery disease)   . Carotid artery occlusion   . Chronic kidney disease   . Chronic pain    leg and feet  . COPD (chronic obstructive pulmonary disease) ([place])   . DDD (degenerative disc disease)   . DDD (degenerative disc disease)   . Depression   . Diabetes mellitus   . Diarrhea    chronic   . Dry mouth   . DVT (deep venous thrombosis) ([place])   . Fibromyalgia   . GERD (gastroesophageal reflux disease)   . Grave's disease   . Headache(784.0)   . HTN  (hypertension)   . OP (osteoporosis)   . Parkinson's disease ([place])   . Peripheral vascular disease ([place])   . PUD (peptic ulcer disease)   . Recurrent upper respiratory infection (URI)   . Rhinitis   . Shortness of breath   . Sleep apnea   . Spinal stenosis   . Spinal stenosis of lumbar region 04/23/2013  . Thrombophlebitis   . Thyroid disorder   . Tobacco use disorder 04/23/2013  . Ulcer ([place])   . Vitamin B 12 deficiency     Social History Social History  Substance Use Topics  . Smoking status: Current Every Day Smoker    Packs/day: 2.00    Years: 50.00    Types: Cigarettes  . Smokeless tobacco: Never Used     Comment: cessation info given and reviewed  . Alcohol use No    Family History Family History  Problem Relation Age of Onset  . Diabetes Mother   . Hyperlipidemia Mother   . Heart attack Mother   . Other Mother     varicose veins,respiratory,stroke  . Heart disease Mother     before age 64  . Hypertension Mother   . Varicose Veins Mother   . Diabetes Father   . Heart disease Father     before age 51  . Hyperlipidemia Father   . Heart attack Father   . Other Father  varicose veins  . Hypertension Father   . Varicose Veins Father   . Diabetes Sister   . Heart disease Sister     before age 19  . Hyperlipidemia Sister   . Heart attack Sister   . Other Sister     varicose veins  . Hypertension Sister   . Varicose Veins Sister   . Peripheral vascular disease Sister   . Diabetes Sister   . Hyperlipidemia Sister   . Hypertension Sister   . Varicose Veins Sister     Surgical History Past Surgical History:  Procedure Laterality Date  . CAROTID ENDARTERECTOMY Left Sept. 20,2011   cea  . CHOLECYSTECTOMY  1999  . GASTRECTOMY     age 69   Part of small intestin and part of stomach  . LEFT HEART CATHETERIZATION WITH CORONARY ANGIOGRAM N/A 10/02/2011   Procedure: LEFT HEART CATHETERIZATION WITH CORONARY ANGIOGRAM;  Surgeon: Sueanne Margarita, MD;   Location: [facility];  Service: Cardiovascular;  Laterality: N/A;    No Active Allergies  Current Outpatient Prescriptions  Medication Sig Dispense Refill  . acetaminophen (TYLENOL) 500 MG tablet Take 500 mg by mouth every 6 (six) hours as needed for moderate pain or headache.    . albuterol-ipratropium (COMBIVENT) 18-103 MCG/ACT inhaler Inhale 2 puffs into the lungs every 6 (six) hours as needed for shortness of breath (sob). For shortness of breath    . amLODipine (NORVASC) 10 MG tablet Take 1 tablet (10 mg total) by mouth daily.    [name] aspirin 81 MG tablet Take 81 mg by mouth daily.      . calcium acetate (PHOSLO) 667 MG capsule Take 667 mg by mouth 2 (two) times daily.     . clonazePAM (KLONOPIN) 1 MG tablet Take 1 mg by mouth 2 (two) times daily as needed for anxiety (anxiety). For anxiety    . DULoxetine 40 MG CPEP Take 40 mg by mouth daily.  3  . ondansetron (ZOFRAN) 8 MG tablet Take 0.5 tablets (4 mg total) by mouth every 8 (eight) hours as needed for nausea or vomiting. 20 tablet 0  . oxyCODONE-acetaminophen (PERCOCET/ROXICET) 5-325 MG tablet Take 1 tablet by mouth every 8 (eight) hours.  0  . pantoprazole (PROTONIX) 40 MG tablet Take 40 mg by mouth 2 (two) times daily.     . potassium chloride SA (K-DUR,KLOR-CON) 20 MEQ tablet Take 10 mEq by mouth 2 (two) times daily.   1  . Prenatal Vit-Fe Fumarate-FA (PRENATAL PO) Take 1 tablet by mouth daily.    . nicotine (NICODERM CQ - DOSED IN MG/24 HOURS) 14 mg/24hr patch Place 1 patch (14 mg total) onto the skin daily. (Patient not taking: Reported on 08/29/2016) 28 patch 0  . nitroGLYCERIN (NITROSTAT) 0.4 MG SL tablet Place 0.4 mg under the tongue every 5 (five) minutes as needed. For chest pain     . predniSONE (DELTASONE) 5 MG tablet Take 5 mg by mouth every other day.     . traMADol (ULTRAM) 50 MG tablet Take 1 tablet (50 mg total) by mouth every 6 (six) hours as needed for moderate pain. (Patient not taking: Reported on 08/29/2016) 30 tablet     No current facility-administered medications for this visit.     Review of Systems : See HPI for pertinent positives and negatives.  Physical Examination  Vitals:   08/29/16 1155  BP: 126/61  Pulse: 78  Temp: 98.4 F (36.9 C)  TempSrc: Oral  SpO2: 99%  Weight:  97 lb (44 kg)  Height: '5\' 4"'$  (1.626 m)   Body mass index is 16.65 kg/m.  General: WDWN thin female in NAD  GAIT: slow, deliberate, using rolling walker Eyes: PERRLA, mild/moderate bilateral exophthalmos (history of Grave's Disease). Pulmonary: Respirations are non labored, slightly limited air movement, no rales,  rhonchi, or wheezing.  Cardiac: regular rhythm, no detected murmur.   VASCULAR EXAM  Carotid Bruits  Left  Right    Negative  Negative   Aorta is not palpable.  Radial pulses are 2+ palpable and equal.   LE Pulses  LEFT  RIGHT   POSTERIOR TIBIAL  palpable  not palpable   DORSALIS PEDIS  ANTERIOR TIBIAL  not palpable  palpable    Gastrointestinal: soft, nontender, BS WNL, no r/g, no palpable masses.  Musculoskeletal: No muscle atrophy/wasting. M/S 3/5 in UE's, 2/5 in LE's, Extremities without ischemic changes.  Neurologic: A&O X 3; Appropriate Affect, Speech is normal  CN 2-12 intact except is slightly hard of hearing, Pain and light touch intact in extremities except has pain to touch in feet, Motor exam as listed above.   Assessment: [name] is a 70 y.o. female who is s/p  left carotid endarterectomy in September of 2011.  She has no history of stroke or TIA.  Fortunately she does not have DM.  Unfortunately she continues to smoke, see Plan.   DATA Today's carotid Duplex reveals 60%-79% right ICA stenosis. Left internal carotid artery is patent with history of carotid endarterectomy, mild hyperplasia present involving the mid/distal patch with elevated velocities suggestive of <40% restenosis. Right ECA stenosis. Bilateral vertebral arteries are  antegrade. Bilateral subclavian arteries are triphasic.  No significant change compared to the exam on 02/22/16.     Plan:  The patient was counseled re smoking cessation and given several free resources re smoking cessation.   Follow-up in 6 months with Carotid Duplex scan.   I discussed in depth with the patient the nature of atherosclerosis, and emphasized the importance of maximal medical management including strict control of blood pressure, blood glucose, and lipid levels, obtaining regular exercise, and cessation of smoking.  The patient is aware that without maximal medical management the underlying atherosclerotic disease process will progress, limiting the benefit of any interventions. The patient was given information about stroke prevention and what symptoms should prompt the patient to seek immediate medical care. Thank you for allowing [place] to participate in this patient's care.  Clemon Chambers, RN, MSN, FNP-C [facility] Office: [phone]  Clinic Physician: Scot Dock  08/29/16 12:17 PM

## 2016-09-14 NOTE — Addendum Note (Signed)
Addended by: Thresa Ross C on: 09/14/2016 10:37 AM   Modules accepted: Orders

## 2016-12-17 DIAGNOSIS — M4800 Spinal stenosis, site unspecified: Secondary | ICD-10-CM | POA: Diagnosis not present

## 2016-12-17 DIAGNOSIS — M797 Fibromyalgia: Secondary | ICD-10-CM | POA: Diagnosis not present

## 2016-12-17 DIAGNOSIS — G8929 Other chronic pain: Secondary | ICD-10-CM | POA: Diagnosis not present

## 2017-01-16 DIAGNOSIS — M5416 Radiculopathy, lumbar region: Secondary | ICD-10-CM | POA: Diagnosis not present

## 2017-01-16 DIAGNOSIS — E559 Vitamin D deficiency, unspecified: Secondary | ICD-10-CM | POA: Diagnosis not present

## 2017-01-16 DIAGNOSIS — Z79899 Other long term (current) drug therapy: Secondary | ICD-10-CM | POA: Diagnosis not present

## 2017-01-16 DIAGNOSIS — Z0000 Encounter for general adult medical examination without abnormal findings: Secondary | ICD-10-CM | POA: Diagnosis not present

## 2017-01-16 DIAGNOSIS — Z1389 Encounter for screening for other disorder: Secondary | ICD-10-CM | POA: Diagnosis not present

## 2017-01-16 DIAGNOSIS — Z131 Encounter for screening for diabetes mellitus: Secondary | ICD-10-CM | POA: Diagnosis not present

## 2017-01-16 DIAGNOSIS — M129 Arthropathy, unspecified: Secondary | ICD-10-CM | POA: Diagnosis not present

## 2017-01-16 DIAGNOSIS — M25511 Pain in right shoulder: Secondary | ICD-10-CM | POA: Diagnosis not present

## 2017-01-16 DIAGNOSIS — R0602 Shortness of breath: Secondary | ICD-10-CM | POA: Diagnosis not present

## 2017-01-16 DIAGNOSIS — R5383 Other fatigue: Secondary | ICD-10-CM | POA: Diagnosis not present

## 2017-01-16 DIAGNOSIS — M25512 Pain in left shoulder: Secondary | ICD-10-CM | POA: Diagnosis not present

## 2017-01-23 DIAGNOSIS — Z79899 Other long term (current) drug therapy: Secondary | ICD-10-CM | POA: Diagnosis not present

## 2017-01-23 DIAGNOSIS — G8929 Other chronic pain: Secondary | ICD-10-CM | POA: Diagnosis not present

## 2017-01-23 DIAGNOSIS — M5416 Radiculopathy, lumbar region: Secondary | ICD-10-CM | POA: Diagnosis not present

## 2017-01-23 DIAGNOSIS — M109 Gout, unspecified: Secondary | ICD-10-CM | POA: Diagnosis not present

## 2017-02-21 ENCOUNTER — Encounter: Payer: Self-pay | Admitting: Family

## 2017-02-25 DIAGNOSIS — M5416 Radiculopathy, lumbar region: Secondary | ICD-10-CM | POA: Diagnosis not present

## 2017-02-25 DIAGNOSIS — E7800 Pure hypercholesterolemia, unspecified: Secondary | ICD-10-CM | POA: Diagnosis not present

## 2017-02-25 DIAGNOSIS — Z79899 Other long term (current) drug therapy: Secondary | ICD-10-CM | POA: Diagnosis not present

## 2017-02-25 DIAGNOSIS — G8929 Other chronic pain: Secondary | ICD-10-CM | POA: Diagnosis not present

## 2017-02-25 DIAGNOSIS — E559 Vitamin D deficiency, unspecified: Secondary | ICD-10-CM | POA: Diagnosis not present

## 2017-02-28 DIAGNOSIS — N183 Chronic kidney disease, stage 3 (moderate): Secondary | ICD-10-CM | POA: Diagnosis not present

## 2017-02-28 DIAGNOSIS — E119 Type 2 diabetes mellitus without complications: Secondary | ICD-10-CM | POA: Diagnosis not present

## 2017-02-28 DIAGNOSIS — B889 Infestation, unspecified: Secondary | ICD-10-CM | POA: Diagnosis not present

## 2017-03-06 ENCOUNTER — Encounter (HOSPITAL_COMMUNITY): Payer: Self-pay

## 2017-03-06 ENCOUNTER — Ambulatory Visit: Payer: Self-pay | Admitting: Family

## 2017-03-12 ENCOUNTER — Encounter: Payer: Self-pay | Admitting: Family

## 2017-03-19 ENCOUNTER — Ambulatory Visit (HOSPITAL_COMMUNITY): Payer: Medicare Other

## 2017-03-19 ENCOUNTER — Ambulatory Visit: Payer: Medicare Other | Admitting: Family

## 2017-04-01 DIAGNOSIS — G8929 Other chronic pain: Secondary | ICD-10-CM | POA: Diagnosis not present

## 2017-04-01 DIAGNOSIS — E119 Type 2 diabetes mellitus without complications: Secondary | ICD-10-CM | POA: Diagnosis not present

## 2017-04-01 DIAGNOSIS — M5416 Radiculopathy, lumbar region: Secondary | ICD-10-CM | POA: Diagnosis not present

## 2017-04-01 DIAGNOSIS — Z79899 Other long term (current) drug therapy: Secondary | ICD-10-CM | POA: Diagnosis not present

## 2017-04-18 ENCOUNTER — Encounter: Payer: Self-pay | Admitting: Family

## 2017-04-30 ENCOUNTER — Encounter (HOSPITAL_COMMUNITY): Payer: Medicare Other

## 2017-04-30 ENCOUNTER — Ambulatory Visit: Payer: Medicare Other | Admitting: Family

## 2017-04-30 DIAGNOSIS — E7800 Pure hypercholesterolemia, unspecified: Secondary | ICD-10-CM | POA: Diagnosis not present

## 2017-04-30 DIAGNOSIS — E0590 Thyrotoxicosis, unspecified without thyrotoxic crisis or storm: Secondary | ICD-10-CM | POA: Diagnosis not present

## 2017-04-30 DIAGNOSIS — Z79899 Other long term (current) drug therapy: Secondary | ICD-10-CM | POA: Diagnosis not present

## 2017-04-30 DIAGNOSIS — I10 Essential (primary) hypertension: Secondary | ICD-10-CM | POA: Diagnosis not present

## 2017-04-30 DIAGNOSIS — Z1389 Encounter for screening for other disorder: Secondary | ICD-10-CM | POA: Diagnosis not present

## 2017-04-30 DIAGNOSIS — E119 Type 2 diabetes mellitus without complications: Secondary | ICD-10-CM | POA: Diagnosis not present

## 2017-04-30 DIAGNOSIS — G8929 Other chronic pain: Secondary | ICD-10-CM | POA: Diagnosis not present

## 2017-04-30 DIAGNOSIS — M5416 Radiculopathy, lumbar region: Secondary | ICD-10-CM | POA: Diagnosis not present

## 2017-06-05 ENCOUNTER — Encounter: Payer: Self-pay | Admitting: Family

## 2017-06-05 DIAGNOSIS — G8929 Other chronic pain: Secondary | ICD-10-CM | POA: Diagnosis not present

## 2017-06-05 DIAGNOSIS — E0590 Thyrotoxicosis, unspecified without thyrotoxic crisis or storm: Secondary | ICD-10-CM | POA: Diagnosis not present

## 2017-06-05 DIAGNOSIS — M5416 Radiculopathy, lumbar region: Secondary | ICD-10-CM | POA: Diagnosis not present

## 2017-06-05 DIAGNOSIS — R739 Hyperglycemia, unspecified: Secondary | ICD-10-CM | POA: Diagnosis not present

## 2017-06-05 DIAGNOSIS — Z79899 Other long term (current) drug therapy: Secondary | ICD-10-CM | POA: Diagnosis not present

## 2017-06-14 ENCOUNTER — Ambulatory Visit (HOSPITAL_COMMUNITY)
Admission: RE | Admit: 2017-06-14 | Discharge: 2017-06-14 | Disposition: A | Discharge status: Home / Self Care | Payer: Medicare Other | Source: Ambulatory Visit | Attending: Vascular Surgery | Admitting: Vascular Surgery

## 2017-06-14 ENCOUNTER — Ambulatory Visit (INDEPENDENT_AMBULATORY_CARE_PROVIDER_SITE_OTHER): Payer: Medicare Other | Admitting: Family

## 2017-06-14 ENCOUNTER — Encounter: Payer: Self-pay | Admitting: Family

## 2017-06-14 VITALS — BP 149/82 | HR 82 | Temp 97.2°F | Resp 16 | Ht 64.0 in | Wt 102.0 lb

## 2017-06-14 DIAGNOSIS — F17200 Nicotine dependence, unspecified, uncomplicated: Secondary | ICD-10-CM | POA: Diagnosis not present

## 2017-06-14 DIAGNOSIS — I6523 Occlusion and stenosis of bilateral carotid arteries: Secondary | ICD-10-CM | POA: Diagnosis not present

## 2017-06-14 DIAGNOSIS — Z9889 Other specified postprocedural states: Secondary | ICD-10-CM

## 2017-06-14 LAB — VAS US CAROTID
LEFT ECA DIAS: -17 cm/s
Left CCA dist dias: 19 cm/s
Left CCA dist sys: 61 cm/s
Left CCA prox dias: 20 cm/s
Left CCA prox sys: 66 cm/s
Left ICA dist dias: -28 cm/s
Left ICA dist sys: -76 cm/s
Left ICA prox dias: -36 cm/s
Left ICA prox sys: -105 cm/s
RIGHT CCA MID DIAS: 15 cm/s
RIGHT ECA DIAS: -19 cm/s
Right CCA prox dias: 20 cm/s
Right CCA prox sys: 70 cm/s
Right cca dist sys: -132 cm/s

## 2017-06-14 NOTE — Progress Notes (Signed)
Chief Complaint: Follow up Extracranial Carotid Artery Stenosis   History of Present Illness  [name] is a 70 y.o. female patient of Dr. Scot Dock who underwent a left carotid endarterectomy in September of 2011.  She returns today for routine surveillance.   She denies any history of TIA or stroke symptoms. Specifically she denies a history of amaurosis fugax or monocular blindness, unilateral facial drooping, hemiplegia, or receptive or expressive aphasia.   She has a history of partial gastrectomy for gastric ulcer.  Pt has L4-5 "bad discs", and stenosis, states chronic low back pain with intermittent radiculopathy in left leg.    She reports that she was diagnosed with drug induced Parkinson's Disease.  Pt reports neuropathy in feet, painful to touch, does not know if this is from her back issues.   She denies post prandial abdominal pain.  She was hospitalized in March 2017 with intentional opiate overdose. Pt then received inpatient psychiatric care.   Pt Diabetic: No, states she previously possibly had DM, has occasional hypoglycemia Pt smoker: smoker (1 ppd x 50+ yrs) Pt meds include:  Statin : no  ASA: Yes  Other anticoagulants/antiplatelets: no   Past Medical History:  Diagnosis Date  . Angina   . Anxiety and depression   . Arthritis   . Asthma   . Blood dyscrasia    hx of thrombphlebitis  . CAD (coronary artery disease)   . Carotid artery occlusion   . Chronic kidney disease   . Chronic pain    leg and feet  . COPD (chronic obstructive pulmonary disease) ([place])   . DDD (degenerative disc disease)   . DDD (degenerative disc disease)   . Depression   . Diabetes mellitus   . Diarrhea    chronic   . Dry mouth   . DVT (deep venous thrombosis) ([place])   . Fibromyalgia   . GERD (gastroesophageal reflux disease)   . Grave's disease   . Headache(784.0)   . HTN (hypertension)   . OP (osteoporosis)   . Parkinson's disease ([place])   .  Peripheral vascular disease ([place])   . PUD (peptic ulcer disease)   . Recurrent upper respiratory infection (URI)   . Rhinitis   . Shortness of breath   . Sleep apnea   . Spinal stenosis   . Spinal stenosis of lumbar region 04/23/2013  . Thrombophlebitis   . Thyroid disorder   . Tobacco use disorder 04/23/2013  . Ulcer   . Vitamin B 12 deficiency     Social History Social History  Substance Use Topics  . Smoking status: Current Every Day Smoker    Packs/day: 2.00    Years: 50.00    Types: Cigarettes  . Smokeless tobacco: Never Used     Comment: cessation info given and reviewed  . Alcohol use No    Family History Family History  Problem Relation Age of Onset  . Diabetes Mother   . Hyperlipidemia Mother   . Heart attack Mother   . Other Mother        varicose veins,respiratory,stroke  . Heart disease Mother        before age 42  . Hypertension Mother   . Varicose Veins Mother   . Diabetes Father   . Heart disease Father        before age 38  . Hyperlipidemia Father   . Heart attack Father   . Other Father        varicose veins  .  Hypertension Father   . Varicose Veins Father   . Diabetes Sister   . Heart disease Sister        before age 76  . Hyperlipidemia Sister   . Heart attack Sister   . Other Sister        varicose veins  . Hypertension Sister   . Varicose Veins Sister   . Peripheral vascular disease Sister   . Diabetes Sister   . Hyperlipidemia Sister   . Hypertension Sister   . Varicose Veins Sister     Surgical History Past Surgical History:  Procedure Laterality Date  . CAROTID ENDARTERECTOMY Left Sept. 20,2011   cea  . CHOLECYSTECTOMY  1999  . GASTRECTOMY     age 18   Part of small intestin and part of stomach  . LEFT HEART CATHETERIZATION WITH CORONARY ANGIOGRAM N/A 10/02/2011   Procedure: LEFT HEART CATHETERIZATION WITH CORONARY ANGIOGRAM;  Surgeon: Sueanne Margarita, MD;  Location: [facility];  Service: Cardiovascular;  Laterality: N/A;     No Active Allergies  Current Outpatient Prescriptions  Medication Sig Dispense Refill  . albuterol-ipratropium (COMBIVENT) 18-103 MCG/ACT inhaler Inhale 2 puffs into the lungs every 6 (six) hours as needed for shortness of breath (sob). For shortness of breath    . amLODipine (NORVASC) 10 MG tablet Take 1 tablet (10 mg total) by mouth daily.    [name] aspirin 81 MG tablet Take 81 mg by mouth daily.      . calcium acetate (PHOSLO) 667 MG capsule Take 667 mg by mouth 2 (two) times daily.     . DULoxetine 40 MG CPEP Take 40 mg by mouth daily.  3  . nicotine (NICODERM CQ - DOSED IN MG/24 HOURS) 14 mg/24hr patch Place 1 patch (14 mg total) onto the skin daily. 28 patch 0  . nitroGLYCERIN (NITROSTAT) 0.4 MG SL tablet Place 0.4 mg under the tongue every 5 (five) minutes as needed. For chest pain     . ondansetron (ZOFRAN) 8 MG tablet Take 0.5 tablets (4 mg total) by mouth every 8 (eight) hours as needed for nausea or vomiting. 20 tablet 0  . oxyCODONE-acetaminophen (PERCOCET/ROXICET) 5-325 MG tablet Take 1 tablet by mouth every 8 (eight) hours.  0  . pantoprazole (PROTONIX) 40 MG tablet Take 40 mg by mouth 2 (two) times daily.     . potassium chloride SA (K-DUR,KLOR-CON) 20 MEQ tablet Take 10 mEq by mouth 2 (two) times daily.   1  . predniSONE (DELTASONE) 5 MG tablet Take 5 mg by mouth every other day.     . Prenatal Vit-Fe Fumarate-FA (PRENATAL PO) Take 1 tablet by mouth daily.    [name] acetaminophen (TYLENOL) 500 MG tablet Take 500 mg by mouth every 6 (six) hours as needed for moderate pain or headache.    . clonazePAM (KLONOPIN) 1 MG tablet Take 1 mg by mouth 2 (two) times daily as needed for anxiety (anxiety). For anxiety    . traMADol (ULTRAM) 50 MG tablet Take 1 tablet (50 mg total) by mouth every 6 (six) hours as needed for moderate pain. (Patient not taking: Reported on 06/14/2017) 30 tablet    No current facility-administered medications for this visit.     Review of Systems : See HPI for  pertinent positives and negatives.  Physical Examination  Vitals:   06/14/17 1550 06/14/17 1551 06/14/17 1552  BP: (!) 152/75 (!) 155/77 (!) 149/82  Pulse: 82 82 82  Resp: 16    Temp: [name])  97.2 F (36.2 C)    SpO2: 98%    Weight: 102 lb (46.3 kg)    Height: 5\' 4"  (1.626 m)     Body mass index is 17.51 kg/m.  General: WDWN thin female in NAD  GAIT:slow, deliberate, using rolling walker Eyes: PERRLA, mild/moderate bilateral exophthalmos (history of Grave's Disease). Pulmonary: Respirations are non labored, slightly limited air movement, no rales,  rhonchi, few wheezes posterior fields.  Cardiac: regular rhythm and rate, no detected murmur.   VASCULAR EXAM Carotid Bruits Left Right   Negative  Negative    Abdominal aortic pulse is not palpable.  Radial pulses are 2+ palpable and equal.   LE Pulses  LEFT  RIGHT   POPLITEAL palpable  palpable  POSTERIOR TIBIAL  palpable   palpable   DORSALIS PEDIS ANTERIOR TIBIAL   palpable  palpable    Gastrointestinal: soft, nontender, BS WNL, no r/g, no palpable masses.  Musculoskeletal: No muscle atrophy/wasting. M/S 4/5 in UE's, 3/5 in LE's, Extremities without ischemic changes. Scoliosis, moderate kyphosis.  Neurologic: A&O X 3; appropriate affect, speech is normal, generalized mild tremor, anxious, CN 2-12 intact except is slightly hard of hearing, Pain and light touch intact in extremities, Motor exam as listed above.     Assessment: [name] is a 71 y.o. female who is s/p left carotid endarterectomy in September of 2011.  She has no history of stroke or TIA.  Fortunately she does not have DM.  Unfortunately she continues to smoke, see Plan.   DATA Carotid Duplex (06/14/17):  60%-79%right ICA stenosis. Left internal carotid artery is patent with history of carotid endarterectomy, 1-39% restenosis. Right ECA stenosis >50%. Bilateral vertebral arteries are antegrade. Bilateral  subclavian arteries are triphasic.  No significant change compared to the exams on 02/22/16 and 08/29/16.     Plan:  The patient was counseled re smoking cessation and given several free resources re smoking cessation.   Follow-up in 6 months with Carotid Duplex scan.    I discussed in depth with the patient the nature of atherosclerosis, and emphasized the importance of maximal medical management including strict control of blood pressure, blood glucose, and lipid levels, obtaining regular exercise, and cessation of smoking.  The patient is aware that without maximal medical management the underlying atherosclerotic disease process will progress, limiting the benefit of any interventions. The patient was given information about stroke prevention and what symptoms should prompt the patient to seek immediate medical care. Thank you for allowing [place] to participate in this patient's care.  Clemon Chambers, RN, MSN, FNP-C [facility] Office: [phone]  Clinic Physician: Donzetta Matters  06/14/17 4:01 PM

## 2017-06-14 NOTE — Patient Instructions (Signed)
Stroke Prevention Some medical conditions and behaviors are associated with an increased chance of having a stroke. You may prevent a stroke by making healthy choices and managing medical conditions. How can I reduce my risk of having a stroke?  Stay physically active. Get at least 30 minutes of activity on most or all days.  Do not smoke. It may also be helpful to avoid exposure to secondhand smoke.  Limit alcohol use. Moderate alcohol use is considered to be: ? No more than 2 drinks per day for men. ? No more than 1 drink per day for nonpregnant women.  Eat healthy foods. This involves: ? Eating 5 or more servings of fruits and vegetables a day. ? Making dietary changes that address high blood pressure (hypertension), high cholesterol, diabetes, or obesity.  Manage your cholesterol levels. ? Making food choices that are high in fiber and low in saturated fat, trans fat, and cholesterol may control cholesterol levels. ? Take any prescribed medicines to control cholesterol as directed by your health care provider.  Manage your diabetes. ? Controlling your carbohydrate and sugar intake is recommended to manage diabetes. ? Take any prescribed medicines to control diabetes as directed by your health care provider.  Control your hypertension. ? Making food choices that are low in salt (sodium), saturated fat, trans fat, and cholesterol is recommended to manage hypertension. ? Ask your health care provider if you need treatment to lower your blood pressure. Take any prescribed medicines to control hypertension as directed by your health care provider. ? If you are 18-39 years of age, have your blood pressure checked every 3-5 years. If you are 40 years of age or older, have your blood pressure checked every year.  Maintain a healthy weight. ? Reducing calorie intake and making food choices that are low in sodium, saturated fat, trans fat, and cholesterol are recommended to manage  weight.  Stop drug abuse.  Avoid taking birth control pills. ? Talk to your health care provider about the risks of taking birth control pills if you are over 35 years old, smoke, get migraines, or have ever had a blood clot.  Get evaluated for sleep disorders (sleep apnea). ? Talk to your health care provider about getting a sleep evaluation if you snore a lot or have excessive sleepiness.  Take medicines only as directed by your health care provider. ? For some people, aspirin or blood thinners (anticoagulants) are helpful in reducing the risk of forming abnormal blood clots that can lead to stroke. If you have the irregular heart rhythm of atrial fibrillation, you should be on a blood thinner unless there is a good reason you cannot take them. ? Understand all your medicine instructions.  Make sure that other conditions (such as anemia or atherosclerosis) are addressed. Get help right away if:  You have sudden weakness or numbness of the face, arm, or leg, especially on one side of the body.  Your face or eyelid droops to one side.  You have sudden confusion.  You have trouble speaking (aphasia) or understanding.  You have sudden trouble seeing in one or both eyes.  You have sudden trouble walking.  You have dizziness.  You have a loss of balance or coordination.  You have a sudden, severe headache with no known cause.  You have new chest pain or an irregular heartbeat. Any of these symptoms may represent a serious problem that is an emergency. Do not wait to see if the symptoms will go away.   Get medical help at once. Call your local emergency services (911 in [place] Do not drive yourself to the hospital. This information is not intended to replace advice given to you by your health care provider. Make sure you discuss any questions you have with your health care provider. Document Released: 11/22/2004 Document Revised: 03/22/2016 Document Reviewed: 04/17/2013 Elsevier  Interactive Patient Education  2017 [employer].     Steps to Quit Smoking Smoking tobacco can be bad for your health. It can also affect almost every organ in your body. Smoking puts you and people around you at risk for many serious long-lasting (chronic) diseases. Quitting smoking is hard, but it is one of the best things that you can do for your health. It is never too late to quit. What are the benefits of quitting smoking? When you quit smoking, you lower your risk for getting serious diseases and conditions. They can include:  Lung cancer or lung disease.  Heart disease.  Stroke.  Heart attack.  Not being able to have children (infertility).  Weak bones (osteoporosis) and broken bones (fractures).  If you have coughing, wheezing, and shortness of breath, those symptoms may get better when you quit. You may also get sick less often. If you are pregnant, quitting smoking can help to lower your chances of having a baby of low birth weight. What can I do to help me quit smoking? Talk with your doctor about what can help you quit smoking. Some things you can do (strategies) include:  Quitting smoking totally, instead of slowly cutting back how much you smoke over a period of time.  Going to in-person counseling. You are more likely to quit if you go to many counseling sessions.  Using resources and support systems, such as: ? Database administrator with a Social worker. ? Phone quitlines. ? Careers information officer. ? Support groups or group counseling. ? Text messaging programs. ? Mobile phone apps or applications.  Taking medicines. Some of these medicines may have nicotine in them. If you are pregnant or breastfeeding, do not take any medicines to quit smoking unless your doctor says it is okay. Talk with your doctor about counseling or other things that can help you.  Talk with your doctor about using more than one strategy at the same time, such as taking medicines while you are  also going to in-person counseling. This can help make quitting easier. What things can I do to make it easier to quit? Quitting smoking might feel very hard at first, but there is a lot that you can do to make it easier. Take these steps:  Talk to your family and friends. Ask them to support and encourage you.  Call phone quitlines, reach out to support groups, or work with a Social worker.  Ask people who smoke to not smoke around you.  Avoid places that make you want (trigger) to smoke, such as: ? Bars. ? Parties. ? Smoke-break areas at work.  Spend time with people who do not smoke.  Lower the stress in your life. Stress can make you want to smoke. Try these things to help your stress: ? Getting regular exercise. ? Deep-breathing exercises. ? Yoga. ? Meditating. ? Doing a body scan. To do this, close your eyes, focus on one area of your body at a time from head to toe, and notice which parts of your body are tense. Try to relax the muscles in those areas.  Download or buy apps on your mobile phone or  tablet that can help you stick to your quit plan. There are many free apps, such as QuitGuide from the [employer] Office manager for Disease Control and Prevention). You can find more support from smokefree.gov and other websites.  This information is not intended to replace advice given to you by your health care provider. Make sure you discuss any questions you have with your health care provider. Document Released: 08/11/2009 Document Revised: 06/12/2016 Document Reviewed: 03/01/2015 Elsevier Interactive Patient Education  2018 [employer].

## 2017-06-14 NOTE — Progress Notes (Signed)
Vitals:   06/14/17 1550 06/14/17 1551  BP: (!) 152/75 (!) 155/77  Pulse: 82 82  Resp: 16   Temp: (!) 97.2 F (36.2 C)   SpO2: 98%   Weight: 102 lb (46.3 kg)   Height: 5\' 4"  (1.626 m)

## 2017-06-17 NOTE — Addendum Note (Signed)
Addended by: Lianne Cure A on: 06/17/2017 09:42 AM   Modules accepted: Orders

## 2017-06-20 DIAGNOSIS — E538 Deficiency of other specified B group vitamins: Secondary | ICD-10-CM | POA: Diagnosis not present

## 2017-06-20 DIAGNOSIS — E1122 Type 2 diabetes mellitus with diabetic chronic kidney disease: Secondary | ICD-10-CM | POA: Diagnosis not present

## 2017-06-20 DIAGNOSIS — I129 Hypertensive chronic kidney disease with stage 1 through stage 4 chronic kidney disease, or unspecified chronic kidney disease: Secondary | ICD-10-CM | POA: Diagnosis not present

## 2017-06-20 DIAGNOSIS — I5032 Chronic diastolic (congestive) heart failure: Secondary | ICD-10-CM | POA: Diagnosis not present

## 2017-06-20 DIAGNOSIS — J449 Chronic obstructive pulmonary disease, unspecified: Secondary | ICD-10-CM | POA: Diagnosis not present

## 2017-07-09 DIAGNOSIS — E1165 Type 2 diabetes mellitus with hyperglycemia: Secondary | ICD-10-CM | POA: Diagnosis not present

## 2017-07-09 DIAGNOSIS — I10 Essential (primary) hypertension: Secondary | ICD-10-CM | POA: Diagnosis not present

## 2017-07-09 DIAGNOSIS — G8929 Other chronic pain: Secondary | ICD-10-CM | POA: Diagnosis not present

## 2017-07-09 DIAGNOSIS — Z79899 Other long term (current) drug therapy: Secondary | ICD-10-CM | POA: Diagnosis not present

## 2017-07-09 DIAGNOSIS — E7800 Pure hypercholesterolemia, unspecified: Secondary | ICD-10-CM | POA: Diagnosis not present

## 2017-07-09 DIAGNOSIS — M5416 Radiculopathy, lumbar region: Secondary | ICD-10-CM | POA: Diagnosis not present

## 2017-08-06 DIAGNOSIS — M5416 Radiculopathy, lumbar region: Secondary | ICD-10-CM | POA: Diagnosis not present

## 2017-08-06 DIAGNOSIS — E7800 Pure hypercholesterolemia, unspecified: Secondary | ICD-10-CM | POA: Diagnosis not present

## 2017-08-06 DIAGNOSIS — Z23 Encounter for immunization: Secondary | ICD-10-CM | POA: Diagnosis not present

## 2017-08-06 DIAGNOSIS — Z79899 Other long term (current) drug therapy: Secondary | ICD-10-CM | POA: Diagnosis not present

## 2017-08-06 DIAGNOSIS — G8929 Other chronic pain: Secondary | ICD-10-CM | POA: Diagnosis not present

## 2017-09-06 DIAGNOSIS — E559 Vitamin D deficiency, unspecified: Secondary | ICD-10-CM | POA: Diagnosis not present

## 2017-09-06 DIAGNOSIS — Z79899 Other long term (current) drug therapy: Secondary | ICD-10-CM | POA: Diagnosis not present

## 2017-09-06 DIAGNOSIS — G8929 Other chronic pain: Secondary | ICD-10-CM | POA: Diagnosis not present

## 2017-09-06 DIAGNOSIS — M5416 Radiculopathy, lumbar region: Secondary | ICD-10-CM | POA: Diagnosis not present

## 2017-09-30 DIAGNOSIS — H34811 Central retinal vein occlusion, right eye, with macular edema: Secondary | ICD-10-CM | POA: Diagnosis not present

## 2017-10-01 DIAGNOSIS — H34811 Central retinal vein occlusion, right eye, with macular edema: Secondary | ICD-10-CM | POA: Diagnosis not present

## 2017-10-01 DIAGNOSIS — H3561 Retinal hemorrhage, right eye: Secondary | ICD-10-CM | POA: Diagnosis not present

## 2017-10-01 DIAGNOSIS — H40051 Ocular hypertension, right eye: Secondary | ICD-10-CM | POA: Diagnosis not present

## 2017-10-09 ENCOUNTER — Emergency Department (HOSPITAL_COMMUNITY): Payer: Medicare Other

## 2017-10-09 ENCOUNTER — Other Ambulatory Visit: Payer: Self-pay

## 2017-10-09 ENCOUNTER — Inpatient Hospital Stay (HOSPITAL_COMMUNITY)
Admission: EM | Admit: 2017-10-09 | Discharge: 2017-10-16 | DRG: 871 | Disposition: A | Discharge status: Skilled Nursing Facility | Payer: Medicare Other | Attending: Family Medicine | Admitting: Family Medicine

## 2017-10-09 ENCOUNTER — Encounter (HOSPITAL_COMMUNITY): Payer: Self-pay

## 2017-10-09 DIAGNOSIS — R6510 Systemic inflammatory response syndrome (SIRS) of non-infectious origin without acute organ dysfunction: Secondary | ICD-10-CM | POA: Diagnosis not present

## 2017-10-09 DIAGNOSIS — R0902 Hypoxemia: Secondary | ICD-10-CM

## 2017-10-09 DIAGNOSIS — R51 Headache: Secondary | ICD-10-CM | POA: Diagnosis not present

## 2017-10-09 DIAGNOSIS — E063 Autoimmune thyroiditis: Secondary | ICD-10-CM | POA: Diagnosis present

## 2017-10-09 DIAGNOSIS — N183 Chronic kidney disease, stage 3 (moderate): Secondary | ICD-10-CM | POA: Diagnosis not present

## 2017-10-09 DIAGNOSIS — M810 Age-related osteoporosis without current pathological fracture: Secondary | ICD-10-CM | POA: Diagnosis present

## 2017-10-09 DIAGNOSIS — R519 Headache, unspecified: Secondary | ICD-10-CM | POA: Diagnosis present

## 2017-10-09 DIAGNOSIS — G9341 Metabolic encephalopathy: Secondary | ICD-10-CM | POA: Diagnosis present

## 2017-10-09 DIAGNOSIS — I2510 Atherosclerotic heart disease of native coronary artery without angina pectoris: Secondary | ICD-10-CM | POA: Diagnosis not present

## 2017-10-09 DIAGNOSIS — E874 Mixed disorder of acid-base balance: Secondary | ICD-10-CM | POA: Diagnosis present

## 2017-10-09 DIAGNOSIS — E0500 Thyrotoxicosis with diffuse goiter without thyrotoxic crisis or storm: Secondary | ICD-10-CM | POA: Diagnosis not present

## 2017-10-09 DIAGNOSIS — R64 Cachexia: Secondary | ICD-10-CM | POA: Diagnosis present

## 2017-10-09 DIAGNOSIS — I129 Hypertensive chronic kidney disease with stage 1 through stage 4 chronic kidney disease, or unspecified chronic kidney disease: Secondary | ICD-10-CM | POA: Diagnosis present

## 2017-10-09 DIAGNOSIS — F329 Major depressive disorder, single episode, unspecified: Secondary | ICD-10-CM | POA: Diagnosis present

## 2017-10-09 DIAGNOSIS — E1122 Type 2 diabetes mellitus with diabetic chronic kidney disease: Secondary | ICD-10-CM | POA: Diagnosis present

## 2017-10-09 DIAGNOSIS — N1831 Chronic kidney disease, stage 3a: Secondary | ICD-10-CM | POA: Diagnosis present

## 2017-10-09 DIAGNOSIS — G20 Parkinson's disease: Secondary | ICD-10-CM | POA: Diagnosis present

## 2017-10-09 DIAGNOSIS — G9340 Encephalopathy, unspecified: Secondary | ICD-10-CM | POA: Diagnosis not present

## 2017-10-09 DIAGNOSIS — E1151 Type 2 diabetes mellitus with diabetic peripheral angiopathy without gangrene: Secondary | ICD-10-CM | POA: Diagnosis present

## 2017-10-09 DIAGNOSIS — J189 Pneumonia, unspecified organism: Secondary | ICD-10-CM | POA: Diagnosis present

## 2017-10-09 DIAGNOSIS — E538 Deficiency of other specified B group vitamins: Secondary | ICD-10-CM | POA: Diagnosis present

## 2017-10-09 DIAGNOSIS — E876 Hypokalemia: Secondary | ICD-10-CM | POA: Diagnosis present

## 2017-10-09 DIAGNOSIS — R0781 Pleurodynia: Secondary | ICD-10-CM | POA: Diagnosis present

## 2017-10-09 DIAGNOSIS — Z86718 Personal history of other venous thrombosis and embolism: Secondary | ICD-10-CM

## 2017-10-09 DIAGNOSIS — Z915 Personal history of self-harm: Secondary | ICD-10-CM

## 2017-10-09 DIAGNOSIS — K219 Gastro-esophageal reflux disease without esophagitis: Secondary | ICD-10-CM | POA: Diagnosis present

## 2017-10-09 DIAGNOSIS — T39094A Poisoning by salicylates, undetermined, initial encounter: Secondary | ICD-10-CM | POA: Diagnosis not present

## 2017-10-09 DIAGNOSIS — R079 Chest pain, unspecified: Secondary | ICD-10-CM | POA: Diagnosis not present

## 2017-10-09 DIAGNOSIS — J440 Chronic obstructive pulmonary disease with acute lower respiratory infection: Secondary | ICD-10-CM | POA: Diagnosis present

## 2017-10-09 DIAGNOSIS — E43 Unspecified severe protein-calorie malnutrition: Secondary | ICD-10-CM | POA: Diagnosis not present

## 2017-10-09 DIAGNOSIS — T39094D Poisoning by salicylates, undetermined, subsequent encounter: Secondary | ICD-10-CM | POA: Diagnosis present

## 2017-10-09 DIAGNOSIS — N189 Chronic kidney disease, unspecified: Secondary | ICD-10-CM | POA: Diagnosis not present

## 2017-10-09 DIAGNOSIS — A419 Sepsis, unspecified organism: Principal | ICD-10-CM | POA: Diagnosis present

## 2017-10-09 DIAGNOSIS — F17210 Nicotine dependence, cigarettes, uncomplicated: Secondary | ICD-10-CM | POA: Diagnosis present

## 2017-10-09 DIAGNOSIS — Z79899 Other long term (current) drug therapy: Secondary | ICD-10-CM

## 2017-10-09 DIAGNOSIS — Z8672 Personal history of thrombophlebitis: Secondary | ICD-10-CM

## 2017-10-09 DIAGNOSIS — G8929 Other chronic pain: Secondary | ICD-10-CM | POA: Diagnosis present

## 2017-10-09 DIAGNOSIS — R9431 Abnormal electrocardiogram [ECG] [EKG]: Secondary | ICD-10-CM | POA: Diagnosis not present

## 2017-10-09 DIAGNOSIS — R410 Disorientation, unspecified: Secondary | ICD-10-CM | POA: Diagnosis not present

## 2017-10-09 DIAGNOSIS — F419 Anxiety disorder, unspecified: Secondary | ICD-10-CM | POA: Diagnosis present

## 2017-10-09 DIAGNOSIS — H9190 Unspecified hearing loss, unspecified ear: Secondary | ICD-10-CM | POA: Diagnosis present

## 2017-10-09 DIAGNOSIS — T39091A Poisoning by salicylates, accidental (unintentional), initial encounter: Secondary | ICD-10-CM | POA: Diagnosis not present

## 2017-10-09 DIAGNOSIS — J449 Chronic obstructive pulmonary disease, unspecified: Secondary | ICD-10-CM | POA: Diagnosis not present

## 2017-10-09 DIAGNOSIS — N179 Acute kidney failure, unspecified: Secondary | ICD-10-CM | POA: Diagnosis present

## 2017-10-09 DIAGNOSIS — Z681 Body mass index (BMI) 19 or less, adult: Secondary | ICD-10-CM

## 2017-10-09 DIAGNOSIS — G4730 Sleep apnea, unspecified: Secondary | ICD-10-CM | POA: Diagnosis present

## 2017-10-09 DIAGNOSIS — D72825 Bandemia: Secondary | ICD-10-CM | POA: Diagnosis present

## 2017-10-09 LAB — URINALYSIS, ROUTINE W REFLEX MICROSCOPIC
Bilirubin Urine: NEGATIVE
Glucose, UA: NEGATIVE mg/dL
Hgb urine dipstick: NEGATIVE
Ketones, ur: NEGATIVE mg/dL
Leukocytes, UA: NEGATIVE
Nitrite: NEGATIVE
Protein, ur: NEGATIVE mg/dL
Specific Gravity, Urine: 1.018 (ref 1.005–1.030)
pH: 5 (ref 5.0–8.0)

## 2017-10-09 LAB — CBC
HCT: 35.2 % — ABNORMAL LOW (ref 36.0–46.0)
Hemoglobin: 11.1 g/dL — ABNORMAL LOW (ref 12.0–15.0)
MCH: 30.2 pg (ref 26.0–34.0)
MCHC: 31.5 g/dL (ref 30.0–36.0)
MCV: 95.9 fL (ref 78.0–100.0)
Platelets: 246 10*3/uL (ref 150–400)
RBC: 3.67 MIL/uL — ABNORMAL LOW (ref 3.87–5.11)
RDW: 16 % — ABNORMAL HIGH (ref 11.5–15.5)
WBC: 17 10*3/uL — ABNORMAL HIGH (ref 4.0–10.5)

## 2017-10-09 LAB — BASIC METABOLIC PANEL
Anion gap: 14 (ref 5–15)
BUN: 34 mg/dL — ABNORMAL HIGH (ref 6–20)
CO2: 17 mmol/L — ABNORMAL LOW (ref 22–32)
Calcium: 6.9 mg/dL — ABNORMAL LOW (ref 8.9–10.3)
Chloride: 112 mmol/L — ABNORMAL HIGH (ref 101–111)
Creatinine, Ser: 2.11 mg/dL — ABNORMAL HIGH (ref 0.44–1.00)
GFR calc Af Amer: 26 mL/min — ABNORMAL LOW (ref >60)
GFR calc non Af Amer: 22 mL/min — ABNORMAL LOW (ref >60)
Glucose, Bld: 104 mg/dL — ABNORMAL HIGH (ref 65–99)
Potassium: 4.2 mmol/L (ref 3.5–5.1)
Sodium: 143 mmol/L (ref 135–145)

## 2017-10-09 LAB — TSH: TSH: 0.105 u[IU]/mL — ABNORMAL LOW (ref 0.350–4.500)

## 2017-10-09 LAB — I-STAT TROPONIN, ED: Troponin i, poc: 0.01 ng/mL (ref 0.00–0.08)

## 2017-10-09 LAB — I-STAT ARTERIAL BLOOD GAS, ED
Acid-base deficit: 12 mmol/L — ABNORMAL HIGH (ref 0.0–2.0)
Bicarbonate: 10.6 mmol/L — ABNORMAL LOW (ref 20.0–28.0)
O2 Saturation: 96 %
Patient temperature: 98.4
TCO2: 11 mmol/L — ABNORMAL LOW (ref 22–32)
pCO2 arterial: 17.3 mmHg — CL (ref 32.0–48.0)
pH, Arterial: 7.395 (ref 7.350–7.450)
pO2, Arterial: 81 mmHg — ABNORMAL LOW (ref 83.0–108.0)

## 2017-10-09 LAB — AMMONIA: Ammonia: 33 umol/L (ref 9–35)

## 2017-10-09 LAB — T4, FREE: Free T4: 0.9 ng/dL (ref 0.61–1.12)

## 2017-10-09 MED ORDER — IPRATROPIUM-ALBUTEROL 0.5-2.5 (3) MG/3ML IN SOLN
3.0000 mL | Freq: Once | RESPIRATORY_TRACT | Status: AC
  Administered 2017-10-09: 3 mL via RESPIRATORY_TRACT
  Filled 2017-10-09: qty 3

## 2017-10-09 MED ORDER — SODIUM BICARBONATE 8.4 % IV SOLN
50.0000 meq | Freq: Once | INTRAVENOUS | Status: AC
  Administered 2017-10-09: 50 meq via INTRAVENOUS
  Filled 2017-10-09: qty 50

## 2017-10-09 MED ORDER — SODIUM CHLORIDE 0.9 % IV BOLUS (SEPSIS)
1000.0000 mL | Freq: Once | INTRAVENOUS | Status: AC
  Administered 2017-10-09: 1000 mL via INTRAVENOUS

## 2017-10-09 MED ORDER — STERILE WATER FOR INJECTION IV SOLN
INTRAVENOUS | Status: DC
  Administered 2017-10-10: 01:00:00 via INTRAVENOUS
  Filled 2017-10-09 (×2): qty 9.71

## 2017-10-09 NOTE — ED Provider Notes (Signed)
[facility] EMERGENCY DEPARTMENT Provider Note  CSN: [id number] Arrival date & time: 10/09/17 1126  Chief Complaint(s) Chest Pain and Headache  HPI [name] is a 71 y.o. female with an extensive past medical history including hypertension, CAD, angina, chronic back pain on narcotic medications who presents to the emergency department for several days of persistent headache and chest pain.  Patient reports that the chest pain was substernal pressure that was exertional.  It was nonradiating.  Alleviated with rest.  Reports that it was constant for 3 days.  No recent fevers or infections.  No change in her cough.  No sputum production.  Headache is generalized achiness, similar to her prior headaches.  But patient does report having some visual issues that have been ongoing for a while, stating that she had ocular injections by her ophthalmologist.  Also reports that she has worsening hearing loss.  States that she does not have her hearing aids.  Denies any head trauma.  No focal weakness.  No alleviating or aggravating factors.  Patient was brought in by EMS who gave the patient nitroglycerin in route which completely resolved her chest pain.  They report that the patient took 2 324mg  tablets of aspirin for chest pain.  HPI  Past Medical History Past Medical History:  Diagnosis Date  . Angina   . Anxiety and depression   . Arthritis   . Asthma   . Blood dyscrasia    hx of thrombphlebitis  . CAD (coronary artery disease)   . Carotid artery occlusion   . Chronic kidney disease   . Chronic pain    leg and feet  . COPD (chronic obstructive pulmonary disease) ([place])   . DDD (degenerative disc disease)   . DDD (degenerative disc disease)   . Depression   . Diabetes mellitus   . Diarrhea    chronic   . Dry mouth   . DVT (deep venous thrombosis) ([place])   . Fibromyalgia   . GERD (gastroesophageal reflux disease)   . Grave's disease   . Headache(784.0)   . HTN  (hypertension)   . OP (osteoporosis)   . Parkinson's disease ([place])   . Peripheral vascular disease ([place])   . PUD (peptic ulcer disease)   . Recurrent upper respiratory infection (URI)   . Rhinitis   . Shortness of breath   . Sleep apnea   . Spinal stenosis   . Spinal stenosis of lumbar region 04/23/2013  . Thrombophlebitis   . Thyroid disorder   . Tobacco use disorder 04/23/2013  . Ulcer   . Vitamin B 12 deficiency    Patient Active Problem List   Diagnosis Date Noted  . Protein-calorie malnutrition, severe 01/26/2016  . Opiate overdose ([place]) 01/25/2016  . Suicide attempt ([place]) 01/25/2016  . Depression   . Dyslipidemia   . Gastroesophageal reflux disease without esophagitis   . UTI (lower urinary tract infection) 07/29/2015  . COPD (chronic obstructive pulmonary disease) ([place]) 07/29/2015  . Diabetes mellitus with neurological manifestation ([place]) 07/29/2015  . Frequent falls 07/29/2015  . HTN (hypertension) 07/29/2015  . AKI (acute kidney injury) ([place]) 07/29/2015  . Sepsis ([place]) 07/29/2015  . Aftercare following surgery of the circulatory system, [place] 11/25/2013  . Spinal stenosis of lumbar region 04/23/2013  . Diabetes mellitus ([place]) 04/23/2013  . Tremor due to multiple drugs 04/23/2013  . Tobacco use disorder 04/23/2013  . COPD exacerbation ([place]) 04/23/2013  . Occlusion and stenosis of carotid artery without mention of  cerebral infarction 03/12/2012  . Viral bronchitis-possible h. infl vs Norovirus 11/21/2011   Home Medication(s) Prior to Admission medications   Medication Sig Start Date End Date Taking? Authorizing Provider  acetaminophen (TYLENOL) 500 MG tablet Take 500 mg by mouth every 6 (six) hours as needed for moderate pain or headache.   Yes [provider]  aspirin 81 MG tablet Take 81 mg by mouth daily.     Yes [provider]  calcium acetate (PHOSLO) 667 MG capsule Take 667 mg by mouth 2 (two) times daily.    Yes [provider]    clonazePAM (KLONOPIN) 1 MG tablet Take 1 mg by mouth 2 (two) times daily as needed for anxiety (anxiety). For anxiety   Yes [provider]  albuterol-ipratropium (COMBIVENT) 18-103 MCG/ACT inhaler Inhale 2 puffs into the lungs every 6 (six) hours as needed for shortness of breath (sob). For shortness of breath    [provider]  amLODipine (NORVASC) 10 MG tablet Take 1 tablet (10 mg total) by mouth daily. Patient not taking: Reported on 10/09/2017 01/28/16   Thurnell Lose, MD  DULoxetine 40 MG CPEP Take 40 mg by mouth daily. 02/02/16   Elgergawy, Silver Huguenin, MD  nicotine (NICODERM CQ - DOSED IN MG/24 HOURS) 14 mg/24hr patch Place 1 patch (14 mg total) onto the skin daily. 02/02/16   Elgergawy, Silver Huguenin, MD  nitroGLYCERIN (NITROSTAT) 0.4 MG SL tablet Place 0.4 mg under the tongue every 5 (five) minutes as needed. For chest pain     [provider]  ondansetron (ZOFRAN) 8 MG tablet Take 0.5 tablets (4 mg total) by mouth every 8 (eight) hours as needed for nausea or vomiting. 02/02/16   Elgergawy, Silver Huguenin, MD  oxyCODONE-acetaminophen (PERCOCET/ROXICET) 5-325 MG tablet Take 1 tablet by mouth every 8 (eight) hours. 08/20/16   [provider]  pantoprazole (PROTONIX) 40 MG tablet Take 40 mg by mouth 2 (two) times daily.     [provider]  potassium chloride SA (K-DUR,KLOR-CON) 20 MEQ tablet Take 10 mEq by mouth 2 (two) times daily.  10/18/15   [provider]  predniSONE (DELTASONE) 5 MG tablet Take 5 mg by mouth every other day.     [provider]  Prenatal Vit-Fe Fumarate-FA (PRENATAL PO) Take 1 tablet by mouth daily.    [provider]  traMADol (ULTRAM) 50 MG tablet Take 1 tablet (50 mg total) by mouth every 6 (six) hours as needed for moderate pain. Patient not taking: Reported on 06/14/2017 01/28/16   Thurnell Lose, MD                                                                                                                                     Past Surgical History Past Surgical History:  Procedure Laterality Date  . CAROTID ENDARTERECTOMY Left Sept. 20,2011   cea  . CHOLECYSTECTOMY  1999  .  GASTRECTOMY     age 77   Part of small intestin and part of stomach  . LEFT HEART CATHETERIZATION WITH CORONARY ANGIOGRAM N/A 10/02/2011   Procedure: LEFT HEART CATHETERIZATION WITH CORONARY ANGIOGRAM;  Surgeon: Sueanne Margarita, MD;  Location: [facility] CATH LAB;  Service: Cardiovascular;  Laterality: N/A;   Family History Family History  Problem Relation Age of Onset  . Diabetes Mother   . Hyperlipidemia Mother   . Heart attack Mother   . Other Mother        varicose veins,respiratory,stroke  . Heart disease Mother        before age 26  . Hypertension Mother   . Varicose Veins Mother   . Diabetes Father   . Heart disease Father        before age 66  . Hyperlipidemia Father   . Heart attack Father   . Other Father        varicose veins  . Hypertension Father   . Varicose Veins Father   . Diabetes Sister   . Heart disease Sister        before age 33  . Hyperlipidemia Sister   . Heart attack Sister   . Other Sister        varicose veins  . Hypertension Sister   . Varicose Veins Sister   . Peripheral vascular disease Sister   . Diabetes Sister   . Hyperlipidemia Sister   . Hypertension Sister   . Varicose Veins Sister     Social History Social History   Tobacco Use  . Smoking status: Current Every Day Smoker    Packs/day: 2.00    Years: 50.00    Pack years: 100.00    Types: Cigarettes  . Smokeless tobacco: Never Used  . Tobacco comment: cessation info given and reviewed  Substance Use Topics  . Alcohol use: No    Alcohol/week: 0.0 oz  . Drug use: No   Allergies Patient has no known allergies.  Review of Systems Review of Systems All other systems are reviewed and are negative for acute change except as noted in the HPI  Physical Exam Vital Signs  I have reviewed the triage vital  signs BP (!) 122/56   Pulse 91   Temp (!) 100.4 F (38 C) (Rectal)   Resp (!) 23   Wt 46.3 kg (102 lb)   SpO2 97%   BMI 17.51 kg/m   Physical Exam  Constitutional: She is oriented to person, place, and time. She appears well-developed and well-nourished. No distress.  HENT:  Head: Normocephalic and atraumatic.  Nose: Nose normal.  Eyes: Conjunctivae and EOM are normal. Pupils are equal, round, and reactive to light. Right eye exhibits no discharge. Left eye exhibits no discharge. No scleral icterus.  Neck: Normal range of motion. Neck supple.  Cardiovascular: Normal rate and regular rhythm. Exam reveals no gallop and no friction rub.  No murmur heard. Pulmonary/Chest: Effort normal and breath sounds normal. No stridor. Tachypnea noted. No respiratory distress. She has no rales.  Abdominal: Soft. She exhibits no distension. There is no tenderness.  Musculoskeletal: She exhibits no edema or tenderness.  Neurological: She is alert and oriented to person, place, and time.  Skin: Skin is warm and dry. No rash noted. She is not diaphoretic. No erythema.  Psychiatric: She has a normal mood and affect.  Vitals reviewed.   ED Results and Treatments Labs (all labs ordered are listed, but only abnormal results are displayed) Labs Reviewed  BASIC METABOLIC PANEL - Abnormal; Notable for the following components:      Result Value   Chloride 112 (*)    CO2 17 (*)    Glucose, Bld 104 (*)    BUN 34 (*)    Creatinine, Ser 2.11 (*)    Calcium 6.9 (*)    GFR calc non Af Amer 22 (*)    GFR calc Af Amer 26 (*)    All other components within normal limits  CBC - Abnormal; Notable for the following components:   WBC 17.0 (*)    RBC 3.67 (*)    Hemoglobin 11.1 (*)    HCT 35.2 (*)    RDW 16.0 (*)    All other components within normal limits  TSH - Abnormal; Notable for the following components:   TSH 0.105 (*)    All other components within normal limits  ACETAMINOPHEN LEVEL - Abnormal;  Notable for the following components:   Acetaminophen (Tylenol), Serum <10 (*)    All other components within normal limits  SALICYLATE LEVEL - Abnormal; Notable for the following components:   Salicylate Lvl 71.6 (*)    All other components within normal limits  I-STAT ARTERIAL BLOOD GAS, ED - Abnormal; Notable for the following components:   pCO2 arterial 17.3 (*)    pO2, Arterial 81.0 (*)    Bicarbonate 10.6 (*)    TCO2 11 (*)    Acid-base deficit 12.0 (*)    All other components within normal limits  URINALYSIS, ROUTINE W REFLEX MICROSCOPIC  T4, FREE  AMMONIA  BLOOD GAS, ARTERIAL  RAPID URINE DRUG SCREEN, HOSP PERFORMED  HEPATIC FUNCTION PANEL  I-STAT TROPONIN, ED                                                                                                                         EKG  EKG Interpretation  Date/Time:  Wednesday October 09 2017 20:12:07 EST Ventricular Rate:  94 PR Interval:    QRS Duration: 83 QT Interval:  364 QTC Calculation: 456 R Axis:   -23 Text Interpretation:  Sinus rhythm Probable LVH with secondary repol abnrm minor STD in the inferior leads Confirmed by Addison Lank [phone]) on 10/09/2017 8:30:13 PM      Radiology Dg Chest 2 View  Result Date: 10/09/2017 CLINICAL DATA:  Chest pain EXAM: CHEST  2 VIEW COMPARISON:  07/29/2015 FINDINGS: There is hyperinflation of the lungs compatible with COPD. Scarring in the bases. Heart is normal size. No effusions or acute bony abnormality. IMPRESSION: COPD.  Bibasilar scarring.  No active disease. Electronically Signed   By: Rolm Baptise M.D.   On: 10/09/2017 12:00   Ct Head Wo Contrast  Result Date: 10/09/2017 CLINICAL DATA:  Headache for 3 days EXAM: CT HEAD WITHOUT CONTRAST TECHNIQUE: Contiguous axial images were obtained from the base of the skull through the vertex without intravenous contrast. COMPARISON:  brain MRI 07/30/2015 FINDINGS: Brain: No mass lesion, intraparenchymal hemorrhage or  extra-axial  collection. No evidence of acute cortical infarct. There is periventricular hypoattenuation compatible with chronic microvascular disease. Vascular: No hyperdense vessel or unexpected calcification. Skull: Normal visualized skull base, calvarium and extracranial soft tissues. Sinuses/Orbits: No sinus fluid levels or advanced mucosal thickening. No mastoid effusion. Normal orbits. IMPRESSION: Chronic small vessel disease without acute intracranial abnormality. Electronically Signed   By: Ulyses Jarred M.D.   On: 10/09/2017 18:28   Pertinent labs & imaging results that were available during my care of the patient were reviewed by me and considered in my medical decision making (see chart for details).  Medications Ordered in ED Medications  sodium chloride 0.225 % with sodium bicarbonate 150 mEq infusion (not administered)  ipratropium-albuterol (DUONEB) 0.5-2.5 (3) MG/3ML nebulizer solution 3 mL (3 mLs Nebulization Given 10/09/17 1815)  sodium chloride 0.9 % bolus 1,000 mL (0 mLs Intravenous Stopped 10/09/17 2000)  sodium chloride 0.9 % bolus 1,000 mL (0 mLs Intravenous Stopped 10/09/17 2300)  sodium bicarbonate injection 50 mEq (50 mEq Intravenous Given 10/09/17 2332)                                                                                                                                    Procedures Procedures CRITICAL CARE Performed by: Grayce Sessions Melvina Pangelinan Total critical care time: 55 minutes Critical care time was exclusive of separately billable procedures and treating other patients. Critical care was necessary to treat or prevent imminent or life-threatening deterioration. Critical care was time spent personally by me on the following activities: development of treatment plan with patient and/or surrogate as well as nursing, discussions with consultants, evaluation of patient's response to treatment, examination of patient, obtaining history from patient or surrogate,  ordering and performing treatments and interventions, ordering and review of laboratory studies, ordering and review of radiographic studies, pulse oximetry and re-evaluation of patient's condition.   (including critical care time)  Medical Decision Making / ED Course I have reviewed the nursing notes for this encounter and the patient's prior records (if available in EHR or on provided paperwork).    Patient with 3 days of typical chest pain.  Resolved following nitroglycerin.  EKG with mild ST depression.  Troponin negative.  Given the patient's duration of chest pain with a negative troponin it is unlikely that this is related to ACS.  Low suspicion for pulmonary embolism.  Presentation not classic for aortic dissection or esophageal perforation.  During workup patient was noted to have leukocytosis and also noted to have metabolic acidosis.  Infectious workup was initiated, chest x-ray did not reveal evidence of pneumonia.  UA without evidence of infection.  Upon further questioning, the patient patient admitted to taking 10-12 baby aspirin at least once or twice a day for the past several days for chronic back pain after running out of her narcotic medication.  ABG was obtained which revealed mixed metabolic acidosis and respiratory alkalosis concerning for salicylate overdose.  Bicarb  was given.  Salicylate level was checked and was elevated at 42.  She was started on a bicarb drip and admitted to medicine for further management.  Final Clinical Impression(s) / ED Diagnoses Final diagnoses:  Salicylate overdose, accidental or unintentional, initial encounter  [place]      This chart was dictated using voice recognition software.  Despite best efforts to proofread,  errors can occur which can change the documentation meaning.   Fatima Blank, MD 10/10/17 0040

## 2017-10-09 NOTE — ED Notes (Signed)
Per MD, pt okay to drink water.

## 2017-10-09 NOTE — ED Triage Notes (Signed)
Pt arrives via EMS from home with complaints of headache and chest tightness. Pt reported to EMS that she has been out of pain medication since 12/9 and has pain all over. Pt reports chest tightness and headache has been going on x 3 days.   110/72 Hr 90 NSR 96% 2lp, [place]- 95% RA  Pt took 648MG  ASA PTA and was given 2 NTG with EMS. Pt chest pain went from 8/10 to 0/10 after ntg. A&Ox4. NAD. VSS

## 2017-10-09 NOTE — ED Notes (Signed)
ED Provider at bedside. 

## 2017-10-09 NOTE — ED Notes (Signed)
Pt agitated in lobby, pt standing, concerned for falling, this RN reviewed labs, WBC 17, pts acuity increased

## 2017-10-10 DIAGNOSIS — J449 Chronic obstructive pulmonary disease, unspecified: Secondary | ICD-10-CM | POA: Diagnosis not present

## 2017-10-10 DIAGNOSIS — N183 Chronic kidney disease, stage 3 (moderate): Secondary | ICD-10-CM | POA: Diagnosis present

## 2017-10-10 DIAGNOSIS — T39094D Poisoning by salicylates, undetermined, subsequent encounter: Secondary | ICD-10-CM | POA: Diagnosis present

## 2017-10-10 DIAGNOSIS — E0500 Thyrotoxicosis with diffuse goiter without thyrotoxic crisis or storm: Secondary | ICD-10-CM | POA: Diagnosis not present

## 2017-10-10 DIAGNOSIS — E874 Mixed disorder of acid-base balance: Secondary | ICD-10-CM | POA: Diagnosis present

## 2017-10-10 DIAGNOSIS — E1151 Type 2 diabetes mellitus with diabetic peripheral angiopathy without gangrene: Secondary | ICD-10-CM | POA: Diagnosis present

## 2017-10-10 DIAGNOSIS — J440 Chronic obstructive pulmonary disease with acute lower respiratory infection: Secondary | ICD-10-CM | POA: Diagnosis present

## 2017-10-10 DIAGNOSIS — N1831 Chronic kidney disease, stage 3a: Secondary | ICD-10-CM | POA: Diagnosis present

## 2017-10-10 DIAGNOSIS — N171 Acute kidney failure with acute cortical necrosis: Secondary | ICD-10-CM | POA: Diagnosis present

## 2017-10-10 DIAGNOSIS — M810 Age-related osteoporosis without current pathological fracture: Secondary | ICD-10-CM | POA: Diagnosis not present

## 2017-10-10 DIAGNOSIS — T39091A Poisoning by salicylates, accidental (unintentional), initial encounter: Secondary | ICD-10-CM

## 2017-10-10 DIAGNOSIS — G9340 Encephalopathy, unspecified: Secondary | ICD-10-CM | POA: Diagnosis not present

## 2017-10-10 DIAGNOSIS — R64 Cachexia: Secondary | ICD-10-CM | POA: Diagnosis present

## 2017-10-10 DIAGNOSIS — N179 Acute kidney failure, unspecified: Secondary | ICD-10-CM

## 2017-10-10 DIAGNOSIS — R519 Headache, unspecified: Secondary | ICD-10-CM | POA: Diagnosis present

## 2017-10-10 DIAGNOSIS — E1122 Type 2 diabetes mellitus with diabetic chronic kidney disease: Secondary | ICD-10-CM | POA: Diagnosis not present

## 2017-10-10 DIAGNOSIS — E43 Unspecified severe protein-calorie malnutrition: Secondary | ICD-10-CM | POA: Diagnosis not present

## 2017-10-10 DIAGNOSIS — N189 Chronic kidney disease, unspecified: Secondary | ICD-10-CM | POA: Diagnosis not present

## 2017-10-10 DIAGNOSIS — M1990 Unspecified osteoarthritis, unspecified site: Secondary | ICD-10-CM | POA: Diagnosis not present

## 2017-10-10 DIAGNOSIS — R079 Chest pain, unspecified: Secondary | ICD-10-CM | POA: Diagnosis present

## 2017-10-10 DIAGNOSIS — J441 Chronic obstructive pulmonary disease with (acute) exacerbation: Secondary | ICD-10-CM | POA: Diagnosis not present

## 2017-10-10 DIAGNOSIS — T39094A Poisoning by salicylates, undetermined, initial encounter: Secondary | ICD-10-CM | POA: Diagnosis not present

## 2017-10-10 DIAGNOSIS — G9341 Metabolic encephalopathy: Secondary | ICD-10-CM | POA: Diagnosis not present

## 2017-10-10 DIAGNOSIS — M6281 Muscle weakness (generalized): Secondary | ICD-10-CM | POA: Diagnosis not present

## 2017-10-10 DIAGNOSIS — Z915 Personal history of self-harm: Secondary | ICD-10-CM | POA: Diagnosis not present

## 2017-10-10 DIAGNOSIS — D72825 Bandemia: Secondary | ICD-10-CM | POA: Diagnosis not present

## 2017-10-10 DIAGNOSIS — R0902 Hypoxemia: Secondary | ICD-10-CM | POA: Diagnosis not present

## 2017-10-10 DIAGNOSIS — M797 Fibromyalgia: Secondary | ICD-10-CM | POA: Diagnosis not present

## 2017-10-10 DIAGNOSIS — I129 Hypertensive chronic kidney disease with stage 1 through stage 4 chronic kidney disease, or unspecified chronic kidney disease: Secondary | ICD-10-CM | POA: Diagnosis not present

## 2017-10-10 DIAGNOSIS — R6510 Systemic inflammatory response syndrome (SIRS) of non-infectious origin without acute organ dysfunction: Secondary | ICD-10-CM

## 2017-10-10 DIAGNOSIS — R0789 Other chest pain: Secondary | ICD-10-CM | POA: Diagnosis not present

## 2017-10-10 DIAGNOSIS — R51 Headache: Secondary | ICD-10-CM

## 2017-10-10 DIAGNOSIS — K219 Gastro-esophageal reflux disease without esophagitis: Secondary | ICD-10-CM | POA: Diagnosis not present

## 2017-10-10 DIAGNOSIS — R488 Other symbolic dysfunctions: Secondary | ICD-10-CM | POA: Diagnosis not present

## 2017-10-10 DIAGNOSIS — Z86718 Personal history of other venous thrombosis and embolism: Secondary | ICD-10-CM | POA: Diagnosis not present

## 2017-10-10 DIAGNOSIS — Z681 Body mass index (BMI) 19 or less, adult: Secondary | ICD-10-CM | POA: Diagnosis not present

## 2017-10-10 DIAGNOSIS — I2510 Atherosclerotic heart disease of native coronary artery without angina pectoris: Secondary | ICD-10-CM | POA: Diagnosis present

## 2017-10-10 DIAGNOSIS — R410 Disorientation, unspecified: Secondary | ICD-10-CM | POA: Diagnosis not present

## 2017-10-10 DIAGNOSIS — R262 Difficulty in walking, not elsewhere classified: Secondary | ICD-10-CM | POA: Diagnosis not present

## 2017-10-10 DIAGNOSIS — R000 Tachycardia, unspecified: Secondary | ICD-10-CM | POA: Diagnosis not present

## 2017-10-10 DIAGNOSIS — G8929 Other chronic pain: Secondary | ICD-10-CM | POA: Diagnosis not present

## 2017-10-10 DIAGNOSIS — Z87891 Personal history of nicotine dependence: Secondary | ICD-10-CM | POA: Diagnosis not present

## 2017-10-10 DIAGNOSIS — E876 Hypokalemia: Secondary | ICD-10-CM | POA: Diagnosis not present

## 2017-10-10 DIAGNOSIS — J189 Pneumonia, unspecified organism: Secondary | ICD-10-CM | POA: Diagnosis present

## 2017-10-10 DIAGNOSIS — A419 Sepsis, unspecified organism: Secondary | ICD-10-CM | POA: Diagnosis present

## 2017-10-10 LAB — CBC
HCT: 34 % — ABNORMAL LOW (ref 36.0–46.0)
Hemoglobin: 11 g/dL — ABNORMAL LOW (ref 12.0–15.0)
MCH: 30.6 pg (ref 26.0–34.0)
MCHC: 32.4 g/dL (ref 30.0–36.0)
MCV: 94.7 fL (ref 78.0–100.0)
Platelets: 177 10*3/uL (ref 150–400)
RBC: 3.59 MIL/uL — ABNORMAL LOW (ref 3.87–5.11)
RDW: 16.1 % — ABNORMAL HIGH (ref 11.5–15.5)
WBC: 14.6 10*3/uL — ABNORMAL HIGH (ref 4.0–10.5)

## 2017-10-10 LAB — BASIC METABOLIC PANEL
Anion gap: 14 (ref 5–15)
Anion gap: 10 (ref 5–15)
BUN: 35 mg/dL — ABNORMAL HIGH (ref 6–20)
BUN: 29 mg/dL — ABNORMAL HIGH (ref 6–20)
CO2: 15 mmol/L — ABNORMAL LOW (ref 22–32)
CO2: 27 mmol/L (ref 22–32)
Calcium: 6 mg/dL — CL (ref 8.9–10.3)
Calcium: 6.8 mg/dL — ABNORMAL LOW (ref 8.9–10.3)
Chloride: 114 mmol/L — ABNORMAL HIGH (ref 101–111)
Chloride: 104 mmol/L (ref 101–111)
Creatinine, Ser: 2.07 mg/dL — ABNORMAL HIGH (ref 0.44–1.00)
Creatinine, Ser: 2.07 mg/dL — ABNORMAL HIGH (ref 0.44–1.00)
GFR calc Af Amer: 26 mL/min — ABNORMAL LOW (ref >60)
GFR calc Af Amer: 26 mL/min — ABNORMAL LOW (ref >60)
GFR calc non Af Amer: 23 mL/min — ABNORMAL LOW (ref >60)
GFR calc non Af Amer: 23 mL/min — ABNORMAL LOW (ref >60)
Glucose, Bld: 98 mg/dL (ref 65–99)
Glucose, Bld: 135 mg/dL — ABNORMAL HIGH (ref 65–99)
Potassium: 3.2 mmol/L — ABNORMAL LOW (ref 3.5–5.1)
Potassium: 4.3 mmol/L (ref 3.5–5.1)
Sodium: 143 mmol/L (ref 135–145)
Sodium: 141 mmol/L (ref 135–145)

## 2017-10-10 LAB — RAPID URINE DRUG SCREEN, HOSP PERFORMED
Amphetamines: NOT DETECTED
Barbiturates: NOT DETECTED
Benzodiazepines: NOT DETECTED
Cocaine: NOT DETECTED
Opiates: NOT DETECTED
Tetrahydrocannabinol: NOT DETECTED

## 2017-10-10 LAB — SALICYLATE LEVEL
Salicylate Lvl: 46.2 mg/dL (ref 2.8–30.0)
Salicylate Lvl: 39.2 mg/dL (ref 2.8–30.0)
Salicylate Lvl: 24.5 mg/dL (ref 2.8–30.0)
Salicylate Lvl: 19.7 mg/dL (ref 2.8–30.0)

## 2017-10-10 LAB — HEPATIC FUNCTION PANEL
ALT: 10 U/L — ABNORMAL LOW (ref 14–54)
AST: 27 U/L (ref 15–41)
Albumin: 2.9 g/dL — ABNORMAL LOW (ref 3.5–5.0)
Alkaline Phosphatase: 103 U/L (ref 38–126)
Bilirubin, Direct: 0.2 mg/dL (ref 0.1–0.5)
Indirect Bilirubin: 0.5 mg/dL (ref 0.3–0.9)
Total Bilirubin: 0.7 mg/dL (ref 0.3–1.2)
Total Protein: 5.5 g/dL — ABNORMAL LOW (ref 6.5–8.1)

## 2017-10-10 LAB — MRSA PCR SCREENING: MRSA by PCR: NEGATIVE

## 2017-10-10 LAB — PROCALCITONIN: Procalcitonin: 2.13 ng/mL

## 2017-10-10 LAB — LACTIC ACID, PLASMA: Lactic Acid, Venous: 1.3 mmol/L (ref 0.5–1.9)

## 2017-10-10 LAB — ACETAMINOPHEN LEVEL: Acetaminophen (Tylenol), Serum: <10 ug/mL — ABNORMAL LOW (ref 10–30)

## 2017-10-10 LAB — TROPONIN I: Troponin I: 0.03 ng/mL (ref <0.03)

## 2017-10-10 MED ORDER — SODIUM CHLORIDE 0.9 % IV SOLN
1.0000 g | Freq: Once | INTRAVENOUS | Status: AC
  Administered 2017-10-10: 1 g via INTRAVENOUS
  Filled 2017-10-10: qty 10

## 2017-10-10 MED ORDER — VANCOMYCIN HCL IN DEXTROSE 1-5 GM/200ML-% IV SOLN: 1000.0000 mg | Freq: Once | INTRAVENOUS | Status: DC

## 2017-10-10 MED ORDER — ONDANSETRON HCL 4 MG PO TABS: 4.0000 mg | ORAL_TABLET | Freq: Four times a day (QID) | ORAL | Status: DC | PRN

## 2017-10-10 MED ORDER — ORAL CARE MOUTH RINSE
15.0000 mL | Freq: Two times a day (BID) | OROMUCOSAL | Status: DC
  Administered 2017-10-10 – 2017-10-16 (×12): 15 mL via OROMUCOSAL

## 2017-10-10 MED ORDER — PIPERACILLIN-TAZOBACTAM 3.375 G IVPB 30 MIN: 3.3750 g | Freq: Once | INTRAVENOUS | Status: DC

## 2017-10-10 MED ORDER — STERILE WATER FOR INJECTION IV SOLN
INTRAVENOUS | Status: DC
  Administered 2017-10-10 (×2): via INTRAVENOUS
  Filled 2017-10-10 (×6): qty 9.71

## 2017-10-10 MED ORDER — VANCOMYCIN HCL 500 MG IV SOLR
500.0000 mg | INTRAVENOUS | Status: DC
  Administered 2017-10-12: 500 mg via INTRAVENOUS
  Filled 2017-10-10: qty 500

## 2017-10-10 MED ORDER — IPRATROPIUM-ALBUTEROL 0.5-2.5 (3) MG/3ML IN SOLN: 3.0000 mL | RESPIRATORY_TRACT | Status: DC | PRN

## 2017-10-10 MED ORDER — VANCOMYCIN HCL IN DEXTROSE 1-5 GM/200ML-% IV SOLN
1000.0000 mg | Freq: Once | INTRAVENOUS | Status: AC
  Administered 2017-10-10: 1000 mg via INTRAVENOUS
  Filled 2017-10-10: qty 200

## 2017-10-10 MED ORDER — ONDANSETRON HCL 4 MG/2ML IJ SOLN: 4.0000 mg | Freq: Four times a day (QID) | INTRAMUSCULAR | Status: DC | PRN

## 2017-10-10 MED ORDER — ACETAMINOPHEN 325 MG PO TABS
650.0000 mg | ORAL_TABLET | Freq: Four times a day (QID) | ORAL | Status: DC | PRN
  Administered 2017-10-10 – 2017-10-13 (×4): 650 mg via ORAL
  Filled 2017-10-10 (×4): qty 2

## 2017-10-10 MED ORDER — ENSURE ENLIVE PO LIQD: 237.0000 mL | Freq: Two times a day (BID) | ORAL | Status: DC

## 2017-10-10 MED ORDER — POTASSIUM CHLORIDE CRYS ER 20 MEQ PO TBCR
40.0000 meq | EXTENDED_RELEASE_TABLET | Freq: Once | ORAL | Status: AC
  Administered 2017-10-10: 40 meq via ORAL
  Filled 2017-10-10: qty 2

## 2017-10-10 MED ORDER — HEPARIN SODIUM (PORCINE) 5000 UNIT/ML IJ SOLN
5000.0000 [IU] | Freq: Three times a day (TID) | INTRAMUSCULAR | Status: DC
  Administered 2017-10-10 – 2017-10-16 (×19): 5000 [IU] via SUBCUTANEOUS
  Filled 2017-10-10 (×19): qty 1

## 2017-10-10 MED ORDER — PIPERACILLIN-TAZOBACTAM IN DEX 2-0.25 GM/50ML IV SOLN
2.2500 g | Freq: Three times a day (TID) | INTRAVENOUS | Status: DC
  Administered 2017-10-10 – 2017-10-12 (×8): 2.25 g via INTRAVENOUS
  Filled 2017-10-10 (×9): qty 50

## 2017-10-10 MED ORDER — ACETAMINOPHEN 650 MG RE SUPP: 650.0000 mg | Freq: Four times a day (QID) | RECTAL | Status: DC | PRN

## 2017-10-10 NOTE — H&P (Addendum)
History and Physical    [name] GTX:[id number] DOB: 1946-07-05 DOA: 10/09/2017  Referring MD/NP/PA: Dr. Leonette Monarch PCP: Thressa Sheller, MD  Patient coming from: via EMS  Chief Complaint: Headache and chest pain  I have personally briefly reviewed patient's old medical records in [place]   HPI: [name] is a 71 y.o. female with medical history significant of COPD, CAD, HTN, DM type 2, CKD, Graves Dz, history of intentional overdose, h/O DVT, and tobacco abuse; who presents with complaints of headache and chest pain for the last 3 days.  She describes a global achy headache that is similar to previous headaches.  Also complains of a nonradiating substernal chest pain.  She reports being out of all her current pain medications that she normally takes on 12/9, therefore taking as many as 10 baby aspirin per day for at least 2 days to try to relieve pain symptoms.  Associated symptoms include worsening hearing loss.  Any significant nausea, vomiting, focal weakness, or change in speech.  Patient adamantly denies any thoughts of self-harm.   ED Course: Upon admission into the emergency department patient found febrile up to 100.4 F, pulse 88-97, respirations 18-25, blood pressure is 96/57-156/115, and O2 saturation 95-100.  Labs revealed WBC 17, hemoglobin 11.1, platelets 246, sodium 143, potassium 4.2, chloride 112, CO2 17, BUN 34, creatinine 2.11, calcium 6.9, glucose 104, TSH 0.105, Free T4 0.9, and salicylate level was elevated at 46.2.  Patient was given nitroglycerin with relief of chest pain but still reports continued headache.   Review of Systems  Constitutional: Positive for malaise/fatigue. Negative for chills and fever.  HENT: Positive for hearing loss. Negative for congestion.   Eyes: Negative for photophobia and pain.  Respiratory: Positive for cough. Negative for shortness of breath.   Cardiovascular: Positive for chest pain. Negative for leg swelling.    Gastrointestinal: Negative for abdominal pain, nausea and vomiting.  Genitourinary: Negative for dysuria and frequency.  Musculoskeletal: Negative for falls and neck pain.  Skin: Negative for itching and rash.  Neurological: Positive for headaches. Negative for speech change and focal weakness.  Psychiatric/Behavioral: Negative for substance abuse and suicidal ideas. The patient is nervous/anxious.     Past Medical History:  Diagnosis Date  . Angina   . Anxiety and depression   . Arthritis   . Asthma   . Blood dyscrasia    hx of thrombphlebitis  . CAD (coronary artery disease)   . Carotid artery occlusion   . Chronic kidney disease   . Chronic pain    leg and feet  . COPD (chronic obstructive pulmonary disease) ([place])   . DDD (degenerative disc disease)   . DDD (degenerative disc disease)   . Depression   . Diabetes mellitus   . Diarrhea    chronic   . Dry mouth   . DVT (deep venous thrombosis) ([place])   . Fibromyalgia   . GERD (gastroesophageal reflux disease)   . Grave's disease   . Headache(784.0)   . HTN (hypertension)   . OP (osteoporosis)   . Parkinson's disease ([place])   . Peripheral vascular disease ([place])   . PUD (peptic ulcer disease)   . Recurrent upper respiratory infection (URI)   . Rhinitis   . Shortness of breath   . Sleep apnea   . Spinal stenosis   . Spinal stenosis of lumbar region 04/23/2013  . Thrombophlebitis   . Thyroid disorder   . Tobacco use disorder 04/23/2013  . Ulcer   .  Vitamin B 12 deficiency     Past Surgical History:  Procedure Laterality Date  . CAROTID ENDARTERECTOMY Left Sept. 20,2011   cea  . CHOLECYSTECTOMY  1999  . GASTRECTOMY     age 19   Part of small intestin and part of stomach  . LEFT HEART CATHETERIZATION WITH CORONARY ANGIOGRAM N/A 10/02/2011   Procedure: LEFT HEART CATHETERIZATION WITH CORONARY ANGIOGRAM;  Surgeon: Sueanne Margarita, MD;  Location: [facility];  Service: Cardiovascular;  Laterality: N/A;     reports  that she has been smoking cigarettes.  She has a 100.00 pack-year smoking history. she has never used smokeless tobacco. She reports that she does not drink alcohol or use drugs.  No Known Allergies  Family History  Problem Relation Age of Onset  . Diabetes Mother   . Hyperlipidemia Mother   . Heart attack Mother   . Other Mother        varicose veins,respiratory,stroke  . Heart disease Mother        before age 63  . Hypertension Mother   . Varicose Veins Mother   . Diabetes Father   . Heart disease Father        before age 73  . Hyperlipidemia Father   . Heart attack Father   . Other Father        varicose veins  . Hypertension Father   . Varicose Veins Father   . Diabetes Sister   . Heart disease Sister        before age 75  . Hyperlipidemia Sister   . Heart attack Sister   . Other Sister        varicose veins  . Hypertension Sister   . Varicose Veins Sister   . Peripheral vascular disease Sister   . Diabetes Sister   . Hyperlipidemia Sister   . Hypertension Sister   . Varicose Veins Sister     Prior to Admission medications   Medication Sig Start Date End Date Taking? Authorizing Provider  acetaminophen (TYLENOL) 500 MG tablet Take 500 mg by mouth every 6 (six) hours as needed for moderate pain or headache.   Yes [provider]  aspirin 81 MG tablet Take 81 mg by mouth daily.     Yes [provider]  calcium acetate (PHOSLO) 667 MG capsule Take 667 mg by mouth 2 (two) times daily.    Yes [provider]  clonazePAM (KLONOPIN) 1 MG tablet Take 1 mg by mouth 2 (two) times daily as needed for anxiety (anxiety). For anxiety   Yes [provider]  albuterol-ipratropium (COMBIVENT) 18-103 MCG/ACT inhaler Inhale 2 puffs into the lungs every 6 (six) hours as needed for shortness of breath (sob). For shortness of breath    [provider]  amLODipine (NORVASC) 10 MG tablet Take 1 tablet (10 mg total) by mouth daily. Patient not  taking: Reported on 10/09/2017 01/28/16   Thurnell Lose, MD  DULoxetine 40 MG CPEP Take 40 mg by mouth daily. 02/02/16   Elgergawy, Silver Huguenin, MD  nicotine (NICODERM CQ - DOSED IN MG/24 HOURS) 14 mg/24hr patch Place 1 patch (14 mg total) onto the skin daily. 02/02/16   Elgergawy, Silver Huguenin, MD  nitroGLYCERIN (NITROSTAT) 0.4 MG SL tablet Place 0.4 mg under the tongue every 5 (five) minutes as needed. For chest pain     [provider]  ondansetron (ZOFRAN) 8 MG tablet Take 0.5 tablets (4 mg total) by mouth every 8 (eight) hours  as needed for nausea or vomiting. 02/02/16   Elgergawy, Silver Huguenin, MD  oxyCODONE-acetaminophen (PERCOCET/ROXICET) 5-325 MG tablet Take 1 tablet by mouth every 8 (eight) hours. 08/20/16   [provider]  pantoprazole (PROTONIX) 40 MG tablet Take 40 mg by mouth 2 (two) times daily.     [provider]  potassium chloride SA (K-DUR,KLOR-CON) 20 MEQ tablet Take 10 mEq by mouth 2 (two) times daily.  10/18/15   [provider]  predniSONE (DELTASONE) 5 MG tablet Take 5 mg by mouth every other day.     [provider]  Prenatal Vit-Fe Fumarate-FA (PRENATAL PO) Take 1 tablet by mouth daily.    [provider]  traMADol (ULTRAM) 50 MG tablet Take 1 tablet (50 mg total) by mouth every 6 (six) hours as needed for moderate pain. Patient not taking: Reported on 06/14/2017 01/28/16   Thurnell Lose, MD    Physical Exam:  Constitutional: Thin frail elderly female who appears lethargic, but is easily arousable. Vitals:   10/09/17 2315 10/09/17 2330 10/10/17 0000 10/10/17 0030  BP: (!) 115/49 (!) 139/54 (!) 122/56 (!) 128/55  Pulse: 88 92 91 88  Resp: 19 (!) 25 (!) 23 19  Temp:      TempSrc:      SpO2: 98% 97% 97% 96%  Weight:       Eyes: PERRL, lids and conjunctivae normal ENMT: Mucous membranes are dry. Posterior pharynx clear of any exudate or lesions.   Neck: normal, supple, no masses, no thyromegaly Respiratory: Mildly  tachypneic with decreased overall aeration and prolonged expiration.  No significant wheezes appreciated. Cardiovascular: Regular rate and rhythm, no murmurs / rubs / gallops. No extremity edema. 2+ pedal pulses. No carotid bruits.  Abdomen: no tenderness, no masses palpated. No hepatosplenomegaly. Bowel sounds positive.  Musculoskeletal: no clubbing / cyanosis. No joint deformity upper and lower extremities. Good ROM, no contractures. Normal muscle tone.  Skin: no rashes, lesions, ulcers. No induration Neurologic: CN 2-12 grossly intact. Sensation intact, DTR normal. Strength 5/5 in all 4.  Psychiatric: Poor judgment and insight.  Lethargic, but oriented x3.    Labs on Admission: I have personally reviewed following labs and imaging studies  CBC: Recent Labs  Lab 10/09/17 1131  WBC 17.0*  HGB 11.1*  HCT 35.2*  MCV 95.9  PLT 914   Basic Metabolic Panel: Recent Labs  Lab 10/09/17 1131  NA 143  K 4.2  CL 112*  CO2 17*  GLUCOSE 104*  BUN 34*  CREATININE 2.11*  CALCIUM 6.9*   GFR: Estimated Creatinine Clearance: 17.6 mL/min (A) (by C-G formula based on SCr of 2.11 mg/dL (H)). Liver Function Tests: No results for input(s): AST, ALT, ALKPHOS, BILITOT, PROT, ALBUMIN in the last 168 hours. No results for input(s): LIPASE, AMYLASE in the last 168 hours. Recent Labs  Lab 10/09/17 2217  AMMONIA 33   Coagulation Profile: No results for input(s): INR, PROTIME in the last 168 hours. Cardiac Enzymes: No results for input(s): CKTOTAL, CKMB, CKMBINDEX, TROPONINI in the last 168 hours. BNP (last 3 results) No results for input(s): PROBNP in the last 8760 hours. HbA1C: No results for input(s): HGBA1C in the last 72 hours. CBG: No results for input(s): GLUCAP in the last 168 hours. Lipid Profile: No results for input(s): CHOL, HDL, LDLCALC, TRIG, CHOLHDL, LDLDIRECT in the last 72 hours. Thyroid Function Tests: Recent Labs    10/09/17 1855  TSH 0.105*  FREET4 0.90   Anemia  Panel: No results for  input(s): VITAMINB12, FOLATE, FERRITIN, TIBC, IRON, RETICCTPCT in the last 72 hours. Urine analysis:    Component Value Date/Time   COLORURINE YELLOW 10/09/2017 2218   APPEARANCEUR CLEAR 10/09/2017 2218   LABSPEC 1.018 10/09/2017 2218   PHURINE 5.0 10/09/2017 2218   GLUCOSEU NEGATIVE 10/09/2017 2218   HGBUR NEGATIVE 10/09/2017 2218   BILIRUBINUR NEGATIVE 10/09/2017 2218   KETONESUR NEGATIVE 10/09/2017 2218   PROTEINUR NEGATIVE 10/09/2017 2218   UROBILINOGEN 0.2 07/28/2015 2223   NITRITE NEGATIVE 10/09/2017 2218   LEUKOCYTESUR NEGATIVE 10/09/2017 2218   Sepsis Labs: No results found for this or any previous visit (from the past 240 hour(s)).   Radiological Exams on Admission: Dg Chest 2 View  Result Date: 10/09/2017 CLINICAL DATA:  Chest pain EXAM: CHEST  2 VIEW COMPARISON:  07/29/2015 FINDINGS: There is hyperinflation of the lungs compatible with COPD. Scarring in the bases. Heart is normal size. No effusions or acute bony abnormality. IMPRESSION: COPD.  Bibasilar scarring.  No active disease. Electronically Signed   By: Rolm Baptise M.D.   On: 10/09/2017 12:00   Ct Head Wo Contrast  Result Date: 10/09/2017 CLINICAL DATA:  Headache for 3 days EXAM: CT HEAD WITHOUT CONTRAST TECHNIQUE: Contiguous axial images were obtained from the base of the skull through the vertex without intravenous contrast. COMPARISON:  brain MRI 07/30/2015 FINDINGS: Brain: No mass lesion, intraparenchymal hemorrhage or extra-axial collection. No evidence of acute cortical infarct. There is periventricular hypoattenuation compatible with chronic microvascular disease. Vascular: No hyperdense vessel or unexpected calcification. Skull: Normal visualized skull base, calvarium and extracranial soft tissues. Sinuses/Orbits: No sinus fluid levels or advanced mucosal thickening. No mastoid effusion. Normal orbits. IMPRESSION: Chronic small vessel disease without acute intracranial abnormality.  Electronically Signed   By: Ulyses Jarred M.D.   On: 10/09/2017 18:28    EKG: Independently reviewed. Sinus rhythm at 94 bpm  Assessment/Plan Salicylate toxicity: Acute.  Patient reports wanting to stop headache and chest pain, but reported being out of pain medications at that time.  Found to have a mixed respiratory alkalosis with metabolic acidosis on ABG.  Patient reports taking up to 10 or more 81 mg aspirins for 2 days.  Aspirin level noted to be 46.2 patient with previous history of suicide attempt previously. - Admit to stepdown bed - Continuous pulse oximetry with nasal cannula oxygen as needed - Sodium bicarb drip at 150 mL/h - Neurochecks. - Recheck salicylate level in a.m. - May warrant TTS eval  - Follow-up urine drug screen - Consult nephrology again in a.m. if needed  SIRS: Acute.  WBC elevated at 17 on admission with fever up to 100.4.  Suspect could be reactive to above, but unclear if possible underlying infection at this time. - Sepsis protocol initiated - Follow-up blood cultures - Follow-up lactic acid - Empirically started on antibiotics vancomycin and Zosyn  Acute renal failure on chronic kidney disease: Patient baseline creatinine previously has been on 1-1.3 per review of records, but patient presents with a creatinine of 2.11 and a BUN of 34 on admission. - Aggressive IV fluid hydration as seen above - Recheck BMP in a.m.  Chest pain: EKG showed no significant ischemic changes. - Trend cardiac troponin - Follow-up telemetry overnight  Headache -Tylenol prn  Chronic pain  - Continue patient's home pain medication  COPD, without acute exacerbation - Breathing treatments as needed  Subclinical hyperthyroidism: Review of records shows a history of Graves' disease, but patient currently not on any medications for treatment. - Continue  outpatient monitoring  DVT prophylaxis: heparin  Code Status: full  Family Communication: No family present at  bedside Disposition Plan: TBD Consults called: None Admission status: Inpatient   [name] MD [facility] Pager [phone]   If 7PM-7AM, please contact night-coverage [email or web address] Password [email or web address]  10/10/2017, 12:52 AM

## 2017-10-10 NOTE — ED Notes (Signed)
Pt assisted to use phone to call family member

## 2017-10-10 NOTE — ED Notes (Signed)
Admitting MD made aware of bladder scan and that Pt has 400 output from [place]. Plan is to hold off on catheter. Pt SPO2 dropped to high 80's and Pt placed on 1L [place].

## 2017-10-10 NOTE — ED Notes (Signed)
Dr. Horris Latino paged regarding pts minimal urine output since this RN took over a little after 7am

## 2017-10-10 NOTE — ED Notes (Addendum)
Heart Healthy diet lunch tray ordered @ 0945.

## 2017-10-10 NOTE — Progress Notes (Signed)
   Follow Up Note  HPI: Please refer to full H&P  Dated 10/10/17 by Dr Fuller Plan  Pt admitted earlier this morning for salicylate toxicity with electrolyte disturbance. Pt still c/o of mild headache, otherwise denies any chest pain, abdominal pain, N/V/D/C, fever/chills. Noted to have some urinary retension, purwick placed.  Exam: CV: S1-S2 present, no added heart sound Lungs: Chest clear bilaterally Abd: Soft, nontender, nondistended, bowel sounds present Ext: No pedal edema noted  Present on Admission: . Salicylate intoxication, undetermined intent, initial encounter . SIRS (systemic inflammatory response syndrome) ([facility]) . COPD (chronic obstructive pulmonary disease) ([place]) . Acute renal failure superimposed on chronic kidney disease ([place]) . Chest pain . Headache   Plan:  Continue present management Replaced electrolytes prn: hypocalcemia and hypokalemia, repeat BMP pending Daily BMP Consider Nephrology consult in am if oliguric/worsening AKI

## 2017-10-10 NOTE — ED Notes (Signed)
Patient is sitting up eating lunch patient has call bell in reach

## 2017-10-10 NOTE — ED Notes (Signed)
Pt a/ox4, able to answer all questions appropriately but continues to speak about things that do not make sense. Pt asking for help with her breakfast that was not at the bedside as well as trying to use call light as a telephone.

## 2017-10-10 NOTE — Significant Event (Signed)
Reviewed patient's labs and vital signs. Salicylate levels are coming down bicarb levels have increased.  Patient asymptomatic.  Discussed with poison control and patient's bicarb drip has been discontinued.  [name].

## 2017-10-10 NOTE — ED Notes (Signed)
Dr. Horris Latino contacted this RN regarding page about patients urine output, MD requesting Bladder scan on patient at this time and stated she would contact the Renal doctor. This RN advised we would document findings of bladder scan for MD to see once it was done.

## 2017-10-10 NOTE — ED Notes (Signed)
Admitting MD at bedside.

## 2017-10-10 NOTE — ED Notes (Signed)
Paged Nephrology x3

## 2017-10-10 NOTE — Progress Notes (Signed)
Pharmacy Antibiotic Note  [name] is a 71 y.o. female admitted on 10/09/2017 with sepsis.  Pharmacy has been consulted for vancomycin and zosyn dosing.  Tmax 100.4, WBC 17. SCr 2.11 for estimated CrCl 15-34mL/min.   Plan: Vancomycin 1g IV x1, then 500 mg IV q48hr Zosyn 2.25g IV q8hr Vancomycin trough at SS and PRN (goal 15-20 mcg/mL) Monitor renal function, clinical picture, and culture data F/u length of therapy   Weight: 102 lb (46.3 kg)  Temp (24hrs), Avg:98.9 F (37.2 C), Min:98 F (36.7 C), Max:100.4 F (38 C)  Recent Labs  Lab 10/09/17 1131  WBC 17.0*  CREATININE 2.11*    Estimated Creatinine Clearance: 17.6 mL/min (A) (by C-G formula based on SCr of 2.11 mg/dL (H)).    No Known Allergies  Antimicrobials this admission: 12/13 Vanc >>  12/13 Zosyn >>   Microbiology results: pending   Lavonda Jumbo, PharmD Clinical Pharmacist 10/10/17 1:47 AM

## 2017-10-10 NOTE — ED Notes (Signed)
Attempted IV start for blood draw x2, without success. Phlebotomy drawing labs now.

## 2017-10-11 ENCOUNTER — Inpatient Hospital Stay (HOSPITAL_COMMUNITY): Payer: Medicare Other

## 2017-10-11 LAB — CBC WITH DIFFERENTIAL/PLATELET
Basophils Absolute: 0 10*3/uL (ref 0.0–0.1)
Basophils Relative: 0 %
Eosinophils Absolute: 0.1 10*3/uL (ref 0.0–0.7)
Eosinophils Relative: 1 %
HCT: 31.2 % — ABNORMAL LOW (ref 36.0–46.0)
Hemoglobin: 10.2 g/dL — ABNORMAL LOW (ref 12.0–15.0)
Lymphocytes Relative: 6 %
Lymphs Abs: 0.8 10*3/uL (ref 0.7–4.0)
MCH: 30.8 pg (ref 26.0–34.0)
MCHC: 32.7 g/dL (ref 30.0–36.0)
MCV: 94.3 fL (ref 78.0–100.0)
Monocytes Absolute: 0.5 10*3/uL (ref 0.1–1.0)
Monocytes Relative: 4 %
Neutro Abs: 11.6 10*3/uL — ABNORMAL HIGH (ref 1.7–7.7)
Neutrophils Relative %: 89 %
Platelets: 172 10*3/uL (ref 150–400)
RBC: 3.31 MIL/uL — ABNORMAL LOW (ref 3.87–5.11)
RDW: 16.3 % — ABNORMAL HIGH (ref 11.5–15.5)
WBC: 12.9 10*3/uL — ABNORMAL HIGH (ref 4.0–10.5)

## 2017-10-11 LAB — BASIC METABOLIC PANEL
Anion gap: 10 (ref 5–15)
BUN: 27 mg/dL — ABNORMAL HIGH (ref 6–20)
CO2: 26 mmol/L (ref 22–32)
Calcium: 7.2 mg/dL — ABNORMAL LOW (ref 8.9–10.3)
Chloride: 105 mmol/L (ref 101–111)
Creatinine, Ser: 1.88 mg/dL — ABNORMAL HIGH (ref 0.44–1.00)
GFR calc Af Amer: 30 mL/min — ABNORMAL LOW (ref >60)
GFR calc non Af Amer: 26 mL/min — ABNORMAL LOW (ref >60)
Glucose, Bld: 101 mg/dL — ABNORMAL HIGH (ref 65–99)
Potassium: 3.8 mmol/L (ref 3.5–5.1)
Sodium: 141 mmol/L (ref 135–145)

## 2017-10-11 MED ORDER — DEXTROSE 5 % IV SOLN: 250.0000 mg | INTRAVENOUS | Status: DC

## 2017-10-11 MED ORDER — IPRATROPIUM-ALBUTEROL 0.5-2.5 (3) MG/3ML IN SOLN
3.0000 mL | RESPIRATORY_TRACT | Status: DC
  Administered 2017-10-11 (×3): 3 mL via RESPIRATORY_TRACT
  Filled 2017-10-11 (×3): qty 3

## 2017-10-11 MED ORDER — METHYLPREDNISOLONE SODIUM SUCC 125 MG IJ SOLR
60.0000 mg | Freq: Two times a day (BID) | INTRAMUSCULAR | Status: DC
  Administered 2017-10-11 – 2017-10-14 (×7): 60 mg via INTRAVENOUS
  Filled 2017-10-11 (×7): qty 2

## 2017-10-11 MED ORDER — ADULT MULTIVITAMIN W/MINERALS CH
1.0000 | ORAL_TABLET | Freq: Every day | ORAL | Status: DC
  Administered 2017-10-11 – 2017-10-16 (×6): 1 via ORAL
  Filled 2017-10-11 (×6): qty 1

## 2017-10-11 MED ORDER — DEXTROSE 5 % IV SOLN: 1.0000 g | INTRAVENOUS | Status: DC

## 2017-10-11 MED ORDER — BOOST / RESOURCE BREEZE PO LIQD CUSTOM
1.0000 | Freq: Three times a day (TID) | ORAL | Status: DC
  Administered 2017-10-11: 13:00:00 via ORAL
  Administered 2017-10-11 – 2017-10-16 (×8): 1 via ORAL

## 2017-10-11 MED ORDER — HYDRALAZINE HCL 20 MG/ML IJ SOLN
5.0000 mg | Freq: Once | INTRAMUSCULAR | Status: AC
  Administered 2017-10-11: 5 mg via INTRAVENOUS
  Filled 2017-10-11: qty 1

## 2017-10-11 NOTE — Progress Notes (Signed)
PROGRESS NOTE    [name]  MWN:[id number] DOB: [birth year] DOA: 10/09/2017 PCP: Thressa Sheller, MD   Brief Narrative:71 y.o. female with medical history significant of COPD, CAD, HTN, DM type 2, CKD, Graves Dz, history of intentional overdose, h/O DVT, and tobacco abuse; who presents with complaints of headache and chest pain for the last 3 days.  She describes a global achy headache that is similar to previous headaches.  Also complains of a nonradiating substernal chest pain.  She reports being out of all her current pain medications that she normally takes on 12/9, therefore taking as many as 10 baby aspirin per day for at least 2 days to try to relieve pain symptoms.  Associated symptoms include worsening hearing loss.  Any significant nausea, vomiting, focal weakness, or change in speech.  Patient adamantly denies any thoughts of self-harm.   ED Course: Upon admission into the emergency department patient found febrile up to 100.4 F, pulse 88-97, respirations 18-25, blood pressure is 96/57-156/115, and O2 saturation 95-100.  Labs revealed WBC 17, hemoglobin 11.1, platelets 246, sodium 143, potassium 4.2, chloride 112, CO2 17, BUN 34, creatinine 2.11, calcium 6.9, glucose 104, TSH 0.105, Free T4 0.9, and salicylate level was elevated at 46.2.  Patient was given nitroglycerin with relief of chest pain but still reports continued headache.      Assessment & Plan:   Principal Problem:   Salicylate intoxication, undetermined intent, initial encounter Active Problems:   Chest pain   COPD (chronic obstructive pulmonary disease) (HCC)   SIRS (systemic inflammatory response syndrome) (HCC)   Acute renal failure superimposed on chronic kidney disease (HCC)   Headache  Salicylate toxicity: Acute.  Patient reports wanting to stop headache and chest pain, but reported being out of pain medications at that time.  Found to have a mixed respiratory alkalosis with metabolic acidosis on ABG.   Patient reports taking up to 10 or more 81 mg aspirins for 2 days.  Aspirin level coming down .patient with previous history of suicide attempt previously. - Admit to stepdown bed - Continuous pulse oximetry with nasal cannula oxygen as needed - Sodium bicarb drip dc per poison control. - Neurochecks. - May warrant TTS eval  - Follow-up urine drug screen  SIRS: Chest x-ray done today shows changes consistent with pneumonia.  She is already on Vanco and Zosyn continue that. Acute.  WBC elevated at 17 on admission with fever up to 100.4.  Suspect could be reactive to above, but unclear if possible underlying infection at this time. - Sepsis protocol initiated - Follow-up blood cultures - Empirically-up lactic acid started on antibiotics vancomycin and Zosyn  Acute renal failure on chronic kidney disease: Patient baseline creatinine previously has been on 1-1.3 per review of records, creatinine down to 1.88. Aggressive IV fluid hydration as seen above - Recheck BMP in a.m.  Chest pain: EKG showed no significant ischemic changes. - Trend cardiac troponin - Follow-up telemetry overnight  Headache -Tylenol prn  Chronic pain  - Continue patient's home pain medication  COPD, with acute exacerbation - Breathing treatments as needed, started Solu-Medrol and continue Vanco and Zosyn.  Subclinical hyperthyroidism: Review of records shows a history of Graves' disease, but patient currently not on any medications for treatment. - Continue outpatient monitoring      DVT prophylaxis: Heparin Code Status: Full code Family Communication: No family at bedside Disposition Plan: TBD  Consultants: None   Procedures: None  Antimicrobials: Vanco and Zosyn Subjective:, Trying to take  off the mask.   Objective: Resting in bed on nonrebreather oxygen Vitals:   10/11/17 0757 10/11/17 1132 10/11/17 1201 10/11/17 1415  BP: (!) 152/104  (!) 147/65   Pulse: 92 (!) 102 (!) 101   Resp:   (!) 24    Temp: 99.3 F (37.4 C)  99.3 F (37.4 C)   TempSrc: Oral  Oral   SpO2: 92% 92% 91% 98%  Weight:      Height:        Intake/Output Summary (Last 24 hours) at 10/11/2017 1447 Last data filed at 10/11/2017 1416 Gross per 24 hour  Intake 1462 ml  Output 900 ml  Net 562 ml   Filed Weights   10/09/17 1132 10/11/17 0430  Weight: 46.3 kg (102 lb) 48.4 kg (106 lb 11.2 oz)    Examination:  General exam: Appears calm and comfortable  Respiratory system: Rhonchi and wheezing to auscultation. Respiratory effort normal. Cardiovascular system: S1 & S2 heard, RRR. No JVD, murmurs, rubs, gallops or clicks. No pedal edema. Gastrointestinal system: Abdomen is nondistended, soft and nontender. No organomegaly or masses felt. Normal bowel sounds heard. Central nervous system: Alert and oriented. No focal neurological deficits. Extremities: Symmetric 5 x 5 power. Skin: No rashes, lesions or ulcers Psychiatry: Judgement and insight appear normal. Mood & affect appropriate.     Data Reviewed: I have personally reviewed following labs and imaging studies  CBC: Recent Labs  Lab 10/09/17 1131 10/10/17 0238 10/11/17 0433  WBC 17.0* 14.6* 12.9*  NEUTROABS  --   --  11.6*  HGB 11.1* 11.0* 10.2*  HCT 35.2* 34.0* 31.2*  MCV 95.9 94.7 94.3  PLT 246 177 601   Basic Metabolic Panel: Recent Labs  Lab 10/09/17 1131 10/10/17 0238 10/10/17 1954 10/11/17 0433  NA 143 143 141 141  K 4.2 3.2* 4.3 3.8  CL 112* 114* 104 105  CO2 17* 15* 27 26  GLUCOSE 104* 98 135* 101*  BUN 34* 35* 29* 27*  CREATININE 2.11* 2.07* 2.07* 1.88*  CALCIUM 6.9* 6.0* 6.8* 7.2*   GFR: Estimated Creatinine Clearance: 20.7 mL/min (A) (by C-G formula based on SCr of 1.88 mg/dL (H)). Liver Function Tests: Recent Labs  Lab 10/10/17 0049  AST 27  ALT 10*  ALKPHOS 103  BILITOT 0.7  PROT 5.5*  ALBUMIN 2.9*   No results for input(s): LIPASE, AMYLASE in the last 168 hours. Recent Labs  Lab 10/09/17 2217   AMMONIA 33   Coagulation Profile: No results for input(s): INR, PROTIME in the last 168 hours. Cardiac Enzymes: Recent Labs  Lab 10/10/17 0238  TROPONINI 0.03*   BNP (last 3 results) No results for input(s): PROBNP in the last 8760 hours. HbA1C: No results for input(s): HGBA1C in the last 72 hours. CBG: No results for input(s): GLUCAP in the last 168 hours. Lipid Profile: No results for input(s): CHOL, HDL, LDLCALC, TRIG, CHOLHDL, LDLDIRECT in the last 72 hours. Thyroid Function Tests: Recent Labs    10/09/17 1855  TSH 0.105*  FREET4 0.90   Anemia Panel: No results for input(s): VITAMINB12, FOLATE, FERRITIN, TIBC, IRON, RETICCTPCT in the last 72 hours. Sepsis Labs: Recent Labs  Lab 10/10/17 0238  PROCALCITON 2.13  LATICACIDVEN 1.3    Recent Results (from the past 240 hour(s))  Culture, blood (x 2)     Status: None (Preliminary result)   Collection Time: 10/10/17  2:48 AM  Result Value Ref Range Status   Specimen Description BLOOD RIGHT ANTECUBITAL  Final   Special  Requests   Final    BOTTLES DRAWN AEROBIC AND ANAEROBIC Blood Culture adequate volume   Culture NO GROWTH 1 DAY  Final   Report Status PENDING  Incomplete  Culture, blood (x 2)     Status: None (Preliminary result)   Collection Time: 10/10/17  2:56 AM  Result Value Ref Range Status   Specimen Description BLOOD RIGHT ARM  Final   Special Requests   Final    BOTTLES DRAWN AEROBIC AND ANAEROBIC Blood Culture adequate volume   Culture NO GROWTH 1 DAY  Final   Report Status PENDING  Incomplete  MRSA PCR Screening     Status: None   Collection Time: 10/10/17  5:27 PM  Result Value Ref Range Status   MRSA by PCR NEGATIVE NEGATIVE Final    Comment:        The GeneXpert MRSA Assay (FDA approved for NASAL specimens only), is one component of a comprehensive MRSA colonization surveillance program. It is not intended to diagnose MRSA infection nor to guide or monitor treatment for MRSA infections.           Radiology Studies: Ct Head Wo Contrast  Result Date: 10/09/2017 CLINICAL DATA:  Headache for 3 days EXAM: CT HEAD WITHOUT CONTRAST TECHNIQUE: Contiguous axial images were obtained from the base of the skull through the vertex without intravenous contrast. COMPARISON:  brain MRI 07/30/2015 FINDINGS: Brain: No mass lesion, intraparenchymal hemorrhage or extra-axial collection. No evidence of acute cortical infarct. There is periventricular hypoattenuation compatible with chronic microvascular disease. Vascular: No hyperdense vessel or unexpected calcification. Skull: Normal visualized skull base, calvarium and extracranial soft tissues. Sinuses/Orbits: No sinus fluid levels or advanced mucosal thickening. No mastoid effusion. Normal orbits. IMPRESSION: Chronic small vessel disease without acute intracranial abnormality. Electronically Signed   By: Ulyses Jarred M.D.   On: 10/09/2017 18:28   Dg Chest Port 1 View  Result Date: 10/11/2017 CLINICAL DATA:  Hypoxia.  Severe back pain. EXAM: PORTABLE CHEST 1 VIEW COMPARISON:  10/09/2017 FINDINGS: Development of extensive parenchymal densities in both lungs but particularly in the left lung. There is new opacification or consolidation at the left lung base. Cannot exclude left pleural effusion. New densities at the right lung base. Negative for a pneumothorax. The trachea is midline. IMPRESSION: Development of bilateral parenchymal lung disease, left side greater than right. Differential diagnosis includes asymmetric pulmonary edema versus bilateral pneumonia. Markedly increased densities at the left lung base could represent consolidation and/or pleural fluid. Electronically Signed   By: Markus Daft M.D.   On: 10/11/2017 09:04        Scheduled Meds: . feeding supplement  1 Container Oral TID BM  . heparin  5,000 Units Subcutaneous Q8H  . ipratropium-albuterol  3 mL Nebulization Q4H  . mouth rinse  15 mL Mouth Rinse BID  . methylPREDNISolone  (SOLU-MEDROL) injection  60 mg Intravenous Q12H  . multivitamin with minerals  1 tablet Oral Daily   Continuous Infusions: . piperacillin-tazobactam (ZOSYN)  IV Stopped (10/11/17 1109)  . [START ON 10/12/2017] vancomycin       LOS: 1 day      Georgette Shell, MD [facility] If 7PM-7AM, please contact night-coverage [email or web address] Password [facility] 10/11/2017, 2:47 PM

## 2017-10-11 NOTE — ED Provider Notes (Signed)
Patient's O2 sats sustained in the 70's at 5L. Venti mask place, now sating in the 90's. Will continue to monitor.

## 2017-10-11 NOTE — Progress Notes (Addendum)
Initial Nutrition Assessment  DOCUMENTATION CODES:   Severe malnutrition in context of chronic illness, Underweight  INTERVENTION:    Boost Breeze po TID, each supplement provides 250 kcal and 9 grams of protein  MVI daily  NUTRITION DIAGNOSIS:   Severe Malnutrition related to chronic illness(COPD, Grave's DZ, CAD, CKD) as evidenced by severe muscle depletion, severe fat depletion.  GOAL:   Patient will meet greater than or equal to 90% of their needs  MONITOR:   PO intake, Supplement acceptance  REASON FOR ASSESSMENT:   Malnutrition Screening Tool    ASSESSMENT:   71 yo female with PMH of COPD, asthma, Grave's DZ, fibromyalgia, depression, sleep apnea, recurrent URI, GERD, CAD, CKD, PVD, DM, B-12 deficiency, spinal stenosis who was admitted on 38/38 with salicylate toxicity.  Patient reports poor intake for a while. She is unsure of exact amount of weight loss, but is sure she has lost "a lot." She does not like the taste of Ensure, "It tastes like milk." She is willing to try Boost Breeze juice supplement later today. Labs and medications reviewed. From review of weights in EMR, patient is chronically underweight.  NUTRITION - FOCUSED PHYSICAL EXAM:    Most Recent Value  Orbital Region  Severe depletion  Upper Arm Region  Moderate depletion  Thoracic and Lumbar Region  Severe depletion  Buccal Region  Moderate depletion  Temple Region  Severe depletion  Clavicle Bone Region  Mild depletion  Clavicle and Acromion Bone Region  Mild depletion  Scapular Bone Region  Severe depletion  Dorsal Hand  Moderate depletion  Patellar Region  Moderate depletion  Anterior Thigh Region  Moderate depletion  Posterior Calf Region  Mild depletion  Edema (RD Assessment)  None  Hair  Reviewed--thin, dull  Eyes  Reviewed  Mouth  Reviewed  Skin  Reviewed  Nails  Reviewed       Diet Order:  Diet Heart Room service appropriate? Yes; Fluid consistency: Thin  EDUCATION NEEDS:    No education needs have been identified at this time  Skin:  Skin Assessment: Reviewed RN Assessment  Last BM:  12/12  Height:   Ht Readings from Last 1 Encounters:  10/10/17 5\' 4"  (1.626 m)    Weight:   Wt Readings from Last 1 Encounters:  10/11/17 106 lb 11.2 oz (48.4 kg)    Ideal Body Weight:  54.5 kg  BMI:  Body mass index is 18.32 kg/m.  Estimated Nutritional Needs:   Kcal:  1500-1700  Protein:  70-80 gm  Fluid:  >/= 1.5 L   Molli Barrows, RD, LDN, [place] Pager [phone] After Hours Pager [phone]

## 2017-10-12 ENCOUNTER — Inpatient Hospital Stay (HOSPITAL_COMMUNITY): Payer: Medicare Other

## 2017-10-12 DIAGNOSIS — J189 Pneumonia, unspecified organism: Secondary | ICD-10-CM

## 2017-10-12 DIAGNOSIS — A419 Sepsis, unspecified organism: Principal | ICD-10-CM

## 2017-10-12 DIAGNOSIS — D72825 Bandemia: Secondary | ICD-10-CM

## 2017-10-12 LAB — GLUCOSE, CAPILLARY
Glucose-Capillary: 126 mg/dL — ABNORMAL HIGH (ref 65–99)
Glucose-Capillary: 175 mg/dL — ABNORMAL HIGH (ref 65–99)
Glucose-Capillary: 138 mg/dL — ABNORMAL HIGH (ref 65–99)
Glucose-Capillary: 104 mg/dL — ABNORMAL HIGH (ref 65–99)

## 2017-10-12 LAB — CBC WITH DIFFERENTIAL/PLATELET
Basophils Absolute: 0 10*3/uL (ref 0.0–0.1)
Basophils Relative: 0 %
Eosinophils Absolute: 0 10*3/uL (ref 0.0–0.7)
Eosinophils Relative: 0 %
HCT: 38.2 % (ref 36.0–46.0)
Hemoglobin: 12.4 g/dL (ref 12.0–15.0)
Lymphocytes Relative: 1 %
Lymphs Abs: 0.2 10*3/uL — ABNORMAL LOW (ref 0.7–4.0)
MCH: 30.3 pg (ref 26.0–34.0)
MCHC: 32.5 g/dL (ref 30.0–36.0)
MCV: 93.4 fL (ref 78.0–100.0)
Monocytes Absolute: 0.4 10*3/uL (ref 0.1–1.0)
Monocytes Relative: 3 %
Neutro Abs: 12.6 10*3/uL — ABNORMAL HIGH (ref 1.7–7.7)
Neutrophils Relative %: 96 %
Platelets: 179 10*3/uL (ref 150–400)
RBC: 4.09 MIL/uL (ref 3.87–5.11)
RDW: 15.6 % — ABNORMAL HIGH (ref 11.5–15.5)
WBC: 13.1 10*3/uL — ABNORMAL HIGH (ref 4.0–10.5)

## 2017-10-12 LAB — BASIC METABOLIC PANEL
Anion gap: 13 (ref 5–15)
BUN: 20 mg/dL (ref 6–20)
CO2: 20 mmol/L — ABNORMAL LOW (ref 22–32)
Calcium: 8.5 mg/dL — ABNORMAL LOW (ref 8.9–10.3)
Chloride: 105 mmol/L (ref 101–111)
Creatinine, Ser: 1.46 mg/dL — ABNORMAL HIGH (ref 0.44–1.00)
GFR calc Af Amer: 40 mL/min — ABNORMAL LOW (ref >60)
GFR calc non Af Amer: 35 mL/min — ABNORMAL LOW (ref >60)
Glucose, Bld: 167 mg/dL — ABNORMAL HIGH (ref 65–99)
Potassium: 3.5 mmol/L (ref 3.5–5.1)
Sodium: 138 mmol/L (ref 135–145)

## 2017-10-12 MED ORDER — SODIUM CHLORIDE 0.9 % IV SOLN: INTRAVENOUS | Status: DC

## 2017-10-12 MED ORDER — TRAMADOL HCL 50 MG PO TABS
50.0000 mg | ORAL_TABLET | Freq: Once | ORAL | Status: AC
  Administered 2017-10-12: 50 mg via ORAL
  Filled 2017-10-12: qty 1

## 2017-10-12 MED ORDER — DULOXETINE HCL 20 MG PO CPEP
40.0000 mg | ORAL_CAPSULE | Freq: Every day | ORAL | Status: DC
  Administered 2017-10-12 – 2017-10-16 (×5): 40 mg via ORAL
  Filled 2017-10-12 (×5): qty 2

## 2017-10-12 MED ORDER — AMLODIPINE BESYLATE 10 MG PO TABS
10.0000 mg | ORAL_TABLET | Freq: Every day | ORAL | Status: DC
  Administered 2017-10-12 – 2017-10-16 (×5): 10 mg via ORAL
  Filled 2017-10-12 (×5): qty 1

## 2017-10-12 MED ORDER — IPRATROPIUM-ALBUTEROL 0.5-2.5 (3) MG/3ML IN SOLN
3.0000 mL | Freq: Three times a day (TID) | RESPIRATORY_TRACT | Status: DC
  Administered 2017-10-12: 3 mL via RESPIRATORY_TRACT
  Filled 2017-10-12: qty 3

## 2017-10-12 MED ORDER — LEVALBUTEROL HCL 0.63 MG/3ML IN NEBU
0.6300 mg | INHALATION_SOLUTION | Freq: Four times a day (QID) | RESPIRATORY_TRACT | Status: DC
  Administered 2017-10-12: 0.63 mg via RESPIRATORY_TRACT
  Filled 2017-10-12: qty 3

## 2017-10-12 MED ORDER — HYDRALAZINE HCL 20 MG/ML IJ SOLN
10.0000 mg | Freq: Four times a day (QID) | INTRAMUSCULAR | Status: DC | PRN
  Administered 2017-10-12 – 2017-10-15 (×5): 10 mg via INTRAVENOUS
  Filled 2017-10-12 (×5): qty 1

## 2017-10-12 MED ORDER — HYDRALAZINE HCL 20 MG/ML IJ SOLN
5.0000 mg | Freq: Once | INTRAMUSCULAR | Status: AC
  Administered 2017-10-12: 5 mg via INTRAVENOUS
  Filled 2017-10-12: qty 1

## 2017-10-12 MED ORDER — CLONAZEPAM 0.5 MG PO TABS: 1.0000 mg | ORAL_TABLET | Freq: Every day | ORAL | Status: DC | PRN

## 2017-10-12 MED ORDER — NICOTINE 14 MG/24HR TD PT24
14.0000 mg | MEDICATED_PATCH | Freq: Every day | TRANSDERMAL | Status: DC
  Administered 2017-10-12 – 2017-10-16 (×5): 14 mg via TRANSDERMAL
  Filled 2017-10-12 (×5): qty 1

## 2017-10-12 MED ORDER — LEVALBUTEROL HCL 0.63 MG/3ML IN NEBU
0.6300 mg | INHALATION_SOLUTION | Freq: Three times a day (TID) | RESPIRATORY_TRACT | Status: DC
  Administered 2017-10-13 (×3): 0.63 mg via RESPIRATORY_TRACT
  Filled 2017-10-12 (×4): qty 3

## 2017-10-12 MED ORDER — CALCIUM ACETATE (PHOS BINDER) 667 MG PO CAPS
667.0000 mg | ORAL_CAPSULE | Freq: Two times a day (BID) | ORAL | Status: DC
  Administered 2017-10-12 – 2017-10-16 (×9): 667 mg via ORAL
  Filled 2017-10-12 (×10): qty 1

## 2017-10-12 MED ORDER — LEVALBUTEROL HCL 0.63 MG/3ML IN NEBU: 0.6300 mg | INHALATION_SOLUTION | RESPIRATORY_TRACT | Status: DC | PRN

## 2017-10-12 MED ORDER — HYDRALAZINE HCL 20 MG/ML IJ SOLN
10.0000 mg | Freq: Once | INTRAMUSCULAR | Status: AC
  Administered 2017-10-12: 10 mg via INTRAVENOUS
  Filled 2017-10-12: qty 1

## 2017-10-12 MED ORDER — PIPERACILLIN-TAZOBACTAM 3.375 G IVPB
3.3750 g | Freq: Three times a day (TID) | INTRAVENOUS | Status: DC
  Administered 2017-10-12 – 2017-10-15 (×8): 3.375 g via INTRAVENOUS
  Filled 2017-10-12 (×9): qty 50

## 2017-10-12 MED ORDER — VANCOMYCIN HCL 500 MG IV SOLR
500.0000 mg | INTRAVENOUS | Status: DC
  Administered 2017-10-13 – 2017-10-14 (×2): 500 mg via INTRAVENOUS
  Filled 2017-10-12 (×2): qty 500

## 2017-10-12 MED ORDER — INSULIN ASPART 100 UNIT/ML ~~LOC~~ SOLN
0.0000 [IU] | Freq: Three times a day (TID) | SUBCUTANEOUS | Status: DC
  Administered 2017-10-12 – 2017-10-15 (×5): 1 [IU] via SUBCUTANEOUS

## 2017-10-12 NOTE — Progress Notes (Signed)
Pharmacy Antibiotic Note  [name] is a 71 y.o. female admitted on 10/09/2017 with sepsis.  Pharmacy has been consulted for vancomycin and zosyn dosing.  Renal function improving, trending down to baseline with SCr 2.11>>1.46 and good UOP.  Leukocytosis and pt remains afebrile.    Plan: Increase vancomycin to 500mg  IV every 24 hours Increase zosyn to 3.375 g IV every 8 hours Vancomycin trough at SS and PRN (goal 15-20 mcg/mL) Monitor renal function, clinical picture, and culture data F/u length of therapy   Height: 5\' 4"  (162.6 cm) Weight: 107 lb 2.3 oz (48.6 kg) IBW/kg (Calculated) : 54.7  Temp (24hrs), Avg:98.4 F (36.9 C), Min:97.5 F (36.4 C), Max:99.1 F (37.3 C)  Recent Labs  Lab 10/09/17 1131 10/10/17 0238 10/10/17 1954 10/11/17 0433 10/12/17 0510  WBC 17.0* 14.6*  --  12.9* 13.1*  CREATININE 2.11* 2.07* 2.07* 1.88* 1.46*  LATICACIDVEN  --  1.3  --   --   --     Estimated Creatinine Clearance: 26.7 mL/min (A) (by C-G formula based on SCr of 1.46 mg/dL (H)).    No Known Allergies  Antimicrobials this admission: 12/13 Vanc >>  12/13 Zosyn >>   Microbiology results:   MRSA PCR 12/13: neg Bcx x2 12/13: ngtd   Bertis Ruddy, PharmD Pharmacy Resident Pager #: [phone] 10/12/2017 1:13 PM

## 2017-10-12 NOTE — Progress Notes (Signed)
PROGRESS NOTE  [name] VEH:[id number] DOB: [birth year] DOA: 10/09/2017 PCP: Thressa Sheller, MD  HPI/Recap of past 24 hours: HPI from Dr Fuller Plan on 10/10/17 71 y.o.femalewith medical history significant ofCOPD, CAD, HTN, DM type 2, CKD, Graves Dz,history of intentional overdose, h/ODVT, and tobacco abuse;who presents with complaints of headache and chest pain for the last 3 days. She describes a global achy headache that is similar to previous headaches. Also complains of a nonradiating substernal chest pain. She reports being out of all her current pain medications that she normally takes on 12/9,therefore taking as many as 10 baby aspirin per day for at least 2 days to try to relieve pain symptoms. Associated symptoms include worsening hearing loss. Any significant nausea, vomiting, focal weakness, orchange in speech. Patient adamantly denies any thoughts of self-harm. In the ED, salicylate level elevated at 46.2, with low bicarb levels. Pt was started on bicarb drip and admitted for further management  Today, pt noted to be lethargic, sleepy with non-productive cough. Pt denied any chest pain, abdominal pain, N/V/D/C, fever/chills.   Assessment/Plan: Principal Problem:   Salicylate intoxication, undetermined intent, initial encounter Active Problems:   Chest pain   COPD (chronic obstructive pulmonary disease) (HCC)   SIRS (systemic inflammatory response syndrome) (HCC)   Acute renal failure superimposed on chronic kidney disease ([place])   Headache  #Acute Salicylate toxicity: Resolving  Patient reports taking up to 10 or more 81 mg aspirins for 2 days. Adamantly denied SI or attempt. Has a history of suicide attempt previously On presentation, mixed respiratory alkalosis with metabolic acidosis on ABG Continuous pulse oximetry with nasal cannula oxygen as needed D/C Sodium bicarb drip as per poison control UDS negative Neurochecks May consider psych  consult  #Sepsis Ongoing, likely due to CAP with underlying COPD Afebrile, tachycardic, +leukocytosis LA WNL, Procalcitonin elevated at 2.13 BC X 2, NGTD CXR: Markedly increased densities at the left lung base could represent consolidation and/or pleural fluid Continue Vanc + Zosyn  Monitor closely on SDU  #Acute metabolic encephalopathy Ongoing Likely due to sepsis Vs toxic CT head showed chronic small vessel disease without acute intracranial abnormality MRI w/o contrast: with motion degraded examination without evidence of acute abnormality. Mild-to-moderate chronic small vessel ischemic disease and cerebral atrophy Management for sepsis as above May consider neurology consult if persistent  #Acute renal failureon chronic kidney disease: Resolving Patient baseline creatinine previously has been on 1-1.3 Gentle hydration Daily BMP  #COPD,with acute exacerbation due to ??CAP IV solumedrol, duonebs scheduled/prn Continue Vanco and Zosyn  #Chest pain Resolved EKG showed no significant ischemic changes, ED trop neg Telemetry  #Headache -Tylenolprn -CT head and MRI brain as above, no acute changes  #Chronic pain - Held home pain medication, percocet, tramadol due to lethargy  #Subclinical hyperthyroidism: Review of records shows a history of Graves' disease,but patient currently not on any medications for treatment. -Continue outpatient monitoring    Code Status: Full  Family Communication: None at bedside  Disposition Plan: Once stable   Consultants:  None  Procedures:  None  Antimicrobials:  IV Vanc  IV Zosyn  DVT prophylaxis:  [place] Heparin   Objective: Vitals:   10/12/17 1005 10/12/17 1337 10/12/17 1440 10/12/17 1630  BP: (!) 185/94 (!) 175/78  (!) 145/88  Pulse: (!) 105 (!) 111  (!) 110  Resp:      Temp:  97.6 F (36.4 C)  97.8 F (36.6 C)  TempSrc:  Axillary  Axillary  SpO2: 100% 92% 92%  91%  Weight:      Height:         Intake/Output Summary (Last 24 hours) at 10/12/2017 1710 Last data filed at 10/12/2017 1428 Gross per 24 hour  Intake 417 ml  Output 950 ml  Net -533 ml   Filed Weights   10/09/17 1132 10/11/17 0430 10/12/17 0415  Weight: 46.3 kg (102 lb) 48.4 kg (106 lb 11.2 oz) 48.6 kg (107 lb 2.3 oz)    Exam:   General:  Lethargic, alert, easily arousable, not oriented  Cardiovascular: S1-S2 present, no added hrt sound  Respiratory: Diminished breath sounds bilaterally, rhonchi noted   Abdomen: Soft, non-tender, non-distended, BS present  Musculoskeletal: No pedal edema  Skin: Normal  Psychiatry: Unable to assess  Neuro: Lethargic, no focal neurologic deficit noted   Data Reviewed: CBC: Recent Labs  Lab 10/09/17 1131 10/10/17 0238 10/11/17 0433 10/12/17 0510  WBC 17.0* 14.6* 12.9* 13.1*  NEUTROABS  --   --  11.6* 12.6*  HGB 11.1* 11.0* 10.2* 12.4  HCT 35.2* 34.0* 31.2* 38.2  MCV 95.9 94.7 94.3 93.4  PLT 246 177 172 700   Basic Metabolic Panel: Recent Labs  Lab 10/09/17 1131 10/10/17 0238 10/10/17 1954 10/11/17 0433 10/12/17 0510  NA 143 143 141 141 138  K 4.2 3.2* 4.3 3.8 3.5  CL 112* 114* 104 105 105  CO2 17* 15* 27 26 20*  GLUCOSE 104* 98 135* 101* 167*  BUN 34* 35* 29* 27* 20  CREATININE 2.11* 2.07* 2.07* 1.88* 1.46*  CALCIUM 6.9* 6.0* 6.8* 7.2* 8.5*   GFR: Estimated Creatinine Clearance: 26.7 mL/min (A) (by C-G formula based on SCr of 1.46 mg/dL (H)). Liver Function Tests: Recent Labs  Lab 10/10/17 0049  AST 27  ALT 10*  ALKPHOS 103  BILITOT 0.7  PROT 5.5*  ALBUMIN 2.9*   No results for input(s): LIPASE, AMYLASE in the last 168 hours. Recent Labs  Lab 10/09/17 2217  AMMONIA 33   Coagulation Profile: No results for input(s): INR, PROTIME in the last 168 hours. Cardiac Enzymes: Recent Labs  Lab 10/10/17 0238  TROPONINI 0.03*   BNP (last 3 results) No results for input(s): PROBNP in the last 8760 hours. HbA1C: No results for  input(s): HGBA1C in the last 72 hours. CBG: Recent Labs  Lab 10/12/17 0828 10/12/17 1319 10/12/17 1636  GLUCAP 126* 175* 138*   Lipid Profile: No results for input(s): CHOL, HDL, LDLCALC, TRIG, CHOLHDL, LDLDIRECT in the last 72 hours. Thyroid Function Tests: Recent Labs    10/09/17 1855  TSH 0.105*  FREET4 0.90   Anemia Panel: No results for input(s): VITAMINB12, FOLATE, FERRITIN, TIBC, IRON, RETICCTPCT in the last 72 hours. Urine analysis:    Component Value Date/Time   COLORURINE YELLOW 10/09/2017 [place] 10/09/2017 2218   LABSPEC 1.018 10/09/2017 2218   PHURINE 5.0 10/09/2017 2218   GLUCOSEU NEGATIVE 10/09/2017 2218   HGBUR NEGATIVE 10/09/2017 2218   BILIRUBINUR NEGATIVE 10/09/2017 2218   KETONESUR NEGATIVE 10/09/2017 2218   PROTEINUR NEGATIVE 10/09/2017 2218   UROBILINOGEN 0.2 07/28/2015 2223   NITRITE NEGATIVE 10/09/2017 2218   LEUKOCYTESUR NEGATIVE 10/09/2017 2218   Sepsis Labs: @LABRCNTIP (procalcitonin:4,lacticidven:4)  ) Recent Results (from the past 240 hour(s))  Culture, blood (x 2)     Status: None (Preliminary result)   Collection Time: 10/10/17  2:48 AM  Result Value Ref Range Status   Specimen Description BLOOD RIGHT ANTECUBITAL  Final   Special Requests   Final  BOTTLES DRAWN AEROBIC AND ANAEROBIC Blood Culture adequate volume   Culture NO GROWTH 2 DAYS  Final   Report Status PENDING  Incomplete  Culture, blood (x 2)     Status: None (Preliminary result)   Collection Time: 10/10/17  2:56 AM  Result Value Ref Range Status   Specimen Description BLOOD RIGHT ARM  Final   Special Requests   Final    BOTTLES DRAWN AEROBIC AND ANAEROBIC Blood Culture adequate volume   Culture NO GROWTH 2 DAYS  Final   Report Status PENDING  Incomplete  MRSA PCR Screening     Status: None   Collection Time: 10/10/17  5:27 PM  Result Value Ref Range Status   MRSA by PCR NEGATIVE NEGATIVE Final    Comment:        The GeneXpert MRSA Assay  (FDA approved for NASAL specimens only), is one component of a comprehensive MRSA colonization surveillance program. It is not intended to diagnose MRSA infection nor to guide or monitor treatment for MRSA infections.       Studies: Mr Brain Wo Contrast  Result Date: 10/12/2017 CLINICAL DATA:  Headaches.  Salicylate toxicity. EXAM: MRI HEAD WITHOUT CONTRAST TECHNIQUE: Multiplanar, multiecho pulse sequences of the brain and surrounding structures were obtained without intravenous contrast. COMPARISON:  Head CT 10/09/2017 and MRI 07/30/2015 FINDINGS: Multiple sequences are moderately motion degraded despite repeated imaging attempts. Brain: There is no evidence of acute infarct, intracranial hemorrhage, mass, midline shift, or extra-axial fluid collection. There is mild-to-moderate central predominant cerebral atrophy. Mild periventricular white matter and moderate pontine T2 hyperintensity is nonspecific but compatible with chronic small vessel ischemic disease. There is mild T2 heterogeneity in the thalami. Vascular: Major intracranial vascular flow voids are preserved. Skull and upper cervical spine: Unremarkable bone marrow signal. Sinuses/Orbits: Bilateral cataract extraction. Small volume fluid in the right maxillary sinus. Clear mastoid air cells. Other: None. IMPRESSION: 1. Motion degraded examination without evidence of acute abnormality. 2. Mild-to-moderate chronic small vessel ischemic disease and cerebral atrophy. Electronically Signed   By: Logan Bores M.D.   On: 10/12/2017 14:05    Scheduled Meds: . amLODipine  10 mg Oral Daily  . calcium acetate  667 mg Oral BID WC  . DULoxetine  40 mg Oral Daily  . feeding supplement  1 Container Oral TID BM  . heparin  5,000 Units Subcutaneous Q8H  . insulin aspart  0-9 Units Subcutaneous TID WC  . levalbuterol  0.63 mg Nebulization Q6H  . mouth rinse  15 mL Mouth Rinse BID  . methylPREDNISolone (SOLU-MEDROL) injection  60 mg Intravenous  Q12H  . multivitamin with minerals  1 tablet Oral Daily  . nicotine  14 mg Transdermal Daily    Continuous Infusions: . piperacillin-tazobactam (ZOSYN)  IV    . [START ON 10/13/2017] vancomycin       LOS: 2 days     Alma Friendly, MD [facility]   If 7PM-7AM, please contact night-coverage [email or web address] Password [email or web address] 10/12/2017, 5:10 PM

## 2017-10-12 NOTE — Progress Notes (Signed)
Noted pt digits cool with oxygen saturations drop to 70's-80's while on venti mask that pt would not keep on. Safety mitts applied, probe switched to forehead. Weaned off venturi, now 4l, [place],Sats > 95%. Dr. Landis Gandy made aware of above. Elita Boone. BSN, Therapist, sports.

## 2017-10-12 NOTE — Progress Notes (Signed)
Paged attending MD to review pt BP trends and medications. Orders received. Demarcus Thielke. BSN, RN

## 2017-10-12 NOTE — Progress Notes (Signed)
Patient's BP remained high, systolic in the 474'Q throughout the night. Hydralazine 5 mg given x 2 per M. Donnal Debar,  NP was not effective. Will continue to monitor.

## 2017-10-13 LAB — CBC WITH DIFFERENTIAL/PLATELET
Basophils Absolute: 0 10*3/uL (ref 0.0–0.1)
Basophils Relative: 0 %
Eosinophils Absolute: 0 10*3/uL (ref 0.0–0.7)
Eosinophils Relative: 0 %
HCT: 38.2 % (ref 36.0–46.0)
Hemoglobin: 12.5 g/dL (ref 12.0–15.0)
Lymphocytes Relative: 2 %
Lymphs Abs: 0.2 10*3/uL — ABNORMAL LOW (ref 0.7–4.0)
MCH: 30.3 pg (ref 26.0–34.0)
MCHC: 32.7 g/dL (ref 30.0–36.0)
MCV: 92.7 fL (ref 78.0–100.0)
Monocytes Absolute: 0.5 10*3/uL (ref 0.1–1.0)
Monocytes Relative: 4 %
Neutro Abs: 10.2 10*3/uL — ABNORMAL HIGH (ref 1.7–7.7)
Neutrophils Relative %: 94 %
Platelets: 167 10*3/uL (ref 150–400)
RBC: 4.12 MIL/uL (ref 3.87–5.11)
RDW: 15.8 % — ABNORMAL HIGH (ref 11.5–15.5)
WBC: 10.9 10*3/uL — ABNORMAL HIGH (ref 4.0–10.5)

## 2017-10-13 LAB — BASIC METABOLIC PANEL
Anion gap: 13 (ref 5–15)
BUN: 20 mg/dL (ref 6–20)
CO2: 20 mmol/L — ABNORMAL LOW (ref 22–32)
Calcium: 8.8 mg/dL — ABNORMAL LOW (ref 8.9–10.3)
Chloride: 107 mmol/L (ref 101–111)
Creatinine, Ser: 1.18 mg/dL — ABNORMAL HIGH (ref 0.44–1.00)
GFR calc Af Amer: 52 mL/min — ABNORMAL LOW (ref >60)
GFR calc non Af Amer: 45 mL/min — ABNORMAL LOW (ref >60)
Glucose, Bld: 110 mg/dL — ABNORMAL HIGH (ref 65–99)
Potassium: 3.8 mmol/L (ref 3.5–5.1)
Sodium: 140 mmol/L (ref 135–145)

## 2017-10-13 LAB — GLUCOSE, CAPILLARY
Glucose-Capillary: 115 mg/dL — ABNORMAL HIGH (ref 65–99)
Glucose-Capillary: 120 mg/dL — ABNORMAL HIGH (ref 65–99)
Glucose-Capillary: 120 mg/dL — ABNORMAL HIGH (ref 65–99)
Glucose-Capillary: 150 mg/dL — ABNORMAL HIGH (ref 65–99)

## 2017-10-13 MED ORDER — MORPHINE SULFATE (PF) 2 MG/ML IV SOLN
2.0000 mg | INTRAVENOUS | Status: DC | PRN
  Administered 2017-10-13 – 2017-10-14 (×2): 2 mg via INTRAVENOUS
  Filled 2017-10-13 (×2): qty 1

## 2017-10-13 MED ORDER — TRAMADOL HCL 50 MG PO TABS: 50.0000 mg | ORAL_TABLET | Freq: Four times a day (QID) | ORAL | Status: DC | PRN

## 2017-10-13 MED ORDER — OXYCODONE-ACETAMINOPHEN 5-325 MG PO TABS: 1.0000 | ORAL_TABLET | Freq: Three times a day (TID) | ORAL | Status: DC | PRN

## 2017-10-13 NOTE — Progress Notes (Signed)
PROGRESS NOTE  [name] YNW:[id number] DOB: [birth year] DOA: 10/09/2017 PCP: Thressa Sheller, MD  HPI/Recap of past 24 hours: HPI from Dr Fuller Plan on 10/10/17 71 y.o.femalewith medical history significant ofCOPD, CAD, HTN, DM type 2, CKD, Graves Dz,history of intentional overdose, h/ODVT, and tobacco abuse;who presents with complaints of headache and chest pain for the last 3 days. She describes a global achy headache that is similar to previous headaches. Also complains of a nonradiating substernal chest pain. She reports being out of all her current pain medications that she normally takes on 12/9,therefore taking as many as 10 baby aspirin per day for at least 2 days to try to relieve pain symptoms. Associated symptoms include worsening hearing loss. Any significant nausea, vomiting, focal weakness, orchange in speech. Patient adamantly denies any thoughts of self-harm. In the ED, salicylate level elevated at 46.2, with low bicarb levels. Pt was started on bicarb drip and admitted for further management  Today, pt noted to be lethargic, looked like she was in distress, wincing in pain, mild non-productive cough. Pain located at her back. Hx of chronic LBP. Pt denied any chest pain, abdominal pain, N/V/D/C, fever/chills.   Assessment/Plan: Principal Problem:   Salicylate intoxication, undetermined intent, initial encounter Active Problems:   Chest pain   COPD (chronic obstructive pulmonary disease) (HCC)   SIRS (systemic inflammatory response syndrome) (HCC)   Acute renal failure superimposed on chronic kidney disease ([place])   Headache  #Acute Salicylate toxicity: Resolving  Patient reports taking up to 10 or more 81 mg aspirins for 2 days. Adamantly denied SI or attempt. Has a history of suicide attempt previously On presentation, mixed respiratory alkalosis with metabolic acidosis on ABG Continuous pulse oximetry with nasal cannula oxygen as needed D/C Sodium  bicarb drip as per poison control UDS negative Neurochecks May consider psych consult  #Sepsis Ongoing, likely due to CAP with underlying COPD Afebrile, tachycardic, resolving leukocytosis LA WNL, Procalcitonin elevated at 2.13 BC X 2, NGTD CXR: Markedly increased densities at the left lung base could represent consolidation and/or pleural fluid Continue Vanc + Zosyn  Monitor closely on SDU  #Acute metabolic encephalopathy Ongoing Likely due to sepsis Vs toxic CT head showed chronic small vessel disease without acute intracranial abnormality MRI w/o contrast: with motion degraded examination without evidence of acute abnormality. Mild-to-moderate chronic small vessel ischemic disease and cerebral atrophy Management for sepsis as above May consider neurology consult if persistent  #Acute renal failureon chronic kidney disease: Resolved Patient baseline creatinine previously has been on 1-1.3 Gentle hydration Daily BMP  #COPD,with acute exacerbation due to ??CAP IV solumedrol, duonebs scheduled/prn Continue Vanco and Zosyn  #Chest pain Resolved EKG showed no significant ischemic changes, ED trop neg Telemetry  #Headache -Tylenolprn -CT head and MRI brain as above, no acute changes  #Chronic pain - Restarted home pain medication, percocet prn and IV morphine prn  #Subclinical hyperthyroidism: TSH 0.105, Free T4 0.90 on 10/09/17 Review of records shows a history of Graves' disease,but patient currently not on any medications for treatment. -Continue outpatient monitoring    Code Status: Full  Family Communication: None at bedside  Disposition Plan: Once stable   Consultants:  None  Procedures:  None  Antimicrobials:  IV Vanc  IV Zosyn  DVT prophylaxis:  [place] Heparin   Objective: Vitals:   10/13/17 0827 10/13/17 0956 10/13/17 1221 10/13/17 1453  BP: (!) 179/86  (!) 161/73   Pulse: (!) 106  (!) 104   Resp: 20  20  Temp: 98 F  (36.7 C)  98.2 F (36.8 C)   TempSrc: Oral  Oral   SpO2: 100% 100% 100% 100%  Weight:      Height:        Intake/Output Summary (Last 24 hours) at 10/13/2017 1511 Last data filed at 10/13/2017 1236 Gross per 24 hour  Intake 440 ml  Output 1615 ml  Net -1175 ml   Filed Weights   10/11/17 0430 10/12/17 0415 10/13/17 0356  Weight: 48.4 kg (106 lb 11.2 oz) 48.6 kg (107 lb 2.3 oz) 47.5 kg (104 lb 11.5 oz)    Exam:   General:  Lethargic, alert, easily arousable, not oriented  Cardiovascular: S1-S2 present, no added hrt sound  Respiratory: Diminished breath sounds bilaterally, rhonchi noted   Abdomen: Soft, non-tender, non-distended, BS present  Musculoskeletal: No pedal edema  Skin: Normal  Psychiatry: Unable to assess  Neuro: Lethargic, no focal neurologic deficit noted   Data Reviewed: CBC: Recent Labs  Lab 10/09/17 1131 10/10/17 0238 10/11/17 0433 10/12/17 0510 10/13/17 0819  WBC 17.0* 14.6* 12.9* 13.1* 10.9*  NEUTROABS  --   --  11.6* 12.6* 10.2*  HGB 11.1* 11.0* 10.2* 12.4 12.5  HCT 35.2* 34.0* 31.2* 38.2 38.2  MCV 95.9 94.7 94.3 93.4 92.7  PLT 246 177 172 179 798   Basic Metabolic Panel: Recent Labs  Lab 10/10/17 0238 10/10/17 1954 10/11/17 0433 10/12/17 0510 10/13/17 0819  NA 143 141 141 138 140  K 3.2* 4.3 3.8 3.5 3.8  CL 114* 104 105 105 107  CO2 15* 27 26 20* 20*  GLUCOSE 98 135* 101* 167* 110*  BUN 35* 29* 27* 20 20  CREATININE 2.07* 2.07* 1.88* 1.46* 1.18*  CALCIUM 6.0* 6.8* 7.2* 8.5* 8.8*   GFR: Estimated Creatinine Clearance: 32.3 mL/min (A) (by C-G formula based on SCr of 1.18 mg/dL (H)). Liver Function Tests: Recent Labs  Lab 10/10/17 0049  AST 27  ALT 10*  ALKPHOS 103  BILITOT 0.7  PROT 5.5*  ALBUMIN 2.9*   No results for input(s): LIPASE, AMYLASE in the last 168 hours. Recent Labs  Lab 10/09/17 2217  AMMONIA 33   Coagulation Profile: No results for input(s): INR, PROTIME in the last 168 hours. Cardiac  Enzymes: Recent Labs  Lab 10/10/17 0238  TROPONINI 0.03*   BNP (last 3 results) No results for input(s): PROBNP in the last 8760 hours. HbA1C: No results for input(s): HGBA1C in the last 72 hours. CBG: Recent Labs  Lab 10/12/17 1319 10/12/17 1636 10/12/17 2124 10/13/17 0825 10/13/17 1214  GLUCAP 175* 138* 104* 115* 120*   Lipid Profile: No results for input(s): CHOL, HDL, LDLCALC, TRIG, CHOLHDL, LDLDIRECT in the last 72 hours. Thyroid Function Tests: No results for input(s): TSH, T4TOTAL, FREET4, T3FREE, THYROIDAB in the last 72 hours. Anemia Panel: No results for input(s): VITAMINB12, FOLATE, FERRITIN, TIBC, IRON, RETICCTPCT in the last 72 hours. Urine analysis:    Component Value Date/Time   COLORURINE YELLOW 10/09/2017 [place] 10/09/2017 2218   LABSPEC 1.018 10/09/2017 2218   PHURINE 5.0 10/09/2017 2218   GLUCOSEU NEGATIVE 10/09/2017 2218   HGBUR NEGATIVE 10/09/2017 2218   BILIRUBINUR NEGATIVE 10/09/2017 2218   KETONESUR NEGATIVE 10/09/2017 2218   PROTEINUR NEGATIVE 10/09/2017 2218   UROBILINOGEN 0.2 07/28/2015 2223   NITRITE NEGATIVE 10/09/2017 2218   LEUKOCYTESUR NEGATIVE 10/09/2017 2218   Sepsis Labs: @LABRCNTIP (procalcitonin:4,lacticidven:4)  ) Recent Results (from the past 240 hour(s))  Culture, blood (x 2)  Status: None (Preliminary result)   Collection Time: 10/10/17  2:48 AM  Result Value Ref Range Status   Specimen Description BLOOD RIGHT ANTECUBITAL  Final   Special Requests   Final    BOTTLES DRAWN AEROBIC AND ANAEROBIC Blood Culture adequate volume   Culture NO GROWTH 3 DAYS  Final   Report Status PENDING  Incomplete  Culture, blood (x 2)     Status: None (Preliminary result)   Collection Time: 10/10/17  2:56 AM  Result Value Ref Range Status   Specimen Description BLOOD RIGHT ARM  Final   Special Requests   Final    BOTTLES DRAWN AEROBIC AND ANAEROBIC Blood Culture adequate volume   Culture NO GROWTH 3 DAYS  Final    Report Status PENDING  Incomplete  MRSA PCR Screening     Status: None   Collection Time: 10/10/17  5:27 PM  Result Value Ref Range Status   MRSA by PCR NEGATIVE NEGATIVE Final    Comment:        The GeneXpert MRSA Assay (FDA approved for NASAL specimens only), is one component of a comprehensive MRSA colonization surveillance program. It is not intended to diagnose MRSA infection nor to guide or monitor treatment for MRSA infections.       Studies: No results found.  Scheduled Meds: . amLODipine  10 mg Oral Daily  . calcium acetate  667 mg Oral BID WC  . DULoxetine  40 mg Oral Daily  . feeding supplement  1 Container Oral TID BM  . heparin  5,000 Units Subcutaneous Q8H  . insulin aspart  0-9 Units Subcutaneous TID WC  . levalbuterol  0.63 mg Nebulization TID  . mouth rinse  15 mL Mouth Rinse BID  . methylPREDNISolone (SOLU-MEDROL) injection  60 mg Intravenous Q12H  . multivitamin with minerals  1 tablet Oral Daily  . nicotine  14 mg Transdermal Daily    Continuous Infusions: . piperacillin-tazobactam (ZOSYN)  IV 3.375 g (10/13/17 1323)  . vancomycin Stopped (10/13/17 0405)     LOS: 3 days     Alma Friendly, MD [facility]   If 7PM-7AM, please contact night-coverage [email or web address] Password [email or web address] 10/13/2017, 3:11 PM

## 2017-10-14 ENCOUNTER — Inpatient Hospital Stay (HOSPITAL_COMMUNITY): Payer: Medicare Other

## 2017-10-14 DIAGNOSIS — R410 Disorientation, unspecified: Secondary | ICD-10-CM

## 2017-10-14 DIAGNOSIS — G9340 Encephalopathy, unspecified: Secondary | ICD-10-CM

## 2017-10-14 LAB — HEMOGLOBIN A1C
Hgb A1c MFr Bld: 5.4 % (ref 4.8–5.6)
Mean Plasma Glucose: 108 mg/dL

## 2017-10-14 LAB — CBC WITH DIFFERENTIAL/PLATELET
Basophils Absolute: 0 10*3/uL (ref 0.0–0.1)
Basophils Relative: 0 %
Eosinophils Absolute: 0 10*3/uL (ref 0.0–0.7)
Eosinophils Relative: 0 %
HCT: 38.3 % (ref 36.0–46.0)
Hemoglobin: 12.6 g/dL (ref 12.0–15.0)
Lymphocytes Relative: 3 %
Lymphs Abs: 0.2 10*3/uL — ABNORMAL LOW (ref 0.7–4.0)
MCH: 30.4 pg (ref 26.0–34.0)
MCHC: 32.9 g/dL (ref 30.0–36.0)
MCV: 92.3 fL (ref 78.0–100.0)
Monocytes Absolute: 0.4 10*3/uL (ref 0.1–1.0)
Monocytes Relative: 5 %
Neutro Abs: 6.9 10*3/uL (ref 1.7–7.7)
Neutrophils Relative %: 92 %
Platelets: 187 10*3/uL (ref 150–400)
RBC: 4.15 MIL/uL (ref 3.87–5.11)
RDW: 15.8 % — ABNORMAL HIGH (ref 11.5–15.5)
WBC: 7.5 10*3/uL (ref 4.0–10.5)

## 2017-10-14 LAB — BASIC METABOLIC PANEL
Anion gap: 13 (ref 5–15)
BUN: 24 mg/dL — ABNORMAL HIGH (ref 6–20)
CO2: 21 mmol/L — ABNORMAL LOW (ref 22–32)
Calcium: 8.7 mg/dL — ABNORMAL LOW (ref 8.9–10.3)
Chloride: 108 mmol/L (ref 101–111)
Creatinine, Ser: 1.22 mg/dL — ABNORMAL HIGH (ref 0.44–1.00)
GFR calc Af Amer: 50 mL/min — ABNORMAL LOW (ref >60)
GFR calc non Af Amer: 43 mL/min — ABNORMAL LOW (ref >60)
Glucose, Bld: 118 mg/dL — ABNORMAL HIGH (ref 65–99)
Potassium: 3.8 mmol/L (ref 3.5–5.1)
Sodium: 142 mmol/L (ref 135–145)

## 2017-10-14 LAB — GLUCOSE, CAPILLARY
Glucose-Capillary: 104 mg/dL — ABNORMAL HIGH (ref 65–99)
Glucose-Capillary: 148 mg/dL — ABNORMAL HIGH (ref 65–99)
Glucose-Capillary: 111 mg/dL — ABNORMAL HIGH (ref 65–99)
Glucose-Capillary: 130 mg/dL — ABNORMAL HIGH (ref 65–99)

## 2017-10-14 LAB — VITAMIN B12: Vitamin B-12: 213 pg/mL (ref 180–914)

## 2017-10-14 MED ORDER — LEVALBUTEROL HCL 0.63 MG/3ML IN NEBU
0.6300 mg | INHALATION_SOLUTION | Freq: Three times a day (TID) | RESPIRATORY_TRACT | Status: DC
  Administered 2017-10-15: 0.63 mg via RESPIRATORY_TRACT
  Filled 2017-10-14: qty 3

## 2017-10-14 MED ORDER — THIAMINE HCL 100 MG/ML IJ SOLN
500.0000 mg | Freq: Three times a day (TID) | INTRAVENOUS | Status: DC
  Administered 2017-10-14 – 2017-10-16 (×5): 500 mg via INTRAVENOUS
  Filled 2017-10-14 (×6): qty 5

## 2017-10-14 MED ORDER — TRAMADOL HCL 50 MG PO TABS
50.0000 mg | ORAL_TABLET | Freq: Four times a day (QID) | ORAL | Status: DC | PRN
  Administered 2017-10-16: 50 mg via ORAL
  Filled 2017-10-14: qty 1

## 2017-10-14 MED ORDER — LEVALBUTEROL HCL 0.63 MG/3ML IN NEBU
0.6300 mg | INHALATION_SOLUTION | Freq: Three times a day (TID) | RESPIRATORY_TRACT | Status: DC
  Administered 2017-10-14: 0.63 mg via RESPIRATORY_TRACT
  Filled 2017-10-14: qty 3

## 2017-10-14 MED ORDER — METHYLPREDNISOLONE SODIUM SUCC 40 MG IJ SOLR
40.0000 mg | Freq: Two times a day (BID) | INTRAMUSCULAR | Status: DC
  Administered 2017-10-14 – 2017-10-15 (×2): 40 mg via INTRAVENOUS
  Filled 2017-10-14 (×2): qty 1

## 2017-10-14 NOTE — Progress Notes (Signed)
PROGRESS NOTE  [name] ZOX:[id number] DOB: [birth year] DOA: 10/09/2017 PCP: Thressa Sheller, MD  HPI/Recap of past 24 hours: HPI from Dr Fuller Plan on 10/10/17 71 y.o.femalewith medical history significant ofCOPD, CAD, HTN, DM type 2, CKD, Graves Dz,history of intentional overdose, h/ODVT, and tobacco abuse;who presents with complaints of headache and chest pain for the last 3 days. She describes a global achy headache that is similar to previous headaches. Also complains of a nonradiating substernal chest pain. She reports being out of all her current pain medications that she normally takes on 12/9,therefore taking as many as 10 baby aspirin per day for at least 2 days to try to relieve pain symptoms. Associated symptoms include worsening hearing loss. Any significant nausea, vomiting, focal weakness, orchange in speech. Patient adamantly denies any thoughts of self-harm. In the ED, salicylate level elevated at 46.2, with low bicarb levels. Pt was started on bicarb drip and admitted for further management  Today, pt noted to be more awake, still not oriented, able to have a conversation. Still with mild non-productive cough. Chronic tremors noted. Pt denied any chest pain, abdominal pain, N/V/D/C, fever/chills.   Assessment/Plan: Principal Problem:   Salicylate intoxication, undetermined intent, initial encounter Active Problems:   Chest pain   COPD (chronic obstructive pulmonary disease) (HCC)   SIRS (systemic inflammatory response syndrome) (HCC)   Acute renal failure superimposed on chronic kidney disease ([place])   Headache  #Acute Salicylate toxicity: Resolving  Patient reports taking up to 10 or more 81 mg aspirins for 2 days. Adamantly denied SI or attempt. Has a history of suicide attempt previously On presentation, mixed respiratory alkalosis with metabolic acidosis on ABG Continuous pulse oximetry with nasal cannula oxygen as needed D/C Sodium bicarb  drip as per poison control UDS negative Neurochecks May consider psych consult  #Sepsis Resolving, likely due to CAP with underlying COPD Afebrile, tachycardic, resolved leukocytosis LA WNL, Procalcitonin elevated at 2.13 BC X 2, NGTD CXR: Markedly increased densities at the left lung base could represent consolidation and/or pleural fluid Continue Zosyn, d/c vanc Monitor closely on SDU  #Acute metabolic encephalopathy Still ongoing, despite IV AB Likely due to sepsis Vs toxic CT head showed chronic small vessel disease without acute intracranial abnormality MRI w/o contrast: with motion degraded examination without evidence of acute abnormality. Mild-to-moderate chronic small vessel ischemic disease and cerebral atrophy Management for sepsis as above Neurology consulted  #Acute renal failureon chronic kidney disease: Resolved Patient baseline creatinine previously has been on 1-1.3 Gentle hydration Daily BMP  #COPD,with acute exacerbation due to ??CAP IV solumedrol, duonebs scheduled/prn Continue Zosyn  #Chest pain Resolved EKG showed no significant ischemic changes, ED trop neg Telemetry  #Headache -Tylenolprn -CT head and MRI brain as above, no acute changes  #Chronic pain - Restarted home pain medication, percocet prn and tramadol  #Subclinical hyperthyroidism: TSH 0.105, Free T4 0.90 on 10/09/17 Review of records shows a history of Graves' disease,but patient currently not on any medications for treatment. -Continue outpatient monitoring    Code Status: Full  Family Communication: None at bedside  Disposition Plan: Once stable   Consultants:  Neurology  Procedures:  None  Antimicrobials:  IV Zosyn  S/P IV Vanc  DVT prophylaxis:  [place] Heparin   Objective: Vitals:   10/14/17 0745 10/14/17 0815 10/14/17 0955 10/14/17 1208  BP: (!) 186/96 (!) 173/82 (!) 176/75 (!) 179/82  Pulse: (!) 109  82 98  Resp:      Temp: 98.1 F (36.7  C)   98.1 F (36.7 C)  TempSrc: Axillary   Oral  SpO2: 97%  94% 100%  Weight:      Height:        Intake/Output Summary (Last 24 hours) at 10/14/2017 1247 Last data filed at 10/14/2017 1100 Gross per 24 hour  Intake 628 ml  Output 1150 ml  Net -522 ml   Filed Weights   10/12/17 0415 10/13/17 0356 10/14/17 0416  Weight: 48.6 kg (107 lb 2.3 oz) 47.5 kg (104 lb 11.5 oz) 45.2 kg (99 lb 10.4 oz)    Exam:   General:  Lethargic, alert, easily arousable, not oriented, tremors noted  Cardiovascular: S1-S2 present, no added hrt sound  Respiratory: Diminished breath sounds bilaterally, rhonchi noted   Abdomen: Soft, non-tender, non-distended, BS present  Musculoskeletal: No pedal edema  Skin: Normal  Psychiatry: Unable to assess  Neuro: Lethargic, no focal neurologic deficit noted   Data Reviewed: CBC: Recent Labs  Lab 10/10/17 0238 10/11/17 0433 10/12/17 0510 10/13/17 0819 10/14/17 0404  WBC 14.6* 12.9* 13.1* 10.9* 7.5  NEUTROABS  --  11.6* 12.6* 10.2* 6.9  HGB 11.0* 10.2* 12.4 12.5 12.6  HCT 34.0* 31.2* 38.2 38.2 38.3  MCV 94.7 94.3 93.4 92.7 92.3  PLT 177 172 179 167 253   Basic Metabolic Panel: Recent Labs  Lab 10/10/17 1954 10/11/17 0433 10/12/17 0510 10/13/17 0819 10/14/17 0404  NA 141 141 138 140 142  K 4.3 3.8 3.5 3.8 3.8  CL 104 105 105 107 108  CO2 27 26 20* 20* 21*  GLUCOSE 135* 101* 167* 110* 118*  BUN 29* 27* 20 20 24*  CREATININE 2.07* 1.88* 1.46* 1.18* 1.22*  CALCIUM 6.8* 7.2* 8.5* 8.8* 8.7*   GFR: Estimated Creatinine Clearance: 29.7 mL/min (A) (by C-G formula based on SCr of 1.22 mg/dL (H)). Liver Function Tests: Recent Labs  Lab 10/10/17 0049  AST 27  ALT 10*  ALKPHOS 103  BILITOT 0.7  PROT 5.5*  ALBUMIN 2.9*   No results for input(s): LIPASE, AMYLASE in the last 168 hours. Recent Labs  Lab 10/09/17 2217  AMMONIA 33   Coagulation Profile: No results for input(s): INR, PROTIME in the last 168 hours. Cardiac  Enzymes: Recent Labs  Lab 10/10/17 0238  TROPONINI 0.03*   BNP (last 3 results) No results for input(s): PROBNP in the last 8760 hours. HbA1C: No results for input(s): HGBA1C in the last 72 hours. CBG: Recent Labs  Lab 10/13/17 1214 10/13/17 1629 10/13/17 2124 10/14/17 0754 10/14/17 1136  GLUCAP 120* 120* 150* 104* 148*   Lipid Profile: No results for input(s): CHOL, HDL, LDLCALC, TRIG, CHOLHDL, LDLDIRECT in the last 72 hours. Thyroid Function Tests: No results for input(s): TSH, T4TOTAL, FREET4, T3FREE, THYROIDAB in the last 72 hours. Anemia Panel: No results for input(s): VITAMINB12, FOLATE, FERRITIN, TIBC, IRON, RETICCTPCT in the last 72 hours. Urine analysis:    Component Value Date/Time   COLORURINE YELLOW 10/09/2017 [place] 10/09/2017 2218   LABSPEC 1.018 10/09/2017 2218   PHURINE 5.0 10/09/2017 2218   GLUCOSEU NEGATIVE 10/09/2017 2218   HGBUR NEGATIVE 10/09/2017 2218   BILIRUBINUR NEGATIVE 10/09/2017 2218   KETONESUR NEGATIVE 10/09/2017 2218   PROTEINUR NEGATIVE 10/09/2017 2218   UROBILINOGEN 0.2 07/28/2015 2223   NITRITE NEGATIVE 10/09/2017 2218   LEUKOCYTESUR NEGATIVE 10/09/2017 2218   Sepsis Labs: @LABRCNTIP (procalcitonin:4,lacticidven:4)  ) Recent Results (from the past 240 hour(s))  Culture, blood (x 2)     Status: None (Preliminary result)  Collection Time: 10/10/17  2:48 AM  Result Value Ref Range Status   Specimen Description BLOOD RIGHT ANTECUBITAL  Final   Special Requests   Final    BOTTLES DRAWN AEROBIC AND ANAEROBIC Blood Culture adequate volume   Culture NO GROWTH 3 DAYS  Final   Report Status PENDING  Incomplete  Culture, blood (x 2)     Status: None (Preliminary result)   Collection Time: 10/10/17  2:56 AM  Result Value Ref Range Status   Specimen Description BLOOD RIGHT ARM  Final   Special Requests   Final    BOTTLES DRAWN AEROBIC AND ANAEROBIC Blood Culture adequate volume   Culture NO GROWTH 3 DAYS  Final    Report Status PENDING  Incomplete  MRSA PCR Screening     Status: None   Collection Time: 10/10/17  5:27 PM  Result Value Ref Range Status   MRSA by PCR NEGATIVE NEGATIVE Final    Comment:        The GeneXpert MRSA Assay (FDA approved for NASAL specimens only), is one component of a comprehensive MRSA colonization surveillance program. It is not intended to diagnose MRSA infection nor to guide or monitor treatment for MRSA infections.       Studies: No results found.  Scheduled Meds: . amLODipine  10 mg Oral Daily  . calcium acetate  667 mg Oral BID WC  . DULoxetine  40 mg Oral Daily  . feeding supplement  1 Container Oral TID BM  . heparin  5,000 Units Subcutaneous Q8H  . insulin aspart  0-9 Units Subcutaneous TID WC  . levalbuterol  0.63 mg Nebulization Q8H  . mouth rinse  15 mL Mouth Rinse BID  . methylPREDNISolone (SOLU-MEDROL) injection  60 mg Intravenous Q12H  . multivitamin with minerals  1 tablet Oral Daily  . nicotine  14 mg Transdermal Daily    Continuous Infusions: . piperacillin-tazobactam (ZOSYN)  IV Stopped (10/14/17 1022)     LOS: 4 days     Lynn Friendly, MD [facility]   If 7PM-7AM, please contact night-coverage [email or web address] Password [facility] 10/14/2017, 12:47 PM
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
## 2017-10-14 NOTE — Care Management Important Message (Signed)
Important Message  Patient Details  Name: [name] MRN: [id number] Date of Birth: July 10, 1946   Medicare Important Message Given:  Yes    Dolce Sylvia Abena 10/14/2017, 11:06 AM

## 2017-10-14 NOTE — Progress Notes (Signed)
PT Cancellation Note  Patient Details Name: AILIS RIGAUD MRN: [id number] DOB: 01-17-1946   Cancelled Treatment:    Attempted to perform skilled PT evaluation this afternoon; unable as patient at test/procedure.   Deniece Ree PT, DPT, CBIS  Supplemental Physical Therapist [facility]
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
## 2017-10-14 NOTE — Procedures (Signed)
HPI:  71 y/o with MS change  TECHNICAL SUMMARY:  A multichannel referential and bipolar montage EEG using the standard international 10-20 system was performed on the patient described as confused.  The dominant background activity consists of 6-7 hertz activity seen most prominantly over the posterior head region.  Much overlying 3-5 Hz activity is seen.  There is a significant number of triphasic waveforms throughout the recording.  In fact, most of the recording is triphasic waveforms.  Photic stimulation and hyperventilation are not performed.  There were no spikes or paroxysmal activity.  No sleep is noted.   IMPRESSION:  This is an abnormal EEG demonstrating a moderate diffuse slowing of electrocerebral activity.  This can be seen in a wide variety of encephalopathic state including those of a toxic, metabolic, or degenerative nature.  As above, there were a significant number of triphasic sharp wave forms throughout the recording, which would indicate a metabolic etiology.  Correlate clinically.  There were no focal, hemispheric, or lateralizing features.  No epileptiform activity was recorded.

## 2017-10-14 NOTE — Progress Notes (Signed)
EEG Completed; Results Pending  

## 2017-10-14 NOTE — Consult Note (Signed)
NEURO HOSPITALIST CONSULT NOTE   Requesting physician: Dr. Horris Latino  Reason for Consult: Confusion  History obtained from:  Chart   HPI:                                                                                                                                          [name] is an 71 y.o. female who presented with a complaint of headache and chest pain. She described a global achy headache that was similar to previous headaches. Also had complained of worsening hearing loss. Did not endorse focal weakness. She had run out of all of her home pain medications on 12/9,she therefore was taking as many as 10 baby aspirin per day for at least 2 days to try to relieve pain symptoms. It was felt that she may have had an unintentional ASA overdose. Salicylate level was elevated at 46.2, with low bicarb levels also noted. She was started on a bicarb drip and admitted for further management. Other symptoms at time of admission were malaise/fatigue, cough and anxiety.   In addition to acute salicylate toxicity, she was diagnosed with SIRS and ARF on chronic kidney disease. After admission, she was also diagnosed with acute metabolic encephalopathy, likely due to sepsis versus toxic. CT head showed chronic small vessel disease without acute intracranial abnormality. MRI showed no evidence of acute abnormality; mild-to-moderate chronic small vessel ischemic disease and cerebral atrophy were noted.   Her PMHx includesCOPD, CAD, HTN, DM2, CKD, Graves Dz,history of intentional overdose, h/ODVT, and tobacco abuse.   During this admission she has continued to have impaired cognition. Today, she was noted to be more awake, but still not oriented and was not able to carry out a conversation. Neurology was consulted to further evaluate.    Past Medical History:  Diagnosis Date  . Angina   . Anxiety and depression   . Arthritis   . Asthma   . Blood dyscrasia    hx of  thrombphlebitis  . CAD (coronary artery disease)   . Carotid artery occlusion   . Chronic kidney disease   . Chronic pain    leg and feet  . COPD (chronic obstructive pulmonary disease) ([place])   . DDD (degenerative disc disease)   . DDD (degenerative disc disease)   . Depression   . Diabetes mellitus   . Diarrhea    chronic   . Dry mouth   . DVT (deep venous thrombosis) ([place])   . Fibromyalgia   . GERD (gastroesophageal reflux disease)   . Grave's disease   . Headache(784.0)   . HTN (hypertension)   . OP (osteoporosis)   . Parkinson's disease ([place])   . Peripheral vascular disease ([place])   . PUD (peptic ulcer disease)   . Recurrent upper  respiratory infection (URI)   . Rhinitis   . Shortness of breath   . Sleep apnea   . Spinal stenosis   . Spinal stenosis of lumbar region 04/23/2013  . Thrombophlebitis   . Thyroid disorder   . Tobacco use disorder 04/23/2013  . Ulcer   . Vitamin B 12 deficiency     Past Surgical History:  Procedure Laterality Date  . CAROTID ENDARTERECTOMY Left Sept. 20,2011   cea  . CHOLECYSTECTOMY  1999  . GASTRECTOMY     age 39   Part of small intestin and part of stomach  . LEFT HEART CATHETERIZATION WITH CORONARY ANGIOGRAM N/A 10/02/2011   Procedure: LEFT HEART CATHETERIZATION WITH CORONARY ANGIOGRAM;  Surgeon: Sueanne Margarita, MD;  Location: [facility];  Service: Cardiovascular;  Laterality: N/A;    Family History  Problem Relation Age of Onset  . Diabetes Mother   . Hyperlipidemia Mother   . Heart attack Mother   . Other Mother        varicose veins,respiratory,stroke  . Heart disease Mother        before age 51  . Hypertension Mother   . Varicose Veins Mother   . Diabetes Father   . Heart disease Father        before age 56  . Hyperlipidemia Father   . Heart attack Father   . Other Father        varicose veins  . Hypertension Father   . Varicose Veins Father   . Diabetes Sister   . Heart disease Sister        before age 38  .  Hyperlipidemia Sister   . Heart attack Sister   . Other Sister        varicose veins  . Hypertension Sister   . Varicose Veins Sister   . Peripheral vascular disease Sister   . Diabetes Sister   . Hyperlipidemia Sister   . Hypertension Sister   . Varicose Veins Sister    Social History:  reports that she has been smoking cigarettes.  She has a 100.00 pack-year smoking history. she has never used smokeless tobacco. She reports that she does not drink alcohol or use drugs.  No Known Allergies  MEDICATIONS:                                                                                                                     Scheduled: . amLODipine  10 mg Oral Daily  . calcium acetate  667 mg Oral BID WC  . DULoxetine  40 mg Oral Daily  . feeding supplement  1 Container Oral TID BM  . heparin  5,000 Units Subcutaneous Q8H  . insulin aspart  0-9 Units Subcutaneous TID WC  . levalbuterol  0.63 mg Nebulization Q8H  . mouth rinse  15 mL Mouth Rinse BID  . methylPREDNISolone (SOLU-MEDROL) injection  40 mg Intravenous Q12H  . multivitamin with minerals  1 tablet Oral Daily  . nicotine  14 mg  Transdermal Daily   Continuous: . piperacillin-tazobactam (ZOSYN)  IV 3.375 g (10/14/17 1307)   WER:[id number] **OR** acetaminophen, hydrALAZINE, levalbuterol, ondansetron **OR** ondansetron (ZOFRAN) IV, oxyCODONE-acetaminophen, traMADol   ROS:                                                                                                                                       Patient denies having any problems at the time of Neurology consultation.    Blood pressure (!) 179/82, pulse 98, temperature 98.1 F (36.7 C), temperature source Oral, resp. rate 18, height 5\' 4"  (1.626 m), weight 45.2 kg (99 lb 10.4 oz), SpO2 100 %.   General Examination:                                                                                                      HEENT-  [place]/AT   Lungs- Respirations  unlabored Extremities- No edema  Neurological Examination Mental Status: Awake and alert. At times with poor eye contact. At other times, after asked some questions will maintain a fixed stare with a grin directed at examiner's face without answering. Facial and vocal expressivity (prosody) varies between flat to slightly animated. Will smile occasionally. Speech fluent and non-dysarthric with intact comprehension to simple motor commands. Poor orientation and insight. Unable to provide detailed description of her symptoms.  Cranial Nerves: II: Visual fields grossly normal; no extinction to DSS.  III,IV, VI: Slow, hesitant EOM suggestive of optic ataxia; full with horizontal versions and mildly impaired with vertical gaze. No nystagmus.   VII: Smile symmetric.  VIII: Hearing intact to voice IX,X: No hypophonia XI: Symmetric  XII: midline tongue extension Motor: Decreased muscle bulk x 4 without asymmetry.  Strength is 4+/5 in all 4 extremities with poor effort at times. No asymmetry.  Tone is normal.  Sensory: Light touch intact x 4 without extinction.  Deep Tendon Reflexes: 2+ and symmetric throughout Cerebellar: No ataxia with FNF bilaterally Gait: Deferred  Lab Results: Basic Metabolic Panel: Recent Labs  Lab 10/10/17 1954 10/11/17 0433 10/12/17 0510 10/13/17 0819 10/14/17 0404  NA 141 141 138 140 142  K 4.3 3.8 3.5 3.8 3.8  CL 104 105 105 107 108  CO2 27 26 20* 20* 21*  GLUCOSE 135* 101* 167* 110* 118*  BUN 29* 27* 20 20 24*  CREATININE 2.07* 1.88* 1.46* 1.18* 1.22*  CALCIUM 6.8* 7.2* 8.5* 8.8* 8.7*    Liver Function Tests: Recent Labs  Lab 10/10/17 0049  AST 27  ALT  10*  ALKPHOS 103  BILITOT 0.7  PROT 5.5*  ALBUMIN 2.9*   No results for input(s): LIPASE, AMYLASE in the last 168 hours. Recent Labs  Lab 10/09/17 2217  AMMONIA 33    CBC: Recent Labs  Lab 10/10/17 0238 10/11/17 0433 10/12/17 0510 10/13/17 0819 10/14/17 0404  WBC 14.6* 12.9* 13.1*  10.9* 7.5  NEUTROABS  --  11.6* 12.6* 10.2* 6.9  HGB 11.0* 10.2* 12.4 12.5 12.6  HCT 34.0* 31.2* 38.2 38.2 38.3  MCV 94.7 94.3 93.4 92.7 92.3  PLT 177 172 179 167 187    Cardiac Enzymes: Recent Labs  Lab 10/10/17 0238  TROPONINI 0.03*    Lipid Panel: No results for input(s): CHOL, TRIG, HDL, CHOLHDL, VLDL, LDLCALC in the last 168 hours.  CBG: Recent Labs  Lab 10/13/17 1214 10/13/17 1629 10/13/17 2124 10/14/17 0754 10/14/17 1136  GLUCAP 120* 120* 150* 104* 148*    Microbiology: Results for orders placed or performed during the hospital encounter of 10/09/17  Culture, blood (x 2)     Status: None (Preliminary result)   Collection Time: 10/10/17  2:48 AM  Result Value Ref Range Status   Specimen Description BLOOD RIGHT ANTECUBITAL  Final   Special Requests   Final    BOTTLES DRAWN AEROBIC AND ANAEROBIC Blood Culture adequate volume   Culture NO GROWTH 3 DAYS  Final   Report Status PENDING  Incomplete  Culture, blood (x 2)     Status: None (Preliminary result)   Collection Time: 10/10/17  2:56 AM  Result Value Ref Range Status   Specimen Description BLOOD RIGHT ARM  Final   Special Requests   Final    BOTTLES DRAWN AEROBIC AND ANAEROBIC Blood Culture adequate volume   Culture NO GROWTH 3 DAYS  Final   Report Status PENDING  Incomplete  MRSA PCR Screening     Status: None   Collection Time: 10/10/17  5:27 PM  Result Value Ref Range Status   MRSA by PCR NEGATIVE NEGATIVE Final    Comment:        The GeneXpert MRSA Assay (FDA approved for NASAL specimens only), is one component of a comprehensive MRSA colonization surveillance program. It is not intended to diagnose MRSA infection nor to guide or monitor treatment for MRSA infections.     Coagulation Studies: No results for input(s): LABPROT, INR in the last 72 hours.  Imaging: Mr Brain Wo Contrast  Result Date: 10/12/2017 CLINICAL DATA:  Headaches.  Salicylate toxicity. EXAM: MRI HEAD WITHOUT CONTRAST  TECHNIQUE: Multiplanar, multiecho pulse sequences of the brain and surrounding structures were obtained without intravenous contrast. COMPARISON:  Head CT 10/09/2017 and MRI 07/30/2015 FINDINGS: Multiple sequences are moderately motion degraded despite repeated imaging attempts. Brain: There is no evidence of acute infarct, intracranial hemorrhage, mass, midline shift, or extra-axial fluid collection. There is mild-to-moderate central predominant cerebral atrophy. Mild periventricular white matter and moderate pontine T2 hyperintensity is nonspecific but compatible with chronic small vessel ischemic disease. There is mild T2 heterogeneity in the thalami. Vascular: Major intracranial vascular flow voids are preserved. Skull and upper cervical spine: Unremarkable bone marrow signal. Sinuses/Orbits: Bilateral cataract extraction. Small volume fluid in the right maxillary sinus. Clear mastoid air cells. Other: None. IMPRESSION: 1. Motion degraded examination without evidence of acute abnormality. 2. Mild-to-moderate chronic small vessel ischemic disease and cerebral atrophy. Electronically Signed   By: Logan Bores M.D.   On: 10/12/2017 14:05    Assessment: 71 year old female with cognitive impairment, possible  psychogenic or psychiatric overlay 1. MRI brain shows no acute abnormality. Diffuse cerebral atrophy and mild/moderate chronic small vessel ischemic changes are noted.  2. Ocular motility deficit in conjunction with confusion is compatible with possible Wernicke encephalopathy.  3. Other DDx for AMS includes psychogenic, mild dementia (cerebral atrophy on MRI) and B12 deficiency.  4. AST/ALT and ammonia unremarkable.  5. TSH on admit was low at 0.105 in the context of her history of Graves disease. Given history of autoimmune thyroid disease, the DDx for her impaired cognition also includes Hashimoto's encephalopathy.   Recommendations: 1. EEG 2. B12 and thiamine levels.  3. After drawing thiamine  level, start IV thiamine 500 mg TID x 3 days, then switch to 100 mg po qd.  4. Anti-thyroglobulin and anti-thyroid peroxidase antibodies.   Electronically signed: Dr. Kerney Elbe 10/14/2017, 1:20 PM

## 2017-10-14 NOTE — Progress Notes (Signed)
SLP Cancellation Note  Patient Details Name: [name] MRN: [id number] DOB: Mar 06, 1946   Cancelled treatment:       Reason Eval/Treat Not Completed: Patient at procedure or test/unavailable   Elvina Sidle, M.S., CCC-SLP 10/14/2017, 4:16 PM

## 2017-10-15 DIAGNOSIS — T39094A Poisoning by salicylates, undetermined, initial encounter: Secondary | ICD-10-CM

## 2017-10-15 LAB — CBC WITH DIFFERENTIAL/PLATELET
Basophils Absolute: 0 10*3/uL (ref 0.0–0.1)
Basophils Relative: 0 %
Eosinophils Absolute: 0 10*3/uL (ref 0.0–0.7)
Eosinophils Relative: 0 %
HCT: 37.9 % (ref 36.0–46.0)
Hemoglobin: 12.3 g/dL (ref 12.0–15.0)
Lymphocytes Relative: 10 %
Lymphs Abs: 0.5 10*3/uL — ABNORMAL LOW (ref 0.7–4.0)
MCH: 30.1 pg (ref 26.0–34.0)
MCHC: 32.5 g/dL (ref 30.0–36.0)
MCV: 92.9 fL (ref 78.0–100.0)
Monocytes Absolute: 0.2 10*3/uL (ref 0.1–1.0)
Monocytes Relative: 4 %
Neutro Abs: 4.4 10*3/uL (ref 1.7–7.7)
Neutrophils Relative %: 86 %
Platelets: 177 10*3/uL (ref 150–400)
RBC: 4.08 MIL/uL (ref 3.87–5.11)
RDW: 15.6 % — ABNORMAL HIGH (ref 11.5–15.5)
WBC: 5.2 10*3/uL (ref 4.0–10.5)

## 2017-10-15 LAB — CULTURE, BLOOD (ROUTINE X 2)
Culture: NO GROWTH
Culture: NO GROWTH
Special Requests: ADEQUATE
Special Requests: ADEQUATE

## 2017-10-15 LAB — GLUCOSE, CAPILLARY
Glucose-Capillary: 94 mg/dL (ref 65–99)
Glucose-Capillary: 122 mg/dL — ABNORMAL HIGH (ref 65–99)
Glucose-Capillary: 139 mg/dL — ABNORMAL HIGH (ref 65–99)
Glucose-Capillary: 97 mg/dL (ref 65–99)

## 2017-10-15 LAB — BASIC METABOLIC PANEL
Anion gap: 12 (ref 5–15)
BUN: 26 mg/dL — ABNORMAL HIGH (ref 6–20)
CO2: 20 mmol/L — ABNORMAL LOW (ref 22–32)
Calcium: 8.2 mg/dL — ABNORMAL LOW (ref 8.9–10.3)
Chloride: 107 mmol/L (ref 101–111)
Creatinine, Ser: 1.2 mg/dL — ABNORMAL HIGH (ref 0.44–1.00)
GFR calc Af Amer: 51 mL/min — ABNORMAL LOW (ref >60)
GFR calc non Af Amer: 44 mL/min — ABNORMAL LOW (ref >60)
Glucose, Bld: 107 mg/dL — ABNORMAL HIGH (ref 65–99)
Potassium: 3.2 mmol/L — ABNORMAL LOW (ref 3.5–5.1)
Sodium: 139 mmol/L (ref 135–145)

## 2017-10-15 LAB — PROCALCITONIN: Procalcitonin: 0.22 ng/mL

## 2017-10-15 MED ORDER — POTASSIUM CHLORIDE CRYS ER 20 MEQ PO TBCR
40.0000 meq | EXTENDED_RELEASE_TABLET | Freq: Every day | ORAL | Status: DC
  Administered 2017-10-15 – 2017-10-16 (×2): 40 meq via ORAL
  Filled 2017-10-15 (×2): qty 2

## 2017-10-15 MED ORDER — GUAIFENESIN-DM 100-10 MG/5ML PO SYRP
5.0000 mL | ORAL_SOLUTION | ORAL | Status: DC | PRN
  Administered 2017-10-15 – 2017-10-16 (×2): 5 mL via ORAL
  Filled 2017-10-15 (×2): qty 5

## 2017-10-15 MED ORDER — AMOXICILLIN-POT CLAVULANATE 500-125 MG PO TABS
1.0000 | ORAL_TABLET | Freq: Two times a day (BID) | ORAL | Status: DC
  Administered 2017-10-15 – 2017-10-16 (×3): 500 mg via ORAL
  Filled 2017-10-15 (×3): qty 1

## 2017-10-15 MED ORDER — VITAMIN B-12 1000 MCG PO TABS
2000.0000 ug | ORAL_TABLET | Freq: Every day | ORAL | Status: DC
  Administered 2017-10-15 – 2017-10-16 (×2): 2000 ug via ORAL
  Filled 2017-10-15 (×2): qty 2

## 2017-10-15 NOTE — Evaluation (Signed)
Occupational Therapy Evaluation Patient Details Name: [name] MRN: [id number] DOB: 08/08/1946 Today's Date: 10/15/2017    History of Present Illness 71 yo female reporting global headaches and non-radiating substernal chest pain, reports she has taken 10+ baby aspirin a day to try to relieve pain. Diagnosed with salicylate toxicity. PMH COPD, CAD, HTN, DM, CKD, Graves disease, hx of intentional overdose, hx of DVT, anxiety, chronic pain, Parkinsons, DDD, spinal stenosis    Clinical Impression    Pt reports she takes care of a friend who lives in her home, "Vonne." She walks with a cane and is otherwise independent, but pt unable to state how she gets her groceries or offer her bathroom set up. Pt disoriented to time and situation. Presents with poor endurance, generalized weakness and impaired balance. Pt unaware she was soiled with BM. She requires set up to max assist for ADL and min assist for mobility. Fatigued prevented pt from ambulating this visit with HR to 125 with standing.  Recommending SNF upon discharge. Will follow acutely.  Follow Up Recommendations  SNF;Supervision/Assistance - 24 hour    Equipment Recommendations       Recommendations for Other Services       Precautions / Restrictions Precautions Precautions: Fall Restrictions Weight Bearing Restrictions: No      Mobility Bed Mobility Overal bed mobility: Needs Assistance Bed Mobility: Supine to Sit;Sit to Sidelying     Supine to sit: Min assist   Sit to sidelying: Min guard General bed mobility comments: min assist to raise trunk  Transfers Overall transfer level: Needs assistance Equipment used: Rolling walker (2 wheeled) Transfers: Sit to/from Stand Sit to Stand: Min assist         General transfer comment: cues for hand placement, min assist to steady    Balance Overall balance assessment: Needs assistance   Sitting balance-Leahy Scale: Fair       Standing balance-Leahy Scale:  Poor                             ADL either performed or assessed with clinical judgement   ADL Overall ADL's : Needs assistance/impaired Eating/Feeding: Set up;Bed level   Grooming: Maximal assistance;Brushing hair;Sitting   Upper Body Bathing: Minimal assistance;Sitting   Lower Body Bathing: Maximal assistance;Sit to/from stand   Upper Body Dressing : Moderate assistance;Sitting   Lower Body Dressing: Maximal assistance;Sit to/from stand   Toilet Transfer: Minimal assistance;RW   Toileting- Clothing Manipulation and Hygiene: Maximal assistance;Sit to/from stand         General ADL Comments: pt unaware of being soiled with stool     Vision Baseline Vision/History: Wears glasses Wears Glasses: Reading only Patient Visual Report: No change from baseline       Perception     Praxis      Pertinent Vitals/Pain Pain Assessment: No/denies pain     Hand Dominance Right   Extremity/Trunk Assessment Upper Extremity Assessment Upper Extremity Assessment: Generalized weakness   Lower Extremity Assessment Lower Extremity Assessment: Generalized weakness   Cervical / Trunk Assessment Cervical / Trunk Assessment: Other exceptions(scoliosis)   Communication Communication Communication: No difficulties   Cognition Arousal/Alertness: Awake/alert Behavior During Therapy: WFL for tasks assessed/performed Overall Cognitive Status: Impaired/Different from baseline Area of Impairment: Orientation;Memory;Safety/judgement                 Orientation Level: Disoriented to;Time;Situation   Memory: Decreased short-term memory   Safety/Judgement: Decreased awareness of safety;Decreased awareness  of deficits         General Comments       Exercises     Shoulder Instructions      Home Living Family/patient expects to be discharged to:: Private residence Living Arrangements: Non-relatives/Friends   Type of Home: House Home Access: Level entry      Home Layout: One level;Other (Comment)(one step into the kitchen)     Bathroom Shower/Tub: Other (comment)(pt can't remember)   Bathroom Toilet: Standard     Home Equipment: Cane - single point;Shower seat          Prior Functioning/Environment Level of Independence: Independent with assistive device(s)        Comments: reports she walks with a cane and sits to shower, unable to give details about how she obtains groceries, pt with difficulty offering home set up        OT Problem List: Decreased strength;Decreased activity tolerance;Impaired balance (sitting and/or standing);Decreased knowledge of use of DME or AE;Decreased cognition;Decreased safety awareness      OT Treatment/Interventions: Self-care/ADL training;DME and/or AE instruction;Cognitive remediation/compensation;Balance training    OT Goals(Current goals can be found in the care plan section) Acute Rehab OT Goals Patient Stated Goal: to get better OT Goal Formulation: With patient Time For Goal Achievement: 10/29/17 Potential to Achieve Goals: Good ADL Goals Pt Will Perform Grooming: with supervision;standing Pt Will Perform Upper Body Dressing: with supervision;sitting Pt Will Perform Lower Body Dressing: with supervision;sit to/from stand Pt Will Transfer to Toilet: with supervision;ambulating;regular height toilet Pt Will Perform Toileting - Clothing Manipulation and hygiene: with supervision;sit to/from stand  OT Frequency: Min 2X/week   Barriers to D/C: Decreased caregiver support          Co-evaluation              AM-PAC PT "6 Clicks" Daily Activity     Outcome Measure Help from another person eating meals?: None Help from another person taking care of personal grooming?: A Lot Help from another person toileting, which includes using toliet, bedpan, or urinal?: A Lot Help from another person bathing (including washing, rinsing, drying)?: A Lot Help from another person to put on and  taking off regular upper body clothing?: A Little Help from another person to put on and taking off regular lower body clothing?: A Lot 6 Click Score: 15   End of Session Equipment Utilized During Treatment: Gait belt;Rolling walker  Activity Tolerance: Patient limited by fatigue Patient left: in bed;with call bell/phone within reach;with bed alarm set  OT Visit Diagnosis: Unsteadiness on feet (R26.81);Other symptoms and signs involving cognitive function                Time: 8453-6468 OT Time Calculation (min): 25 min Charges:  OT General Charges $OT Visit: 1 Visit OT Evaluation $OT Eval Moderate Complexity: 1 Mod OT Treatments $Self Care/Home Management : 8-22 mins G-Codes:     2017-11-10 Nestor Lewandowsky, OTR/L Pager: [phone]  Werner Lean, [place] November 10, 2017, 9:34 AM
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
## 2017-10-15 NOTE — Progress Notes (Signed)
10mg  of IV hydralazine given for BP of 197/86. Oncoming RN made aware and will continue to monitor closely. Jacqlyn Larsen, RN

## 2017-10-15 NOTE — Evaluation (Signed)
Physical Therapy Evaluation Patient Details Name: [name] MRN: [id number] DOB: 07-27-1946 Today's Date: 10/15/2017   History of Present Illness  71 yo female reporting global headaches and non-radiating substernal chest pain, reports she has taken 10+ baby aspirin a day to try to relieve pain. Diagnosed with salicylate toxicity. PMH COPD, CAD, HTN, DM, CKD, Graves disease, hx of intentional overdose, hx of DVT, anxiety, chronic pain, Parkinsons, DDD, spinal stenosis   Clinical Impression  Pt admitted with/for s/s and complications of salicylate toxicity above.  Pt is very fatigued at this point, needing min assist in general for basic mobility..  Pt currently limited functionally due to the problems listed. ( See problems list.)   Pt will benefit from PT to maximize function and safety in order to get ready for next venue listed below.     Follow Up Recommendations SNF;Supervision/Assistance - 24 hour    Equipment Recommendations  None recommended by PT(TBA next venue)    Recommendations for Other Services       Precautions / Restrictions Precautions Precautions: Fall Restrictions Weight Bearing Restrictions: No      Mobility  Bed Mobility Overal bed mobility: Needs Assistance Bed Mobility: Supine to Sit;Sit to Supine     Supine to sit: Min assist Sit to supine: Min guard   General bed mobility comments: slow and effortful movement, minor assist up and no assist to get back in bed  Transfers Overall transfer level: Needs assistance Equipment used: (iv pole) Transfers: Sit to/from [facility] Sit to Stand: Min assist Stand pivot transfers: Min assist       General transfer comment: cues for hand placement, min assist to steady  Ambulation/Gait             General Gait Details: pt too fatigued after standing to do extensive pericare..  So pivot transfer to/from [facility]  Stairs            Wheelchair Mobility    Modified Rankin  (Stroke Patients Only)       Balance Overall balance assessment: Needs assistance   Sitting balance-Leahy Scale: Fair       Standing balance-Leahy Scale: Poor Standing balance comment: reliant of UE assist for stability today                             Pertinent Vitals/Pain Pain Assessment: No/denies pain    Home Living Family/patient expects to be discharged to:: Private residence Living Arrangements: Non-relatives/Friends Available Help at Discharge: Friend(s) Type of Home: House Home Access: Level entry     Home Layout: One level;Other (Comment)(one step into the kitchen) Home Equipment: Cane - single point;Shower seat;Walker - 2 wheels      Prior Function Level of Independence: Independent with assistive device(s)         Comments: reports she walks with a cane and sits to shower, unable to give details about how she obtains groceries, pt with difficulty offering home set up     Hand Dominance   Dominant Hand: Right    Extremity/Trunk Assessment   Upper Extremity Assessment Upper Extremity Assessment: Generalized weakness    Lower Extremity Assessment Lower Extremity Assessment: Generalized weakness    Cervical / Trunk Assessment Cervical / Trunk Assessment: Other exceptions(scoliosis) Cervical / Trunk Exceptions: scoliotic  Communication   Communication: No difficulties  Cognition Arousal/Alertness: Awake/alert Behavior During Therapy: WFL for tasks assessed/performed Overall Cognitive Status: Impaired/Different from baseline Area of Impairment: Orientation;Memory;Safety/judgement  Orientation Level: Disoriented to;Time;Situation   Memory: Decreased short-term memory   Safety/Judgement: Decreased awareness of safety;Decreased awareness of deficits            General Comments      Exercises     Assessment/Plan    PT Assessment Patient needs continued PT services  PT Problem List Decreased  strength;Decreased activity tolerance;Decreased balance;Decreased mobility;Decreased knowledge of use of DME;Cardiopulmonary status limiting activity       PT Treatment Interventions Gait training;DME instruction;Functional mobility training;Therapeutic activities;Patient/family education    PT Goals (Current goals can be found in the Care Plan section)  Acute Rehab PT Goals Patient Stated Goal: to get better PT Goal Formulation: With patient Time For Goal Achievement: 10/29/17 Potential to Achieve Goals: Good    Frequency Min 3X/week   Barriers to discharge Decreased caregiver support      Co-evaluation               AM-PAC PT "6 Clicks" Daily Activity  Outcome Measure Difficulty turning over in bed (including adjusting bedclothes, sheets and blankets)?: A Little Difficulty moving from lying on back to sitting on the side of the bed? : Unable Difficulty sitting down on and standing up from a chair with arms (e.g., wheelchair, bedside commode, etc,.)?: Unable Help needed moving to and from a bed to chair (including a wheelchair)?: A Little Help needed walking in hospital room?: A Little Help needed climbing 3-5 steps with a railing? : A Lot 6 Click Score: 13    End of Session   Activity Tolerance: Patient tolerated treatment well;Patient limited by fatigue Patient left: in bed;with call bell/phone within reach;with bed alarm set Nurse Communication: Mobility status PT Visit Diagnosis: Unsteadiness on feet (R26.81);Muscle weakness (generalized) (M62.81)    Time: 2992-4268 PT Time Calculation (min) (ACUTE ONLY): 31 min   Charges:   PT Evaluation $PT Eval Moderate Complexity: 1 Mod PT Treatments $Therapeutic Activity: 8-22 mins   PT G Codes:        24-Oct-2017  Donnella Sham, PT [phone]  (pager)  [name] 2017/10/24, 1:46 PM

## 2017-10-15 NOTE — NC FL2 (Signed)
[place] LEVEL OF CARE SCREENING TOOL     IDENTIFICATION  Patient Name: [name] Birthdate: [birth year] Sex: female Admission Date (Current Location): 10/09/2017  [facility] and [place] Number:  Herbalist and Address:  [facility] [place], [place] [address] [place], [place] [address]      Provider Number: [id number]  Attending Physician Name and Address:  Nita Sells, MD  Relative Name and Phone Number:  [name], sister, [phone]    Current Level of Care: Hospital Recommended Level of Care: [place] Prior Approval Number:    Date Approved/Denied:   PASRR Number: [id number]  Discharge Plan: SNF    Current Diagnoses: Patient Active Problem List   Diagnosis Date Noted  . Salicylate intoxication, undetermined intent, initial encounter 10/10/2017  . Acute renal failure superimposed on chronic kidney disease ([place]) 10/10/2017  . Headache 10/10/2017  . Protein-calorie malnutrition, severe 01/26/2016  . Opiate overdose ([place]) 01/25/2016  . Suicide attempt ([place]) 01/25/2016  . Depression   . Dyslipidemia   . Gastroesophageal reflux disease without esophagitis   . UTI (lower urinary tract infection) 07/29/2015  . COPD (chronic obstructive pulmonary disease) ([place]) 07/29/2015  . Diabetes mellitus with neurological manifestation ([place]) 07/29/2015  . Frequent falls 07/29/2015  . HTN (hypertension) 07/29/2015  . AKI (acute kidney injury) ([place]) 07/29/2015  . SIRS (systemic inflammatory response syndrome) ([place]) 07/29/2015  . Aftercare following surgery of the circulatory system, [place] 11/25/2013  . Spinal stenosis of lumbar region 04/23/2013  . Diabetes mellitus ([place]) 04/23/2013  . Tremor due to multiple drugs 04/23/2013  . Tobacco use disorder 04/23/2013  . COPD exacerbation ([place]) 04/23/2013  . Occlusion and stenosis of carotid artery without mention of cerebral infarction 03/12/2012  . Viral  bronchitis-possible h. infl vs Norovirus 11/21/2011  . Chest pain 09/18/2011    Orientation RESPIRATION BLADDER Height & Weight     Self, Time, Place  Normal Indwelling catheter Weight: 96 lb 12.5 oz (43.9 kg) Height:  5\' 4"  (162.6 cm)  BEHAVIORAL SYMPTOMS/MOOD NEUROLOGICAL BOWEL NUTRITION STATUS      Incontinent Diet(heart healthy diet/ please see DC summary)  AMBULATORY STATUS COMMUNICATION OF NEEDS Skin   Extensive Assist Verbally Normal                       Personal Care Assistance Level of Assistance  Bathing, Feeding, Dressing Bathing Assistance: Limited assistance Feeding assistance: Independent Dressing Assistance: Limited assistance     Functional Limitations Info  Sight, Hearing, Speech Sight Info: Adequate Hearing Info: Adequate Speech Info: Adequate    SPECIAL CARE FACTORS FREQUENCY  PT (By licensed PT), OT (By licensed OT)     PT Frequency: 5x/week OT Frequency: 5x/week            Contractures Contractures Info: Not present    Additional Factors Info  Code Status, Allergies, Psychotropic Code Status Info: Full Allergies Info: No Known Allergies Psychotropic Info: cymbalta Insulin Sliding Scale Info: insulin 3x/day with meals       Current Medications (10/15/2017):  This is the current hospital active medication list Current Facility-Administered Medications  Medication Dose Route Frequency Provider Last Rate Last Dose  . acetaminophen (TYLENOL) tablet 650 mg  650 mg Oral Q6H PRN Fuller Plan A, MD   650 mg at 10/13/17 0442   Or  . acetaminophen (TYLENOL) suppository 650 mg  650 mg Rectal Q6H PRN [name], MD      . amLODipine (  NORVASC) tablet 10 mg  10 mg Oral Daily Alma Friendly, MD   10 mg at 10/15/17 0840  . amoxicillin-clavulanate (AUGMENTIN) 500-125 MG per tablet 500 mg  1 tablet Oral Q12H Nita Sells, MD   500 mg at 10/15/17 1040  . calcium acetate (PHOSLO) capsule 667 mg  667 mg Oral BID WC Alma Friendly, MD   667 mg at 10/15/17 0840  . DULoxetine (CYMBALTA) DR capsule 40 mg  40 mg Oral Daily Alma Friendly, MD   40 mg at 10/15/17 0843  . feeding supplement (BOOST / RESOURCE BREEZE) liquid 1 Container  1 Container Oral TID BM Cardama, Grayce Sessions, MD   1 Container at 10/15/17 1033  . heparin injection 5,000 Units  5,000 Units Subcutaneous Q8H Smith, Rondell A, MD   5,000 Units at 10/15/17 0516  . hydrALAZINE (APRESOLINE) injection 10 mg  10 mg Intravenous Q6H PRN Alma Friendly, MD   10 mg at 10/15/17 0732  . insulin aspart (novoLOG) injection 0-9 Units  0-9 Units Subcutaneous TID WC Alma Friendly, MD   1 Units at 10/15/17 1247  . levalbuterol (XOPENEX) nebulizer solution 0.63 mg  0.63 mg Nebulization Q2H PRN Alma Friendly, MD      . MEDLINE mouth rinse  15 mL Mouth Rinse BID Alma Friendly, MD   15 mL at 10/15/17 0858  . multivitamin with minerals tablet 1 tablet  1 tablet Oral Daily Cardama, Grayce Sessions, MD   1 tablet at 10/15/17 0840  . nicotine (NICODERM CQ - dosed in mg/24 hours) patch 14 mg  14 mg Transdermal Daily Alma Friendly, MD   14 mg at 10/15/17 0858  . ondansetron (ZOFRAN) tablet 4 mg  4 mg Oral Q6H PRN Fuller Plan A, MD       Or  . ondansetron (ZOFRAN) injection 4 mg  4 mg Intravenous Q6H PRN Smith, Rondell A, MD      . oxyCODONE-acetaminophen (PERCOCET/ROXICET) 5-325 MG per tablet 1 tablet  1 tablet Oral Q8H PRN Alma Friendly, MD      . potassium chloride SA (K-DUR,KLOR-CON) CR tablet 40 mEq  40 mEq Oral Daily Nita Sells, MD   40 mEq at 10/15/17 1032  . thiamine 500mg  in normal saline (4ml) IVPB  500 mg Intravenous TID Alma Friendly, MD   Stopped at 10/15/17 [phone]  . traMADol (ULTRAM) tablet 50 mg  50 mg Oral Q6H PRN Alma Friendly, MD      . vitamin B-12 (CYANOCOBALAMIN) tablet 2,000 mcg  2,000 mcg Oral Daily Kerney Elbe, MD   2,000 mcg at 10/15/17 1032     Discharge Medications: Please see  discharge summary for a list of discharge medications.  Relevant Imaging Results:  Relevant Lab Results:   Additional Information SSN: [id number]  [name], LCSW

## 2017-10-15 NOTE — Progress Notes (Signed)
PROGRESS NOTE  [name] KPT:[id number] DOB: 1946-09-19 DOA: 10/09/2017 PCP: Thressa Sheller, MD  HPI/Recap of past 24 hours:  71 y.o.female   COPD, CAD, HTN, DM type 2, CKD, Graves Dz,history of intentional overdose, h/ODVT, and tobacco abuse; who presents with complaints of headache and chest pain for the last 3 days.  She describes a global achy headache that is similar to previous headaches. Also complains of a nonradiating substernal chest pain.  She reports being out of all her current pain medications that she normally takes on 12/9,therefore taking as many as 10 baby aspirin per day for at least 2 days to try to relieve pain symptoms.  Associated symptoms include worsening hearing loss.   Patient adamantly denies any thoughts of self-harm. I   ED, salicylate level elevated at 46.2, with low bicarb levels. Pt was started on bicarb drip and admitted for further management   Assessment/Plan: Principal Problem:   Salicylate intoxication, undetermined intent, initial encounter Active Problems:   Chest pain   COPD (chronic obstructive pulmonary disease) (HCC)   SIRS (systemic inflammatory response syndrome) (HCC)   Acute renal failure superimposed on chronic kidney disease ([place])   Headache  #Acute Salicylate toxicity: Resolving  Patient reports taking up to 10 or more 81 mg aspirins for 2 days. Adamantly denied SI or attempt. Has a history of suicide attempt previously On admitmixed respiratory alkalosis with metabolic acidosis on ABG Not req oxygen right now D/C Sodium bicarb drip as per poison control UDS negative Neurochecks--Claims no rationale for OD or Suicidality at bedside 1/18--hold psych consult  #Sepsis Resolving, likely due to CAP with underlying COPD Afebrile, tachycardic, resolved leukocytosis LA WNL, Procalcitonin elevated at 2.13--not repeated so repeating now 12/18 BC X 2, NGTD CXR: Markedly increased densities at the left lung base  could represent consolidation and/or pleural fluid Continue Zosyn, d/c vanc--de-escalate to Augementin and then to off depeendant on PCT results from 17/00  #Acute metabolic encephalopathy Still ongoing, despite IV AB Likely due to sepsis Vs toxic CT head showed chronic small vessel disease without acute intracranial abnormality MRI w/o contrast: with motion degraded examination without evidence of acute abnormality. Mild-to-moderate chronic small vessel ischemic disease and cerebral atrophy Management for sepsis as above Neurology consulted--EEG ordered--wide DDX Toxic encephalopathy, degen changes  #Acute renal failureon chronic kidney disease: Resolved Patient baseline creatinine previously has been on 1-1.3 Creat now down 27/1.8 on admit to 26/1.2  #COPD,with acute exacerbation due to ??CAP IV solumedrol, duonebs scheduled/prn--d/c steorids 12/18 as no wheeze See above discussion re: Abx  #Chest pain Resolved EKG showed no significant ischemic changes, ED trop neg Telemetry is benign-shows PVC 12/18  #Headache -Tylenolprn -CT head and MRI brain as above, no acute changes  #Chronic pain - Restarted home pain medication, percocet prn and tramadol  #Subclinical hyperthyroidism: TSH 0.105, Free T4 0.90 on 10/09/17 Review of records shows a history of Graves' disease,but patient currently not on any medications for treatment. -Continue outpatient monitoring  Severe P-EM Frail and cachectic Needs OP goals and nutrition input  Code Status: Full  Family Communication: None at bedside  Disposition Plan: Once stable   Consultants:  Neurology  Procedures:  None  Antimicrobials:  IV Zosyn  S/P IV Vanc  DVT prophylaxis:  [place] Heparin  SUBJ  Awake alert dishevelled in nad Can orient to some extent-but cannot tell me what she had for breakfast Responds wasn't suicidal when asked No cp Some cough this am, Cannot remember when she had her  last  stool No fever nor chills  Objective: Vitals:   10/15/17 0732 10/15/17 0741 10/15/17 0840 10/15/17 0903  BP: (!) 197/86  (!) 183/88 (!) 172/76  Pulse:  93  88  Resp:      Temp:  98 F (36.7 C)    TempSrc:  Oral    SpO2:  94%  93%  Weight:      Height:        Intake/Output Summary (Last 24 hours) at 10/15/2017 0950 Last data filed at 10/15/2017 0716 Gross per 24 hour  Intake 967 ml  Output 1200 ml  Net -233 ml   Filed Weights   10/13/17 0356 10/14/17 0416 10/15/17 0415  Weight: 47.5 kg (104 lb 11.5 oz) 45.2 kg (99 lb 10.4 oz) 43.9 kg (96 lb 12.5 oz)    Exam:   General:  Awake easily a little confused overall  Cardiovascular: S1-S2 present, no added hrt sound  Respiratory: Diminished breath sounds L> R with More TVF  Abdomen: Soft, non-tender, non-distended, BS present  Musculoskeletal: No pedal edema  Skin: Normal  Psychiatry: awake and alert in nad--not agitated and resp[onds some what appropr but still has some diffculty oritneing  Neuro: Lethargic, no focal neurologic deficit noted   Data Reviewed: CBC: Recent Labs  Lab 10/11/17 0433 10/12/17 0510 10/13/17 0819 10/14/17 0404 10/15/17 0556  WBC 12.9* 13.1* 10.9* 7.5 5.2  NEUTROABS 11.6* 12.6* 10.2* 6.9 4.4  HGB 10.2* 12.4 12.5 12.6 12.3  HCT 31.2* 38.2 38.2 38.3 37.9  MCV 94.3 93.4 92.7 92.3 92.9  PLT 172 179 167 187 683   Basic Metabolic Panel: Recent Labs  Lab 10/11/17 0433 10/12/17 0510 10/13/17 0819 10/14/17 0404 10/15/17 0556  NA 141 138 140 142 139  K 3.8 3.5 3.8 3.8 3.2*  CL 105 105 107 108 107  CO2 26 20* 20* 21* 20*  GLUCOSE 101* 167* 110* 118* 107*  BUN 27* 20 20 24* 26*  CREATININE 1.88* 1.46* 1.18* 1.22* 1.20*  CALCIUM 7.2* 8.5* 8.8* 8.7* 8.2*   GFR: Estimated Creatinine Clearance: 29.4 mL/min (A) (by C-G formula based on SCr of 1.2 mg/dL (H)). Liver Function Tests: Recent Labs  Lab 10/10/17 0049  AST 27  ALT 10*  ALKPHOS 103  BILITOT 0.7  PROT 5.5*  ALBUMIN  2.9*   No results for input(s): LIPASE, AMYLASE in the last 168 hours. Recent Labs  Lab 10/09/17 2217  AMMONIA 33   Coagulation Profile: No results for input(s): INR, PROTIME in the last 168 hours. Cardiac Enzymes: Recent Labs  Lab 10/10/17 0238  TROPONINI 0.03*   BNP (last 3 results) No results for input(s): PROBNP in the last 8760 hours. HbA1C: Recent Labs    10/13/17 0819  HGBA1C 5.4   CBG: Recent Labs  Lab 10/14/17 0754 10/14/17 1136 10/14/17 1656 10/14/17 2042 10/15/17 0743  GLUCAP 104* 148* 111* 130* 94   Lipid Profile: No results for input(s): CHOL, HDL, LDLCALC, TRIG, CHOLHDL, LDLDIRECT in the last 72 hours. Thyroid Function Tests: No results for input(s): TSH, T4TOTAL, FREET4, T3FREE, THYROIDAB in the last 72 hours. Anemia Panel: Recent Labs    10/14/17 1831  VITAMINB12 213   Urine analysis:    Component Value Date/Time   COLORURINE YELLOW 10/09/2017 2218   APPEARANCEUR CLEAR 10/09/2017 2218   LABSPEC 1.018 10/09/2017 2218   PHURINE 5.0 10/09/2017 2218   GLUCOSEU NEGATIVE 10/09/2017 2218   [place] NEGATIVE 10/09/2017 2218   [place] 10/09/2017 [place] 10/09/2017  [place] 10/09/2017 2218   UROBILINOGEN 0.2 07/28/2015 2223   NITRITE NEGATIVE 10/09/2017 2218   LEUKOCYTESUR NEGATIVE 10/09/2017 2218   Sepsis Labs: @LABRCNTIP (procalcitonin:4,lacticidven:4)  ) Recent Results (from the past 240 hour(s))  Culture, blood (x 2)     Status: None (Preliminary result)   Collection Time: 10/10/17  2:48 AM  Result Value Ref Range Status   Specimen Description BLOOD RIGHT ANTECUBITAL  Final   Special Requests   Final    BOTTLES DRAWN AEROBIC AND ANAEROBIC Blood Culture adequate volume   Culture NO GROWTH 4 DAYS  Final   Report Status PENDING  Incomplete  Culture, blood (x 2)     Status: None (Preliminary result)   Collection Time: 10/10/17  2:56 AM  Result Value Ref Range Status   Specimen Description  BLOOD RIGHT ARM  Final   Special Requests   Final    BOTTLES DRAWN AEROBIC AND ANAEROBIC Blood Culture adequate volume   Culture NO GROWTH 4 DAYS  Final   Report Status PENDING  Incomplete  MRSA PCR Screening     Status: None   Collection Time: 10/10/17  5:27 PM  Result Value Ref Range Status   MRSA by PCR NEGATIVE NEGATIVE Final    Comment:        The GeneXpert MRSA Assay (FDA approved for NASAL specimens only), is one component of a comprehensive MRSA colonization surveillance program. It is not intended to diagnose MRSA infection nor to guide or monitor treatment for MRSA infections.       Studies: No results found.  Scheduled Meds: . amLODipine  10 mg Oral Daily  . amoxicillin-clavulanate  1 tablet Oral Q12H  . calcium acetate  667 mg Oral BID WC  . DULoxetine  40 mg Oral Daily  . feeding supplement  1 Container Oral TID BM  . heparin  5,000 Units Subcutaneous Q8H  . insulin aspart  0-9 Units Subcutaneous TID WC  . mouth rinse  15 mL Mouth Rinse BID  . multivitamin with minerals  1 tablet Oral Daily  . nicotine  14 mg Transdermal Daily  . potassium chloride  40 mEq Oral Daily  . vitamin B-12  2,000 mcg Oral Daily    Continuous Infusions: . piperacillin-tazobactam (ZOSYN)  IV Stopped (10/15/17 0946)  . thiamine injection 0 mg (10/14/17 2156)     LOS: 5 days     Nita Sells, MD [facility]   If 7PM-7AM, please contact night-coverage [email or web address] Password [email or web address] 10/15/2017, 9:50 AM

## 2017-10-15 NOTE — Progress Notes (Signed)
PHARMACY NOTE:  ANTIMICROBIAL RENAL DOSAGE ADJUSTMENT  Current antimicrobial regimen includes a mismatch between antimicrobial dosage and estimated renal function.  As per policy approved by the Pharmacy & Therapeutics and Medical Executive Committees, the antimicrobial dosage will be adjusted accordingly.  Current antimicrobial dosage:  Augmentin 875/125mg  BID  Indication: CAP  Renal Function:  Estimated Creatinine Clearance: 29.4 mL/min (A) (by C-G formula based on SCr of 1.2 mg/dL (H)). []      On intermittent HD, scheduled: []      On CRRT    Antimicrobial dosage has been changed to:  Augmentin 500/125mg  PO BID  Additional comments: looks like patient's renal function is back to baseline. If CrCl is <75mL/min, the 875mg  Augmentin product is not recommended.   Thank you for allowing pharmacy to be a part of this patient's care.  Verna Czech, [facility] 10/15/2017 10:12 AM

## 2017-10-15 NOTE — Progress Notes (Signed)
Subjective: Brighter affect and more talkative today.   Objective: Current vital signs: BP (!) 172/76   Pulse 88   Temp 98 F (36.7 C) (Oral)   Resp 20   Ht 5' 4"  (1.626 m)   Wt 43.9 kg (96 lb 12.5 oz)   SpO2 93%   BMI 16.61 kg/m  Vital signs in last 24 hours: Temp:  [97.9 F (36.6 C)-98.5 F (36.9 C)] 98 F (36.7 C) (12/18 0741) Pulse Rate:  [79-99] 88 (12/18 0903) Resp:  [20-22] 20 (12/18 0415) BP: (151-197)/(70-93) 172/76 (12/18 0903) SpO2:  [92 %-100 %] 93 % (12/18 0903) Weight:  [43.9 kg (96 lb 12.5 oz)] 43.9 kg (96 lb 12.5 oz) (12/18 0415)  Intake/Output from previous day: 12/17 0701 - 12/18 0700 In: 1085 [P.O.:935; IV Piggyback:150] Out: 750 [Urine:750] Intake/Output this shift: Total I/O In: 290 [P.O.:240; IV Piggyback:50] Out: 450 [Urine:450] Nutritional status: Diet Heart Room service appropriate? Yes; Fluid consistency: Thin  Mental Status: Brighter affect and more talkative today. Improved eye contact. Follows all commands and answers simple questions. Oriented to 4/5 questions (not oriented to day of week). No dysarthria or dysphasia noted.  Cranial Nerves: Fixates well. Tracks examiner normally. No nystagmus. Smile symmetric. Hearing intact to voice. No hypophonia or hoarseness. Head midline.  Motor: Unchanged. Decreased muscle bulk x 4 without asymmetry.  Mild upper extremity tremor noted intermittently.    Lab Results: Results for orders placed or performed during the hospital encounter of 10/09/17 (from the past 48 hour(s))  Glucose, capillary     Status: Abnormal   Collection Time: 10/13/17 12:14 PM  Result Value Ref Range   Glucose-Capillary 120 (H) 65 - 99 mg/dL  Glucose, capillary     Status: Abnormal   Collection Time: 10/13/17  4:29 PM  Result Value Ref Range   Glucose-Capillary 120 (H) 65 - 99 mg/dL  Glucose, capillary     Status: Abnormal   Collection Time: 10/13/17  9:24 PM  Result Value Ref Range   Glucose-Capillary 150 (H) 65 - 99  mg/dL  CBC with Differential/Platelet     Status: Abnormal   Collection Time: 10/14/17  4:04 AM  Result Value Ref Range   WBC 7.5 4.0 - 10.5 K/uL   RBC 4.15 3.87 - 5.11 MIL/uL   Hemoglobin 12.6 12.0 - 15.0 g/dL   HCT 38.3 36.0 - 46.0 %   MCV 92.3 78.0 - 100.0 fL   MCH 30.4 26.0 - 34.0 pg   MCHC 32.9 30.0 - 36.0 g/dL   RDW 15.8 (H) 11.5 - 15.5 %   Platelets 187 150 - 400 K/uL   Neutrophils Relative % 92 %   Neutro Abs 6.9 1.7 - 7.7 K/uL   Lymphocytes Relative 3 %   Lymphs Abs 0.2 (L) 0.7 - 4.0 K/uL   Monocytes Relative 5 %   Monocytes Absolute 0.4 0.1 - 1.0 K/uL   Eosinophils Relative 0 %   Eosinophils Absolute 0.0 0.0 - 0.7 K/uL   Basophils Relative 0 %   Basophils Absolute 0.0 0.0 - 0.1 K/uL  Basic metabolic panel     Status: Abnormal   Collection Time: 10/14/17  4:04 AM  Result Value Ref Range   Sodium 142 135 - 145 mmol/L   Potassium 3.8 3.5 - 5.1 mmol/L   Chloride 108 101 - 111 mmol/L   CO2 21 (L) 22 - 32 mmol/L   Glucose, Bld 118 (H) 65 - 99 mg/dL   BUN 24 (H) 6 - 20 mg/dL  Creatinine, Ser 1.22 (H) 0.44 - 1.00 mg/dL   Calcium 8.7 (L) 8.9 - 10.3 mg/dL   GFR calc non Af Amer 43 (L) >60 mL/min   GFR calc Af Amer 50 (L) >60 mL/min    Comment: (NOTE) The eGFR has been calculated using the CKD EPI equation. This calculation has not been validated in all clinical situations. eGFR's persistently <60 mL/min signify possible Chronic Kidney Disease.    Anion gap 13 5 - 15  Glucose, capillary     Status: Abnormal   Collection Time: 10/14/17  7:54 AM  Result Value Ref Range   Glucose-Capillary 104 (H) 65 - 99 mg/dL  Glucose, capillary     Status: Abnormal   Collection Time: 10/14/17 11:36 AM  Result Value Ref Range   Glucose-Capillary 148 (H) 65 - 99 mg/dL  Glucose, capillary     Status: Abnormal   Collection Time: 10/14/17  4:56 PM  Result Value Ref Range   Glucose-Capillary 111 (H) 65 - 99 mg/dL  Vitamin B12     Status: None   Collection Time: 10/14/17  6:31 PM   Result Value Ref Range   Vitamin B-12 213 180 - 914 pg/mL    Comment: (NOTE) This assay is not validated for testing neonatal or myeloproliferative syndrome specimens for Vitamin B12 levels.   Glucose, capillary     Status: Abnormal   Collection Time: 10/14/17  8:42 PM  Result Value Ref Range   Glucose-Capillary 130 (H) 65 - 99 mg/dL  CBC with Differential/Platelet     Status: Abnormal   Collection Time: 10/15/17  5:56 AM  Result Value Ref Range   WBC 5.2 4.0 - 10.5 K/uL   RBC 4.08 3.87 - 5.11 MIL/uL   Hemoglobin 12.3 12.0 - 15.0 g/dL   HCT 37.9 36.0 - 46.0 %   MCV 92.9 78.0 - 100.0 fL   MCH 30.1 26.0 - 34.0 pg   MCHC 32.5 30.0 - 36.0 g/dL   RDW 15.6 (H) 11.5 - 15.5 %   Platelets 177 150 - 400 K/uL   Neutrophils Relative % 86 %   Neutro Abs 4.4 1.7 - 7.7 K/uL   Lymphocytes Relative 10 %   Lymphs Abs 0.5 (L) 0.7 - 4.0 K/uL   Monocytes Relative 4 %   Monocytes Absolute 0.2 0.1 - 1.0 K/uL   Eosinophils Relative 0 %   Eosinophils Absolute 0.0 0.0 - 0.7 K/uL   Basophils Relative 0 %   Basophils Absolute 0.0 0.0 - 0.1 K/uL  Basic metabolic panel     Status: Abnormal   Collection Time: 10/15/17  5:56 AM  Result Value Ref Range   Sodium 139 135 - 145 mmol/L   Potassium 3.2 (L) 3.5 - 5.1 mmol/L   Chloride 107 101 - 111 mmol/L   CO2 20 (L) 22 - 32 mmol/L   Glucose, Bld 107 (H) 65 - 99 mg/dL   BUN 26 (H) 6 - 20 mg/dL   Creatinine, Ser 1.20 (H) 0.44 - 1.00 mg/dL   Calcium 8.2 (L) 8.9 - 10.3 mg/dL   GFR calc non Af Amer 44 (L) >60 mL/min   GFR calc Af Amer 51 (L) >60 mL/min    Comment: (NOTE) The eGFR has been calculated using the CKD EPI equation. This calculation has not been validated in all clinical situations. eGFR's persistently <60 mL/min signify possible Chronic Kidney Disease.    Anion gap 12 5 - 15  Glucose, capillary     Status: None  Collection Time: 10/15/17  7:43 AM  Result Value Ref Range   Glucose-Capillary 94 65 - 99 mg/dL    Recent Results (from the  past 240 hour(s))  Culture, blood (x 2)     Status: None (Preliminary result)   Collection Time: 10/10/17  2:48 AM  Result Value Ref Range Status   Specimen Description BLOOD RIGHT ANTECUBITAL  Final   Special Requests   Final    BOTTLES DRAWN AEROBIC AND ANAEROBIC Blood Culture adequate volume   Culture NO GROWTH 4 DAYS  Final   Report Status PENDING  Incomplete  Culture, blood (x 2)     Status: None (Preliminary result)   Collection Time: 10/10/17  2:56 AM  Result Value Ref Range Status   Specimen Description BLOOD RIGHT ARM  Final   Special Requests   Final    BOTTLES DRAWN AEROBIC AND ANAEROBIC Blood Culture adequate volume   Culture NO GROWTH 4 DAYS  Final   Report Status PENDING  Incomplete  MRSA PCR Screening     Status: None   Collection Time: 10/10/17  5:27 PM  Result Value Ref Range Status   MRSA by PCR NEGATIVE NEGATIVE Final    Comment:        The GeneXpert MRSA Assay (FDA approved for NASAL specimens only), is one component of a comprehensive MRSA colonization surveillance program. It is not intended to diagnose MRSA infection nor to guide or monitor treatment for MRSA infections.     Lipid Panel No results for input(s): CHOL, TRIG, HDL, CHOLHDL, VLDL, LDLCALC in the last 72 hours.  Studies/Results: No results found.  Medications:  Scheduled: . amLODipine  10 mg Oral Daily  . amoxicillin-clavulanate  1 tablet Oral Q12H  . calcium acetate  667 mg Oral BID WC  . DULoxetine  40 mg Oral Daily  . feeding supplement  1 Container Oral TID BM  . heparin  5,000 Units Subcutaneous Q8H  . insulin aspart  0-9 Units Subcutaneous TID WC  . mouth rinse  15 mL Mouth Rinse BID  . multivitamin with minerals  1 tablet Oral Daily  . nicotine  14 mg Transdermal Daily  . potassium chloride  40 mEq Oral Daily  . vitamin B-12  2,000 mcg Oral Daily   Continuous: . thiamine injection Stopped (10/15/17 0951)   JSE:[id number] **OR** acetaminophen, hydrALAZINE,  levalbuterol, ondansetron **OR** ondansetron (ZOFRAN) IV, oxyCODONE-acetaminophen, traMADol  EEG 12/17: This is an abnormal EEG demonstrating a moderate diffuse slowing of electrocerebral activity. This can be seen in a wide variety of encephalopathic state including those of a toxic, metabolic, or degenerative nature.  As above, there were a significant number of triphasic sharp wave forms throughout the recording, which would indicate a metabolic etiology.  Correlate clinically.  There were no focal, hemispheric, or lateralizing features.  No epileptiform activity was recorded.  Assessment: 71 year old female with cognitive impairment, possible psychogenic or psychiatric overlay 1. Improved cognition and ocular motility today. May be due to high-dose thiamine treatment in the setting of possible Wernicke encephalopathy. Continue to monitor for improvement.  2. MRI brain showed no acute abnormality. Diffuse cerebral atrophy and mild/moderate chronic small vessel ischemic changes were noted.  3. Other DDx for AMS includes psychogenic, mild dementia (cerebral atrophy on MRI) and B12 deficiency.  4. AST/ALT and ammonia unremarkable.  5. TSH on admit was low at 0.105 in the context of her history of Graves disease. Given history of autoimmune thyroid disease, the DDx for her  impaired cognition also includes Hashimoto's encephalopathy.  6. Low B12 level. Starting supplementation with high-dose oral B12. MMA and homocysteine have been ordered.  7. EEG showed triphasic sharp wave forms throughout the recording, which would indicate a metabolic etiology. No epileptiform activity was recorded.  Recommendations: 1.Continue thiamine supplementation IV 500 mg TID x 3 days, then switch to 100 mg po qd.  2. Anti-thyroglobulin and anti-thyroid peroxidase antibodies.     LOS: 5 days   @Electronically  signed: Dr. Kerney Elbe 10/15/2017  12:06 PM

## 2017-10-15 NOTE — Evaluation (Signed)
Clinical/Bedside Swallow Evaluation Patient Details  Name: [name] MRN: [id number] Date of Birth: May 21, 1946  Today's Date: 10/15/2017 Time: SLP Start Time (ACUTE ONLY): 1109 SLP Stop Time (ACUTE ONLY): 1115 SLP Time Calculation (min) (ACUTE ONLY): 6 min  Past Medical History:  Past Medical History:  Diagnosis Date  . Angina   . Anxiety and depression   . Arthritis   . Asthma   . Blood dyscrasia    hx of thrombphlebitis  . CAD (coronary artery disease)   . Carotid artery occlusion   . Chronic kidney disease   . Chronic pain    leg and feet  . COPD (chronic obstructive pulmonary disease) ([place])   . DDD (degenerative disc disease)   . DDD (degenerative disc disease)   . Depression   . Diabetes mellitus   . Diarrhea    chronic   . Dry mouth   . DVT (deep venous thrombosis) ([place])   . Fibromyalgia   . GERD (gastroesophageal reflux disease)   . Grave's disease   . Headache(784.0)   . HTN (hypertension)   . OP (osteoporosis)   . Parkinson's disease ([place])   . Peripheral vascular disease ([place])   . PUD (peptic ulcer disease)   . Recurrent upper respiratory infection (URI)   . Rhinitis   . Shortness of breath   . Sleep apnea   . Spinal stenosis   . Spinal stenosis of lumbar region 04/23/2013  . Thrombophlebitis   . Thyroid disorder   . Tobacco use disorder 04/23/2013  . Ulcer   . Vitamin B 12 deficiency    Past Surgical History:  Past Surgical History:  Procedure Laterality Date  . CAROTID ENDARTERECTOMY Left Sept. 20,2011   cea  . CHOLECYSTECTOMY  1999  . GASTRECTOMY     age 88   Part of small intestin and part of stomach  . LEFT HEART CATHETERIZATION WITH CORONARY ANGIOGRAM N/A 10/02/2011   Procedure: LEFT HEART CATHETERIZATION WITH CORONARY ANGIOGRAM;  Surgeon: Sueanne Margarita, MD;  Location: [facility];  Service: Cardiovascular;  Laterality: N/A;   HPI:  71 yo female reporting global headaches and non-radiating substernal chest pain, reports she has taken  10+ baby aspirin a day to try to relieve pain. Diagnosed with salicylate toxicity. PMH COPD, CAD, HTN, DM, CKD, spinal stenosis, recurrent URI, asthmaGraves disease, hx of intentional overdose, hx of DVT, anxiety, chronic pain, Parkinsons, DDD, spinal stenosis. CXR development of bilateral parenchymal lung disease, left side greater than right. Differential diagnosis includes asymmetric pulmonary edema versus bilateral pneumonia. Markedly increased densities at the left lung base could represent consolidation and/or pleural fluid.   Assessment / Plan / Recommendation Clinical Impression  Pt denied prior oropharyngeal swallow difficulties; reposrts takes reflux meds. Educated pt to remain upright min 30 min after meals. Consumption across textures appeared normal, no indications of airway compromise. Chest reviews over past several years indicative of COPD (which does increase risk of silent aspiration) however no pna's. Pt should continue regular texture, thin liquids. No further ST needed.    SLP Visit Diagnosis: Dysphagia, unspecified (R13.10)    Aspiration Risk  Mild aspiration risk    Diet Recommendation Regular;Thin liquid   Liquid Administration via: Straw;Cup Medication Administration: Whole meds with liquid Supervision: Patient able to self feed Compensations: Slow rate;Small sips/bites Postural Changes: Remain upright for at least 30 minutes after po intake;Seated upright at 90 degrees    Other  Recommendations Oral Care Recommendations: Oral care BID   Follow  up Recommendations None      Frequency and Duration            Prognosis        Swallow Study   General HPI: 71 yo female reporting global headaches and non-radiating substernal chest pain, reports she has taken 10+ baby aspirin a day to try to relieve pain. Diagnosed with salicylate toxicity. PMH COPD, CAD, HTN, DM, CKD, spinal stenosis, recurrent URI, asthmaGraves disease, hx of intentional overdose, hx of DVT,  anxiety, chronic pain, Parkinsons, DDD, spinal stenosis. CXR development of bilateral parenchymal lung disease, left side greater than right. Differential diagnosis includes asymmetric pulmonary edema versus bilateral pneumonia. Markedly increased densities at the left lung base could represent consolidation and/or pleural fluid. Type of Study: Bedside Swallow Evaluation Previous Swallow Assessment: (none) Diet Prior to this Study: Regular;Thin liquids Temperature Spikes Noted: No Respiratory Status: Room air History of Recent Intubation: No Behavior/Cognition: Alert;Cooperative Oral Cavity Assessment: Within Functional Limits Oral Care Completed by SLP: No Oral Cavity - Dentition: (missing posterior lower, missing upper, no dentures) Vision: Functional for self-feeding Self-Feeding Abilities: Able to feed self Patient Positioning: Upright in bed Baseline Vocal Quality: Normal Volitional Cough: Strong Volitional Swallow: Able to elicit    Oral/Motor/Sensory Function Overall Oral Motor/Sensory Function: Within functional limits   Ice Chips Ice chips: Not tested   Thin Liquid Thin Liquid: Within functional limits Presentation: Cup;Straw    Nectar Thick Nectar Thick Liquid: Not tested   Honey Thick Honey Thick Liquid: Not tested   Puree Puree: Within functional limits   Solid   GO   Solid: Within functional limits        Houston Siren 10/15/2017,11:22 AM  Orbie Pyo Colvin Caroli.Ed [employer] [phone]

## 2017-10-16 DIAGNOSIS — Z7982 Long term (current) use of aspirin: Secondary | ICD-10-CM | POA: Diagnosis not present

## 2017-10-16 DIAGNOSIS — R079 Chest pain, unspecified: Secondary | ICD-10-CM | POA: Diagnosis not present

## 2017-10-16 DIAGNOSIS — E876 Hypokalemia: Secondary | ICD-10-CM | POA: Diagnosis not present

## 2017-10-16 DIAGNOSIS — R488 Other symbolic dysfunctions: Secondary | ICD-10-CM | POA: Diagnosis not present

## 2017-10-16 DIAGNOSIS — R0789 Other chest pain: Secondary | ICD-10-CM | POA: Diagnosis not present

## 2017-10-16 DIAGNOSIS — G20 Parkinson's disease: Secondary | ICD-10-CM | POA: Diagnosis not present

## 2017-10-16 DIAGNOSIS — K219 Gastro-esophageal reflux disease without esophagitis: Secondary | ICD-10-CM | POA: Diagnosis not present

## 2017-10-16 DIAGNOSIS — R0602 Shortness of breath: Secondary | ICD-10-CM | POA: Diagnosis not present

## 2017-10-16 DIAGNOSIS — J189 Pneumonia, unspecified organism: Secondary | ICD-10-CM | POA: Diagnosis not present

## 2017-10-16 DIAGNOSIS — M797 Fibromyalgia: Secondary | ICD-10-CM | POA: Diagnosis not present

## 2017-10-16 DIAGNOSIS — R51 Headache: Secondary | ICD-10-CM | POA: Diagnosis not present

## 2017-10-16 DIAGNOSIS — G9341 Metabolic encephalopathy: Secondary | ICD-10-CM | POA: Diagnosis not present

## 2017-10-16 DIAGNOSIS — J449 Chronic obstructive pulmonary disease, unspecified: Secondary | ICD-10-CM | POA: Diagnosis not present

## 2017-10-16 DIAGNOSIS — M1990 Unspecified osteoarthritis, unspecified site: Secondary | ICD-10-CM | POA: Diagnosis not present

## 2017-10-16 DIAGNOSIS — Z9189 Other specified personal risk factors, not elsewhere classified: Secondary | ICD-10-CM | POA: Diagnosis not present

## 2017-10-16 DIAGNOSIS — E1122 Type 2 diabetes mellitus with diabetic chronic kidney disease: Secondary | ICD-10-CM | POA: Diagnosis not present

## 2017-10-16 DIAGNOSIS — M810 Age-related osteoporosis without current pathological fracture: Secondary | ICD-10-CM | POA: Diagnosis not present

## 2017-10-16 DIAGNOSIS — Z79899 Other long term (current) drug therapy: Secondary | ICD-10-CM | POA: Diagnosis not present

## 2017-10-16 DIAGNOSIS — E0500 Thyrotoxicosis with diffuse goiter without thyrotoxic crisis or storm: Secondary | ICD-10-CM | POA: Diagnosis not present

## 2017-10-16 DIAGNOSIS — M6281 Muscle weakness (generalized): Secondary | ICD-10-CM | POA: Diagnosis not present

## 2017-10-16 DIAGNOSIS — R000 Tachycardia, unspecified: Secondary | ICD-10-CM | POA: Diagnosis not present

## 2017-10-16 DIAGNOSIS — I700 Atherosclerosis of aorta: Secondary | ICD-10-CM | POA: Diagnosis not present

## 2017-10-16 DIAGNOSIS — G8929 Other chronic pain: Secondary | ICD-10-CM | POA: Diagnosis not present

## 2017-10-16 DIAGNOSIS — R9431 Abnormal electrocardiogram [ECG] [EKG]: Secondary | ICD-10-CM | POA: Diagnosis not present

## 2017-10-16 DIAGNOSIS — E43 Unspecified severe protein-calorie malnutrition: Secondary | ICD-10-CM | POA: Diagnosis not present

## 2017-10-16 DIAGNOSIS — T39094D Poisoning by salicylates, undetermined, subsequent encounter: Secondary | ICD-10-CM | POA: Diagnosis not present

## 2017-10-16 DIAGNOSIS — R0781 Pleurodynia: Secondary | ICD-10-CM | POA: Diagnosis not present

## 2017-10-16 DIAGNOSIS — Z87891 Personal history of nicotine dependence: Secondary | ICD-10-CM | POA: Diagnosis not present

## 2017-10-16 DIAGNOSIS — F17210 Nicotine dependence, cigarettes, uncomplicated: Secondary | ICD-10-CM | POA: Diagnosis not present

## 2017-10-16 DIAGNOSIS — R262 Difficulty in walking, not elsewhere classified: Secondary | ICD-10-CM | POA: Diagnosis not present

## 2017-10-16 DIAGNOSIS — I129 Hypertensive chronic kidney disease with stage 1 through stage 4 chronic kidney disease, or unspecified chronic kidney disease: Secondary | ICD-10-CM | POA: Diagnosis not present

## 2017-10-16 DIAGNOSIS — Z86718 Personal history of other venous thrombosis and embolism: Secondary | ICD-10-CM | POA: Diagnosis not present

## 2017-10-16 DIAGNOSIS — N189 Chronic kidney disease, unspecified: Secondary | ICD-10-CM | POA: Diagnosis not present

## 2017-10-16 DIAGNOSIS — J441 Chronic obstructive pulmonary disease with (acute) exacerbation: Secondary | ICD-10-CM | POA: Diagnosis not present

## 2017-10-16 DIAGNOSIS — T39094A Poisoning by salicylates, undetermined, initial encounter: Secondary | ICD-10-CM | POA: Diagnosis not present

## 2017-10-16 DIAGNOSIS — I2510 Atherosclerotic heart disease of native coronary artery without angina pectoris: Secondary | ICD-10-CM | POA: Diagnosis not present

## 2017-10-16 DIAGNOSIS — R195 Other fecal abnormalities: Secondary | ICD-10-CM | POA: Diagnosis not present

## 2017-10-16 LAB — CBC WITH DIFFERENTIAL/PLATELET
Basophils Absolute: 0 10*3/uL (ref 0.0–0.1)
Basophils Relative: 0 %
Eosinophils Absolute: 0 10*3/uL (ref 0.0–0.7)
Eosinophils Relative: 0 %
HCT: 37.8 % (ref 36.0–46.0)
Hemoglobin: 12.6 g/dL (ref 12.0–15.0)
Lymphocytes Relative: 6 %
Lymphs Abs: 0.7 10*3/uL (ref 0.7–4.0)
MCH: 31 pg (ref 26.0–34.0)
MCHC: 33.3 g/dL (ref 30.0–36.0)
MCV: 92.9 fL (ref 78.0–100.0)
Monocytes Absolute: 0.7 10*3/uL (ref 0.1–1.0)
Monocytes Relative: 7 %
Neutro Abs: 8.8 10*3/uL — ABNORMAL HIGH (ref 1.7–7.7)
Neutrophils Relative %: 87 %
Platelets: 209 10*3/uL (ref 150–400)
RBC: 4.07 MIL/uL (ref 3.87–5.11)
RDW: 15.5 % (ref 11.5–15.5)
WBC: 10.2 10*3/uL (ref 4.0–10.5)

## 2017-10-16 LAB — HOMOCYSTEINE: Homocysteine: 14.4 umol/L (ref 0.0–15.0)

## 2017-10-16 LAB — THYROID PEROXIDASE ANTIBODY: Thyroperoxidase Ab SerPl-aCnc: 11 IU/mL (ref 0–34)

## 2017-10-16 LAB — GLUCOSE, CAPILLARY
Glucose-Capillary: 88 mg/dL (ref 65–99)
Glucose-Capillary: 90 mg/dL (ref 65–99)

## 2017-10-16 LAB — PROCALCITONIN: Procalcitonin: 0.2 ng/mL

## 2017-10-16 LAB — THYROGLOBULIN ANTIBODY: Thyroglobulin Antibody: <1 IU/mL (ref 0.0–0.9)

## 2017-10-16 MED ORDER — GUAIFENESIN-DM 100-10 MG/5ML PO SYRP
5.0000 mL | ORAL_SOLUTION | ORAL | 0 refills | Status: DC | PRN
Start: 2017-10-16 — End: 2018-05-17

## 2017-10-16 MED ORDER — METOPROLOL SUCCINATE ER 25 MG PO TB24: 12.5000 mg | ORAL_TABLET | Freq: Every day | ORAL | 0 refills | Status: DC

## 2017-10-16 MED ORDER — OXYCODONE-ACETAMINOPHEN 5-325 MG PO TABS: 1.0000 | ORAL_TABLET | Freq: Three times a day (TID) | ORAL | 0 refills | Status: DC | PRN

## 2017-10-16 MED ORDER — POTASSIUM CHLORIDE CRYS ER 20 MEQ PO TBCR: 40.0000 meq | EXTENDED_RELEASE_TABLET | Freq: Every day | ORAL | 0 refills | Status: DC

## 2017-10-16 MED ORDER — TRAMADOL HCL 50 MG PO TABS: 50.0000 mg | ORAL_TABLET | Freq: Four times a day (QID) | ORAL | 0 refills | Status: DC | PRN

## 2017-10-16 MED ORDER — LEVALBUTEROL HCL 0.63 MG/3ML IN NEBU: 0.6300 mg | INHALATION_SOLUTION | RESPIRATORY_TRACT | 12 refills | Status: DC | PRN

## 2017-10-16 MED ORDER — DULOXETINE HCL 40 MG PO CPEP: 40.0000 mg | ORAL_CAPSULE | Freq: Every day | ORAL | 0 refills | Status: DC

## 2017-10-16 NOTE — Progress Notes (Addendum)
CSW met with patient at bedside and discussed PT recommendation for SNF and gave SNF bed offers. Patient agreeable to ST SNF and chose [name]. CSW confirmed [name] can take patient today.  Patient will discharge to [name]. Anticipated discharge date: 10/16/17 Family notified: [name], sister Transportation by: [facility]  Nurse to call report to [phone]. Patient will go to room 115 at the facility.   CSW signing off.  [name], [place]  Clinical Social Worker

## 2017-10-16 NOTE — Discharge Summary (Signed)
Physician Discharge Summary  COTY STUDENT FIE:[id number] DOB: 02/05/1946 DOA: 10/09/2017  PCP: Thressa Sheller, MD  Admit date: 10/09/2017 Discharge date: 10/16/2017  Time spent: 35 minutes  Recommendations for Outpatient Follow-up:  1. Completed full 7 day course of Abx and steroids 12/19 2. Needs CXR 1 month for routine follow up 3. Encourage smoking cessation as OP 4. Note new meds Robitussin,levalbuterol, Metoprolol this admit--also refilled tramadol, oxycodone as well as started back Wellbutrin this am 5. Will d/c to SNF when available 6. Needs bmet and cbc 1 week    Discharge Diagnoses:  Principal Problem:   Salicylate intoxication, undetermined intent, initial encounter Active Problems:   Chest pain   COPD (chronic obstructive pulmonary disease) ([place])   SIRS (systemic inflammatory response syndrome) ([facility])   Acute renal failure superimposed on chronic kidney disease ([place])   Headache   Discharge Condition: improved  Diet recommendation: hh low salt  Filed Weights   10/14/17 0416 10/15/17 0415 10/16/17 0350  Weight: 45.2 kg (99 lb 10.4 oz) 43.9 kg (96 lb 12.5 oz) 40.9 kg (90 lb 2.7 oz)    History of present illness:  71 y.o.female   COPD, CAD, HTN, DM type 2, CKD, Graves Dz,history of intentional overdose, h/ODVT, and tobacco abuse; Admit 12/13 HA,SOB,SSCP Ran out of pain meds-in effort to relieve pain-Took 10 Aspirin-developed some tinnitus/hear loss denied self-harm admittwed for mild unintenttinal salicyclate tox and PNA  #Acute Salicylate toxicity: Resolving   took 10 or more 81 mg aspirins for 2 days.  denied SI or attempt. Has a history of suicide attempt previously On admit mixed respiratory alkalosis with metabolic acidosis on ABG D/C Sodium bicarb drip as per poison control during [place] stay UDS negative no rationale for OD or Suicidality at bedside 12/18--hold psych consult   #Sepsis Resolving, likely due to CAP with underlying COPD Afebrile,  tachycardic, resolved leukocytosis LA WNL, Procalcitonin elevated at 2.13--not repeated so repeating now 12/18 and was 0.22 BC X 2, NGTD CXR: Markedly increased densities at the left lung base could represent consolidation and/or pleural fluid Continue Zosyn, d/c vanc--de-escalate to Augementin  And ultimately d/c on d/c 16/60   #Acute metabolic encephalopathy Still ongoing, despite IV AB Likely due to sepsis Vs toxic CT head showed chronic small vessel disease without acute intracranial abnormality MRI w/o contrast: with motion degraded examination without evidence of acute abnormality. Mild-to-moderate chronic small vessel ischemic disease and cerebral atrophy Management for sepsis as above Neurology consulted--EEG ordered--wide DDX Toxic encephalopathy, degen changes but seemed resolve don d/c and appreciate Nuro input   #Acute renal failure on chronic kidney disease:  Resolved Patient baseline creatinine previously has been on 1-1.3 Creat now down 27/1.8 on admit to 26/1.2 Needs OP cbc and bmet   #COPD, with acute exacerbation due to ??CAP IV solumedrol, duonebs scheduled/prn--d/c steorids 12/18 as no wheeze See above discussion re: Abx   #Chest pain Resolved EKG showed no significant ischemic changes, ED trop neg  Sinus tach HR ~ 100 and BP elevated-probably somewhat anxious Added Metoprolol XL 12.5 daily to amlodipine on d/c Can titrate as needed   #Headache -Tylenol prn -CT head and MRI brain as above, no acute changes   #Chronic pain  - Restarted home pain medication, percocet prn and tramadol   #Subclinical hyperthyroidism: TSH 0.105, Free T4 0.90 on 10/09/17  Review of records shows a history of Graves' disease, but patient currently not on any medications for treatment. - Continue outpatient monitoring   Severe Protein energy malnutrition  Frail and cachectic Needs OP goals and nutrition input  Mild hypokalemia Repeat labs as OP Will need Kdur 40 until  then and adjust as necessarty     Discharge Exam: Vitals:   10/16/17 0350 10/16/17 0728  BP: (!) 157/91 (!) 172/83  Pulse: 90 74  Resp: 15   Temp: 97.7 F (36.5 C) 98.4 F (36.9 C)  SpO2: 96% 100%    General: alert dischevelled in nad, cachectic Cardiovascular:  s1 s 2no m/r/g Respiratory: clear no added sound No le edema Neuro intact and moving 4 limbs equally  Discharge Instructions    Allergies as of 10/16/2017   No Known Allergies     Medication List    STOP taking these medications   predniSONE 5 MG tablet Commonly known as:  DELTASONE     TAKE these medications   acetaminophen 500 MG tablet Commonly known as:  TYLENOL Take 500 mg by mouth every 6 (six) hours as needed for moderate pain or headache.   albuterol-ipratropium 18-103 MCG/ACT inhaler Commonly known as:  COMBIVENT Inhale 2 puffs into the lungs every 6 (six) hours as needed for shortness of breath (sob). For shortness of breath   amLODipine 10 MG tablet Commonly known as:  NORVASC Take 1 tablet (10 mg total) by mouth daily.   aspirin 81 MG tablet Take 81 mg by mouth daily.   calcium acetate 667 MG capsule Commonly known as:  PHOSLO Take 667 mg by mouth 2 (two) times daily.   clonazePAM 1 MG tablet Commonly known as:  KLONOPIN Take 1 mg by mouth 2 (two) times daily as needed for anxiety (anxiety). For anxiety   DULoxetine HCl 40 MG Cpep Take 40 mg by mouth daily.   guaiFENesin-dextromethorphan 100-10 MG/5ML syrup Commonly known as:  ROBITUSSIN DM Take 5 mLs by mouth every 4 (four) hours as needed for cough.   levalbuterol 0.63 MG/3ML nebulizer solution Commonly known as:  XOPENEX Take 3 mLs (0.63 mg total) by nebulization every 2 (two) hours as needed for wheezing or shortness of breath.   metoprolol succinate 25 MG 24 hr tablet Commonly known as:  TOPROL XL Take 0.5 tablets (12.5 mg total) by mouth daily.   nicotine 14 mg/24hr patch Commonly known as:  NICODERM CQ - dosed in  mg/24 hours Place 1 patch (14 mg total) onto the skin daily.   nitroGLYCERIN 0.4 MG SL tablet Commonly known as:  NITROSTAT Place 0.4 mg under the tongue every 5 (five) minutes as needed. For chest pain   ondansetron 8 MG tablet Commonly known as:  ZOFRAN Take 0.5 tablets (4 mg total) by mouth every 8 (eight) hours as needed for nausea or vomiting.   oxyCODONE-acetaminophen 5-325 MG tablet Commonly known as:  PERCOCET/ROXICET Take 1 tablet by mouth every 8 (eight) hours as needed for moderate pain. What changed:    when to take this  reasons to take this   pantoprazole 40 MG tablet Commonly known as:  PROTONIX Take 40 mg by mouth 2 (two) times daily. Filled 08-23-17   potassium chloride SA 20 MEQ tablet Commonly known as:  K-DUR,KLOR-CON Take 2 tablets (40 mEq total) by mouth daily. What changed:    how much to take  when to take this   PRENATAL PO Take 1 tablet by mouth daily.   traMADol 50 MG tablet Commonly known as:  ULTRAM Take 1 tablet (50 mg total) by mouth every 6 (six) hours as needed for moderate pain.  No Known Allergies    The results of significant diagnostics from this hospitalization (including imaging, microbiology, ancillary and laboratory) are listed below for reference.    Significant Diagnostic Studies: Dg Chest 2 View  Result Date: 10/09/2017 CLINICAL DATA:  Chest pain EXAM: CHEST  2 VIEW COMPARISON:  07/29/2015 FINDINGS: There is hyperinflation of the lungs compatible with COPD. Scarring in the bases. Heart is normal size. No effusions or acute bony abnormality. IMPRESSION: COPD.  Bibasilar scarring.  No active disease. Electronically Signed   By: Rolm Baptise M.D.   On: 10/09/2017 12:00   Ct Head Wo Contrast  Result Date: 10/09/2017 CLINICAL DATA:  Headache for 3 days EXAM: CT HEAD WITHOUT CONTRAST TECHNIQUE: Contiguous axial images were obtained from the base of the skull through the vertex without intravenous contrast. COMPARISON:   brain MRI 07/30/2015 FINDINGS: Brain: No mass lesion, intraparenchymal hemorrhage or extra-axial collection. No evidence of acute cortical infarct. There is periventricular hypoattenuation compatible with chronic microvascular disease. Vascular: No hyperdense vessel or unexpected calcification. Skull: Normal visualized skull base, calvarium and extracranial soft tissues. Sinuses/Orbits: No sinus fluid levels or advanced mucosal thickening. No mastoid effusion. Normal orbits. IMPRESSION: Chronic small vessel disease without acute intracranial abnormality. Electronically Signed   By: Ulyses Jarred M.D.   On: 10/09/2017 18:28   Mr Brain Wo Contrast  Result Date: 10/12/2017 CLINICAL DATA:  Headaches.  Salicylate toxicity. EXAM: MRI HEAD WITHOUT CONTRAST TECHNIQUE: Multiplanar, multiecho pulse sequences of the brain and surrounding structures were obtained without intravenous contrast. COMPARISON:  Head CT 10/09/2017 and MRI 07/30/2015 FINDINGS: Multiple sequences are moderately motion degraded despite repeated imaging attempts. Brain: There is no evidence of acute infarct, intracranial hemorrhage, mass, midline shift, or extra-axial fluid collection. There is mild-to-moderate central predominant cerebral atrophy. Mild periventricular white matter and moderate pontine T2 hyperintensity is nonspecific but compatible with chronic small vessel ischemic disease. There is mild T2 heterogeneity in the thalami. Vascular: Major intracranial vascular flow voids are preserved. Skull and upper cervical spine: Unremarkable bone marrow signal. Sinuses/Orbits: Bilateral cataract extraction. Small volume fluid in the right maxillary sinus. Clear mastoid air cells. Other: None. IMPRESSION: 1. Motion degraded examination without evidence of acute abnormality. 2. Mild-to-moderate chronic small vessel ischemic disease and cerebral atrophy. Electronically Signed   By: Logan Bores M.D.   On: 10/12/2017 14:05   Dg Chest Port 1  View  Result Date: 10/11/2017 CLINICAL DATA:  Hypoxia.  Severe back pain. EXAM: PORTABLE CHEST 1 VIEW COMPARISON:  10/09/2017 FINDINGS: Development of extensive parenchymal densities in both lungs but particularly in the left lung. There is new opacification or consolidation at the left lung base. Cannot exclude left pleural effusion. New densities at the right lung base. Negative for a pneumothorax. The trachea is midline. IMPRESSION: Development of bilateral parenchymal lung disease, left side greater than right. Differential diagnosis includes asymmetric pulmonary edema versus bilateral pneumonia. Markedly increased densities at the left lung base could represent consolidation and/or pleural fluid. Electronically Signed   By: Markus Daft M.D.   On: 10/11/2017 09:04    Microbiology: Recent Results (from the past 240 hour(s))  Culture, blood (x 2)     Status: None   Collection Time: 10/10/17  2:48 AM  Result Value Ref Range Status   Specimen Description BLOOD RIGHT ANTECUBITAL  Final   Special Requests   Final    BOTTLES DRAWN AEROBIC AND ANAEROBIC Blood Culture adequate volume   Culture NO GROWTH 5 DAYS  Final   Report  Status 10/15/2017 FINAL  Final  Culture, blood (x 2)     Status: None   Collection Time: 10/10/17  2:56 AM  Result Value Ref Range Status   Specimen Description BLOOD RIGHT ARM  Final   Special Requests   Final    BOTTLES DRAWN AEROBIC AND ANAEROBIC Blood Culture adequate volume   Culture NO GROWTH 5 DAYS  Final   Report Status 10/15/2017 FINAL  Final  MRSA PCR Screening     Status: None   Collection Time: 10/10/17  5:27 PM  Result Value Ref Range Status   MRSA by PCR NEGATIVE NEGATIVE Final    Comment:        The GeneXpert MRSA Assay (FDA approved for NASAL specimens only), is one component of a comprehensive MRSA colonization surveillance program. It is not intended to diagnose MRSA infection nor to guide or monitor treatment for MRSA infections.       Labs: Basic Metabolic Panel: Recent Labs  Lab 10/11/17 0433 10/12/17 0510 10/13/17 0819 10/14/17 0404 10/15/17 0556  NA 141 138 140 142 139  K 3.8 3.5 3.8 3.8 3.2*  CL 105 105 107 108 107  CO2 26 20* 20* 21* 20*  GLUCOSE 101* 167* 110* 118* 107*  BUN 27* 20 20 24* 26*  CREATININE 1.88* 1.46* 1.18* 1.22* 1.20*  CALCIUM 7.2* 8.5* 8.8* 8.7* 8.2*   Liver Function Tests: Recent Labs  Lab 10/10/17 0049  AST 27  ALT 10*  ALKPHOS 103  BILITOT 0.7  PROT 5.5*  ALBUMIN 2.9*   No results for input(s): LIPASE, AMYLASE in the last 168 hours. Recent Labs  Lab 10/09/17 2217  AMMONIA 33   CBC: Recent Labs  Lab 10/12/17 0510 10/13/17 0819 10/14/17 0404 10/15/17 0556 10/16/17 0349  WBC 13.1* 10.9* 7.5 5.2 10.2  NEUTROABS 12.6* 10.2* 6.9 4.4 8.8*  HGB 12.4 12.5 12.6 12.3 12.6  HCT 38.2 38.2 38.3 37.9 37.8  MCV 93.4 92.7 92.3 92.9 92.9  PLT 179 167 187 177 209   Cardiac Enzymes: Recent Labs  Lab 10/10/17 0238  TROPONINI 0.03*   BNP: BNP (last 3 results) No results for input(s): BNP in the last 8760 hours.  ProBNP (last 3 results) No results for input(s): PROBNP in the last 8760 hours.  CBG: Recent Labs  Lab 10/15/17 0743 10/15/17 1211 10/15/17 1734 10/15/17 2042 10/16/17 0731  GLUCAP 94 122* 139* 97 88       Signed:  Nita Sells MD   [facility] 10/16/2017, 8:33 AM

## 2017-10-16 NOTE — Clinical Social Work Placement (Signed)
   CLINICAL SOCIAL WORK PLACEMENT  NOTE  Date:  10/16/2017  Patient Details  Name: [name] MRN: [id number] Date of Birth: Apr 19, 1946  Clinical Social Work is seeking post-discharge placement for this patient at the [place] level of care (*CSW will initial, date and re-position this form in  chart as items are completed):  Yes   Patient/family provided with [place] [facility] Work Department's list of facilities offering this level of care within the geographic area requested by the patient (or if unable, by the patient's family).  Yes   Patient/family informed of their freedom to choose among providers that offer the needed level of care, that participate in Medicare, Medicaid or managed care program needed by the patient, have an available bed and are willing to accept the patient.  Yes   Patient/family informed of [facility]'s ownership interest in [facility] and [facility], as well as of the fact that they are under no obligation to receive care at these facilities.  PASRR submitted to EDS on       PASRR number received on       Existing PASRR number confirmed on 10/15/17     FL2 transmitted to all facilities in geographic area requested by pt/family on 10/15/17     FL2 transmitted to all facilities within larger geographic area on       Patient informed that his/her managed care company has contracts with or will negotiate with certain facilities, including the following:  [facility]     Yes   Patient/family informed of bed offers received.  Patient chooses bed at [facility] recommends and patient chooses bed at      Patient to be transferred to [facility] on 10/16/17.  Patient to be transferred to facility by [facility]     Patient family notified on 10/16/17 of transfer.  Name of family member notified:  [name], sister     PHYSICIAN Please prepare priority  discharge summary, including medications, Please prepare prescriptions     Additional Comment:    _______________________________________________ [name], LCSW 10/16/2017, 10:34 AM

## 2017-10-16 NOTE — Consult Note (Signed)
            [facility] CM Primary Care Navigator  10/16/2017  [name] [birth year] [id number]    Went to see patient at the bedside to identify possible discharge needs. Patientreportshaving "bad headaches" that hadledto this admission.  Patient endorses [facility].Walter Pharr with [facility] asherprimary care provider.   [facility] [address] obtain medications without anyproblem.  Patient reportsmanagingher ownmedicationsat homestraight out of the containers.  Patientverbalized thatshe uses Medicaid transport services to provide transportation to herdoctors'appointments.  Patient lives with a friend [name]) at home. According to patient, her friend will be providing assistance with her care needs after returning back home.  Anticipated discharge plan is skilled nursing facility (SNF)for short term rehabilitationper therapy recommendation.  Patientvoiced understanding to callprimary care provider's officewhen shegets backhometo schedulea post discharge follow-upwithin1-2 weeksor sooner if needed. Patient letter (with PCP's contact number) was provided as areminder.  Explained to [facility] CM services available for health management at home.She reports being able to manage her health issues so far without difficulty.  Patientvoiced understandingto seek referral from primary care provider to [facility] care management asdeemed necessary and appropriatefor services in the future- whenshereturns back home.  [facility] care management information provided for future needs that may arise.   For additional questions please contact:  Edwena Felty A. Shilpa Bushee, BSN, RN-BC [facility] PRIMARY CARE Navigator Cell: [phone]

## 2017-10-16 NOTE — Progress Notes (Signed)
Called report to [facility] home. [facility] transporting patient to facility.
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
## 2017-10-17 ENCOUNTER — Emergency Department (HOSPITAL_COMMUNITY): Payer: Medicare Other

## 2017-10-17 ENCOUNTER — Non-Acute Institutional Stay (SKILLED_NURSING_FACILITY): Payer: Medicare Other | Admitting: Internal Medicine

## 2017-10-17 ENCOUNTER — Encounter: Payer: Self-pay | Admitting: Internal Medicine

## 2017-10-17 ENCOUNTER — Emergency Department (HOSPITAL_COMMUNITY)
Admission: EM | Admit: 2017-10-17 | Discharge: 2017-10-17 | Disposition: A | Discharge status: Home / Self Care | Payer: Medicare Other | Attending: Emergency Medicine | Admitting: Emergency Medicine

## 2017-10-17 DIAGNOSIS — R195 Other fecal abnormalities: Secondary | ICD-10-CM

## 2017-10-17 DIAGNOSIS — G20 Parkinson's disease: Secondary | ICD-10-CM | POA: Diagnosis not present

## 2017-10-17 DIAGNOSIS — R0781 Pleurodynia: Secondary | ICD-10-CM

## 2017-10-17 DIAGNOSIS — I700 Atherosclerosis of aorta: Secondary | ICD-10-CM | POA: Diagnosis not present

## 2017-10-17 DIAGNOSIS — R079 Chest pain, unspecified: Secondary | ICD-10-CM | POA: Diagnosis not present

## 2017-10-17 DIAGNOSIS — J449 Chronic obstructive pulmonary disease, unspecified: Secondary | ICD-10-CM | POA: Diagnosis not present

## 2017-10-17 DIAGNOSIS — I2510 Atherosclerotic heart disease of native coronary artery without angina pectoris: Secondary | ICD-10-CM | POA: Insufficient documentation

## 2017-10-17 DIAGNOSIS — N189 Chronic kidney disease, unspecified: Secondary | ICD-10-CM | POA: Diagnosis not present

## 2017-10-17 DIAGNOSIS — T39094D Poisoning by salicylates, undetermined, subsequent encounter: Secondary | ICD-10-CM

## 2017-10-17 DIAGNOSIS — E1122 Type 2 diabetes mellitus with diabetic chronic kidney disease: Secondary | ICD-10-CM | POA: Diagnosis not present

## 2017-10-17 DIAGNOSIS — R9431 Abnormal electrocardiogram [ECG] [EKG]: Secondary | ICD-10-CM | POA: Diagnosis not present

## 2017-10-17 DIAGNOSIS — I129 Hypertensive chronic kidney disease with stage 1 through stage 4 chronic kidney disease, or unspecified chronic kidney disease: Secondary | ICD-10-CM | POA: Insufficient documentation

## 2017-10-17 DIAGNOSIS — J189 Pneumonia, unspecified organism: Secondary | ICD-10-CM | POA: Diagnosis not present

## 2017-10-17 DIAGNOSIS — F32A Depression, unspecified: Secondary | ICD-10-CM

## 2017-10-17 DIAGNOSIS — R0602 Shortness of breath: Secondary | ICD-10-CM | POA: Diagnosis not present

## 2017-10-17 DIAGNOSIS — F17210 Nicotine dependence, cigarettes, uncomplicated: Secondary | ICD-10-CM | POA: Insufficient documentation

## 2017-10-17 DIAGNOSIS — Z7982 Long term (current) use of aspirin: Secondary | ICD-10-CM | POA: Diagnosis not present

## 2017-10-17 DIAGNOSIS — Z9189 Other specified personal risk factors, not elsewhere classified: Secondary | ICD-10-CM | POA: Diagnosis not present

## 2017-10-17 DIAGNOSIS — Z79899 Other long term (current) drug therapy: Secondary | ICD-10-CM | POA: Insufficient documentation

## 2017-10-17 DIAGNOSIS — F329 Major depressive disorder, single episode, unspecified: Secondary | ICD-10-CM

## 2017-10-17 LAB — BASIC METABOLIC PANEL
Anion gap: 7 (ref 5–15)
BUN: 21 mg/dL — ABNORMAL HIGH (ref 6–20)
CO2: 21 mmol/L — ABNORMAL LOW (ref 22–32)
Calcium: 8.2 mg/dL — ABNORMAL LOW (ref 8.9–10.3)
Chloride: 112 mmol/L — ABNORMAL HIGH (ref 101–111)
Creatinine, Ser: 1.09 mg/dL — ABNORMAL HIGH (ref 0.44–1.00)
GFR calc Af Amer: 57 mL/min — ABNORMAL LOW (ref >60)
GFR calc non Af Amer: 49 mL/min — ABNORMAL LOW (ref >60)
Glucose, Bld: 88 mg/dL (ref 65–99)
Potassium: 4.1 mmol/L (ref 3.5–5.1)
Sodium: 140 mmol/L (ref 135–145)

## 2017-10-17 LAB — BRAIN NATRIURETIC PEPTIDE: B Natriuretic Peptide: 96.2 pg/mL (ref 0.0–100.0)

## 2017-10-17 LAB — CBC WITH DIFFERENTIAL/PLATELET
Basophils Absolute: 0 10*3/uL (ref 0.0–0.1)
Basophils Relative: 0 %
Eosinophils Absolute: 0.1 10*3/uL (ref 0.0–0.7)
Eosinophils Relative: 1 %
HCT: 36.2 % (ref 36.0–46.0)
Hemoglobin: 11.8 g/dL — ABNORMAL LOW (ref 12.0–15.0)
Lymphocytes Relative: 6 %
Lymphs Abs: 0.6 10*3/uL — ABNORMAL LOW (ref 0.7–4.0)
MCH: 30.3 pg (ref 26.0–34.0)
MCHC: 32.6 g/dL (ref 30.0–36.0)
MCV: 93.1 fL (ref 78.0–100.0)
Monocytes Absolute: 0.3 10*3/uL (ref 0.1–1.0)
Monocytes Relative: 3 %
Neutro Abs: 8.4 10*3/uL — ABNORMAL HIGH (ref 1.7–7.7)
Neutrophils Relative %: 90 %
Platelets: 214 10*3/uL (ref 150–400)
RBC: 3.89 MIL/uL (ref 3.87–5.11)
RDW: 15.4 % (ref 11.5–15.5)
WBC: 9.4 10*3/uL (ref 4.0–10.5)

## 2017-10-17 LAB — I-STAT TROPONIN, ED: Troponin i, poc: 0.01 ng/mL (ref 0.00–0.08)

## 2017-10-17 MED ORDER — IOPAMIDOL (ISOVUE-370) INJECTION 76%
INTRAVENOUS | Status: AC
  Administered 2017-10-17: 100 mL via INTRAVENOUS
  Filled 2017-10-17: qty 100

## 2017-10-17 MED ORDER — AMOXICILLIN-POT CLAVULANATE 875-125 MG PO TABS: 1.0000 | ORAL_TABLET | Freq: Two times a day (BID) | ORAL | 0 refills | Status: DC

## 2017-10-17 MED ORDER — IOPAMIDOL (ISOVUE-370) INJECTION 76%
80.0000 mL | Freq: Once | INTRAVENOUS | Status: AC | PRN
  Administered 2017-10-17: 100 mL via INTRAVENOUS

## 2017-10-17 MED ORDER — MORPHINE SULFATE (PF) 2 MG/ML IV SOLN
2.0000 mg | Freq: Once | INTRAVENOUS | Status: AC
  Administered 2017-10-17: 2 mg via INTRAVENOUS
  Filled 2017-10-17: qty 1

## 2017-10-17 NOTE — Assessment & Plan Note (Signed)
Transfer to ED for D-dimer, chest x-ray, and CT angiogram

## 2017-10-17 NOTE — ED Triage Notes (Signed)
Per EMS-patient was at [facility] with left sided chest pain-states pain with inhalation-sent to ED for further eval-r/o PE

## 2017-10-17 NOTE — Patient Instructions (Signed)
See assessment and plan under each diagnosis in the problem list and acutely for this visit Total time 52  minutes; greater than 50% of the visit spent counseling patient and coordinating care for problems addressed at this encounter  

## 2017-10-17 NOTE — Discharge Instructions (Signed)
You have continued pneumonia in the left lower lung.  Discuss this with your docs at your rehab facility.  Return for worsening symptoms.

## 2017-10-17 NOTE — ED Provider Notes (Signed)
[facility] DEPT Provider Note   CSN: [id number] Arrival date & time: 10/17/17  1410     History   Chief Complaint Chief Complaint  Patient presents with  . Chest Pain    HPI [name] is a 71 y.o. female.  70 yo F with a chief complaint of chest pain.  This is located to the left posterior axillary line is sharp worse with a deep breath.  She has had some shortness of breath with this.  Has been going on for the past day.  At her facility they were concerned for possible pulmonary embolism and transferred her here.  She has had no history of PE but had a DVT in the remote past.  She has had some cough and congestion going on for the past week as well.  Denies fevers or chills.  Denies lower extremity edema.  Denies estrogen use.  Denies recent surgery.  She is a smoker.  She has quit since been in the facility.  Sister had an MI in her early 68s.  She personally has no history of heart attack.  History of hypertension.  Denies hyperlipidemia or diabetes.   The history is provided by the patient.  Chest Pain   This is a new problem. The current episode started yesterday. The problem occurs constantly. The problem has been rapidly worsening. The pain is associated with coughing and breathing. The pain is present in the lateral region. The pain is at a severity of 8/10. The pain is severe. The quality of the pain is described as pleuritic. The pain does not radiate. Duration of episode(s) is 12 hours. Associated symptoms include cough and shortness of breath. Pertinent negatives include no dizziness, no fever, no headaches, no nausea, no palpitations and no vomiting. She has tried nothing for the symptoms. The treatment provided no relief.  Her past medical history is significant for DVT and hypertension.  Pertinent negatives for past medical history include no diabetes, no hyperlipidemia, no MI and no PE.  Her family medical history is significant for  early MI.    Past Medical History:  Diagnosis Date  . Anxiety and depression   . Arthritis   . Asthma   . CAD (coronary artery disease)   . Carotid artery occlusion   . Chronic kidney disease   . Chronic pain    leg and feet  . COPD (chronic obstructive pulmonary disease) ([place])   . DDD (degenerative disc disease)   . Depression   . Diabetes mellitus   . Diarrhea    chronic   . DVT (deep venous thrombosis) ([place])   . Fibromyalgia   . GERD (gastroesophageal reflux disease)   . Grave's disease   . Headache(784.0)   . HTN (hypertension)   . OP (osteoporosis)   . Parkinson's disease ([place])   . Peripheral vascular disease ([place])   . PUD (peptic ulcer disease)   . Recurrent upper respiratory infection (URI)   . Rhinitis   . Sleep apnea   . Spinal stenosis of lumbar region 04/23/2013  . Thrombophlebitis   . Tobacco use disorder 04/23/2013  . Vitamin B 12 deficiency     Patient Active Problem List   Diagnosis Date Noted  . At risk for adverse drug event 10/17/2017  . Salicylate intoxication, undetermined intent, subsequent encounter 10/10/2017  . Acute renal failure superimposed on chronic kidney disease ([place]) 10/10/2017  . Headache 10/10/2017  . Protein-calorie malnutrition, severe 01/26/2016  . Opiate overdose (  [place]) 01/25/2016  . Suicide attempt ([place]) 01/25/2016  . Depression   . Dyslipidemia   . Gastroesophageal reflux disease without esophagitis   . COPD (chronic obstructive pulmonary disease) ([place]) 07/29/2015  . Diabetes mellitus with neurological manifestation ([place]) 07/29/2015  . Frequent falls 07/29/2015  . HTN (hypertension) 07/29/2015  . AKI (acute kidney injury) ([place]) 07/29/2015  . SIRS (systemic inflammatory response syndrome) ([place]) 07/29/2015  . Spinal stenosis of lumbar region 04/23/2013  . Tremor due to multiple drugs 04/23/2013  . Tobacco use disorder 04/23/2013  . COPD exacerbation ([place]) 04/23/2013  . Occlusion and stenosis of carotid artery without  mention of cerebral infarction 03/12/2012  . Viral bronchitis-possible h. infl vs Norovirus 11/21/2011  . Pleuritic chest pain 09/18/2011    Past Surgical History:  Procedure Laterality Date  . CAROTID ENDARTERECTOMY Left Sept. 20,2011   cea  . CHOLECYSTECTOMY  1999  . GASTRECTOMY     age 83   Part of small intestin and part of stomach  . LEFT HEART CATHETERIZATION WITH CORONARY ANGIOGRAM N/A 10/02/2011   Procedure: LEFT HEART CATHETERIZATION WITH CORONARY ANGIOGRAM;  Surgeon: Sueanne Margarita, MD;  Location: [facility] CATH LAB;  Service: Cardiovascular;  Laterality: N/A;    OB History    No data available       Home Medications    Prior to Admission medications   Medication Sig Start Date End Date Taking? Authorizing Provider  amLODipine (NORVASC) 10 MG tablet Take 1 tablet (10 mg total) by mouth daily. 01/28/16  Yes Thurnell Lose, MD  DULoxetine HCl 40 MG CPEP Take 40 mg by mouth daily. 10/16/17  Yes Nita Sells, MD  guaiFENesin-dextromethorphan (ROBITUSSIN DM) 100-10 MG/5ML syrup Take 5 mLs by mouth every 4 (four) hours as needed for cough. 10/16/17  Yes Nita Sells, MD  metoprolol succinate (TOPROL XL) 25 MG 24 hr tablet Take 0.5 tablets (12.5 mg total) by mouth daily. 10/16/17 10/16/18 Yes Nita Sells, MD  nicotine (NICODERM CQ - DOSED IN MG/24 HOURS) 14 mg/24hr patch Place 1 patch (14 mg total) onto the skin daily. 02/02/16  Yes Elgergawy, Silver Huguenin, MD  ondansetron (ZOFRAN) 8 MG tablet Take 0.5 tablets (4 mg total) by mouth every 8 (eight) hours as needed for nausea or vomiting. 02/02/16  Yes Elgergawy, Silver Huguenin, MD  acetaminophen (TYLENOL) 500 MG tablet Take 500 mg by mouth every 6 (six) hours as needed for moderate pain or headache.    [provider]  albuterol (ACCUNEB) 0.63 MG/3ML nebulizer solution Take 1 ampule by nebulization every 4 (four) hours as needed for wheezing or shortness of breath.    [provider]  albuterol-ipratropium  (COMBIVENT) 18-103 MCG/ACT inhaler Inhale 2 puffs into the lungs every 6 (six) hours as needed for shortness of breath (sob). For shortness of breath    [provider]  amoxicillin-clavulanate (AUGMENTIN) 875-125 MG tablet Take 1 tablet by mouth 2 (two) times daily. One po bid x 7 days 10/17/17   Deno Etienne, DO  aspirin 81 MG tablet Take 81 mg by mouth daily.      [provider]  calcium acetate (PHOSLO) 667 MG capsule Take 667 mg by mouth 2 (two) times daily.     [provider]  clonazePAM (KLONOPIN) 1 MG tablet Take 1 mg by mouth 2 (two) times daily as needed for anxiety (anxiety). For anxiety 10/16/17 09/29/18  [provider]  nitroGLYCERIN (NITROSTAT) 0.4 MG SL tablet Place 0.4 mg under the tongue every 5 (five)  minutes as needed. For chest pain     [provider]  oxyCODONE-acetaminophen (PERCOCET/ROXICET) 5-325 MG tablet Take 1 tablet by mouth every 8 (eight) hours as needed for moderate pain. 10/16/17   Nita Sells, MD  pantoprazole (PROTONIX) 40 MG tablet Take 40 mg by mouth 2 (two) times daily. Filled 08-23-17    [provider]  potassium chloride SA (K-DUR,KLOR-CON) 20 MEQ tablet Take 2 tablets (40 mEq total) by mouth daily. 10/16/17   Nita Sells, MD  Prenatal Vit-Fe Fumarate-FA (PRENATAL PO) Take 1 tablet by mouth daily.    [provider]  traMADol (ULTRAM) 50 MG tablet Take 1 tablet (50 mg total) by mouth every 6 (six) hours as needed for moderate pain. 10/16/17   Nita Sells, MD    Family History Family History  Problem Relation Age of Onset  . Diabetes Mother   . Hyperlipidemia Mother   . Heart attack Mother   . Other Mother        varicose veins,respiratory,stroke  . Heart disease Mother        before age 69  . Hypertension Mother   . Varicose Veins Mother   . Diabetes Father   . Heart disease Father        before age 52  . Hyperlipidemia Father   . Heart attack Father   .  Other Father        varicose veins  . Hypertension Father   . Varicose Veins Father   . Diabetes Sister   . Heart disease Sister        before age 49  . Hyperlipidemia Sister   . Heart attack Sister   . Other Sister        varicose veins  . Hypertension Sister   . Varicose Veins Sister   . Peripheral vascular disease Sister   . Diabetes Sister   . Hyperlipidemia Sister   . Hypertension Sister   . Varicose Veins Sister     Social History Social History   Tobacco Use  . Smoking status: Current Every Day Smoker    Packs/day: 2.00    Years: 50.00    Pack years: 100.00    Types: Cigarettes  . Smokeless tobacco: Never Used  . Tobacco comment: cessation info given and reviewed  Substance Use Topics  . Alcohol use: No    Alcohol/week: 0.0 oz  . Drug use: No     Allergies   Patient has no known allergies.   Review of Systems Review of Systems  Constitutional: Negative for chills and fever.  HENT: Positive for congestion. Negative for rhinorrhea.   Eyes: Negative for redness and visual disturbance.  Respiratory: Positive for cough and shortness of breath. Negative for wheezing.   Cardiovascular: Positive for chest pain. Negative for palpitations.  Gastrointestinal: Negative for nausea and vomiting.  Genitourinary: Negative for dysuria and urgency.  Musculoskeletal: Negative for arthralgias and myalgias.  Skin: Negative for pallor and wound.  Neurological: Negative for dizziness and headaches.     Physical Exam Updated Vital Signs BP (!) 166/72   Pulse 69   Temp 98.6 F (37 C) (Oral)   Resp 18   SpO2 96%   Physical Exam  Constitutional: She is oriented to person, place, and time. She appears well-developed and well-nourished. No distress.  HENT:  Head: Normocephalic and atraumatic.  Eyes: EOM are normal. Pupils are equal, round, and reactive to light.  Neck: Normal range of motion. Neck supple.  Cardiovascular: Normal rate  and regular rhythm. Exam reveals  no gallop and no friction rub.  No murmur heard. Pulmonary/Chest: Effort normal. She has no wheezes. She has no rales.  Abdominal: Soft. She exhibits no distension. There is no tenderness.  Musculoskeletal: She exhibits no edema or tenderness.  Neurological: She is alert and oriented to person, place, and time.  Skin: Skin is warm and dry. She is not diaphoretic.  Psychiatric: She has a normal mood and affect. Her behavior is normal.  Nursing note and vitals reviewed.    ED Treatments / Results  Labs (all labs ordered are listed, but only abnormal results are displayed) Labs Reviewed  CBC WITH DIFFERENTIAL/PLATELET - Abnormal; Notable for the following components:      Result Value   Hemoglobin 11.8 (*)    Neutro Abs 8.4 (*)    Lymphs Abs 0.6 (*)    All other components within normal limits  BASIC METABOLIC PANEL - Abnormal; Notable for the following components:   Chloride 112 (*)    CO2 21 (*)    BUN 21 (*)    Creatinine, Ser 1.09 (*)    Calcium 8.2 (*)    GFR calc non Af Amer 49 (*)    GFR calc Af Amer 57 (*)    All other components within normal limits  BRAIN NATRIURETIC PEPTIDE  I-STAT TROPONIN, ED    EKG  EKG Interpretation  Date/Time:  Thursday October 17 2017 14:59:16 EST Ventricular Rate:  75 PR Interval:    QRS Duration: 85 QT Interval:  402 QTC Calculation: 449 R Axis:   -28 Text Interpretation:  Sinus rhythm Probable left atrial enlargement LVH with secondary repolarization abnormality No significant change since last tracing Confirmed by Deno Etienne [phone]) on 10/17/2017 3:02:14 PM       Radiology Dg Chest 2 View  Result Date: 10/17/2017 CLINICAL DATA:  Sided chest pain and shortness of breath began today. History of asthma -COPD. Current smoker. EXAM: CHEST  2 VIEW COMPARISON:  Portable chest x-ray of October 11, 2017 FINDINGS: The lungs are hyperinflated. Left basilar density persists and there is a small left pleural effusion. The confluent  interstitial infiltrate seen previously markedly improved. The lung markings remain coarse however. The heart and pulmonary vascularity are normal. The trachea is midline. The bony thorax exhibits no acute abnormality. IMPRESSION: There is a small left pleural effusion. Persistent left basilar atelectasis or pneumonia. Mild interstitial prominence is consistent with residual interstitial pneumonia or edema. Electronically Signed   By: David  Martinique M.D.   On: 10/17/2017 17:10   Ct Angio Chest Pe W And/or Wo Contrast  Result Date: 10/17/2017 CLINICAL DATA:  71 year old female with acute chest pain. EXAM: CT ANGIOGRAPHY CHEST WITH CONTRAST TECHNIQUE: Multidetector CT imaging of the chest was performed using the standard protocol during bolus administration of intravenous contrast. Multiplanar CT image reconstructions and MIPs were obtained to evaluate the vascular anatomy. CONTRAST:  80 cc intravenous Isovue 370 COMPARISON:  10/25/2017 chest radiograph, 05/31/2006 chest CT and other studies FINDINGS: Cardiovascular: This is a technically satisfactory study. No pulmonary emboli are identified. Upper limits normal heart size noted. Thoracic aortic atherosclerotic calcifications noted without aneurysm. No pericardial effusion. Mediastinum/Nodes: No enlarged mediastinal, hilar, or axillary lymph nodes. Thyroid gland, trachea, and esophagus demonstrate no significant findings. Lungs/Pleura: Scattered ground-glass and mild airspace opacities within the lungs bilaterally noted, greatest in the left mid and lower lung. A small to moderate left pleural effusion noted with moderate left lower lobe atelectasis. There is  no evidence of mass or pneumothorax. Interlobular septal thickening is noted peripherally, left-greater-than-right. Upper Abdomen: No acute abnormality. Musculoskeletal: No acute bony abnormalities or suspicious bony lesions. Review of the MIP images confirms the above findings. IMPRESSION: 1. Patchy  ground-glass and airspace opacities within the lungs, greatest in the left mid-lower lung. These opacities are nonspecific but favor infection over inflammatory causes. 2. Small to moderate left pleural effusion and moderate left lower lobe atelectasis. 3. No evidence of pulmonary emboli or thoracic aortic aneurysm. 4. Peripheral interlobular septal thickening, left-greater-than-right, which may represent interstitial lung disease/UIP. 5.  Aortic Atherosclerosis (ICD10-I70.0). Electronically Signed   By: Margarette Canada M.D.   On: 10/17/2017 20:31    Procedures Procedures (including critical care time)  Medications Ordered in ED Medications  morphine 2 MG/ML injection 2 mg (2 mg Intravenous Given 10/17/17 1606)  iopamidol (ISOVUE-370) 76 % injection 80 mL (100 mLs Intravenous Contrast Given 10/17/17 2001)     Initial Impression / Assessment and Plan / ED Course  I have reviewed the triage vital signs and the nursing notes.  Pertinent labs & imaging results that were available during my care of the patient were reviewed by me and considered in my medical decision making (see chart for details).     71 yo F with a chief complaint of left-sided pleuritic chest pain.  Going on since yesterday.  Patient's cough and congestion will obtain a chest x-ray to evaluate for left lower pneumonia.  Patient does have possible pneumonia but not a very large focus.  CT scan is negative for PE but consistent with left lower lobe pneumonia.  This with the patient was recently diagnosed with.  She states that she has been off antibiotics for period of time.  I discussed coming back into the hospital as this is most likely hospital-acquired pneumonia.  The patient is reluctant to return and feels that she be able to get higher level of care at [facility].  I will started on Augmentin.  8:44 PM:  I have discussed the diagnosis/risks/treatment options with the patient and family and believe the pt to be eligible for  discharge home to follow-up with PCP. We also discussed returning to the ED immediately if new or worsening sx occur. We discussed the sx which are most concerning (e.g., sudden worsening pain, fever, inability to tolerate by mouth) that necessitate immediate return. Medications administered to the patient during their visit and any new prescriptions provided to the patient are listed below.  Medications given during this visit Medications  morphine 2 MG/ML injection 2 mg (2 mg Intravenous Given 10/17/17 1606)  iopamidol (ISOVUE-370) 76 % injection 80 mL (100 mLs Intravenous Contrast Given 10/17/17 2001)     The patient appears reasonably screen and/or stabilized for discharge and I doubt any other medical condition or other [facility] requiring further screening, evaluation, or treatment in the ED at this time prior to discharge.    Final Clinical Impressions(s) / ED Diagnoses   Final diagnoses:  HCAP (healthcare-associated pneumonia)    ED Discharge Orders        Ordered    amoxicillin-clavulanate (AUGMENTIN) 875-125 MG tablet  2 times daily     10/17/17 2038       Deno Etienne, DO 10/17/17 2044

## 2017-10-17 NOTE — Progress Notes (Signed)
NURSING HOME LOCATION:  [facility] ROOM NUMBER:  [id number]  CODE STATUS:  Full Code  PCP:  Thressa Sheller, [address], [place] [address]   This is a comprehensive admission note to [facility] performed on this date less than 30 days from date of admission. Included are preadmission medical/surgical history;reconciled medication list; family history; social history and comprehensive review of systems.  Corrections and additions to the records were documented . Comprehensive physical exam was also performed. Additionally a clinical summary was entered for each active diagnosis pertinent to this admission in the Problem List to enhance continuity of care.  HPI: The patient was hospitalized 10/27-25/36/64 with salicylate intoxication of undetermined intent. Patient states that she had run out of her pain medicines and in an effort to relieve pain took 10 tablets of 81 mg aspirin over 48 hours ; this was associated with  tinnitus and hearing loss. She denied suicide gesture or suicide attempt. Urine drug screen was negative. She is followed by Dr. Sandi Mariscal at the [facility]. Significantly the patient has a history of suicide attempt via overdose of opioids in 2017.  The patient presented with headache, dyspnea, substernal chest pain. EKG revealed no significant ischemic changes. Troponin was negative. She did exhibit a sinus tachycardia with slight hypertension. Metroprolol as added to a calcium channel blocker.  Arterial blood gases revealed mixed respiratory alkalosis with metabolic acidosis; initially the patient received sodium bicarbonate drip. Pro-calcitonin was elevated at 2.13 and she exhibited leukocytosis. Blood cultures were negative.  Chest x-ray 12/14 revealed markedly increased bilateral densities, > on left. At the L lung base ,consolidation and/or pleural fluid could not be ruled out.   The film was personally reviewed. She has  extensive pulmonary infiltrates , left greater than the right. Air bronchograms suggested on the left. On the right there is hilar fullness and suprahilar infiltrate. On the left hemidiaphragm is not visible. There is consolidation versus atelectasis in the right lower lobe. These changes were not present on PA and lateral film 12/12. She was initially treated with vancomycin and Zosyn but transitioned to Augmentin to be continued until 12/19.  She exhibited acute metabolic encephalopathy attributed to sepsis. CT revealed chronic small vessel disease without acute intracranial abnormality. MRI also revealed no definite acute process but motion artifact was an issue. Cerebral atrophy was present. Neurology consulted, EEG revealed toxic encephalopathy. She also exhibited acute renal failure on chronic kidney disease. Baseline creatinine was felt to be 1-1.13. Creatinine was 1.8 at admission. The patient has chronic pain syndrome as noted. Her  Percocet was reinitiated and tramadol added. She was not on tramadol home. She exhibited subclinical hyperthyroidism; TSH was 0.105, free T4 0.90 on 12/12. She has severe protein malnutrition with associated frailty and cachexia. She was discharged to the SNF for PT/OT and nutritional support. She received IV Solu-Medrol and pulmonary toilet for an acute exacerbation of COPD  Past medical and surgical history: Medical history is extensive and includes B12 deficiency, history of ulcer, tobacco abuse, thrombophlebitis of UE, lumbar spinal stenosis, sleep apnea, peripheral vascular disease, Parkinson's disease, osteoporosis, fibromyalgia, deep venous thrombosis ( she denies hx of DVT or PTE today), depression, CKD, & coronary artery disease. Surgery includes coronary angiogram in 2012, partial gastrectomy for peptic ulcer disease, cholecystectomy, and carotid endarterectomy.  Social history: The patient smoked up to 2 packs a day and has smoked for 50 years. She does  not drink.  Family history:  Extensive history reviewed  Review of systems: She complains of severe left chest pain with even shallow inspirations. She states this may have been present up to a week. She also has a loose but nonproductive cough. She denies any upper respiratory tract symptoms. Her other major complaint is loose to watery stool present for a month, preceding the antibiotic therapy in the hospital. Chronic low back pain related to stenosis.She is followed at [facility] by Dr. Sandi Mariscal, seen every month for oxycodone 10/325 Rx taken 4 times a day. She states she was diagnosed with Graves' disease by her PCP. She states she does not use CPAP and describes it as "a waste of time and a pain in the butt".  Constitutional: No fever  Eyes: No redness, discharge, pain, vision change ENT/mouth: No nasal congestion,  purulent discharge, earache,change in hearing ,sore throat  Cardiovascular: No palpitations,paroxysmal nocturnal dyspnea, claudication, edema  Respiratory: No hemoptysis, significant snoring,apnea   Gastrointestinal: No heartburn,dysphagia,abdominal pain, nausea / vomiting,rectal bleeding, melena Genitourinary: No dysuria,hematuria, pyuria,  incontinence, nocturia. She is unsure why the Foley catheter was not removed at discharge. Dermatologic: No rash, pruritus, change in appearance of skin Neurologic: No dizziness,syncope, seizures Endocrine: No change in hair/skin/ nails, excessive thirst, excessive hunger, excessive urination  Hematologic/lymphatic: No significant bruising, lymphadenopathy,abnormal bleeding Allergy/immunology: No itchy/ watery eyes, significant sneezing, urticaria, angioedema  Physical exam:  Pertinent or positive findings:She appears chronically ill and somewhat cachectic. Proptosis is present. She has a prominent stare on the left. Intermittently the left eye is deviated inferiorly. She is not wearing her upper plate. S4 is present. Breath  sounds are decreased overall but there is a coarse rub at the left inferior axillary line. She is splinting with respirations.Foley catheter is present.   Posterior tibial pulses are strong. Dorsalis pedis pulses are decreased. She has atrophy of her limbs. Slight clubbing of the nailbeds is present. She's weak to opposition. Homans sign is negative.   General appearance: no acute distress , increased work of breathing is present.   Lymphatic: No lymphadenopathy about the head, neck, axilla . Eyes: No conjunctival inflammation or lid edema is present. There is no scleral icterus. Ears:  External ear exam shows no significant lesions or deformities.   Nose:  External nasal examination shows no deformity or inflammation. Nasal mucosa are pink and moist without lesions ,exudates Oral exam: lips and gums are healthy appearing.There is no oropharyngeal erythema or exudate . Neck:  No thyromegaly, masses, tenderness noted.    Heart:  Normal rate and regular rhythm without  murmur, click, rub .  Abdomen:Bowel sounds are normal. Abdomen is soft and nontender with no organomegaly, hernias,masses. GU: deferred  Extremities:  No cyanosis,edema  Neurologic exam : Strength equal  in upper & lower extremities Balance,Rhomberg,finger to nose testing could not be completed due to clinical state Deep tendon reflexes are equal Skin: Warm & dry w/o tenting. No significant lesions or rash.  See clinical summary under each active problem in the Problem List with associated updated therapeutic plan

## 2017-10-17 NOTE — ED Notes (Signed)
Sig pad not working

## 2017-10-17 NOTE — Assessment & Plan Note (Signed)
Psychiatry was not consulted in the hospital, psychiatry follow-up here will depend on her clinical course

## 2017-10-17 NOTE — Assessment & Plan Note (Addendum)
Long-term pain meds should only be Rxed by Dr Nancy Fetter , [facility]

## 2017-10-17 NOTE — Assessment & Plan Note (Signed)
Psychiatry follow-up as per PCP

## 2017-10-17 NOTE — ED Notes (Signed)
[facility] called  

## 2017-10-18 LAB — VITAMIN B1: Vitamin B1 (Thiamine): 114.1 nmol/L (ref 66.5–200.0)

## 2017-10-18 LAB — METHYLMALONIC ACID, SERUM: Methylmalonic Acid, Quantitative: 737 nmol/L — ABNORMAL HIGH (ref 0–378)

## 2017-10-19 ENCOUNTER — Telehealth: Payer: Self-pay | Admitting: Neurology

## 2017-10-19 NOTE — Progress Notes (Signed)
MMA came back elevated, consistent with clinically significant B12 deficiency. Discussed with Dr. Verlon Au who has notified the patient's facility to start oral B12 supplementation at 2 mg qd indefinitely. She should have levels reassessed in one months' time.   Electronically signed: Dr. Kerney Elbe

## 2017-10-19 NOTE — Progress Notes (Signed)
Received call about Methyl malonic acid being low from Dr. Joyce Copa [facility] and spoke to patien nurse--Clarified dosing indefinitely of Vit b12 2000 qd --will need MMA in 1-2 mo.  Verneita Griffes, MD [facility] [phone]

## 2017-10-30 ENCOUNTER — Other Ambulatory Visit: Payer: Self-pay | Admitting: *Deleted

## 2017-10-30 NOTE — Patient Outreach (Signed)
[facility]) Care Management  10/30/2017  JALAINE RIGGENBACH 07-Jun-1946 [id number]   Met with Cameron Ali, SW at facility.  She reports that patient wants to go home asap and is leaving tomorrow.  Patient lives with a roommate.  SW will set up home care.   Met with patient at facility.  Patient reports she has Medicare and Medicaid through [facility]. She reports she has COPD, she is a smoker and also has a medication interaction induced parkinson's disease She has a Physiological scientist, she has transportation.  Patient reports her roommate cooks.  Patient reports no issues with medication costs or management.   RNCM reviewed [facility] Care management services.  Patient accepted a [facility] brochure and magnet.  Patient declines [facility] services at this time, states she does not feel she has any [facility] care management needs.   Plan to sign off at this time.  Royetta Crochet. Laymond Purser, RN, BSN, [place] [phone]) Business Cell  [phone]) Toll Free Office

## 2017-11-07 DIAGNOSIS — E7800 Pure hypercholesterolemia, unspecified: Secondary | ICD-10-CM | POA: Diagnosis not present

## 2017-11-07 DIAGNOSIS — R0989 Other specified symptoms and signs involving the circulatory and respiratory systems: Secondary | ICD-10-CM | POA: Diagnosis not present

## 2017-11-07 DIAGNOSIS — Z87891 Personal history of nicotine dependence: Secondary | ICD-10-CM | POA: Diagnosis not present

## 2017-11-07 DIAGNOSIS — M79604 Pain in right leg: Secondary | ICD-10-CM | POA: Diagnosis not present

## 2017-11-07 DIAGNOSIS — M79605 Pain in left leg: Secondary | ICD-10-CM | POA: Diagnosis not present

## 2017-11-07 DIAGNOSIS — G8929 Other chronic pain: Secondary | ICD-10-CM | POA: Diagnosis not present

## 2017-11-07 DIAGNOSIS — Z79899 Other long term (current) drug therapy: Secondary | ICD-10-CM | POA: Diagnosis not present

## 2017-11-07 DIAGNOSIS — I10 Essential (primary) hypertension: Secondary | ICD-10-CM | POA: Diagnosis not present

## 2017-11-07 DIAGNOSIS — M5416 Radiculopathy, lumbar region: Secondary | ICD-10-CM | POA: Diagnosis not present

## 2017-11-14 DIAGNOSIS — E1122 Type 2 diabetes mellitus with diabetic chronic kidney disease: Secondary | ICD-10-CM | POA: Diagnosis not present

## 2017-11-14 DIAGNOSIS — I5032 Chronic diastolic (congestive) heart failure: Secondary | ICD-10-CM | POA: Diagnosis not present

## 2017-11-14 DIAGNOSIS — J449 Chronic obstructive pulmonary disease, unspecified: Secondary | ICD-10-CM | POA: Diagnosis not present

## 2017-11-14 DIAGNOSIS — J181 Lobar pneumonia, unspecified organism: Secondary | ICD-10-CM | POA: Diagnosis not present

## 2017-11-15 DIAGNOSIS — M48061 Spinal stenosis, lumbar region without neurogenic claudication: Secondary | ICD-10-CM | POA: Diagnosis not present

## 2017-11-15 DIAGNOSIS — G20 Parkinson's disease: Secondary | ICD-10-CM | POA: Diagnosis not present

## 2017-11-15 DIAGNOSIS — E43 Unspecified severe protein-calorie malnutrition: Secondary | ICD-10-CM | POA: Diagnosis not present

## 2017-11-15 DIAGNOSIS — J449 Chronic obstructive pulmonary disease, unspecified: Secondary | ICD-10-CM | POA: Diagnosis not present

## 2017-11-15 DIAGNOSIS — I739 Peripheral vascular disease, unspecified: Secondary | ICD-10-CM | POA: Diagnosis not present

## 2017-11-15 DIAGNOSIS — I2510 Atherosclerotic heart disease of native coronary artery without angina pectoris: Secondary | ICD-10-CM | POA: Diagnosis not present

## 2017-11-15 DIAGNOSIS — I129 Hypertensive chronic kidney disease with stage 1 through stage 4 chronic kidney disease, or unspecified chronic kidney disease: Secondary | ICD-10-CM | POA: Diagnosis not present

## 2017-11-15 DIAGNOSIS — E538 Deficiency of other specified B group vitamins: Secondary | ICD-10-CM | POA: Diagnosis not present

## 2017-11-15 DIAGNOSIS — M810 Age-related osteoporosis without current pathological fracture: Secondary | ICD-10-CM | POA: Diagnosis not present

## 2017-11-15 DIAGNOSIS — N189 Chronic kidney disease, unspecified: Secondary | ICD-10-CM | POA: Diagnosis not present

## 2017-11-15 DIAGNOSIS — G4730 Sleep apnea, unspecified: Secondary | ICD-10-CM | POA: Diagnosis not present

## 2017-11-15 DIAGNOSIS — E0590 Thyrotoxicosis, unspecified without thyrotoxic crisis or storm: Secondary | ICD-10-CM | POA: Diagnosis not present

## 2017-11-20 DIAGNOSIS — E43 Unspecified severe protein-calorie malnutrition: Secondary | ICD-10-CM | POA: Diagnosis not present

## 2017-11-20 DIAGNOSIS — I129 Hypertensive chronic kidney disease with stage 1 through stage 4 chronic kidney disease, or unspecified chronic kidney disease: Secondary | ICD-10-CM | POA: Diagnosis not present

## 2017-11-20 DIAGNOSIS — E538 Deficiency of other specified B group vitamins: Secondary | ICD-10-CM | POA: Diagnosis not present

## 2017-11-20 DIAGNOSIS — G4730 Sleep apnea, unspecified: Secondary | ICD-10-CM | POA: Diagnosis not present

## 2017-11-20 DIAGNOSIS — E0590 Thyrotoxicosis, unspecified without thyrotoxic crisis or storm: Secondary | ICD-10-CM | POA: Diagnosis not present

## 2017-11-20 DIAGNOSIS — M810 Age-related osteoporosis without current pathological fracture: Secondary | ICD-10-CM | POA: Diagnosis not present

## 2017-11-20 DIAGNOSIS — G20 Parkinson's disease: Secondary | ICD-10-CM | POA: Diagnosis not present

## 2017-11-20 DIAGNOSIS — N189 Chronic kidney disease, unspecified: Secondary | ICD-10-CM | POA: Diagnosis not present

## 2017-11-20 DIAGNOSIS — J449 Chronic obstructive pulmonary disease, unspecified: Secondary | ICD-10-CM | POA: Diagnosis not present

## 2017-11-20 DIAGNOSIS — I2510 Atherosclerotic heart disease of native coronary artery without angina pectoris: Secondary | ICD-10-CM | POA: Diagnosis not present

## 2017-11-20 DIAGNOSIS — M48061 Spinal stenosis, lumbar region without neurogenic claudication: Secondary | ICD-10-CM | POA: Diagnosis not present

## 2017-11-20 DIAGNOSIS — I739 Peripheral vascular disease, unspecified: Secondary | ICD-10-CM | POA: Diagnosis not present

## 2017-11-21 DIAGNOSIS — E538 Deficiency of other specified B group vitamins: Secondary | ICD-10-CM | POA: Diagnosis not present

## 2017-11-21 DIAGNOSIS — I2510 Atherosclerotic heart disease of native coronary artery without angina pectoris: Secondary | ICD-10-CM | POA: Diagnosis not present

## 2017-11-21 DIAGNOSIS — E43 Unspecified severe protein-calorie malnutrition: Secondary | ICD-10-CM | POA: Diagnosis not present

## 2017-11-21 DIAGNOSIS — G20 Parkinson's disease: Secondary | ICD-10-CM | POA: Diagnosis not present

## 2017-11-21 DIAGNOSIS — M48061 Spinal stenosis, lumbar region without neurogenic claudication: Secondary | ICD-10-CM | POA: Diagnosis not present

## 2017-11-21 DIAGNOSIS — G4730 Sleep apnea, unspecified: Secondary | ICD-10-CM | POA: Diagnosis not present

## 2017-11-21 DIAGNOSIS — I739 Peripheral vascular disease, unspecified: Secondary | ICD-10-CM | POA: Diagnosis not present

## 2017-11-21 DIAGNOSIS — E0590 Thyrotoxicosis, unspecified without thyrotoxic crisis or storm: Secondary | ICD-10-CM | POA: Diagnosis not present

## 2017-11-21 DIAGNOSIS — I129 Hypertensive chronic kidney disease with stage 1 through stage 4 chronic kidney disease, or unspecified chronic kidney disease: Secondary | ICD-10-CM | POA: Diagnosis not present

## 2017-11-21 DIAGNOSIS — J449 Chronic obstructive pulmonary disease, unspecified: Secondary | ICD-10-CM | POA: Diagnosis not present

## 2017-11-21 DIAGNOSIS — N189 Chronic kidney disease, unspecified: Secondary | ICD-10-CM | POA: Diagnosis not present

## 2017-11-21 DIAGNOSIS — M810 Age-related osteoporosis without current pathological fracture: Secondary | ICD-10-CM | POA: Diagnosis not present

## 2017-11-22 DIAGNOSIS — E0590 Thyrotoxicosis, unspecified without thyrotoxic crisis or storm: Secondary | ICD-10-CM | POA: Diagnosis not present

## 2017-11-22 DIAGNOSIS — G4730 Sleep apnea, unspecified: Secondary | ICD-10-CM | POA: Diagnosis not present

## 2017-11-22 DIAGNOSIS — I2510 Atherosclerotic heart disease of native coronary artery without angina pectoris: Secondary | ICD-10-CM | POA: Diagnosis not present

## 2017-11-22 DIAGNOSIS — E538 Deficiency of other specified B group vitamins: Secondary | ICD-10-CM | POA: Diagnosis not present

## 2017-11-22 DIAGNOSIS — I129 Hypertensive chronic kidney disease with stage 1 through stage 4 chronic kidney disease, or unspecified chronic kidney disease: Secondary | ICD-10-CM | POA: Diagnosis not present

## 2017-11-22 DIAGNOSIS — J449 Chronic obstructive pulmonary disease, unspecified: Secondary | ICD-10-CM | POA: Diagnosis not present

## 2017-11-22 DIAGNOSIS — I739 Peripheral vascular disease, unspecified: Secondary | ICD-10-CM | POA: Diagnosis not present

## 2017-11-22 DIAGNOSIS — M810 Age-related osteoporosis without current pathological fracture: Secondary | ICD-10-CM | POA: Diagnosis not present

## 2017-11-22 DIAGNOSIS — G20 Parkinson's disease: Secondary | ICD-10-CM | POA: Diagnosis not present

## 2017-11-22 DIAGNOSIS — E43 Unspecified severe protein-calorie malnutrition: Secondary | ICD-10-CM | POA: Diagnosis not present

## 2017-11-22 DIAGNOSIS — N189 Chronic kidney disease, unspecified: Secondary | ICD-10-CM | POA: Diagnosis not present

## 2017-11-22 DIAGNOSIS — M48061 Spinal stenosis, lumbar region without neurogenic claudication: Secondary | ICD-10-CM | POA: Diagnosis not present

## 2017-11-26 DIAGNOSIS — M48061 Spinal stenosis, lumbar region without neurogenic claudication: Secondary | ICD-10-CM | POA: Diagnosis not present

## 2017-11-26 DIAGNOSIS — E538 Deficiency of other specified B group vitamins: Secondary | ICD-10-CM | POA: Diagnosis not present

## 2017-11-26 DIAGNOSIS — M810 Age-related osteoporosis without current pathological fracture: Secondary | ICD-10-CM | POA: Diagnosis not present

## 2017-11-26 DIAGNOSIS — G20 Parkinson's disease: Secondary | ICD-10-CM | POA: Diagnosis not present

## 2017-11-26 DIAGNOSIS — I2510 Atherosclerotic heart disease of native coronary artery without angina pectoris: Secondary | ICD-10-CM | POA: Diagnosis not present

## 2017-11-26 DIAGNOSIS — I739 Peripheral vascular disease, unspecified: Secondary | ICD-10-CM | POA: Diagnosis not present

## 2017-11-26 DIAGNOSIS — E0590 Thyrotoxicosis, unspecified without thyrotoxic crisis or storm: Secondary | ICD-10-CM | POA: Diagnosis not present

## 2017-11-26 DIAGNOSIS — G4730 Sleep apnea, unspecified: Secondary | ICD-10-CM | POA: Diagnosis not present

## 2017-11-26 DIAGNOSIS — J449 Chronic obstructive pulmonary disease, unspecified: Secondary | ICD-10-CM | POA: Diagnosis not present

## 2017-11-26 DIAGNOSIS — I129 Hypertensive chronic kidney disease with stage 1 through stage 4 chronic kidney disease, or unspecified chronic kidney disease: Secondary | ICD-10-CM | POA: Diagnosis not present

## 2017-11-26 DIAGNOSIS — N189 Chronic kidney disease, unspecified: Secondary | ICD-10-CM | POA: Diagnosis not present

## 2017-11-26 DIAGNOSIS — E43 Unspecified severe protein-calorie malnutrition: Secondary | ICD-10-CM | POA: Diagnosis not present

## 2017-11-27 ENCOUNTER — Institutional Professional Consult (permissible substitution): Payer: Medicare Other | Admitting: Neurology

## 2017-11-29 DIAGNOSIS — E43 Unspecified severe protein-calorie malnutrition: Secondary | ICD-10-CM | POA: Diagnosis not present

## 2017-11-29 DIAGNOSIS — G4730 Sleep apnea, unspecified: Secondary | ICD-10-CM | POA: Diagnosis not present

## 2017-11-29 DIAGNOSIS — M48061 Spinal stenosis, lumbar region without neurogenic claudication: Secondary | ICD-10-CM | POA: Diagnosis not present

## 2017-11-29 DIAGNOSIS — I2510 Atherosclerotic heart disease of native coronary artery without angina pectoris: Secondary | ICD-10-CM | POA: Diagnosis not present

## 2017-11-29 DIAGNOSIS — E538 Deficiency of other specified B group vitamins: Secondary | ICD-10-CM | POA: Diagnosis not present

## 2017-11-29 DIAGNOSIS — G20 Parkinson's disease: Secondary | ICD-10-CM | POA: Diagnosis not present

## 2017-11-29 DIAGNOSIS — I129 Hypertensive chronic kidney disease with stage 1 through stage 4 chronic kidney disease, or unspecified chronic kidney disease: Secondary | ICD-10-CM | POA: Diagnosis not present

## 2017-11-29 DIAGNOSIS — J449 Chronic obstructive pulmonary disease, unspecified: Secondary | ICD-10-CM | POA: Diagnosis not present

## 2017-11-29 DIAGNOSIS — I739 Peripheral vascular disease, unspecified: Secondary | ICD-10-CM | POA: Diagnosis not present

## 2017-11-29 DIAGNOSIS — M810 Age-related osteoporosis without current pathological fracture: Secondary | ICD-10-CM | POA: Diagnosis not present

## 2017-11-29 DIAGNOSIS — N189 Chronic kidney disease, unspecified: Secondary | ICD-10-CM | POA: Diagnosis not present

## 2017-11-29 DIAGNOSIS — E0590 Thyrotoxicosis, unspecified without thyrotoxic crisis or storm: Secondary | ICD-10-CM | POA: Diagnosis not present

## 2017-12-06 DIAGNOSIS — N189 Chronic kidney disease, unspecified: Secondary | ICD-10-CM | POA: Diagnosis not present

## 2017-12-06 DIAGNOSIS — E538 Deficiency of other specified B group vitamins: Secondary | ICD-10-CM | POA: Diagnosis not present

## 2017-12-06 DIAGNOSIS — E43 Unspecified severe protein-calorie malnutrition: Secondary | ICD-10-CM | POA: Diagnosis not present

## 2017-12-06 DIAGNOSIS — G20 Parkinson's disease: Secondary | ICD-10-CM | POA: Diagnosis not present

## 2017-12-06 DIAGNOSIS — I739 Peripheral vascular disease, unspecified: Secondary | ICD-10-CM | POA: Diagnosis not present

## 2017-12-06 DIAGNOSIS — M48061 Spinal stenosis, lumbar region without neurogenic claudication: Secondary | ICD-10-CM | POA: Diagnosis not present

## 2017-12-06 DIAGNOSIS — I2510 Atherosclerotic heart disease of native coronary artery without angina pectoris: Secondary | ICD-10-CM | POA: Diagnosis not present

## 2017-12-06 DIAGNOSIS — M810 Age-related osteoporosis without current pathological fracture: Secondary | ICD-10-CM | POA: Diagnosis not present

## 2017-12-06 DIAGNOSIS — J449 Chronic obstructive pulmonary disease, unspecified: Secondary | ICD-10-CM | POA: Diagnosis not present

## 2017-12-06 DIAGNOSIS — E0590 Thyrotoxicosis, unspecified without thyrotoxic crisis or storm: Secondary | ICD-10-CM | POA: Diagnosis not present

## 2017-12-06 DIAGNOSIS — G4730 Sleep apnea, unspecified: Secondary | ICD-10-CM | POA: Diagnosis not present

## 2017-12-06 DIAGNOSIS — I129 Hypertensive chronic kidney disease with stage 1 through stage 4 chronic kidney disease, or unspecified chronic kidney disease: Secondary | ICD-10-CM | POA: Diagnosis not present

## 2017-12-13 DIAGNOSIS — M5416 Radiculopathy, lumbar region: Secondary | ICD-10-CM | POA: Diagnosis not present

## 2017-12-13 DIAGNOSIS — E7800 Pure hypercholesterolemia, unspecified: Secondary | ICD-10-CM | POA: Diagnosis not present

## 2017-12-13 DIAGNOSIS — D539 Nutritional anemia, unspecified: Secondary | ICD-10-CM | POA: Diagnosis not present

## 2017-12-13 DIAGNOSIS — Z79899 Other long term (current) drug therapy: Secondary | ICD-10-CM | POA: Diagnosis not present

## 2017-12-13 DIAGNOSIS — E559 Vitamin D deficiency, unspecified: Secondary | ICD-10-CM | POA: Diagnosis not present

## 2017-12-13 DIAGNOSIS — G8929 Other chronic pain: Secondary | ICD-10-CM | POA: Diagnosis not present

## 2017-12-20 ENCOUNTER — Encounter (HOSPITAL_COMMUNITY): Payer: Self-pay

## 2017-12-20 ENCOUNTER — Ambulatory Visit: Payer: Self-pay | Admitting: Family

## 2017-12-25 NOTE — Telephone Encounter (Signed)
Entered in error

## 2018-01-10 DIAGNOSIS — M5416 Radiculopathy, lumbar region: Secondary | ICD-10-CM | POA: Diagnosis not present

## 2018-01-10 DIAGNOSIS — G8929 Other chronic pain: Secondary | ICD-10-CM | POA: Diagnosis not present

## 2018-01-10 DIAGNOSIS — E7800 Pure hypercholesterolemia, unspecified: Secondary | ICD-10-CM | POA: Diagnosis not present

## 2018-01-10 DIAGNOSIS — Z79899 Other long term (current) drug therapy: Secondary | ICD-10-CM | POA: Diagnosis not present

## 2018-01-17 ENCOUNTER — Encounter (HOSPITAL_COMMUNITY): Payer: Self-pay

## 2018-01-17 ENCOUNTER — Ambulatory Visit: Payer: Medicare Other | Admitting: Family

## 2018-02-05 ENCOUNTER — Institutional Professional Consult (permissible substitution): Payer: Medicare Other | Admitting: Neurology

## 2018-02-06 DIAGNOSIS — E7800 Pure hypercholesterolemia, unspecified: Secondary | ICD-10-CM | POA: Diagnosis not present

## 2018-02-06 DIAGNOSIS — G8929 Other chronic pain: Secondary | ICD-10-CM | POA: Diagnosis not present

## 2018-02-06 DIAGNOSIS — Z79899 Other long term (current) drug therapy: Secondary | ICD-10-CM | POA: Diagnosis not present

## 2018-02-06 DIAGNOSIS — I10 Essential (primary) hypertension: Secondary | ICD-10-CM | POA: Diagnosis not present

## 2018-02-06 DIAGNOSIS — M5416 Radiculopathy, lumbar region: Secondary | ICD-10-CM | POA: Diagnosis not present

## 2018-02-26 ENCOUNTER — Institutional Professional Consult (permissible substitution): Payer: Medicare Other | Admitting: Neurology

## 2018-03-06 DIAGNOSIS — M5416 Radiculopathy, lumbar region: Secondary | ICD-10-CM | POA: Diagnosis not present

## 2018-03-06 DIAGNOSIS — E7800 Pure hypercholesterolemia, unspecified: Secondary | ICD-10-CM | POA: Diagnosis not present

## 2018-03-06 DIAGNOSIS — G8929 Other chronic pain: Secondary | ICD-10-CM | POA: Diagnosis not present

## 2018-03-06 DIAGNOSIS — Z79899 Other long term (current) drug therapy: Secondary | ICD-10-CM | POA: Diagnosis not present

## 2018-03-13 DIAGNOSIS — I5032 Chronic diastolic (congestive) heart failure: Secondary | ICD-10-CM | POA: Diagnosis not present

## 2018-03-13 DIAGNOSIS — J181 Lobar pneumonia, unspecified organism: Secondary | ICD-10-CM | POA: Diagnosis not present

## 2018-03-13 DIAGNOSIS — J449 Chronic obstructive pulmonary disease, unspecified: Secondary | ICD-10-CM | POA: Diagnosis not present

## 2018-03-13 DIAGNOSIS — R5383 Other fatigue: Secondary | ICD-10-CM | POA: Diagnosis not present

## 2018-03-13 DIAGNOSIS — E1122 Type 2 diabetes mellitus with diabetic chronic kidney disease: Secondary | ICD-10-CM | POA: Diagnosis not present

## 2018-03-13 DIAGNOSIS — R600 Localized edema: Secondary | ICD-10-CM | POA: Diagnosis not present

## 2018-03-13 DIAGNOSIS — E1142 Type 2 diabetes mellitus with diabetic polyneuropathy: Secondary | ICD-10-CM | POA: Diagnosis not present

## 2018-03-17 ENCOUNTER — Ambulatory Visit: Payer: Medicare Other | Admitting: Family

## 2018-03-17 ENCOUNTER — Encounter (HOSPITAL_COMMUNITY): Payer: Self-pay

## 2018-03-20 DIAGNOSIS — I5032 Chronic diastolic (congestive) heart failure: Secondary | ICD-10-CM | POA: Diagnosis not present

## 2018-03-20 DIAGNOSIS — R600 Localized edema: Secondary | ICD-10-CM | POA: Diagnosis not present

## 2018-03-25 ENCOUNTER — Encounter: Payer: Self-pay | Admitting: Neurology

## 2018-03-26 ENCOUNTER — Institutional Professional Consult (permissible substitution): Payer: Medicare Other | Admitting: Neurology

## 2018-03-27 ENCOUNTER — Encounter: Payer: Self-pay | Admitting: Neurology

## 2018-04-18 ENCOUNTER — Ambulatory Visit: Payer: Medicare Other | Admitting: Family

## 2018-04-18 ENCOUNTER — Inpatient Hospital Stay (HOSPITAL_COMMUNITY): Admission: RE | Admit: 2018-04-18 | Payer: Self-pay | Source: Ambulatory Visit

## 2018-05-06 DIAGNOSIS — M5416 Radiculopathy, lumbar region: Secondary | ICD-10-CM | POA: Diagnosis not present

## 2018-05-06 DIAGNOSIS — G8929 Other chronic pain: Secondary | ICD-10-CM | POA: Diagnosis not present

## 2018-05-06 DIAGNOSIS — I10 Essential (primary) hypertension: Secondary | ICD-10-CM | POA: Diagnosis not present

## 2018-05-06 DIAGNOSIS — E7800 Pure hypercholesterolemia, unspecified: Secondary | ICD-10-CM | POA: Diagnosis not present

## 2018-05-06 DIAGNOSIS — Z79899 Other long term (current) drug therapy: Secondary | ICD-10-CM | POA: Diagnosis not present

## 2018-05-14 ENCOUNTER — Encounter (HOSPITAL_COMMUNITY): Payer: Self-pay

## 2018-05-14 ENCOUNTER — Observation Stay (HOSPITAL_COMMUNITY)
Admission: EM | Admit: 2018-05-14 | Discharge: 2018-05-17 | Disposition: A | Discharge status: Home Health | Payer: Medicare Other | Attending: Internal Medicine | Admitting: Internal Medicine

## 2018-05-14 ENCOUNTER — Emergency Department (HOSPITAL_COMMUNITY): Payer: Medicare Other

## 2018-05-14 ENCOUNTER — Emergency Department (HOSPITAL_BASED_OUTPATIENT_CLINIC_OR_DEPARTMENT_OTHER): Payer: Medicare Other

## 2018-05-14 ENCOUNTER — Other Ambulatory Visit: Payer: Self-pay

## 2018-05-14 DIAGNOSIS — F419 Anxiety disorder, unspecified: Secondary | ICD-10-CM | POA: Diagnosis not present

## 2018-05-14 DIAGNOSIS — I10 Essential (primary) hypertension: Secondary | ICD-10-CM | POA: Diagnosis not present

## 2018-05-14 DIAGNOSIS — D649 Anemia, unspecified: Secondary | ICD-10-CM

## 2018-05-14 DIAGNOSIS — Z8249 Family history of ischemic heart disease and other diseases of the circulatory system: Secondary | ICD-10-CM | POA: Insufficient documentation

## 2018-05-14 DIAGNOSIS — M810 Age-related osteoporosis without current pathological fracture: Secondary | ICD-10-CM | POA: Insufficient documentation

## 2018-05-14 DIAGNOSIS — Z8711 Personal history of peptic ulcer disease: Secondary | ICD-10-CM | POA: Diagnosis not present

## 2018-05-14 DIAGNOSIS — M797 Fibromyalgia: Secondary | ICD-10-CM | POA: Insufficient documentation

## 2018-05-14 DIAGNOSIS — L03116 Cellulitis of left lower limb: Secondary | ICD-10-CM | POA: Diagnosis not present

## 2018-05-14 DIAGNOSIS — M7989 Other specified soft tissue disorders: Secondary | ICD-10-CM | POA: Diagnosis not present

## 2018-05-14 DIAGNOSIS — J449 Chronic obstructive pulmonary disease, unspecified: Secondary | ICD-10-CM | POA: Insufficient documentation

## 2018-05-14 DIAGNOSIS — N179 Acute kidney failure, unspecified: Secondary | ICD-10-CM | POA: Insufficient documentation

## 2018-05-14 DIAGNOSIS — W1839XA Other fall on same level, initial encounter: Secondary | ICD-10-CM | POA: Diagnosis not present

## 2018-05-14 DIAGNOSIS — K219 Gastro-esophageal reflux disease without esophagitis: Secondary | ICD-10-CM | POA: Diagnosis not present

## 2018-05-14 DIAGNOSIS — Z79891 Long term (current) use of opiate analgesic: Secondary | ICD-10-CM | POA: Insufficient documentation

## 2018-05-14 DIAGNOSIS — J45909 Unspecified asthma, uncomplicated: Secondary | ICD-10-CM | POA: Diagnosis not present

## 2018-05-14 DIAGNOSIS — G20 Parkinson's disease: Secondary | ICD-10-CM | POA: Insufficient documentation

## 2018-05-14 DIAGNOSIS — G4733 Obstructive sleep apnea (adult) (pediatric): Secondary | ICD-10-CM | POA: Diagnosis not present

## 2018-05-14 DIAGNOSIS — I129 Hypertensive chronic kidney disease with stage 1 through stage 4 chronic kidney disease, or unspecified chronic kidney disease: Secondary | ICD-10-CM | POA: Diagnosis not present

## 2018-05-14 DIAGNOSIS — Z903 Acquired absence of stomach [part of]: Secondary | ICD-10-CM | POA: Diagnosis not present

## 2018-05-14 DIAGNOSIS — M25561 Pain in right knee: Secondary | ICD-10-CM | POA: Diagnosis not present

## 2018-05-14 DIAGNOSIS — M79609 Pain in unspecified limb: Secondary | ICD-10-CM | POA: Diagnosis not present

## 2018-05-14 DIAGNOSIS — L03115 Cellulitis of right lower limb: Secondary | ICD-10-CM | POA: Diagnosis not present

## 2018-05-14 DIAGNOSIS — M545 Low back pain: Secondary | ICD-10-CM | POA: Diagnosis not present

## 2018-05-14 DIAGNOSIS — R52 Pain, unspecified: Secondary | ICD-10-CM | POA: Diagnosis not present

## 2018-05-14 DIAGNOSIS — E1151 Type 2 diabetes mellitus with diabetic peripheral angiopathy without gangrene: Secondary | ICD-10-CM | POA: Diagnosis not present

## 2018-05-14 DIAGNOSIS — Z833 Family history of diabetes mellitus: Secondary | ICD-10-CM | POA: Insufficient documentation

## 2018-05-14 DIAGNOSIS — L0390 Cellulitis, unspecified: Secondary | ICD-10-CM

## 2018-05-14 DIAGNOSIS — N183 Chronic kidney disease, stage 3 (moderate): Secondary | ICD-10-CM | POA: Diagnosis not present

## 2018-05-14 DIAGNOSIS — E1122 Type 2 diabetes mellitus with diabetic chronic kidney disease: Secondary | ICD-10-CM | POA: Diagnosis not present

## 2018-05-14 DIAGNOSIS — E1149 Type 2 diabetes mellitus with other diabetic neurological complication: Secondary | ICD-10-CM | POA: Insufficient documentation

## 2018-05-14 DIAGNOSIS — F17210 Nicotine dependence, cigarettes, uncomplicated: Secondary | ICD-10-CM | POA: Diagnosis not present

## 2018-05-14 DIAGNOSIS — Z86718 Personal history of other venous thrombosis and embolism: Secondary | ICD-10-CM | POA: Diagnosis not present

## 2018-05-14 DIAGNOSIS — L03119 Cellulitis of unspecified part of limb: Secondary | ICD-10-CM | POA: Diagnosis not present

## 2018-05-14 DIAGNOSIS — S8991XA Unspecified injury of right lower leg, initial encounter: Secondary | ICD-10-CM | POA: Diagnosis not present

## 2018-05-14 DIAGNOSIS — Z9049 Acquired absence of other specified parts of digestive tract: Secondary | ICD-10-CM | POA: Insufficient documentation

## 2018-05-14 DIAGNOSIS — Z7982 Long term (current) use of aspirin: Secondary | ICD-10-CM | POA: Diagnosis not present

## 2018-05-14 DIAGNOSIS — W19XXXA Unspecified fall, initial encounter: Secondary | ICD-10-CM | POA: Diagnosis not present

## 2018-05-14 DIAGNOSIS — Z79899 Other long term (current) drug therapy: Secondary | ICD-10-CM | POA: Insufficient documentation

## 2018-05-14 DIAGNOSIS — I2510 Atherosclerotic heart disease of native coronary artery without angina pectoris: Secondary | ICD-10-CM | POA: Insufficient documentation

## 2018-05-14 LAB — CBC WITH DIFFERENTIAL/PLATELET
Basophils Absolute: 0 10*3/uL (ref 0.0–0.1)
Basophils Relative: 0 %
Eosinophils Absolute: 0.1 10*3/uL (ref 0.0–0.7)
Eosinophils Relative: 2 %
HCT: 33.5 % — ABNORMAL LOW (ref 36.0–46.0)
Hemoglobin: 10.8 g/dL — ABNORMAL LOW (ref 12.0–15.0)
Lymphocytes Relative: 12 %
Lymphs Abs: 0.6 10*3/uL — ABNORMAL LOW (ref 0.7–4.0)
MCH: 31.3 pg (ref 26.0–34.0)
MCHC: 32.2 g/dL (ref 30.0–36.0)
MCV: 97.1 fL (ref 78.0–100.0)
Monocytes Absolute: 0.3 10*3/uL (ref 0.1–1.0)
Monocytes Relative: 7 %
Neutro Abs: 3.5 10*3/uL (ref 1.7–7.7)
Neutrophils Relative %: 79 %
Platelets: 286 10*3/uL (ref 150–400)
RBC: 3.45 MIL/uL — ABNORMAL LOW (ref 3.87–5.11)
RDW: 15.9 % — ABNORMAL HIGH (ref 11.5–15.5)
WBC: 4.6 10*3/uL (ref 4.0–10.5)

## 2018-05-14 LAB — BASIC METABOLIC PANEL
Anion gap: 9 (ref 5–15)
BUN: 37 mg/dL — ABNORMAL HIGH (ref 8–23)
CO2: 21 mmol/L — ABNORMAL LOW (ref 22–32)
Calcium: 8.7 mg/dL — ABNORMAL LOW (ref 8.9–10.3)
Chloride: 111 mmol/L (ref 98–111)
Creatinine, Ser: 1.5 mg/dL — ABNORMAL HIGH (ref 0.44–1.00)
GFR calc Af Amer: 39 mL/min — ABNORMAL LOW (ref >60)
GFR calc non Af Amer: 33 mL/min — ABNORMAL LOW (ref >60)
Glucose, Bld: 95 mg/dL (ref 70–99)
Potassium: 4.4 mmol/L (ref 3.5–5.1)
Sodium: 141 mmol/L (ref 135–145)

## 2018-05-14 LAB — I-STAT TROPONIN, ED: Troponin i, poc: 0 ng/mL (ref 0.00–0.08)

## 2018-05-14 LAB — BRAIN NATRIURETIC PEPTIDE: B Natriuretic Peptide: 116 pg/mL — ABNORMAL HIGH (ref 0.0–100.0)

## 2018-05-14 LAB — I-STAT CG4 LACTIC ACID, ED
Lactic Acid, Venous: 0.68 mmol/L (ref 0.5–1.9)
Lactic Acid, Venous: 0.56 mmol/L (ref 0.5–1.9)

## 2018-05-14 MED ORDER — CLONAZEPAM 1 MG PO TABS
1.0000 mg | ORAL_TABLET | Freq: Two times a day (BID) | ORAL | Status: DC | PRN
  Administered 2018-05-14 – 2018-05-16 (×3): 1 mg via ORAL
  Filled 2018-05-14 (×3): qty 1

## 2018-05-14 MED ORDER — DULOXETINE HCL 20 MG PO CPEP
40.0000 mg | ORAL_CAPSULE | Freq: Every day | ORAL | Status: DC
  Administered 2018-05-15 – 2018-05-17 (×3): 40 mg via ORAL
  Filled 2018-05-14 (×3): qty 2

## 2018-05-14 MED ORDER — VANCOMYCIN HCL IN DEXTROSE 750-5 MG/150ML-% IV SOLN: 750.0000 mg | INTRAVENOUS | Status: DC

## 2018-05-14 MED ORDER — ENOXAPARIN SODIUM 30 MG/0.3ML ~~LOC~~ SOLN
30.0000 mg | SUBCUTANEOUS | Status: DC
  Administered 2018-05-14 – 2018-05-16 (×3): 30 mg via SUBCUTANEOUS
  Filled 2018-05-14 (×4): qty 0.3

## 2018-05-14 MED ORDER — OXYCODONE HCL 5 MG PO TABS
5.0000 mg | ORAL_TABLET | Freq: Four times a day (QID) | ORAL | Status: DC | PRN
  Administered 2018-05-15 – 2018-05-17 (×7): 5 mg via ORAL
  Filled 2018-05-14 (×7): qty 1

## 2018-05-14 MED ORDER — SODIUM CHLORIDE 0.9% FLUSH: 3.0000 mL | INTRAVENOUS | Status: DC | PRN

## 2018-05-14 MED ORDER — CALCIUM ACETATE 667 MG PO CAPS
667.0000 mg | ORAL_CAPSULE | Freq: Two times a day (BID) | ORAL | Status: DC
  Administered 2018-05-15 – 2018-05-17 (×6): 667 mg via ORAL
  Filled 2018-05-14 (×7): qty 1

## 2018-05-14 MED ORDER — OXYCODONE-ACETAMINOPHEN 10-325 MG PO TABS: 1.0000 | ORAL_TABLET | Freq: Four times a day (QID) | ORAL | Status: DC | PRN

## 2018-05-14 MED ORDER — ACETAMINOPHEN 325 MG PO TABS
650.0000 mg | ORAL_TABLET | Freq: Four times a day (QID) | ORAL | Status: DC | PRN
  Administered 2018-05-15 – 2018-05-17 (×3): 650 mg via ORAL
  Filled 2018-05-14 (×3): qty 2

## 2018-05-14 MED ORDER — VANCOMYCIN HCL IN DEXTROSE 1-5 GM/200ML-% IV SOLN
1000.0000 mg | Freq: Once | INTRAVENOUS | Status: AC
  Administered 2018-05-14: 1000 mg via INTRAVENOUS
  Filled 2018-05-14: qty 200

## 2018-05-14 MED ORDER — ASPIRIN 81 MG PO TABS
81.0000 mg | ORAL_TABLET | Freq: Every day | ORAL | Status: DC
  Administered 2018-05-14: 81 mg via ORAL
  Filled 2018-05-14 (×2): qty 1

## 2018-05-14 MED ORDER — PRENATAL 27-0.8 MG PO TABS
1.0000 | ORAL_TABLET | Freq: Every day | ORAL | Status: DC
  Administered 2018-05-15 – 2018-05-17 (×3): 1 via ORAL
  Filled 2018-05-14 (×4): qty 1

## 2018-05-14 MED ORDER — SODIUM CHLORIDE 0.9% FLUSH
3.0000 mL | Freq: Two times a day (BID) | INTRAVENOUS | Status: DC
Start: 2018-05-14 — End: 2018-05-17
  Administered 2018-05-14 – 2018-05-17 (×5): 3 mL via INTRAVENOUS

## 2018-05-14 MED ORDER — CEFAZOLIN SODIUM-DEXTROSE 1-4 GM/50ML-% IV SOLN
1.0000 g | Freq: Once | INTRAVENOUS | Status: AC
  Administered 2018-05-14: 1 g via INTRAVENOUS
  Filled 2018-05-14: qty 50

## 2018-05-14 MED ORDER — ATORVASTATIN CALCIUM 20 MG PO TABS
20.0000 mg | ORAL_TABLET | Freq: Every day | ORAL | Status: DC
  Administered 2018-05-14 – 2018-05-16 (×3): 20 mg via ORAL
  Filled 2018-05-14 (×3): qty 1

## 2018-05-14 MED ORDER — METOPROLOL SUCCINATE ER 25 MG PO TB24
12.5000 mg | ORAL_TABLET | Freq: Every day | ORAL | Status: DC
  Administered 2018-05-14 – 2018-05-17 (×3): 12.5 mg via ORAL
  Filled 2018-05-14 (×4): qty 1

## 2018-05-14 MED ORDER — SODIUM CHLORIDE 0.9 % IV BOLUS
500.0000 mL | Freq: Once | INTRAVENOUS | Status: AC
  Administered 2018-05-14: 500 mL via INTRAVENOUS

## 2018-05-14 MED ORDER — MORPHINE SULFATE (PF) 4 MG/ML IV SOLN
4.0000 mg | Freq: Once | INTRAVENOUS | Status: AC
  Administered 2018-05-14: 4 mg via INTRAVENOUS
  Filled 2018-05-14: qty 1

## 2018-05-14 MED ORDER — POTASSIUM CHLORIDE CRYS ER 20 MEQ PO TBCR
40.0000 meq | EXTENDED_RELEASE_TABLET | Freq: Every day | ORAL | Status: DC
  Administered 2018-05-15: 40 meq via ORAL
  Filled 2018-05-14: qty 2

## 2018-05-14 MED ORDER — OXYCODONE-ACETAMINOPHEN 5-325 MG PO TABS
1.0000 | ORAL_TABLET | Freq: Four times a day (QID) | ORAL | Status: DC | PRN
Start: 2018-05-14 — End: 2018-05-17
  Administered 2018-05-14 – 2018-05-17 (×9): 1 via ORAL
  Filled 2018-05-14 (×10): qty 1

## 2018-05-14 MED ORDER — AMLODIPINE BESYLATE 5 MG PO TABS
10.0000 mg | ORAL_TABLET | Freq: Every day | ORAL | Status: DC
  Administered 2018-05-14 – 2018-05-17 (×3): 10 mg via ORAL
  Filled 2018-05-14 (×4): qty 2

## 2018-05-14 MED ORDER — ACETAMINOPHEN 650 MG RE SUPP: 650.0000 mg | Freq: Four times a day (QID) | RECTAL | Status: DC | PRN

## 2018-05-14 MED ORDER — SODIUM CHLORIDE 0.9 % IV SOLN
1.0000 g | INTRAVENOUS | Status: DC
  Administered 2018-05-14 – 2018-05-15 (×2): 1 g via INTRAVENOUS
  Filled 2018-05-14 (×2): qty 1

## 2018-05-14 MED ORDER — NICOTINE 21 MG/24HR TD PT24
21.0000 mg | MEDICATED_PATCH | Freq: Every day | TRANSDERMAL | Status: DC
  Administered 2018-05-14 – 2018-05-16 (×3): 21 mg via TRANSDERMAL
  Filled 2018-05-14 (×3): qty 1

## 2018-05-14 MED ORDER — ONDANSETRON HCL 4 MG PO TABS
4.0000 mg | ORAL_TABLET | Freq: Three times a day (TID) | ORAL | Status: DC | PRN
  Administered 2018-05-15: 4 mg via ORAL
  Filled 2018-05-14: qty 1

## 2018-05-14 MED ORDER — PANTOPRAZOLE SODIUM 40 MG PO TBEC
40.0000 mg | DELAYED_RELEASE_TABLET | Freq: Two times a day (BID) | ORAL | Status: DC
  Administered 2018-05-14 – 2018-05-17 (×6): 40 mg via ORAL
  Filled 2018-05-14 (×6): qty 1

## 2018-05-14 MED ORDER — SODIUM CHLORIDE 0.9 % IV SOLN: 250.0000 mL | INTRAVENOUS | Status: DC | PRN

## 2018-05-14 NOTE — ED Triage Notes (Signed)
EMS reports from home, fall on Sunday, fell from chair onto right knee, increasing stiffness and swelling over last three days.   BP 158/60 HR 90 Resp 18 SP02 98 RA

## 2018-05-14 NOTE — Progress Notes (Signed)
A consult was received from an ED physician for Vancomycin per pharmacy dosing.  The patient's profile has been reviewed for ht/wt/allergies/indication/available labs.   A one time order has been placed for Vancomycin 1gm, note that last weight was 09/2017 of ~ 41 kg  Further antibiotics/pharmacy consults should be ordered by admitting physician if indicated.                       Thank you, Minda Ditto 05/14/2018  8:09 PM

## 2018-05-14 NOTE — Progress Notes (Signed)
Pharmacy Antibiotic Note  [name] is a 72 y.o. female admitted on 05/14/2018 with cellulitis.  Pharmacy has been consulted for Vancomycin dosing.  Plan: Vancomycin 1gm x1, then 750mg  q48hr Daily SCr  Height: 5' (152.4 cm) IBW/kg (Calculated) : 45.5  Temp (24hrs), Avg:98.5 F (36.9 C), Min:98.5 F (36.9 C), Max:98.5 F (36.9 C)  Recent Labs  Lab 05/14/18 1750 05/14/18 1801 05/14/18 2046  WBC 4.6  --   --   CREATININE 1.50*  --   --   LATICACIDVEN  --  0.68 0.56    CrCl cannot be calculated (Unknown ideal weight.).    No Known Allergies  Antimicrobials this admission: 7/17 Ancef x1  7/17 Vancomycin >>   Dose adjustments this admission:  Microbiology results: Blood Cx ordered - need collection, but 1st doses abx already given U/A ordered  Thank you for allowing pharmacy to be a part of this patient's care.  Minda Ditto PharmD Pager [phone] 05/14/2018, 10:19 PM

## 2018-05-14 NOTE — ED Notes (Signed)
Bed: [facility] Expected date:  Expected time:  Means of arrival:  Comments: 67 F knee pain- fall 2 days ago

## 2018-05-14 NOTE — ED Provider Notes (Signed)
Medical screening examination/treatment/procedure(s) were conducted as a shared visit with non-physician practitioner(s) and myself.  I personally evaluated the patient during the encounter.  None He has had increasing leg pain.  She reports it is the right knee which has hurt since she fell 4 days ago.  She denies recognizing until just today that both of her legs are very red.  Patient is alert and interactive.  Heart regular no gross rub murmur gallop.  Lungs are grossly clear.  Patient has bilateral lower extremity erythema and tenderness of the right knee.  Right knee pain is along the joint line and does not have effusion or erythema .   Patient has bilateral erythema concerning for cellulitis.  No fracture identified from her fall.  She does have persisting knee pain.  However do not suspect septic joint.  Agree with plan of management.   Charlesetta Shanks, MD 05/15/18 [phone]

## 2018-05-14 NOTE — ED Provider Notes (Signed)
[facility] DEPT Provider Note   CSN: [id number] Arrival date & time: 05/14/18  1630     History   Chief Complaint Chief Complaint  Patient presents with  . Fall  . Knee Pain    HPI [name] is a 72 y.o. female with history of COPD, chronic pain, CKD, degenerative disc disease, CAD, DVT, PUD, PVD presents for evaluation of acute onset, progressively worsening right lower extremity pain status post fall on Sunday 4 days ago.  She states that she was a sitting on a stool in her kitchen that had a pillow on it which slipped resulting in a mechanical fall onto the ground in which she landed on her right knee.  She denies head injury or loss of consciousness.  She notes that since that time she has had progressively worsening pain and swelling overlying the right knee radiating all the way down to her toes.  Pain is constant, worsens with any movement.  She uses a cane at baseline to help her ambulate but notes worsening pain with ambulation.  She denies numbness, tingling, or weakness.  She does note progressively worsening lower extremity edema, right worse than left and over the past few days noticed progressively worsening erythema to the anterior aspect of the lower extremities.  She has taken her home medicines including oxycodone without significant relief of her symptoms.  She denies fevers, chills, chest pain, shortness of breath, abdominal pain, nausea, or vomiting.  She currently takes baby aspirin occasionally but is not on any other blood thinners chronically.  The history is provided by the patient.    Past Medical History:  Diagnosis Date  . Anxiety and depression   . Arthritis   . Asthma   . Carotid artery occlusion   . Chronic kidney disease   . Chronic pain    leg and feet  . COPD (chronic obstructive pulmonary disease) ([place])   . Coronary artery disease   . DDD (degenerative disc disease)   . Depression   . Diarrhea    chronic     . DVT (deep venous thrombosis) ([place])   . Fibromyalgia   . GERD (gastroesophageal reflux disease)   . Grave's disease   . Headache   . Hypertension   . OP (osteoporosis)   . Parkinson's disease ([place])   . Peripheral vascular disease ([place])   . PUD (peptic ulcer disease)   . Recurrent upper respiratory infection (URI)   . Rhinitis   . Sleep apnea   . Spinal stenosis of lumbar region 04/23/2013  . Thrombophlebitis   . Tobacco use disorder 04/23/2013  . Vitamin B 12 deficiency     Patient Active Problem List   Diagnosis Date Noted  . Cellulitis 05/14/2018  . Anemia 05/14/2018  . At risk for adverse drug event 10/17/2017  . Salicylate intoxication, undetermined intent, subsequent encounter 10/10/2017  . Acute renal failure superimposed on chronic kidney disease ([place]) 10/10/2017  . Headache 10/10/2017  . Protein-calorie malnutrition, severe 01/26/2016  . Opiate overdose ([place]) 01/25/2016  . Suicide attempt ([place]) 01/25/2016  . Depression   . Dyslipidemia   . Gastroesophageal reflux disease without esophagitis   . COPD (chronic obstructive pulmonary disease) ([place]) 07/29/2015  . Diabetes mellitus with neurological manifestation ([place]) 07/29/2015  . Frequent falls 07/29/2015  . HTN (hypertension) 07/29/2015  . AKI (acute kidney injury) ([place]) 07/29/2015  . SIRS (systemic inflammatory response syndrome) ([place]) 07/29/2015  . Spinal stenosis of lumbar region 04/23/2013  .  Tremor due to multiple drugs 04/23/2013  . Tobacco use disorder 04/23/2013  . COPD exacerbation ([place]) 04/23/2013  . Occlusion and stenosis of carotid artery without mention of cerebral infarction 03/12/2012  . Viral bronchitis-possible h. infl vs Norovirus 11/21/2011  . Pleuritic chest pain 09/18/2011    Past Surgical History:  Procedure Laterality Date  . CAROTID ENDARTERECTOMY Left Sept. 20,2011   cea  . CHOLECYSTECTOMY  1999  . GASTRECTOMY     age 45   Part of small intestin and part of stomach  . LEFT HEART  CATHETERIZATION WITH CORONARY ANGIOGRAM N/A 10/02/2011   Procedure: LEFT HEART CATHETERIZATION WITH CORONARY ANGIOGRAM;  Surgeon: Sueanne Margarita, MD;  Location: [facility] CATH LAB;  Service: Cardiovascular;  Laterality: N/A;     OB History   None      Home Medications    Prior to Admission medications   Medication Sig Start Date End Date Taking? Authorizing Provider  amLODipine (NORVASC) 10 MG tablet Take 1 tablet (10 mg total) by mouth daily. 01/28/16  Yes Thurnell Lose, MD  aspirin 81 MG tablet Take 81 mg by mouth daily.     Yes [provider]  atorvastatin (LIPITOR) 20 MG tablet Take 20 mg by mouth at bedtime. 03/25/18  Yes [provider]  clonazePAM (KLONOPIN) 1 MG tablet Take 1 mg by mouth 2 (two) times daily as needed for anxiety (anxiety). For anxiety 10/16/17 09/29/18 Yes [provider]  DULoxetine HCl 40 MG CPEP Take 40 mg by mouth daily. 10/16/17  Yes Nita Sells, MD  metoprolol succinate (TOPROL XL) 25 MG 24 hr tablet Take 0.5 tablets (12.5 mg total) by mouth daily. 10/16/17 10/16/18 Yes Nita Sells, MD  ondansetron (ZOFRAN) 8 MG tablet Take 0.5 tablets (4 mg total) by mouth every 8 (eight) hours as needed for nausea or vomiting. 02/02/16  Yes Elgergawy, Silver Huguenin, MD  oxyCODONE-acetaminophen (PERCOCET) 10-325 MG tablet TAKE 1 TABLET BY MOUTH 4 TIMES DAILY AS NEEDED 05/06/18  Yes [provider]  pantoprazole (PROTONIX) 40 MG tablet Take 40 mg by mouth 2 (two) times daily. Filled 08-23-17   Yes [provider]  potassium chloride SA (K-DUR,KLOR-CON) 20 MEQ tablet Take 2 tablets (40 mEq total) by mouth daily. 10/16/17  Yes Nita Sells, MD  promethazine (PHENERGAN) 25 MG tablet Take 25 mg by mouth every 6 (six) hours as needed. 03/06/18  Yes [provider]  amoxicillin-clavulanate (AUGMENTIN) 875-125 MG tablet Take 1 tablet by mouth 2 (two) times daily. One po bid x 7 days Patient not taking: Reported on  05/14/2018 10/17/17   Deno Etienne, DO  calcium acetate (PHOSLO) 667 MG capsule Take 667 mg by mouth 2 (two) times daily.     [provider]  guaiFENesin-dextromethorphan (ROBITUSSIN DM) 100-10 MG/5ML syrup Take 5 mLs by mouth every 4 (four) hours as needed for cough. Patient not taking: Reported on 05/14/2018 10/16/17   Nita Sells, MD  nicotine (NICODERM CQ - DOSED IN MG/24 HOURS) 14 mg/24hr patch Place 1 patch (14 mg total) onto the skin daily. Patient not taking: Reported on 05/14/2018 02/02/16   Elgergawy, Silver Huguenin, MD  nitroGLYCERIN (NITROSTAT) 0.4 MG SL tablet Place 0.4 mg under the tongue every 5 (five) minutes as needed. For chest pain     [provider]  oxyCODONE-acetaminophen (PERCOCET/ROXICET) 5-325 MG tablet Take 1 tablet by mouth every 8 (eight) hours as needed for moderate pain. Patient not taking: Reported on 05/14/2018 10/16/17   Nita Sells, MD  Prenatal Vit-Fe Fumarate-FA (PRENATAL PO) Take 1 tablet by mouth daily.    [provider]  traMADol (ULTRAM) 50 MG tablet Take 1 tablet (50 mg total) by mouth every 6 (six) hours as needed for moderate pain. Patient not taking: Reported on 05/14/2018 10/16/17   Nita Sells, MD    Family History Family History  Problem Relation Age of Onset  . Diabetes Mother   . Hyperlipidemia Mother   . Heart attack Mother   . Other Mother        varicose veins,respiratory,stroke  . Heart disease Mother        before age 71  . Hypertension Mother   . Varicose Veins Mother   . Diabetes Father   . Heart disease Father        before age 9  . Hyperlipidemia Father   . Heart attack Father   . Other Father        varicose veins  . Hypertension Father   . Varicose Veins Father   . Diabetes Sister   . Heart disease Sister        before age 34  . Hyperlipidemia Sister   . Heart attack Sister   . Other Sister        varicose veins  . Hypertension Sister   . Varicose Veins Sister   .  Peripheral vascular disease Sister   . Diabetes Sister   . Hyperlipidemia Sister   . Hypertension Sister   . Varicose Veins Sister     Social History Social History   Tobacco Use  . Smoking status: Current Every Day Smoker    Packs/day: 2.00    Years: 50.00    Pack years: 100.00    Types: Cigarettes  . Smokeless tobacco: Never Used  . Tobacco comment: cessation info given and reviewed  Substance Use Topics  . Alcohol use: No    Alcohol/week: 0.0 oz  . Drug use: No     Allergies   Patient has no known allergies.   Review of Systems Review of Systems  Constitutional: Negative for chills and fever.  Respiratory: Negative for shortness of breath.   Cardiovascular: Negative for chest pain.  Gastrointestinal: Negative for abdominal pain, nausea and vomiting.  Musculoskeletal: Positive for arthralgias, back pain, gait problem and joint swelling.  Neurological: Negative for syncope, weakness, numbness and headaches.  All other systems reviewed and are negative.    Physical Exam Updated Vital Signs BP (!) 151/77 (BP Location: Left Arm)   Pulse 81   Temp 98.5 F (36.9 C) (Oral)   Resp 16   Ht 5' (1.524 m)   SpO2 100%   BMI 17.61 kg/m   Physical Exam  Constitutional: She appears well-developed and well-nourished. No distress.  HENT:  Head: Normocephalic and atraumatic.  No battle signs, no raccoon eyes, no rhinorrhea.  No tenderness to palpation of the face or skull.  Eyes: Pupils are equal, round, and reactive to light. Conjunctivae and EOM are normal. Right eye exhibits no discharge. Left eye exhibits no discharge.  Neck: Normal range of motion. Neck supple. No JVD present. No tracheal deviation present.  Cardiovascular: Normal rate, regular rhythm and intact distal pulses.  2+ DP/PT pulses bilaterally.  There is obvious swelling of the right lower extremity as compared to the left.  There is 1+ pitting edema of the lower extremities.  Homans sign present on the  right and the calf is tender.  No palpable cords.  Pulmonary/Chest: Effort normal.  Abdominal: Soft. Bowel sounds are normal. She exhibits no distension. There is no tenderness. There is no guarding.  Musculoskeletal: She exhibits tenderness. She exhibits no edema.  Decreased active range of motion of the right knee secondary to pain.  There is tenderness to palpation along the right tibial plateau and tibial tuberosity with no underlying crepitus or deformity.  No ligamentous laxity or varus or valgus instability noted.  Negative anterior/posterior drawer test.  5/5 strength of BLE major muscle groups.  There is no midline spine tenderness but there is left-sided paralumbar muscle tenderness.  No deformity, crepitus, or step-off noted.  Neurological: She is alert.  Fluent speech with no evidence of dysarthria or aphasia, no facial droop, sensation intact to soft touch of extremities.  Skin: Skin is warm and dry. There is erythema.  There is erythema of the bilateral lower extremities extending from the distal lower extremites to the toes  Psychiatric: She has a normal mood and affect. Her behavior is normal.  Nursing note and vitals reviewed.      ED Treatments / Results  Labs (all labs ordered are listed, but only abnormal results are displayed) Labs Reviewed  BASIC METABOLIC PANEL - Abnormal; Notable for the following components:      Result Value   CO2 21 (*)    BUN 37 (*)    Creatinine, Ser 1.50 (*)    Calcium 8.7 (*)    GFR calc non Af Amer 33 (*)    GFR calc Af Amer 39 (*)    All other components within normal limits  CBC WITH DIFFERENTIAL/PLATELET - Abnormal; Notable for the following components:   RBC 3.45 (*)    Hemoglobin 10.8 (*)    HCT 33.5 (*)    RDW 15.9 (*)    Lymphs Abs 0.6 (*)    All other components within normal limits  BRAIN NATRIURETIC PEPTIDE - Abnormal; Notable for the following components:   B Natriuretic Peptide 116.0 (*)    All other components within  normal limits  CULTURE, BLOOD (ROUTINE X 2)  CULTURE, BLOOD (ROUTINE X 2)  URINALYSIS, ROUTINE W REFLEX MICROSCOPIC  COMPREHENSIVE METABOLIC PANEL  CBC  I-STAT CG4 LACTIC ACID, ED  I-STAT CG4 LACTIC ACID, ED  I-STAT TROPONIN, ED    EKG None  Radiology Dg Chest 2 View  Result Date: 05/14/2018 CLINICAL DATA:  COPD, asthma, leg swelling EXAM: CHEST - 2 VIEW COMPARISON:  11/14/2017 FINDINGS: Chronic interstitial markings. Left lower lobe scarring. No focal consolidation. No pleural effusion or pneumothorax. The heart is normal in size. Visualized osseous structures are within normal limits. Surgical clips at the GE junction. IMPRESSION: No evidence of acute cardiopulmonary disease. Electronically Signed   By: Julian Hy M.D.   On: 05/14/2018 20:16   Dg Lumbar Spine Complete  Result Date: 05/14/2018 CLINICAL DATA:  72 year old female with a history of fall and lower back pain EXAM: LUMBAR SPINE - COMPLETE 4+ VIEW COMPARISON:  CT 01/11/2015 FINDINGS: Osteopenia. Frontal view demonstrates significant left apex scoliotic curvature, centered at L2-L3. Degree of curvature is relatively unchanged from the prior CT. Within the limitations secondary to the curvature, there is no acute fracture line identified. Oblique images demonstrate no evidence of displaced pars defect. Vacuum disc phenomenon is present at L2-L3, L3-L4, L4-L5, with associated endplate sclerosis and disc space narrowing. Degenerative changes are more pronounced along the right aspect of the vertebral bodies, at the concave curvature. Facet hypertrophy is most pronounced at the L3-S1 levels. Atherosclerotic calcifications  of the abdominal aorta. Cholecystectomy IMPRESSION: No evidence of acute fracture of the lumbar spine. Left apex curvature with associated degenerative disc disease. Electronically Signed   By: Corrie Mckusick D.O.   On: 05/14/2018 17:48   Dg Tibia/fibula Right  Result Date: 05/14/2018 CLINICAL DATA:  Fall EXAM:  RIGHT TIBIA AND FIBULA - 2 VIEW COMPARISON:  None. FINDINGS: No fracture or dislocation is seen. Mild degenerative changes of the tibiotalar joint. The visualized soft tissues are unremarkable. IMPRESSION: No fracture or dislocation is seen. Mild degenerative changes of the ankle. Electronically Signed   By: Julian Hy M.D.   On: 05/14/2018 17:47   Dg Knee Complete 4 Views Right  Result Date: 05/14/2018 CLINICAL DATA:  Fall, right knee pain EXAM: RIGHT KNEE - COMPLETE 4+ VIEW COMPARISON:  None. FINDINGS: No fracture or dislocation is seen. The joint spaces are preserved. Trace suprapatellar knee joint effusion. IMPRESSION: No fracture or dislocation is seen. Trace suprapatellar knee joint effusion. Electronically Signed   By: Julian Hy M.D.   On: 05/14/2018 17:47    Procedures Procedures (including critical care time)  Medications Ordered in ED Medications  vancomycin (VANCOCIN) IVPB 1000 mg/200 mL premix (1,000 mg Intravenous New Bag/Given 05/14/18 2136)  enoxaparin (LOVENOX) injection 30 mg (has no administration in time range)  sodium chloride flush (NS) 0.9 % injection 3 mL (has no administration in time range)  sodium chloride flush (NS) 0.9 % injection 3 mL (has no administration in time range)  0.9 %  sodium chloride infusion (has no administration in time range)  acetaminophen (TYLENOL) tablet 650 mg (has no administration in time range)    Or  acetaminophen (TYLENOL) suppository 650 mg (has no administration in time range)  cefTRIAXone (ROCEPHIN) 1 g in sodium chloride 0.9 % 100 mL IVPB (has no administration in time range)  amLODipine (NORVASC) tablet 10 mg (has no administration in time range)  aspirin tablet 81 mg (has no administration in time range)  atorvastatin (LIPITOR) tablet 20 mg (has no administration in time range)  calcium acetate (PHOSLO) capsule 667 mg (has no administration in time range)  clonazePAM (KLONOPIN) tablet 1 mg (has no administration in  time range)  DULoxetine (CYMBALTA) DR capsule 40 mg (has no administration in time range)  metoprolol succinate (TOPROL-XL) 24 hr tablet 12.5 mg (has no administration in time range)  ondansetron (ZOFRAN) tablet 4 mg (has no administration in time range)  pantoprazole (PROTONIX) EC tablet 40 mg (has no administration in time range)  potassium chloride SA (K-DUR,KLOR-CON) CR tablet 40 mEq (has no administration in time range)  multivitamin-prenatal tablet 1 tablet (has no administration in time range)  oxyCODONE-acetaminophen (PERCOCET/ROXICET) 5-325 MG per tablet 1 tablet (has no administration in time range)    And  oxyCODONE (Oxy IR/ROXICODONE) immediate release tablet 5 mg (has no administration in time range)  morphine 4 MG/ML injection 4 mg (4 mg Intravenous Given 05/14/18 1801)  sodium chloride 0.9 % bolus 500 mL (0 mLs Intravenous Stopped 05/14/18 1913)  ceFAZolin (ANCEF) IVPB 1 g/50 mL premix (1 g Intravenous New Bag/Given 05/14/18 2038)     Initial Impression / Assessment and Plan / ED Course  I have reviewed the triage vital signs and the nursing notes.  Pertinent labs & imaging results that were available during my care of the patient were reviewed by me and considered in my medical decision making (see chart for details).     Patient presenting for evaluation of right knee pain and right  lower extremity pain status post mechanical fall 4 days ago.  Patient is afebrile, hypertensive while in the ED.  Upon examination, the patient was found to have significant erythema and edema of the bilateral lower extremities, right worse than left.  She does have a history of DVT in the past, will obtain ultrasound to rule out acute DVT.  We will also obtain radiographs to rule out acute osseous abnormality and lab work for evaluation of possible cellulitis.   DVT study is negative although there is evidence of soft tissue edema and cobblestoning per the vascular technician.  This is more  consistent with cellulitis.Lab work reviewed by me shows no leukocytosis, stable anemia.  She does have an elevated creatinine which appears to be progressively worsening.  Lactate is within normal limits.  She does have an elevated BNP suggesting possible CHF exacerbation.  Chest x-ray shows no acute cardiopulmonary abnormalities.  Radiographs of the right lower extremity and spine show no acute osseous abnormalities although there is a suprapatellar joint effusion.  No concern for septic joint though she may benefit from CT scan to evaluate for tibial plateau fracture.  8:38 PM Spoke with Dr. Maudie Mercury with [facility] who agrees to assume care of patient and bring her to the hospital for further evaluation and management.  Will start on empiric antibiotics in the ED.  Patient seen and evaluated by Dr. Johnney Killian who agrees with assessment and plan at this time. Final Clinical Impressions(s) / ED Diagnoses   Final diagnoses:  Bilateral lower leg cellulitis  Acute pain of right knee    ED Discharge Orders    None       Debroah Baller 05/14/18 2214    Charlesetta Shanks, MD 05/15/18 [phone]

## 2018-05-14 NOTE — ED Notes (Signed)
ED TO INPATIENT HANDOFF REPORT  Name/Age/Gender [name] 72 y.o. female  Code Status Code Status History    Date Active Date Inactive Code Status Order ID Comments User Context   10/10/2017 0130 10/16/2017 1644 Full Code [id number]  [name], MD ED   01/25/2016 2021 02/02/2016 1643 Full Code [id number]  [name], MD Inpatient   07/29/2015 0310 07/30/2015 1738 Full Code [id number]  [name], MD Inpatient   11/23/2011 1823 11/25/2011 1339 Full Code [id number]  [name], MD Inpatient      Home/SNF/Other Home  Chief Complaint Knee Pain; Fall  Level of Care/Admitting Diagnosis ED Disposition    ED Disposition Condition Comment   Admit  Hospital Area: [facility] [[id number]]  Level of Care: Med-Surg [16]  Diagnosis: Cellulitis [[id number]]  Admitting Physician: [name] [[id number]]  Attending Physician: [name] [phone]  Estimated length of stay: past midnight tomorrow  Certification:: I certify this patient will need inpatient services for at least 2 midnights  PT Class (Do Not Modify): Inpatient [101]  PT Acc Code (Do Not Modify): Private [1]       Medical History Past Medical History:  Diagnosis Date  . Anxiety and depression   . Arthritis   . Asthma   . Carotid artery occlusion   . Chronic kidney disease   . Chronic pain    leg and feet  . COPD (chronic obstructive pulmonary disease) ([place])   . Coronary artery disease   . DDD (degenerative disc disease)   . Depression   . Diarrhea    chronic   . DVT (deep venous thrombosis) ([place])   . Fibromyalgia   . GERD (gastroesophageal reflux disease)   . Grave's disease   . Headache   . Hypertension   . OP (osteoporosis)   . Parkinson's disease ([place])   . Peripheral vascular disease ([place])   . PUD (peptic ulcer disease)   . Recurrent upper respiratory infection (URI)   . Rhinitis   . Sleep apnea   . Spinal stenosis of lumbar region 04/23/2013  . Thrombophlebitis   . Tobacco use  disorder 04/23/2013  . Vitamin B 12 deficiency     Allergies No Known Allergies  IV Location/Drains/Wounds Patient Lines/Drains/Airways Status   Active Line/Drains/Airways    Name:   Placement date:   Placement time:   Site:   Days:   Peripheral IV 05/14/18 Right Antecubital   05/14/18    1751    Antecubital   less than 1   Urethral Catheter Alvan Dame Latex   10/13/17    1213    Latex   213          Labs/Imaging Results for orders placed or performed during the hospital encounter of 05/14/18 (from the past 48 hour(s))  Basic metabolic panel     Status: Abnormal   Collection Time: 05/14/18  5:50 PM  Result Value Ref Range   Sodium 141 135 - 145 mmol/L   Potassium 4.4 3.5 - 5.1 mmol/L   Chloride 111 98 - 111 mmol/L    Comment: Please note change in reference range.   CO2 21 (L) 22 - 32 mmol/L   Glucose, Bld 95 70 - 99 mg/dL    Comment: Please note change in reference range.   BUN 37 (H) 8 - 23 mg/dL    Comment: Please note change in reference range.   Creatinine, Ser 1.50 (H) 0.44 - 1.00 mg/dL   Calcium  8.7 (L) 8.9 - 10.3 mg/dL   GFR calc non Af Amer 33 (L) >60 mL/min   GFR calc Af Amer 39 (L) >60 mL/min    Comment: (NOTE) The eGFR has been calculated using the CKD EPI equation. This calculation has not been validated in all clinical situations. eGFR's persistently <60 mL/min signify possible Chronic Kidney Disease.    Anion gap 9 5 - 15    Comment: Performed at [facility], [place] [address] [place], [place] [address]  CBC with Differential     Status: Abnormal   Collection Time: 05/14/18  5:50 PM  Result Value Ref Range   WBC 4.6 4.0 - 10.5 K/uL   RBC 3.45 (L) 3.87 - 5.11 MIL/uL   Hemoglobin 10.8 (L) 12.0 - 15.0 g/dL   HCT 33.5 (L) 36.0 - 46.0 %   MCV 97.1 78.0 - 100.0 fL   MCH 31.3 26.0 - 34.0 pg   MCHC 32.2 30.0 - 36.0 g/dL   RDW 15.9 (H) 11.5 - 15.5 %   Platelets 286 150 - 400 K/uL   Neutrophils Relative % 79 %   Neutro Abs 3.5 1.7 - 7.7  K/uL   Lymphocytes Relative 12 %   Lymphs Abs 0.6 (L) 0.7 - 4.0 K/uL   Monocytes Relative 7 %   Monocytes Absolute 0.3 0.1 - 1.0 K/uL   Eosinophils Relative 2 %   Eosinophils Absolute 0.1 0.0 - 0.7 K/uL   Basophils Relative 0 %   Basophils Absolute 0.0 0.0 - 0.1 K/uL    Comment: Performed at [facility], [place] [address] [place] [address]  I-Stat CG4 Lactic Acid, ED     Status: None   Collection Time: 05/14/18  6:01 PM  Result Value Ref Range   Lactic Acid, Venous 0.68 0.5 - 1.9 mmol/L  Brain natriuretic peptide     Status: Abnormal   Collection Time: 05/14/18  7:40 PM  Result Value Ref Range   B Natriuretic Peptide 116.0 (H) 0.0 - 100.0 pg/mL    Comment: Performed at [facility], [place] [address], [place], [place] [address]  I-stat troponin, ED     Status: None   Collection Time: 05/14/18  8:42 PM  Result Value Ref Range   Troponin i, poc 0.00 0.00 - 0.08 ng/mL   Comment 3            Comment: Due to the release kinetics of cTnI, a negative result within the first hours of the onset of symptoms does not rule out myocardial infarction with certainty. If myocardial infarction is still suspected, repeat the test at appropriate intervals.   I-Stat CG4 Lactic Acid, ED     Status: None   Collection Time: 05/14/18  8:46 PM  Result Value Ref Range   Lactic Acid, Venous 0.56 0.5 - 1.9 mmol/L   Dg Chest 2 View  Result Date: 05/14/2018 CLINICAL DATA:  COPD, asthma, leg swelling EXAM: CHEST - 2 VIEW COMPARISON:  11/14/2017 FINDINGS: Chronic interstitial markings. Left lower lobe scarring. No focal consolidation. No pleural effusion or pneumothorax. The heart is normal in size. Visualized osseous structures are within normal limits. Surgical clips at the GE junction. IMPRESSION: No evidence of acute cardiopulmonary disease. Electronically Signed   By: Julian Hy M.D.   On: 05/14/2018 20:16   Dg Lumbar Spine Complete  Result Date:  05/14/2018 CLINICAL DATA:  72 year old female with a history of fall and lower back pain EXAM: LUMBAR SPINE -  COMPLETE 4+ VIEW COMPARISON:  CT 01/11/2015 FINDINGS: Osteopenia. Frontal view demonstrates significant left apex scoliotic curvature, centered at L2-L3. Degree of curvature is relatively unchanged from the prior CT. Within the limitations secondary to the curvature, there is no acute fracture line identified. Oblique images demonstrate no evidence of displaced pars defect. Vacuum disc phenomenon is present at L2-L3, L3-L4, L4-L5, with associated endplate sclerosis and disc space narrowing. Degenerative changes are more pronounced along the right aspect of the vertebral bodies, at the concave curvature. Facet hypertrophy is most pronounced at the L3-S1 levels. Atherosclerotic calcifications of the abdominal aorta. Cholecystectomy IMPRESSION: No evidence of acute fracture of the lumbar spine. Left apex curvature with associated degenerative disc disease. Electronically Signed   By: Corrie Mckusick D.O.   On: 05/14/2018 17:48   Dg Tibia/fibula Right  Result Date: 05/14/2018 CLINICAL DATA:  Fall EXAM: RIGHT TIBIA AND FIBULA - 2 VIEW COMPARISON:  None. FINDINGS: No fracture or dislocation is seen. Mild degenerative changes of the tibiotalar joint. The visualized soft tissues are unremarkable. IMPRESSION: No fracture or dislocation is seen. Mild degenerative changes of the ankle. Electronically Signed   By: Julian Hy M.D.   On: 05/14/2018 17:47   Dg Knee Complete 4 Views Right  Result Date: 05/14/2018 CLINICAL DATA:  Fall, right knee pain EXAM: RIGHT KNEE - COMPLETE 4+ VIEW COMPARISON:  None. FINDINGS: No fracture or dislocation is seen. The joint spaces are preserved. Trace suprapatellar knee joint effusion. IMPRESSION: No fracture or dislocation is seen. Trace suprapatellar knee joint effusion. Electronically Signed   By: Julian Hy M.D.   On: 05/14/2018 17:47    Pending Labs Unresulted  Labs (From admission, onward)   Start     Ordered   05/14/18 1940  Urinalysis, Routine w reflex microscopic  STAT,   STAT     05/14/18 1939      Vitals/Pain Today's Vitals   05/14/18 1641 05/14/18 1801 05/14/18 1903 05/14/18 2046  BP: (!) 150/67 (!) 172/77  (!) 167/81  Pulse: 90 88  83  Resp: 18 16  18   Temp: 98.5 F (36.9 C)     TempSrc: Oral     SpO2: 99% 100%  99%  PainSc:   2      Isolation Precautions No active isolations  Medications Medications  ceFAZolin (ANCEF) IVPB 1 g/50 mL premix (has no administration in time range)  vancomycin (VANCOCIN) IVPB 1000 mg/200 mL premix (has no administration in time range)  morphine 4 MG/ML injection 4 mg (4 mg Intravenous Given 05/14/18 1801)  sodium chloride 0.9 % bolus 500 mL (0 mLs Intravenous Stopped 05/14/18 1913)

## 2018-05-14 NOTE — Progress Notes (Signed)
Right lower extremity venous duplex completed. There is no evidence of DVT or Baker's cyst. Raynelle Fujikawa,RVS 05/14/2018, 7:00 pm

## 2018-05-14 NOTE — H&P (Addendum)
TRH H&P   Patient Demographics:    [name], is a 72 y.o. female  MRN: [id number]   DOB - Jul 25, 1946  Admit Date - 05/14/2018  Outpatient Primary MD for the patient is Thressa Sheller, MD  Referring MD/NP/PA:   Charlesetta Shanks  Outpatient Specialists:    Patient coming from: home  Chief Complaint  Patient presents with  . Fall  . Knee Pain      HPI:    [name]  is a 72 y.o. female,   w hypertension, CAD , Parkinsons,  Copd, OSA, Gerd/ PUD presents with c/o fall on right knee and then she noticed redness of the bilateral lower ext today.  Pt denies fever, chills, cough, cp, palp, sob, n/v, diarrhea, brbpr, black stool.    In ED,  CXR  IMPRESSION: No evidence of acute cardiopulmonary disease.  R tib/ fib IMPRESSION: No fracture or dislocation is seen. Mild degenerative changes of the ankle.   Xray R knee IMPRESSION: No fracture or dislocation is seen.  Trace suprapatellar knee joint effusion.  Xray L spine IMPRESSION: No evidence of acute fracture of the lumbar spine.  Left apex curvature with associated degenerative disc disease.  Na 141, K 4.4, Bun 37, Creatinie 1.50 Wbc 4.6, hgb 10.8, Plt 286 Lactic acid 0.68  Trop 0.00 BNP 116.0  Pt will be admitted for cellulitis of bilateral lower ext.      Review of systems:    In addition to the HPI above, No Fever-chills, No Headache, No changes with Vision or hearing, No problems swallowing food or Liquids, No Chest pain, Cough or Shortness of Breath, No Abdominal pain, No Nausea or Vommitting, Bowel movements are regular, No Blood in stool or Urine, No dysuria, No hx of tick bite No new joints pains-aches,  No new weakness, tingling, numbness in any extremity, No recent weight gain or loss, No polyuria, polydypsia or polyphagia, No significant Mental Stressors.  A full 10 point  Review of Systems was done, except as stated above, all other Review of Systems were negative.   With Past History of the following :    Past Medical History:  Diagnosis Date  . Anxiety and depression   . Arthritis   . Asthma   . Carotid artery occlusion   . Chronic kidney disease   . Chronic pain    leg and feet  . COPD (chronic obstructive pulmonary disease) ([place])   . Coronary artery disease   . DDD (degenerative disc disease)   . Depression   . Diarrhea    chronic   . DVT (deep venous thrombosis) ([place])   . Fibromyalgia   . GERD (gastroesophageal reflux disease)   . Grave's disease   . Headache   . Hypertension   . OP (osteoporosis)   . Parkinson's disease ([place])   . Peripheral vascular disease ([place])   . PUD (  peptic ulcer disease)   . Recurrent upper respiratory infection (URI)   . Rhinitis   . Sleep apnea   . Spinal stenosis of lumbar region 04/23/2013  . Thrombophlebitis   . Tobacco use disorder 04/23/2013  . Vitamin B 12 deficiency       Past Surgical History:  Procedure Laterality Date  . CAROTID ENDARTERECTOMY Left Sept. 20,2011   cea  . CHOLECYSTECTOMY  1999  . GASTRECTOMY     age 3   Part of small intestin and part of stomach  . LEFT HEART CATHETERIZATION WITH CORONARY ANGIOGRAM N/A 10/02/2011   Procedure: LEFT HEART CATHETERIZATION WITH CORONARY ANGIOGRAM;  Surgeon: Sueanne Margarita, MD;  Location: [facility] CATH [facility];  Service: Cardiovascular;  Laterality: N/A;      Social History:     Social History   Tobacco Use  . Smoking status: Current Every Day Smoker    Packs/day: 2.00    Years: 50.00    Pack years: 100.00    Types: Cigarettes  . Smokeless tobacco: Never Used  . Tobacco comment: cessation info given and reviewed  Substance Use Topics  . Alcohol use: No    Alcohol/week: 0.0 oz     Lives - at home  Mobility - walks by self   Family History :     Family History  Problem Relation Age of Onset  . Diabetes Mother   . Hyperlipidemia Mother    . Heart attack Mother   . Other Mother        varicose veins,respiratory,stroke  . Heart disease Mother        before age 64  . Hypertension Mother   . Varicose Veins Mother   . Diabetes Father   . Heart disease Father        before age 57  . Hyperlipidemia Father   . Heart attack Father   . Other Father        varicose veins  . Hypertension Father   . Varicose Veins Father   . Diabetes Sister   . Heart disease Sister        before age 57  . Hyperlipidemia Sister   . Heart attack Sister   . Other Sister        varicose veins  . Hypertension Sister   . Varicose Veins Sister   . Peripheral vascular disease Sister   . Diabetes Sister   . Hyperlipidemia Sister   . Hypertension Sister   . Varicose Veins Sister        Home Medications:   Prior to Admission medications   Medication Sig Start Date End Date Taking? Authorizing Provider  amLODipine (NORVASC) 10 MG tablet Take 1 tablet (10 mg total) by mouth daily. 01/28/16  Yes Thurnell Lose, MD  aspirin 81 MG tablet Take 81 mg by mouth daily.     Yes [provider]  atorvastatin (LIPITOR) 20 MG tablet Take 20 mg by mouth at bedtime. 03/25/18  Yes [provider]  clonazePAM (KLONOPIN) 1 MG tablet Take 1 mg by mouth 2 (two) times daily as needed for anxiety (anxiety). For anxiety 10/16/17 09/29/18 Yes [provider]  DULoxetine HCl 40 MG CPEP Take 40 mg by mouth daily. 10/16/17  Yes Nita Sells, MD  metoprolol succinate (TOPROL XL) 25 MG 24 hr tablet Take 0.5 tablets (12.5 mg total) by mouth daily. 10/16/17 10/16/18 Yes Nita Sells, MD  ondansetron (ZOFRAN) 8 MG tablet Take 0.5 tablets (4 mg total) by mouth every  8 (eight) hours as needed for nausea or vomiting. 02/02/16  Yes Elgergawy, Silver Huguenin, MD  oxyCODONE-acetaminophen (PERCOCET) 10-325 MG tablet TAKE 1 TABLET BY MOUTH 4 TIMES DAILY AS NEEDED 05/06/18  Yes [provider]  pantoprazole (PROTONIX) 40 MG tablet Take 40 mg by  mouth 2 (two) times daily. Filled 08-23-17   Yes [provider]  potassium chloride SA (K-DUR,KLOR-CON) 20 MEQ tablet Take 2 tablets (40 mEq total) by mouth daily. 10/16/17  Yes Nita Sells, MD  promethazine (PHENERGAN) 25 MG tablet Take 25 mg by mouth every 6 (six) hours as needed. 03/06/18  Yes [provider]  amoxicillin-clavulanate (AUGMENTIN) 875-125 MG tablet Take 1 tablet by mouth 2 (two) times daily. One po bid x 7 days Patient not taking: Reported on 05/14/2018 10/17/17   Deno Etienne, DO  calcium acetate (PHOSLO) 667 MG capsule Take 667 mg by mouth 2 (two) times daily.     [provider]  guaiFENesin-dextromethorphan (ROBITUSSIN DM) 100-10 MG/5ML syrup Take 5 mLs by mouth every 4 (four) hours as needed for cough. Patient not taking: Reported on 05/14/2018 10/16/17   Nita Sells, MD  nicotine (NICODERM CQ - DOSED IN MG/24 HOURS) 14 mg/24hr patch Place 1 patch (14 mg total) onto the skin daily. Patient not taking: Reported on 05/14/2018 02/02/16   Elgergawy, Silver Huguenin, MD  nitroGLYCERIN (NITROSTAT) 0.4 MG SL tablet Place 0.4 mg under the tongue every 5 (five) minutes as needed. For chest pain     [provider]  oxyCODONE-acetaminophen (PERCOCET/ROXICET) 5-325 MG tablet Take 1 tablet by mouth every 8 (eight) hours as needed for moderate pain. Patient not taking: Reported on 05/14/2018 10/16/17   Nita Sells, MD  Prenatal Vit-Fe Fumarate-FA (PRENATAL PO) Take 1 tablet by mouth daily.    [provider]  traMADol (ULTRAM) 50 MG tablet Take 1 tablet (50 mg total) by mouth every 6 (six) hours as needed for moderate pain. Patient not taking: Reported on 05/14/2018 10/16/17   Nita Sells, MD     Allergies:    No Known Allergies   Physical Exam:   Vitals  Blood pressure (!) 167/81, pulse 83, temperature 98.5 F (36.9 C), temperature source Oral, resp. rate 18, SpO2 99 %.   1. General  lying in bed in NAD,    2.  Normal affect and insight, Not Suicidal or Homicidal, Awake Alert, Oriented X 3.  3. No F.N deficits, ALL C.Nerves Intact, Strength 5/5 all 4 extremities, Sensation intact all 4 extremities, Plantars down going.  4. Ears and Eyes appear Normal, Conjunctivae clear, PERRLA. Moist Oral Mucosa.  5. Supple Neck, No JVD, No cervical lymphadenopathy appriciated, No Carotid Bruits.  6. Symmetrical Chest wall movement, Good air movement bilaterally, CTAB.  7. RRR, No Gallops, Rubs or Murmurs, No Parasternal Heave.  8. Positive Bowel Sounds, Abdomen Soft, No tenderness, No organomegaly appriciated,No rebound -guarding or rigidity.  9.  No Cyanosis,redness of  10. Good muscle tone,  joints appear normal , no effusions, Normal ROM.  11. No Palpable Lymph Nodes in Neck or Axillae     Data Review:    CBC Recent Labs  Lab 05/14/18 1750  WBC 4.6  HGB 10.8*  HCT 33.5*  PLT 286  MCV 97.1  MCH 31.3  MCHC 32.2  RDW 15.9*  LYMPHSABS 0.6*  MONOABS 0.3  EOSABS 0.1  BASOSABS 0.0   ------------------------------------------------------------------------------------------------------------------  Chemistries  Recent Labs  Lab 05/14/18 1750  NA 141  K 4.4  CL 111  CO2 21*  GLUCOSE 95  BUN 37*  CREATININE 1.50*  CALCIUM 8.7*   ------------------------------------------------------------------------------------------------------------------ CrCl cannot be calculated (Unknown ideal weight.). ------------------------------------------------------------------------------------------------------------------ No results for input(s): TSH, T4TOTAL, T3FREE, THYROIDAB in the last 72 hours.  Invalid input(s): FREET3  Coagulation profile No results for input(s): INR, PROTIME in the last 168 hours. ------------------------------------------------------------------------------------------------------------------- No results for input(s): DDIMER in the last 72  hours. -------------------------------------------------------------------------------------------------------------------  Cardiac Enzymes No results for input(s): CKMB, TROPONINI, MYOGLOBIN in the last 168 hours.  Invalid input(s): CK ------------------------------------------------------------------------------------------------------------------    Component Value Date/Time   BNP 116.0 (H) 05/14/2018 1940     ---------------------------------------------------------------------------------------------------------------  Urinalysis    Component Value Date/Time   COLORURINE YELLOW 10/09/2017 2218   APPEARANCEUR CLEAR 10/09/2017 2218   LABSPEC 1.018 10/09/2017 2218   PHURINE 5.0 10/09/2017 2218   GLUCOSEU NEGATIVE 10/09/2017 2218   HGBUR NEGATIVE 10/09/2017 2218   BILIRUBINUR NEGATIVE 10/09/2017 2218   KETONESUR NEGATIVE 10/09/2017 2218   PROTEINUR NEGATIVE 10/09/2017 2218   UROBILINOGEN 0.2 07/28/2015 2223   NITRITE NEGATIVE 10/09/2017 2218   LEUKOCYTESUR NEGATIVE 10/09/2017 2218    ----------------------------------------------------------------------------------------------------------------   Imaging Results:    Dg Chest 2 View  Result Date: 05/14/2018 CLINICAL DATA:  COPD, asthma, leg swelling EXAM: CHEST - 2 VIEW COMPARISON:  11/14/2017 FINDINGS: Chronic interstitial markings. Left lower lobe scarring. No focal consolidation. No pleural effusion or pneumothorax. The heart is normal in size. Visualized osseous structures are within normal limits. Surgical clips at the GE junction. IMPRESSION: No evidence of acute cardiopulmonary disease. Electronically Signed   By: Julian Hy M.D.   On: 05/14/2018 20:16   Dg Lumbar Spine Complete  Result Date: 05/14/2018 CLINICAL DATA:  73 year old female with a history of fall and lower back pain EXAM: LUMBAR SPINE - COMPLETE 4+ VIEW COMPARISON:  CT 01/11/2015 FINDINGS: Osteopenia. Frontal view demonstrates significant left  apex scoliotic curvature, centered at L2-L3. Degree of curvature is relatively unchanged from the prior CT. Within the limitations secondary to the curvature, there is no acute fracture line identified. Oblique images demonstrate no evidence of displaced pars defect. Vacuum disc phenomenon is present at L2-L3, L3-L4, L4-L5, with associated endplate sclerosis and disc space narrowing. Degenerative changes are more pronounced along the right aspect of the vertebral bodies, at the concave curvature. Facet hypertrophy is most pronounced at the L3-S1 levels. Atherosclerotic calcifications of the abdominal aorta. Cholecystectomy IMPRESSION: No evidence of acute fracture of the lumbar spine. Left apex curvature with associated degenerative disc disease. Electronically Signed   By: Corrie Mckusick D.O.   On: 05/14/2018 17:48   Dg Tibia/fibula Right  Result Date: 05/14/2018 CLINICAL DATA:  Fall EXAM: RIGHT TIBIA AND FIBULA - 2 VIEW COMPARISON:  None. FINDINGS: No fracture or dislocation is seen. Mild degenerative changes of the tibiotalar joint. The visualized soft tissues are unremarkable. IMPRESSION: No fracture or dislocation is seen. Mild degenerative changes of the ankle. Electronically Signed   By: Julian Hy M.D.   On: 05/14/2018 17:47   Dg Knee Complete 4 Views Right  Result Date: 05/14/2018 CLINICAL DATA:  Fall, right knee pain EXAM: RIGHT KNEE - COMPLETE 4+ VIEW COMPARISON:  None. FINDINGS: No fracture or dislocation is seen. The joint spaces are preserved. Trace suprapatellar knee joint effusion. IMPRESSION: No fracture or dislocation is seen. Trace suprapatellar knee joint effusion. Electronically Signed   By: Julian Hy M.D.   On: 05/14/2018 17:47       Assessment & Plan:    Principal Problem:  Cellulitis Active Problems:   Diabetes mellitus with neurological manifestation (HCC)   HTN (hypertension)   Anemia    Cellulitis Blood culture x2 vanco iv pharmacy to dose Rocephin  1gm iv qday  Hypertension / CAD Cont amlodipine 10mg  po qday Cont metoprolol XL 12.5mg  po qday Cont Lipitor 20mg  po qhs Cont aspirin 81mg  po qday   Gerd Cont PPI  Anxiety Cont Clonazepam 1mg  po bid prn Cont Cymbalta 40mg  po qday  Nicotine dep Nicotine patch 21mg  topically qday  DVT Prophylaxis Lovenox - SCDs   AM Labs Ordered, also please review Full Orders  Family Communication: Admission, patients condition and plan of care including tests being ordered have been discussed with the patient  who indicate understanding and agree with the plan and Code Status.  Code Status  FULL CODE  Likely DC to  home  Condition GUARDED   Consults called: none  Admission status: inpatient   Time spent in minutes : 50   [name] M.D on 05/14/2018 at 8:49 PM  Between 7am to 7pm - Pager - [phone]   After 7pm go to [email or web address] - password [email or web address]  [facility] - Office  [phone]

## 2018-05-15 ENCOUNTER — Other Ambulatory Visit: Payer: Self-pay

## 2018-05-15 DIAGNOSIS — L03119 Cellulitis of unspecified part of limb: Secondary | ICD-10-CM | POA: Diagnosis not present

## 2018-05-15 DIAGNOSIS — L03115 Cellulitis of right lower limb: Secondary | ICD-10-CM | POA: Diagnosis not present

## 2018-05-15 LAB — COMPREHENSIVE METABOLIC PANEL
ALT: 14 U/L (ref 0–44)
AST: 17 U/L (ref 15–41)
Albumin: 2.5 g/dL — ABNORMAL LOW (ref 3.5–5.0)
Alkaline Phosphatase: 85 U/L (ref 38–126)
Anion gap: 8 (ref 5–15)
BUN: 32 mg/dL — ABNORMAL HIGH (ref 8–23)
CO2: 19 mmol/L — ABNORMAL LOW (ref 22–32)
Calcium: 8 mg/dL — ABNORMAL LOW (ref 8.9–10.3)
Chloride: 113 mmol/L — ABNORMAL HIGH (ref 98–111)
Creatinine, Ser: 1.41 mg/dL — ABNORMAL HIGH (ref 0.44–1.00)
GFR calc Af Amer: 42 mL/min — ABNORMAL LOW (ref >60)
GFR calc non Af Amer: 36 mL/min — ABNORMAL LOW (ref >60)
Glucose, Bld: 101 mg/dL — ABNORMAL HIGH (ref 70–99)
Potassium: 3.9 mmol/L (ref 3.5–5.1)
Sodium: 140 mmol/L (ref 135–145)
Total Bilirubin: 0.6 mg/dL (ref 0.3–1.2)
Total Protein: 5 g/dL — ABNORMAL LOW (ref 6.5–8.1)

## 2018-05-15 LAB — CBC
HCT: 30 % — ABNORMAL LOW (ref 36.0–46.0)
Hemoglobin: 9.8 g/dL — ABNORMAL LOW (ref 12.0–15.0)
MCH: 31.4 pg (ref 26.0–34.0)
MCHC: 32.7 g/dL (ref 30.0–36.0)
MCV: 96.2 fL (ref 78.0–100.0)
Platelets: 243 10*3/uL (ref 150–400)
RBC: 3.12 MIL/uL — ABNORMAL LOW (ref 3.87–5.11)
RDW: 15.9 % — ABNORMAL HIGH (ref 11.5–15.5)
WBC: 3.9 10*3/uL — ABNORMAL LOW (ref 4.0–10.5)

## 2018-05-15 MED ORDER — SODIUM CHLORIDE 0.9 % IV SOLN
INTRAVENOUS | Status: DC
  Administered 2018-05-15: 22:00:00 via INTRAVENOUS

## 2018-05-15 MED ORDER — PROMETHAZINE HCL 25 MG/ML IJ SOLN: 12.5000 mg | Freq: Four times a day (QID) | INTRAMUSCULAR | Status: DC | PRN

## 2018-05-15 MED ORDER — ASPIRIN EC 81 MG PO TBEC
81.0000 mg | DELAYED_RELEASE_TABLET | Freq: Every day | ORAL | Status: DC
  Administered 2018-05-15 – 2018-05-17 (×3): 81 mg via ORAL
  Filled 2018-05-15 (×3): qty 1

## 2018-05-15 NOTE — Progress Notes (Signed)
PROGRESS NOTE    [name]  NIO:[id number] DOB: Mar 09, 1946 DOA: 05/14/2018 PCP: Thressa Sheller, MD     Brief Narrative:  [name] is a 72 year old female with past medical history of hypertension, coronary artery disease, Parkinson's disease, COPD, OSA, GERD who states that she was sitting on a stool when she slid off and fell to her right side on Sunday.  She was unable to get her jeans off due to pain in her knee.  When she got to the emergency department, she noticed redness of her bilateral lower legs.  The redness was not present on Saturday.  In the ED, x-ray of right leg, right knee and lumbar spine revealed no acute injuries.  Chest x-ray without acute cardiopulmonary disease.  Patient was admitted for bilateral lower extremity cellulitis.  New events last 24 hours / Subjective: Continues to have pain in her right leg.  Assessment & Plan:   Principal Problem:   Cellulitis Active Problems:   Diabetes mellitus with neurological manifestation (HCC)   HTN (hypertension)   Anemia   Lower extremity cellulitis -Blood cultures pending -Lower extremity Dopplers negative -Stop vancomycin -Continue Rocephin  Fall -PT OT eval   AKI on CKD III -Baseline creatinine 1-1.2 -IVF -Trend BMP   Essential hypertension -Continue amlodipine, toprol  Coronary artery disease -Continue aspirin, Lipitor  GERD -Continue PPI  Anxiety/fibromyalgia -Continue Cymbalta, Klonopin  Tobacco abuse -Nicotine patch   DVT prophylaxis: Lovenox Code Status: Full Family Communication: No family at bedside Disposition Plan: Pending clinical improvement, PT OT eval    Consultants:   none  Procedures:   none  Antimicrobials:  Anti-infectives (From admission, onward)   Start     Dose/Rate Route Frequency Ordered Stop   05/16/18 2200  vancomycin (VANCOCIN) IVPB 750 mg/150 ml premix  Status:  Discontinued     750 mg 150 mL/hr over 60 Minutes Intravenous Every 48 hours  05/14/18 2218 05/15/18 1146   05/14/18 2230  cefTRIAXone (ROCEPHIN) 1 g in sodium chloride 0.9 % 100 mL IVPB     1 g 200 mL/hr over 30 Minutes Intravenous Every 24 hours 05/14/18 2131     07 /17/19 2015  vancomycin (VANCOCIN) IVPB 1000 mg/200 mL premix     1,000 mg 200 mL/hr over 60 Minutes Intravenous  Once 05/14/18 2008 05/14/18 2236   05/14/18 1945  ceFAZolin (ANCEF) IVPB 1 g/50 mL premix     1 g 100 mL/hr over 30 Minutes Intravenous  Once 05/14/18 1940 05/14/18 2108       Objective: Vitals:   05/14/18 2046 05/14/18 2203 05/15/18 0500 05/15/18 0500  BP: (!) 167/81 (!) 151/77  (!) 111/59  Pulse: 83 81  66  Resp: 18 16  16   Temp:  98.5 F (36.9 C)    TempSrc:  Oral    SpO2: 99% 100%  97%  Weight:  45.5 kg (100 lb 3.2 oz) 45.5 kg (100 lb 3.2 oz)   Height:  5' (1.524 m)      Intake/Output Summary (Last 24 hours) at 05/15/2018 1150 Last data filed at 05/15/2018 1112 Gross per 24 hour  Intake 337 ml  Output 800 ml  Net -463 ml   Filed Weights   05/14/18 2203 05/15/18 0500  Weight: 45.5 kg (100 lb 3.2 oz) 45.5 kg (100 lb 3.2 oz)    Examination:  General exam: Appears calm and comfortable  Respiratory system: Clear to auscultation. Respiratory effort normal. Cardiovascular system: S1 & S2 heard, RRR. No JVD, murmurs,  rubs, gallops or clicks. No pedal edema. Gastrointestinal system: Abdomen is nondistended, soft and nontender. No organomegaly or masses felt. Normal bowel sounds heard. Central nervous system: Alert and oriented. No focal neurological deficits. Extremities: Symmetric, no effusion or injury noted to right knee  Skin: +bilateral lower extremities with erythema and warm extending from ankles proximally  Psychiatry: Judgement and insight appear normal. Mood & affect appropriate.   Data Reviewed: I have personally reviewed following labs and imaging studies  CBC: Recent Labs  Lab 05/14/18 1750 05/15/18 0559  WBC 4.6 3.9*  NEUTROABS 3.5  --   HGB 10.8* 9.8*    HCT 33.5* 30.0*  MCV 97.1 96.2  PLT 286 742   Basic Metabolic Panel: Recent Labs  Lab 05/14/18 1750 05/15/18 0559  NA 141 140  K 4.4 3.9  CL 111 113*  CO2 21* 19*  GLUCOSE 95 101*  BUN 37* 32*  CREATININE 1.50* 1.41*  CALCIUM 8.7* 8.0*   GFR: Estimated Creatinine Clearance: 25.5 mL/min (A) (by C-G formula based on SCr of 1.41 mg/dL (H)). Liver Function Tests: Recent Labs  Lab 05/15/18 0559  AST 17  ALT 14  ALKPHOS 85  BILITOT 0.6  PROT 5.0*  ALBUMIN 2.5*   No results for input(s): LIPASE, AMYLASE in the last 168 hours. No results for input(s): AMMONIA in the last 168 hours. Coagulation Profile: No results for input(s): INR, PROTIME in the last 168 hours. Cardiac Enzymes: No results for input(s): CKTOTAL, CKMB, CKMBINDEX, TROPONINI in the last 168 hours. BNP (last 3 results) No results for input(s): PROBNP in the last 8760 hours. HbA1C: No results for input(s): HGBA1C in the last 72 hours. CBG: No results for input(s): GLUCAP in the last 168 hours. Lipid Profile: No results for input(s): CHOL, HDL, LDLCALC, TRIG, CHOLHDL, LDLDIRECT in the last 72 hours. Thyroid Function Tests: No results for input(s): TSH, T4TOTAL, FREET4, T3FREE, THYROIDAB in the last 72 hours. Anemia Panel: No results for input(s): VITAMINB12, FOLATE, FERRITIN, TIBC, IRON, RETICCTPCT in the last 72 hours. Sepsis Labs: Recent Labs  Lab 05/14/18 1801 05/14/18 2046  LATICACIDVEN 0.68 0.56    No results found for this or any previous visit (from the past 240 hour(s)).     Radiology Studies: Dg Chest 2 View  Result Date: 05/14/2018 CLINICAL DATA:  COPD, asthma, leg swelling EXAM: CHEST - 2 VIEW COMPARISON:  11/14/2017 FINDINGS: Chronic interstitial markings. Left lower lobe scarring. No focal consolidation. No pleural effusion or pneumothorax. The heart is normal in size. Visualized osseous structures are within normal limits. Surgical clips at the GE junction. IMPRESSION: No evidence of  acute cardiopulmonary disease. Electronically Signed   By: Julian Hy M.D.   On: 05/14/2018 20:16   Dg Lumbar Spine Complete  Result Date: 05/14/2018 CLINICAL DATA:  72 year old female with a history of fall and lower back pain EXAM: LUMBAR SPINE - COMPLETE 4+ VIEW COMPARISON:  CT 01/11/2015 FINDINGS: Osteopenia. Frontal view demonstrates significant left apex scoliotic curvature, centered at L2-L3. Degree of curvature is relatively unchanged from the prior CT. Within the limitations secondary to the curvature, there is no acute fracture line identified. Oblique images demonstrate no evidence of displaced pars defect. Vacuum disc phenomenon is present at L2-L3, L3-L4, L4-L5, with associated endplate sclerosis and disc space narrowing. Degenerative changes are more pronounced along the right aspect of the vertebral bodies, at the concave curvature. Facet hypertrophy is most pronounced at the L3-S1 levels. Atherosclerotic calcifications of the abdominal aorta. Cholecystectomy IMPRESSION: No evidence of  acute fracture of the lumbar spine. Left apex curvature with associated degenerative disc disease. Electronically Signed   By: Corrie Mckusick D.O.   On: 05/14/2018 17:48   Dg Tibia/fibula Right  Result Date: 05/14/2018 CLINICAL DATA:  Fall EXAM: RIGHT TIBIA AND FIBULA - 2 VIEW COMPARISON:  None. FINDINGS: No fracture or dislocation is seen. Mild degenerative changes of the tibiotalar joint. The visualized soft tissues are unremarkable. IMPRESSION: No fracture or dislocation is seen. Mild degenerative changes of the ankle. Electronically Signed   By: Julian Hy M.D.   On: 05/14/2018 17:47   Dg Knee Complete 4 Views Right  Result Date: 05/14/2018 CLINICAL DATA:  Fall, right knee pain EXAM: RIGHT KNEE - COMPLETE 4+ VIEW COMPARISON:  None. FINDINGS: No fracture or dislocation is seen. The joint spaces are preserved. Trace suprapatellar knee joint effusion. IMPRESSION: No fracture or dislocation is  seen. Trace suprapatellar knee joint effusion. Electronically Signed   By: Julian Hy M.D.   On: 05/14/2018 17:47      Scheduled Meds: . amLODipine  10 mg Oral Daily  . aspirin EC  81 mg Oral Daily  . atorvastatin  20 mg Oral QHS  . calcium acetate  667 mg Oral BID WC  . DULoxetine  40 mg Oral Daily  . enoxaparin (LOVENOX) injection  30 mg Subcutaneous Q24H  . metoprolol succinate  12.5 mg Oral Daily  . multivitamin-prenatal  1 tablet Oral Daily  . nicotine  21 mg Transdermal QHS  . pantoprazole  40 mg Oral BID  . sodium chloride flush  3 mL Intravenous Q12H   Continuous Infusions: . sodium chloride    . sodium chloride    . cefTRIAXone (ROCEPHIN)  IV Stopped (05/14/18 2327)     LOS: 1 day    Time spent: 35 minutes   Dessa Phi, DO [facility] [email or web address] Password [email or web address] 05/15/2018, 11:50 AM

## 2018-05-16 DIAGNOSIS — L03115 Cellulitis of right lower limb: Secondary | ICD-10-CM | POA: Diagnosis not present

## 2018-05-16 DIAGNOSIS — L03119 Cellulitis of unspecified part of limb: Secondary | ICD-10-CM | POA: Diagnosis not present

## 2018-05-16 LAB — BASIC METABOLIC PANEL
Anion gap: 6 (ref 5–15)
BUN: 21 mg/dL (ref 8–23)
CO2: 21 mmol/L — ABNORMAL LOW (ref 22–32)
Calcium: 8 mg/dL — ABNORMAL LOW (ref 8.9–10.3)
Chloride: 115 mmol/L — ABNORMAL HIGH (ref 98–111)
Creatinine, Ser: 1.19 mg/dL — ABNORMAL HIGH (ref 0.44–1.00)
GFR calc Af Amer: 51 mL/min — ABNORMAL LOW (ref >60)
GFR calc non Af Amer: 44 mL/min — ABNORMAL LOW (ref >60)
Glucose, Bld: 91 mg/dL (ref 70–99)
Potassium: 4.4 mmol/L (ref 3.5–5.1)
Sodium: 142 mmol/L (ref 135–145)

## 2018-05-16 LAB — CBC
HCT: 29.3 % — ABNORMAL LOW (ref 36.0–46.0)
Hemoglobin: 9.3 g/dL — ABNORMAL LOW (ref 12.0–15.0)
MCH: 31.1 pg (ref 26.0–34.0)
MCHC: 31.7 g/dL (ref 30.0–36.0)
MCV: 98 fL (ref 78.0–100.0)
Platelets: 238 10*3/uL (ref 150–400)
RBC: 2.99 MIL/uL — ABNORMAL LOW (ref 3.87–5.11)
RDW: 16.2 % — ABNORMAL HIGH (ref 11.5–15.5)
WBC: 3.4 10*3/uL — ABNORMAL LOW (ref 4.0–10.5)

## 2018-05-16 MED ORDER — CEPHALEXIN 500 MG PO CAPS
500.0000 mg | ORAL_CAPSULE | Freq: Two times a day (BID) | ORAL | Status: DC
  Administered 2018-05-16 – 2018-05-17 (×3): 500 mg via ORAL
  Filled 2018-05-16 (×3): qty 1

## 2018-05-16 NOTE — Care Management Obs Status (Signed)
MEDICARE OBSERVATION STATUS NOTIFICATION   Patient Details  Name: [name] MRN: [id number] Date of Birth: 1946-05-10   Medicare Observation Status Notification Given:  Yes    Guadalupe Maple, RN 05/16/2018, 2:45 PM

## 2018-05-16 NOTE — Evaluation (Signed)
Occupational Therapy Evaluation Patient Details Name: [name] MRN: [id number] DOB: 05/07/1946 Today's Date: 05/16/2018    History of Present Illness pt was admitted for cellulitis, s/p fall from stool.  PMH:  Parkinsons disease, chronic pain, COPD, CAD, HTN, graves disease and spinal stenosis   Clinical Impression   This 72 year old female was admitted for the above. At baseline, she is mod I with basic adls, including bathing by stepping over tub. She has an aide 5 x week for iadsl.  Pt will benefit from continued OT to increase safety and independence with adls.  Goals are for supervision level    Follow Up Recommendations  Home health OT;SNF(vs depending upon progress)    Equipment Recommendations  (tba further)    Recommendations for Other Services       Precautions / Restrictions Precautions Precautions: Fall Restrictions Weight Bearing Restrictions: No      Mobility Bed Mobility Overal bed mobility: Modified Independent             General bed mobility comments: HOB raised; use of rail  Transfers Overall transfer level: Needs assistance Equipment used: Rolling walker (2 wheeled) Transfers: Sit to/from Stand Sit to Stand: Min assist         General transfer comment: light steadying assistance    Balance                                           ADL either performed or assessed with clinical judgement   ADL Overall ADL's : Needs assistance/impaired Eating/Feeding: Modified independent;Sitting(tremor at times)   Grooming: Set up;Sitting   Upper Body Bathing: Set up;Sitting   Lower Body Bathing: Moderate assistance;Sit to/from stand   Upper Body Dressing : Set up;Sitting   Lower Body Dressing: Moderate assistance;Sit to/from stand   Toilet Transfer: Minimal assistance;Ambulation;RW(chair)             General ADL Comments: ambulated around bed to chair.  Extra time; pt pushes herself through pain. Pt tried reacher  and was able to use this.  Will try weighted spoon to see if it she can tolerate it     Vision         Perception     Praxis      Pertinent Vitals/Pain Pain Assessment: Faces Faces Pain Scale: Hurts whole lot Pain Location: back, R knee, L foot, L shoulder Pain Descriptors / Indicators: Aching Pain Intervention(s): Limited activity within patient's tolerance;Monitored during session;Premedicated before session;Repositioned     Hand Dominance Right   Extremity/Trunk Assessment Upper Extremity Assessment Upper Extremity Assessment: Generalized weakness(L shoulder to 80)           Communication Communication Communication: No difficulties   Cognition Arousal/Alertness: Awake/alert Behavior During Therapy: WFL for tasks assessed/performed Overall Cognitive Status: Within Functional Limits for tasks assessed                                     General Comments       Exercises     Shoulder Instructions      Home Living Family/patient expects to be discharged to:: Private residence Living Arrangements: Non-relatives/Friends(roommate)   Type of Home: House Home Access: Level entry           Bathroom Shower/Tub: Tub/shower unit(with grab bars; has been sponge  bathing)   Bathroom Toilet: Standard     Home Equipment: Cane - single point;Shower seat;Walker - 2 wheels          Prior Functioning/Environment Level of Independence: Independent with assistive device(s)        Comments: has an aide 2 1/2 hours x 5 days for housework        OT Problem List: Decreased strength;Decreased activity tolerance;Impaired balance (sitting and/or standing);Decreased knowledge of use of DME or AE;Pain      OT Treatment/Interventions: Self-care/ADL training;DME and/or AE instruction;Patient/family education;Balance training;Therapeutic activities    OT Goals(Current goals can be found in the care plan section) Acute Rehab OT Goals Patient Stated Goal:  none stated OT Goal Formulation: With patient Time For Goal Achievement: 05/30/18 Potential to Achieve Goals: Good ADL Goals Pt Will Perform Lower Body Bathing: with supervision;with adaptive equipment;sit to/from stand Pt Will Perform Lower Body Dressing: with supervision;sit to/from stand;with adaptive equipment Pt Will Transfer to Toilet: with supervision;ambulating;regular height toilet;bedside commode Pt Will Perform Toileting - Clothing Manipulation and hygiene: with supervision;sit to/from stand  OT Frequency: Min 2X/week   Barriers to D/C:            Co-evaluation              AM-PAC PT "6 Clicks" Daily Activity     Outcome Measure Help from another person eating meals?: None Help from another person taking care of personal grooming?: A Little Help from another person toileting, which includes using toliet, bedpan, or urinal?: A Little Help from another person bathing (including washing, rinsing, drying)?: A Lot Help from another person to put on and taking off regular upper body clothing?: A Little Help from another person to put on and taking off regular lower body clothing?: A Lot 6 Click Score: 17   End of Session    Activity Tolerance: Patient tolerated treatment well Patient left: in chair;with call bell/phone within reach;with chair alarm set  OT Visit Diagnosis: Muscle weakness (generalized) (M62.81);Pain Pain - Right/Left: Right Pain - part of body: Knee                Time: 8891-6945 OT Time Calculation (min): 34 min Charges:  OT General Charges $OT Visit: 1 Visit OT Evaluation $OT Eval Moderate Complexity: 1 Mod OT Treatments $Self Care/Home Management : 8-22 mins G-Codes:     [place], OTR/L [phone] 05/16/2018  Shanaya Schneck 05/16/2018, 11:57 AM

## 2018-05-16 NOTE — Progress Notes (Signed)
PROGRESS NOTE    [name]  HER:[id number] DOB: [birth year] DOA: 05/14/2018 PCP: Thressa Sheller, MD     Brief Narrative:  [name] is a 72 year old female with past medical history of hypertension, coronary artery disease, Parkinson's disease, COPD, OSA, GERD who states that she was sitting on a stool when she slid off and fell to her right side on Sunday.  She was unable to get her jeans off due to pain in her knee.  When she got to the emergency department, she noticed redness of her bilateral lower legs.  The redness was not present on Saturday.  In the ED, x-ray of right leg, right knee and lumbar spine revealed no acute injuries.  Chest x-ray without acute cardiopulmonary disease.  Patient was admitted for bilateral lower extremity cellulitis.  New events last 24 hours / Subjective: Swelling and redness of her legs have improved.  Continues to have pain in her right knee.  Eating breakfast without any complaints.  Assessment & Plan:   Principal Problem:   Cellulitis Active Problems:   Diabetes mellitus with neurological manifestation (HCC)   HTN (hypertension)   Anemia   Lower extremity cellulitis -Blood cultures give to date -Lower extremity Dopplers negative -Stop vancomycin -De-escalate Rocephin to Keflex with clinical improvement  Fall -PT OT eval   AKI on CKD III -Baseline creatinine 1-1.2 -Resolved with IV fluids -Trend BMP   Essential hypertension -Continue amlodipine, toprol  Coronary artery disease -Continue aspirin, Lipitor  GERD -Continue PPI  Anxiety/fibromyalgia -Continue Cymbalta, Klonopin  Tobacco abuse -Nicotine patch   DVT prophylaxis: Lovenox Code Status: Full Family Communication: Friend at bedside Disposition Plan: Pending PT OT eval, ?SNF.  Patient resides at home alone   Consultants:   none  Procedures:   none  Antimicrobials:  Anti-infectives (From admission, onward)   Start     Dose/Rate Route Frequency  Ordered Stop   05/16/18 2200  vancomycin (VANCOCIN) IVPB 750 mg/150 ml premix  Status:  Discontinued     750 mg 150 mL/hr over 60 Minutes Intravenous Every 48 hours 05/14/18 2218 05/15/18 1146   05/16/18 1200  cephALEXin (KEFLEX) capsule 500 mg     500 mg Oral 2 times daily 05/16/18 1104     07 /17/19 2230  cefTRIAXone (ROCEPHIN) 1 g in sodium chloride 0.9 % 100 mL IVPB  Status:  Discontinued     1 g 200 mL/hr over 30 Minutes Intravenous Every 24 hours 05/14/18 2131 05/16/18 1104   05/14/18 2015  vancomycin (VANCOCIN) IVPB 1000 mg/200 mL premix     1,000 mg 200 mL/hr over 60 Minutes Intravenous  Once 05/14/18 2008 05/14/18 2236   05/14/18 1945  ceFAZolin (ANCEF) IVPB 1 g/50 mL premix     1 g 100 mL/hr over 30 Minutes Intravenous  Once 05/14/18 1940 05/14/18 2108       Objective: Vitals:   05/15/18 1351 05/15/18 1551 05/15/18 2200 05/16/18 0619  BP: 127/63  126/68 124/70  Pulse: 74  76 72  Resp: 16  15 14   Temp: 98.1 F (36.7 C)  98.2 F (36.8 C) 97.9 F (36.6 C)  TempSrc: Oral  Oral Oral  SpO2: 96% 93% 94% 93%  Weight:    45.8 kg (101 lb)  Height:        Intake/Output Summary (Last 24 hours) at 05/16/2018 1156 Last data filed at 05/16/2018 0600 Gross per 24 hour  Intake 1465 ml  Output -  Net 1465 ml   [employer]  05/14/18 2203 05/15/18 0500 05/16/18 0619  Weight: 45.5 kg (100 lb 3.2 oz) 45.5 kg (100 lb 3.2 oz) 45.8 kg (101 lb)    Examination: General exam: Appears calm and comfortable  Respiratory system: Clear to auscultation. Respiratory effort normal. Cardiovascular system: S1 & S2 heard, RRR. No JVD, murmurs, rubs, gallops or clicks. No pedal edema. Gastrointestinal system: Abdomen is nondistended, soft and nontender. No organomegaly or masses felt. Normal bowel sounds heard. Central nervous system: Alert and oriented. No focal neurological deficits. Extremities: Symmetric 5 x 5 power. Skin: + Erythema of lower extremities have improved Psychiatry:  Judgement and insight appear normal. Mood & affect appropriate.    Data Reviewed: I have personally reviewed following labs and imaging studies  CBC: Recent Labs  Lab 05/14/18 1750 05/15/18 0559 05/16/18 0552  WBC 4.6 3.9* 3.4*  NEUTROABS 3.5  --   --   HGB 10.8* 9.8* 9.3*  HCT 33.5* 30.0* 29.3*  MCV 97.1 96.2 98.0  PLT 286 243 161   Basic Metabolic Panel: Recent Labs  Lab 05/14/18 1750 05/15/18 0559 05/16/18 0552  NA 141 140 142  K 4.4 3.9 4.4  CL 111 113* 115*  CO2 21* 19* 21*  GLUCOSE 95 101* 91  BUN 37* 32* 21  CREATININE 1.50* 1.41* 1.19*  CALCIUM 8.7* 8.0* 8.0*   GFR: Estimated Creatinine Clearance: 30.2 mL/min (A) (by C-G formula based on SCr of 1.19 mg/dL (H)). Liver Function Tests: Recent Labs  Lab 05/15/18 0559  AST 17  ALT 14  ALKPHOS 85  BILITOT 0.6  PROT 5.0*  ALBUMIN 2.5*   No results for input(s): LIPASE, AMYLASE in the last 168 hours. No results for input(s): AMMONIA in the last 168 hours. Coagulation Profile: No results for input(s): INR, PROTIME in the last 168 hours. Cardiac Enzymes: No results for input(s): CKTOTAL, CKMB, CKMBINDEX, TROPONINI in the last 168 hours. BNP (last 3 results) No results for input(s): PROBNP in the last 8760 hours. HbA1C: No results for input(s): HGBA1C in the last 72 hours. CBG: No results for input(s): GLUCAP in the last 168 hours. Lipid Profile: No results for input(s): CHOL, HDL, LDLCALC, TRIG, CHOLHDL, LDLDIRECT in the last 72 hours. Thyroid Function Tests: No results for input(s): TSH, T4TOTAL, FREET4, T3FREE, THYROIDAB in the last 72 hours. Anemia Panel: No results for input(s): VITAMINB12, FOLATE, FERRITIN, TIBC, IRON, RETICCTPCT in the last 72 hours. Sepsis Labs: Recent Labs  Lab 05/14/18 1801 05/14/18 2046  LATICACIDVEN 0.68 0.56    Recent Results (from the past 240 hour(s))  Culture, blood (routine x 2)     Status: None (Preliminary result)   Collection Time: 05/14/18 10:24 PM  Result  Value Ref Range Status   Specimen Description   Final    BLOOD LEFT ANTECUBITAL Performed at [facility] [address], [place], [place] [address]    Special Requests   Final    BOTTLES DRAWN AEROBIC AND ANAEROBIC Blood Culture adequate volume Performed at [facility] [address], [place], [place] [address]    Culture   Final    NO GROWTH 2 DAYS Performed at [facility] [address], [place],  [address]    Report Status PENDING  Incomplete  Culture, blood (routine x 2)     Status: None (Preliminary result)   Collection Time: 05/14/18 10:30 PM  Result Value Ref Range Status   Specimen Description   Final    BLOOD BLOOD LEFT WRIST Performed at  [facility] [place] [address] [place], [place] [address]    Special Requests   Final    AEROBIC BOTTLE ONLY Blood Culture adequate volume Performed at [facility] [address] [place], [place] [address]    Culture   Final    NO GROWTH 2 DAYS Performed at [facility] [address], [place], [place] [address]    Report Status PENDING  Incomplete       Radiology Studies: Dg Chest 2 View  Result Date: 05/14/2018 CLINICAL DATA:  COPD, asthma, leg swelling EXAM: CHEST - 2 VIEW COMPARISON:  11/14/2017 FINDINGS: Chronic interstitial markings. Left lower lobe scarring. No focal consolidation. No pleural effusion or pneumothorax. The heart is normal in size. Visualized osseous structures are within normal limits. Surgical clips at the GE junction. IMPRESSION: No evidence of acute cardiopulmonary disease. Electronically Signed   By: Julian Hy M.D.   On: 05/14/2018 20:16   Dg Lumbar Spine Complete  Result Date: 05/14/2018 CLINICAL DATA:  72 year old female with a history of fall and lower back pain EXAM: LUMBAR SPINE - COMPLETE 4+ VIEW COMPARISON:  CT 01/11/2015 FINDINGS: Osteopenia. Frontal view demonstrates significant left  apex scoliotic curvature, centered at L2-L3. Degree of curvature is relatively unchanged from the prior CT. Within the limitations secondary to the curvature, there is no acute fracture line identified. Oblique images demonstrate no evidence of displaced pars defect. Vacuum disc phenomenon is present at L2-L3, L3-L4, L4-L5, with associated endplate sclerosis and disc space narrowing. Degenerative changes are more pronounced along the right aspect of the vertebral bodies, at the concave curvature. Facet hypertrophy is most pronounced at the L3-S1 levels. Atherosclerotic calcifications of the abdominal aorta. Cholecystectomy IMPRESSION: No evidence of acute fracture of the lumbar spine. Left apex curvature with associated degenerative disc disease. Electronically Signed   By: Corrie Mckusick D.O.   On: 05/14/2018 17:48   Dg Tibia/fibula Right  Result Date: 05/14/2018 CLINICAL DATA:  Fall EXAM: RIGHT TIBIA AND FIBULA - 2 VIEW COMPARISON:  None. FINDINGS: No fracture or dislocation is seen. Mild degenerative changes of the tibiotalar joint. The visualized soft tissues are unremarkable. IMPRESSION: No fracture or dislocation is seen. Mild degenerative changes of the ankle. Electronically Signed   By: Julian Hy M.D.   On: 05/14/2018 17:47   Dg Knee Complete 4 Views Right  Result Date: 05/14/2018 CLINICAL DATA:  Fall, right knee pain EXAM: RIGHT KNEE - COMPLETE 4+ VIEW COMPARISON:  None. FINDINGS: No fracture or dislocation is seen. The joint spaces are preserved. Trace suprapatellar knee joint effusion. IMPRESSION: No fracture or dislocation is seen. Trace suprapatellar knee joint effusion. Electronically Signed   By: Julian Hy M.D.   On: 05/14/2018 17:47      Scheduled Meds: . amLODipine  10 mg Oral Daily  . aspirin EC  81 mg Oral Daily  . atorvastatin  20 mg Oral QHS  . calcium acetate  667 mg Oral BID WC  . cephALEXin  500 mg Oral BID  . DULoxetine  40 mg Oral Daily  . enoxaparin  (LOVENOX) injection  30 mg Subcutaneous Q24H  . metoprolol succinate  12.5 mg Oral Daily  . multivitamin-prenatal  1 tablet Oral Daily  . nicotine  21 mg Transdermal QHS  . pantoprazole  40 mg Oral BID  . sodium chloride flush  3 mL Intravenous Q12H   Continuous Infusions: . sodium chloride       LOS: 2 days  Time spent: 20 minutes   Dessa Phi, DO [facility] [email or web address] Password [email or web address] 05/16/2018, 11:56 AM

## 2018-05-16 NOTE — Evaluation (Signed)
Physical Therapy Evaluation Patient Details Name: [name] MRN: [id number] DOB: 05-May-1946 Today's Date: 05/16/2018   History of Present Illness  pt was admitted for cellulitis, s/p fall from stool.  PMH:  Parkinsons disease, chronic pain, COPD, CAD, HTN, graves disease and spinal stenosis  Clinical Impression  Pt admitted as above and presenting with functional mobility limitations 2* ongoing pain and balance deficits.  Pt hopes to progress to dc home with limited assist of friends.    Follow Up Recommendations Home health PT;SNF(Dependent on acute stay progress and assist at home)    Equipment Recommendations  None recommended by PT    Recommendations for Other Services       Precautions / Restrictions Precautions Precautions: Fall Restrictions Weight Bearing Restrictions: No      Mobility  Bed Mobility Overal bed mobility: Needs Assistance Bed Mobility: Sit to Supine       Sit to supine: Min assist   General bed mobility comments: assist to bring LEs into bed  Transfers Overall transfer level: Needs assistance Equipment used: Rolling walker (2 wheeled) Transfers: Sit to/from Stand Sit to Stand: Min assist         General transfer comment: light steadying assistance  Ambulation/Gait Ambulation/Gait assistance: Min assist;Min guard Gait Distance (Feet): 40 Feet Assistive device: Rolling walker (2 wheeled) Gait Pattern/deviations: Step-to pattern;Decreased step length - right;Decreased step length - left;Decreased stance time - right Gait velocity: decr   General Gait Details: min cues for posture and position from RW; pt ltd WB on R LE 2* pain  Stairs            Wheelchair Mobility    Modified Rankin (Stroke Patients Only)       Balance                                             Pertinent Vitals/Pain Pain Assessment: 0-10 Pain Score: 8  Pain Location: back, R knee, L foot, L shoulder Pain Descriptors /  Indicators: Aching Pain Intervention(s): Limited activity within patient's tolerance;Monitored during session;Premedicated before session;Patient requesting pain meds-RN notified    Home Living Family/patient expects to be discharged to:: Private residence Living Arrangements: Non-relatives/Friends Available Help at Discharge: Friend(s) Type of Home: House Home Access: Stairs to enter   [employer] of Steps: 1 Home Layout: One level;Other (Comment) Home Equipment: Cane - single point;Shower seat;Walker - 2 wheels Additional Comments: Pt has level entry into living room but step up into remainder of home    Prior Function Level of Independence: Independent with assistive device(s)         Comments: has an aide 2 1/2 hours x 5 days for housework     Hand Dominance   Dominant Hand: Right    Extremity/Trunk Assessment   Upper Extremity Assessment Upper Extremity Assessment: Generalized weakness    Lower Extremity Assessment Lower Extremity Assessment: Generalized weakness    Cervical / Trunk Assessment Cervical / Trunk Assessment: Kyphotic  Communication   Communication: No difficulties  Cognition Arousal/Alertness: Awake/alert Behavior During Therapy: WFL for tasks assessed/performed Overall Cognitive Status: Within Functional Limits for tasks assessed                                        General Comments  Exercises     Assessment/Plan    PT Assessment Patient needs continued PT services  PT Problem List Decreased strength;Decreased activity tolerance;Decreased balance;Decreased mobility;Decreased knowledge of use of DME;Pain       PT Treatment Interventions DME instruction;Gait training;Functional mobility training;Therapeutic activities;Therapeutic exercise;Balance training    PT Goals (Current goals can be found in the Care Plan section)  Acute Rehab PT Goals Patient Stated Goal: Move with less pain PT Goal  Formulation: With patient Time For Goal Achievement: 05/23/18 Potential to Achieve Goals: Good    Frequency Min 3X/week   Barriers to discharge        Co-evaluation               AM-PAC PT "6 Clicks" Daily Activity  Outcome Measure Difficulty turning over in bed (including adjusting bedclothes, sheets and blankets)?: A Little Difficulty moving from lying on back to sitting on the side of the bed? : A Lot Difficulty sitting down on and standing up from a chair with arms (e.g., wheelchair, bedside commode, etc,.)?: A Lot Help needed moving to and from a bed to chair (including a wheelchair)?: A Little Help needed walking in hospital room?: A Little Help needed climbing 3-5 steps with a railing? : A Little 6 Click Score: 16    End of Session Equipment Utilized During Treatment: Gait belt Activity Tolerance: Patient limited by pain Patient left: in bed;with call bell/phone within reach Nurse Communication: Mobility status PT Visit Diagnosis: Difficulty in walking, not elsewhere classified (R26.2);Pain Pain - Right/Left: Right Pain - part of body: Knee    Time: 1125-1145 PT Time Calculation (min) (ACUTE ONLY): 20 min   Charges:   PT Evaluation $PT Eval Low Complexity: 1 Low     PT G Codes:        Pg [phone]   Azlyn Wingler 05/16/2018, 12:30 PM

## 2018-05-16 NOTE — Progress Notes (Signed)
PHARMACY NOTE:  ANTIMICROBIAL RENAL DOSAGE ADJUSTMENT  Current antimicrobial regimen includes a mismatch between antimicrobial dosage and estimated renal function.  As per policy approved by the Pharmacy & Therapeutics and Medical Executive Committees, the antimicrobial dosage will be adjusted accordingly.  Current antimicrobial dosage:  Cephalexin 500 mg PO q6h  Indication: Cellulitis  Renal Function:  Estimated Creatinine Clearance: 30.2 mL/min (A) (by C-G formula based on SCr of 1.19 mg/dL (H)).  Antimicrobial dosage has been changed to:  Cephalexin 500 mg PO BID  Thank you for allowing pharmacy to be a part of this patient's care.  Lenis Noon, [facility] 05/16/2018 11:16 AM

## 2018-05-16 NOTE — Progress Notes (Signed)
Spoke with patient at bedside. States she lives at home alone, has friends that check on her. Has an aide every weekday for 2h. States she has a walker but it is too big to fit through doorways, request a walker like she is using here. She would prefer to go home but is agreeable to SNF if she has too. Discussed with her that she might need to increase her hours of care through [facility]. Will send message to PCP to see if he can assist with this. Spoke with PT, they think she will likely improve enough to safely d/c home. Will continue to follow for d/c needs. [phone]

## 2018-05-16 NOTE — Care Management CC44 (Signed)
Condition Code 44 Documentation Completed  Patient Details  Name: [name] MRN: [id number] Date of Birth: 1946-01-31   Condition Code 44 given:  Yes Patient signature on Condition Code 44 notice:  Yes Documentation of 2 MD's agreement:  Yes Code 44 added to claim:  Yes    Guadalupe Maple, RN 05/16/2018, 2:45 PM

## 2018-05-17 DIAGNOSIS — L03115 Cellulitis of right lower limb: Secondary | ICD-10-CM | POA: Diagnosis not present

## 2018-05-17 DIAGNOSIS — L03119 Cellulitis of unspecified part of limb: Secondary | ICD-10-CM | POA: Diagnosis not present

## 2018-05-17 DIAGNOSIS — J441 Chronic obstructive pulmonary disease with (acute) exacerbation: Secondary | ICD-10-CM | POA: Diagnosis not present

## 2018-05-17 LAB — CBC
HCT: 29.7 % — ABNORMAL LOW (ref 36.0–46.0)
Hemoglobin: 9.5 g/dL — ABNORMAL LOW (ref 12.0–15.0)
MCH: 31.5 pg (ref 26.0–34.0)
MCHC: 32 g/dL (ref 30.0–36.0)
MCV: 98.3 fL (ref 78.0–100.0)
Platelets: 256 10*3/uL (ref 150–400)
RBC: 3.02 MIL/uL — ABNORMAL LOW (ref 3.87–5.11)
RDW: 15.9 % — ABNORMAL HIGH (ref 11.5–15.5)
WBC: 3.7 10*3/uL — ABNORMAL LOW (ref 4.0–10.5)

## 2018-05-17 LAB — BASIC METABOLIC PANEL
Anion gap: 6 (ref 5–15)
BUN: 22 mg/dL (ref 8–23)
CO2: 22 mmol/L (ref 22–32)
Calcium: 8.4 mg/dL — ABNORMAL LOW (ref 8.9–10.3)
Chloride: 115 mmol/L — ABNORMAL HIGH (ref 98–111)
Creatinine, Ser: 1.22 mg/dL — ABNORMAL HIGH (ref 0.44–1.00)
GFR calc Af Amer: 50 mL/min — ABNORMAL LOW (ref >60)
GFR calc non Af Amer: 43 mL/min — ABNORMAL LOW (ref >60)
Glucose, Bld: 116 mg/dL — ABNORMAL HIGH (ref 70–99)
Potassium: 4.3 mmol/L (ref 3.5–5.1)
Sodium: 143 mmol/L (ref 135–145)

## 2018-05-17 MED ORDER — CEPHALEXIN 500 MG PO CAPS: 500.0000 mg | ORAL_CAPSULE | Freq: Two times a day (BID) | ORAL | 0 refills | Status: AC

## 2018-05-17 NOTE — Discharge Summary (Signed)
Physician Discharge Summary  [name] UKG:[id number] DOB: 1945-11-28 DOA: 05/14/2018  PCP: [name], MD  Admit date: 05/14/2018 Discharge date: 05/17/2018  Admitted From: Home Disposition:  Home  Recommendations for Outpatient Follow-up:  1. Follow up with PCP in 1 week 2. Please follow up on the following pending results: final blood culture results, negative at time of discharge   Home Health: PT OT RN Aide  Equipment/Devices: Rolling walker  Discharge Condition: Stable CODE STATUS: Full  Diet recommendation: Heart healthy   Brief/Interim Summary: [name] is a 72 year old female with past medical history of hypertension, coronary artery disease, Parkinson's disease, COPD, OSA, GERD who states that she was sitting on a stool when she slid off and fell to her right side on Sunday.  She was unable to get her jeans off due to pain in her knee.  When she got to the emergency department, she noticed redness of her bilateral lower legs.  The redness was not present on Saturday.  In the ED, x-ray of right leg, right knee and lumbar spine revealed no acute injuries.  Chest x-ray without acute cardiopulmonary disease.  Patient was admitted for bilateral lower extremity cellulitis.  She was treated with rocephin which was deescalated to keflex. Erythema continued to improve. PT OT worked with patient and recommended home health services. AKI resolved with IVF.   Discharge Diagnoses:  Principal Problem:   Cellulitis Active Problems:   Diabetes mellitus with neurological manifestation (HCC)   HTN (hypertension)   Anemia  Lower extremity cellulitis -Blood cultures give to date -Lower extremity Dopplers negative -Stop vancomycin -De-escalate Rocephin to Keflex with clinical improvement  Fall -PT OT eval --> home health ordered   AKI on CKD III -Baseline creatinine 1-1.2 -Resolved with IV fluids -Trend BMP. Stable.   Essential hypertension -Continue  amlodipine, toprol  Coronary artery disease -Continue aspirin, Lipitor  GERD -Continue PPI  Anxiety/fibromyalgia -Continue Cymbalta, Klonopin  Tobacco abuse -Nicotine patch    Discharge Instructions  Discharge Instructions    Call MD for:  difficulty breathing, headache or visual disturbances   Complete by:  As directed    Call MD for:  extreme fatigue   Complete by:  As directed    Call MD for:  hives   Complete by:  As directed    Call MD for:  persistant dizziness or light-headedness   Complete by:  As directed    Call MD for:  persistant nausea and vomiting   Complete by:  As directed    Call MD for:  severe uncontrolled pain   Complete by:  As directed    Call MD for:  temperature >100.4   Complete by:  As directed    Diet - low sodium heart healthy   Complete by:  As directed    Discharge instructions   Complete by:  As directed    You were cared for by a hospitalist during your hospital stay. If you have any questions about your discharge medications or the care you received while you were in the hospital after you are discharged, you can call the unit and ask to speak with the hospitalist on call if the hospitalist that took care of you is not available. Once you are discharged, your primary care physician will handle any further medical issues. Please note that NO REFILLS for any discharge medications will be authorized once you are discharged, as it is imperative that you return to your primary care physician (or establish  a relationship with a primary care physician if you do not have one) for your aftercare needs so that they can reassess your need for medications and monitor your lab values.   Increase activity slowly   Complete by:  As directed      Allergies as of 05/17/2018   No Known Allergies     Medication List    STOP taking these medications   amoxicillin-clavulanate 875-125 MG tablet Commonly known as:  AUGMENTIN    guaiFENesin-dextromethorphan 100-10 MG/5ML syrup Commonly known as:  ROBITUSSIN DM   potassium chloride SA 20 MEQ tablet Commonly known as:  K-DUR,KLOR-CON   traMADol 50 MG tablet Commonly known as:  ULTRAM     TAKE these medications   amLODipine 10 MG tablet Commonly known as:  NORVASC Take 1 tablet (10 mg total) by mouth daily.   aspirin 81 MG tablet Take 81 mg by mouth daily.   atorvastatin 20 MG tablet Commonly known as:  LIPITOR Take 20 mg by mouth at bedtime.   calcium acetate 667 MG capsule Commonly known as:  PHOSLO Take 667 mg by mouth 2 (two) times daily.   cephALEXin 500 MG capsule Commonly known as:  KEFLEX Take 1 capsule (500 mg total) by mouth 2 (two) times daily for 5 days.   clonazePAM 1 MG tablet Commonly known as:  KLONOPIN Take 1 mg by mouth 2 (two) times daily as needed for anxiety (anxiety). For anxiety   DULoxetine HCl 40 MG Cpep Take 40 mg by mouth daily.   metoprolol succinate 25 MG 24 hr tablet Commonly known as:  TOPROL XL Take 0.5 tablets (12.5 mg total) by mouth daily.   nicotine 14 mg/24hr patch Commonly known as:  NICODERM CQ - dosed in mg/24 hours Place 1 patch (14 mg total) onto the skin daily.   nitroGLYCERIN 0.4 MG SL tablet Commonly known as:  NITROSTAT Place 0.4 mg under the tongue every 5 (five) minutes as needed. For chest pain   ondansetron 8 MG tablet Commonly known as:  ZOFRAN Take 0.5 tablets (4 mg total) by mouth every 8 (eight) hours as needed for nausea or vomiting.   oxyCODONE-acetaminophen 10-325 MG tablet Commonly known as:  PERCOCET TAKE 1 TABLET BY MOUTH 4 TIMES DAILY AS NEEDED What changed:  Another medication with the same name was removed. Continue taking this medication, and follow the directions you see here.   pantoprazole 40 MG tablet Commonly known as:  PROTONIX Take 40 mg by mouth 2 (two) times daily. Filled 08-23-17   PRENATAL PO Take 1 tablet by mouth daily.   promethazine 25 MG  tablet Commonly known as:  PHENERGAN Take 25 mg by mouth every 6 (six) hours as needed.            Durable Medical Equipment  (From admission, onward)        Start     Ordered   05/16/18 1449  For home use only DME Walker rolling  Once    Question:  Patient needs a walker to treat with the following condition  Answer:  Weakness generalized   05/16/18 1449     Follow-up Information    [name], MD. Go in 1 week(s).   Specialty:  Internal Medicine Contact information: [place] [address] [phone]          No Known Allergies  Consultations:  None   Procedures/Studies: Dg Chest 2 View  Result Date: 05/14/2018 CLINICAL DATA:  COPD,  asthma, leg swelling EXAM: CHEST - 2 VIEW COMPARISON:  11/14/2017 FINDINGS: Chronic interstitial markings. Left lower lobe scarring. No focal consolidation. No pleural effusion or pneumothorax. The heart is normal in size. Visualized osseous structures are within normal limits. Surgical clips at the GE junction. IMPRESSION: No evidence of acute cardiopulmonary disease. Electronically Signed   By: Julian Hy M.D.   On: 05/14/2018 20:16   Dg Lumbar Spine Complete  Result Date: 05/14/2018 CLINICAL DATA:  72 year old female with a history of fall and lower back pain EXAM: LUMBAR SPINE - COMPLETE 4+ VIEW COMPARISON:  CT 01/11/2015 FINDINGS: Osteopenia. Frontal view demonstrates significant left apex scoliotic curvature, centered at L2-L3. Degree of curvature is relatively unchanged from the prior CT. Within the limitations secondary to the curvature, there is no acute fracture line identified. Oblique images demonstrate no evidence of displaced pars defect. Vacuum disc phenomenon is present at L2-L3, L3-L4, L4-L5, with associated endplate sclerosis and disc space narrowing. Degenerative changes are more pronounced along the right aspect of the vertebral bodies, at the concave curvature. Facet  hypertrophy is most pronounced at the L3-S1 levels. Atherosclerotic calcifications of the abdominal aorta. Cholecystectomy IMPRESSION: No evidence of acute fracture of the lumbar spine. Left apex curvature with associated degenerative disc disease. Electronically Signed   By: Corrie Mckusick D.O.   On: 05/14/2018 17:48   Dg Tibia/fibula Right  Result Date: 05/14/2018 CLINICAL DATA:  Fall EXAM: RIGHT TIBIA AND FIBULA - 2 VIEW COMPARISON:  None. FINDINGS: No fracture or dislocation is seen. Mild degenerative changes of the tibiotalar joint. The visualized soft tissues are unremarkable. IMPRESSION: No fracture or dislocation is seen. Mild degenerative changes of the ankle. Electronically Signed   By: Julian Hy M.D.   On: 05/14/2018 17:47   Dg Knee Complete 4 Views Right  Result Date: 05/14/2018 CLINICAL DATA:  Fall, right knee pain EXAM: RIGHT KNEE - COMPLETE 4+ VIEW COMPARISON:  None. FINDINGS: No fracture or dislocation is seen. The joint spaces are preserved. Trace suprapatellar knee joint effusion. IMPRESSION: No fracture or dislocation is seen. Trace suprapatellar knee joint effusion. Electronically Signed   By: Julian Hy M.D.   On: 05/14/2018 17:47      Discharge Exam: Vitals:   05/16/18 2034 05/17/18 0459  BP: 133/76 (!) 119/57  Pulse: 66 65  Resp: 16 16  Temp: 98.1 F (36.7 C) 97.7 F (36.5 C)  SpO2: 99% 95%    General: Pt is alert, awake, not in acute distress Cardiovascular: RRR, S1/S2 +, no rubs, no gallops Respiratory: CTA bilaterally, no wheezing, no rhonchi Abdominal: Soft, NT, ND, bowel sounds + Extremities: no edema, no cyanosis Skin: no erythema bilateral legs     The results of significant diagnostics from this hospitalization (including imaging, microbiology, ancillary and laboratory) are listed below for reference.     Microbiology: Recent Results (from the past 240 hour(s))  Culture, blood (routine x 2)     Status: None (Preliminary result)    Collection Time: 05/14/18 10:24 PM  Result Value Ref Range Status   Specimen Description   Final    BLOOD LEFT ANTECUBITAL Performed at [facility] [address], [place], [place] [address]    Special Requests   Final    BOTTLES DRAWN AEROBIC AND ANAEROBIC Blood Culture adequate volume Performed at [facility] [address] [place], [place] [address]    Culture   Final    NO GROWTH 2 DAYS Performed at [facility]  Lab, [address], [place], [place] [address]    Report Status PENDING  Incomplete  Culture, blood (routine x 2)     Status: None (Preliminary result)   Collection Time: 05/14/18 10:30 PM  Result Value Ref Range Status   Specimen Description   Final    BLOOD BLOOD LEFT WRIST Performed at [facility] [address], [place], [place] [address]    Special Requests   Final    AEROBIC BOTTLE ONLY Blood Culture adequate volume Performed at [facility] [address], [place], [place] [address]    Culture   Final    NO GROWTH 2 DAYS Performed at [facility] [address], [place], [place] [address]    Report Status PENDING  Incomplete     Labs: BNP (last 3 results) Recent Labs    10/17/17 1545 05/14/18 1940  BNP 96.2 244.0*   Basic Metabolic Panel: Recent Labs  Lab 05/14/18 1750 05/15/18 0559 05/16/18 0552 05/17/18 0449  NA 141 140 142 143  K 4.4 3.9 4.4 4.3  CL 111 113* 115* 115*  CO2 21* 19* 21* 22  GLUCOSE 95 101* 91 116*  BUN 37* 32* 21 22  CREATININE 1.50* 1.41* 1.19* 1.22*  CALCIUM 8.7* 8.0* 8.0* 8.4*   Liver Function Tests: Recent Labs  Lab 05/15/18 0559  AST 17  ALT 14  ALKPHOS 85  BILITOT 0.6  PROT 5.0*  ALBUMIN 2.5*   No results for input(s): LIPASE, AMYLASE in the last 168 hours. No results for input(s): AMMONIA in the last 168 hours. CBC: Recent Labs  Lab 05/14/18 1750 05/15/18 0559 05/16/18 0552 05/17/18 0449  WBC 4.6 3.9*  3.4* 3.7*  NEUTROABS 3.5  --   --   --   HGB 10.8* 9.8* 9.3* 9.5*  HCT 33.5* 30.0* 29.3* 29.7*  MCV 97.1 96.2 98.0 98.3  PLT 286 243 238 256   Cardiac Enzymes: No results for input(s): CKTOTAL, CKMB, CKMBINDEX, TROPONINI in the last 168 hours. BNP: Invalid input(s): POCBNP CBG: No results for input(s): GLUCAP in the last 168 hours. D-Dimer No results for input(s): DDIMER in the last 72 hours. Hgb A1c No results for input(s): HGBA1C in the last 72 hours. Lipid Profile No results for input(s): CHOL, HDL, LDLCALC, TRIG, CHOLHDL, LDLDIRECT in the last 72 hours. Thyroid function studies No results for input(s): TSH, T4TOTAL, T3FREE, THYROIDAB in the last 72 hours.  Invalid input(s): FREET3 Anemia work up No results for input(s): VITAMINB12, FOLATE, FERRITIN, TIBC, IRON, RETICCTPCT in the last 72 hours. Urinalysis    Component Value Date/Time   COLORURINE YELLOW 10/09/2017 2218   APPEARANCEUR CLEAR 10/09/2017 2218   LABSPEC 1.018 10/09/2017 2218   PHURINE 5.0 10/09/2017 2218   GLUCOSEU NEGATIVE 10/09/2017 2218   HGBUR NEGATIVE 10/09/2017 2218   BILIRUBINUR NEGATIVE 10/09/2017 2218   KETONESUR NEGATIVE 10/09/2017 2218   PROTEINUR NEGATIVE 10/09/2017 2218   UROBILINOGEN 0.2 07/28/2015 2223   NITRITE NEGATIVE 10/09/2017 2218   LEUKOCYTESUR NEGATIVE 10/09/2017 2218   Sepsis Labs Invalid input(s): PROCALCITONIN,  WBC,  LACTICIDVEN Microbiology Recent Results (from the past 240 hour(s))  Culture, blood (routine x 2)     Status: None (Preliminary result)   Collection Time: 05/14/18 10:24 PM  Result Value Ref Range Status   Specimen Description   Final    BLOOD LEFT ANTECUBITAL Performed at [facility], [place] [address] [place], [place] [address]    Special Requests   Final  BOTTLES DRAWN AEROBIC AND ANAEROBIC Blood Culture adequate volume Performed at [facility] [address] [place], [place] [address]    Culture   Final    NO  GROWTH 2 DAYS Performed at [facility] [address], [place], [place] [address]    Report Status PENDING  Incomplete  Culture, blood (routine x 2)     Status: None (Preliminary result)   Collection Time: 05/14/18 10:30 PM  Result Value Ref Range Status   Specimen Description   Final    BLOOD BLOOD LEFT WRIST Performed at [facility] [address], [place], [place] [address]    Special Requests   Final    AEROBIC BOTTLE ONLY Blood Culture adequate volume Performed at [facility] [address] [place], [place] [address]    Culture   Final    NO GROWTH 2 DAYS Performed at [facility] [address], [place], [place] [address]    Report Status PENDING  Incomplete     Patient was seen and examined on the day of discharge and was found to be in stable condition. Time coordinating discharge: 25 minutes including assessment and coordination of care, as well as examination of the patient.   SIGNED:  Dessa Phi, DO [facility] Pager [phone]  If 7PM-7AM, please contact night-coverage [email or web address] Password [facility] 05/17/2018, 9:49 AM

## 2018-05-17 NOTE — Progress Notes (Signed)
Patient  left the unit at approximately 2002 for home via [facility]. Discharge instructions were given prior to discharge.

## 2018-05-17 NOTE — Progress Notes (Signed)
Occupational Therapy Treatment Patient Details Name: [name] MRN: [id number] DOB: 13-May-1946 Today's Date: 05/17/2018    History of present illness pt was admitted for cellulitis, s/p fall from stool.  PMH:  Parkinsons disease, chronic pain, COPD, CAD, HTN, graves disease and spinal stenosis   OT comments  Pt plans home today.  Issued AE for energy conservation and to decrease pain. Pt tends to push herself through tasks.    Follow Up Recommendations  Home health OT    Equipment Recommendations  None recommended by OT    Recommendations for Other Services      Precautions / Restrictions Precautions Precautions: Fall Restrictions Weight Bearing Restrictions: No       Mobility Bed Mobility               General bed mobility comments: performed sidelying<>sit at mod I level with HOB raised 20 degrees  Transfers                      Balance                                           ADL either performed or assessed with clinical judgement   ADL                       Lower Body Dressing: Minimal assistance;With adaptive equipment                 General ADL Comments: OT had given pt reacher and long sponge yesterday.  Practiced with reacher and sock aide today.  Min A for sock aide.  Also gave her this for home     Vision       Perception     Praxis      Cognition Arousal/Alertness: Awake/alert Behavior During Therapy: WFL for tasks assessed/performed Overall Cognitive Status: Within Functional Limits for tasks assessed                                          Exercises     Shoulder Instructions       General Comments      Pertinent Vitals/ Pain       Faces Pain Scale: Hurts even more Pain Location: back, R knee, L foot, L shoulder Pain Descriptors / Indicators: Aching Pain Intervention(s): Limited activity within patient's tolerance;Monitored during session;Repositioned  Home  Living                                          Prior Functioning/Environment              Frequency           Progress Toward Goals  OT Goals(current goals can now be found in the care plan section)        Plan Discharge plan needs to be updated    Co-evaluation                 AM-PAC PT "6 Clicks" Daily Activity     Outcome Measure  End of Session    OT Visit Diagnosis: Muscle weakness (generalized) (M62.81);Pain Pain - Right/Left: Right Pain - part of body: Knee   Activity Tolerance Patient tolerated treatment well   Patient Left in bed;with call bell/phone within reach   Nurse Communication          Time: 1117-1130 OT Time Calculation (min): 13 min  Charges: OT General Charges $OT Visit: 1 Visit OT Treatments $Self Care/Home Management : 8-22 mins  [name], [name] [phone] 05/17/2018   [name] 05/17/2018, 11:58 AM

## 2018-05-17 NOTE — Progress Notes (Signed)
PTAR arranged per pt request. Explained she may receive a bill. States she does not have anyone that can assist her getting into home.Jonnie Finner RN CCM Case Mgmt phone [phone]

## 2018-05-17 NOTE — Care Management Note (Signed)
Case Management Note  Patient Details  Name: [name] MRN: [id number] Date of Birth: 08-25-1946  Subjective/Objective:    Cellulitis, DM, HTN                Action/Plan: NCM spoke to pt and offered choice for [facility]. Pt agreeable to [facility] for HH. States she has a RW with seat at home. Explained if she received within in past 5 years, [facility] cannot deliver a RW. Contacted [facility] for RW for home, to be delivered to room prior to dc.   Expected Discharge Date:  05/17/18               Expected Discharge Plan:  [place]  In-House Referral:  NA  Discharge planning Services  CM Consult  Post Acute Care Choice:  Durable Medical Equipment, Home Health Choice offered to:  Patient  DME Arranged:  [facility] Agency:  [place] Arranged:  RN, PT, Nurse's Aide, OT [facility] Agency:  [place]  Status of Service:  Completed, signed off  If discussed at [place] of Stay Meetings, dates discussed:    Additional Comments:  [name], RN 05/17/2018, 11:45 AM

## 2018-05-19 LAB — CULTURE, BLOOD (ROUTINE X 2)
Culture: NO GROWTH
Culture: NO GROWTH
Special Requests: ADEQUATE
Special Requests: ADEQUATE

## 2018-05-28 DIAGNOSIS — M810 Age-related osteoporosis without current pathological fracture: Secondary | ICD-10-CM | POA: Diagnosis not present

## 2018-05-28 DIAGNOSIS — I6529 Occlusion and stenosis of unspecified carotid artery: Secondary | ICD-10-CM | POA: Diagnosis not present

## 2018-05-28 DIAGNOSIS — M48061 Spinal stenosis, lumbar region without neurogenic claudication: Secondary | ICD-10-CM | POA: Diagnosis not present

## 2018-05-28 DIAGNOSIS — M79673 Pain in unspecified foot: Secondary | ICD-10-CM | POA: Diagnosis not present

## 2018-05-28 DIAGNOSIS — M5136 Other intervertebral disc degeneration, lumbar region: Secondary | ICD-10-CM | POA: Diagnosis not present

## 2018-05-28 DIAGNOSIS — G8929 Other chronic pain: Secondary | ICD-10-CM | POA: Diagnosis not present

## 2018-05-28 DIAGNOSIS — E1122 Type 2 diabetes mellitus with diabetic chronic kidney disease: Secondary | ICD-10-CM | POA: Diagnosis not present

## 2018-05-28 DIAGNOSIS — D631 Anemia in chronic kidney disease: Secondary | ICD-10-CM | POA: Diagnosis not present

## 2018-05-28 DIAGNOSIS — E1149 Type 2 diabetes mellitus with other diabetic neurological complication: Secondary | ICD-10-CM | POA: Diagnosis not present

## 2018-05-28 DIAGNOSIS — G20 Parkinson's disease: Secondary | ICD-10-CM | POA: Diagnosis not present

## 2018-05-28 DIAGNOSIS — E1151 Type 2 diabetes mellitus with diabetic peripheral angiopathy without gangrene: Secondary | ICD-10-CM | POA: Diagnosis not present

## 2018-05-28 DIAGNOSIS — I129 Hypertensive chronic kidney disease with stage 1 through stage 4 chronic kidney disease, or unspecified chronic kidney disease: Secondary | ICD-10-CM | POA: Diagnosis not present

## 2018-05-28 DIAGNOSIS — J449 Chronic obstructive pulmonary disease, unspecified: Secondary | ICD-10-CM | POA: Diagnosis not present

## 2018-05-28 DIAGNOSIS — N183 Chronic kidney disease, stage 3 (moderate): Secondary | ICD-10-CM | POA: Diagnosis not present

## 2018-05-28 DIAGNOSIS — I700 Atherosclerosis of aorta: Secondary | ICD-10-CM | POA: Diagnosis not present

## 2018-05-28 DIAGNOSIS — I2510 Atherosclerotic heart disease of native coronary artery without angina pectoris: Secondary | ICD-10-CM | POA: Diagnosis not present

## 2018-05-28 DIAGNOSIS — M419 Scoliosis, unspecified: Secondary | ICD-10-CM | POA: Diagnosis not present

## 2018-05-28 DIAGNOSIS — Z7982 Long term (current) use of aspirin: Secondary | ICD-10-CM | POA: Diagnosis not present

## 2018-05-28 DIAGNOSIS — M79606 Pain in leg, unspecified: Secondary | ICD-10-CM | POA: Diagnosis not present

## 2018-05-28 DIAGNOSIS — K219 Gastro-esophageal reflux disease without esophagitis: Secondary | ICD-10-CM | POA: Diagnosis not present

## 2018-05-28 DIAGNOSIS — M19071 Primary osteoarthritis, right ankle and foot: Secondary | ICD-10-CM | POA: Diagnosis not present

## 2018-05-28 DIAGNOSIS — M797 Fibromyalgia: Secondary | ICD-10-CM | POA: Diagnosis not present

## 2018-05-29 DIAGNOSIS — L0390 Cellulitis, unspecified: Secondary | ICD-10-CM | POA: Diagnosis not present

## 2018-05-29 DIAGNOSIS — M150 Primary generalized (osteo)arthritis: Secondary | ICD-10-CM | POA: Diagnosis not present

## 2018-06-02 DIAGNOSIS — M810 Age-related osteoporosis without current pathological fracture: Secondary | ICD-10-CM | POA: Diagnosis not present

## 2018-06-02 DIAGNOSIS — K219 Gastro-esophageal reflux disease without esophagitis: Secondary | ICD-10-CM | POA: Diagnosis not present

## 2018-06-02 DIAGNOSIS — I700 Atherosclerosis of aorta: Secondary | ICD-10-CM | POA: Diagnosis not present

## 2018-06-02 DIAGNOSIS — E1149 Type 2 diabetes mellitus with other diabetic neurological complication: Secondary | ICD-10-CM | POA: Diagnosis not present

## 2018-06-02 DIAGNOSIS — M79673 Pain in unspecified foot: Secondary | ICD-10-CM | POA: Diagnosis not present

## 2018-06-02 DIAGNOSIS — M79606 Pain in leg, unspecified: Secondary | ICD-10-CM | POA: Diagnosis not present

## 2018-06-02 DIAGNOSIS — N183 Chronic kidney disease, stage 3 (moderate): Secondary | ICD-10-CM | POA: Diagnosis not present

## 2018-06-02 DIAGNOSIS — I2510 Atherosclerotic heart disease of native coronary artery without angina pectoris: Secondary | ICD-10-CM | POA: Diagnosis not present

## 2018-06-02 DIAGNOSIS — J449 Chronic obstructive pulmonary disease, unspecified: Secondary | ICD-10-CM | POA: Diagnosis not present

## 2018-06-02 DIAGNOSIS — G20 Parkinson's disease: Secondary | ICD-10-CM | POA: Diagnosis not present

## 2018-06-02 DIAGNOSIS — M797 Fibromyalgia: Secondary | ICD-10-CM | POA: Diagnosis not present

## 2018-06-02 DIAGNOSIS — M19071 Primary osteoarthritis, right ankle and foot: Secondary | ICD-10-CM | POA: Diagnosis not present

## 2018-06-02 DIAGNOSIS — E1151 Type 2 diabetes mellitus with diabetic peripheral angiopathy without gangrene: Secondary | ICD-10-CM | POA: Diagnosis not present

## 2018-06-02 DIAGNOSIS — Z7982 Long term (current) use of aspirin: Secondary | ICD-10-CM | POA: Diagnosis not present

## 2018-06-02 DIAGNOSIS — I129 Hypertensive chronic kidney disease with stage 1 through stage 4 chronic kidney disease, or unspecified chronic kidney disease: Secondary | ICD-10-CM | POA: Diagnosis not present

## 2018-06-02 DIAGNOSIS — M5136 Other intervertebral disc degeneration, lumbar region: Secondary | ICD-10-CM | POA: Diagnosis not present

## 2018-06-02 DIAGNOSIS — M419 Scoliosis, unspecified: Secondary | ICD-10-CM | POA: Diagnosis not present

## 2018-06-02 DIAGNOSIS — M48061 Spinal stenosis, lumbar region without neurogenic claudication: Secondary | ICD-10-CM | POA: Diagnosis not present

## 2018-06-02 DIAGNOSIS — D631 Anemia in chronic kidney disease: Secondary | ICD-10-CM | POA: Diagnosis not present

## 2018-06-02 DIAGNOSIS — E1122 Type 2 diabetes mellitus with diabetic chronic kidney disease: Secondary | ICD-10-CM | POA: Diagnosis not present

## 2018-06-02 DIAGNOSIS — G8929 Other chronic pain: Secondary | ICD-10-CM | POA: Diagnosis not present

## 2018-06-02 DIAGNOSIS — I6529 Occlusion and stenosis of unspecified carotid artery: Secondary | ICD-10-CM | POA: Diagnosis not present

## 2018-06-03 ENCOUNTER — Ambulatory Visit (HOSPITAL_COMMUNITY): Admission: RE | Admit: 2018-06-03 | Payer: Medicare Other | Source: Ambulatory Visit

## 2018-06-03 ENCOUNTER — Ambulatory Visit: Payer: Medicare Other | Admitting: Family

## 2018-06-05 DIAGNOSIS — M5416 Radiculopathy, lumbar region: Secondary | ICD-10-CM | POA: Diagnosis not present

## 2018-06-05 DIAGNOSIS — I10 Essential (primary) hypertension: Secondary | ICD-10-CM | POA: Diagnosis not present

## 2018-06-05 DIAGNOSIS — G8929 Other chronic pain: Secondary | ICD-10-CM | POA: Diagnosis not present

## 2018-06-05 DIAGNOSIS — Z79899 Other long term (current) drug therapy: Secondary | ICD-10-CM | POA: Diagnosis not present

## 2018-06-05 DIAGNOSIS — Z131 Encounter for screening for diabetes mellitus: Secondary | ICD-10-CM | POA: Diagnosis not present

## 2018-06-05 DIAGNOSIS — E0590 Thyrotoxicosis, unspecified without thyrotoxic crisis or storm: Secondary | ICD-10-CM | POA: Diagnosis not present

## 2018-06-05 DIAGNOSIS — M129 Arthropathy, unspecified: Secondary | ICD-10-CM | POA: Diagnosis not present

## 2018-06-05 DIAGNOSIS — E559 Vitamin D deficiency, unspecified: Secondary | ICD-10-CM | POA: Diagnosis not present

## 2018-06-05 DIAGNOSIS — M546 Pain in thoracic spine: Secondary | ICD-10-CM | POA: Diagnosis not present

## 2018-06-05 DIAGNOSIS — Z0000 Encounter for general adult medical examination without abnormal findings: Secondary | ICD-10-CM | POA: Diagnosis not present

## 2018-06-05 DIAGNOSIS — E7800 Pure hypercholesterolemia, unspecified: Secondary | ICD-10-CM | POA: Diagnosis not present

## 2018-06-06 DIAGNOSIS — E1122 Type 2 diabetes mellitus with diabetic chronic kidney disease: Secondary | ICD-10-CM | POA: Diagnosis not present

## 2018-06-06 DIAGNOSIS — M419 Scoliosis, unspecified: Secondary | ICD-10-CM | POA: Diagnosis not present

## 2018-06-06 DIAGNOSIS — I6529 Occlusion and stenosis of unspecified carotid artery: Secondary | ICD-10-CM | POA: Diagnosis not present

## 2018-06-06 DIAGNOSIS — M19071 Primary osteoarthritis, right ankle and foot: Secondary | ICD-10-CM | POA: Diagnosis not present

## 2018-06-06 DIAGNOSIS — M79606 Pain in leg, unspecified: Secondary | ICD-10-CM | POA: Diagnosis not present

## 2018-06-06 DIAGNOSIS — J449 Chronic obstructive pulmonary disease, unspecified: Secondary | ICD-10-CM | POA: Diagnosis not present

## 2018-06-06 DIAGNOSIS — I700 Atherosclerosis of aorta: Secondary | ICD-10-CM | POA: Diagnosis not present

## 2018-06-06 DIAGNOSIS — K219 Gastro-esophageal reflux disease without esophagitis: Secondary | ICD-10-CM | POA: Diagnosis not present

## 2018-06-06 DIAGNOSIS — M48061 Spinal stenosis, lumbar region without neurogenic claudication: Secondary | ICD-10-CM | POA: Diagnosis not present

## 2018-06-06 DIAGNOSIS — M79673 Pain in unspecified foot: Secondary | ICD-10-CM | POA: Diagnosis not present

## 2018-06-06 DIAGNOSIS — M5136 Other intervertebral disc degeneration, lumbar region: Secondary | ICD-10-CM | POA: Diagnosis not present

## 2018-06-06 DIAGNOSIS — M810 Age-related osteoporosis without current pathological fracture: Secondary | ICD-10-CM | POA: Diagnosis not present

## 2018-06-06 DIAGNOSIS — E1151 Type 2 diabetes mellitus with diabetic peripheral angiopathy without gangrene: Secondary | ICD-10-CM | POA: Diagnosis not present

## 2018-06-06 DIAGNOSIS — M797 Fibromyalgia: Secondary | ICD-10-CM | POA: Diagnosis not present

## 2018-06-06 DIAGNOSIS — D631 Anemia in chronic kidney disease: Secondary | ICD-10-CM | POA: Diagnosis not present

## 2018-06-06 DIAGNOSIS — I2510 Atherosclerotic heart disease of native coronary artery without angina pectoris: Secondary | ICD-10-CM | POA: Diagnosis not present

## 2018-06-06 DIAGNOSIS — E1149 Type 2 diabetes mellitus with other diabetic neurological complication: Secondary | ICD-10-CM | POA: Diagnosis not present

## 2018-06-06 DIAGNOSIS — G8929 Other chronic pain: Secondary | ICD-10-CM | POA: Diagnosis not present

## 2018-06-06 DIAGNOSIS — Z7982 Long term (current) use of aspirin: Secondary | ICD-10-CM | POA: Diagnosis not present

## 2018-06-06 DIAGNOSIS — N183 Chronic kidney disease, stage 3 (moderate): Secondary | ICD-10-CM | POA: Diagnosis not present

## 2018-06-06 DIAGNOSIS — I129 Hypertensive chronic kidney disease with stage 1 through stage 4 chronic kidney disease, or unspecified chronic kidney disease: Secondary | ICD-10-CM | POA: Diagnosis not present

## 2018-06-06 DIAGNOSIS — G20 Parkinson's disease: Secondary | ICD-10-CM | POA: Diagnosis not present

## 2018-06-11 DIAGNOSIS — G8929 Other chronic pain: Secondary | ICD-10-CM | POA: Diagnosis not present

## 2018-06-11 DIAGNOSIS — I2510 Atherosclerotic heart disease of native coronary artery without angina pectoris: Secondary | ICD-10-CM | POA: Diagnosis not present

## 2018-06-11 DIAGNOSIS — M797 Fibromyalgia: Secondary | ICD-10-CM | POA: Diagnosis not present

## 2018-06-11 DIAGNOSIS — I700 Atherosclerosis of aorta: Secondary | ICD-10-CM | POA: Diagnosis not present

## 2018-06-11 DIAGNOSIS — N183 Chronic kidney disease, stage 3 (moderate): Secondary | ICD-10-CM | POA: Diagnosis not present

## 2018-06-11 DIAGNOSIS — M79673 Pain in unspecified foot: Secondary | ICD-10-CM | POA: Diagnosis not present

## 2018-06-11 DIAGNOSIS — M810 Age-related osteoporosis without current pathological fracture: Secondary | ICD-10-CM | POA: Diagnosis not present

## 2018-06-11 DIAGNOSIS — M419 Scoliosis, unspecified: Secondary | ICD-10-CM | POA: Diagnosis not present

## 2018-06-11 DIAGNOSIS — K219 Gastro-esophageal reflux disease without esophagitis: Secondary | ICD-10-CM | POA: Diagnosis not present

## 2018-06-11 DIAGNOSIS — M79606 Pain in leg, unspecified: Secondary | ICD-10-CM | POA: Diagnosis not present

## 2018-06-11 DIAGNOSIS — E1122 Type 2 diabetes mellitus with diabetic chronic kidney disease: Secondary | ICD-10-CM | POA: Diagnosis not present

## 2018-06-11 DIAGNOSIS — E1149 Type 2 diabetes mellitus with other diabetic neurological complication: Secondary | ICD-10-CM | POA: Diagnosis not present

## 2018-06-11 DIAGNOSIS — G20 Parkinson's disease: Secondary | ICD-10-CM | POA: Diagnosis not present

## 2018-06-11 DIAGNOSIS — I6529 Occlusion and stenosis of unspecified carotid artery: Secondary | ICD-10-CM | POA: Diagnosis not present

## 2018-06-11 DIAGNOSIS — M5136 Other intervertebral disc degeneration, lumbar region: Secondary | ICD-10-CM | POA: Diagnosis not present

## 2018-06-11 DIAGNOSIS — Z7982 Long term (current) use of aspirin: Secondary | ICD-10-CM | POA: Diagnosis not present

## 2018-06-11 DIAGNOSIS — I129 Hypertensive chronic kidney disease with stage 1 through stage 4 chronic kidney disease, or unspecified chronic kidney disease: Secondary | ICD-10-CM | POA: Diagnosis not present

## 2018-06-11 DIAGNOSIS — J449 Chronic obstructive pulmonary disease, unspecified: Secondary | ICD-10-CM | POA: Diagnosis not present

## 2018-06-11 DIAGNOSIS — D631 Anemia in chronic kidney disease: Secondary | ICD-10-CM | POA: Diagnosis not present

## 2018-06-11 DIAGNOSIS — E1151 Type 2 diabetes mellitus with diabetic peripheral angiopathy without gangrene: Secondary | ICD-10-CM | POA: Diagnosis not present

## 2018-06-11 DIAGNOSIS — M48061 Spinal stenosis, lumbar region without neurogenic claudication: Secondary | ICD-10-CM | POA: Diagnosis not present

## 2018-06-11 DIAGNOSIS — M19071 Primary osteoarthritis, right ankle and foot: Secondary | ICD-10-CM | POA: Diagnosis not present

## 2018-06-13 DIAGNOSIS — K219 Gastro-esophageal reflux disease without esophagitis: Secondary | ICD-10-CM | POA: Diagnosis not present

## 2018-06-13 DIAGNOSIS — G8929 Other chronic pain: Secondary | ICD-10-CM | POA: Diagnosis not present

## 2018-06-13 DIAGNOSIS — M79606 Pain in leg, unspecified: Secondary | ICD-10-CM | POA: Diagnosis not present

## 2018-06-13 DIAGNOSIS — I700 Atherosclerosis of aorta: Secondary | ICD-10-CM | POA: Diagnosis not present

## 2018-06-13 DIAGNOSIS — G20 Parkinson's disease: Secondary | ICD-10-CM | POA: Diagnosis not present

## 2018-06-13 DIAGNOSIS — M810 Age-related osteoporosis without current pathological fracture: Secondary | ICD-10-CM | POA: Diagnosis not present

## 2018-06-13 DIAGNOSIS — M419 Scoliosis, unspecified: Secondary | ICD-10-CM | POA: Diagnosis not present

## 2018-06-13 DIAGNOSIS — M797 Fibromyalgia: Secondary | ICD-10-CM | POA: Diagnosis not present

## 2018-06-13 DIAGNOSIS — M5136 Other intervertebral disc degeneration, lumbar region: Secondary | ICD-10-CM | POA: Diagnosis not present

## 2018-06-13 DIAGNOSIS — I129 Hypertensive chronic kidney disease with stage 1 through stage 4 chronic kidney disease, or unspecified chronic kidney disease: Secondary | ICD-10-CM | POA: Diagnosis not present

## 2018-06-13 DIAGNOSIS — M19071 Primary osteoarthritis, right ankle and foot: Secondary | ICD-10-CM | POA: Diagnosis not present

## 2018-06-13 DIAGNOSIS — E1151 Type 2 diabetes mellitus with diabetic peripheral angiopathy without gangrene: Secondary | ICD-10-CM | POA: Diagnosis not present

## 2018-06-13 DIAGNOSIS — M79673 Pain in unspecified foot: Secondary | ICD-10-CM | POA: Diagnosis not present

## 2018-06-13 DIAGNOSIS — J449 Chronic obstructive pulmonary disease, unspecified: Secondary | ICD-10-CM | POA: Diagnosis not present

## 2018-06-13 DIAGNOSIS — N183 Chronic kidney disease, stage 3 (moderate): Secondary | ICD-10-CM | POA: Diagnosis not present

## 2018-06-13 DIAGNOSIS — I2510 Atherosclerotic heart disease of native coronary artery without angina pectoris: Secondary | ICD-10-CM | POA: Diagnosis not present

## 2018-06-13 DIAGNOSIS — E1122 Type 2 diabetes mellitus with diabetic chronic kidney disease: Secondary | ICD-10-CM | POA: Diagnosis not present

## 2018-06-13 DIAGNOSIS — D631 Anemia in chronic kidney disease: Secondary | ICD-10-CM | POA: Diagnosis not present

## 2018-06-13 DIAGNOSIS — Z7982 Long term (current) use of aspirin: Secondary | ICD-10-CM | POA: Diagnosis not present

## 2018-06-13 DIAGNOSIS — M48061 Spinal stenosis, lumbar region without neurogenic claudication: Secondary | ICD-10-CM | POA: Diagnosis not present

## 2018-06-13 DIAGNOSIS — I6529 Occlusion and stenosis of unspecified carotid artery: Secondary | ICD-10-CM | POA: Diagnosis not present

## 2018-06-13 DIAGNOSIS — E1149 Type 2 diabetes mellitus with other diabetic neurological complication: Secondary | ICD-10-CM | POA: Diagnosis not present

## 2018-06-25 DIAGNOSIS — M79606 Pain in leg, unspecified: Secondary | ICD-10-CM | POA: Diagnosis not present

## 2018-06-25 DIAGNOSIS — M5136 Other intervertebral disc degeneration, lumbar region: Secondary | ICD-10-CM | POA: Diagnosis not present

## 2018-06-25 DIAGNOSIS — I129 Hypertensive chronic kidney disease with stage 1 through stage 4 chronic kidney disease, or unspecified chronic kidney disease: Secondary | ICD-10-CM | POA: Diagnosis not present

## 2018-06-25 DIAGNOSIS — M19071 Primary osteoarthritis, right ankle and foot: Secondary | ICD-10-CM | POA: Diagnosis not present

## 2018-06-25 DIAGNOSIS — Z7982 Long term (current) use of aspirin: Secondary | ICD-10-CM | POA: Diagnosis not present

## 2018-06-25 DIAGNOSIS — E1149 Type 2 diabetes mellitus with other diabetic neurological complication: Secondary | ICD-10-CM | POA: Diagnosis not present

## 2018-06-25 DIAGNOSIS — I700 Atherosclerosis of aorta: Secondary | ICD-10-CM | POA: Diagnosis not present

## 2018-06-25 DIAGNOSIS — E1151 Type 2 diabetes mellitus with diabetic peripheral angiopathy without gangrene: Secondary | ICD-10-CM | POA: Diagnosis not present

## 2018-06-25 DIAGNOSIS — I6529 Occlusion and stenosis of unspecified carotid artery: Secondary | ICD-10-CM | POA: Diagnosis not present

## 2018-06-25 DIAGNOSIS — G20 Parkinson's disease: Secondary | ICD-10-CM | POA: Diagnosis not present

## 2018-06-25 DIAGNOSIS — I2510 Atherosclerotic heart disease of native coronary artery without angina pectoris: Secondary | ICD-10-CM | POA: Diagnosis not present

## 2018-06-25 DIAGNOSIS — M810 Age-related osteoporosis without current pathological fracture: Secondary | ICD-10-CM | POA: Diagnosis not present

## 2018-06-25 DIAGNOSIS — N183 Chronic kidney disease, stage 3 (moderate): Secondary | ICD-10-CM | POA: Diagnosis not present

## 2018-06-25 DIAGNOSIS — K219 Gastro-esophageal reflux disease without esophagitis: Secondary | ICD-10-CM | POA: Diagnosis not present

## 2018-06-25 DIAGNOSIS — M79673 Pain in unspecified foot: Secondary | ICD-10-CM | POA: Diagnosis not present

## 2018-06-25 DIAGNOSIS — E1122 Type 2 diabetes mellitus with diabetic chronic kidney disease: Secondary | ICD-10-CM | POA: Diagnosis not present

## 2018-06-25 DIAGNOSIS — M419 Scoliosis, unspecified: Secondary | ICD-10-CM | POA: Diagnosis not present

## 2018-06-25 DIAGNOSIS — M797 Fibromyalgia: Secondary | ICD-10-CM | POA: Diagnosis not present

## 2018-06-25 DIAGNOSIS — D631 Anemia in chronic kidney disease: Secondary | ICD-10-CM | POA: Diagnosis not present

## 2018-06-25 DIAGNOSIS — G8929 Other chronic pain: Secondary | ICD-10-CM | POA: Diagnosis not present

## 2018-06-25 DIAGNOSIS — J449 Chronic obstructive pulmonary disease, unspecified: Secondary | ICD-10-CM | POA: Diagnosis not present

## 2018-06-25 DIAGNOSIS — M48061 Spinal stenosis, lumbar region without neurogenic claudication: Secondary | ICD-10-CM | POA: Diagnosis not present

## 2018-07-07 DIAGNOSIS — Z79899 Other long term (current) drug therapy: Secondary | ICD-10-CM | POA: Diagnosis not present

## 2018-07-07 DIAGNOSIS — E7800 Pure hypercholesterolemia, unspecified: Secondary | ICD-10-CM | POA: Diagnosis not present

## 2018-07-07 DIAGNOSIS — M5416 Radiculopathy, lumbar region: Secondary | ICD-10-CM | POA: Diagnosis not present

## 2018-07-07 DIAGNOSIS — I10 Essential (primary) hypertension: Secondary | ICD-10-CM | POA: Diagnosis not present

## 2018-07-07 DIAGNOSIS — G8929 Other chronic pain: Secondary | ICD-10-CM | POA: Diagnosis not present

## 2018-08-01 ENCOUNTER — Encounter (HOSPITAL_COMMUNITY): Payer: Self-pay

## 2018-08-01 ENCOUNTER — Observation Stay (HOSPITAL_COMMUNITY)
Admission: EM | Admit: 2018-08-01 | Discharge: 2018-08-04 | Disposition: A | Discharge status: Home Health | Payer: Medicare Other | Attending: Internal Medicine | Admitting: Internal Medicine

## 2018-08-01 ENCOUNTER — Other Ambulatory Visit: Payer: Self-pay

## 2018-08-01 ENCOUNTER — Emergency Department (HOSPITAL_COMMUNITY): Payer: Medicare Other

## 2018-08-01 DIAGNOSIS — E538 Deficiency of other specified B group vitamins: Secondary | ICD-10-CM | POA: Insufficient documentation

## 2018-08-01 DIAGNOSIS — N183 Chronic kidney disease, stage 3 (moderate): Secondary | ICD-10-CM | POA: Diagnosis not present

## 2018-08-01 DIAGNOSIS — R197 Diarrhea, unspecified: Secondary | ICD-10-CM | POA: Diagnosis not present

## 2018-08-01 DIAGNOSIS — T40604A Poisoning by unspecified narcotics, undetermined, initial encounter: Secondary | ICD-10-CM

## 2018-08-01 DIAGNOSIS — I129 Hypertensive chronic kidney disease with stage 1 through stage 4 chronic kidney disease, or unspecified chronic kidney disease: Secondary | ICD-10-CM | POA: Insufficient documentation

## 2018-08-01 DIAGNOSIS — R400 Somnolence: Secondary | ICD-10-CM | POA: Diagnosis not present

## 2018-08-01 DIAGNOSIS — E1122 Type 2 diabetes mellitus with diabetic chronic kidney disease: Secondary | ICD-10-CM | POA: Diagnosis not present

## 2018-08-01 DIAGNOSIS — R911 Solitary pulmonary nodule: Secondary | ICD-10-CM | POA: Insufficient documentation

## 2018-08-01 DIAGNOSIS — I10 Essential (primary) hypertension: Secondary | ICD-10-CM | POA: Diagnosis not present

## 2018-08-01 DIAGNOSIS — Z8249 Family history of ischemic heart disease and other diseases of the circulatory system: Secondary | ICD-10-CM | POA: Diagnosis not present

## 2018-08-01 DIAGNOSIS — R531 Weakness: Secondary | ICD-10-CM | POA: Diagnosis not present

## 2018-08-01 DIAGNOSIS — R109 Unspecified abdominal pain: Secondary | ICD-10-CM | POA: Diagnosis not present

## 2018-08-01 DIAGNOSIS — E785 Hyperlipidemia, unspecified: Secondary | ICD-10-CM | POA: Insufficient documentation

## 2018-08-01 DIAGNOSIS — F321 Major depressive disorder, single episode, moderate: Secondary | ICD-10-CM | POA: Diagnosis not present

## 2018-08-01 DIAGNOSIS — F419 Anxiety disorder, unspecified: Secondary | ICD-10-CM | POA: Diagnosis not present

## 2018-08-01 DIAGNOSIS — M47813 Spondylosis without myelopathy or radiculopathy, cervicothoracic region: Secondary | ICD-10-CM | POA: Diagnosis not present

## 2018-08-01 DIAGNOSIS — E1149 Type 2 diabetes mellitus with other diabetic neurological complication: Secondary | ICD-10-CM | POA: Diagnosis not present

## 2018-08-01 DIAGNOSIS — Z903 Acquired absence of stomach [part of]: Secondary | ICD-10-CM | POA: Diagnosis not present

## 2018-08-01 DIAGNOSIS — Z743 Need for continuous supervision: Secondary | ICD-10-CM | POA: Diagnosis not present

## 2018-08-01 DIAGNOSIS — M47812 Spondylosis without myelopathy or radiculopathy, cervical region: Secondary | ICD-10-CM | POA: Diagnosis not present

## 2018-08-01 DIAGNOSIS — Z86718 Personal history of other venous thrombosis and embolism: Secondary | ICD-10-CM | POA: Insufficient documentation

## 2018-08-01 DIAGNOSIS — J449 Chronic obstructive pulmonary disease, unspecified: Secondary | ICD-10-CM | POA: Insufficient documentation

## 2018-08-01 DIAGNOSIS — M797 Fibromyalgia: Secondary | ICD-10-CM | POA: Diagnosis not present

## 2018-08-01 DIAGNOSIS — Z0389 Encounter for observation for other suspected diseases and conditions ruled out: Secondary | ICD-10-CM | POA: Diagnosis not present

## 2018-08-01 DIAGNOSIS — R404 Transient alteration of awareness: Secondary | ICD-10-CM | POA: Diagnosis not present

## 2018-08-01 DIAGNOSIS — Z7982 Long term (current) use of aspirin: Secondary | ICD-10-CM | POA: Insufficient documentation

## 2018-08-01 DIAGNOSIS — Z915 Personal history of self-harm: Secondary | ICD-10-CM | POA: Insufficient documentation

## 2018-08-01 DIAGNOSIS — W06XXXA Fall from bed, initial encounter: Secondary | ICD-10-CM | POA: Insufficient documentation

## 2018-08-01 DIAGNOSIS — F17210 Nicotine dependence, cigarettes, uncomplicated: Secondary | ICD-10-CM | POA: Diagnosis not present

## 2018-08-01 DIAGNOSIS — T424X4A Poisoning by benzodiazepines, undetermined, initial encounter: Secondary | ICD-10-CM | POA: Diagnosis present

## 2018-08-01 DIAGNOSIS — G9341 Metabolic encephalopathy: Secondary | ICD-10-CM | POA: Diagnosis not present

## 2018-08-01 DIAGNOSIS — E1151 Type 2 diabetes mellitus with diabetic peripheral angiopathy without gangrene: Secondary | ICD-10-CM | POA: Insufficient documentation

## 2018-08-01 DIAGNOSIS — G20 Parkinson's disease: Secondary | ICD-10-CM | POA: Insufficient documentation

## 2018-08-01 DIAGNOSIS — R5383 Other fatigue: Secondary | ICD-10-CM | POA: Diagnosis not present

## 2018-08-01 DIAGNOSIS — Z79899 Other long term (current) drug therapy: Secondary | ICD-10-CM | POA: Insufficient documentation

## 2018-08-01 DIAGNOSIS — I2510 Atherosclerotic heart disease of native coronary artery without angina pectoris: Secondary | ICD-10-CM | POA: Insufficient documentation

## 2018-08-01 DIAGNOSIS — I6789 Other cerebrovascular disease: Secondary | ICD-10-CM | POA: Diagnosis not present

## 2018-08-01 DIAGNOSIS — K219 Gastro-esophageal reflux disease without esophagitis: Secondary | ICD-10-CM | POA: Insufficient documentation

## 2018-08-01 DIAGNOSIS — M1990 Unspecified osteoarthritis, unspecified site: Secondary | ICD-10-CM | POA: Insufficient documentation

## 2018-08-01 DIAGNOSIS — Z8711 Personal history of peptic ulcer disease: Secondary | ICD-10-CM | POA: Insufficient documentation

## 2018-08-01 DIAGNOSIS — T402X4A Poisoning by other opioids, undetermined, initial encounter: Secondary | ICD-10-CM | POA: Diagnosis not present

## 2018-08-01 LAB — COMPREHENSIVE METABOLIC PANEL
ALT: 17 U/L (ref 0–44)
AST: 25 U/L (ref 15–41)
Albumin: 3.8 g/dL (ref 3.5–5.0)
Alkaline Phosphatase: 126 U/L (ref 38–126)
Anion gap: 10 (ref 5–15)
BUN: 18 mg/dL (ref 8–23)
CO2: 23 mmol/L (ref 22–32)
Calcium: 9.2 mg/dL (ref 8.9–10.3)
Chloride: 111 mmol/L (ref 98–111)
Creatinine, Ser: 1.09 mg/dL — ABNORMAL HIGH (ref 0.44–1.00)
GFR calc Af Amer: 57 mL/min — ABNORMAL LOW (ref >60)
GFR calc non Af Amer: 49 mL/min — ABNORMAL LOW (ref >60)
Glucose, Bld: 111 mg/dL — ABNORMAL HIGH (ref 70–99)
Potassium: 3.2 mmol/L — ABNORMAL LOW (ref 3.5–5.1)
Sodium: 144 mmol/L (ref 135–145)
Total Bilirubin: 0.7 mg/dL (ref 0.3–1.2)
Total Protein: 6.8 g/dL (ref 6.5–8.1)

## 2018-08-01 LAB — CBC WITH DIFFERENTIAL/PLATELET
Abs Immature Granulocytes: 0.1 10*3/uL (ref 0.0–0.1)
Basophils Absolute: 0.1 10*3/uL (ref 0.0–0.1)
Basophils Relative: 0 %
Eosinophils Absolute: 0 10*3/uL (ref 0.0–0.7)
Eosinophils Relative: 0 %
HCT: 39.9 % (ref 36.0–46.0)
Hemoglobin: 12.1 g/dL (ref 12.0–15.0)
Immature Granulocytes: 0 %
Lymphocytes Relative: 4 %
Lymphs Abs: 0.4 10*3/uL — ABNORMAL LOW (ref 0.7–4.0)
MCH: 30.4 pg (ref 26.0–34.0)
MCHC: 30.3 g/dL (ref 30.0–36.0)
MCV: 100.3 fL — ABNORMAL HIGH (ref 78.0–100.0)
Monocytes Absolute: 0.6 10*3/uL (ref 0.1–1.0)
Monocytes Relative: 6 %
Neutro Abs: 10 10*3/uL — ABNORMAL HIGH (ref 1.7–7.7)
Neutrophils Relative %: 90 %
Platelets: 284 10*3/uL (ref 150–400)
RBC: 3.98 MIL/uL (ref 3.87–5.11)
RDW: 15.9 % — ABNORMAL HIGH (ref 11.5–15.5)
WBC: 11.2 10*3/uL — ABNORMAL HIGH (ref 4.0–10.5)

## 2018-08-01 LAB — I-STAT VENOUS BLOOD GAS, ED
Acid-base deficit: 2 mmol/L (ref 0.0–2.0)
Bicarbonate: 23.9 mmol/L (ref 20.0–28.0)
O2 Saturation: 48 %
TCO2: 25 mmol/L (ref 22–32)
pCO2, Ven: 46.4 mmHg (ref 44.0–60.0)
pH, Ven: 7.321 (ref 7.250–7.430)
pO2, Ven: 28 mmHg — CL (ref 32.0–45.0)

## 2018-08-01 LAB — I-STAT CHEM 8, ED
BUN: 21 mg/dL (ref 8–23)
Calcium, Ion: 1.2 mmol/L (ref 1.15–1.40)
Chloride: 112 mmol/L — ABNORMAL HIGH (ref 98–111)
Creatinine, Ser: 1 mg/dL (ref 0.44–1.00)
Glucose, Bld: 113 mg/dL — ABNORMAL HIGH (ref 70–99)
HCT: 39 % (ref 36.0–46.0)
Hemoglobin: 13.3 g/dL (ref 12.0–15.0)
Potassium: 3.2 mmol/L — ABNORMAL LOW (ref 3.5–5.1)
Sodium: 145 mmol/L (ref 135–145)
TCO2: 23 mmol/L (ref 22–32)

## 2018-08-01 LAB — ETHANOL: Alcohol, Ethyl (B): <10 mg/dL (ref <10)

## 2018-08-01 LAB — I-STAT CG4 LACTIC ACID, ED: Lactic Acid, Venous: 1.92 mmol/L — ABNORMAL HIGH (ref 0.5–1.9)

## 2018-08-01 LAB — AMMONIA: Ammonia: 18 umol/L (ref 9–35)

## 2018-08-01 LAB — ACETAMINOPHEN LEVEL: Acetaminophen (Tylenol), Serum: <10 ug/mL — ABNORMAL LOW (ref 10–30)

## 2018-08-01 LAB — SALICYLATE LEVEL: Salicylate Lvl: <7 mg/dL (ref 2.8–30.0)

## 2018-08-01 MED ORDER — NALOXONE HCL 0.4 MG/ML IJ SOLN: 0.4000 mg | Freq: Once | INTRAMUSCULAR | Status: DC

## 2018-08-01 MED ORDER — NALOXONE HCL 0.4 MG/ML IJ SOLN
0.4000 mg | Freq: Once | INTRAMUSCULAR | Status: AC
  Administered 2018-08-01: 0.4 mg via INTRAVENOUS
  Filled 2018-08-01: qty 1

## 2018-08-01 NOTE — ED Provider Notes (Signed)
Level 5 caveat altered mental status.  Patient had possible overdose Klonopin.  On exam she arouses to noxious stimulus with purposeful movement and answers appropriately she is very somnolent.  Gag reflex is intact.  She moves all extremities.   Orlie Dakin, MD 08/02/18 [phone]

## 2018-08-01 NOTE — ED Notes (Signed)
Taken back to [facility]

## 2018-08-01 NOTE — ED Notes (Signed)
Admitting doctor at  The bedside 

## 2018-08-01 NOTE — ED Notes (Signed)
Pur-wick placed  Pt was incontinent of urine on arrival  Cough and responds to painful stimulus

## 2018-08-01 NOTE — ED Notes (Signed)
Pt returned from 2nd trip to xray bp remains high

## 2018-08-01 NOTE — ED Notes (Signed)
Pt sleeping  She responds to vigorous stimuli painful only  Moans and answers yes and no questions  ?? accuracy

## 2018-08-01 NOTE — ED Triage Notes (Signed)
Per [facility], pt from home for possible overdose. Pt admits to taking 2 clonopin and only supposed to take 0.5 tablet. Pt is lethargic, has swelling to both eye lids. Pt has hx of overdose. Pt was found by her friend laying under her bed asleep. LSN yesterday at 1700. VS 184/100, HR 90 NSR, 24 RR, 131 cbg, 99% on RA.

## 2018-08-01 NOTE — ED Provider Notes (Signed)
[facility] EMERGENCY DEPARTMENT Provider Note   CSN: [id number] Arrival date & time: 08/01/18  1811     History   Chief Complaint Chief Complaint  Patient presents with  . Drug Overdose    HPI [name] is a 72 y.o. female.  HPI  Patient is a 73 year old female with PMHx of HTN, CAD, Parkinson's disease, COPD, OSA, and GERD who presents with questionable overdose of clonidine.  EMS reports patient taken to Klonopin as opposed to instructed 0.5 tablet.  She was found in her bedroom on the floor by a friend asleep.  Last known normal at 1700 yesterday.  POC glucose 131.  VS WNL.  C-collar in place.  On arrival patient somnolent however protecting her airway.  She is responsive to painful stimuli only.  Per chart review patient with prior admissions for salicylate and opiate overdoses.    Level 5 caveat 2/2 patient's altered mental status.  History limited.  Past Medical History:  Diagnosis Date  . Anxiety and depression   . Arthritis   . Asthma   . Carotid artery occlusion   . Chronic kidney disease   . Chronic pain    leg and feet  . COPD (chronic obstructive pulmonary disease) ([place])   . Coronary artery disease   . DDD (degenerative disc disease)   . Depression   . Diarrhea    chronic   . DVT (deep venous thrombosis) ([place])   . Fibromyalgia   . GERD (gastroesophageal reflux disease)   . Grave's disease   . Headache   . Hypertension   . OP (osteoporosis)   . Parkinson's disease ([place])   . Peripheral vascular disease ([place])   . PUD (peptic ulcer disease)   . Recurrent upper respiratory infection (URI)   . Rhinitis   . Sleep apnea   . Spinal stenosis of lumbar region 04/23/2013  . Thrombophlebitis   . Tobacco use disorder 04/23/2013  . Vitamin B 12 deficiency     Patient Active Problem List   Diagnosis Date Noted  . Pulmonary nodule, left 08/02/2018  . Benzodiazepine overdose of undetermined intent 08/01/2018  . Cellulitis 05/14/2018    . Anemia 05/14/2018  . At risk for adverse drug event 10/17/2017  . Salicylate intoxication, undetermined intent, subsequent encounter 10/10/2017  . Acute renal failure superimposed on chronic kidney disease ([place]) 10/10/2017  . Headache 10/10/2017  . Protein-calorie malnutrition, severe 01/26/2016  . Opiate overdose ([place]) 01/25/2016  . Suicide attempt ([place]) 01/25/2016  . Depression   . Dyslipidemia   . Gastroesophageal reflux disease without esophagitis   . COPD (chronic obstructive pulmonary disease) ([place]) 07/29/2015  . Diabetes mellitus with neurological manifestation ([place]) 07/29/2015  . Frequent falls 07/29/2015  . HTN (hypertension) 07/29/2015  . AKI (acute kidney injury) ([place]) 07/29/2015  . SIRS (systemic inflammatory response syndrome) ([place]) 07/29/2015  . Spinal stenosis of lumbar region 04/23/2013  . Tremor due to multiple drugs 04/23/2013  . Tobacco use disorder 04/23/2013  . COPD exacerbation ([place]) 04/23/2013  . Occlusion and stenosis of carotid artery without mention of cerebral infarction 03/12/2012  . Viral bronchitis-possible h. infl vs Norovirus 11/21/2011  . Pleuritic chest pain 09/18/2011    Past Surgical History:  Procedure Laterality Date  . CAROTID ENDARTERECTOMY Left Sept. 20,2011   cea  . CHOLECYSTECTOMY  1999  . GASTRECTOMY     age 73   Part of small intestin and part of stomach  . LEFT HEART CATHETERIZATION WITH CORONARY ANGIOGRAM N/A  10/02/2011   Procedure: LEFT HEART CATHETERIZATION WITH CORONARY ANGIOGRAM;  Surgeon: Sueanne Margarita, MD;  Location: [facility] CATH LAB;  Service: Cardiovascular;  Laterality: N/A;     OB History   None      Home Medications    Prior to Admission medications   Medication Sig Start Date End Date Taking? Authorizing Provider  amLODipine (NORVASC) 10 MG tablet Take 1 tablet (10 mg total) by mouth daily. 01/28/16   Thurnell Lose, MD  aspirin 81 MG tablet Take 81 mg by mouth daily.      [provider]   atorvastatin (LIPITOR) 20 MG tablet Take 20 mg by mouth at bedtime. 03/25/18   [provider]  calcium acetate (PHOSLO) 667 MG capsule Take 667 mg by mouth 2 (two) times daily.     [provider]  clonazePAM (KLONOPIN) 1 MG tablet Take 1 mg by mouth 2 (two) times daily as needed for anxiety (anxiety). For anxiety 10/16/17 09/29/18  [provider]  DULoxetine HCl 40 MG CPEP Take 40 mg by mouth daily. 10/16/17   Nita Sells, MD  metoprolol succinate (TOPROL XL) 25 MG 24 hr tablet Take 0.5 tablets (12.5 mg total) by mouth daily. 10/16/17 10/16/18  Nita Sells, MD  nicotine (NICODERM CQ - DOSED IN MG/24 HOURS) 14 mg/24hr patch Place 1 patch (14 mg total) onto the skin daily. Patient not taking: Reported on 05/14/2018 02/02/16   Elgergawy, Silver Huguenin, MD  nitroGLYCERIN (NITROSTAT) 0.4 MG SL tablet Place 0.4 mg under the tongue every 5 (five) minutes as needed. For chest pain     [provider]  ondansetron (ZOFRAN) 8 MG tablet Take 0.5 tablets (4 mg total) by mouth every 8 (eight) hours as needed for nausea or vomiting. 02/02/16   Elgergawy, Silver Huguenin, MD  oxyCODONE-acetaminophen (PERCOCET) 10-325 MG tablet TAKE 1 TABLET BY MOUTH 4 TIMES DAILY AS NEEDED 05/06/18   [provider]  pantoprazole (PROTONIX) 40 MG tablet Take 40 mg by mouth 2 (two) times daily. Filled 08-23-17    [provider]  Prenatal Vit-Fe Fumarate-FA (PRENATAL PO) Take 1 tablet by mouth daily.    [provider]  promethazine (PHENERGAN) 25 MG tablet Take 25 mg by mouth every 6 (six) hours as needed. 03/06/18   [provider]    Family History Family History  Problem Relation Age of Onset  . Diabetes Mother   . Hyperlipidemia Mother   . Heart attack Mother   . Other Mother        varicose veins,respiratory,stroke  . Heart disease Mother        before age 50  . Hypertension Mother   . Varicose Veins Mother   . Diabetes Father   . Heart disease  Father        before age 29  . Hyperlipidemia Father   . Heart attack Father   . Other Father        varicose veins  . Hypertension Father   . Varicose Veins Father   . Diabetes Sister   . Heart disease Sister        before age 53  . Hyperlipidemia Sister   . Heart attack Sister   . Other Sister        varicose veins  . Hypertension Sister   . Varicose Veins Sister   . Peripheral vascular disease Sister   . Diabetes Sister   . Hyperlipidemia Sister   . Hypertension Sister   .  Varicose Veins Sister     Social History Social History   Tobacco Use  . Smoking status: Current Every Day Smoker    Packs/day: 2.00    Years: 50.00    Pack years: 100.00    Types: Cigarettes  . Smokeless tobacco: Never Used  . Tobacco comment: cessation info given and reviewed  Substance Use Topics  . Alcohol use: No    Alcohol/week: 0.0 standard drinks  . Drug use: No     Allergies   Patient has no known allergies.   Review of Systems Review of Systems  Unable to perform ROS: Mental status change     Physical Exam Updated Vital Signs BP (!) 176/84   Pulse 88   Temp 98.1 F (36.7 C) (Oral)   Resp (!) 22   Ht 5\' 4"  (1.626 m)   Wt 46.7 kg   SpO2 100%   BMI 17.67 kg/m   Physical Exam  Constitutional: She appears well-developed and well-nourished. No distress.  HENT:  Head: Normocephalic and atraumatic.  Mouth/Throat: Oropharynx is clear and moist.  Eyes: Pupils are equal, round, and reactive to light. EOM are normal.  Chemosis and edema noted to bilateral eyes.  Neck: Normal range of motion. Neck supple.  C-collar in place.  Cardiovascular: Regular rhythm. Tachycardia present.  No murmur heard. Pulmonary/Chest: Effort normal and breath sounds normal. No respiratory distress. She has no wheezes. She has no rales.  Bilateral breath sounds present.  Abdominal: Soft. She exhibits no distension. There is no tenderness. There is no guarding.  Musculoskeletal: She exhibits no  edema.  Neurological: She is unresponsive. GCS eye subscore is 2. GCS verbal subscore is 3. GCS motor subscore is 4.  Skin: Skin is warm and dry. Capillary refill takes less than 2 seconds. No rash noted.  Nursing note and vitals reviewed.    ED Treatments / Results  Labs (all labs ordered are listed, but only abnormal results are displayed) Labs Reviewed  COMPREHENSIVE METABOLIC PANEL - Abnormal; Notable for the following components:      Result Value   Potassium 3.2 (*)    Glucose, Bld 111 (*)    Creatinine, Ser 1.09 (*)    GFR calc non Af Amer 49 (*)    GFR calc Af Amer 57 (*)    All other components within normal limits  CBC WITH DIFFERENTIAL/PLATELET - Abnormal; Notable for the following components:   WBC 11.2 (*)    MCV 100.3 (*)    RDW 15.9 (*)    Neutro Abs 10.0 (*)    Lymphs Abs 0.4 (*)    All other components within normal limits  RAPID URINE DRUG SCREEN, HOSP PERFORMED - Abnormal; Notable for the following components:   Benzodiazepines POSITIVE (*)    All other components within normal limits  URINALYSIS, ROUTINE W REFLEX MICROSCOPIC - Abnormal; Notable for the following components:   Color, Urine STRAW (*)    Ketones, ur 5 (*)    All other components within normal limits  ACETAMINOPHEN LEVEL - Abnormal; Notable for the following components:   Acetaminophen (Tylenol), Serum <10 (*)    All other components within normal limits  I-STAT CHEM 8, ED - Abnormal; Notable for the following components:   Potassium 3.2 (*)    Chloride 112 (*)    Glucose, Bld 113 (*)    All other components within normal limits  I-STAT CG4 LACTIC ACID, ED - Abnormal; Notable for the following components:   Lactic Acid, Venous  1.92 (*)    All other components within normal limits  I-STAT VENOUS BLOOD GAS, ED - Abnormal; Notable for the following components:   pO2, Ven 28.0 (*)    All other components within normal limits  CULTURE, BLOOD (ROUTINE X 2)  CULTURE, BLOOD (ROUTINE X 2)   URINE CULTURE  MRSA PCR SCREENING  AMMONIA  ETHANOL  SALICYLATE LEVEL  BLOOD GAS, VENOUS  BASIC METABOLIC PANEL  CBC  CBG MONITORING, ED    EKG EKG Interpretation  Date/Time:  Friday August 01 2018 18:16:20 EDT Ventricular Rate:  83 PR Interval:    QRS Duration: 88 QT Interval:  394 QTC Calculation: 463 R Axis:   -27 Text Interpretation:  Sinus rhythm Consider left ventricular hypertrophy No significant change since last tracing Confirmed by Orlie Dakin [phone]) on 08/01/2018 6:35:27 PM   Radiology Dg Chest 2 View  Result Date: 08/01/2018 CLINICAL DATA:  Possible overdose. EXAM: CHEST - 2 VIEW COMPARISON:  05/14/2018 FINDINGS: The lungs are clear without focal pneumonia, edema, pneumothorax or pleural effusion. Interstitial markings are diffusely coarsened with chronic features. Basilar atelectasis or scarring noted. The cardiopericardial silhouette is within normal limits for size. Bones are diffusely demineralized. Telemetry leads overlie the chest. IMPRESSION: Basilar atelectasis or scarring. Otherwise no acute cardiopulmonary findings. Electronically Signed   By: Misty Stanley M.D.   On: 08/01/2018 19:36   Ct Head Wo Contrast  Result Date: 08/01/2018 CLINICAL DATA:  Possible overdose, lethargic EXAM: CT HEAD WITHOUT CONTRAST CT CERVICAL SPINE WITHOUT CONTRAST TECHNIQUE: Multidetector CT imaging of the head and cervical spine was performed following the standard protocol without intravenous contrast. Multiplanar CT image reconstructions of the cervical spine were also generated. COMPARISON:  MRI 10/12/2017, CT brain 10/09/2017 FINDINGS: CT HEAD FINDINGS Brain: No acute territorial infarction, hemorrhage or intracranial mass. Mild atrophy. Mild small vessel ischemic changes of the white matter. Stable ventricle size. Vascular: No hyperdense vessels.  Carotid vascular calcification Skull: Normal. Negative for fracture or focal lesion. Sinuses/Orbits: Mucosal thickening in the  right maxillary and ethmoid sinuses Other: Mild periorbital soft tissue swelling. CT CERVICAL SPINE FINDINGS Alignment: No subluxation.  Facet alignment within normal limits Skull base and vertebrae: No acute fracture. No primary bone lesion or focal pathologic process. Fluid in the left mastoid. Soft tissues and spinal canal: No prevertebral fluid or swelling. No visible canal hematoma. Disc levels: Mild to moderate degenerative changes C4-C5, C5-C6, C6-C7 and C7-T1. Multiple level bilateral facet degenerative change. Upper chest: Lung apices demonstrate an 7 mm left upper lobe pulmonary nodule. There are postsurgical changes of the left carotid artery. Other: None IMPRESSION: 1. No CT evidence for acute intracranial abnormality. Atrophy and small vessel ischemic changes of the white matter 2. Degenerative changes of the cervical spine without acute osseous abnormality 3. Left mastoid effusion 4. 7 mm left apical pulmonary nodule. Dedicated chest CT may be performed for more thorough evaluation of the lung parenchyma, this could be performed on a nonemergent basis. Electronically Signed   By: Donavan Foil M.D.   On: 08/01/2018 20:48   Ct Cervical Spine Wo Contrast  Result Date: 08/01/2018 CLINICAL DATA:  Possible overdose, lethargic EXAM: CT HEAD WITHOUT CONTRAST CT CERVICAL SPINE WITHOUT CONTRAST TECHNIQUE: Multidetector CT imaging of the head and cervical spine was performed following the standard protocol without intravenous contrast. Multiplanar CT image reconstructions of the cervical spine were also generated. COMPARISON:  MRI 10/12/2017, CT brain 10/09/2017 FINDINGS: CT HEAD FINDINGS Brain: No acute territorial infarction, hemorrhage or intracranial  mass. Mild atrophy. Mild small vessel ischemic changes of the white matter. Stable ventricle size. Vascular: No hyperdense vessels.  Carotid vascular calcification Skull: Normal. Negative for fracture or focal lesion. Sinuses/Orbits: Mucosal thickening in  the right maxillary and ethmoid sinuses Other: Mild periorbital soft tissue swelling. CT CERVICAL SPINE FINDINGS Alignment: No subluxation.  Facet alignment within normal limits Skull base and vertebrae: No acute fracture. No primary bone lesion or focal pathologic process. Fluid in the left mastoid. Soft tissues and spinal canal: No prevertebral fluid or swelling. No visible canal hematoma. Disc levels: Mild to moderate degenerative changes C4-C5, C5-C6, C6-C7 and C7-T1. Multiple level bilateral facet degenerative change. Upper chest: Lung apices demonstrate an 7 mm left upper lobe pulmonary nodule. There are postsurgical changes of the left carotid artery. Other: None IMPRESSION: 1. No CT evidence for acute intracranial abnormality. Atrophy and small vessel ischemic changes of the white matter 2. Degenerative changes of the cervical spine without acute osseous abnormality 3. Left mastoid effusion 4. 7 mm left apical pulmonary nodule. Dedicated chest CT may be performed for more thorough evaluation of the lung parenchyma, this could be performed on a nonemergent basis. Electronically Signed   By: Donavan Foil M.D.   On: 08/01/2018 20:48    Procedures Procedures (including critical care time)  Medications Ordered in ED Medications  enoxaparin (LOVENOX) injection 40 mg (has no administration in time range)  sodium chloride flush (NS) 0.9 % injection 3 mL (has no administration in time range)  acetaminophen (TYLENOL) tablet 650 mg (has no administration in time range)    Or  acetaminophen (TYLENOL) suppository 650 mg (has no administration in time range)  hydrALAZINE (APRESOLINE) injection 10 mg (has no administration in time range)  0.9 % NaCl with KCl 40 mEq / L  infusion (has no administration in time range)  naloxone [facility]) injection 0.4 mg (0.4 mg Intravenous Given 08/01/18 2258)     Initial Impression / Assessment and Plan / ED Course  I have reviewed the triage vital signs and the nursing  notes.  Pertinent labs & imaging results that were available during my care of the patient were reviewed by me and considered in my medical decision making (see chart for details).    Patient is a 72 year old female with PMHx of HTN, CAD, Parkinson's disease, COPD, OSA, and GERD who presents with questionable overdose of clonidine.  Per chart review patient with prior admissions for salicylate and opiate overdoses.  She was last admitted on 04/2018 for cellulitis.  Attempted to contact family however no response on the phone number listed in chart.    On arrival patient is somnolent and only withdrawing to pain however is protecting her airway not requiring intubation at this time.  C-collar is in place.  She appears encephalopathic therefore AMS work-up pursued.  No obvious trauma on exam.  She is hypertensive to 180/77 and slightly tachycardic to 100s otherwise afebrile satting 100% on RA.  POC glucose 90.  Acetaminophen, salicylate, and EtOH levels undetectable.  VBG grossly unremarkable.  CBC and CMP grossly unremarkable significant only for slight hypokalemia of 3.2.  CT head without acute intracranial abnormality.  CT C-spine without acute fracture or malalignment.  CXR without acute cardiopulmonary process.  No obvious pneumonia or PTX.  UA and UDS pending at this time, will attempt in and out.  Reports of clonidine overdose do not match presentation.  Will attempt Narcan and observe response.  EKG shows NSR with a rate of 83 and  no evidence of acute ischemic changes, abnormal intervals, or dysrhythmia.  Mild LVH.  2220 - Narcan given with delayed but reassuring response.  Etiology of encephalopathy likely 2/2 due to opioid overdose.  Questionable polypharmacy on board as well.  UA and UDS pending at this time.  Discussed case with hospitalist who admit for observation.  She is continuing to protect her airway.  End-tidal CO2 monitoring in place.  2340 - Called to bedside due to patient fall  out of bed.  She was found on the floor stating she needed to void.  Prior to this patient was given Narcan.  No obvious injury on exam.  She is moving all her extremities spontaneously.  She is able to answer questions however confused which is her previous baseline.  Fall precautions placed.  Awaiting admission at this time.  Final Clinical Impressions(s) / ED Diagnoses   Final diagnoses:  Opiate overdose, undetermined intent, initial encounter [facility])  Somnolence    ED Discharge Orders    None       Fabian November, MD 08/02/18 [place], [place], MD 08/04/18 1142

## 2018-08-01 NOTE — ED Notes (Addendum)
The pt just had an in and out cath  Both side rails had been up entirely  Another pt had a seizure I went to that pt and the pt was found in the floor a few minutes later  She reported that she was going to the br she needed to void   Apparently the narcan kicked in a few minutes later after it was given  The pt is now agruing that she needs to go to the br,  No obvious injury unable to bear weight shen we liftged her up   The nurse tech found the pt in the floor beside the bed and the lt side rail was down  ujnsure how that occurfred

## 2018-08-01 NOTE — H&P (Signed)
History and Physical    [name] WJX:[id number] DOB: [birth year] DOA: 08/01/2018  PCP: [name], MD  Patient coming from: Home  I have personally briefly reviewed patient's old medical records in [place]  Chief Complaint: Altered mental status  HPI: [name] is a 72 y.o. female with medical history significant for COPD, CAD, CKD, history of intentional overdose who was brought by EMS for altered mental status.  Per ED provider, EMS noted Klonopin as potential overdose medication.  History is provided by chart review and ED provider as patient is very somnolent and inconsistently interactive.   Initially patient was unresponsive to deep painful stimuli.  She was given Narcan with some improved response.  She is protecting her airway with intact gag reflex and moving all extremities.  Just prior to my examination she reportedly fell out of bed she was apparently trying to go to the bathroom.  She is placed in a neck collar but did not have an obvious injury.  During my exam patient would answer yes/no questions.  She complained of abdominal pain and some diarrhea.  She denied taking extra medications at home.  She denied chest pain, dyspnea, fevers, dysuria.  ED Course:  Initial vitals BP 199/98, pulse 86, RR 28, temp 97.6 F, SPO2 99% RA. Labs: Ammonia 18 CMP notable for K 3.2, BUN 18, Cr 1.09, glucose 111, LFTS WNL CBC - WBC 11.2, hgb 12.1, Plt 284 Lactic acid 1.92 Ethanol <10 Acetaminophen level <21 Salicylate level <3.0 VBG 7.32/46.4/28 UDS + Benzos UA unremarkable  CXR without acute pulmonary disease.  CT head and cervical spine were without acute intracranial abnormalities.  Incidental 7 mm left apical pulmonary nodule was seen.   Review of Systems: As per HPI otherwise 10 point review of systems negative.    Past Medical History:  Diagnosis Date  . Anxiety and depression   . Arthritis   . Asthma   . Carotid artery occlusion   . Chronic  kidney disease   . Chronic pain    leg and feet  . COPD (chronic obstructive pulmonary disease) ([place])   . Coronary artery disease   . DDD (degenerative disc disease)   . Depression   . Diarrhea    chronic   . DVT (deep venous thrombosis) ([place])   . Fibromyalgia   . GERD (gastroesophageal reflux disease)   . Grave's disease   . Headache   . Hypertension   . OP (osteoporosis)   . Parkinson's disease ([place])   . Peripheral vascular disease ([place])   . PUD (peptic ulcer disease)   . Recurrent upper respiratory infection (URI)   . Rhinitis   . Sleep apnea   . Spinal stenosis of lumbar region 04/23/2013  . Thrombophlebitis   . Tobacco use disorder 04/23/2013  . Vitamin B 12 deficiency     Past Surgical History:  Procedure Laterality Date  . CAROTID ENDARTERECTOMY Left Sept. 20,2011   cea  . CHOLECYSTECTOMY  1999  . GASTRECTOMY     age 83   Part of small intestin and part of stomach  . LEFT HEART CATHETERIZATION WITH CORONARY ANGIOGRAM N/A 10/02/2011   Procedure: LEFT HEART CATHETERIZATION WITH CORONARY ANGIOGRAM;  Surgeon: Sueanne Margarita, MD;  Location: [facility];  Service: Cardiovascular;  Laterality: N/A;     reports that she has been smoking cigarettes. She has a 100.00 pack-year smoking history. She has never used smokeless tobacco. She reports that she does not drink alcohol  or use drugs.  No Known Allergies  Family History  Problem Relation Age of Onset  . Diabetes Mother   . Hyperlipidemia Mother   . Heart attack Mother   . Other Mother        varicose veins,respiratory,stroke  . Heart disease Mother        before age 25  . Hypertension Mother   . Varicose Veins Mother   . Diabetes Father   . Heart disease Father        before age 50  . Hyperlipidemia Father   . Heart attack Father   . Other Father        varicose veins  . Hypertension Father   . Varicose Veins Father   . Diabetes Sister   . Heart disease Sister        before age 2  . Hyperlipidemia  Sister   . Heart attack Sister   . Other Sister        varicose veins  . Hypertension Sister   . Varicose Veins Sister   . Peripheral vascular disease Sister   . Diabetes Sister   . Hyperlipidemia Sister   . Hypertension Sister   . Varicose Veins Sister     Prior to Admission medications   Medication Sig Start Date End Date Taking? Authorizing Provider  amLODipine (NORVASC) 10 MG tablet Take 1 tablet (10 mg total) by mouth daily. 01/28/16   Thurnell Lose, MD  aspirin 81 MG tablet Take 81 mg by mouth daily.      [provider]  atorvastatin (LIPITOR) 20 MG tablet Take 20 mg by mouth at bedtime. 03/25/18   [provider]  calcium acetate (PHOSLO) 667 MG capsule Take 667 mg by mouth 2 (two) times daily.     [provider]  clonazePAM (KLONOPIN) 1 MG tablet Take 1 mg by mouth 2 (two) times daily as needed for anxiety (anxiety). For anxiety 10/16/17 09/29/18  [provider]  DULoxetine HCl 40 MG CPEP Take 40 mg by mouth daily. 10/16/17   Nita Sells, MD  metoprolol succinate (TOPROL XL) 25 MG 24 hr tablet Take 0.5 tablets (12.5 mg total) by mouth daily. 10/16/17 10/16/18  Nita Sells, MD  nicotine (NICODERM CQ - DOSED IN MG/24 HOURS) 14 mg/24hr patch Place 1 patch (14 mg total) onto the skin daily. Patient not taking: Reported on 05/14/2018 02/02/16   Elgergawy, Silver Huguenin, MD  nitroGLYCERIN (NITROSTAT) 0.4 MG SL tablet Place 0.4 mg under the tongue every 5 (five) minutes as needed. For chest pain     [provider]  ondansetron (ZOFRAN) 8 MG tablet Take 0.5 tablets (4 mg total) by mouth every 8 (eight) hours as needed for nausea or vomiting. 02/02/16   Elgergawy, Silver Huguenin, MD  oxyCODONE-acetaminophen (PERCOCET) 10-325 MG tablet TAKE 1 TABLET BY MOUTH 4 TIMES DAILY AS NEEDED 05/06/18   [provider]  pantoprazole (PROTONIX) 40 MG tablet Take 40 mg by mouth 2 (two) times daily. Filled 08-23-17    [provider]    Prenatal Vit-Fe Fumarate-FA (PRENATAL PO) Take 1 tablet by mouth daily.    [provider]  promethazine (PHENERGAN) 25 MG tablet Take 25 mg by mouth every 6 (six) hours as needed. 03/06/18   [provider]    Physical Exam: Vitals:   08/01/18 2300 08/01/18 2315 08/02/18 0051 08/02/18 0124  BP: (!) 170/80 (!) 180/77 (!) 202/89 (!) 176/84  Pulse: 99 (!) 107 88   Resp: [name])  40 (!) 37 (!) 22   Temp:   98.1 F (36.7 C)   TempSrc:   Oral   SpO2: 96% 97% 100%   Weight:   46.7 kg   Height:   5\' 4"  (1.626 m)    Physical exam limited due to patient cooperation Constitutional: somnolent, answering yes/no questions, keeps eyes closed Vitals:   08/01/18 2300 08/01/18 2315 08/02/18 0051 08/02/18 0124  BP: (!) 170/80 (!) 180/77 (!) 202/89 (!) 176/84  Pulse: 99 (!) 107 88   Resp: (!) 40 (!) 37 (!) 22   Temp:   98.1 F (36.7 C)   TempSrc:   Oral   SpO2: 96% 97% 100%   Weight:   46.7 kg   Height:   5\' 4"  (1.626 m)    Eyes: PERRL, swollen eyelids bilaterally ENMT: Mucous membranes are dry.  Neck: normal, supple, no masses, no thyromegaly Respiratory: clear to auscultation bilaterally, no wheezing, no crackles. Normal respiratory effort. No accessory muscle use.  Cardiovascular: Regular rate and rhythm, no murmurs / rubs / gallops. No extremity edema.  Abdomen: Slight tenderness right lower quadrant, no masses palpated. No hepatosplenomegaly. Bowel sounds positive.  Musculoskeletal: no clubbing / cyanosis. No joint deformity upper and lower extremities. Good ROM, no contractures. Normal muscle tone.  Skin: no rashes, lesions, ulcers. No induration Neurologic: Somnolent but answering yes/no questions, moving all extremities spontaneously Psychiatric: Intermittently alert   Labs on Admission: I have personally reviewed following labs and imaging studies  CBC: Recent Labs  Lab 08/01/18 1844 08/01/18 1914  WBC 11.2*  --   NEUTROABS 10.0*  --   HGB 12.1 13.3  HCT 39.9  39.0  MCV 100.3*  --   PLT 284  --    Basic Metabolic Panel: Recent Labs  Lab 08/01/18 1844 08/01/18 1914  NA 144 145  K 3.2* 3.2*  CL 111 112*  CO2 23  --   GLUCOSE 111* 113*  BUN 18 21  CREATININE 1.09* 1.00  CALCIUM 9.2  --    GFR: Estimated Creatinine Clearance: 36.9 mL/min (by C-G formula based on SCr of 1 mg/dL). Liver Function Tests: Recent Labs  Lab 08/01/18 1844  AST 25  ALT 17  ALKPHOS 126  BILITOT 0.7  PROT 6.8  ALBUMIN 3.8   No results for input(s): LIPASE, AMYLASE in the last 168 hours. Recent Labs  Lab 08/01/18 1826  AMMONIA 18   Coagulation Profile: No results for input(s): INR, PROTIME in the last 168 hours. Cardiac Enzymes: No results for input(s): CKTOTAL, CKMB, CKMBINDEX, TROPONINI in the last 168 hours. BNP (last 3 results) No results for input(s): PROBNP in the last 8760 hours. HbA1C: No results for input(s): HGBA1C in the last 72 hours. CBG: No results for input(s): GLUCAP in the last 168 hours. Lipid Profile: No results for input(s): CHOL, HDL, LDLCALC, TRIG, CHOLHDL, LDLDIRECT in the last 72 hours. Thyroid Function Tests: No results for input(s): TSH, T4TOTAL, FREET4, T3FREE, THYROIDAB in the last 72 hours. Anemia Panel: No results for input(s): VITAMINB12, FOLATE, FERRITIN, TIBC, IRON, RETICCTPCT in the last 72 hours. Urine analysis:    Component Value Date/Time   COLORURINE STRAW (A) 08/01/2018 2322   APPEARANCEUR CLEAR 08/01/2018 2322   LABSPEC 1.010 08/01/2018 2322   PHURINE 5.0 08/01/2018 2322   GLUCOSEU NEGATIVE 08/01/2018 2322   HGBUR NEGATIVE 08/01/2018 2322   BILIRUBINUR NEGATIVE 08/01/2018 2322   KETONESUR 5 (A) 08/01/2018 2322   PROTEINUR NEGATIVE 08/01/2018 2322   UROBILINOGEN 0.2 07/28/2015 2223  NITRITE NEGATIVE 08/01/2018 2322   LEUKOCYTESUR NEGATIVE 08/01/2018 2322    Radiological Exams on Admission: Dg Chest 2 View  Result Date: 08/01/2018 CLINICAL DATA:  Possible overdose. EXAM: CHEST - 2 VIEW  COMPARISON:  05/14/2018 FINDINGS: The lungs are clear without focal pneumonia, edema, pneumothorax or pleural effusion. Interstitial markings are diffusely coarsened with chronic features. Basilar atelectasis or scarring noted. The cardiopericardial silhouette is within normal limits for size. Bones are diffusely demineralized. Telemetry leads overlie the chest. IMPRESSION: Basilar atelectasis or scarring. Otherwise no acute cardiopulmonary findings. Electronically Signed   By: Misty Stanley M.D.   On: 08/01/2018 19:36   Ct Head Wo Contrast  Result Date: 08/01/2018 CLINICAL DATA:  Possible overdose, lethargic EXAM: CT HEAD WITHOUT CONTRAST CT CERVICAL SPINE WITHOUT CONTRAST TECHNIQUE: Multidetector CT imaging of the head and cervical spine was performed following the standard protocol without intravenous contrast. Multiplanar CT image reconstructions of the cervical spine were also generated. COMPARISON:  MRI 10/12/2017, CT brain 10/09/2017 FINDINGS: CT HEAD FINDINGS Brain: No acute territorial infarction, hemorrhage or intracranial mass. Mild atrophy. Mild small vessel ischemic changes of the white matter. Stable ventricle size. Vascular: No hyperdense vessels.  Carotid vascular calcification Skull: Normal. Negative for fracture or focal lesion. Sinuses/Orbits: Mucosal thickening in the right maxillary and ethmoid sinuses Other: Mild periorbital soft tissue swelling. CT CERVICAL SPINE FINDINGS Alignment: No subluxation.  Facet alignment within normal limits Skull base and vertebrae: No acute fracture. No primary bone lesion or focal pathologic process. Fluid in the left mastoid. Soft tissues and spinal canal: No prevertebral fluid or swelling. No visible canal hematoma. Disc levels: Mild to moderate degenerative changes C4-C5, C5-C6, C6-C7 and C7-T1. Multiple level bilateral facet degenerative change. Upper chest: Lung apices demonstrate an 7 mm left upper lobe pulmonary nodule. There are postsurgical changes  of the left carotid artery. Other: None IMPRESSION: 1. No CT evidence for acute intracranial abnormality. Atrophy and small vessel ischemic changes of the white matter 2. Degenerative changes of the cervical spine without acute osseous abnormality 3. Left mastoid effusion 4. 7 mm left apical pulmonary nodule. Dedicated chest CT may be performed for more thorough evaluation of the lung parenchyma, this could be performed on a nonemergent basis. Electronically Signed   By: Donavan Foil M.D.   On: 08/01/2018 20:48   Ct Cervical Spine Wo Contrast  Result Date: 08/01/2018 CLINICAL DATA:  Possible overdose, lethargic EXAM: CT HEAD WITHOUT CONTRAST CT CERVICAL SPINE WITHOUT CONTRAST TECHNIQUE: Multidetector CT imaging of the head and cervical spine was performed following the standard protocol without intravenous contrast. Multiplanar CT image reconstructions of the cervical spine were also generated. COMPARISON:  MRI 10/12/2017, CT brain 10/09/2017 FINDINGS: CT HEAD FINDINGS Brain: No acute territorial infarction, hemorrhage or intracranial mass. Mild atrophy. Mild small vessel ischemic changes of the white matter. Stable ventricle size. Vascular: No hyperdense vessels.  Carotid vascular calcification Skull: Normal. Negative for fracture or focal lesion. Sinuses/Orbits: Mucosal thickening in the right maxillary and ethmoid sinuses Other: Mild periorbital soft tissue swelling. CT CERVICAL SPINE FINDINGS Alignment: No subluxation.  Facet alignment within normal limits Skull base and vertebrae: No acute fracture. No primary bone lesion or focal pathologic process. Fluid in the left mastoid. Soft tissues and spinal canal: No prevertebral fluid or swelling. No visible canal hematoma. Disc levels: Mild to moderate degenerative changes C4-C5, C5-C6, C6-C7 and C7-T1. Multiple level bilateral facet degenerative change. Upper chest: Lung apices demonstrate an 7 mm left upper lobe pulmonary nodule. There are postsurgical  changes of the left carotid artery. Other: None IMPRESSION: 1. No CT evidence for acute intracranial abnormality. Atrophy and small vessel ischemic changes of the white matter 2. Degenerative changes of the cervical spine without acute osseous abnormality 3. Left mastoid effusion 4. 7 mm left apical pulmonary nodule. Dedicated chest CT may be performed for more thorough evaluation of the lung parenchyma, this could be performed on a nonemergent basis. Electronically Signed   By: Donavan Foil M.D.   On: 08/01/2018 20:48    EKG: Independently reviewed. Sinus rhythm without acute ischemic changes  Assessment/Plan Principal Problem:   Benzodiazepine overdose of undetermined intent Active Problems:   HTN (hypertension)   Pulmonary nodule, left  [name] is a 72 y.o. female with medical history significant for COPD, CAD, diabetes, CKD, history of intentional overdose who was brought by EMS for altered mental status likely secondary to benzodiazepine overdose, unknown intent.  AMS likely due to benzodiazepine overdose: UDS positive for benzos.  [place] controlled substance database is reviewed and shows last prescription 07/14/2018 for Klonopin.  She is currently protecting her airway however continues to be altered and at risk to self with fall in the ED.  We will continue supportive care with IV fluids and monitor in the stepdown unit.  Bedside sitter requested.  May need psychiatry consult when patient is more alert and oriented.  Hypertension/CAD: Blood pressure is elevated, most recently 176/84.  Holding home oral meds (amlodipine 10 daily, Toprol-XL 12.5 mg daily) due to altered mental status.  PRN IV hydralazine ordered.  Anxiety: Takes duloxetine 40 mg daily and Klonopin 1 mg twice daily as needed.  Holding both as above.  Left apical pulmonary nodule seen on CT: Seen on CT cervical spine without contrast 08/01/18.  Can evaluate with dedicated chest CT on nonemergent basis.  DVT  prophylaxis: Lovenox  Code Status: Full per prior  Family Communication: No family present Disposition Plan: Pending clinical course, potential psych theatric evaluation  Consults called: None  Admission status: Stepdown observation   Zada Finders MD [facility] Pager [phone]  If 7PM-7AM, please contact night-coverage [email or web address] Password [email or web address]  08/02/2018, 2:06 AM

## 2018-08-02 ENCOUNTER — Other Ambulatory Visit: Payer: Self-pay

## 2018-08-02 DIAGNOSIS — G4700 Insomnia, unspecified: Secondary | ICD-10-CM

## 2018-08-02 DIAGNOSIS — T40604A Poisoning by unspecified narcotics, undetermined, initial encounter: Secondary | ICD-10-CM | POA: Diagnosis not present

## 2018-08-02 DIAGNOSIS — T424X4A Poisoning by benzodiazepines, undetermined, initial encounter: Secondary | ICD-10-CM

## 2018-08-02 DIAGNOSIS — R911 Solitary pulmonary nodule: Secondary | ICD-10-CM | POA: Diagnosis not present

## 2018-08-02 DIAGNOSIS — R400 Somnolence: Secondary | ICD-10-CM

## 2018-08-02 DIAGNOSIS — F17210 Nicotine dependence, cigarettes, uncomplicated: Secondary | ICD-10-CM | POA: Diagnosis not present

## 2018-08-02 DIAGNOSIS — F321 Major depressive disorder, single episode, moderate: Secondary | ICD-10-CM | POA: Diagnosis not present

## 2018-08-02 DIAGNOSIS — G9341 Metabolic encephalopathy: Secondary | ICD-10-CM | POA: Diagnosis not present

## 2018-08-02 DIAGNOSIS — I10 Essential (primary) hypertension: Secondary | ICD-10-CM

## 2018-08-02 DIAGNOSIS — T424X4D Poisoning by benzodiazepines, undetermined, subsequent encounter: Secondary | ICD-10-CM

## 2018-08-02 LAB — URINALYSIS, ROUTINE W REFLEX MICROSCOPIC
Bilirubin Urine: NEGATIVE
Glucose, UA: NEGATIVE mg/dL
Hgb urine dipstick: NEGATIVE
Ketones, ur: 5 mg/dL — AB
Leukocytes, UA: NEGATIVE
Nitrite: NEGATIVE
Protein, ur: NEGATIVE mg/dL
Specific Gravity, Urine: 1.01 (ref 1.005–1.030)
pH: 5 (ref 5.0–8.0)

## 2018-08-02 LAB — BASIC METABOLIC PANEL
Anion gap: 10 (ref 5–15)
BUN: 16 mg/dL (ref 8–23)
CO2: 23 mmol/L (ref 22–32)
Calcium: 8.8 mg/dL — ABNORMAL LOW (ref 8.9–10.3)
Chloride: 113 mmol/L — ABNORMAL HIGH (ref 98–111)
Creatinine, Ser: 1.06 mg/dL — ABNORMAL HIGH (ref 0.44–1.00)
GFR calc Af Amer: 59 mL/min — ABNORMAL LOW (ref >60)
GFR calc non Af Amer: 51 mL/min — ABNORMAL LOW (ref >60)
Glucose, Bld: 98 mg/dL (ref 70–99)
Potassium: 3.5 mmol/L (ref 3.5–5.1)
Sodium: 146 mmol/L — ABNORMAL HIGH (ref 135–145)

## 2018-08-02 LAB — CBC
HCT: 37.2 % (ref 36.0–46.0)
Hemoglobin: 11.4 g/dL — ABNORMAL LOW (ref 12.0–15.0)
MCH: 30.7 pg (ref 26.0–34.0)
MCHC: 30.6 g/dL (ref 30.0–36.0)
MCV: 100.3 fL — ABNORMAL HIGH (ref 78.0–100.0)
Platelets: 286 10*3/uL (ref 150–400)
RBC: 3.71 MIL/uL — ABNORMAL LOW (ref 3.87–5.11)
RDW: 15.9 % — ABNORMAL HIGH (ref 11.5–15.5)
WBC: 8.9 10*3/uL (ref 4.0–10.5)

## 2018-08-02 LAB — MRSA PCR SCREENING: MRSA by PCR: POSITIVE — AB

## 2018-08-02 LAB — RAPID URINE DRUG SCREEN, HOSP PERFORMED
Amphetamines: NOT DETECTED
Barbiturates: NOT DETECTED
Benzodiazepines: POSITIVE — AB
Cocaine: NOT DETECTED
Opiates: NOT DETECTED
Tetrahydrocannabinol: NOT DETECTED

## 2018-08-02 MED ORDER — SODIUM CHLORIDE 0.9% FLUSH
3.0000 mL | Freq: Two times a day (BID) | INTRAVENOUS | Status: DC
  Administered 2018-08-02 – 2018-08-04 (×6): 3 mL via INTRAVENOUS

## 2018-08-02 MED ORDER — ENOXAPARIN SODIUM 40 MG/0.4ML ~~LOC~~ SOLN
40.0000 mg | SUBCUTANEOUS | Status: DC
  Administered 2018-08-02 – 2018-08-04 (×3): 40 mg via SUBCUTANEOUS
  Filled 2018-08-02 (×3): qty 0.4

## 2018-08-02 MED ORDER — ALPRAZOLAM 0.5 MG PO TABS
0.5000 mg | ORAL_TABLET | Freq: Two times a day (BID) | ORAL | Status: DC | PRN
  Administered 2018-08-02 – 2018-08-04 (×3): 0.5 mg via ORAL
  Filled 2018-08-02 (×3): qty 1

## 2018-08-02 MED ORDER — ASPIRIN EC 81 MG PO TBEC
81.0000 mg | DELAYED_RELEASE_TABLET | Freq: Every day | ORAL | Status: DC
  Administered 2018-08-02 – 2018-08-04 (×3): 81 mg via ORAL
  Filled 2018-08-02 (×3): qty 1

## 2018-08-02 MED ORDER — CHLORHEXIDINE GLUCONATE CLOTH 2 % EX PADS
6.0000 | MEDICATED_PAD | Freq: Every day | CUTANEOUS | Status: DC
  Administered 2018-08-03: 6 via TOPICAL

## 2018-08-02 MED ORDER — POTASSIUM CHLORIDE IN NACL 40-0.9 MEQ/L-% IV SOLN
INTRAVENOUS | Status: AC
  Administered 2018-08-02: 100 mL/h via INTRAVENOUS
  Filled 2018-08-02 (×2): qty 1000

## 2018-08-02 MED ORDER — MIRTAZAPINE 15 MG PO TABS
7.5000 mg | ORAL_TABLET | Freq: Every day | ORAL | Status: DC
  Administered 2018-08-02 – 2018-08-03 (×2): 7.5 mg via ORAL
  Filled 2018-08-02 (×2): qty 1

## 2018-08-02 MED ORDER — TRAMADOL HCL 50 MG PO TABS
50.0000 mg | ORAL_TABLET | Freq: Once | ORAL | Status: AC
  Administered 2018-08-02: 50 mg via ORAL
  Filled 2018-08-02: qty 1

## 2018-08-02 MED ORDER — AMLODIPINE BESYLATE 10 MG PO TABS
10.0000 mg | ORAL_TABLET | Freq: Every day | ORAL | Status: DC
  Administered 2018-08-02 – 2018-08-04 (×3): 10 mg via ORAL
  Filled 2018-08-02 (×3): qty 1

## 2018-08-02 MED ORDER — ACETAMINOPHEN 325 MG PO TABS
650.0000 mg | ORAL_TABLET | Freq: Four times a day (QID) | ORAL | Status: DC | PRN
  Administered 2018-08-04: 650 mg via ORAL
  Filled 2018-08-02: qty 2

## 2018-08-02 MED ORDER — METOPROLOL SUCCINATE ER 25 MG PO TB24
12.5000 mg | ORAL_TABLET | Freq: Every day | ORAL | Status: DC
  Administered 2018-08-03 – 2018-08-04 (×2): 12.5 mg via ORAL
  Filled 2018-08-02 (×2): qty 1

## 2018-08-02 MED ORDER — ENSURE ENLIVE PO LIQD
237.0000 mL | Freq: Two times a day (BID) | ORAL | Status: DC
  Administered 2018-08-03: 237 mL via ORAL

## 2018-08-02 MED ORDER — ATORVASTATIN CALCIUM 20 MG PO TABS
20.0000 mg | ORAL_TABLET | Freq: Every day | ORAL | Status: DC
  Administered 2018-08-02 – 2018-08-03 (×2): 20 mg via ORAL
  Filled 2018-08-02 (×2): qty 1

## 2018-08-02 MED ORDER — NICOTINE 21 MG/24HR TD PT24
21.0000 mg | MEDICATED_PATCH | Freq: Every day | TRANSDERMAL | Status: DC
  Administered 2018-08-02 – 2018-08-04 (×3): 21 mg via TRANSDERMAL
  Filled 2018-08-02 (×3): qty 1

## 2018-08-02 MED ORDER — MUPIROCIN 2 % EX OINT
1.0000 "application " | TOPICAL_OINTMENT | Freq: Two times a day (BID) | CUTANEOUS | Status: DC
  Administered 2018-08-02 – 2018-08-04 (×5): 1 via NASAL
  Filled 2018-08-02 (×3): qty 22

## 2018-08-02 MED ORDER — ACETAMINOPHEN 650 MG RE SUPP: 650.0000 mg | Freq: Four times a day (QID) | RECTAL | Status: DC | PRN

## 2018-08-02 MED ORDER — HYDRALAZINE HCL 20 MG/ML IJ SOLN: 10.0000 mg | INTRAMUSCULAR | Status: DC | PRN

## 2018-08-02 NOTE — Evaluation (Signed)
Physical Therapy Evaluation Patient Details Name: [name] MRN: [id number] DOB: June 18, 1946 Today's Date: 08/02/2018   History of Present Illness  Pt is a 72 y.o. female with medical history significant for COPD, CAD, CKD, history of intentional overdose who was brought by EMS for altered mental status.  Per ED provider, EMS noted Klonopin as potential overdose medication.Initially patient was unresponsive to deep pressure, painful stimuli, did receive Narcan with some response.  Clinical Impression  Pt unable to give a reliable account of her prior level of function and her home living as her answers kept changing. Pt states that she was using a RW to walk around her home and that she has an aide that helps, unclear if aide only helps with cooking/cleaning or also bathing dressing. Pt currently limited in safe mobility by decreased cognition especially poor safety awareness along with decreased strength and balance. Pt requires mod A for bed mobility, transfers and ambulation of 60 feet with RW. PT recommends SNF level rehab at discharge to improve stability and safety awareness. PT will continue to follow acutely.     Follow Up Recommendations SNF    Equipment Recommendations  None recommended by PT       Precautions / Restrictions Precautions Precautions: Fall Restrictions Weight Bearing Restrictions: No      Mobility  Bed Mobility Overal bed mobility: Needs Assistance Bed Mobility: Supine to Sit     Supine to sit: Min assist     General bed mobility comments: minA for bringing trunk to upright, pt able to manage LE off bed  Transfers Overall transfer level: Needs assistance   Transfers: Sit to/from Stand Sit to Stand: Min assist;From elevated surface         General transfer comment: minA for power up and steadying at RW, vc for hand placement for power up and slowed descent, pt did not follow cues, decreased eccentric lowering into  chair  Ambulation/Gait Ambulation/Gait assistance: Mod assist Gait Distance (Feet): 60 Feet Assistive device: Rolling walker (2 wheeled) Gait Pattern/deviations: Step-through pattern;Decreased stride length;Shuffle;Trunk flexed Gait velocity: slowed Gait velocity interpretation: <1.31 ft/sec, indicative of household ambulator General Gait Details: modA to steady for 3x LoB with slow, shuffling gait with periodic knee buckling        Balance Overall balance assessment: Needs assistance Sitting-balance support: Feet supported;No upper extremity supported Sitting balance-Leahy Scale: Fair     Standing balance support: Bilateral upper extremity supported Standing balance-Leahy Scale: Poor Standing balance comment: requires RW support                             Pertinent Vitals/Pain Pain Assessment: Faces Faces Pain Scale: Hurts little more Pain Location: generalized with ambulation  Pain Descriptors / Indicators: Grimacing Pain Intervention(s): Limited activity within patient's tolerance;Monitored during session;Repositioned    Home Living Family/patient expects to be discharged to:: Skilled nursing facility Living Arrangements: Non-relatives/Friends               Additional Comments: pt inconsistant in description of home living situation,    Prior Function Level of Independence: Needs assistance   Gait / Transfers Assistance Needed: ambulates with RW  ADL's / Homemaking Assistance Needed: friend assists with some iADLs, has an aide, unclear on level of help   Comments: pt poor historian, talks in circles, when first asked gets in shower and bathes herself, second time states she had aide who bathes her and she hasn't been in shower  in months     Hand Dominance        Extremity/Trunk Assessment   Upper Extremity Assessment Upper Extremity Assessment: Generalized weakness    Lower Extremity Assessment Lower Extremity Assessment: Generalized  weakness    Cervical / Trunk Assessment Cervical / Trunk Assessment: Kyphotic  Communication   Communication: Expressive difficulties(slowed speech )  Cognition Arousal/Alertness: Lethargic Behavior During Therapy: Anxious;Impulsive Overall Cognitive Status: History of cognitive impairments - at baseline                                        General Comments General comments (skin integrity, edema, etc.): Pt with difficulty holding eyes open throughout session despite repeated cuing. VSS         Assessment/Plan    PT Assessment Patient needs continued PT services  PT Problem List Decreased strength;Decreased activity tolerance;Decreased balance;Decreased mobility;Decreased cognition;Decreased safety awareness;Decreased knowledge of use of DME       PT Treatment Interventions DME instruction;Stair training;Functional mobility training;Therapeutic activities;Therapeutic exercise;Balance training;Cognitive remediation;Patient/family education    PT Goals (Current goals can be found in the Care Plan section)  Acute Rehab PT Goals Patient Stated Goal: none stated PT Goal Formulation: With patient Time For Goal Achievement: 08/16/18 Potential to Achieve Goals: Fair    Frequency Min 2X/week   Barriers to discharge Decreased caregiver support         AM-PAC PT "6 Clicks" Daily Activity  Outcome Measure Difficulty turning over in bed (including adjusting bedclothes, sheets and blankets)?: A Lot Difficulty moving from lying on back to sitting on the side of the bed? : Unable Difficulty sitting down on and standing up from a chair with arms (e.g., wheelchair, bedside commode, etc,.)?: Unable Help needed moving to and from a bed to chair (including a wheelchair)?: A Lot Help needed walking in hospital room?: A Lot Help needed climbing 3-5 steps with a railing? : Total 6 Click Score: 9    End of Session Equipment Utilized During Treatment: Gait belt Activity  Tolerance: Patient limited by lethargy Patient left: in chair;with call bell/phone within reach;with chair alarm set;with nursing/sitter in room Nurse Communication: Mobility status;Precautions PT Visit Diagnosis: Unsteadiness on feet (R26.81);Other abnormalities of gait and mobility (R26.89);Muscle weakness (generalized) (M62.81);Difficulty in walking, not elsewhere classified (R26.2);History of falling (Z91.81)    Time: 0569-7948 PT Time Calculation (min) (ACUTE ONLY): 25 min   Charges:   PT Evaluation $PT Eval Moderate Complexity: 1 Mod PT Treatments $Gait Training: 8-22 mins        Joshua Zeringue B. Migdalia Dk PT, DPT Acute Rehabilitation Services Pager [phone] Office [phone]   [place] 08/02/2018, 6:04 PM

## 2018-08-02 NOTE — ED Notes (Signed)
Report given to rn on 2w
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
## 2018-08-02 NOTE — ED Notes (Signed)
Staffing office contacted safety sitter needed for fall risk

## 2018-08-02 NOTE — ED Notes (Signed)
Safety zone to be completed

## 2018-08-02 NOTE — Consult Note (Signed)
[facility] Consult   Reason for Consult:''depression, OD on Klonopin'' Referring Physician:  Dr. Cruzita Lederer Patient Identification: [name] MRN:  [id number] Principal Diagnosis: Major depressive disorder, single episode, moderate ([place]) Diagnosis:   Patient Active Problem List   Diagnosis Date Noted  . Pulmonary nodule, left [R91.1] 08/02/2018  . Major depressive disorder, single episode, moderate ([place]) [F32.1] 08/02/2018  . Benzodiazepine overdose of undetermined intent [T42.4X4A] 08/01/2018  . Cellulitis [L03.90] 05/14/2018  . Anemia [D64.9] 05/14/2018  . At risk for adverse drug event [Z91.89] 10/17/2017  . Salicylate intoxication, undetermined intent, subsequent encounter [T39.094D] 10/10/2017  . Acute renal failure superimposed on chronic kidney disease ([place]) [N17.9, N18.9] 10/10/2017  . Headache [R51] 10/10/2017  . Protein-calorie malnutrition, severe [E43] 01/26/2016  . Opiate overdose ([place]) [T40.601A] 01/25/2016  . Suicide attempt ([place]) [T14.91XA] 01/25/2016  . Depression [F32.9]   . Dyslipidemia [E78.5]   . Gastroesophageal reflux disease without esophagitis [K21.9]   . COPD (chronic obstructive pulmonary disease) ([place]) [J44.9] 07/29/2015  . Diabetes mellitus with neurological manifestation ([place]) [E11.49] 07/29/2015  . Frequent falls [R29.6] 07/29/2015  . HTN (hypertension) [I10] 07/29/2015  . AKI (acute kidney injury) ([place]) [N17.9] 07/29/2015  . SIRS (systemic inflammatory response syndrome) (H[facility]) [R65.10] 07/29/2015  . Spinal stenosis of lumbar region [M48.061] 04/23/2013  . Tremor due to multiple drugs [G25.1] 04/23/2013  . Tobacco use disorder [F17.200] 04/23/2013  . COPD exacerbation ([place]) [J44.1] 04/23/2013  . Occlusion and stenosis of carotid artery without mention of cerebral infarction [I65.29] 03/12/2012  . Viral bronchitis-possible h. infl vs Norovirus [J20.8] 11/21/2011  . Pleuritic chest pain [R07.81] 09/18/2011    Total Time spent  with patient: 45 minutes  Subjective:  ''altered mental status''  HPI:   [name] is a 72 y.o. female with history of COPD, coronary artery disease, chronic kidney disease stage 3 who was admitted due to altered mental status. 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 She states that she has depression all her lifer but has been feeling more depressed lately. She also reports difficulty sleeping, poor appetite, lack of motivation, poor energy level, poor appetite and feeling overwhelm due to living alone. However, she denies taking Clonazepam because of suicide, rather she reports taking 3 tablets of 0.5 mg of Clonazepam to sleep well. Today, patient denies psychosis, delusions, suicidal or homicidal ideation, intent or plan. She is open to take antidepressant and counseling.  Past Psychiatric History: patient denies  Risk to Self:  denies Risk to Others:  denies Prior Inpatient Therapy:  none reported by patient Prior Outpatient Therapy:  none  Past Medical History:  Past Medical History:  Diagnosis Date  . Anxiety and depression   . Arthritis   . Asthma   . Carotid artery occlusion   . Chronic kidney disease   . Chronic pain    leg and feet  . COPD (chronic obstructive pulmonary disease) ([place])   . Coronary artery disease   . DDD (degenerative disc disease)   . Depression   . Diarrhea    chronic   . DVT (deep venous thrombosis) ([place])   . Fibromyalgia   . GERD (gastroesophageal reflux disease)   . Grave's disease   . Headache   . Hypertension   . OP (osteoporosis)   . Parkinson's disease ([place])   . Peripheral vascular disease ([place])   . PUD (peptic ulcer disease)   . Recurrent upper respiratory  infection (URI)   . Rhinitis   . Sleep apnea   . Spinal stenosis of lumbar region 04/23/2013  . Thrombophlebitis   . Tobacco use disorder 04/23/2013  . Vitamin B 12 deficiency     Past  Surgical History:  Procedure Laterality Date  . CAROTID ENDARTERECTOMY Left Sept. 20,2011   cea  . CHOLECYSTECTOMY  1999  . GASTRECTOMY     age 55   Part of small intestin and part of stomach  . LEFT HEART CATHETERIZATION WITH CORONARY ANGIOGRAM N/A 10/02/2011   Procedure: LEFT HEART CATHETERIZATION WITH CORONARY ANGIOGRAM;  Surgeon: Sueanne Margarita, MD;  Location: [facility];  Service: Cardiovascular;  Laterality: N/A;   Family History:  Family History  Problem Relation Age of Onset  . Diabetes Mother   . Hyperlipidemia Mother   . Heart attack Mother   . Other Mother        varicose veins,respiratory,stroke  . Heart disease Mother        before age 7  . Hypertension Mother   . Varicose Veins Mother   . Diabetes Father   . Heart disease Father        before age 33  . Hyperlipidemia Father   . Heart attack Father   . Other Father        varicose veins  . Hypertension Father   . Varicose Veins Father   . Diabetes Sister   . Heart disease Sister        before age 75  . Hyperlipidemia Sister   . Heart attack Sister   . Other Sister        varicose veins  . Hypertension Sister   . Varicose Veins Sister   . Peripheral vascular disease Sister   . Diabetes Sister   . Hyperlipidemia Sister   . Hypertension Sister   . Varicose Veins Sister    Family Psychiatric  History:  Social History:  Social History   Substance and Sexual Activity  Alcohol Use No  . Alcohol/week: 0.0 standard drinks     Social History   Substance and Sexual Activity  Drug Use No    Social History   Socioeconomic History  . Marital status: Widowed    Spouse name: Not on file  . Number of children: 1  . Years of education: Not on file  . Highest education level: Not on file  Occupational History  . Not on file  Social Needs  . Financial resource strain: Not on file  . Food insecurity:    Worry: Not on file    Inability: Not on file  . Transportation needs:    Medical: Not on file     Non-medical: Not on file  Tobacco Use  . Smoking status: Current Every Day Smoker    Packs/day: 2.00    Years: 50.00    Pack years: 100.00    Types: Cigarettes  . Smokeless tobacco: Never Used  . Tobacco comment: cessation info given and reviewed  Substance and Sexual Activity  . Alcohol use: No    Alcohol/week: 0.0 standard drinks  . Drug use: No  . Sexual activity: Never    Birth control/protection: Post-menopausal  Lifestyle  . Physical activity:    Days per week: Not on file    Minutes per session: Not on file  . Stress: Not on file  Relationships  . Social connections:    Talks on phone: Not on file    Gets together: Not on  file    Attends religious service: Not on file    Active member of club or organization: Not on file    Attends meetings of clubs or organizations: Not on file    Relationship status: Not on file  Other Topics Concern  . Not on file  Social History Narrative  . Not on file   Additional Social History:    Allergies:  No Known Allergies  Labs:  Results for orders placed or performed during the hospital encounter of 08/01/18 (from the past 48 hour(s))  Ammonia     Status: None   Collection Time: 08/01/18  6:26 PM  Result Value Ref Range   Ammonia 18 9 - 35 umol/L    Comment: Performed at [facility], [place] [address], [place], [place] [address]  Comprehensive metabolic panel     Status: Abnormal   Collection Time: 08/01/18  6:44 PM  Result Value Ref Range   Sodium 144 135 - 145 mmol/L   Potassium 3.2 (L) 3.5 - 5.1 mmol/L   Chloride 111 98 - 111 mmol/L   CO2 23 22 - 32 mmol/L   Glucose, Bld 111 (H) 70 - 99 mg/dL   BUN 18 8 - 23 mg/dL   Creatinine, Ser 1.09 (H) 0.44 - 1.00 mg/dL   Calcium 9.2 8.9 - 10.3 mg/dL   Total Protein 6.8 6.5 - 8.1 g/dL   Albumin 3.8 3.5 - 5.0 g/dL   AST 25 15 - 41 U/L   ALT 17 0 - 44 U/L   Alkaline Phosphatase 126 38 - 126 U/L   Total Bilirubin 0.7 0.3 - 1.2 mg/dL   GFR calc non Af Amer 49 (L) >60 mL/min    GFR calc Af Amer 57 (L) >60 mL/min    Comment: (NOTE) The eGFR has been calculated using the CKD EPI equation. This calculation has not been validated in all clinical situations. eGFR's persistently <60 mL/min signify possible Chronic Kidney Disease.    Anion gap 10 5 - 15    Comment: Performed at [facility] [address], [place], [place] [address]  CBC WITH DIFFERENTIAL     Status: Abnormal   Collection Time: 08/01/18  6:44 PM  Result Value Ref Range   WBC 11.2 (H) 4.0 - 10.5 K/uL   RBC 3.98 3.87 - 5.11 MIL/uL   Hemoglobin 12.1 12.0 - 15.0 g/dL   HCT 39.9 36.0 - 46.0 %   MCV 100.3 (H) 78.0 - 100.0 fL   MCH 30.4 26.0 - 34.0 pg   MCHC 30.3 30.0 - 36.0 g/dL   RDW 15.9 (H) 11.5 - 15.5 %   Platelets 284 150 - 400 K/uL   Neutrophils Relative % 90 %   Neutro Abs 10.0 (H) 1.7 - 7.7 K/uL   Lymphocytes Relative 4 %   Lymphs Abs 0.4 (L) 0.7 - 4.0 K/uL   Monocytes Relative 6 %   Monocytes Absolute 0.6 0.1 - 1.0 K/uL   Eosinophils Relative 0 %   Eosinophils Absolute 0.0 0.0 - 0.7 K/uL   Basophils Relative 0 %   Basophils Absolute 0.1 0.0 - 0.1 K/uL   Immature Granulocytes 0 %   Abs Immature Granulocytes 0.1 0.0 - 0.1 K/uL    Comment: Performed at [facility], [address] [place], [place] [address]  I-Stat Chem 8, ED     Status: Abnormal   Collection Time: 08/01/18  7:14 PM  Result Value Ref Range   Sodium  145 135 - 145 mmol/L   Potassium 3.2 (L) 3.5 - 5.1 mmol/L   Chloride 112 (H) 98 - 111 mmol/L   BUN 21 8 - 23 mg/dL   Creatinine, Ser 1.00 0.44 - 1.00 mg/dL   Glucose, Bld 113 (H) 70 - 99 mg/dL   Calcium, Ion 1.20 1.15 - 1.40 mmol/L   TCO2 23 22 - 32 mmol/L   Hemoglobin 13.3 12.0 - 15.0 g/dL   HCT 39.0 36.0 - 46.0 %  I-Stat CG4 Lactic Acid, ED     Status: Abnormal   Collection Time: 08/01/18  7:14 PM  Result Value Ref Range   Lactic Acid, Venous 1.92 (H) 0.5 - 1.9 mmol/L  Ethanol     Status: None   Collection Time: 08/01/18  7:47 PM  Result Value Ref  Range   Alcohol, Ethyl (B) <10 <10 mg/dL    Comment: (NOTE) Lowest detectable limit for serum alcohol is 10 mg/dL. For medical purposes only. Performed at [facility], [place] [address], [place], [place] [address]   Acetaminophen level     Status: Abnormal   Collection Time: 08/01/18  7:47 PM  Result Value Ref Range   Acetaminophen (Tylenol), Serum <10 (L) 10 - 30 ug/mL    Comment: (NOTE) Therapeutic concentrations vary significantly. A range of 10-30 ug/mL  may be an effective concentration for many patients. However, some  are best treated at concentrations outside of this range. Acetaminophen concentrations >150 ug/mL at 4 hours after ingestion  and >50 ug/mL at 12 hours after ingestion are often associated with  toxic reactions. Performed at [facility], [place] [address], [place], [place] [address]   Salicylate level     Status: None   Collection Time: 08/01/18  7:47 PM  Result Value Ref Range   Salicylate Lvl <7.6 2.8 - 30.0 mg/dL    Comment: Performed at [facility] [address], [place], [place] [address]  I-Stat venous blood gas, ED     Status: Abnormal   Collection Time: 08/01/18 10:48 PM  Result Value Ref Range   pH, Ven 7.321 7.250 - 7.430   pCO2, Ven 46.4 44.0 - 60.0 mmHg   pO2, Ven 28.0 (LL) 32.0 - 45.0 mmHg   Bicarbonate 23.9 20.0 - 28.0 mmol/L   TCO2 25 22 - 32 mmol/L   O2 Saturation 48.0 %   Acid-base deficit 2.0 0.0 - 2.0 mmol/L   Patient temperature HIDE    Sample type VENOUS    Comment NOTIFIED PHYSICIAN   Urine rapid drug screen (hosp performed)     Status: Abnormal   Collection Time: 08/01/18 11:22 PM  Result Value Ref Range   Opiates NONE DETECTED NONE DETECTED   Cocaine NONE DETECTED NONE DETECTED   Benzodiazepines POSITIVE (A) NONE DETECTED   Amphetamines NONE DETECTED NONE DETECTED   Tetrahydrocannabinol NONE DETECTED NONE DETECTED   Barbiturates NONE DETECTED NONE DETECTED    Comment: (NOTE) DRUG SCREEN FOR MEDICAL  PURPOSES ONLY.  IF CONFIRMATION IS NEEDED FOR ANY PURPOSE, NOTIFY LAB WITHIN 5 DAYS. LOWEST DETECTABLE LIMITS FOR URINE DRUG SCREEN Drug Class                     Cutoff (ng/mL) Amphetamine and metabolites    1000 Barbiturate and metabolites    200 Benzodiazepine                 354 Tricyclics and metabolites     300 Opiates  and metabolites        300 Cocaine and metabolites        300 THC                            50 Performed at [facility], [place] [address] [place], [place] [address]   Urinalysis, Routine w reflex microscopic     Status: Abnormal   Collection Time: 08/01/18 11:22 PM  Result Value Ref Range   Color, Urine STRAW (A) YELLOW   APPearance CLEAR CLEAR   Specific Gravity, Urine 1.010 1.005 - 1.030   pH 5.0 5.0 - 8.0   Glucose, UA NEGATIVE NEGATIVE mg/dL   Hgb urine dipstick NEGATIVE NEGATIVE   Bilirubin Urine NEGATIVE NEGATIVE   Ketones, ur 5 (A) NEGATIVE mg/dL   Protein, ur NEGATIVE NEGATIVE mg/dL   Nitrite NEGATIVE NEGATIVE   Leukocytes, UA NEGATIVE NEGATIVE    Comment: Performed at [facility] [address] [place], [place] [address]  MRSA PCR Screening     Status: Abnormal   Collection Time: 08/02/18 12:50 AM  Result Value Ref Range   MRSA by PCR POSITIVE (A) NEGATIVE    Comment:        The GeneXpert MRSA Assay (FDA approved for NASAL specimens only), is one component of a comprehensive MRSA colonization surveillance program. It is not intended to diagnose MRSA infection nor to guide or monitor treatment for MRSA infections. CRITICAL RESULT CALLED TO, READ BACK BY AND VERIFIED WITH: CALLED RN GLADYS @ 0322 ON 08/02/18 BY ROBINSON Z.    Basic metabolic panel     Status: Abnormal   Collection Time: 08/02/18  3:19 AM  Result Value Ref Range   Sodium 146 (H) 135 - 145 mmol/L   Potassium 3.5 3.5 - 5.1 mmol/L   Chloride 113 (H) 98 - 111 mmol/L   CO2 23 22 - 32 mmol/L   Glucose, Bld 98 70 - 99 mg/dL   BUN 16 8 - 23 mg/dL    Creatinine, Ser 1.06 (H) 0.44 - 1.00 mg/dL   Calcium 8.8 (L) 8.9 - 10.3 mg/dL   GFR calc non Af Amer 51 (L) >60 mL/min   GFR calc Af Amer 59 (L) >60 mL/min    Comment: (NOTE) The eGFR has been calculated using the CKD EPI equation. This calculation has not been validated in all clinical situations. eGFR's persistently <60 mL/min signify possible Chronic Kidney Disease.    Anion gap 10 5 - 15    Comment: Performed at [facility] [address], [place], [place] [address]  CBC     Status: Abnormal   Collection Time: 08/02/18  3:19 AM  Result Value Ref Range   WBC 8.9 4.0 - 10.5 K/uL   RBC 3.71 (L) 3.87 - 5.11 MIL/uL   Hemoglobin 11.4 (L) 12.0 - 15.0 g/dL   HCT 37.2 36.0 - 46.0 %   MCV 100.3 (H) 78.0 - 100.0 fL   MCH 30.7 26.0 - 34.0 pg   MCHC 30.6 30.0 - 36.0 g/dL   RDW 15.9 (H) 11.5 - 15.5 %   Platelets 286 150 - 400 K/uL    Comment: Performed at [facility], [place] [address], [place],  [address]    Current Facility-Administered Medications  Medication Dose Route Frequency Provider Last Rate Last Dose  . 0.9 % NaCl with KCl 40 mEq / L  infusion   Intravenous  Continuous Zada Finders R, MD 100 mL/hr at 08/02/18 0253 100 mL/hr at 08/02/18 0253  . acetaminophen (TYLENOL) tablet 650 mg  650 mg Oral Q6H PRN [name], MD       Or  . acetaminophen (TYLENOL) suppository 650 mg  650 mg Rectal Q6H PRN Zada Finders R, MD      . amLODipine (NORVASC) tablet 10 mg  10 mg Oral Daily Caren Griffins, MD      . aspirin EC tablet 81 mg  81 mg Oral Daily Gherghe, Costin M, MD      . atorvastatin (LIPITOR) tablet 20 mg  20 mg Oral QHS Caren Griffins, MD      . Derrill Memo ON 08/03/2018] Chlorhexidine Gluconate Cloth 2 % PADS 6 each  6 each Topical Q0600 Gherghe, Costin M, MD      . enoxaparin (LOVENOX) injection 40 mg  40 mg Subcutaneous Q24H Zada Finders R, MD   40 mg at 08/02/18 0834  . feeding supplement (ENSURE ENLIVE) (ENSURE ENLIVE) liquid 237 mL  237 mL Oral BID BM  Gherghe, Costin M, MD      . hydrALAZINE (APRESOLINE) injection 10 mg  10 mg Intravenous Q4H PRN Zada Finders R, MD      . metoprolol succinate (TOPROL-XL) 24 hr tablet 12.5 mg  12.5 mg Oral Daily Gherghe, Costin M, MD      . mirtazapine (REMERON) tablet 7.5 mg  7.5 mg Oral QHS Tyauna Lacaze, MD      . mupirocin ointment (BACTROBAN) 2 % 1 application  1 application Nasal BID Caren Griffins, MD   1 application at 12/75/17 [phone]  . sodium chloride flush (NS) 0.9 % injection 3 mL  3 mL Intravenous Q12H [name], MD   3 mL at 08/02/18 4944    Musculoskeletal: Strength & Muscle Tone: within normal limits Gait & Station: unable to stand Patient leans: N/A  Psychiatric Specialty Exam: Physical Exam  Psychiatric: Thought content normal. Her speech is delayed. She is slowed and withdrawn. Cognition and memory are normal. She expresses impulsivity. She exhibits a depressed mood.    Review of Systems  HENT: Positive for hearing loss.   Eyes: Negative.   Gastrointestinal: Negative.   Skin: Negative.   Psychiatric/Behavioral: Positive for depression. The patient has insomnia.     Blood pressure (!) 158/73, pulse 91, temperature 98.7 F (37.1 C), temperature source Oral, resp. rate (!) 28, height _0  (1.626 m), weight 46.7 kg, SpO2 99 %.Body mass index is 17.67 kg/m.  General Appearance: Casual  Eye Contact:  Good  Speech:  Clear and Coherent  Volume:  Decreased  Mood:  Dysphoric  Affect:  Constricted  Thought Process:  Coherent and Linear  Orientation:  Full (Time, Place, and Person)  Thought Content:  Logical  Suicidal Thoughts:  No  Homicidal Thoughts:  No  Memory:  Immediate;   Good Recent;   Good Remote;   Good  Judgement:  Fair  Insight:  Fair  Psychomotor Activity:  Normal  Concentration:  Concentration: Fair and Attention Span: Fair  Recall:  Good  Fund of Knowledge:  Good  Language:  Good  Akathisia:  No  Handed:  Right  AIMS (if indicated):     Assets:   Communication Skills  ADL's:  Intact  Cognition:  WNL  Sleep:   poor     Treatment Plan Summary: 72 y.o. female patient admitted with altered mental status following ingestion of 3 tablets of 0.5  mg of Clonazepam. Patient reports that she has been dealing with depression but not suicide. She reports being stressed out lately with her friends and nothing more. However, she is willing to get treatment for depression..  Recommendations: -Consider Mirtazapine 7.5 mg daily at bedtime for depression/sleep/appetite. -Pls consider abstaining  from prescribing Benzodiazepine for a patient with COPD. -Consider social worker consult to assist with outpatient psychiatric follow up care referral. -Patient may benefit from SNF placement or assistance with home health assistance due to multiple medical problems. -Psychiatric service is signing out. Re-consult as needed.  Disposition: No evidence of imminent risk to self or others at present.   Patient does not meet criteria for psychiatric inpatient admission. Supportive therapy provided about ongoing stressors.  Corena Pilgrim, MD 08/02/2018 1:17 PM

## 2018-08-03 DIAGNOSIS — G9341 Metabolic encephalopathy: Secondary | ICD-10-CM | POA: Diagnosis not present

## 2018-08-03 DIAGNOSIS — I10 Essential (primary) hypertension: Secondary | ICD-10-CM | POA: Diagnosis not present

## 2018-08-03 DIAGNOSIS — T424X4D Poisoning by benzodiazepines, undetermined, subsequent encounter: Secondary | ICD-10-CM | POA: Diagnosis not present

## 2018-08-03 DIAGNOSIS — F321 Major depressive disorder, single episode, moderate: Secondary | ICD-10-CM | POA: Diagnosis not present

## 2018-08-03 DIAGNOSIS — R911 Solitary pulmonary nodule: Secondary | ICD-10-CM | POA: Diagnosis not present

## 2018-08-03 DIAGNOSIS — T40604A Poisoning by unspecified narcotics, undetermined, initial encounter: Secondary | ICD-10-CM | POA: Diagnosis not present

## 2018-08-03 LAB — URINE CULTURE: Culture: NO GROWTH

## 2018-08-03 MED ORDER — TRAMADOL HCL 50 MG PO TABS
25.0000 mg | ORAL_TABLET | Freq: Two times a day (BID) | ORAL | Status: DC | PRN
  Administered 2018-08-03: 25 mg via ORAL
  Filled 2018-08-03: qty 1

## 2018-08-03 NOTE — Progress Notes (Signed)
PROGRESS NOTE  [name] VOH:[id number] DOB: Jul 25, 1946 DOA: 08/01/2018 PCP: [name], MD   LOS: 0 days   Brief Narrative / Interim history: 72 year old female with history of COPD, coronary artery disease, chronic kidney disease stage 3, history of intentional overdose who was brought by EMS for altered mental status.  Initial patient was unresponsive to deep painful [place], did receive Narcan with some response.  She was admitted to the hospital on hospital service as there was no concern for airway protection  Subjective: -Feeling better this morning, no chest pain, no shortness of breath.  Slept really well.  No abdominal pain, nausea vomiting, she is hungry  Assessment & Plan: Principal Problem:   Major depressive disorder, single episode, moderate (HCC) Active Problems:   HTN (hypertension)   Benzodiazepine overdose of undetermined intent   Pulmonary nodule, left   Acute metabolic encephalopathy likely due to benzodiazepine overdose -UDS was positive for benzodiazepines.  [place] controlled substance database reviewed and shows last prescription about 2 weeks ago for [employer].  Patient tells me this morning that she can fall asleep and took 3 pills.  She claims that she was told by her primary MD that she could take 3 pills at once, even though it appears that she is only supposed to take 1 pill up to 2 times a day -She denies any concomitant substance ingestion, denies alcohol, denies drugs -Denies suicidal intent however states that she feels depressed and had a fight with her friend prior to this admission -Psychiatry consulted, appreciate input, she was started on Remeron which seems to be helping  Deconditioning -Physical therapy evaluated patient and recommended SNF to which patient is agreeable.  Consulted Education officer, museum today to start process.  Depression /anxiety -Patient that has stated that she is currently depressed, and she was more so last  night, and on interview she starts to cry stating that she got into a fight with her friend and she was yelled at and felt very sad.  Hypertension -Home medications resumed yesterday, still elevated a little bit but better, continue to monitor  Coronary artery disease -No chest pain  Left apical pulmonary nodule seen on CT -This can be evaluated with dedicated chest CT on a nonemergent basis   Scheduled Meds: . amLODipine  10 mg Oral Daily  . aspirin EC  81 mg Oral Daily  . atorvastatin  20 mg Oral QHS  . Chlorhexidine Gluconate Cloth  6 each Topical Q0600  . enoxaparin (LOVENOX) injection  40 mg Subcutaneous Q24H  . feeding supplement (ENSURE ENLIVE)  237 mL Oral BID BM  . metoprolol succinate  12.5 mg Oral Daily  . mirtazapine  7.5 mg Oral QHS  . mupirocin ointment  1 application Nasal BID  . nicotine  21 mg Transdermal Daily  . sodium chloride flush  3 mL Intravenous Q12H   Continuous Infusions:  PRN Meds:.acetaminophen **OR** acetaminophen, ALPRAZolam, hydrALAZINE  DVT prophylaxis: Lovenox Code Status: Full code Family Communication: No family at bedside, d/w Celesta Gentile on 10/5 Disposition Plan: TBD  Consultants:   Psychiatry   Procedures:   None   Antimicrobials:  None    Objective: Vitals:   08/02/18 0746 08/02/18 1707 08/02/18 2301 08/03/18 0736  BP: (!) 158/73 129/67 (!) 146/61 (!) 157/73  Pulse: 91 96 84 83  Resp: (!) 28 (!) 24 16 (!) 22  Temp: 98.7 F (37.1 C) 98.8 F (37.1 C) 97.7 F (36.5 C) 99.2 F (37.3 C)  TempSrc: Oral Oral  Oral Oral  SpO2: 99% 100% 100% 98%  Weight:      Height:        Intake/Output Summary (Last 24 hours) at 08/03/2018 1146 Last data filed at 08/03/2018 0800 Gross per 24 hour  Intake 552.24 ml  Output 1000 ml  Net -447.76 ml   Filed Weights   08/02/18 0051  Weight: 46.7 kg    Examination:  Constitutional: No distress, ENMT: Moist mucous membranes Neck: Normal, supple Respiratory: Clear to auscultation  bilaterally without wheezing or crackles Cardiovascular: Regular rate and rhythm, no murmurs heard.  No peripheral edema Abdomen: Soft, nontender, nondistended, bowel sounds positive Musculoskeletal: No clubbing cyanosis Skin: No rashes seen Neurologic: Nonfocal, equal strength Psychiatric: Alert and oriented x3   Data Reviewed: I have independently reviewed following labs and imaging studies   CBC: Recent Labs  Lab 08/01/18 1844 08/01/18 1914 08/02/18 0319  WBC 11.2*  --  8.9  NEUTROABS 10.0*  --   --   HGB 12.1 13.3 11.4*  HCT 39.9 39.0 37.2  MCV 100.3*  --  100.3*  PLT 284  --  664   Basic Metabolic Panel: Recent Labs  Lab 08/01/18 1844 08/01/18 1914 08/02/18 0319  NA 144 145 146*  K 3.2* 3.2* 3.5  CL 111 112* 113*  CO2 23  --  23  GLUCOSE 111* 113* 98  BUN 18 21 16   CREATININE 1.09* 1.00 1.06*  CALCIUM 9.2  --  8.8*   GFR: Estimated Creatinine Clearance: 34.8 mL/min (A) (by C-G formula based on SCr of 1.06 mg/dL (H)). Liver Function Tests: Recent Labs  Lab 08/01/18 1844  AST 25  ALT 17  ALKPHOS 126  BILITOT 0.7  PROT 6.8  ALBUMIN 3.8   No results for input(s): LIPASE, AMYLASE in the last 168 hours. Recent Labs  Lab 08/01/18 1826  AMMONIA 18   Coagulation Profile: No results for input(s): INR, PROTIME in the last 168 hours. Cardiac Enzymes: No results for input(s): CKTOTAL, CKMB, CKMBINDEX, TROPONINI in the last 168 hours. BNP (last 3 results) No results for input(s): PROBNP in the last 8760 hours. HbA1C: No results for input(s): HGBA1C in the last 72 hours. CBG: No results for input(s): GLUCAP in the last 168 hours. Lipid Profile: No results for input(s): CHOL, HDL, LDLCALC, TRIG, CHOLHDL, LDLDIRECT in the last 72 hours. Thyroid Function Tests: No results for input(s): TSH, T4TOTAL, FREET4, T3FREE, THYROIDAB in the last 72 hours. Anemia Panel: No results for input(s): VITAMINB12, FOLATE, FERRITIN, TIBC, IRON, RETICCTPCT in the last 72  hours. Urine analysis:    Component Value Date/Time   COLORURINE STRAW (A) 08/01/2018 2322   APPEARANCEUR CLEAR 08/01/2018 2322   LABSPEC 1.010 08/01/2018 2322   PHURINE 5.0 08/01/2018 2322   GLUCOSEU NEGATIVE 08/01/2018 2322   HGBUR NEGATIVE 08/01/2018 2322   BILIRUBINUR NEGATIVE 08/01/2018 2322   KETONESUR 5 (A) 08/01/2018 2322   PROTEINUR NEGATIVE 08/01/2018 2322   UROBILINOGEN 0.2 07/28/2015 2223   NITRITE NEGATIVE 08/01/2018 2322   LEUKOCYTESUR NEGATIVE 08/01/2018 2322   Sepsis Labs: Invalid input(s): PROCALCITONIN, LACTICIDVEN  Recent Results (from the past 240 hour(s))  Blood Cultures (routine x 2)     Status: None (Preliminary result)   Collection Time: 08/01/18  7:40 PM  Result Value Ref Range Status   Specimen Description BLOOD BLOOD RIGHT FOREARM  Final   Special Requests   Final    BOTTLES DRAWN AEROBIC ONLY Blood Culture results may not be optimal due to an inadequate volume of  blood received in culture bottles   Culture   Final    NO GROWTH < 24 HOURS Performed at [facility], [place] [address], [place], [place] [address]    Report Status PENDING  Incomplete  Blood Cultures (routine x 2)     Status: None (Preliminary result)   Collection Time: 08/01/18  7:45 PM  Result Value Ref Range Status   Specimen Description BLOOD BLOOD LEFT FOREARM  Final   Special Requests   Final    BOTTLES DRAWN AEROBIC ONLY Blood Culture results may not be optimal due to an inadequate volume of blood received in culture bottles   Culture   Final    NO GROWTH < 24 HOURS Performed at [facility], [place] [address] [place], [place] [address]    Report Status PENDING  Incomplete  Urine culture     Status: None   Collection Time: 08/01/18 11:22 PM  Result Value Ref Range Status   Specimen Description URINE, CATHETERIZED  Final   Special Requests NONE  Final   Culture   Final    NO GROWTH Performed at [facility], [place] [address], [place], [place] [address]     Report Status 08/03/2018 FINAL  Final  MRSA PCR Screening     Status: Abnormal   Collection Time: 08/02/18 12:50 AM  Result Value Ref Range Status   MRSA by PCR POSITIVE (A) NEGATIVE Final    Comment:        The GeneXpert MRSA Assay (FDA approved for NASAL specimens only), is one component of a comprehensive MRSA colonization surveillance program. It is not intended to diagnose MRSA infection nor to guide or monitor treatment for MRSA infections. CRITICAL RESULT CALLED TO, READ BACK BY AND VERIFIED WITH: CALLED RN GLADYS @ 0322 ON 08/02/18 BY ROBINSON Z.        Radiology Studies: Dg Chest 2 View  Result Date: 08/01/2018 CLINICAL DATA:  Possible overdose. EXAM: CHEST - 2 VIEW COMPARISON:  05/14/2018 FINDINGS: The lungs are clear without focal pneumonia, edema, pneumothorax or pleural effusion. Interstitial markings are diffusely coarsened with chronic features. Basilar atelectasis or scarring noted. The cardiopericardial silhouette is within normal limits for size. Bones are diffusely demineralized. Telemetry leads overlie the chest. IMPRESSION: Basilar atelectasis or scarring. Otherwise no acute cardiopulmonary findings. Electronically Signed   By: Misty Stanley M.D.   On: 08/01/2018 19:36   Ct Head Wo Contrast  Result Date: 08/01/2018 CLINICAL DATA:  Possible overdose, lethargic EXAM: CT HEAD WITHOUT CONTRAST CT CERVICAL SPINE WITHOUT CONTRAST TECHNIQUE: Multidetector CT imaging of the head and cervical spine was performed following the standard protocol without intravenous contrast. Multiplanar CT image reconstructions of the cervical spine were also generated. COMPARISON:  MRI 10/12/2017, CT brain 10/09/2017 FINDINGS: CT HEAD FINDINGS Brain: No acute territorial infarction, hemorrhage or intracranial mass. Mild atrophy. Mild small vessel ischemic changes of the white matter. Stable ventricle size. Vascular: No hyperdense vessels.  Carotid vascular calcification Skull: Normal. Negative  for fracture or focal lesion. Sinuses/Orbits: Mucosal thickening in the right maxillary and ethmoid sinuses Other: Mild periorbital soft tissue swelling. CT CERVICAL SPINE FINDINGS Alignment: No subluxation.  Facet alignment within normal limits Skull base and vertebrae: No acute fracture. No primary bone lesion or focal pathologic process. Fluid in the left mastoid. Soft tissues and spinal canal: No prevertebral fluid or swelling. No visible canal hematoma. Disc levels: Mild to moderate degenerative changes C4-C5, C5-C6, C6-C7 and C7-T1. Multiple level bilateral facet  degenerative change. Upper chest: Lung apices demonstrate an 7 mm left upper lobe pulmonary nodule. There are postsurgical changes of the left carotid artery. Other: None IMPRESSION: 1. No CT evidence for acute intracranial abnormality. Atrophy and small vessel ischemic changes of the white matter 2. Degenerative changes of the cervical spine without acute osseous abnormality 3. Left mastoid effusion 4. 7 mm left apical pulmonary nodule. Dedicated chest CT may be performed for more thorough evaluation of the lung parenchyma, this could be performed on a nonemergent basis. Electronically Signed   By: Donavan Foil M.D.   On: 08/01/2018 20:48   Ct Cervical Spine Wo Contrast  Result Date: 08/01/2018 CLINICAL DATA:  Possible overdose, lethargic EXAM: CT HEAD WITHOUT CONTRAST CT CERVICAL SPINE WITHOUT CONTRAST TECHNIQUE: Multidetector CT imaging of the head and cervical spine was performed following the standard protocol without intravenous contrast. Multiplanar CT image reconstructions of the cervical spine were also generated. COMPARISON:  MRI 10/12/2017, CT brain 10/09/2017 FINDINGS: CT HEAD FINDINGS Brain: No acute territorial infarction, hemorrhage or intracranial mass. Mild atrophy. Mild small vessel ischemic changes of the white matter. Stable ventricle size. Vascular: No hyperdense vessels.  Carotid vascular calcification Skull: Normal.  Negative for fracture or focal lesion. Sinuses/Orbits: Mucosal thickening in the right maxillary and ethmoid sinuses Other: Mild periorbital soft tissue swelling. CT CERVICAL SPINE FINDINGS Alignment: No subluxation.  Facet alignment within normal limits Skull base and vertebrae: No acute fracture. No primary bone lesion or focal pathologic process. Fluid in the left mastoid. Soft tissues and spinal canal: No prevertebral fluid or swelling. No visible canal hematoma. Disc levels: Mild to moderate degenerative changes C4-C5, C5-C6, C6-C7 and C7-T1. Multiple level bilateral facet degenerative change. Upper chest: Lung apices demonstrate an 7 mm left upper lobe pulmonary nodule. There are postsurgical changes of the left carotid artery. Other: None IMPRESSION: 1. No CT evidence for acute intracranial abnormality. Atrophy and small vessel ischemic changes of the white matter 2. Degenerative changes of the cervical spine without acute osseous abnormality 3. Left mastoid effusion 4. 7 mm left apical pulmonary nodule. Dedicated chest CT may be performed for more thorough evaluation of the lung parenchyma, this could be performed on a nonemergent basis. Electronically Signed   By: Donavan Foil M.D.   On: 08/01/2018 20:48   Marzetta Board, MD, PhD [facility] Pager [phone]  If 7PM-7AM, please contact night-coverage [email or web address] Password [email or web address] 08/03/2018, 11:46 AM

## 2018-08-03 NOTE — NC FL2 (Signed)
[place] LEVEL OF CARE SCREENING TOOL     IDENTIFICATION  Patient Name: [name] Birthdate: [birth year] Sex: female Admission Date (Current Location): 08/01/2018  [facility] and [place] Number:  Herbalist and Address:  The [facility], [place] [address] [place], [place] [address]      Provider Number: [id number]  Attending Physician Name and Address:  Caren Griffins, MD  Relative Name and Phone Number:  Lianne Moris [phone]    Current Level of Care: Hospital Recommended Level of Care: [place] Prior Approval Number:    Date Approved/Denied:   PASRR Number:    Discharge Plan: SNF    Current Diagnoses: Patient Active Problem List   Diagnosis Date Noted  . Pulmonary nodule, left 08/02/2018  . Major depressive disorder, single episode, moderate ([place]) 08/02/2018  . Benzodiazepine overdose of undetermined intent 08/01/2018  . Cellulitis 05/14/2018  . Anemia 05/14/2018  . At risk for adverse drug event 10/17/2017  . Salicylate intoxication, undetermined intent, subsequent encounter 10/10/2017  . Acute renal failure superimposed on chronic kidney disease ([place]) 10/10/2017  . Headache 10/10/2017  . Protein-calorie malnutrition, severe 01/26/2016  . Opiate overdose ([place]) 01/25/2016  . Suicide attempt ([place]) 01/25/2016  . Depression   . Dyslipidemia   . Gastroesophageal reflux disease without esophagitis   . COPD (chronic obstructive pulmonary disease) ([place]) 07/29/2015  . Diabetes mellitus with neurological manifestation ([place]) 07/29/2015  . Frequent falls 07/29/2015  . HTN (hypertension) 07/29/2015  . AKI (acute kidney injury) ([place]) 07/29/2015  . SIRS (systemic inflammatory response syndrome) ([place]) 07/29/2015  . Spinal stenosis of lumbar region 04/23/2013  . Tremor due to multiple drugs 04/23/2013  . Tobacco use disorder 04/23/2013  . COPD exacerbation ([place]) 04/23/2013  . Occlusion and stenosis of  carotid artery without mention of cerebral infarction 03/12/2012  . Viral bronchitis-possible h. infl vs Norovirus 11/21/2011  . Pleuritic chest pain 09/18/2011    Orientation RESPIRATION BLADDER Height & Weight     Self  Normal Indwelling catheter Weight: 102 lb 15.3 oz (46.7 kg) Height:  5\' 4"  (162.6 cm)  BEHAVIORAL SYMPTOMS/MOOD NEUROLOGICAL BOWEL NUTRITION STATUS      Continent (Regular diet thin fluids)  AMBULATORY STATUS COMMUNICATION OF NEEDS Skin   Limited Assist Non-Verbally Normal                       Personal Care Assistance Level of Assistance  Bathing, Feeding, Dressing Bathing Assistance: Limited assistance Feeding assistance: Limited assistance Dressing Assistance: Limited assistance     Functional Limitations Info  Sight, Hearing, Speech Sight Info: Adequate Hearing Info: Adequate Speech Info: Impaired    SPECIAL CARE FACTORS FREQUENCY  PT (By licensed PT), OT (By licensed OT)     PT Frequency: 2x OT Frequency: 2x            Contractures      Additional Factors Info  Code Status(Full Code) Code Status Info: Full Code             Current Medications (08/03/2018):  This is the current hospital active medication list Current Facility-Administered Medications  Medication Dose Route Frequency Provider Last Rate Last Dose  . acetaminophen (TYLENOL) tablet 650 mg  650 mg Oral Q6H PRN [name], MD       Or  . acetaminophen (TYLENOL) suppository 650 mg  650 mg Rectal Q6H PRN [name], MD      . ALPRAZolam Duanne Moron)  tablet 0.5 mg  0.5 mg Oral BID PRN Caren Griffins, MD   0.5 mg at 08/02/18 2115  . amLODipine (NORVASC) tablet 10 mg  10 mg Oral Daily Caren Griffins, MD   10 mg at 08/03/18 1037  . aspirin EC tablet 81 mg  81 mg Oral Daily Caren Griffins, MD   81 mg at 08/03/18 1037  . atorvastatin (LIPITOR) tablet 20 mg  20 mg Oral QHS Caren Griffins, MD   20 mg at 08/02/18 2056  . Chlorhexidine Gluconate Cloth 2 % PADS 6  each  6 each Topical Q0600 Caren Griffins, MD   6 each at 08/03/18 1038  . enoxaparin (LOVENOX) injection 40 mg  40 mg Subcutaneous Q24H Zada Finders R, MD   40 mg at 08/03/18 1037  . feeding supplement (ENSURE ENLIVE) (ENSURE ENLIVE) liquid 237 mL  237 mL Oral BID BM Caren Griffins, MD   237 mL at 08/03/18 1038  . hydrALAZINE (APRESOLINE) injection 10 mg  10 mg Intravenous Q4H PRN Zada Finders R, MD      . metoprolol succinate (TOPROL-XL) 24 hr tablet 12.5 mg  12.5 mg Oral Daily Caren Griffins, MD   12.5 mg at 08/03/18 1038  . mirtazapine (REMERON) tablet 7.5 mg  7.5 mg Oral QHS Akintayo, Mojeed, MD   7.5 mg at 08/02/18 2057  . mupirocin ointment (BACTROBAN) 2 % 1 application  1 application Nasal BID Caren Griffins, MD   1 application at 85/69/43 1037  . nicotine (NICODERM CQ - dosed in mg/24 hours) patch 21 mg  21 mg Transdermal Daily Caren Griffins, MD   21 mg at 08/03/18 1037  . sodium chloride flush (NS) 0.9 % injection 3 mL  3 mL Intravenous Q12H [name], MD   3 mL at 08/03/18 1044     Discharge Medications: Please see discharge summary for a list of discharge medications.  Relevant Imaging Results:  Relevant Lab Results:   Additional Information [id number]  [facility], LCSW

## 2018-08-03 NOTE — Clinical Social Work Note (Signed)
Clinical Social Work Assessment  Patient Details  Name: [name] MRN: [id number] Date of Birth: 04-05-1946  Date of referral:  08/03/18               Reason for consult:  Discharge Planning                Permission sought to share information with:  Family Supports Permission granted to share information::  No  Name::        Agency::     Relationship::     Contact Information:     Housing/Transportation Living arrangements for the past 2 months:  Single Family Home Source of Information:  Guardian Patient Interpreter Needed:  None Criminal Activity/Legal Involvement Pertinent to Current Situation/Hospitalization:    Significant Relationships:    Lives with:  Friends Do you feel safe going back to the place where you live?  No Need for family participation in patient care:  Yes (Comment)  Care giving concerns:  CSW spoke with pt sister via phone conversation. Pt currently oriented to self. Pt sister relayed concerns for pt caring for self at home. Pt sister stated being only family member that can minimally assist with discharge decisions/plan. Pt's daughter uninvolved at this time. Pt sister agrees with PT/OT rec's and can be contacted to help with decision on placement. Pt sister also relayed pt does have Medicaid if LTC placement is needed. Pt sister awaits bed offers and will assist with discharge planning.    Social Worker assessment / plan:  CSW informed pt sister of medical team rec's. Pt sister agrees with plan and will assist in choosing facility.   Employment status:  Disabled (Comment on whether or not currently receiving Disability) Insurance information:  Managed Medicare PT Recommendations:   / Referral to community resources:  [place]  Patient/Family's Response to care:  Pt sister appears to understand and agreeable to plan of care.   Patient/Family's Understanding of and Emotional Response to Diagnosis,  Current Treatment, and Prognosis:    Emotional Assessment Appearance:  Other (Comment Required Attitude/Demeanor/Rapport:    Affect (typically observed):    Orientation:  Oriented to Self Alcohol / Substance use:  Not Applicable Psych involvement (Current and /or in the community):  Yes (Comment)  Discharge Needs  Concerns to be addressed:  Discharge Planning Concerns Readmission within the last 30 days:  Yes Current discharge risk:  Lack of support system Barriers to Discharge:  Continued Medical Work up   [employer], LCSW 08/03/2018, 1:56 PM

## 2018-08-04 DIAGNOSIS — I10 Essential (primary) hypertension: Secondary | ICD-10-CM | POA: Diagnosis not present

## 2018-08-04 DIAGNOSIS — T424X4D Poisoning by benzodiazepines, undetermined, subsequent encounter: Secondary | ICD-10-CM | POA: Diagnosis not present

## 2018-08-04 DIAGNOSIS — F321 Major depressive disorder, single episode, moderate: Secondary | ICD-10-CM | POA: Diagnosis not present

## 2018-08-04 DIAGNOSIS — T40604A Poisoning by unspecified narcotics, undetermined, initial encounter: Secondary | ICD-10-CM | POA: Diagnosis not present

## 2018-08-04 DIAGNOSIS — G9341 Metabolic encephalopathy: Secondary | ICD-10-CM | POA: Diagnosis not present

## 2018-08-04 DIAGNOSIS — R911 Solitary pulmonary nodule: Secondary | ICD-10-CM | POA: Diagnosis not present

## 2018-08-04 MED ORDER — ASPIRIN 81 MG PO TABS: 81.0000 mg | ORAL_TABLET | Freq: Every day | ORAL | Status: DC

## 2018-08-04 MED ORDER — BOOST / RESOURCE BREEZE PO LIQD CUSTOM: 1.0000 | Freq: Three times a day (TID) | ORAL | Status: DC

## 2018-08-04 MED ORDER — MIRTAZAPINE 7.5 MG PO TABS: 7.5000 mg | ORAL_TABLET | Freq: Every day | ORAL | 0 refills | Status: DC

## 2018-08-04 MED ORDER — NICOTINE 14 MG/24HR TD PT24: 14.0000 mg | MEDICATED_PATCH | Freq: Every day | TRANSDERMAL | 0 refills | Status: DC

## 2018-08-04 NOTE — Discharge Summary (Signed)
Physician Discharge Summary  [name] GMW:[id number] DOB: [birth year] DOA: 08/01/2018  PCP: [name], MD  Admit date: 08/01/2018 Discharge date: 08/04/2018  Admitted From: home Disposition:  Home (refuses SNF)  Recommendations for Outpatient Follow-up:  1. Follow up with PCP in 1-2 weeks 2. Please obtain BMP/CBC in one week  Home Health: PT, RN, aide, SW Equipment/Devices: none  Discharge Condition: stable CODE STATUS: Full code Diet recommendation: regular  HPI: Per Dr. Posey Pronto, [name] is a 72 y.o. female with medical history significant for COPD, CAD, CKD, history of intentional overdose who was brought by EMS for altered mental status.  Per ED provider, EMS noted Klonopin as potential overdose medication.  History is provided by chart review and ED provider as patient is very somnolent and inconsistently interactive. Initially patient was unresponsive to deep painful stimuli.  She was given Narcan with some improved response.  She is protecting her airway with intact gag reflex and moving all extremities.  Just prior to my examination she reportedly fell out of bed she was apparently trying to go to the bathroom.  She is placed in a neck collar but did not have an obvious injury. During my exam patient would answer yes/no questions.  She complained of abdominal pain and some diarrhea.  She denied taking extra medications at home.  She denied chest pain, dyspnea, fevers, dysuria.  Hospital Course: Acute metabolic encephalopathy likely due to benzodiazepine overuse -UDS was positive for benzodiazepines.  [place] controlled substance database reviewed and shows last prescription about 2 weeks ago for [employer].  She remained stable, was initially monitored in stepdown, she was able to protect her airway.  By the next morning, she was awake, and had no complaints.  She told me that she took only 3 pills so that she can fall asleep.  She denied any concomitant  substance injection, no alcohol or drugs.  She denies any suicidal ideation.  Due to unclear intent, psychiatry was consulted and evaluated patient, and she was cleared for discharge.  She was also started on Remeron to assist with appetite as well as sleep at night.  She tolerated this medication while inpatient, and will be prescribed upon discharge.  Physical therapy recommended SNF, patient was initially agreeable however later on she changed her mind and chose to go home instead.  We will maximize home health therapies on discharge. Depression /anxiety -resume home medications Hypertension -continue home medications Coronary artery disease -No chest pain Left apical pulmonary nodule seen on CT -This can be evaluated with dedicated chest CT on a nonemergent basis Tobacco abuse - strongly counseled for cessation  Discharge Diagnoses:  Principal Problem:   Major depressive disorder, single episode, moderate ([place]) Active Problems:   HTN (hypertension)   Benzodiazepine overdose of undetermined intent   Pulmonary nodule, left     Discharge Instructions   Allergies as of 08/04/2018   No Known Allergies     Medication List    STOP taking these medications   clonazePAM 1 MG tablet Commonly known as:  KLONOPIN   DULoxetine HCl 40 MG Cpep   ondansetron 8 MG tablet Commonly known as:  ZOFRAN   oxyCODONE-acetaminophen 10-325 MG tablet Commonly known as:  PERCOCET     TAKE these medications   allopurinol 300 MG tablet Commonly known as:  ZYLOPRIM Take 300 mg by mouth daily.   amLODipine 10 MG tablet Commonly known as:  NORVASC Take 1 tablet (10 mg total) by mouth daily.  aspirin 81 MG tablet Take 1 tablet (81 mg total) by mouth daily.   atorvastatin 20 MG tablet Commonly known as:  LIPITOR Take 20 mg by mouth at bedtime.   celecoxib 200 MG capsule Commonly known as:  CELEBREX Take 200 mg by mouth daily.   levorphanol 2 MG tablet Commonly known as:   LEVODROMORAN Take 2 mg by mouth 2 (two) times daily.   metoprolol succinate 25 MG 24 hr tablet Commonly known as:  TOPROL-XL Take 0.5 tablets (12.5 mg total) by mouth daily.   mirtazapine 7.5 MG tablet Commonly known as:  REMERON Take 1 tablet (7.5 mg total) by mouth at bedtime.   nicotine 14 mg/24hr patch Commonly known as:  NICODERM CQ - dosed in mg/24 hours Place 1 patch (14 mg total) onto the skin daily.      Follow-up Information    [name], MD. Schedule an appointment as soon as possible for a visit in 2 week(s).   Specialty:  Internal Medicine Contact information: [address] [place] [address] [facility] up.   Specialty:  Home Health Services Why:  [facility], HHPT, Social worker Contact information: [address] [place] [address] [phone]           Consultations:  Psychiatry  Procedures/Studies:  Dg Chest 2 View  Result Date: 08/01/2018 CLINICAL DATA:  Possible overdose. EXAM: CHEST - 2 VIEW COMPARISON:  05/14/2018 FINDINGS: The lungs are clear without focal pneumonia, edema, pneumothorax or pleural effusion. Interstitial markings are diffusely coarsened with chronic features. Basilar atelectasis or scarring noted. The cardiopericardial silhouette is within normal limits for size. Bones are diffusely demineralized. Telemetry leads overlie the chest. IMPRESSION: Basilar atelectasis or scarring. Otherwise no acute cardiopulmonary findings. Electronically Signed   By: Misty Stanley M.D.   On: 08/01/2018 19:36   Ct Head Wo Contrast  Result Date: 08/01/2018 CLINICAL DATA:  Possible overdose, lethargic EXAM: CT HEAD WITHOUT CONTRAST CT CERVICAL SPINE WITHOUT CONTRAST TECHNIQUE: Multidetector CT imaging of the head and cervical spine was performed following the standard protocol without intravenous contrast. Multiplanar CT image reconstructions of the cervical spine were  also generated. COMPARISON:  MRI 10/12/2017, CT brain 10/09/2017 FINDINGS: CT HEAD FINDINGS Brain: No acute territorial infarction, hemorrhage or intracranial mass. Mild atrophy. Mild small vessel ischemic changes of the white matter. Stable ventricle size. Vascular: No hyperdense vessels.  Carotid vascular calcification Skull: Normal. Negative for fracture or focal lesion. Sinuses/Orbits: Mucosal thickening in the right maxillary and ethmoid sinuses Other: Mild periorbital soft tissue swelling. CT CERVICAL SPINE FINDINGS Alignment: No subluxation.  Facet alignment within normal limits Skull base and vertebrae: No acute fracture. No primary bone lesion or focal pathologic process. Fluid in the left mastoid. Soft tissues and spinal canal: No prevertebral fluid or swelling. No visible canal hematoma. Disc levels: Mild to moderate degenerative changes C4-C5, C5-C6, C6-C7 and C7-T1. Multiple level bilateral facet degenerative change. Upper chest: Lung apices demonstrate an 7 mm left upper lobe pulmonary nodule. There are postsurgical changes of the left carotid artery. Other: None IMPRESSION: 1. No CT evidence for acute intracranial abnormality. Atrophy and small vessel ischemic changes of the white matter 2. Degenerative changes of the cervical spine without acute osseous abnormality 3. Left mastoid effusion 4. 7 mm left apical pulmonary nodule. Dedicated chest CT may be performed for more thorough evaluation of the lung parenchyma, this could be performed on a nonemergent basis. Electronically  Signed   By: Donavan Foil M.D.   On: 08/01/2018 20:48   Ct Cervical Spine Wo Contrast  Result Date: 08/01/2018 CLINICAL DATA:  Possible overdose, lethargic EXAM: CT HEAD WITHOUT CONTRAST CT CERVICAL SPINE WITHOUT CONTRAST TECHNIQUE: Multidetector CT imaging of the head and cervical spine was performed following the standard protocol without intravenous contrast. Multiplanar CT image reconstructions of the cervical spine  were also generated. COMPARISON:  MRI 10/12/2017, CT brain 10/09/2017 FINDINGS: CT HEAD FINDINGS Brain: No acute territorial infarction, hemorrhage or intracranial mass. Mild atrophy. Mild small vessel ischemic changes of the white matter. Stable ventricle size. Vascular: No hyperdense vessels.  Carotid vascular calcification Skull: Normal. Negative for fracture or focal lesion. Sinuses/Orbits: Mucosal thickening in the right maxillary and ethmoid sinuses Other: Mild periorbital soft tissue swelling. CT CERVICAL SPINE FINDINGS Alignment: No subluxation.  Facet alignment within normal limits Skull base and vertebrae: No acute fracture. No primary bone lesion or focal pathologic process. Fluid in the left mastoid. Soft tissues and spinal canal: No prevertebral fluid or swelling. No visible canal hematoma. Disc levels: Mild to moderate degenerative changes C4-C5, C5-C6, C6-C7 and C7-T1. Multiple level bilateral facet degenerative change. Upper chest: Lung apices demonstrate an 7 mm left upper lobe pulmonary nodule. There are postsurgical changes of the left carotid artery. Other: None IMPRESSION: 1. No CT evidence for acute intracranial abnormality. Atrophy and small vessel ischemic changes of the white matter 2. Degenerative changes of the cervical spine without acute osseous abnormality 3. Left mastoid effusion 4. 7 mm left apical pulmonary nodule. Dedicated chest CT may be performed for more thorough evaluation of the lung parenchyma, this could be performed on a nonemergent basis. Electronically Signed   By: Donavan Foil M.D.   On: 08/01/2018 20:48      Subjective: - no chest pain, shortness of breath, no abdominal pain, nausea or vomiting.   Discharge Exam: Vitals:   08/03/18 2100 08/04/18 0856  BP: (!) 153/75   Pulse: 79 83  Resp: (!) 22   Temp: 97.6 F (36.4 C) 97.6 F (36.4 C)  SpO2: 98% 100%    General: Pt is alert, awake, not in acute distress Cardiovascular: RRR, S1/S2 +, no rubs, no  gallops Respiratory: CTA bilaterally, no wheezing, no rhonchi Abdominal: Soft, NT, ND, bowel sounds + Extremities: no edema, no cyanosis    The results of significant diagnostics from this hospitalization (including imaging, microbiology, ancillary and laboratory) are listed below for reference.     Microbiology: Recent Results (from the past 240 hour(s))  Blood Cultures (routine x 2)     Status: None (Preliminary result)   Collection Time: 08/01/18  7:40 PM  Result Value Ref Range Status   Specimen Description BLOOD BLOOD RIGHT FOREARM  Final   Special Requests   Final    BOTTLES DRAWN AEROBIC ONLY Blood Culture results may not be optimal due to an inadequate volume of blood received in culture bottles   Culture   Final    NO GROWTH 3 DAYS Performed at [facility], [place] [address] [place], [place] [address]    Report Status PENDING  Incomplete  Blood Cultures (routine x 2)     Status: None (Preliminary result)   Collection Time: 08/01/18  7:45 PM  Result Value Ref Range Status   Specimen Description BLOOD BLOOD LEFT FOREARM  Final   Special Requests   Final    BOTTLES DRAWN AEROBIC ONLY Blood Culture results may not be optimal due to  an inadequate volume of blood received in culture bottles   Culture   Final    NO GROWTH 3 DAYS Performed at [facility], [place] [address] [place], [place] [address]    Report Status PENDING  Incomplete  Urine culture     Status: None   Collection Time: 08/01/18 11:22 PM  Result Value Ref Range Status   Specimen Description URINE, CATHETERIZED  Final   Special Requests NONE  Final   Culture   Final    NO GROWTH Performed at [facility], [place] [address], [place], [place] [address]    Report Status 08/03/2018 FINAL  Final  MRSA PCR Screening     Status: Abnormal   Collection Time: 08/02/18 12:50 AM  Result Value Ref Range Status   MRSA by PCR POSITIVE (A) NEGATIVE Final    Comment:        The GeneXpert MRSA Assay  (FDA approved for NASAL specimens only), is one component of a comprehensive MRSA colonization surveillance program. It is not intended to diagnose MRSA infection nor to guide or monitor treatment for MRSA infections. CRITICAL RESULT CALLED TO, READ BACK BY AND VERIFIED WITH: CALLED RN GLADYS @ 0322 ON 08/02/18 BY ROBINSON Z.       Labs: BNP (last 3 results) Recent Labs    10/17/17 1545 05/14/18 1940  BNP 96.2 347.4*   Basic Metabolic Panel: Recent Labs  Lab 08/01/18 1844 08/01/18 1914 08/02/18 0319  NA 144 145 146*  K 3.2* 3.2* 3.5  CL 111 112* 113*  CO2 23  --  23  GLUCOSE 111* 113* 98  BUN 18 21 16   CREATININE 1.09* 1.00 1.06*  CALCIUM 9.2  --  8.8*   Liver Function Tests: Recent Labs  Lab 08/01/18 1844  AST 25  ALT 17  ALKPHOS 126  BILITOT 0.7  PROT 6.8  ALBUMIN 3.8   No results for input(s): LIPASE, AMYLASE in the last 168 hours. Recent Labs  Lab 08/01/18 1826  AMMONIA 18   CBC: Recent Labs  Lab 08/01/18 1844 08/01/18 1914 08/02/18 0319  WBC 11.2*  --  8.9  NEUTROABS 10.0*  --   --   HGB 12.1 13.3 11.4*  HCT 39.9 39.0 37.2  MCV 100.3*  --  100.3*  PLT 284  --  286   Cardiac Enzymes: No results for input(s): CKTOTAL, CKMB, CKMBINDEX, TROPONINI in the last 168 hours. BNP: Invalid input(s): POCBNP CBG: No results for input(s): GLUCAP in the last 168 hours. D-Dimer No results for input(s): DDIMER in the last 72 hours. Hgb A1c No results for input(s): HGBA1C in the last 72 hours. Lipid Profile No results for input(s): CHOL, HDL, LDLCALC, TRIG, CHOLHDL, LDLDIRECT in the last 72 hours. Thyroid function studies No results for input(s): TSH, T4TOTAL, T3FREE, THYROIDAB in the last 72 hours.  Invalid input(s): FREET3 Anemia work up No results for input(s): VITAMINB12, FOLATE, FERRITIN, TIBC, IRON, RETICCTPCT in the last 72 hours. Urinalysis    Component Value Date/Time   COLORURINE STRAW (A) 08/01/2018 2322   APPEARANCEUR CLEAR  08/01/2018 2322   LABSPEC 1.010 08/01/2018 2322   PHURINE 5.0 08/01/2018 2322   GLUCOSEU NEGATIVE 08/01/2018 2322   HGBUR NEGATIVE 08/01/2018 2322   BILIRUBINUR NEGATIVE 08/01/2018 2322   KETONESUR 5 (A) 08/01/2018 2322   PROTEINUR NEGATIVE 08/01/2018 2322   UROBILINOGEN 0.2 07/28/2015 2223   NITRITE NEGATIVE 08/01/2018 2322   LEUKOCYTESUR NEGATIVE 08/01/2018 2322   Sepsis Labs Invalid input(s): PROCALCITONIN,  WBC,  LACTICIDVEN   Time coordinating discharge: 35 minutes  SIGNED:  Marzetta Board, MD  [facility] 08/04/2018, 3:25 PM Pager [phone]  If 7PM-7AM, please contact night-coverage [email or web address] Password [email or web address]

## 2018-08-04 NOTE — Progress Notes (Signed)
Initial Nutrition Assessment  DOCUMENTATION CODES:   Severe malnutrition in context of chronic illness, Underweight  INTERVENTION:    Boost Breeze po TID, each supplement provides 250 kcal and 9 grams of protein  NUTRITION DIAGNOSIS:   Severe Malnutrition related to chronic illness(COPD, CKD, CAD, Grave's Dz) as evidenced by severe fat depletion, severe muscle depletion  GOAL:   Patient will meet greater than or equal to 90% of their needs  MONITOR:   PO intake, Supplement acceptance, Labs, Skin, Weight trends, I & O's  REASON FOR ASSESSMENT:   Malnutrition Screening Tool  ASSESSMENT:   72 yo Female with history of COPD, CAD, CKD stage 3, history of intentional overdose who was brought by EMS for altered mental status.  Initial patient was unresponsive to deep painful stimuli, did receive Narcan with some response.  She was admitted to the hospital on hospital service as there was no concern for airway protection  Patient irritated upon RD visit today. She is trying to call out from her room phone. RD assisted. Briefly spoke patient at bedside as person she was trying to reach was unavailable. Patient reports her appetite is "okay;" states she did eat her breakfast.  No % PO intake available per flowsheets. Per RN, pt consumed 50% this AM. She reveals she doesn't like milk and therefore doesn't like Ensure and/or Boost. Amenable to trying Boost Breeze supplement (fruit flavor).  Labs and medications reviewed.  NUTRITION - FOCUSED PHYSICAL EXAM:    Most Recent Value  Orbital Region  Moderate depletion  Upper Arm Region  Severe depletion  Thoracic and Lumbar Region  Severe depletion  Buccal Region  Moderate depletion  Temple Region  Severe depletion  Clavicle Bone Region  Severe depletion  Clavicle and Acromion Bone Region  Severe depletion  Scapular Bone Region  Unable to assess  Dorsal Hand  Moderate depletion  Patellar Region  Severe depletion  Anterior Thigh  Region  Severe depletion  Posterior Calf Region  Severe depletion  Edema (RD Assessment)  None     Diet Order:   Diet Order            Diet regular Room service appropriate? Yes; Fluid consistency: Thin  Diet effective now             EDUCATION NEEDS:   No education needs have been identified at this time  Skin:  Skin Assessment: Reviewed RN Assessment  Last BM:  10/7  Height:   Ht Readings from Last 1 Encounters:  08/02/18 5\' 4"  (1.626 m)   Weight:   Wt Readings from Last 1 Encounters:  08/02/18 46.7 kg   BMI:  Body mass index is 17.67 kg/m.  Estimated Nutritional Needs:   Kcal:  1500-1700  Protein:  70-85 gm  Fluid:  1.5-1.7 L  Arthur Holms, RD, LDN Pager #: [phone] After-Hours Pager #: [phone]

## 2018-08-04 NOTE — Discharge Instructions (Signed)
Drug Overdose A drug overdose happens when you take too much of a drug. An overdose can occur with illegal drugs, prescription drugs, or over-the-counter (OTC) drugs. The effects of a drug overdose can be mild, dangerous, or even deadly. What are the causes? This condition may be caused by:  Taking too much of a drug by accident.  Taking too much of a drug on purpose.  An error made by a health care provider who prescribes a drug.  An error made by the pharmacist who fills the prescription order.  Drugs that commonly cause overdose include:  Mental health drugs.  Pain medicines.  Illegal drugs.  OTC cough and cold medicines.  Heart medicines.  Seizure medicines.  What increases the risk? A drug overdose is more likely in:  Children. They may be attracted to colorful pills. Because of a child's small size, even a small amount of a drug can be dangerous.  Elderly people. They may be taking many different drugs. Elderly people may have difficulty reading labels or remembering when they last took their medicine.  The risk of a drug overdose is also higher for someone who:  Takes illegal drugs.  Takes a drug and drinks alcohol.  Has a mental health condition.  What are the signs or symptoms? Symptoms of a drug overdose depend on the drug and the amount that was taken. Common danger signs include:  Behavior changes.  Sleepiness.  Slowed breathing.  Nausea and vomiting.  Seizures.  Changes in eye pupil size. The pupil may be very large or very small.  If there are signs and symptoms of very low blood pressure (shock) from an overdose, emergency treatment is required. These include:  Cold and clammy skin.  Pale skin.  Blue lips.  Very slow breathing.  Extreme sleepiness.  Loss of consciousness.  How is this diagnosed? This condition may be diagnosed based on your symptoms. It is important to tell your health care provider:  All of the drugs that  you took.  When you took the drugs.  Whether you were drinking alcohol.  Your health care provider will do a physical exam. This exam may include:  Checking and monitoring your heart rate and rhythm, your temperature, and your blood pressure (vital signs).  Checking your breathing and oxygen level.  You may also have tests, including:  Urine tests to check for drugs in your system.  Blood tests to check for: ? Drugs in your system. ? Signs of an imbalance of your blood minerals (electrolytes). ? Liver damage. ? Kidney damage.  How is this treated? Supporting your vital signs and your breathing is the first step in treating a drug overdose. Treatment may also include:  Giving fluids and electrolytes through an IV tube.  Inserting a breathing tube (endotracheal tube) in your airway to help you breathe.  Passing a tube through your nose and into your stomach (NG tube, or nasogastric tube) to wash out your stomach.  Giving medicines that: ? Make you vomit. ? Absorb any medicine that is left in your digestive system. ? Block or reverse the effect of the drug that caused the overdose.  Filtering your blood through an artificial kidney machine (hemodialysis). You may need this if your overdose is severe or if you have kidney failure.  Ongoing counseling and mental health support if you intentionally overdosed or used an illegal drug.  Follow these instructions at home:  Take medicines only as directed by your health care provider. Always  ask your health care provider about possible side effects of any new drug that you start taking.  Keep a list of all of the drugs that you take, including over-the-counter medicines. Bring this list with you to all of your medical visits.  Drink enough fluid to keep your urine clear or pale yellow.  Keep all follow-up visits as directed by your health care provider. This is important. How is this prevented?  Get help if you are struggling  with: ? Alcohol or drug use. ? Depression or another mental health problem.  Keep the phone number of your local poison control center near your phone or on your cell phone.  Store all medicines in safety containers that are out of the reach of children.  Read the drug inserts that come with your medicines.  Do not use illegal drugs.  Do not drink alcohol when taking drugs.  Do not take medicines that are not prescribed for you. Contact a health care provider if:  Your symptoms return.  You develop new symptoms or side effects when you take medicines. Get help right away if:  You think that you or someone else may have taken too much of a drug. The hotline of the [facility] is (510)646-9825.  You or someone else is having symptoms of a drug overdose.  You have serious thoughts about hurting yourself or others.  You become confused.  You have: ? Chest pain. ? Difficulty breathing. ? A loss of consciousness. Drug overdose is an emergency. Do not wait to see if the symptoms will go away. Get medical help right away. Call your local emergency services (911 in the [place] Do not drive yourself to the hospital. This information is not intended to replace advice given to you by your health care provider. Make sure you discuss any questions you have with your health care provider. Document Released: 03/01/2015 Document Revised: 02/24/2016 Document Reviewed: 10/20/2014 Elsevier Interactive Patient Education  2018 [employer]. Follow with [name], MD in 5-7 days  Please get a complete blood count and chemistry panel checked by your Primary MD at your next visit, and again as instructed by your Primary MD. Please get your medications reviewed and adjusted by your Primary MD.  Please request your Primary MD to go over all Hospital Tests and Procedure/Radiological results at the follow up, please get all Hospital records sent to your Prim MD by signing  hospital release before you go home.  If you had Pneumonia of Lung problems at the Hospital: Please get a 2 view Chest X ray done in 6-8 weeks after hospital discharge or sooner if instructed by your Primary MD.  If you have Congestive Heart Failure: Please call your Cardiologist or Primary MD anytime you have any of the following symptoms:  1) 3 pound weight gain in 24 hours or 5 pounds in 1 week  2) shortness of breath, with or without a dry hacking cough  3) swelling in the hands, feet or stomach  4) if you have to sleep on extra pillows at night in order to breathe  Follow cardiac low salt diet and 1.5 lit/day fluid restriction.  If you have diabetes Accuchecks 4 times/day, Once in AM empty stomach and then before each meal. Log in all results and show them to your primary doctor at your next visit. If any glucose reading is under 80 or above 300 call your primary MD immediately.  If you have Seizure/Convulsions/Epilepsy: Please do not  drive, operate heavy machinery, participate in activities at heights or participate in high speed sports until you have seen by Primary MD or a Neurologist and advised to do so again.  If you had Gastrointestinal Bleeding: Please ask your Primary MD to check a complete blood count within one week of discharge or at your next visit. Your endoscopic/colonoscopic biopsies that are pending at the time of discharge, will also need to followed by your Primary MD.  Get Medicines reviewed and adjusted. Please take all your medications with you for your next visit with your Primary MD  Please request your Primary MD to go over all hospital tests and procedure/radiological results at the follow up, please ask your Primary MD to get all Hospital records sent to his/her office.  If you experience worsening of your admission symptoms, develop shortness of breath, life threatening emergency, suicidal or homicidal thoughts you must seek medical attention immediately  by calling 911 or calling your MD immediately  if symptoms less severe.  You must read complete instructions/literature along with all the possible adverse reactions/side effects for all the Medicines you take and that have been prescribed to you. Take any new Medicines after you have completely understood and accpet all the possible adverse reactions/side effects.   Do not drive or operate heavy machinery when taking Pain medications.   Do not take more than prescribed Pain, Sleep and Anxiety Medications  Special Instructions: If you have smoked or chewed Tobacco  in the last 2 yrs please stop smoking, stop any regular Alcohol  and or any Recreational drug use.  Wear Seat belts while driving.  Please note You were cared for by a hospitalist during your hospital stay. If you have any questions about your discharge medications or the care you received while you were in the hospital after you are discharged, you can call the unit and asked to speak with the hospitalist on call if the hospitalist that took care of you is not available. Once you are discharged, your primary care physician will handle any further medical issues. Please note that NO REFILLS for any discharge medications will be authorized once you are discharged, as it is imperative that you return to your primary care physician (or establish a relationship with a primary care physician if you do not have one) for your aftercare needs so that they can reassess your need for medications and monitor your lab values.  You can reach the hospitalist office at phone [phone] or fax [phone]   If you do not have a primary care physician, you can call [phone] for a physician referral.  Activity: As tolerated with Full fall precautions use walker/cane & assistance as needed  Diet: regular  Disposition Home

## 2018-08-04 NOTE — Care Management Note (Signed)
Case Management Note  Patient Details  Name: [name] MRN: [id number] Date of Birth: 1946/07/25  Subjective/Objective:   From home alone, presents with Depressive d/o, htn, benzo od of undetermined intent, pulmonary nodule. Pt eval rec SNF, CSW aware.  she has an Environmental health practitioner from 1pm - 3 pm Mon- Friday.  10/7 Lynn Bamberger RN, BSN -  Patient has refused SNF, and would like to go home, NCM offered choice from [facility], she chose [facility] for [facility], for medication management, HHPT, and Social Work.  Referral givent to [facility] with [facility] , soc will begin 24-48 hrs post dc.                             Action/Plan: DC home when ready.   Expected Discharge Date:  08/04/18               Expected Discharge Plan:  [place]  In-House Referral:  Clinical Social Work  Discharge planning Services  CM Consult  Post Acute Care Choice:  Home Health Choice offered to:  Patient  DME Arranged:    DME Agency:     HH Arranged:  RN, PT, Social Work [employer] Agency:  [place]  Status of Service:  Completed, signed off  If discussed at [employer], dates discussed:    Additional Comments:  Zenon Mayo, RN 08/04/2018, 11:10 AM

## 2018-08-04 NOTE — Progress Notes (Signed)
Physical Therapy Treatment Patient Details Name: [name] MRN: [id number] DOB: 11/10/1945 Today's Date: 08/04/2018    History of Present Illness Pt is a 72 y.o. female with medical history significant for COPD, CAD, CKD, history of intentional overdose who was brought by EMS for altered mental status.  Per ED provider, EMS noted Klonopin as potential overdose medication.Initially patient was unresponsive to deep pressure, painful stimuli, did receive Narcan with some response.    PT Comments    Pt is adamant about going home to be with her dogs however continues to need assistance for safe mobility. Pt has poor safety awareness with mobility and requires minA for stability with transfers and ambulation of 40 feet with RW. Pt's personal aide is present during session and states she thinks pt needs increased assistance for pt to maintain safety. Pt continues to recommend SNF level therapy, however if pt continues to refuse, pt will need HHPT to improve balance and safety with mobility in her home environment.    Follow Up Recommendations  SNF     Equipment Recommendations  None recommended by PT    Recommendations for Other Services       Precautions / Restrictions Precautions Precautions: Fall Restrictions Weight Bearing Restrictions: No    Mobility  Bed Mobility Overal bed mobility: Needs Assistance Bed Mobility: Supine to Sit     Supine to sit: Supervision     General bed mobility comments: supervision for safety  Transfers Overall transfer level: Needs assistance Equipment used: Rolling walker (2 wheeled) Transfers: Sit to/from Stand Sit to Stand: Min assist         General transfer comment: minA for steadying after powerup to RW, maximal vc for reaching back and lowering slowly to chair.   Ambulation/Gait Ambulation/Gait assistance: Min assist Gait Distance (Feet): 40 Feet Assistive device: Rolling walker (2 wheeled) Gait Pattern/deviations:  Step-through pattern;Decreased stride length;Shuffle;Trunk flexed Gait velocity: slowed Gait velocity interpretation: <1.31 ft/sec, indicative of household ambulator General Gait Details: minA to steady with RW, 1xLoB, slow, shuffling gait with increased UE support, vc for keeping RW on floor and not trying to lift it up to turn around       Balance Overall balance assessment: Needs assistance Sitting-balance support: Feet supported;No upper extremity supported Sitting balance-Leahy Scale: Fair     Standing balance support: Bilateral upper extremity supported Standing balance-Leahy Scale: Poor Standing balance comment: requires RW support                            Cognition Arousal/Alertness: Awake/alert Behavior During Therapy: Anxious;Impulsive Overall Cognitive Status: History of cognitive impairments - at baseline                                           General Comments General comments (skin integrity, edema, etc.): VSS, pt continues to have difficulty keeping eyes open when ambulating, Pt aide present and states she is there 2.75 hrs/day 5day/wk, Aide is in contact with her agency to inquire about need for 7 day/wk services      Pertinent Vitals/Pain Pain Assessment: 0-10 Pain Score: 4  Pain Location: headache Pain Descriptors / Indicators: Headache Pain Intervention(s): Limited activity within patient's tolerance;Monitored during session;Patient requesting pain meds-RN notified    Home Living Family/patient expects to be discharged to:: Skilled nursing facility Living Arrangements: Non-relatives/Friends  Additional Comments: pt inconsistant in description of home living situation,    Prior Function Level of Independence: Needs assistance  Gait / Transfers Assistance Needed: ambulates with RW ADL's / Homemaking Assistance Needed: friend assists with some iADLs, has an aide, unclear on level of help  Comments: pt poor  historian, talks in circles, when first asked gets in shower and bathes herself, second time states she had aide who bathes her and she hasn't been in shower in months   PT Goals (current goals can now be found in the care plan section) Acute Rehab PT Goals Patient Stated Goal: none stated PT Goal Formulation: With patient Time For Goal Achievement: 08/16/18 Potential to Achieve Goals: Fair Progress towards PT goals: Progressing toward goals    Frequency    Min 2X/week      PT Plan Current plan remains appropriate       AM-PAC PT "6 Clicks" Daily Activity  Outcome Measure  Difficulty turning over in bed (including adjusting bedclothes, sheets and blankets)?: A Lot Difficulty moving from lying on back to sitting on the side of the bed? : Unable Difficulty sitting down on and standing up from a chair with arms (e.g., wheelchair, bedside commode, etc,.)?: Unable Help needed moving to and from a bed to chair (including a wheelchair)?: A Lot Help needed walking in hospital room?: A Lot Help needed climbing 3-5 steps with a railing? : Total 6 Click Score: 9    End of Session Equipment Utilized During Treatment: Gait belt Activity Tolerance: Patient limited by lethargy Patient left: in chair;with call bell/phone within reach;with chair alarm set;with nursing/sitter in room Nurse Communication: Mobility status;Precautions PT Visit Diagnosis: Unsteadiness on feet (R26.81);Other abnormalities of gait and mobility (R26.89);Muscle weakness (generalized) (M62.81);Difficulty in walking, not elsewhere classified (R26.2);History of falling (Z91.81)     Time: 6294-7654 PT Time Calculation (min) (ACUTE ONLY): 21 min  Charges:  $Gait Training: 8-22 mins                     Alister Staver B. Migdalia Dk PT, DPT Acute Rehabilitation Services Pager [phone] Office [phone]    [place] 08/04/2018, 12:53 PM

## 2018-08-04 NOTE — Progress Notes (Signed)
1215 pm Gave pt and caregiver Angie dc instructions. Went over medications and how pt was supposed to take them. I especially instructed pt not to take medicine that other people tell her to take but take only what the MD prescribed. Pt is very forgetful and asks the same questions over and over. Pt has difficulty getting up and down and walking. PT again told pt she should go to SNF and try to get rehab . Pt refused and said she had angie at home with her. Pt says she is able to take care of herself. Explained to caregiver and pt that she is a safety risk by going home. They both stated they understood and were willing to take responsibility.  Pt dc home in stable condition.

## 2018-08-04 NOTE — Progress Notes (Signed)
1800 talked with Lianne Moris sister and told her pt ;left medication in pharmacy. She stated she would be by tomorrow to pick up meds. Reported to Basile charge RN she would be by tomorrow.

## 2018-08-04 NOTE — Care Management Note (Signed)
Case Management Note  Patient Details  Name: [name] MRN: [id number] Date of Birth: [birth year]  Subjective/Objective:    From home alone, presents with Depressive d/o, htn, benzo od of undetermined intent, pulmonary nodule. Pt eval rec SNF, CSW aware.                 Action/Plan: NCM will follow for transition of care needs.   Expected Discharge Date:                  Expected Discharge Plan:  Skilled Nursing Facility  In-House Referral:  Clinical Social Work  Discharge planning Services  CM Consult  Post Acute Care Choice:    Choice offered to:     DME Arranged:    DME Agency:     HH Arranged:    [place] Agency:     Status of Service:  In process, will continue to follow  If discussed at Long Length of Stay Meetings, dates discussed:    Additional Comments:  Zenon Mayo, RN 08/04/2018, 9:44 AM

## 2018-08-06 DIAGNOSIS — M797 Fibromyalgia: Secondary | ICD-10-CM | POA: Diagnosis not present

## 2018-08-06 DIAGNOSIS — K219 Gastro-esophageal reflux disease without esophagitis: Secondary | ICD-10-CM | POA: Diagnosis not present

## 2018-08-06 DIAGNOSIS — I129 Hypertensive chronic kidney disease with stage 1 through stage 4 chronic kidney disease, or unspecified chronic kidney disease: Secondary | ICD-10-CM | POA: Diagnosis not present

## 2018-08-06 DIAGNOSIS — I6529 Occlusion and stenosis of unspecified carotid artery: Secondary | ICD-10-CM | POA: Diagnosis not present

## 2018-08-06 DIAGNOSIS — N189 Chronic kidney disease, unspecified: Secondary | ICD-10-CM | POA: Diagnosis not present

## 2018-08-06 DIAGNOSIS — K279 Peptic ulcer, site unspecified, unspecified as acute or chronic, without hemorrhage or perforation: Secondary | ICD-10-CM | POA: Diagnosis not present

## 2018-08-06 DIAGNOSIS — M50323 Other cervical disc degeneration at C6-C7 level: Secondary | ICD-10-CM | POA: Diagnosis not present

## 2018-08-06 DIAGNOSIS — R911 Solitary pulmonary nodule: Secondary | ICD-10-CM | POA: Diagnosis not present

## 2018-08-06 DIAGNOSIS — M5033 Other cervical disc degeneration, cervicothoracic region: Secondary | ICD-10-CM | POA: Diagnosis not present

## 2018-08-06 DIAGNOSIS — M50322 Other cervical disc degeneration at C5-C6 level: Secondary | ICD-10-CM | POA: Diagnosis not present

## 2018-08-06 DIAGNOSIS — J449 Chronic obstructive pulmonary disease, unspecified: Secondary | ICD-10-CM | POA: Diagnosis not present

## 2018-08-06 DIAGNOSIS — M810 Age-related osteoporosis without current pathological fracture: Secondary | ICD-10-CM | POA: Diagnosis not present

## 2018-08-06 DIAGNOSIS — Z86718 Personal history of other venous thrombosis and embolism: Secondary | ICD-10-CM | POA: Diagnosis not present

## 2018-08-06 DIAGNOSIS — I2510 Atherosclerotic heart disease of native coronary artery without angina pectoris: Secondary | ICD-10-CM | POA: Diagnosis not present

## 2018-08-06 DIAGNOSIS — M1990 Unspecified osteoarthritis, unspecified site: Secondary | ICD-10-CM | POA: Diagnosis not present

## 2018-08-06 DIAGNOSIS — M50321 Other cervical disc degeneration at C4-C5 level: Secondary | ICD-10-CM | POA: Diagnosis not present

## 2018-08-06 DIAGNOSIS — I739 Peripheral vascular disease, unspecified: Secondary | ICD-10-CM | POA: Diagnosis not present

## 2018-08-06 DIAGNOSIS — M48061 Spinal stenosis, lumbar region without neurogenic claudication: Secondary | ICD-10-CM | POA: Diagnosis not present

## 2018-08-06 DIAGNOSIS — G20 Parkinson's disease: Secondary | ICD-10-CM | POA: Diagnosis not present

## 2018-08-06 DIAGNOSIS — G8929 Other chronic pain: Secondary | ICD-10-CM | POA: Diagnosis not present

## 2018-08-06 DIAGNOSIS — Z79891 Long term (current) use of opiate analgesic: Secondary | ICD-10-CM | POA: Diagnosis not present

## 2018-08-06 LAB — CULTURE, BLOOD (ROUTINE X 2)
Culture: NO GROWTH
Culture: NO GROWTH

## 2018-08-07 DIAGNOSIS — E559 Vitamin D deficiency, unspecified: Secondary | ICD-10-CM | POA: Diagnosis not present

## 2018-08-07 DIAGNOSIS — M5416 Radiculopathy, lumbar region: Secondary | ICD-10-CM | POA: Diagnosis not present

## 2018-08-07 DIAGNOSIS — Z79899 Other long term (current) drug therapy: Secondary | ICD-10-CM | POA: Diagnosis not present

## 2018-08-07 DIAGNOSIS — G8929 Other chronic pain: Secondary | ICD-10-CM | POA: Diagnosis not present

## 2018-08-07 DIAGNOSIS — Z23 Encounter for immunization: Secondary | ICD-10-CM | POA: Diagnosis not present

## 2018-08-08 DIAGNOSIS — K279 Peptic ulcer, site unspecified, unspecified as acute or chronic, without hemorrhage or perforation: Secondary | ICD-10-CM | POA: Diagnosis not present

## 2018-08-08 DIAGNOSIS — K219 Gastro-esophageal reflux disease without esophagitis: Secondary | ICD-10-CM | POA: Diagnosis not present

## 2018-08-08 DIAGNOSIS — M50321 Other cervical disc degeneration at C4-C5 level: Secondary | ICD-10-CM | POA: Diagnosis not present

## 2018-08-08 DIAGNOSIS — R911 Solitary pulmonary nodule: Secondary | ICD-10-CM | POA: Diagnosis not present

## 2018-08-08 DIAGNOSIS — M50322 Other cervical disc degeneration at C5-C6 level: Secondary | ICD-10-CM | POA: Diagnosis not present

## 2018-08-08 DIAGNOSIS — I2510 Atherosclerotic heart disease of native coronary artery without angina pectoris: Secondary | ICD-10-CM | POA: Diagnosis not present

## 2018-08-08 DIAGNOSIS — Z79891 Long term (current) use of opiate analgesic: Secondary | ICD-10-CM | POA: Diagnosis not present

## 2018-08-08 DIAGNOSIS — Z86718 Personal history of other venous thrombosis and embolism: Secondary | ICD-10-CM | POA: Diagnosis not present

## 2018-08-08 DIAGNOSIS — M48061 Spinal stenosis, lumbar region without neurogenic claudication: Secondary | ICD-10-CM | POA: Diagnosis not present

## 2018-08-08 DIAGNOSIS — I6529 Occlusion and stenosis of unspecified carotid artery: Secondary | ICD-10-CM | POA: Diagnosis not present

## 2018-08-08 DIAGNOSIS — M50323 Other cervical disc degeneration at C6-C7 level: Secondary | ICD-10-CM | POA: Diagnosis not present

## 2018-08-08 DIAGNOSIS — N189 Chronic kidney disease, unspecified: Secondary | ICD-10-CM | POA: Diagnosis not present

## 2018-08-08 DIAGNOSIS — M810 Age-related osteoporosis without current pathological fracture: Secondary | ICD-10-CM | POA: Diagnosis not present

## 2018-08-08 DIAGNOSIS — I739 Peripheral vascular disease, unspecified: Secondary | ICD-10-CM | POA: Diagnosis not present

## 2018-08-08 DIAGNOSIS — G20 Parkinson's disease: Secondary | ICD-10-CM | POA: Diagnosis not present

## 2018-08-08 DIAGNOSIS — G8929 Other chronic pain: Secondary | ICD-10-CM | POA: Diagnosis not present

## 2018-08-08 DIAGNOSIS — M5033 Other cervical disc degeneration, cervicothoracic region: Secondary | ICD-10-CM | POA: Diagnosis not present

## 2018-08-08 DIAGNOSIS — M1990 Unspecified osteoarthritis, unspecified site: Secondary | ICD-10-CM | POA: Diagnosis not present

## 2018-08-08 DIAGNOSIS — M797 Fibromyalgia: Secondary | ICD-10-CM | POA: Diagnosis not present

## 2018-08-08 DIAGNOSIS — I129 Hypertensive chronic kidney disease with stage 1 through stage 4 chronic kidney disease, or unspecified chronic kidney disease: Secondary | ICD-10-CM | POA: Diagnosis not present

## 2018-08-08 DIAGNOSIS — J449 Chronic obstructive pulmonary disease, unspecified: Secondary | ICD-10-CM | POA: Diagnosis not present

## 2018-08-12 DIAGNOSIS — M48061 Spinal stenosis, lumbar region without neurogenic claudication: Secondary | ICD-10-CM | POA: Diagnosis not present

## 2018-08-12 DIAGNOSIS — M1990 Unspecified osteoarthritis, unspecified site: Secondary | ICD-10-CM | POA: Diagnosis not present

## 2018-08-12 DIAGNOSIS — M50323 Other cervical disc degeneration at C6-C7 level: Secondary | ICD-10-CM | POA: Diagnosis not present

## 2018-08-12 DIAGNOSIS — Z86718 Personal history of other venous thrombosis and embolism: Secondary | ICD-10-CM | POA: Diagnosis not present

## 2018-08-12 DIAGNOSIS — G8929 Other chronic pain: Secondary | ICD-10-CM | POA: Diagnosis not present

## 2018-08-12 DIAGNOSIS — M50321 Other cervical disc degeneration at C4-C5 level: Secondary | ICD-10-CM | POA: Diagnosis not present

## 2018-08-12 DIAGNOSIS — J449 Chronic obstructive pulmonary disease, unspecified: Secondary | ICD-10-CM | POA: Diagnosis not present

## 2018-08-12 DIAGNOSIS — G20 Parkinson's disease: Secondary | ICD-10-CM | POA: Diagnosis not present

## 2018-08-12 DIAGNOSIS — M810 Age-related osteoporosis without current pathological fracture: Secondary | ICD-10-CM | POA: Diagnosis not present

## 2018-08-12 DIAGNOSIS — K219 Gastro-esophageal reflux disease without esophagitis: Secondary | ICD-10-CM | POA: Diagnosis not present

## 2018-08-12 DIAGNOSIS — M50322 Other cervical disc degeneration at C5-C6 level: Secondary | ICD-10-CM | POA: Diagnosis not present

## 2018-08-12 DIAGNOSIS — K279 Peptic ulcer, site unspecified, unspecified as acute or chronic, without hemorrhage or perforation: Secondary | ICD-10-CM | POA: Diagnosis not present

## 2018-08-12 DIAGNOSIS — I6529 Occlusion and stenosis of unspecified carotid artery: Secondary | ICD-10-CM | POA: Diagnosis not present

## 2018-08-12 DIAGNOSIS — R911 Solitary pulmonary nodule: Secondary | ICD-10-CM | POA: Diagnosis not present

## 2018-08-12 DIAGNOSIS — N189 Chronic kidney disease, unspecified: Secondary | ICD-10-CM | POA: Diagnosis not present

## 2018-08-12 DIAGNOSIS — I739 Peripheral vascular disease, unspecified: Secondary | ICD-10-CM | POA: Diagnosis not present

## 2018-08-12 DIAGNOSIS — M797 Fibromyalgia: Secondary | ICD-10-CM | POA: Diagnosis not present

## 2018-08-12 DIAGNOSIS — Z79891 Long term (current) use of opiate analgesic: Secondary | ICD-10-CM | POA: Diagnosis not present

## 2018-08-12 DIAGNOSIS — M5033 Other cervical disc degeneration, cervicothoracic region: Secondary | ICD-10-CM | POA: Diagnosis not present

## 2018-08-12 DIAGNOSIS — I2510 Atherosclerotic heart disease of native coronary artery without angina pectoris: Secondary | ICD-10-CM | POA: Diagnosis not present

## 2018-08-12 DIAGNOSIS — I129 Hypertensive chronic kidney disease with stage 1 through stage 4 chronic kidney disease, or unspecified chronic kidney disease: Secondary | ICD-10-CM | POA: Diagnosis not present

## 2018-08-21 DIAGNOSIS — M50321 Other cervical disc degeneration at C4-C5 level: Secondary | ICD-10-CM | POA: Diagnosis not present

## 2018-08-21 DIAGNOSIS — I2510 Atherosclerotic heart disease of native coronary artery without angina pectoris: Secondary | ICD-10-CM | POA: Diagnosis not present

## 2018-08-21 DIAGNOSIS — G8929 Other chronic pain: Secondary | ICD-10-CM | POA: Diagnosis not present

## 2018-08-21 DIAGNOSIS — K219 Gastro-esophageal reflux disease without esophagitis: Secondary | ICD-10-CM | POA: Diagnosis not present

## 2018-08-21 DIAGNOSIS — Z79891 Long term (current) use of opiate analgesic: Secondary | ICD-10-CM | POA: Diagnosis not present

## 2018-08-21 DIAGNOSIS — G20 Parkinson's disease: Secondary | ICD-10-CM | POA: Diagnosis not present

## 2018-08-21 DIAGNOSIS — J449 Chronic obstructive pulmonary disease, unspecified: Secondary | ICD-10-CM | POA: Diagnosis not present

## 2018-08-21 DIAGNOSIS — M810 Age-related osteoporosis without current pathological fracture: Secondary | ICD-10-CM | POA: Diagnosis not present

## 2018-08-21 DIAGNOSIS — M5033 Other cervical disc degeneration, cervicothoracic region: Secondary | ICD-10-CM | POA: Diagnosis not present

## 2018-08-21 DIAGNOSIS — N189 Chronic kidney disease, unspecified: Secondary | ICD-10-CM | POA: Diagnosis not present

## 2018-08-21 DIAGNOSIS — M797 Fibromyalgia: Secondary | ICD-10-CM | POA: Diagnosis not present

## 2018-08-21 DIAGNOSIS — I129 Hypertensive chronic kidney disease with stage 1 through stage 4 chronic kidney disease, or unspecified chronic kidney disease: Secondary | ICD-10-CM | POA: Diagnosis not present

## 2018-08-21 DIAGNOSIS — R911 Solitary pulmonary nodule: Secondary | ICD-10-CM | POA: Diagnosis not present

## 2018-08-21 DIAGNOSIS — M50322 Other cervical disc degeneration at C5-C6 level: Secondary | ICD-10-CM | POA: Diagnosis not present

## 2018-08-21 DIAGNOSIS — K279 Peptic ulcer, site unspecified, unspecified as acute or chronic, without hemorrhage or perforation: Secondary | ICD-10-CM | POA: Diagnosis not present

## 2018-08-21 DIAGNOSIS — M1990 Unspecified osteoarthritis, unspecified site: Secondary | ICD-10-CM | POA: Diagnosis not present

## 2018-08-21 DIAGNOSIS — M48061 Spinal stenosis, lumbar region without neurogenic claudication: Secondary | ICD-10-CM | POA: Diagnosis not present

## 2018-08-21 DIAGNOSIS — M50323 Other cervical disc degeneration at C6-C7 level: Secondary | ICD-10-CM | POA: Diagnosis not present

## 2018-08-21 DIAGNOSIS — I6529 Occlusion and stenosis of unspecified carotid artery: Secondary | ICD-10-CM | POA: Diagnosis not present

## 2018-08-21 DIAGNOSIS — Z86718 Personal history of other venous thrombosis and embolism: Secondary | ICD-10-CM | POA: Diagnosis not present

## 2018-08-21 DIAGNOSIS — I739 Peripheral vascular disease, unspecified: Secondary | ICD-10-CM | POA: Diagnosis not present

## 2018-08-28 DIAGNOSIS — I2510 Atherosclerotic heart disease of native coronary artery without angina pectoris: Secondary | ICD-10-CM | POA: Diagnosis not present

## 2018-08-28 DIAGNOSIS — G8929 Other chronic pain: Secondary | ICD-10-CM | POA: Diagnosis not present

## 2018-08-28 DIAGNOSIS — I129 Hypertensive chronic kidney disease with stage 1 through stage 4 chronic kidney disease, or unspecified chronic kidney disease: Secondary | ICD-10-CM | POA: Diagnosis not present

## 2018-08-28 DIAGNOSIS — M50323 Other cervical disc degeneration at C6-C7 level: Secondary | ICD-10-CM | POA: Diagnosis not present

## 2018-08-28 DIAGNOSIS — I739 Peripheral vascular disease, unspecified: Secondary | ICD-10-CM | POA: Diagnosis not present

## 2018-08-28 DIAGNOSIS — M1990 Unspecified osteoarthritis, unspecified site: Secondary | ICD-10-CM | POA: Diagnosis not present

## 2018-08-28 DIAGNOSIS — M50321 Other cervical disc degeneration at C4-C5 level: Secondary | ICD-10-CM | POA: Diagnosis not present

## 2018-08-28 DIAGNOSIS — I6529 Occlusion and stenosis of unspecified carotid artery: Secondary | ICD-10-CM | POA: Diagnosis not present

## 2018-08-28 DIAGNOSIS — R911 Solitary pulmonary nodule: Secondary | ICD-10-CM | POA: Diagnosis not present

## 2018-08-28 DIAGNOSIS — M5033 Other cervical disc degeneration, cervicothoracic region: Secondary | ICD-10-CM | POA: Diagnosis not present

## 2018-08-28 DIAGNOSIS — N189 Chronic kidney disease, unspecified: Secondary | ICD-10-CM | POA: Diagnosis not present

## 2018-08-28 DIAGNOSIS — M48061 Spinal stenosis, lumbar region without neurogenic claudication: Secondary | ICD-10-CM | POA: Diagnosis not present

## 2018-08-28 DIAGNOSIS — M810 Age-related osteoporosis without current pathological fracture: Secondary | ICD-10-CM | POA: Diagnosis not present

## 2018-08-28 DIAGNOSIS — Z86718 Personal history of other venous thrombosis and embolism: Secondary | ICD-10-CM | POA: Diagnosis not present

## 2018-08-28 DIAGNOSIS — K279 Peptic ulcer, site unspecified, unspecified as acute or chronic, without hemorrhage or perforation: Secondary | ICD-10-CM | POA: Diagnosis not present

## 2018-08-28 DIAGNOSIS — G20 Parkinson's disease: Secondary | ICD-10-CM | POA: Diagnosis not present

## 2018-08-28 DIAGNOSIS — K219 Gastro-esophageal reflux disease without esophagitis: Secondary | ICD-10-CM | POA: Diagnosis not present

## 2018-08-28 DIAGNOSIS — M797 Fibromyalgia: Secondary | ICD-10-CM | POA: Diagnosis not present

## 2018-08-28 DIAGNOSIS — J449 Chronic obstructive pulmonary disease, unspecified: Secondary | ICD-10-CM | POA: Diagnosis not present

## 2018-08-28 DIAGNOSIS — Z79891 Long term (current) use of opiate analgesic: Secondary | ICD-10-CM | POA: Diagnosis not present

## 2018-08-28 DIAGNOSIS — M50322 Other cervical disc degeneration at C5-C6 level: Secondary | ICD-10-CM | POA: Diagnosis not present

## 2018-09-05 DIAGNOSIS — Z79899 Other long term (current) drug therapy: Secondary | ICD-10-CM | POA: Diagnosis not present

## 2018-09-05 DIAGNOSIS — E0590 Thyrotoxicosis, unspecified without thyrotoxic crisis or storm: Secondary | ICD-10-CM | POA: Diagnosis not present

## 2018-09-05 DIAGNOSIS — G8929 Other chronic pain: Secondary | ICD-10-CM | POA: Diagnosis not present

## 2018-09-05 DIAGNOSIS — M5416 Radiculopathy, lumbar region: Secondary | ICD-10-CM | POA: Diagnosis not present

## 2018-09-05 DIAGNOSIS — E7800 Pure hypercholesterolemia, unspecified: Secondary | ICD-10-CM | POA: Diagnosis not present

## 2018-09-05 DIAGNOSIS — I10 Essential (primary) hypertension: Secondary | ICD-10-CM | POA: Diagnosis not present

## 2018-10-02 DIAGNOSIS — N189 Chronic kidney disease, unspecified: Secondary | ICD-10-CM | POA: Diagnosis not present

## 2018-10-02 DIAGNOSIS — I129 Hypertensive chronic kidney disease with stage 1 through stage 4 chronic kidney disease, or unspecified chronic kidney disease: Secondary | ICD-10-CM | POA: Diagnosis not present

## 2018-10-02 DIAGNOSIS — R911 Solitary pulmonary nodule: Secondary | ICD-10-CM | POA: Diagnosis not present

## 2018-10-02 DIAGNOSIS — I2510 Atherosclerotic heart disease of native coronary artery without angina pectoris: Secondary | ICD-10-CM | POA: Diagnosis not present

## 2018-10-10 DIAGNOSIS — Z79899 Other long term (current) drug therapy: Secondary | ICD-10-CM | POA: Diagnosis not present

## 2018-10-10 DIAGNOSIS — M5416 Radiculopathy, lumbar region: Secondary | ICD-10-CM | POA: Diagnosis not present

## 2018-10-10 DIAGNOSIS — E7800 Pure hypercholesterolemia, unspecified: Secondary | ICD-10-CM | POA: Diagnosis not present

## 2018-10-10 DIAGNOSIS — G8929 Other chronic pain: Secondary | ICD-10-CM | POA: Diagnosis not present

## 2018-10-10 DIAGNOSIS — I10 Essential (primary) hypertension: Secondary | ICD-10-CM | POA: Diagnosis not present

## 2018-11-11 DIAGNOSIS — M5416 Radiculopathy, lumbar region: Secondary | ICD-10-CM | POA: Diagnosis not present

## 2018-11-11 DIAGNOSIS — I10 Essential (primary) hypertension: Secondary | ICD-10-CM | POA: Diagnosis not present

## 2018-11-11 DIAGNOSIS — Z79899 Other long term (current) drug therapy: Secondary | ICD-10-CM | POA: Diagnosis not present

## 2018-11-11 DIAGNOSIS — E7800 Pure hypercholesterolemia, unspecified: Secondary | ICD-10-CM | POA: Diagnosis not present

## 2018-11-11 DIAGNOSIS — G8929 Other chronic pain: Secondary | ICD-10-CM | POA: Diagnosis not present

## 2018-12-11 DIAGNOSIS — Z79899 Other long term (current) drug therapy: Secondary | ICD-10-CM | POA: Diagnosis not present

## 2018-12-11 DIAGNOSIS — M5416 Radiculopathy, lumbar region: Secondary | ICD-10-CM | POA: Diagnosis not present

## 2018-12-11 DIAGNOSIS — E7800 Pure hypercholesterolemia, unspecified: Secondary | ICD-10-CM | POA: Diagnosis not present

## 2018-12-11 DIAGNOSIS — G8929 Other chronic pain: Secondary | ICD-10-CM | POA: Diagnosis not present

## 2019-01-02 DIAGNOSIS — G8929 Other chronic pain: Secondary | ICD-10-CM | POA: Diagnosis not present

## 2019-01-02 DIAGNOSIS — E0590 Thyrotoxicosis, unspecified without thyrotoxic crisis or storm: Secondary | ICD-10-CM | POA: Diagnosis not present

## 2019-01-02 DIAGNOSIS — Z79899 Other long term (current) drug therapy: Secondary | ICD-10-CM | POA: Diagnosis not present

## 2019-01-02 DIAGNOSIS — M5416 Radiculopathy, lumbar region: Secondary | ICD-10-CM | POA: Diagnosis not present

## 2019-01-02 DIAGNOSIS — E7800 Pure hypercholesterolemia, unspecified: Secondary | ICD-10-CM | POA: Diagnosis not present

## 2019-02-04 DIAGNOSIS — M5416 Radiculopathy, lumbar region: Secondary | ICD-10-CM | POA: Diagnosis not present

## 2019-02-04 DIAGNOSIS — E7800 Pure hypercholesterolemia, unspecified: Secondary | ICD-10-CM | POA: Diagnosis not present

## 2019-02-04 DIAGNOSIS — G8929 Other chronic pain: Secondary | ICD-10-CM | POA: Diagnosis not present

## 2019-02-19 DIAGNOSIS — E7800 Pure hypercholesterolemia, unspecified: Secondary | ICD-10-CM | POA: Diagnosis not present

## 2019-02-19 DIAGNOSIS — Z79899 Other long term (current) drug therapy: Secondary | ICD-10-CM | POA: Diagnosis not present

## 2019-02-19 DIAGNOSIS — M5416 Radiculopathy, lumbar region: Secondary | ICD-10-CM | POA: Diagnosis not present

## 2019-02-19 DIAGNOSIS — G8929 Other chronic pain: Secondary | ICD-10-CM | POA: Diagnosis not present

## 2019-02-19 DIAGNOSIS — E0590 Thyrotoxicosis, unspecified without thyrotoxic crisis or storm: Secondary | ICD-10-CM | POA: Diagnosis not present

## 2019-03-24 DIAGNOSIS — I5032 Chronic diastolic (congestive) heart failure: Secondary | ICD-10-CM | POA: Diagnosis not present

## 2019-03-24 DIAGNOSIS — E785 Hyperlipidemia, unspecified: Secondary | ICD-10-CM | POA: Diagnosis not present

## 2019-03-24 DIAGNOSIS — E1122 Type 2 diabetes mellitus with diabetic chronic kidney disease: Secondary | ICD-10-CM | POA: Diagnosis not present

## 2019-03-24 DIAGNOSIS — N390 Urinary tract infection, site not specified: Secondary | ICD-10-CM | POA: Diagnosis not present

## 2019-03-24 DIAGNOSIS — E1142 Type 2 diabetes mellitus with diabetic polyneuropathy: Secondary | ICD-10-CM | POA: Diagnosis not present

## 2019-03-24 DIAGNOSIS — E538 Deficiency of other specified B group vitamins: Secondary | ICD-10-CM | POA: Diagnosis not present

## 2019-04-02 DIAGNOSIS — I5032 Chronic diastolic (congestive) heart failure: Secondary | ICD-10-CM | POA: Diagnosis not present

## 2019-04-02 DIAGNOSIS — I700 Atherosclerosis of aorta: Secondary | ICD-10-CM | POA: Diagnosis not present

## 2019-04-02 DIAGNOSIS — E0590 Thyrotoxicosis, unspecified without thyrotoxic crisis or storm: Secondary | ICD-10-CM | POA: Diagnosis not present

## 2019-04-02 DIAGNOSIS — Z0000 Encounter for general adult medical examination without abnormal findings: Secondary | ICD-10-CM | POA: Diagnosis not present

## 2019-04-02 DIAGNOSIS — E1122 Type 2 diabetes mellitus with diabetic chronic kidney disease: Secondary | ICD-10-CM | POA: Diagnosis not present

## 2019-04-02 DIAGNOSIS — E538 Deficiency of other specified B group vitamins: Secondary | ICD-10-CM | POA: Diagnosis not present

## 2019-04-02 DIAGNOSIS — J449 Chronic obstructive pulmonary disease, unspecified: Secondary | ICD-10-CM | POA: Diagnosis not present

## 2019-04-02 DIAGNOSIS — R0989 Other specified symptoms and signs involving the circulatory and respiratory systems: Secondary | ICD-10-CM | POA: Diagnosis not present

## 2019-04-02 DIAGNOSIS — E441 Mild protein-calorie malnutrition: Secondary | ICD-10-CM | POA: Diagnosis not present

## 2019-06-25 DIAGNOSIS — M129 Arthropathy, unspecified: Secondary | ICD-10-CM | POA: Diagnosis not present

## 2019-06-25 DIAGNOSIS — I10 Essential (primary) hypertension: Secondary | ICD-10-CM | POA: Diagnosis not present

## 2019-06-25 DIAGNOSIS — Z0000 Encounter for general adult medical examination without abnormal findings: Secondary | ICD-10-CM | POA: Diagnosis not present

## 2019-06-25 DIAGNOSIS — Z1339 Encounter for screening examination for other mental health and behavioral disorders: Secondary | ICD-10-CM | POA: Diagnosis not present

## 2019-06-25 DIAGNOSIS — Z1159 Encounter for screening for other viral diseases: Secondary | ICD-10-CM | POA: Diagnosis not present

## 2019-06-25 DIAGNOSIS — E559 Vitamin D deficiency, unspecified: Secondary | ICD-10-CM | POA: Diagnosis not present

## 2019-06-25 DIAGNOSIS — F17210 Nicotine dependence, cigarettes, uncomplicated: Secondary | ICD-10-CM | POA: Diagnosis not present

## 2019-06-25 DIAGNOSIS — M5416 Radiculopathy, lumbar region: Secondary | ICD-10-CM | POA: Diagnosis not present

## 2019-06-25 DIAGNOSIS — E0590 Thyrotoxicosis, unspecified without thyrotoxic crisis or storm: Secondary | ICD-10-CM | POA: Diagnosis not present

## 2019-06-25 DIAGNOSIS — E7800 Pure hypercholesterolemia, unspecified: Secondary | ICD-10-CM | POA: Diagnosis not present

## 2019-06-25 DIAGNOSIS — G8929 Other chronic pain: Secondary | ICD-10-CM | POA: Diagnosis not present

## 2019-06-25 DIAGNOSIS — Z131 Encounter for screening for diabetes mellitus: Secondary | ICD-10-CM | POA: Diagnosis not present

## 2019-06-25 DIAGNOSIS — Z79899 Other long term (current) drug therapy: Secondary | ICD-10-CM | POA: Diagnosis not present

## 2019-08-31 ENCOUNTER — Other Ambulatory Visit: Payer: Self-pay

## 2019-08-31 ENCOUNTER — Emergency Department (HOSPITAL_COMMUNITY)
Admission: EM | Admit: 2019-08-31 | Discharge: 2019-08-31 | Disposition: A | Discharge status: Home / Self Care | Payer: Medicare HMO | Attending: Emergency Medicine | Admitting: Emergency Medicine

## 2019-08-31 ENCOUNTER — Encounter (HOSPITAL_COMMUNITY): Payer: Self-pay

## 2019-08-31 DIAGNOSIS — F17210 Nicotine dependence, cigarettes, uncomplicated: Secondary | ICD-10-CM | POA: Diagnosis not present

## 2019-08-31 DIAGNOSIS — G20 Parkinson's disease: Secondary | ICD-10-CM | POA: Insufficient documentation

## 2019-08-31 DIAGNOSIS — S61432A Puncture wound without foreign body of left hand, initial encounter: Secondary | ICD-10-CM | POA: Diagnosis not present

## 2019-08-31 DIAGNOSIS — N189 Chronic kidney disease, unspecified: Secondary | ICD-10-CM | POA: Diagnosis not present

## 2019-08-31 DIAGNOSIS — Y998 Other external cause status: Secondary | ICD-10-CM | POA: Insufficient documentation

## 2019-08-31 DIAGNOSIS — E1122 Type 2 diabetes mellitus with diabetic chronic kidney disease: Secondary | ICD-10-CM | POA: Diagnosis not present

## 2019-08-31 DIAGNOSIS — Y929 Unspecified place or not applicable: Secondary | ICD-10-CM | POA: Diagnosis not present

## 2019-08-31 DIAGNOSIS — E1151 Type 2 diabetes mellitus with diabetic peripheral angiopathy without gangrene: Secondary | ICD-10-CM | POA: Diagnosis not present

## 2019-08-31 DIAGNOSIS — W5311XA Bitten by rat, initial encounter: Secondary | ICD-10-CM | POA: Insufficient documentation

## 2019-08-31 DIAGNOSIS — Z7982 Long term (current) use of aspirin: Secondary | ICD-10-CM | POA: Insufficient documentation

## 2019-08-31 DIAGNOSIS — S51831A Puncture wound without foreign body of right forearm, initial encounter: Secondary | ICD-10-CM | POA: Diagnosis not present

## 2019-08-31 DIAGNOSIS — Z23 Encounter for immunization: Secondary | ICD-10-CM | POA: Diagnosis not present

## 2019-08-31 DIAGNOSIS — I129 Hypertensive chronic kidney disease with stage 1 through stage 4 chronic kidney disease, or unspecified chronic kidney disease: Secondary | ICD-10-CM | POA: Diagnosis not present

## 2019-08-31 DIAGNOSIS — J449 Chronic obstructive pulmonary disease, unspecified: Secondary | ICD-10-CM | POA: Insufficient documentation

## 2019-08-31 DIAGNOSIS — Y9389 Activity, other specified: Secondary | ICD-10-CM | POA: Insufficient documentation

## 2019-08-31 DIAGNOSIS — Z79899 Other long term (current) drug therapy: Secondary | ICD-10-CM | POA: Diagnosis not present

## 2019-08-31 DIAGNOSIS — S61431A Puncture wound without foreign body of right hand, initial encounter: Secondary | ICD-10-CM | POA: Diagnosis not present

## 2019-08-31 MED ORDER — AMOXICILLIN-POT CLAVULANATE 875-125 MG PO TABS: 1.0000 | ORAL_TABLET | Freq: Two times a day (BID) | ORAL | 0 refills | Status: DC

## 2019-08-31 MED ORDER — CEFTRIAXONE SODIUM 1 G IJ SOLR
1.0000 g | Freq: Once | INTRAMUSCULAR | Status: AC
  Administered 2019-08-31: 1 g via INTRAMUSCULAR
  Filled 2019-08-31: qty 10

## 2019-08-31 MED ORDER — LIDOCAINE 5 % EX PTCH
2.0000 | MEDICATED_PATCH | CUTANEOUS | Status: DC
  Administered 2019-08-31: 06:00:00 2 via TRANSDERMAL
  Filled 2019-08-31: qty 2

## 2019-08-31 MED ORDER — TETANUS-DIPHTH-ACELL PERTUSSIS 5-2.5-18.5 LF-MCG/0.5 IM SUSP
0.5000 mL | Freq: Once | INTRAMUSCULAR | Status: AC
  Administered 2019-08-31: 06:00:00 0.5 mL via INTRAMUSCULAR
  Filled 2019-08-31: qty 0.5

## 2019-08-31 MED ORDER — BACITRACIN ZINC 500 UNIT/GM EX OINT
TOPICAL_OINTMENT | Freq: Two times a day (BID) | CUTANEOUS | Status: DC
  Administered 2019-08-31: 1 via TOPICAL
  Filled 2019-08-31: qty 3.6

## 2019-08-31 MED ORDER — STERILE WATER FOR INJECTION IJ SOLN
INTRAMUSCULAR | Status: AC
  Administered 2019-08-31: 06:00:00 2.1 mL
  Filled 2019-08-31: qty 10

## 2019-08-31 NOTE — ED Triage Notes (Signed)
Pt BIB [facility], with c/o rat bites to right hand and wrist. Pt states she woke up when a rat climbed up on her chest, that is when she noticed the multiple bite marks to her fingers and wrist on her right hand/wrist. Per EMS, they witnessed several large rats in the home. Pt denies pain to the bites, however, has chronic pain in her back and legs. Pt is A&O x4, ambulatory from EMS truck. Denies COVID contact, cough, SHOB or fever.

## 2019-08-31 NOTE — ED Provider Notes (Signed)
[facility] DEPT Provider Note   CSN: [id number] Arrival date & time: 08/31/19  5732     History   Chief Complaint Chief Complaint  Patient presents with  . Animal Bite    HPI [name] is a 73 y.o. female.     The history is provided by the patient.  Animal Bite Contact animal:  Rodent Location:  Hand and shoulder/arm Shoulder/arm injury location:  L fingers, L hand, R hand, R fingers and R forearm Time since incident:  2 hours Pain details:    Severity:  No pain   Progression:  Unchanged Incident location:  Home Provoked: unprovoked   Notifications:  None Animal's rabies vaccination status:  Never received Animal captured: in the home, but not pets.   Tetanus status:  Unknown Relieved by:  Nothing Worsened by:  Nothing Ineffective treatments:  None tried Associated symptoms: no fever, no numbness, no rash and no swelling   Patient presents for rat bites to B hands and R forearm 2 hours ago.  Patient awoke to find rats crawling on her.  She denies pain, numbness, fever, weakness, drainage.  Wounds are hemostatic.    Past Medical History:  Diagnosis Date  . Anxiety and depression   . Arthritis   . Asthma   . Carotid artery occlusion   . Chronic kidney disease   . Chronic pain    leg and feet  . COPD (chronic obstructive pulmonary disease) ([place])   . Coronary artery disease   . DDD (degenerative disc disease)   . Depression   . Diarrhea    chronic   . DVT (deep venous thrombosis) ([place])   . Fibromyalgia   . GERD (gastroesophageal reflux disease)   . Grave's disease   . Headache   . Hypertension   . OP (osteoporosis)   . Parkinson's disease ([place])   . Peripheral vascular disease ([place])   . PUD (peptic ulcer disease)   . Recurrent upper respiratory infection (URI)   . Rhinitis   . Sleep apnea   . Spinal stenosis of lumbar region 04/23/2013  . Thrombophlebitis   . Tobacco use disorder 04/23/2013  . Vitamin B 12  deficiency     Patient Active Problem List   Diagnosis Date Noted  . Pulmonary nodule, left 08/02/2018  . Major depressive disorder, single episode, moderate ([place]) 08/02/2018  . Benzodiazepine overdose of undetermined intent 08/01/2018  . Cellulitis 05/14/2018  . Anemia 05/14/2018  . At risk for adverse drug event 10/17/2017  . Salicylate intoxication, undetermined intent, subsequent encounter 10/10/2017  . Acute renal failure superimposed on chronic kidney disease ([place]) 10/10/2017  . Headache 10/10/2017  . Protein-calorie malnutrition, severe 01/26/2016  . Opiate overdose ([place]) 01/25/2016  . Suicide attempt ([place]) 01/25/2016  . Depression   . Dyslipidemia   . Gastroesophageal reflux disease without esophagitis   . COPD (chronic obstructive pulmonary disease) ([place]) 07/29/2015  . Diabetes mellitus with neurological manifestation ([place]) 07/29/2015  . Frequent falls 07/29/2015  . HTN (hypertension) 07/29/2015  . AKI (acute kidney injury) ([place]) 07/29/2015  . SIRS (systemic inflammatory response syndrome) ([place]) 07/29/2015  . Spinal stenosis of lumbar region 04/23/2013  . Tremor due to multiple drugs 04/23/2013  . Tobacco use disorder 04/23/2013  . COPD exacerbation ([place]) 04/23/2013  . Occlusion and stenosis of carotid artery without mention of cerebral infarction 03/12/2012  . Viral bronchitis-possible h. infl vs Norovirus 11/21/2011  . Pleuritic chest pain 09/18/2011    Past Surgical History:  Procedure Laterality Date  . CAROTID ENDARTERECTOMY Left Sept. 20,2011   cea  . CHOLECYSTECTOMY  1999  . GASTRECTOMY     age 52   Part of small intestin and part of stomach  . LEFT HEART CATHETERIZATION WITH CORONARY ANGIOGRAM N/A 10/02/2011   Procedure: LEFT HEART CATHETERIZATION WITH CORONARY ANGIOGRAM;  Surgeon: Sueanne Margarita, MD;  Location: [facility];  Service: Cardiovascular;  Laterality: N/A;     OB History   No obstetric history on file.      Home Medications    Prior to  Admission medications   Medication Sig Start Date End Date Taking? Authorizing Provider  allopurinol (ZYLOPRIM) 300 MG tablet Take 300 mg by mouth daily.    [provider]  amLODipine (NORVASC) 10 MG tablet Take 1 tablet (10 mg total) by mouth daily. 01/28/16   Thurnell Lose, MD  aspirin 81 MG tablet Take 1 tablet (81 mg total) by mouth daily. 08/04/18   Caren Griffins, MD  atorvastatin (LIPITOR) 20 MG tablet Take 20 mg by mouth at bedtime. 03/25/18   [provider]  celecoxib (CELEBREX) 200 MG capsule Take 200 mg by mouth daily. 05/29/18   [provider]  levorphanol (LEVODROMORAN) 2 MG tablet Take 2 mg by mouth 2 (two) times daily. 07/07/18   [provider]  metoprolol succinate (TOPROL XL) 25 MG 24 hr tablet Take 0.5 tablets (12.5 mg total) by mouth daily. 10/16/17 10/16/18  Nita Sells, MD  mirtazapine (REMERON) 7.5 MG tablet Take 1 tablet (7.5 mg total) by mouth at bedtime. 08/04/18   Caren Griffins, MD  nicotine (NICODERM CQ - DOSED IN MG/24 HOURS) 14 mg/24hr patch Place 1 patch (14 mg total) onto the skin daily. 08/04/18   Caren Griffins, MD    Family History Family History  Problem Relation Age of Onset  . Diabetes Mother   . Hyperlipidemia Mother   . Heart attack Mother   . Other Mother        varicose veins,respiratory,stroke  . Heart disease Mother        before age 62  . Hypertension Mother   . Varicose Veins Mother   . Diabetes Father   . Heart disease Father        before age 43  . Hyperlipidemia Father   . Heart attack Father   . Other Father        varicose veins  . Hypertension Father   . Varicose Veins Father   . Diabetes Sister   . Heart disease Sister        before age 45  . Hyperlipidemia Sister   . Heart attack Sister   . Other Sister        varicose veins  . Hypertension Sister   . Varicose Veins Sister   . Peripheral vascular disease Sister   . Diabetes Sister   . Hyperlipidemia Sister   .  Hypertension Sister   . Varicose Veins Sister     Social History Social History   Tobacco Use  . Smoking status: Current Every Day Smoker    Packs/day: 2.00    Years: 50.00    Pack years: 100.00    Types: Cigarettes  . Smokeless tobacco: Never Used  . Tobacco comment: cessation info given and reviewed  Substance Use Topics  . Alcohol use: No    Alcohol/week: 0.0 standard drinks  . Drug use: No     Allergies   Patient has  no known allergies.   Review of Systems Review of Systems  Constitutional: Negative for chills and fever.  HENT: Negative for congestion.   Eyes: Negative for visual disturbance.  Respiratory: Negative for cough and shortness of breath.   Cardiovascular: Negative for chest pain.  Gastrointestinal: Negative for abdominal pain.  Genitourinary: Negative for difficulty urinating.  Musculoskeletal: Negative for arthralgias.  Skin: Positive for wound. Negative for rash.  Neurological: Negative for dizziness and numbness.  Psychiatric/Behavioral: Negative for agitation.  All other systems reviewed and are negative.    Physical Exam Updated Vital Signs BP (!) 186/90   Pulse 79   Temp 98.1 F (36.7 C) (Oral)   Resp 20   Ht 5\' 4"  (1.626 m)   SpO2 100%   BMI 17.67 kg/m   Physical Exam Vitals signs and nursing note reviewed.  Constitutional:      General: She is not in acute distress.    Appearance: She is normal weight.  HENT:     Head: Normocephalic and atraumatic.     Nose: Nose normal.  Eyes:     Conjunctiva/sclera: Conjunctivae normal.     Pupils: Pupils are equal, round, and reactive to light.  Neck:     Musculoskeletal: Normal range of motion and neck supple.  Cardiovascular:     Rate and Rhythm: Normal rate and regular rhythm.     Pulses: Normal pulses.     Heart sounds: Normal heart sounds.  Pulmonary:     Effort: Pulmonary effort is normal.     Breath sounds: Normal breath sounds.  Abdominal:     General: Abdomen is flat.  Bowel sounds are normal.     Tenderness: There is no abdominal tenderness. There is no guarding.  Musculoskeletal:     Right forearm: She exhibits laceration.     Right hand: She exhibits laceration. She exhibits normal range of motion and normal capillary refill. Normal sensation noted. Normal strength noted.     Left hand: She exhibits decreased range of motion and laceration. She exhibits normal capillary refill. Normal sensation noted. Normal strength noted.  Skin:    General: Skin is warm and dry.     Capillary Refill: Capillary refill takes less than 2 seconds.     Findings: Wound present.          Comments: No warmth, no erythema nor fluctuance surrounding wounds.  All wounds hemostatic   Neurological:     General: No focal deficit present.     Mental Status: She is alert and oriented to person, place, and time.     Deep Tendon Reflexes: Reflexes normal.  Psychiatric:        Thought Content: Thought content normal.      ED Treatments / Results  Labs (all labs ordered are listed, but only abnormal results are displayed) Labs Reviewed - No data to display  EKG None  Radiology No results found.  Procedures Procedures (including critical care time)  Medications Ordered in ED Medications  Tdap (BOOSTRIX) injection 0.5 mL (has no administration in time range)  lidocaine (LIDODERM) 5 % 2 patch (has no administration in time range)  cefTRIAXone (ROCEPHIN) injection 1 g (has no administration in time range)    Extensive wound care in the ED.   Initial Impression / Assessment and Plan / ED Course   555 am case d/w Dr. Megan Salon of infectious disease. No need to give rabies vaccine and immunoglobulin.  Patient needs to do daily wound cleansing to prevent rat  fever and needs to return immediately for fevers, weakness etc.    Wound care instructions given for dial soap soaks with warm water twice daily and then neosporin twice daily to wounds.  Will have patient follow up  with hand surgery prn for signs of infection.  Strict return precautions given.  Augmentin for 7 days, take all antibiotics. Patient verbalizes understanding and agrees to follow up.   Final Clinical Impressions(s) / ED Diagnoses   Return for weakness, numbness, changes in vision or speech, fevers >100.4 unrelieved by medication, shortness of breath, intractable vomiting, or diarrhea, abdominal pain, Inability to tolerate liquids or food, cough, altered mental status or any concerns. No signs of systemic illness or infection. The patient is nontoxic-appearing on exam and vital signs are within normal limits.   I have reviewed the triage vital signs and the nursing notes. Pertinent labs &imaging results that were available during my care of the patient were reviewed by me and considered in my medical decision making (see chart for details).  After history, exam, and medical workup I feel the patient has been appropriately medically screened and is safe for discharge home. Pertinent diagnoses were discussed with the patient. Patient was given return precautions     Merilee Wible, MD 08/31/19 [phone]

## 2019-08-31 NOTE — Discharge Instructions (Addendum)
Dial soap soaks twice daily and then use neosporin twice daily on wounds following soaks

## 2019-11-04 ENCOUNTER — Emergency Department (HOSPITAL_COMMUNITY): Payer: Medicare Other

## 2019-11-04 ENCOUNTER — Observation Stay (HOSPITAL_COMMUNITY): Payer: Medicare Other

## 2019-11-04 ENCOUNTER — Encounter (HOSPITAL_COMMUNITY): Payer: Self-pay

## 2019-11-04 ENCOUNTER — Other Ambulatory Visit: Payer: Self-pay

## 2019-11-04 ENCOUNTER — Inpatient Hospital Stay (HOSPITAL_COMMUNITY)
Admission: EM | Admit: 2019-11-04 | Discharge: 2019-11-09 | DRG: 291 | Disposition: A | Discharge status: Home / Self Care | Payer: Medicare Other | Attending: Internal Medicine | Admitting: Internal Medicine

## 2019-11-04 DIAGNOSIS — J449 Chronic obstructive pulmonary disease, unspecified: Secondary | ICD-10-CM | POA: Diagnosis present

## 2019-11-04 DIAGNOSIS — Z66 Do not resuscitate: Secondary | ICD-10-CM | POA: Diagnosis present

## 2019-11-04 DIAGNOSIS — Z20822 Contact with and (suspected) exposure to covid-19: Secondary | ICD-10-CM | POA: Diagnosis present

## 2019-11-04 DIAGNOSIS — Z86718 Personal history of other venous thrombosis and embolism: Secondary | ICD-10-CM | POA: Diagnosis not present

## 2019-11-04 DIAGNOSIS — G894 Chronic pain syndrome: Secondary | ICD-10-CM | POA: Diagnosis present

## 2019-11-04 DIAGNOSIS — F419 Anxiety disorder, unspecified: Secondary | ICD-10-CM | POA: Diagnosis present

## 2019-11-04 DIAGNOSIS — Z8349 Family history of other endocrine, nutritional and metabolic diseases: Secondary | ICD-10-CM

## 2019-11-04 DIAGNOSIS — J9621 Acute and chronic respiratory failure with hypoxia: Secondary | ICD-10-CM | POA: Diagnosis present

## 2019-11-04 DIAGNOSIS — M797 Fibromyalgia: Secondary | ICD-10-CM | POA: Diagnosis present

## 2019-11-04 DIAGNOSIS — E0500 Thyrotoxicosis with diffuse goiter without thyrotoxic crisis or storm: Secondary | ICD-10-CM | POA: Diagnosis present

## 2019-11-04 DIAGNOSIS — I130 Hypertensive heart and chronic kidney disease with heart failure and stage 1 through stage 4 chronic kidney disease, or unspecified chronic kidney disease: Secondary | ICD-10-CM | POA: Diagnosis present

## 2019-11-04 DIAGNOSIS — N1831 Chronic kidney disease, stage 3a: Secondary | ICD-10-CM | POA: Diagnosis present

## 2019-11-04 DIAGNOSIS — N171 Acute kidney failure with acute cortical necrosis: Secondary | ICD-10-CM | POA: Diagnosis not present

## 2019-11-04 DIAGNOSIS — K529 Noninfective gastroenteritis and colitis, unspecified: Secondary | ICD-10-CM | POA: Diagnosis present

## 2019-11-04 DIAGNOSIS — Z823 Family history of stroke: Secondary | ICD-10-CM

## 2019-11-04 DIAGNOSIS — I2510 Atherosclerotic heart disease of native coronary artery without angina pectoris: Secondary | ICD-10-CM | POA: Diagnosis present

## 2019-11-04 DIAGNOSIS — G4733 Obstructive sleep apnea (adult) (pediatric): Secondary | ICD-10-CM | POA: Diagnosis present

## 2019-11-04 DIAGNOSIS — R7989 Other specified abnormal findings of blood chemistry: Secondary | ICD-10-CM | POA: Diagnosis not present

## 2019-11-04 DIAGNOSIS — J810 Acute pulmonary edema: Secondary | ICD-10-CM | POA: Diagnosis present

## 2019-11-04 DIAGNOSIS — E538 Deficiency of other specified B group vitamins: Secondary | ICD-10-CM | POA: Diagnosis present

## 2019-11-04 DIAGNOSIS — E1122 Type 2 diabetes mellitus with diabetic chronic kidney disease: Secondary | ICD-10-CM | POA: Diagnosis present

## 2019-11-04 DIAGNOSIS — E1149 Type 2 diabetes mellitus with other diabetic neurological complication: Secondary | ICD-10-CM | POA: Diagnosis present

## 2019-11-04 DIAGNOSIS — E1151 Type 2 diabetes mellitus with diabetic peripheral angiopathy without gangrene: Secondary | ICD-10-CM | POA: Diagnosis present

## 2019-11-04 DIAGNOSIS — Z8711 Personal history of peptic ulcer disease: Secondary | ICD-10-CM

## 2019-11-04 DIAGNOSIS — F329 Major depressive disorder, single episode, unspecified: Secondary | ICD-10-CM | POA: Diagnosis present

## 2019-11-04 DIAGNOSIS — I5030 Unspecified diastolic (congestive) heart failure: Secondary | ICD-10-CM

## 2019-11-04 DIAGNOSIS — G20 Parkinson's disease: Secondary | ICD-10-CM | POA: Diagnosis present

## 2019-11-04 DIAGNOSIS — M1990 Unspecified osteoarthritis, unspecified site: Secondary | ICD-10-CM | POA: Diagnosis present

## 2019-11-04 DIAGNOSIS — I10 Essential (primary) hypertension: Secondary | ICD-10-CM | POA: Diagnosis not present

## 2019-11-04 DIAGNOSIS — R0602 Shortness of breath: Secondary | ICD-10-CM

## 2019-11-04 DIAGNOSIS — F17200 Nicotine dependence, unspecified, uncomplicated: Secondary | ICD-10-CM | POA: Diagnosis present

## 2019-11-04 DIAGNOSIS — J9601 Acute respiratory failure with hypoxia: Secondary | ICD-10-CM | POA: Diagnosis present

## 2019-11-04 DIAGNOSIS — Z8672 Personal history of thrombophlebitis: Secondary | ICD-10-CM

## 2019-11-04 DIAGNOSIS — J441 Chronic obstructive pulmonary disease with (acute) exacerbation: Secondary | ICD-10-CM | POA: Diagnosis present

## 2019-11-04 DIAGNOSIS — F17210 Nicotine dependence, cigarettes, uncomplicated: Secondary | ICD-10-CM | POA: Diagnosis present

## 2019-11-04 DIAGNOSIS — I5033 Acute on chronic diastolic (congestive) heart failure: Secondary | ICD-10-CM | POA: Diagnosis present

## 2019-11-04 DIAGNOSIS — Z8249 Family history of ischemic heart disease and other diseases of the circulatory system: Secondary | ICD-10-CM

## 2019-11-04 DIAGNOSIS — I509 Heart failure, unspecified: Secondary | ICD-10-CM

## 2019-11-04 DIAGNOSIS — Z79899 Other long term (current) drug therapy: Secondary | ICD-10-CM

## 2019-11-04 DIAGNOSIS — E785 Hyperlipidemia, unspecified: Secondary | ICD-10-CM | POA: Diagnosis present

## 2019-11-04 DIAGNOSIS — Z833 Family history of diabetes mellitus: Secondary | ICD-10-CM

## 2019-11-04 DIAGNOSIS — Z9049 Acquired absence of other specified parts of digestive tract: Secondary | ICD-10-CM

## 2019-11-04 DIAGNOSIS — J9602 Acute respiratory failure with hypercapnia: Secondary | ICD-10-CM | POA: Diagnosis present

## 2019-11-04 DIAGNOSIS — N179 Acute kidney failure, unspecified: Secondary | ICD-10-CM | POA: Diagnosis present

## 2019-11-04 DIAGNOSIS — M810 Age-related osteoporosis without current pathological fracture: Secondary | ICD-10-CM | POA: Diagnosis present

## 2019-11-04 DIAGNOSIS — Z7982 Long term (current) use of aspirin: Secondary | ICD-10-CM

## 2019-11-04 DIAGNOSIS — N189 Chronic kidney disease, unspecified: Secondary | ICD-10-CM

## 2019-11-04 DIAGNOSIS — K219 Gastro-esophageal reflux disease without esophagitis: Secondary | ICD-10-CM | POA: Diagnosis present

## 2019-11-04 HISTORY — DX: Dyspnea, unspecified: R06.00

## 2019-11-04 LAB — CBC WITH DIFFERENTIAL/PLATELET
Abs Immature Granulocytes: 0.15 10*3/uL — ABNORMAL HIGH (ref 0.00–0.07)
Basophils Absolute: 0.1 10*3/uL (ref 0.0–0.1)
Basophils Relative: 0 %
Eosinophils Absolute: 0.1 10*3/uL (ref 0.0–0.5)
Eosinophils Relative: 0 %
HCT: 31.9 % — ABNORMAL LOW (ref 36.0–46.0)
Hemoglobin: 9.6 g/dL — ABNORMAL LOW (ref 12.0–15.0)
Immature Granulocytes: 1 %
Lymphocytes Relative: 6 %
Lymphs Abs: 1.4 10*3/uL (ref 0.7–4.0)
MCH: 28.3 pg (ref 26.0–34.0)
MCHC: 30.1 g/dL (ref 30.0–36.0)
MCV: 94.1 fL (ref 80.0–100.0)
Monocytes Absolute: 0.9 10*3/uL (ref 0.1–1.0)
Monocytes Relative: 4 %
Neutro Abs: 19 10*3/uL — ABNORMAL HIGH (ref 1.7–7.7)
Neutrophils Relative %: 89 %
Platelets: 305 10*3/uL (ref 150–400)
RBC: 3.39 MIL/uL — ABNORMAL LOW (ref 3.87–5.11)
RDW: 17.8 % — ABNORMAL HIGH (ref 11.5–15.5)
WBC: 21.5 10*3/uL — ABNORMAL HIGH (ref 4.0–10.5)
nRBC: 0 % (ref 0.0–0.2)

## 2019-11-04 LAB — POCT I-STAT 7, (LYTES, BLD GAS, ICA,H+H)
Acid-base deficit: 6 mmol/L — ABNORMAL HIGH (ref 0.0–2.0)
Bicarbonate: 21.5 mmol/L (ref 20.0–28.0)
Calcium, Ion: 1.18 mmol/L (ref 1.15–1.40)
HCT: 31 % — ABNORMAL LOW (ref 36.0–46.0)
Hemoglobin: 10.5 g/dL — ABNORMAL LOW (ref 12.0–15.0)
O2 Saturation: 99 %
Patient temperature: 97.1
Potassium: 4.1 mmol/L (ref 3.5–5.1)
Sodium: 141 mmol/L (ref 135–145)
TCO2: 23 mmol/L (ref 22–32)
pCO2 arterial: 46.8 mmHg (ref 32.0–48.0)
pH, Arterial: 7.266 — ABNORMAL LOW (ref 7.350–7.450)
pO2, Arterial: 143 mmHg — ABNORMAL HIGH (ref 83.0–108.0)

## 2019-11-04 LAB — TROPONIN I (HIGH SENSITIVITY)
Troponin I (High Sensitivity): 38 ng/L — ABNORMAL HIGH (ref <18)
Troponin I (High Sensitivity): 78 ng/L — ABNORMAL HIGH (ref <18)

## 2019-11-04 LAB — URINALYSIS, ROUTINE W REFLEX MICROSCOPIC
Bilirubin Urine: NEGATIVE
Glucose, UA: NEGATIVE mg/dL
Hgb urine dipstick: NEGATIVE
Ketones, ur: NEGATIVE mg/dL
Nitrite: NEGATIVE
Protein, ur: NEGATIVE mg/dL
Specific Gravity, Urine: 1.01 (ref 1.005–1.030)
pH: 5 (ref 5.0–8.0)

## 2019-11-04 LAB — BASIC METABOLIC PANEL
Anion gap: 10 (ref 5–15)
BUN: 23 mg/dL (ref 8–23)
CO2: 21 mmol/L — ABNORMAL LOW (ref 22–32)
Calcium: 7.9 mg/dL — ABNORMAL LOW (ref 8.9–10.3)
Chloride: 110 mmol/L (ref 98–111)
Creatinine, Ser: 1.43 mg/dL — ABNORMAL HIGH (ref 0.44–1.00)
GFR calc Af Amer: 42 mL/min — ABNORMAL LOW (ref >60)
GFR calc non Af Amer: 36 mL/min — ABNORMAL LOW (ref >60)
Glucose, Bld: 145 mg/dL — ABNORMAL HIGH (ref 70–99)
Potassium: 4.1 mmol/L (ref 3.5–5.1)
Sodium: 141 mmol/L (ref 135–145)

## 2019-11-04 LAB — I-STAT CHEM 8, ED
BUN: 23 mg/dL (ref 8–23)
Calcium, Ion: 1.1 mmol/L — ABNORMAL LOW (ref 1.15–1.40)
Chloride: 110 mmol/L (ref 98–111)
Creatinine, Ser: 1.3 mg/dL — ABNORMAL HIGH (ref 0.44–1.00)
Glucose, Bld: 137 mg/dL — ABNORMAL HIGH (ref 70–99)
HCT: 31 % — ABNORMAL LOW (ref 36.0–46.0)
Hemoglobin: 10.5 g/dL — ABNORMAL LOW (ref 12.0–15.0)
Potassium: 4.1 mmol/L (ref 3.5–5.1)
Sodium: 141 mmol/L (ref 135–145)
TCO2: 23 mmol/L (ref 22–32)

## 2019-11-04 LAB — ECHOCARDIOGRAM COMPLETE

## 2019-11-04 LAB — TSH: TSH: 0.401 u[IU]/mL (ref 0.350–4.500)

## 2019-11-04 LAB — SARS CORONAVIRUS 2 (TAT 6-24 HRS): SARS Coronavirus 2: NEGATIVE

## 2019-11-04 LAB — CBG MONITORING, ED: Glucose-Capillary: 132 mg/dL — ABNORMAL HIGH (ref 70–99)

## 2019-11-04 LAB — BRAIN NATRIURETIC PEPTIDE: B Natriuretic Peptide: 1120.6 pg/mL — ABNORMAL HIGH (ref 0.0–100.0)

## 2019-11-04 MED ORDER — ALLOPURINOL 300 MG PO TABS
300.0000 mg | ORAL_TABLET | Freq: Every day | ORAL | Status: DC
  Administered 2019-11-04 – 2019-11-09 (×6): 300 mg via ORAL
  Filled 2019-11-04 (×2): qty 1
  Filled 2019-11-04: qty 3
  Filled 2019-11-04 (×4): qty 1

## 2019-11-04 MED ORDER — ATORVASTATIN CALCIUM 10 MG PO TABS
20.0000 mg | ORAL_TABLET | Freq: Every day | ORAL | Status: DC
  Administered 2019-11-04 – 2019-11-08 (×5): 20 mg via ORAL
  Filled 2019-11-04 (×5): qty 2

## 2019-11-04 MED ORDER — ENOXAPARIN SODIUM 40 MG/0.4ML ~~LOC~~ SOLN: 40.0000 mg | SUBCUTANEOUS | Status: DC

## 2019-11-04 MED ORDER — LEVORPHANOL TARTRATE 2 MG PO TABS: 2.0000 mg | ORAL_TABLET | Freq: Two times a day (BID) | ORAL | Status: DC

## 2019-11-04 MED ORDER — SODIUM CHLORIDE 0.9 % IV SOLN: 250.0000 mL | INTRAVENOUS | Status: DC | PRN

## 2019-11-04 MED ORDER — LISINOPRIL 5 MG PO TABS: 5.0000 mg | ORAL_TABLET | Freq: Every day | ORAL | Status: DC

## 2019-11-04 MED ORDER — METOPROLOL SUCCINATE ER 25 MG PO TB24
12.5000 mg | ORAL_TABLET | Freq: Every day | ORAL | Status: DC
  Administered 2019-11-04 – 2019-11-05 (×2): 12.5 mg via ORAL
  Filled 2019-11-04 (×2): qty 1

## 2019-11-04 MED ORDER — ENOXAPARIN SODIUM 30 MG/0.3ML ~~LOC~~ SOLN
30.0000 mg | SUBCUTANEOUS | Status: DC
  Administered 2019-11-04 – 2019-11-09 (×6): 30 mg via SUBCUTANEOUS
  Filled 2019-11-04 (×6): qty 0.3

## 2019-11-04 MED ORDER — METHYLPREDNISOLONE SODIUM SUCC 125 MG IJ SOLR
125.0000 mg | Freq: Once | INTRAMUSCULAR | Status: AC
  Administered 2019-11-04: 125 mg via INTRAVENOUS
  Filled 2019-11-04: qty 2

## 2019-11-04 MED ORDER — AMLODIPINE BESYLATE 10 MG PO TABS
10.0000 mg | ORAL_TABLET | Freq: Every day | ORAL | Status: DC
  Administered 2019-11-05 – 2019-11-09 (×5): 10 mg via ORAL
  Filled 2019-11-04 (×5): qty 1

## 2019-11-04 MED ORDER — PANTOPRAZOLE SODIUM 40 MG PO TBEC
40.0000 mg | DELAYED_RELEASE_TABLET | Freq: Two times a day (BID) | ORAL | Status: DC
  Administered 2019-11-04 – 2019-11-09 (×11): 40 mg via ORAL
  Filled 2019-11-04 (×11): qty 1

## 2019-11-04 MED ORDER — ACETAMINOPHEN 325 MG PO TABS
650.0000 mg | ORAL_TABLET | ORAL | Status: DC | PRN
  Administered 2019-11-05: 650 mg via ORAL
  Filled 2019-11-04 (×2): qty 2

## 2019-11-04 MED ORDER — DULOXETINE HCL 60 MG PO CPEP
60.0000 mg | ORAL_CAPSULE | Freq: Two times a day (BID) | ORAL | Status: DC
  Administered 2019-11-04 – 2019-11-09 (×11): 60 mg via ORAL
  Filled 2019-11-04 (×13): qty 1

## 2019-11-04 MED ORDER — MAGNESIUM SULFATE 2 GM/50ML IV SOLN
2.0000 g | Freq: Once | INTRAVENOUS | Status: AC
  Administered 2019-11-04: 2 g via INTRAVENOUS
  Filled 2019-11-04: qty 50

## 2019-11-04 MED ORDER — OXYCODONE-ACETAMINOPHEN 7.5-325 MG PO TABS
1.0000 | ORAL_TABLET | Freq: Four times a day (QID) | ORAL | Status: DC | PRN
  Administered 2019-11-04 – 2019-11-09 (×8): 1 via ORAL
  Filled 2019-11-04 (×10): qty 1

## 2019-11-04 MED ORDER — ADULT MULTIVITAMIN W/MINERALS CH
1.0000 | ORAL_TABLET | Freq: Every day | ORAL | Status: DC
  Administered 2019-11-04 – 2019-11-09 (×6): 1 via ORAL
  Filled 2019-11-04 (×6): qty 1

## 2019-11-04 MED ORDER — DULOXETINE HCL 20 MG PO CPEP
20.0000 mg | ORAL_CAPSULE | Freq: Every day | ORAL | Status: DC
  Administered 2019-11-04 – 2019-11-09 (×6): 20 mg via ORAL
  Filled 2019-11-04 (×7): qty 1

## 2019-11-04 MED ORDER — FUROSEMIDE 10 MG/ML IJ SOLN
40.0000 mg | Freq: Two times a day (BID) | INTRAMUSCULAR | Status: DC
  Administered 2019-11-04: 19:00:00 40 mg via INTRAVENOUS
  Filled 2019-11-04: qty 4

## 2019-11-04 MED ORDER — IPRATROPIUM BROMIDE 0.02 % IN SOLN
1.0000 mg | Freq: Once | RESPIRATORY_TRACT | Status: AC
  Administered 2019-11-04: 03:00:00 1 mg via RESPIRATORY_TRACT
  Filled 2019-11-04: qty 5

## 2019-11-04 MED ORDER — FUROSEMIDE 10 MG/ML IJ SOLN
40.0000 mg | Freq: Once | INTRAMUSCULAR | Status: AC
  Administered 2019-11-04: 40 mg via INTRAVENOUS
  Filled 2019-11-04: qty 4

## 2019-11-04 MED ORDER — MULTIVITAMINS PO CAPS: 1.0000 | ORAL_CAPSULE | Freq: Every day | ORAL | Status: DC

## 2019-11-04 MED ORDER — NICOTINE 14 MG/24HR TD PT24
14.0000 mg | MEDICATED_PATCH | Freq: Every day | TRANSDERMAL | Status: DC
  Administered 2019-11-04 – 2019-11-09 (×6): 14 mg via TRANSDERMAL
  Filled 2019-11-04 (×6): qty 1

## 2019-11-04 MED ORDER — SODIUM CHLORIDE 0.9 % IV SOLN
500.0000 mg | Freq: Once | INTRAVENOUS | Status: AC
  Administered 2019-11-04: 500 mg via INTRAVENOUS
  Filled 2019-11-04: qty 500

## 2019-11-04 MED ORDER — ALBUTEROL (5 MG/ML) CONTINUOUS INHALATION SOLN
15.0000 mg/h | INHALATION_SOLUTION | Freq: Once | RESPIRATORY_TRACT | Status: AC
  Administered 2019-11-04: 03:00:00 15 mg/h via RESPIRATORY_TRACT
  Filled 2019-11-04: qty 20

## 2019-11-04 MED ORDER — ASPIRIN 81 MG PO CHEW
81.0000 mg | CHEWABLE_TABLET | Freq: Every day | ORAL | Status: DC
  Administered 2019-11-04 – 2019-11-09 (×6): 81 mg via ORAL
  Filled 2019-11-04 (×6): qty 1

## 2019-11-04 MED ORDER — SODIUM CHLORIDE 0.9% FLUSH: 3.0000 mL | INTRAVENOUS | Status: DC | PRN

## 2019-11-04 MED ORDER — SODIUM CHLORIDE 0.9% FLUSH
3.0000 mL | Freq: Two times a day (BID) | INTRAVENOUS | Status: DC
  Administered 2019-11-04 – 2019-11-09 (×11): 3 mL via INTRAVENOUS

## 2019-11-04 MED ORDER — ONDANSETRON HCL 4 MG/2ML IJ SOLN: 4.0000 mg | Freq: Four times a day (QID) | INTRAMUSCULAR | Status: DC | PRN

## 2019-11-04 NOTE — ED Provider Notes (Signed)
CHIEF COMPLAINT: Shortness of breath  HPI: Patient is a 74 year old female with history of CAD, COPD not on home oxygen, continued tobacco use who presents to the emergency department sudden onset shortness of breath that woke her from sleep.  She states she went to bed feeling fine.  Has had some intermittent left-sided chest pain that she is unable to describe it is now gone.  No fevers or change in her chronic cough.  No lower extremity swelling or pain.  Called 911.  Found to be in respiratory distress with sats in the mid 80s.  Initially on nonrebreather and sats improved.  Then placed on CPAP.  Patient was extremely hypertensive with EMS.  They gave 1 nitroglycerin without significant improvement of her blood pressure.  ROS: See HPI Constitutional: no fever  Eyes: no drainage  ENT: no runny nose   Cardiovascular:   chest pain  Resp: SOB  GI: no vomiting GU: no dysuria Integumentary: no rash  Allergy: no hives  Musculoskeletal: no leg swelling  Neurological: no slurred speech ROS otherwise negative  PAST MEDICAL HISTORY/PAST SURGICAL HISTORY:  Past Medical History:  Diagnosis Date  . Anxiety and depression   . Arthritis   . Asthma   . Carotid artery occlusion   . Chronic kidney disease   . Chronic pain    leg and feet  . COPD (chronic obstructive pulmonary disease) ([place])   . Coronary artery disease   . DDD (degenerative disc disease)   . Depression   . Diarrhea    chronic   . DVT (deep venous thrombosis) ([place])   . Fibromyalgia   . GERD (gastroesophageal reflux disease)   . Grave's disease   . Headache   . Hypertension   . OP (osteoporosis)   . Parkinson's disease ([place])   . Peripheral vascular disease ([place])   . PUD (peptic ulcer disease)   . Recurrent upper respiratory infection (URI)   . Rhinitis   . Sleep apnea   . Spinal stenosis of lumbar region 04/23/2013  . Thrombophlebitis   . Tobacco use disorder 04/23/2013  . Vitamin B 12 deficiency     MEDICATIONS:   Prior to Admission medications   Medication Sig Start Date End Date Taking? Authorizing Provider  allopurinol (ZYLOPRIM) 300 MG tablet Take 300 mg by mouth daily.    [provider]  amLODipine (NORVASC) 10 MG tablet Take 1 tablet (10 mg total) by mouth daily. 01/28/16   Thurnell Lose, MD  amoxicillin-clavulanate (AUGMENTIN) 875-125 MG tablet Take 1 tablet by mouth 2 (two) times daily. One po bid x 7 days 08/31/19   Palumbo, April, MD  aspirin 81 MG tablet Take 1 tablet (81 mg total) by mouth daily. 08/04/18   Caren Griffins, MD  atorvastatin (LIPITOR) 20 MG tablet Take 20 mg by mouth at bedtime. 03/25/18   [provider]  celecoxib (CELEBREX) 200 MG capsule Take 200 mg by mouth daily. 05/29/18   [provider]  levorphanol (LEVODROMORAN) 2 MG tablet Take 2 mg by mouth 2 (two) times daily. 07/07/18   [provider]  metoprolol succinate (TOPROL XL) 25 MG 24 hr tablet Take 0.5 tablets (12.5 mg total) by mouth daily. 10/16/17 10/16/18  Nita Sells, MD  mirtazapine (REMERON) 7.5 MG tablet Take 1 tablet (7.5 mg total) by mouth at bedtime. 08/04/18   Caren Griffins, MD  nicotine (NICODERM CQ - DOSED IN MG/24 HOURS) 14 mg/24hr patch Place 1 patch (14 mg total) onto the  skin daily. 08/04/18   Caren Griffins, MD    ALLERGIES:  No Known Allergies  SOCIAL HISTORY:  Social History   Tobacco Use  . Smoking status: Current Every Day Smoker    Packs/day: 2.00    Years: 50.00    Pack years: 100.00    Types: Cigarettes  . Smokeless tobacco: Never Used  . Tobacco comment: cessation info given and reviewed  Substance Use Topics  . Alcohol use: No    Alcohol/week: 0.0 standard drinks    FAMILY HISTORY: Family History  Problem Relation Age of Onset  . Diabetes Mother   . Hyperlipidemia Mother   . Heart attack Mother   . Other Mother        varicose veins,respiratory,stroke  . Heart disease Mother        before age 75  . Hypertension Mother    . Varicose Veins Mother   . Diabetes Father   . Heart disease Father        before age 95  . Hyperlipidemia Father   . Heart attack Father   . Other Father        varicose veins  . Hypertension Father   . Varicose Veins Father   . Diabetes Sister   . Heart disease Sister        before age 63  . Hyperlipidemia Sister   . Heart attack Sister   . Other Sister        varicose veins  . Hypertension Sister   . Varicose Veins Sister   . Peripheral vascular disease Sister   . Diabetes Sister   . Hyperlipidemia Sister   . Hypertension Sister   . Varicose Veins Sister     EXAM: BP (!) 185/93   Pulse (!) 107   Temp (!) 97.1 F (36.2 C) (Axillary)   Resp (!) 29   SpO2 100%  CONSTITUTIONAL: Alert and oriented and responds appropriately to questions.  Thin, elderly, chronically ill-appearing, in respiratory distress HEAD: Normocephalic EYES: Conjunctivae clear, pupils appear equal, EOM appear intact ENT: normal nose; moist mucous membranes NECK: Supple, normal ROM CARD: Regular and tachycardic; S1 and S2 appreciated; no murmurs, no clicks, no rubs, no gallops RESP: Sats of 87% on nonrebreather.  Increased work of breathing.  Speaking 1-2 words at a time.  Presents in moderate to severe respiratory distress.  Diffuse inspiratory and expiratory wheezing.  No rhonchi or rales.  Diminished aeration. ABD/GI: Normal bowel sounds; non-distended; soft, non-tender, no rebound, no guarding, no peritoneal signs, no hepatosplenomegaly BACK:  The back appears normal EXT: Normal ROM in all joints; no deformity noted, no edema; no cyanosis SKIN: Normal color for age and race; warm; no rash on exposed skin NEURO: Moves all extremities equally PSYCH: The patient's mood and manner are appropriate.   MEDICAL DECISION MAKING: Patient here in respiratory distress.  Hypertensive.  Does not appear volume overloaded but differential includes flash pulmonary edema, COPD exacerbation, pneumonia,  pneumothorax, COVID-19, PE.  Will give Solu-Medrol, magnesium, albuterol, Atrovent.  She is in a negative pressure room.  Will obtain Covid swab and place patient on BiPAP.  Respiratory therapy at bedside on patient arrival.  Did have some chest pain earlier that is now gone.  Will obtain cardiac labs.  EKG shows no new ischemic change.  We will also obtain chest x-ray.  Anticipate admission.  Blood gas pending.  ED PROGRESS: Patient's blood gas shows a pH of 7.266 with normal CO2 of 46.8 and a bicarb  of 21.5.  She is improving clinically on BiPAP.  Her chest x-ray shows cardiomegaly with a small left pleural effusion and interstitial opacities suggestive of cardiogenic pulmonary edema.  Cardiac labs including BNP pending.  Will give Lasix after potassium has resulted.   Weaned off BiPAP and now on 4 L nasal cannula.   5:30 AM  Labs show leukocytosis of 21,000 but she is afebrile.  Chest x-ray shows no pneumonia.  Covid swab pending.  She is unable to tell me if she has been on steroids recently.  BNP is greater than 1100.  Troponin is 38.  Will discuss with medicine for admission.  Patient has significantly improved but is still minimally tachycardic, tachypneic here.  She is on 4 L nasal cannula currently.   5:43 AM Discussed patient's case with hospitalist, Dr. Alcario Drought.  I have recommended admission and patient (and family if present) agree with this plan. Admitting physician will place admission orders.   I reviewed all nursing notes, vitals, pertinent previous records and interpreted all EKGs, lab and urine results, imaging (as available).    Hospitalist does recommend obtaining blood cultures, urine and urine culture given leukocytosis of 21,000 with left shift.  Also recommends giving IV azithromycin given this leukocytosis and COPD exacerbation.    EKG Interpretation  Date/Time:  Wednesday November 04 2019 03:08:13 EST Ventricular Rate:  109 PR Interval:    QRS Duration: 90 QT  Interval:  329 QTC Calculation: 443 R Axis:   -19 Text Interpretation: Sinus tachycardia Probable left ventricular hypertrophy Nonspecific T abnormalities, lateral leads that appear new Anterior ST elevation, probably due to LVH Confirmed by Inocencia Murtaugh, Cyril Mourning [phone]) on 11/04/2019 3:41:04 AM        CRITICAL CARE Performed by: Cyril Mourning Jaryd Drew   Total critical care time: 55 minutes  Critical care time was exclusive of separately billable procedures and treating other patients.  Critical care was necessary to treat or prevent imminent or life-threatening deterioration.  Critical care was time spent personally by me on the following activities: development of treatment plan with patient and/or surrogate as well as nursing, discussions with consultants, evaluation of patient's response to treatment, examination of patient, obtaining history from patient or surrogate, ordering and performing treatments and interventions, ordering and review of laboratory studies, ordering and review of radiographic studies, pulse oximetry and re-evaluation of patient's condition.   [name] was evaluated in Emergency Department on 11/04/2019 for the symptoms described in the history of present illness. She was evaluated in the context of the global COVID-19 pandemic, which necessitated consideration that the patient might be at risk for infection with the SARS-CoV-2 virus that causes COVID-19. Institutional protocols and algorithms that pertain to the evaluation of patients at risk for COVID-19 are in a state of rapid change based on information released by regulatory bodies including the CDC and federal and state organizations. These policies and algorithms were followed during the patient's care in the ED.  Patient was seen wearing N95, face shield, gloves, gown.    Ann Groeneveld, Delice Bison, DO 11/04/19 [phone]

## 2019-11-04 NOTE — ED Notes (Signed)
PureWick placed.

## 2019-11-04 NOTE — H&P (Signed)
History and Physical    [name] DQQ:[id number] DOB: [birth year] DOA: 11/04/2019  PCP: [name], MD Consultants:  None Patient coming from:  Home - lives with a friend's son; NOK: Daughter, sister, or friend; [name], [phone]  Chief Complaint: SOB  HPI: [name] is a 74 y.o. female with medical history significant of OSA: PVD; Parkinson's; HTN; depression; CAD; COPD; grade 1 diastolic CHF (echo in 4818); chronic pain; and CKD presenting with SOB.  She woke up and felt like she couldn't get any air, like she was drowning.  She thought maybe going outside she could cool off, but she could barely walk because she was so SOB, felt like she was drowning.  She felt ok at bedtime.  No h/o prior.  She has chronic LE edema, seems to be a little worse.  No change to weight.  She didn't feel good yesterday or the day before, a little achy.  She stayed in her room and slept a lot.  She also thinks she was depressed, also - has had a lot of recent stressors - losing her home due to taxes.  She feels her usual SOB now - she thinks the oxygen and medication they gave her to make it better.  +tobacco.    ED Course: Carryover, per Dr. Alcario Drought:  74 yo F with COPD and CHF exacerbation. No h/o COPD but still smokes. Also looks like flash pulm edema with very elevated BPs. Breathing improved with BP meds. CXR diffuse edema.   Initially on BIPAP but now off BIPAP and on 4L.   COVID pending.   WBC 21k. No fever.  Review of Systems: As per HPI; otherwise review of systems reviewed and negative.   Ambulatory Status:  Ambulates with a cane at home, with "furniture walking"; uses a walker outside the home   Past Medical History:  Diagnosis Date  . Anxiety and depression   . Arthritis   . Asthma   . Carotid artery occlusion   . Chronic kidney disease   . Chronic pain    leg and feet  . COPD (chronic obstructive pulmonary disease) ([place])   . Coronary artery disease   .  DDD (degenerative disc disease)   . Diarrhea    chronic   . DVT (deep venous thrombosis) ([place])   . Fibromyalgia   . GERD (gastroesophageal reflux disease)   . Grave's disease   . Headache   . Hypertension   . OP (osteoporosis)   . Parkinson's disease ([place])   . Peripheral vascular disease ([place])   . PUD (peptic ulcer disease)   . Recurrent upper respiratory infection (URI)   . Rhinitis   . Sleep apnea   . Spinal stenosis of lumbar region 04/23/2013  . Thrombophlebitis   . Tobacco use disorder 04/23/2013  . Vitamin B 12 deficiency     Past Surgical History:  Procedure Laterality Date  . CAROTID ENDARTERECTOMY Left Sept. 20,2011   cea  . CHOLECYSTECTOMY  1999  . GASTRECTOMY     age 50   Part of small intestin and part of stomach  . LEFT HEART CATHETERIZATION WITH CORONARY ANGIOGRAM N/A 10/02/2011   Procedure: LEFT HEART CATHETERIZATION WITH CORONARY ANGIOGRAM;  Surgeon: Sueanne Margarita, MD;  Location: [facility];  Service: Cardiovascular;  Laterality: N/A;    Social History   Socioeconomic History  . Marital status: Widowed    Spouse name: Not on file  . Number of children: 1  .  Years of education: Not on file  . Highest education level: Not on file  Occupational History  . Occupation: retired  Tobacco Use  . Smoking status: Current Every Day Smoker    Packs/day: 2.00    Years: 50.00    Pack years: 100.00    Types: Cigarettes  . Smokeless tobacco: Never Used  . Tobacco comment: cessation info given and reviewed  Substance and Sexual Activity  . Alcohol use: No    Alcohol/week: 0.0 standard drinks  . Drug use: No  . Sexual activity: Never    Birth control/protection: Post-menopausal  Other Topics Concern  . Not on file  Social History Narrative  . Not on file   Social Determinants of Health   Financial Resource Strain:   . Difficulty of Paying Living Expenses: Not on file  Food Insecurity:   . Worried About Charity fundraiser in the Last Year: Not on file    . Ran Out of Food in the Last Year: Not on file  Transportation Needs:   . Lack of Transportation (Medical): Not on file  . Lack of Transportation (Non-Medical): Not on file  Physical Activity:   . Days of Exercise per Week: Not on file  . Minutes of Exercise per Session: Not on file  Stress:   . Feeling of Stress : Not on file  Social Connections:   . Frequency of Communication with Friends and Family: Not on file  . Frequency of Social Gatherings with Friends and Family: Not on file  . Attends Religious Services: Not on file  . Active Member of Clubs or Organizations: Not on file  . Attends Archivist Meetings: Not on file  . Marital Status: Not on file  Intimate Partner Violence:   . Fear of Current or Ex-Partner: Not on file  . Emotionally Abused: Not on file  . Physically Abused: Not on file  . Sexually Abused: Not on file    No Known Allergies  Family History  Problem Relation Age of Onset  . Diabetes Mother   . Hyperlipidemia Mother   . Heart attack Mother   . Other Mother        varicose veins,respiratory,stroke  . Heart disease Mother        before age 37  . Hypertension Mother   . Varicose Veins Mother   . Diabetes Father   . Heart disease Father        before age 69  . Hyperlipidemia Father   . Heart attack Father   . Other Father        varicose veins  . Hypertension Father   . Varicose Veins Father   . Diabetes Sister   . Heart disease Sister        before age 8  . Hyperlipidemia Sister   . Heart attack Sister   . Other Sister        varicose veins  . Hypertension Sister   . Varicose Veins Sister   . Peripheral vascular disease Sister   . Diabetes Sister   . Hyperlipidemia Sister   . Hypertension Sister   . Varicose Veins Sister     Prior to Admission medications   Medication Sig Start Date End Date Taking? Authorizing Provider  allopurinol (ZYLOPRIM) 300 MG tablet Take 300 mg by mouth daily.    [provider]   amLODipine (NORVASC) 10 MG tablet Take 1 tablet (10 mg total) by mouth daily. 01/28/16   Thurnell Lose,  MD  amoxicillin-clavulanate (AUGMENTIN) 875-125 MG tablet Take 1 tablet by mouth 2 (two) times daily. One po bid x 7 days 08/31/19   Palumbo, April, MD  aspirin 81 MG tablet Take 1 tablet (81 mg total) by mouth daily. 08/04/18   Caren Griffins, MD  atorvastatin (LIPITOR) 20 MG tablet Take 20 mg by mouth at bedtime. 03/25/18   [provider]  celecoxib (CELEBREX) 200 MG capsule Take 200 mg by mouth daily. 05/29/18   [provider]  levorphanol (LEVODROMORAN) 2 MG tablet Take 2 mg by mouth 2 (two) times daily. 07/07/18   [provider]  metoprolol succinate (TOPROL XL) 25 MG 24 hr tablet Take 0.5 tablets (12.5 mg total) by mouth daily. 10/16/17 10/16/18  Nita Sells, MD  mirtazapine (REMERON) 7.5 MG tablet Take 1 tablet (7.5 mg total) by mouth at bedtime. 08/04/18   Caren Griffins, MD  nicotine (NICODERM CQ - DOSED IN MG/24 HOURS) 14 mg/24hr patch Place 1 patch (14 mg total) onto the skin daily. 08/04/18   Caren Griffins, MD    Physical Exam: Vitals:   11/04/19 1100 11/04/19 1115 11/04/19 1130 11/04/19 1145  BP: (!) 150/72 (!) 148/70 (!) 153/73 (!) 155/78  Pulse: 91 87 85 88  Resp: (!) 26 (!) 22 (!) 23 (!) 22  Temp:      TempSrc:      SpO2: 99% 99% 99% 99%     . General:  Appears calm and comfortable and is NAD; facial tremor noted . Eyes:  PERRL, EOMI, normal lids, iris; mild exophthalmos . ENT:  grossly normal hearing, lips & tongue, mmm - she reports that she has "had thrush" recently but this is not appreciated on exam at this time . Neck:  no LAD, masses or thyromegaly . Cardiovascular:  RR with mild tachycardia, no m/r/g. 2+ LE edema.  [name] Respiratory:   CTA bilaterally with no wheezes/rales/rhonchi.  Normal respiratory effort on [place] O2. . Abdomen:  soft, NT, ND, NABS . Skin:  no rash or induration seen on limited exam . Musculoskeletal:   grossly normal tone BUE/BLE, good ROM, no bony abnormality . Psychiatric:  grossly normal mood and affect, speech fluent and appropriate, AOx3 . Neurologic:  CN 2-12 grossly intact, moves all extremities in coordinated fashion    Radiological Exams on Admission: DG Chest Portable 1 View  Result Date: 11/04/2019 CLINICAL DATA:  Shortness of breath EXAM: PORTABLE CHEST 1 VIEW COMPARISON:  08/01/2018 FINDINGS: Unchanged mild cardiomegaly. Left-greater-than-right bibasilar opacities. Bilateral interstitial opacities, worse than on the prior study. Small left pleural effusion. IMPRESSION: Cardiomegaly, small left pleural effusion and interstitial opacities likely indicating cardiogenic pulmonary edema. Electronically Signed   By: Ulyses Jarred M.D.   On: 11/04/2019 03:19    EKG: Independently reviewed.  Sinus tachycardia with rate 109; LVH; nonspecific ST changes with no evidence of acute ischemia   Labs on Admission: I have personally reviewed the available labs and imaging studies at the time of the admission.  Pertinent labs:   CO2 21 BUN 23/Creatinine 1.43/GFR 36; 16/1.06/51 in 07/2018 BNP 1120.6; 116.0 in 04/2018 HS troponin 38 WBC 21.5 Hgb 9.6; 11.4 oin 07/2018 Blood cultures pending COVID negative ABG: 7.266/46.8/143.0/99% TSH 0.401   Assessment/Plan Principal Problem:   Acute respiratory failure with hypoxia (HCC) Active Problems:   Tobacco use disorder   COPD (chronic obstructive pulmonary disease) (HCC)   Diabetes mellitus with neurological manifestation (HCC)   HTN (hypertension)   Acute kidney injury superimposed on  CKD ([place])   Acute exacerbation of CHF (congestive heart failure) ([place])   DNR (do not resuscitate)   Acute hypoxic respiratory failure, likely associated with flash pulmonary edema -Patient with known h/o chronic diastolic CHF presenting with acute onset of SOB with hypoxia overnight -CXR consistent with pulmonary edema -Markedly elevated BNP  -With  elevated BNP and abnl CXR, acute decompensated CHF seems probable as diagnosis -Given sudden and acute onset, appears to be c/w flash pulmonary edema -Will admit, as per the Emergency HF Mortality Risk Grade.  The patient has: severe pulmonary edema requiring new O2 therapy; AKI on CKD with >25% decrease in GFR). -Will request echocardiogram -Will resume ASA -Will start Lisinopril 5 mg daily as of tomorrow -Continue Toprol XL -CHF order set utilized -Cardiology consult -Was given Lasix 40 mg x 1 in ER and will repeat with 40 mg IV BID -Marked improvement in symptoms with BIPAP and then transitioned to [place] O2 -Continue [place] O2 for now -Repeat EKG in AM -Mildly elevated HS troponin is likely related to demand ischemia; doubt ACS based on symptoms  AKI on stage 3a CKD -Likely due to decreased renal perfusion in the setting of CHF -Follow up renal function by BMP -Will start ACE tomorrow AM to allow for time for recovery  HTN -Hold Norvasc in the setting of edema -Continue Toprol XL -Add Lisinopril, as above  HLD -Continue Lipitor -Check lipids  Chronic pain -Continue high-dose Cymbalta -Continue levorphanol and Oxy IR -There does not appear to be an indication to add parenteral opiates at this time -I have reviewed this patient in the [place] Controlled Substances Reporting System.  She is receiving medications from only one provider and appears to be taking them as prescribed.  DM -Prior reported h/o DM -Last A1c in Epic was 5.4 in 2018 -Will not cover with SSI or monitor other than with fasting labs at this time.  COPD with ongoing tobacco dependence -She does not appear to be taking medications for COPD at baseline (including Albuterol) and does not have wheezing at this time -No current suspicion for exacerbation, will not continue abx (given x 1 in ER) at this time -Tobacco Dependence: encourage cessation. This was discussed with the patient and should be reviewed on an ongoing  basis.   -Patch ordered at patient request.  DNR -I have discussed code status with the patient and she would not desire resuscitation and would prefer to die a natural death should that situation arise.    *Note: Her sister reports that "she is in a rotten situation", unable to afford housing.  Recently bitten by rats.  There are people living there that cannot live there.   Her sister was paying for her and cannot afford to do anymore.  Her sister thinks she needs to be placed in a facility.  Her son and husband died and her daughter "has nothing to do with her."    Note: This patient has been tested and is negative for the novel coronavirus COVID-19.    DVT prophylaxis: Lovenox  Code Status:  DNR - confirmed with patient Family Communication: None present Disposition Plan:  Home once clinically improved Consults called: TOC team; PT Admission status: Admit - It is my clinical opinion that admission to INPATIENT is reasonable and necessary because this patient will require at least 2 midnights in the hospital to treat this condition based on the medical complexity of the problems presented.  Given the aforementioned information, the predictability of an adverse  outcome is felt to be significant.     [name] MD [facility]   How to contact the [facility] Attending or Consulting provider [place] or covering provider during after hours [place], for this patient?  1. Check the care team in [facility] and look for a) attending/consulting TR[facility] provider listed and b) the [facility] team listed 2. Log into [email or web address] and use [place]'s universal password to access. If you do not have the password, please contact the hospital operator. 3. Locate the [facility] provider you are looking for under [facility] and page to a number that you can be directly reached. 4. If you still have difficulty reaching the provider, please page the [facility] (Director on Call) for the Hospitalists listed on amion for  assistance.   11/04/2019, 1:06 PM

## 2019-11-04 NOTE — Progress Notes (Signed)
CAT started.

## 2019-11-04 NOTE — Consult Note (Signed)
Cardiology Consultation:   Patient ID: [name] MRN: [id number]; DOB: 26-Jul-1946  Admit date: 11/04/2019 Date of Consult: 11/04/2019  Primary Care Provider: [name], MD Primary Cardiologist: New Primary Electrophysiologist:  None    Patient Profile:   [name] is a 74 y.o. female with a hx of chronic diastolic who is being seen today for the evaluation of SOB at the request of Dr Lorin Mercy.  History of Present Illness:   Ms. No 74 yo female history of OSA, PAD, Parkinsons, HTN, COPD, chronic pain. Admission notes mention  CAD mentionedin H&P however no details available, patient denies any prior history of CAD.   Admitted with SOB. Some increase in her chronic LE edema. Gradual progression over the last several days, significantly worst yesterday. Isolated episode of left clavicular pain but no specific cardiac chest pains.     WBC 21.5 Hgb 9.6 Plt 305 K 4.1 Cr 1.43 GFR 36 BNP 1120 hstrop 38-->78--> COVID neg CXR cardiomegaly, small left effusion, interstial opacities Jan 2021 Echo LVEF 56-38%, indet diastolic function, normal RV function, normal LA EKG sinus tach, possible anteroseptal Qwaves     Heart Pathway Score:     Past Medical History:  Diagnosis Date  . Anxiety and depression   . Arthritis   . Asthma   . Carotid artery occlusion   . Chronic kidney disease   . Chronic pain    leg and feet  . COPD (chronic obstructive pulmonary disease) ([place])   . Coronary artery disease   . DDD (degenerative disc disease)   . Diarrhea    chronic   . DVT (deep venous thrombosis) ([place])   . Fibromyalgia   . GERD (gastroesophageal reflux disease)   . Grave's disease   . Headache   . Hypertension   . OP (osteoporosis)   . Parkinson's disease ([place])   . Peripheral vascular disease ([place])   . PUD (peptic ulcer disease)   . Recurrent upper respiratory infection (URI)   . Rhinitis   . Sleep apnea   . Spinal stenosis of lumbar region 04/23/2013  .  Thrombophlebitis   . Tobacco use disorder 04/23/2013  . Vitamin B 12 deficiency     Past Surgical History:  Procedure Laterality Date  . CAROTID ENDARTERECTOMY Left Sept. 20,2011   cea  . CHOLECYSTECTOMY  1999  . GASTRECTOMY     age 33   Part of small intestin and part of stomach  . LEFT HEART CATHETERIZATION WITH CORONARY ANGIOGRAM N/A 10/02/2011   Procedure: LEFT HEART CATHETERIZATION WITH CORONARY ANGIOGRAM;  Surgeon: Sueanne Margarita, MD;  Location: [facility];  Service: Cardiovascular;  Laterality: N/A;     Inpatient Medications: Scheduled Meds: . allopurinol  300 mg Oral Daily  . aspirin  81 mg Oral Daily  . atorvastatin  20 mg Oral QHS  . DULoxetine  20 mg Oral Daily  . DULoxetine  60 mg Oral BID  . enoxaparin (LOVENOX) injection  30 mg Subcutaneous Q24H  . furosemide  40 mg Intravenous BID  . [START ON 11/05/2019] lisinopril  5 mg Oral Daily  . metoprolol succinate  12.5 mg Oral Daily  . multivitamin with minerals  1 tablet Oral Daily  . nicotine  14 mg Transdermal Daily  . pantoprazole  40 mg Oral BID  . sodium chloride flush  3 mL Intravenous Q12H   Continuous Infusions: . sodium chloride     PRN Meds: sodium chloride, acetaminophen, ondansetron (ZOFRAN) IV, oxyCODONE-acetaminophen, sodium chloride flush  Allergies:   No Known Allergies  Social History:   Social History   Socioeconomic History  . Marital status: Widowed    Spouse name: Not on file  . Number of children: 1  . Years of education: Not on file  . Highest education level: Not on file  Occupational History  . Occupation: retired  Tobacco Use  . Smoking status: Current Every Day Smoker    Packs/day: 2.00    Years: 50.00    Pack years: 100.00    Types: Cigarettes  . Smokeless tobacco: Never Used  . Tobacco comment: cessation info given and reviewed  Substance and Sexual Activity  . Alcohol use: No    Alcohol/week: 0.0 standard drinks  . Drug use: No  . Sexual activity: Never    Birth  control/protection: Post-menopausal  Other Topics Concern  . Not on file  Social History Narrative  . Not on file   Social Determinants of Health   Financial Resource Strain:   . Difficulty of Paying Living Expenses: Not on file  Food Insecurity:   . Worried About Charity fundraiser in the Last Year: Not on file  . Ran Out of Food in the Last Year: Not on file  Transportation Needs:   . Lack of Transportation (Medical): Not on file  . Lack of Transportation (Non-Medical): Not on file  Physical Activity:   . Days of Exercise per Week: Not on file  . Minutes of Exercise per Session: Not on file  Stress:   . Feeling of Stress : Not on file  Social Connections:   . Frequency of Communication with Friends and Family: Not on file  . Frequency of Social Gatherings with Friends and Family: Not on file  . Attends Religious Services: Not on file  . Active Member of Clubs or Organizations: Not on file  . Attends Archivist Meetings: Not on file  . Marital Status: Not on file  Intimate Partner Violence:   . Fear of Current or Ex-Partner: Not on file  . Emotionally Abused: Not on file  . Physically Abused: Not on file  . Sexually Abused: Not on file    Family History:    Family History  Problem Relation Age of Onset  . Diabetes Mother   . Hyperlipidemia Mother   . Heart attack Mother   . Other Mother        varicose veins,respiratory,stroke  . Heart disease Mother        before age 51  . Hypertension Mother   . Varicose Veins Mother   . Diabetes Father   . Heart disease Father        before age 26  . Hyperlipidemia Father   . Heart attack Father   . Other Father        varicose veins  . Hypertension Father   . Varicose Veins Father   . Diabetes Sister   . Heart disease Sister        before age 72  . Hyperlipidemia Sister   . Heart attack Sister   . Other Sister        varicose veins  . Hypertension Sister   . Varicose Veins Sister   . Peripheral vascular  disease Sister   . Diabetes Sister   . Hyperlipidemia Sister   . Hypertension Sister   . Varicose Veins Sister      ROS:  Please see the history of present illness.   All other ROS reviewed  and negative.     Physical Exam/Data:   Vitals:   11/04/19 1630 11/04/19 1700 11/04/19 1730 11/04/19 1758  BP: (!) 148/83 (!) 167/96 (!) 164/102   Pulse: 91 90 89   Resp: 19 (!) 25 (!) 23   Temp:      TempSrc:      SpO2: 100% 96% 97%   Height:    5\' 4"  (1.626 m)    Intake/Output Summary (Last 24 hours) at 11/04/2019 1852 Last data filed at 11/04/2019 1039 Gross per 24 hour  Intake 300 ml  Output --  Net 300 ml   Last 3 Weights 08/02/2018 05/17/2018 05/16/2018  Weight (lbs) 102 lb 15.3 oz 103 lb 6.3 oz 101 lb  Weight (kg) 46.7 kg 46.9 kg 45.813 kg     Body mass index is 17.67 kg/m.  General:  Well nourished, well developed, in no acute distress HEENT: normal Lymph: no adenopathy Neck: elevated JVD Endocrine:  No thryomegaly Vascular: No carotid bruits; FA pulses 2+ bilaterally without bruits  Cardiac:  normal S1, S2; RRR; no murmur Lungs:  Coarse bialterally Abd: soft, nontender, no hepatomegaly  Ext: no edema Musculoskeletal:  No deformities, BUE and BLE strength normal and equal Skin: warm and dry  Neuro:  CNs 2-12 intact, no focal abnormalities noted Psych:  Normal affect    Laboratory Data:  High Sensitivity Troponin:   Recent Labs  Lab 11/04/19 0341 11/04/19 1551  TROPONINIHS 38* 78*     Chemistry Recent Labs  Lab 11/04/19 0241 11/04/19 0341 11/04/19 0352  NA 141 141 141  K 4.1 4.1 4.1  CL  --  110 110  CO2  --  21*  --   GLUCOSE  --  145* 137*  BUN  --  23 23  CREATININE  --  1.43* 1.30*  CALCIUM  --  7.9*  --   GFRNONAA  --  36*  --   GFRAA  --  42*  --   ANIONGAP  --  10  --     No results for input(s): PROT, ALBUMIN, AST, ALT, ALKPHOS, BILITOT in the last 168 hours. Hematology Recent Labs  Lab 11/04/19 0241 11/04/19 0341 11/04/19 0352  WBC   --  21.5*  --   RBC  --  3.39*  --   HGB 10.5* 9.6* 10.5*  HCT 31.0* 31.9* 31.0*  MCV  --  94.1  --   MCH  --  28.3  --   MCHC  --  30.1  --   RDW  --  17.8*  --   PLT  --  305  --    BNP Recent Labs  Lab 11/04/19 0341  BNP 1,120.6*    DDimer No results for input(s): DDIMER in the last 168 hours.   Radiology/Studies:  DG Chest Portable 1 View  Result Date: 11/04/2019 CLINICAL DATA:  Shortness of breath EXAM: PORTABLE CHEST 1 VIEW COMPARISON:  08/01/2018 FINDINGS: Unchanged mild cardiomegaly. Left-greater-than-right bibasilar opacities. Bilateral interstitial opacities, worse than on the prior study. Small left pleural effusion. IMPRESSION: Cardiomegaly, small left pleural effusion and interstitial opacities likely indicating cardiogenic pulmonary edema. Electronically Signed   By: Ulyses Jarred M.D.   On: 11/04/2019 03:19   ECHOCARDIOGRAM COMPLETE  Result Date: 11/04/2019   ECHOCARDIOGRAM REPORT   Patient Name:   YARELIS AMBROSINO Date of Exam: 11/04/2019 Medical Rec #:  [id number]       Height:       64.0 in Accession #:  [id number]      Weight:       103.0 lb Date of Birth:  02/18/1946        BSA:          1.48 m Patient Age:    21 years        BP:           154/81 mmHg Patient Gender: F               HR:           93 bpm. Exam Location:  Inpatient Procedure: 2D Echo, Cardiac Doppler and Color Doppler Indications:    I50.30* Unspecified diastolic (congestive) heart failure  History:        Patient has prior history of Echocardiogram examinations, most                 recent 03/17/2015. COPD, Signs/Symptoms:Chest Pain and Shortness                 of Breath; Risk Factors:Diabetes and Hypertension. Opiate                 overdose and suicide attempt.  Sonographer:    Roseanna Rainbow RDCS Referring Phys: [place]  1. Left ventricular ejection fraction, by visual estimation, is 60 to 65%. The left ventricle has normal function. There is moderately increased left ventricular  hypertrophy.  2. Left ventricular diastolic parameters are indeterminate.  3. Global right ventricle has normal systolic function.The right ventricular size is normal.  4. Left atrial size was normal.  5. Right atrial size was normal.  6. The mitral valve is normal in structure. Trivial mitral valve regurgitation.  7. The tricuspid valve is normal in structure.  8. The aortic valve is tricuspid. Aortic valve regurgitation is not visualized. No evidence of aortic valve sclerosis or stenosis.  9. The pulmonic valve was not well visualized. Pulmonic valve regurgitation is not visualized. 10. The inferior vena cava is dilated in size with >50% respiratory variability, suggesting right atrial pressure of 8 mmHg. 11. The tricuspid regurgitant velocity is 2.53 m/s, and with an assumed right atrial pressure of 8 mmHg, the estimated right ventricular systolic pressure is mildly elevated at 33.6 mmHg. FINDINGS  Left Ventricle: Left ventricular ejection fraction, by visual estimation, is 60 to 65%. The left ventricle has normal function. The left ventricle has no regional wall motion abnormalities. The left ventricular internal cavity size was the left ventricle is normal in size. There is moderately increased left ventricular hypertrophy. Left ventricular diastolic parameters are indeterminate. Right Ventricle: The right ventricular size is normal. No increase in right ventricular wall thickness. Global RV systolic function is has normal systolic function. The tricuspid regurgitant velocity is 2.53 m/s, and with an assumed right atrial pressure  of 8 mmHg, the estimated right ventricular systolic pressure is mildly elevated at 33.6 mmHg. Left Atrium: Left atrial size was normal in size. Right Atrium: Right atrial size was normal in size Pericardium: There is no evidence of pericardial effusion. Mitral Valve: The mitral valve is normal in structure. Trivial mitral valve regurgitation. MV peak gradient, 13.5 mmHg. Tricuspid  Valve: The tricuspid valve is normal in structure. Tricuspid valve regurgitation is trivial. Aortic Valve: The aortic valve is tricuspid. Aortic valve regurgitation is not visualized. The aortic valve is structurally normal, with no evidence of sclerosis or stenosis. Pulmonic Valve: The pulmonic valve was not well visualized. Pulmonic valve regurgitation is not visualized. Pulmonic regurgitation is  not visualized. Aorta: The aortic root and ascending aorta are structurally normal, with no evidence of dilitation. Venous: The inferior vena cava is dilated in size with greater than 50% respiratory variability, suggesting right atrial pressure of 8 mmHg. IAS/Shunts: The atrial septum is grossly normal.  LEFT VENTRICLE PLAX 2D LVIDd:         3.50 cm       Diastology LVIDs:         2.40 cm       LV e' lateral:   4.03 cm/s LV PW:         1.50 cm       LV E/e' lateral: 21.6 LV IVS:        1.40 cm       LV e' medial:    5.66 cm/s LVOT diam:     1.90 cm       LV E/e' medial:  15.4 LV SV:         31 ml LV SV Index:   21.28 LVOT Area:     2.84 cm  LV Volumes (MOD) LV area d, A2C:    28.10 cm LV area d, A4C:    22.80 cm LV area s, A2C:    15.50 cm LV area s, A4C:    14.05 cm LV major d, A2C:   7.59 cm LV major d, A4C:   6.53 cm LV major s, A2C:   6.22 cm LV major s, A4C:   5.70 cm LV vol d, MOD A2C: 86.1 ml LV vol d, MOD A4C: 65.3 ml LV vol s, MOD A2C: 30.6 ml LV vol s, MOD A4C: 29.1 ml LV SV MOD A2C:     55.5 ml LV SV MOD A4C:     65.3 ml LV SV MOD BP:      49.6 ml RIGHT VENTRICLE             IVC RV S prime:     13.20 cm/s  IVC diam: 2.20 cm TAPSE (M-mode): 2.2 cm LEFT ATRIUM             Index       RIGHT ATRIUM          Index LA diam:        3.20 cm 2.17 cm/m  RA Area:     8.94 cm LA Vol (A2C):   26.3 ml 17.83 ml/m RA Volume:   17.70 ml 12.00 ml/m LA Vol (A4C):   38.0 ml 25.76 ml/m LA Biplane Vol: 31.9 ml 21.62 ml/m  AORTIC VALVE LVOT Vmax:   107.00 cm/s LVOT Vmean:  82.500 cm/s LVOT VTI:    0.235 m  AORTA Ao Root  diam: 2.80 cm Ao Asc diam:  3.20 cm MITRAL VALVE                         TRICUSPID VALVE MV Area (PHT): 4.15 cm              TR Peak grad:   25.6 mmHg MV Peak grad:  13.5 mmHg             TR Vmax:        253.00 cm/s MV Mean grad:  3.0 mmHg MV Vmax:       1.84 m/s              SHUNTS MV Vmean:      79.1 cm/s  Systemic VTI:  0.24 m MV VTI:        0.36 m                Systemic Diam: 1.90 cm MV PHT:        53.07 msec MV Decel Time: 183 msec MV E velocity: 86.90 cm/s  103 cm/s MV A velocity: 141.00 cm/s 70.3 cm/s MV E/A ratio:  0.62        1.5  Oswaldo Milian MD Electronically signed by Oswaldo Milian MD Signature Date/Time: 11/04/2019/5:59:05 PM    Final    {   Assessment and Plan:   1. Acute on chronic diastolic HF - echo as reported above - CXR with pulm edema, elevated BNP, LE edema on presentation  - I/Os incomplete, just admitted this AM. She received 40mg  of IV lasix x 2 today, has not had repeat labs. Follow I/Os and labs tomorrow, reassess diuretic dosing. I have not left a scheduled dose for tomorrow, redose pending evaluation in AM   2. Elevated troponin - fairly mild elevation in setting of HF with volume overload - echo with normal LVEF. EKG with some artifact but nonspecific acute ischemic changes.  - isolated left clavicular pain not consistent with cardiac chest pain, resolved. - no plans for ischemic testing at this time.    3. AKI on CKD -baseline Cr 1 to 1.2. Low body weight, Cr is deceptive regarding her GFR. Most recent Cr 1.4 with GFR 36 - follor renal function with diuresis.   4. COPD - per primary team  5. Leukocytosis - management per primary team, WBC 21 on admissoin  6. HTN - started on ACEI newly this admit, would d/c given borderline renal function and need to diurese.  - can continue her home toprol, restart her home norvac.   For questions or updates, please contact [facility] Please consult [email or web address] for contact info under       Signed, Carlyle Dolly, MD  11/04/2019 6:52 PM

## 2019-11-04 NOTE — Progress Notes (Signed)
Patient is now off the NIV machine at this time. Patient is now on a Nasal Cannula tolerating it well and patient clinical presentation is much more comfortable, patient states she feel better than before.

## 2019-11-04 NOTE — ED Notes (Signed)
Lunch Ordered @ 1346.

## 2019-11-04 NOTE — ED Notes (Addendum)
ED TO INPATIENT HANDOFF REPORT  ED Nurse Name and Phone #: Thurmond Butts [place] Name/Age/Gender [name] 74 y.o. female Room/Bed: 006C/006C  Code Status   Code Status: DNR  Home/SNF/Other Home Patient oriented to: self, place, time and situation Is this baseline? Yes   Triage Complete: Triage complete  Chief Complaint Acute respiratory failure with hypoxia ([place]) [J96.01] Acute exacerbation of CHF (congestive heart failure) ([place]) [I50.9]  Triage Note Pt [facility]IB [facility] from home C/O [facility]. Pt states she went to bed fine and woke up out her sleep having a hard time breathing. Pt does not wear O2 at home. Pt was sating 85% on RA. Pt placed on 15L non-rebreather and was sating 98% with EMS. Pt denies any pain or COVID exposures that she knows of.     Allergies No Known Allergies  Level of Care/Admitting Diagnosis ED Disposition    ED Disposition Condition Comment   Admit  Hospital Area: [facility] [[id number]]  Level of Care: Telemetry Cardiac [[id number]]  Covid Evaluation: Confirmed COVID Negative  Diagnosis: Acute exacerbation of CHF (congestive heart failure) [facility]) [[id number]]  Admitting Physician: [name] [[id number]]  Attending Physician: [name] [[id number]]  Estimated length of stay: 3 - 4 days  Certification:: I certify this patient will need inpatient services for at least 2 midnights       B Medical/Surgery History Past Medical History:  Diagnosis Date  . Anxiety and depression   . Arthritis   . Asthma   . Carotid artery occlusion   . Chronic kidney disease   . Chronic pain    leg and feet  . COPD (chronic obstructive pulmonary disease) ([place])   . Coronary artery disease   . DDD (degenerative disc disease)   . Diarrhea    chronic   . DVT (deep venous thrombosis) ([place])   . Fibromyalgia   . GERD (gastroesophageal reflux disease)   . Grave's disease   . Headache   . Hypertension   . OP (osteoporosis)   . Parkinson's disease ([place])   .  Peripheral vascular disease ([place])   . PUD (peptic ulcer disease)   . Recurrent upper respiratory infection (URI)   . Rhinitis   . Sleep apnea   . Spinal stenosis of lumbar region 04/23/2013  . Thrombophlebitis   . Tobacco use disorder 04/23/2013  . Vitamin B 12 deficiency    Past Surgical History:  Procedure Laterality Date  . CAROTID ENDARTERECTOMY Left Sept. 20,2011   cea  . CHOLECYSTECTOMY  1999  . GASTRECTOMY     age 13   Part of small intestin and part of stomach  . LEFT HEART CATHETERIZATION WITH CORONARY ANGIOGRAM N/A 10/02/2011   Procedure: LEFT HEART CATHETERIZATION WITH CORONARY ANGIOGRAM;  Surgeon: Sueanne Margarita, MD;  Location: [place] [facility] CATH LAB;  Service: Cardiovascular;  Laterality: N/A;     A IV Location/Drains/Wounds Patient Lines/Drains/Airways Status   Active Line/Drains/Airways    Name:   Placement date:   Placement time:   Site:   Days:   Peripheral IV 11/04/19 Left Forearm   11/04/19    0219    Forearm   less than 1   Peripheral IV 11/04/19 Right Antecubital   11/04/19    0400    Antecubital   less than 1          Intake/Output Last 24 hours  Intake/Output Summary (Last 24 hours) at 11/04/2019 1707 Last data filed at 11/04/2019 1039 Gross per 24 hour  Intake 300 ml  Output --  Net 300 ml    Labs/Imaging Results for orders placed or performed during the hospital encounter of 11/04/19 (from the past 48 hour(s))  SARS CORONAVIRUS 2 (TAT 6-24 HRS) Nasopharyngeal Nasopharyngeal Swab     Status: None   Collection Time: 11/04/19  2:31 AM   Specimen: Nasopharyngeal Swab  Result Value Ref Range   SARS Coronavirus 2 NEGATIVE NEGATIVE    Comment: (NOTE) SARS-CoV-2 target nucleic acids are NOT DETECTED. The SARS-CoV-2 RNA is generally detectable in upper and lower respiratory specimens during the acute phase of infection. Negative results do not preclude SARS-CoV-2 infection, do not rule out co-infections with other pathogens, and should not be used as the sole  basis for treatment or other patient management decisions. Negative results must be combined with clinical observations, patient history, and epidemiological information. The expected result is Negative. Fact Sheet for Patients: SugarRoll.be Fact Sheet for Healthcare Providers: https://www.woods-mathews.com/ This test is not yet approved or cleared by the [place] FDA and  has been authorized for detection and/or diagnosis of SARS-CoV-2 by FDA under an Emergency Use Authorization (EUA). This EUA will remain  in effect (meaning this test can be used) for the duration of the COVID-19 declaration under Section 56 4(b)(1) of the Act, 21 U.S.C. section 360bbb-3(b)(1), unless the authorization is terminated or revoked sooner. Performed at [facility], [place] [address] [place], [place] [address]   I-STAT 7, (LYTES, BLD GAS, ICA, H+H)     Status: Abnormal   Collection Time: 11/04/19  2:41 AM  Result Value Ref Range   pH, Arterial 7.266 (L) 7.350 - 7.450   pCO2 arterial 46.8 32.0 - 48.0 mmHg   pO2, Arterial 143.0 (H) 83.0 - 108.0 mmHg   Bicarbonate 21.5 20.0 - 28.0 mmol/L   TCO2 23 22 - 32 mmol/L   O2 Saturation 99.0 %   Acid-base deficit 6.0 (H) 0.0 - 2.0 mmol/L   Sodium 141 135 - 145 mmol/L   Potassium 4.1 3.5 - 5.1 mmol/L   Calcium, Ion 1.18 1.15 - 1.40 mmol/L   HCT 31.0 (L) 36.0 - 46.0 %   Hemoglobin 10.5 (L) 12.0 - 15.0 g/dL   Patient temperature 97.1 F    Sample type ARTERIAL   CBC with Differential     Status: Abnormal   Collection Time: 11/04/19  3:41 AM  Result Value Ref Range   WBC 21.5 (H) 4.0 - 10.5 K/uL   RBC 3.39 (L) 3.87 - 5.11 MIL/uL   Hemoglobin 9.6 (L) 12.0 - 15.0 g/dL   HCT 31.9 (L) 36.0 - 46.0 %   MCV 94.1 80.0 - 100.0 fL   MCH 28.3 26.0 - 34.0 pg   MCHC 30.1 30.0 - 36.0 g/dL   RDW 17.8 (H) 11.5 - 15.5 %   Platelets 305 150 - 400 K/uL   nRBC 0.0 0.0 - 0.2 %   Neutrophils Relative % 89 %   Neutro Abs 19.0  (H) 1.7 - 7.7 K/uL   Lymphocytes Relative 6 %   Lymphs Abs 1.4 0.7 - 4.0 K/uL   Monocytes Relative 4 %   Monocytes Absolute 0.9 0.1 - 1.0 K/uL   Eosinophils Relative 0 %   Eosinophils Absolute 0.1 0.0 - 0.5 K/uL   Basophils Relative 0 %   Basophils Absolute 0.1 0.0 - 0.1 K/uL   Immature Granulocytes 1 %   Abs Immature Granulocytes 0.15 (H) 0.00 - 0.07 K/uL    Comment: Performed at  [facility], [place] [address], [place], [place] [address]  Basic metabolic panel     Status: Abnormal   Collection Time: 11/04/19  3:41 AM  Result Value Ref Range   Sodium 141 135 - 145 mmol/L   Potassium 4.1 3.5 - 5.1 mmol/L   Chloride 110 98 - 111 mmol/L   CO2 21 (L) 22 - 32 mmol/L   Glucose, Bld 145 (H) 70 - 99 mg/dL   BUN 23 8 - 23 mg/dL   Creatinine, Ser 1.43 (H) 0.44 - 1.00 mg/dL   Calcium 7.9 (L) 8.9 - 10.3 mg/dL   GFR calc non Af Amer 36 (L) >60 mL/min   GFR calc Af Amer 42 (L) >60 mL/min   Anion gap 10 5 - 15    Comment: Performed at [facility] [address], [place], [place] [address]  Troponin I (High Sensitivity)     Status: Abnormal   Collection Time: 11/04/19  3:41 AM  Result Value Ref Range   Troponin I (High Sensitivity) 38 (H) <18 ng/L    Comment: (NOTE) Elevated high sensitivity troponin I (hsTnI) values and significant  changes across serial measurements may suggest ACS but many other  chronic and acute conditions are known to elevate hsTnI results.  Refer to the "Links" section for chest pain algorithms and additional  guidance. Performed at [facility], [place] [address], [place], [place] [address]   Brain natriuretic peptide     Status: Abnormal   Collection Time: 11/04/19  3:41 AM  Result Value Ref Range   B Natriuretic Peptide 1,120.6 (H) 0.0 - 100.0 pg/mL    Comment: Performed at [facility] [address], [place], [place] [address]  CBG monitoring, ED     Status: Abnormal   Collection Time: 11/04/19  3:42 AM  Result Value Ref Range    Glucose-Capillary 132 (H) 70 - 99 mg/dL  I-stat chem 8, ED (not at [facility] or [facility])     Status: Abnormal   Collection Time: 11/04/19  3:52 AM  Result Value Ref Range   Sodium 141 135 - 145 mmol/L   Potassium 4.1 3.5 - 5.1 mmol/L   Chloride 110 98 - 111 mmol/L   BUN 23 8 - 23 mg/dL   Creatinine, Ser 1.30 (H) 0.44 - 1.00 mg/dL   Glucose, Bld 137 (H) 70 - 99 mg/dL   Calcium, Ion 1.10 (L) 1.15 - 1.40 mmol/L   TCO2 23 22 - 32 mmol/L   Hemoglobin 10.5 (L) 12.0 - 15.0 g/dL   HCT 31.0 (L) 36.0 - 46.0 %  Urinalysis, Routine w reflex microscopic     Status: Abnormal   Collection Time: 11/04/19  9:11 AM  Result Value Ref Range   Color, Urine STRAW (A) YELLOW   APPearance CLEAR CLEAR   Specific Gravity, Urine 1.010 1.005 - 1.030   pH 5.0 5.0 - 8.0   Glucose, UA NEGATIVE NEGATIVE mg/dL   Hgb urine dipstick NEGATIVE NEGATIVE   Bilirubin Urine NEGATIVE NEGATIVE   Ketones, ur NEGATIVE NEGATIVE mg/dL   Protein, ur NEGATIVE NEGATIVE mg/dL   Nitrite NEGATIVE NEGATIVE   Leukocytes,Ua MODERATE (A) NEGATIVE   RBC / HPF 0-5 0 - 5 RBC/hpf   WBC, UA 0-5 0 - 5 WBC/hpf   Bacteria, UA RARE (A) NONE SEEN   Squamous Epithelial / LPF 0-5 0 - 5   Mucus PRESENT    Non Squamous Epithelial 0-5 (A) NONE SEEN  Comment: Performed at [facility], [place] [address], [place], [place] [address]  TSH     Status: None   Collection Time: 11/04/19 11:25 AM  Result Value Ref Range   TSH 0.401 0.350 - 4.500 uIU/mL    Comment: Performed by a 3rd Generation assay with a functional sensitivity of <=0.01 uIU/mL. Performed at [facility], [place] [address] [place], [place] [address]    DG Chest Portable 1 View  Result Date: 11/04/2019 CLINICAL DATA:  Shortness of breath EXAM: PORTABLE CHEST 1 VIEW COMPARISON:  08/01/2018 FINDINGS: Unchanged mild cardiomegaly. Left-greater-than-right bibasilar opacities. Bilateral interstitial opacities, worse than on the prior study. Small left pleural effusion. IMPRESSION:  Cardiomegaly, small left pleural effusion and interstitial opacities likely indicating cardiogenic pulmonary edema. Electronically Signed   By: Ulyses Jarred M.D.   On: 11/04/2019 03:19    Pending Labs Unresulted Labs (From admission, onward)    Start     Ordered   11/05/19 5597  Basic metabolic panel  Daily,   R     11/04/19 0944   11/05/19 0500  Lipid panel  Tomorrow morning,   R     11/04/19 1319   11/05/19 0000  CBC WITH DIFFERENTIAL  Tomorrow morning,   R     11/04/19 0944   11/04/19 0544  Urine culture  ONCE - STAT,   STAT     11/04/19 0543   11/04/19 0543  Blood culture (routine x 2)  BLOOD CULTURE X 2,   STAT     11/04/19 0543          Vitals/Pain Today's Vitals   11/04/19 1500 11/04/19 1530 11/04/19 1630 11/04/19 1700  BP: (!) 172/84 (!) 134/95 (!) 148/83 (!) 167/96  Pulse: 95 90 91 90  Resp: (!) 24 15 19  (!) 25  Temp:      TempSrc:      SpO2: 100% 100% 100% 96%  PainSc:        Isolation Precautions No active isolations  Medications Medications  aspirin chewable tablet 81 mg (81 mg Oral Given 11/04/19 1033)  allopurinol (ZYLOPRIM) tablet 300 mg (300 mg Oral Given 11/04/19 1032)  oxyCODONE-acetaminophen (PERCOCET) 7.5-325 MG per tablet 1 tablet (1 tablet Oral Given 11/04/19 1340)  atorvastatin (LIPITOR) tablet 20 mg (has no administration in time range)  metoprolol succinate (TOPROL-XL) 24 hr tablet 12.5 mg (12.5 mg Oral Given 11/04/19 1034)  DULoxetine (CYMBALTA) DR capsule 20 mg (20 mg Oral Given 11/04/19 1034)  DULoxetine (CYMBALTA) DR capsule 60 mg (60 mg Oral Given 11/04/19 1034)  nicotine (NICODERM CQ - dosed in mg/24 hours) patch 14 mg (14 mg Transdermal Patch Applied 11/04/19 1036)  pantoprazole (PROTONIX) EC tablet 40 mg (40 mg Oral Given 11/04/19 1033)  sodium chloride flush (NS) 0.9 % injection 3 mL (3 mLs Intravenous Given 11/04/19 1040)  sodium chloride flush (NS) 0.9 % injection 3 mL (has no administration in time range)  0.9 %  sodium chloride infusion (has no  administration in time range)  acetaminophen (TYLENOL) tablet 650 mg (has no administration in time range)  ondansetron (ZOFRAN) injection 4 mg (has no administration in time range)  furosemide (LASIX) injection 40 mg (has no administration in time range)  lisinopril (ZESTRIL) tablet 5 mg (has no administration in time range)  enoxaparin (LOVENOX) injection 30 mg (30 mg Subcutaneous Given 11/04/19 1036)  multivitamin with minerals tablet 1 tablet (1 tablet Oral Given 11/04/19 1033)  methylPREDNISolone sodium succinate (SOLU-MEDROL) 125 mg/2 mL  injection 125 mg (125 mg Intravenous Given 11/04/19 0323)  albuterol (PROVENTIL,VENTOLIN) solution continuous neb (15 mg/hr Nebulization Given 11/04/19 0248)  ipratropium (ATROVENT) nebulizer solution 1 mg (1 mg Nebulization Given 11/04/19 0248)  magnesium sulfate IVPB 2 g 50 mL (0 g Intravenous Stopped 11/04/19 0427)  furosemide (LASIX) injection 40 mg (40 mg Intravenous Given 11/04/19 0447)  azithromycin (ZITHROMAX) 500 mg in sodium chloride 0.9 % 250 mL IVPB (0 mg Intravenous Stopped 11/04/19 1039)    Mobility walks High fall risk   Focused Assessments    R Recommendations: See Admitting Provider Note  Report given to: [place] RN  Additional Notes:

## 2019-11-04 NOTE — Progress Notes (Signed)
  Echocardiogram 2D Echocardiogram has been performed.  Bobbye Charleston 11/04/2019, 12:07 PM

## 2019-11-04 NOTE — ED Triage Notes (Signed)
Pt BIB [facility] from home C/O [facility]. Pt states she went to bed fine and woke up out her sleep having a hard time breathing. Pt does not wear O2 at home. Pt was sating 85% on RA. Pt placed on 15L non-rebreather and was sating 98% with EMS. Pt denies any pain or COVID exposures that she knows of.

## 2019-11-04 NOTE — Progress Notes (Signed)
Patient is presenting with hypoxia in the setting of increase WOB, tripoding, and apparently respiratory compromise. BBS to auscultation reveals fine crackles w/ some diffuse expiratory wheezing. Patient placed on NIV per MD Ward verbal order. Settings are in the flowsheet.  Goal is to establish and maintain good ventilation and oxygenation with the utilization of NIV using appropriate settings to achieve optimal systemic and myocardial oxygenation demands.   Lavell Ridings L. Tamala Julian, BS, RRT, RCP

## 2019-11-04 NOTE — ED Notes (Signed)
ABG collected  

## 2019-11-04 NOTE — ED Notes (Signed)
Pt sitting up  And eating lunch tray

## 2019-11-04 NOTE — ED Notes (Signed)
Dr. Lorin Mercy at bedside, meal tray delivered.

## 2019-11-05 ENCOUNTER — Encounter (HOSPITAL_COMMUNITY): Payer: Self-pay | Admitting: Internal Medicine

## 2019-11-05 DIAGNOSIS — J9601 Acute respiratory failure with hypoxia: Secondary | ICD-10-CM

## 2019-11-05 DIAGNOSIS — J441 Chronic obstructive pulmonary disease with (acute) exacerbation: Secondary | ICD-10-CM

## 2019-11-05 LAB — BASIC METABOLIC PANEL
Anion gap: 8 (ref 5–15)
BUN: 30 mg/dL — ABNORMAL HIGH (ref 8–23)
CO2: 25 mmol/L (ref 22–32)
Calcium: 8.5 mg/dL — ABNORMAL LOW (ref 8.9–10.3)
Chloride: 106 mmol/L (ref 98–111)
Creatinine, Ser: 1.62 mg/dL — ABNORMAL HIGH (ref 0.44–1.00)
GFR calc Af Amer: 36 mL/min — ABNORMAL LOW (ref >60)
GFR calc non Af Amer: 31 mL/min — ABNORMAL LOW (ref >60)
Glucose, Bld: 104 mg/dL — ABNORMAL HIGH (ref 70–99)
Potassium: 5 mmol/L (ref 3.5–5.1)
Sodium: 139 mmol/L (ref 135–145)

## 2019-11-05 LAB — LIPID PANEL
Cholesterol: 145 mg/dL (ref 0–200)
HDL: 64 mg/dL (ref >40)
LDL Cholesterol: 63 mg/dL (ref 0–99)
Total CHOL/HDL Ratio: 2.3 RATIO
Triglycerides: 90 mg/dL (ref <150)
VLDL: 18 mg/dL (ref 0–40)

## 2019-11-05 LAB — CBC WITH DIFFERENTIAL/PLATELET
Abs Immature Granulocytes: 0.06 10*3/uL (ref 0.00–0.07)
Basophils Absolute: 0 10*3/uL (ref 0.0–0.1)
Basophils Relative: 0 %
Eosinophils Absolute: 0 10*3/uL (ref 0.0–0.5)
Eosinophils Relative: 0 %
HCT: 28.1 % — ABNORMAL LOW (ref 36.0–46.0)
Hemoglobin: 8.8 g/dL — ABNORMAL LOW (ref 12.0–15.0)
Immature Granulocytes: 1 %
Lymphocytes Relative: 9 %
Lymphs Abs: 1 10*3/uL (ref 0.7–4.0)
MCH: 28.7 pg (ref 26.0–34.0)
MCHC: 31.3 g/dL (ref 30.0–36.0)
MCV: 91.5 fL (ref 80.0–100.0)
Monocytes Absolute: 0.5 10*3/uL (ref 0.1–1.0)
Monocytes Relative: 5 %
Neutro Abs: 9 10*3/uL — ABNORMAL HIGH (ref 1.7–7.7)
Neutrophils Relative %: 85 %
Platelets: 330 10*3/uL (ref 150–400)
RBC: 3.07 MIL/uL — ABNORMAL LOW (ref 3.87–5.11)
RDW: 17.5 % — ABNORMAL HIGH (ref 11.5–15.5)
WBC: 10.6 10*3/uL — ABNORMAL HIGH (ref 4.0–10.5)
nRBC: 0 % (ref 0.0–0.2)

## 2019-11-05 LAB — URINE CULTURE: Culture: NO GROWTH

## 2019-11-05 LAB — D-DIMER, QUANTITATIVE: D-Dimer, Quant: 1.66 ug/mL-FEU — ABNORMAL HIGH (ref 0.00–0.50)

## 2019-11-05 LAB — MRSA PCR SCREENING: MRSA by PCR: NEGATIVE

## 2019-11-05 MED ORDER — CARVEDILOL 6.25 MG PO TABS
6.2500 mg | ORAL_TABLET | Freq: Two times a day (BID) | ORAL | Status: DC
  Filled 2019-11-05: qty 1

## 2019-11-05 MED ORDER — METOPROLOL SUCCINATE ER 50 MG PO TB24
50.0000 mg | ORAL_TABLET | Freq: Every day | ORAL | Status: DC
  Administered 2019-11-05 – 2019-11-09 (×5): 50 mg via ORAL
  Filled 2019-11-05 (×5): qty 1

## 2019-11-05 NOTE — TOC Initial Note (Addendum)
Transition of Care [facility]) - Initial/Assessment Note    Patient Details  Name: [name] MRN: [id number] Date of Birth: September 04, 1946  Transition of [facility]) CM/SW Contact:    Alberteen Sam, [place] Phone Number: [phone] 11/05/2019, 2:15 PM  Clinical Narrative:                 CSW consulted for heart failure home health screen and consulted for patient having concerns that someone is stealing her meds.   CSW inquired as to home support, patient reports she lives mostly alone with her friend's son that stays occasionally but is a Administrator, so he is typically not home. She reports being independent and no needs at home currently, she states she is active with [facility] for an aide and identifies no other home health needs at this time.   Addressing the stolen medications, patient reports someone is stealing her pain medications and this occurs very often. She reports she has been accused of taking them, however she states she is in too much pain to have taken more pain meds than prescribed. CSW advised patient to call the police if she is concerned about stolen items, or acquiring a lock box for medications if she is concerned about others in the home having access. Patient agreeable to this information, reports she will think about getting a lock box for her medications.   Expected Discharge Plan: Home/Self Care Barriers to Discharge: Continued Medical Work up   Patient Goals and CMS Choice Patient states their goals for this hospitalization and ongoing recovery are:: to go home CMS Medicare.gov Compare Post Acute Care list provided to:: Patient Choice offered to / list presented to : Patient  Expected Discharge Plan and Services Expected Discharge Plan: Home/Self Care       Living arrangements for the past 2 months: Single Family Home                                      Prior Living Arrangements/Services Living arrangements for the past 2 months: Single  Family Home Lives with:: Friends   Do you feel safe going back to the place where you live?: Yes      Need for Family Participation in Patient Care: No (Comment) Care giver support system in place?: No (comment)   Criminal Activity/Legal Involvement Pertinent to Current Situation/Hospitalization: No - Comment as needed  Activities of Daily Living Home Assistive Devices/Equipment: Eyeglasses, Cane (specify quad or straight), Walker (specify type), Grab bars in shower ADL Screening (condition at time of admission) Patient's cognitive ability adequate to safely complete daily activities?: Yes Is the patient deaf or have difficulty hearing?: Yes Does the patient have difficulty seeing, even when wearing glasses/contacts?: Yes Does the patient have difficulty concentrating, remembering, or making decisions?: No Patient able to express need for assistance with ADLs?: Yes Does the patient have difficulty dressing or bathing?: No Independently performs ADLs?: Yes (appropriate for developmental age) Does the patient have difficulty walking or climbing stairs?: No Weakness of Legs: Both Weakness of Arms/Hands: None  Permission Sought/Granted Permission sought to share information with : Case Manager Permission granted to share information with : Yes, Verbal Permission Granted              Emotional Assessment Appearance:: Appears stated age Attitude/Demeanor/Rapport: Gracious Affect (typically observed): Calm Orientation: : Oriented to Self, Oriented to Place, Oriented to  Time,  Oriented to Situation Alcohol / Substance Use: Not Applicable Psych Involvement: No (comment)  Admission diagnosis:  Acute pulmonary edema (HCC) [J81.0] COPD exacerbation (HCC) [J44.1] Acute exacerbation of CHF (congestive heart failure) ([name]) [I50.9] Acute respiratory failure with hypoxia ([place]) [J96.01] Patient Active Problem List   Diagnosis Date Noted  . Acute respiratory failure with hypoxia ([place])  11/04/2019  . Acute exacerbation of CHF (congestive heart failure) ([place]) 11/04/2019  . DNR (do not resuscitate) 11/04/2019  . Chronic pain syndrome 11/04/2019  . Pulmonary nodule, left 08/02/2018  . Major depressive disorder, single episode, moderate ([place]) 08/02/2018  . Benzodiazepine overdose of undetermined intent 08/01/2018  . Cellulitis 05/14/2018  . Anemia 05/14/2018  . At risk for adverse drug event 10/17/2017  . Salicylate intoxication, undetermined intent, subsequent encounter 10/10/2017  . Acute kidney injury superimposed on CKD ([place]) 10/10/2017  . Headache 10/10/2017  . Protein-calorie malnutrition, severe 01/26/2016  . Opiate overdose ([place]) 01/25/2016  . Suicide attempt ([place]) 01/25/2016  . Depression   . Dyslipidemia   . Gastroesophageal reflux disease without esophagitis   . COPD (chronic obstructive pulmonary disease) ([place]) 07/29/2015  . Diabetes mellitus with neurological manifestation ([place]) 07/29/2015  . Frequent falls 07/29/2015  . HTN (hypertension) 07/29/2015  . AKI (acute kidney injury) ([place]) 07/29/2015  . SIRS (systemic inflammatory response syndrome) ([place]) 07/29/2015  . Spinal stenosis of lumbar region 04/23/2013  . Tremor due to multiple drugs 04/23/2013  . Tobacco use disorder 04/23/2013  . COPD exacerbation ([place]) 04/23/2013  . Occlusion and stenosis of carotid artery without mention of cerebral infarction 03/12/2012  . Viral bronchitis-possible h. infl vs Norovirus 11/21/2011  . Pleuritic chest pain 09/18/2011   PCP:  [name], MD Pharmacy:   [facility] [place], [place] - [address] [place] [address] Phone: [phone] Fax: [phone]     Social Determinants of Health (SDOH) Interventions    Readmission Risk Interventions No flowsheet data found.

## 2019-11-05 NOTE — Progress Notes (Addendum)
Progress Note  Patient Name: [name] Date of Encounter: 11/05/2019  Primary Cardiologist:  Ena Dawley, MD, new  Subjective   SOB not much change, mild CP this am, has resolved. Says will not smoke after d/c  Inpatient Medications    Scheduled Meds: . allopurinol  300 mg Oral Daily  . amLODipine  10 mg Oral Daily  . aspirin  81 mg Oral Daily  . atorvastatin  20 mg Oral QHS  . DULoxetine  20 mg Oral Daily  . DULoxetine  60 mg Oral BID  . enoxaparin (LOVENOX) injection  30 mg Subcutaneous Q24H  . metoprolol succinate  12.5 mg Oral Daily  . multivitamin with minerals  1 tablet Oral Daily  . nicotine  14 mg Transdermal Daily  . pantoprazole  40 mg Oral BID  . sodium chloride flush  3 mL Intravenous Q12H   Continuous Infusions: . sodium chloride     PRN Meds: sodium chloride, acetaminophen, ondansetron (ZOFRAN) IV, oxyCODONE-acetaminophen, sodium chloride flush   Vital Signs    Vitals:   11/04/19 2032 11/05/19 0456 11/05/19 0459 11/05/19 0832  BP: (!) 156/81 (!) 171/92  124/65  Pulse: 90 92  87  Resp: 20 20    Temp: 98 F (36.7 C) 98.5 F (36.9 C)    TempSrc: Oral Oral    SpO2: 97% 98%  94%  Weight:   50.1 kg   Height:        Intake/Output Summary (Last 24 hours) at 11/05/2019 1025 Last data filed at 11/05/2019 0900 Gross per 24 hour  Intake 490 ml  Output 451 ml  Net 39 ml   Filed Weights   11/05/19 0459  Weight: 50.1 kg   Last Weight  Most recent update: 11/05/2019  5:00 AM   Weight  50.1 kg (110 lb 6.4 oz)           Weight change:    Telemetry    SR, no sig ectopy - Personally Reviewed  ECG    01/07 ECG is SR, HR 87, +LVH - Personally Reviewed  Physical Exam   General: frail, elderly, female appearing in no acute distress. Head: Normocephalic, atraumatic.  Neck: Supple without bruits, JVD minimal elevation. Lungs:  Resp regular and unlabored, rales R base, scattered dry rales otherwise Heart: RRR, S1, S2, no S3, S4, or murmur;  no rub. Abdomen: Soft, non-tender, non-distended with normoactive bowel sounds. No hepatomegaly. No rebound/guarding. No obvious abdominal masses. Extremities: No clubbing, cyanosis, no edema. Distal pedal pulses are 2+ bilaterally. Neuro: Alert and oriented X 3. Moves all extremities spontaneously. Psych: Normal affect.  Labs    Hematology Recent Labs  Lab 11/04/19 0341 11/04/19 0352 11/05/19 0458  WBC 21.5*  --  10.6*  RBC 3.39*  --  3.07*  HGB 9.6* 10.5* 8.8*  HCT 31.9* 31.0* 28.1*  MCV 94.1  --  91.5  MCH 28.3  --  28.7  MCHC 30.1  --  31.3  RDW 17.8*  --  17.5*  PLT 305  --  330    Chemistry Recent Labs  Lab 11/04/19 0341 11/04/19 0352 11/05/19 0458  NA 141 141 139  K 4.1 4.1 5.0  CL 110 110 106  CO2 21*  --  25  GLUCOSE 145* 137* 104*  BUN 23 23 30*  CREATININE 1.43* 1.30* 1.62*  CALCIUM 7.9*  --  8.5*  GFRNONAA 36*  --  31*  GFRAA 42*  --  36*  ANIONGAP 10  --  8     High Sensitivity Troponin:   Recent Labs  Lab 11/04/19 0341 11/04/19 1551  TROPONINIHS 38* 78*      BNP Recent Labs  Lab 11/04/19 0341  BNP 1,120.6*     DDimer No results for input(s): DDIMER in the last 168 hours.   Radiology    DG Chest Portable 1 View  Result Date: 11/04/2019 CLINICAL DATA:  Shortness of breath EXAM: PORTABLE CHEST 1 VIEW COMPARISON:  08/01/2018 FINDINGS: Unchanged mild cardiomegaly. Left-greater-than-right bibasilar opacities. Bilateral interstitial opacities, worse than on the prior study. Small left pleural effusion. IMPRESSION: Cardiomegaly, small left pleural effusion and interstitial opacities likely indicating cardiogenic pulmonary edema. Electronically Signed   By: Ulyses Jarred M.D.   On: 11/04/2019 03:19   ECHOCARDIOGRAM COMPLETE  Result Date: 11/04/2019   ECHOCARDIOGRAM REPORT   Patient Name:   [name] Date of Exam: 11/04/2019 Medical Rec #:  [id number]       Height:       64.0 in Accession #:    [id number]      Weight:       103.0 lb Date of  Birth:  03/28/1946        BSA:          1.48 m Patient Age:    57 years        BP:           154/81 mmHg Patient Gender: F               HR:           93 bpm. Exam Location:  Inpatient Procedure: 2D Echo, Cardiac Doppler and Color Doppler Indications:    I50.30* Unspecified diastolic (congestive) heart failure  History:        Patient has prior history of Echocardiogram examinations, most                 recent 03/17/2015. COPD, Signs/Symptoms:Chest Pain and Shortness                 of Breath; Risk Factors:Diabetes and Hypertension. Opiate                 overdose and suicide attempt.  Sonographer:    Roseanna Rainbow RDCS Referring Phys: [place]  1. Left ventricular ejection fraction, by visual estimation, is 60 to 65%. The left ventricle has normal function. There is moderately increased left ventricular hypertrophy.  2. Left ventricular diastolic parameters are indeterminate.  3. Global right ventricle has normal systolic function.The right ventricular size is normal.  4. Left atrial size was normal.  5. Right atrial size was normal.  6. The mitral valve is normal in structure. Trivial mitral valve regurgitation.  7. The tricuspid valve is normal in structure.  8. The aortic valve is tricuspid. Aortic valve regurgitation is not visualized. No evidence of aortic valve sclerosis or stenosis.  9. The pulmonic valve was not well visualized. Pulmonic valve regurgitation is not visualized. 10. The inferior vena cava is dilated in size with >50% respiratory variability, suggesting right atrial pressure of 8 mmHg. 11. The tricuspid regurgitant velocity is 2.53 m/s, and with an assumed right atrial pressure of 8 mmHg, the estimated right ventricular systolic pressure is mildly elevated at 33.6 mmHg. FINDINGS  Left Ventricle: Left ventricular ejection fraction, by visual estimation, is 60 to 65%. The left ventricle has normal function. The left ventricle has no regional wall motion abnormalities. The left  ventricular internal  cavity size was the left ventricle is normal in size. There is moderately increased left ventricular hypertrophy. Left ventricular diastolic parameters are indeterminate. Right Ventricle: The right ventricular size is normal. No increase in right ventricular wall thickness. Global RV systolic function is has normal systolic function. The tricuspid regurgitant velocity is 2.53 m/s, and with an assumed right atrial pressure  of 8 mmHg, the estimated right ventricular systolic pressure is mildly elevated at 33.6 mmHg. Left Atrium: Left atrial size was normal in size. Right Atrium: Right atrial size was normal in size Pericardium: There is no evidence of pericardial effusion. Mitral Valve: The mitral valve is normal in structure. Trivial mitral valve regurgitation. MV peak gradient, 13.5 mmHg. Tricuspid Valve: The tricuspid valve is normal in structure. Tricuspid valve regurgitation is trivial. Aortic Valve: The aortic valve is tricuspid. Aortic valve regurgitation is not visualized. The aortic valve is structurally normal, with no evidence of sclerosis or stenosis. Pulmonic Valve: The pulmonic valve was not well visualized. Pulmonic valve regurgitation is not visualized. Pulmonic regurgitation is not visualized. Aorta: The aortic root and ascending aorta are structurally normal, with no evidence of dilitation. Venous: The inferior vena cava is dilated in size with greater than 50% respiratory variability, suggesting right atrial pressure of 8 mmHg. IAS/Shunts: The atrial septum is grossly normal.  LEFT VENTRICLE PLAX 2D LVIDd:         3.50 cm       Diastology LVIDs:         2.40 cm       LV e' lateral:   4.03 cm/s LV PW:         1.50 cm       LV E/e' lateral: 21.6 LV IVS:        1.40 cm       LV e' medial:    5.66 cm/s LVOT diam:     1.90 cm       LV E/e' medial:  15.4 LV SV:         31 ml LV SV Index:   21.28 LVOT Area:     2.84 cm  LV Volumes (MOD) LV area d, A2C:    28.10 cm LV area d, A4C:     22.80 cm LV area s, A2C:    15.50 cm LV area s, A4C:    14.05 cm LV major d, A2C:   7.59 cm LV major d, A4C:   6.53 cm LV major s, A2C:   6.22 cm LV major s, A4C:   5.70 cm LV vol d, MOD A2C: 86.1 ml LV vol d, MOD A4C: 65.3 ml LV vol s, MOD A2C: 30.6 ml LV vol s, MOD A4C: 29.1 ml LV SV MOD A2C:     55.5 ml LV SV MOD A4C:     65.3 ml LV SV MOD BP:      49.6 ml RIGHT VENTRICLE             IVC RV S prime:     13.20 cm/s  IVC diam: 2.20 cm TAPSE (M-mode): 2.2 cm LEFT ATRIUM             Index       RIGHT ATRIUM          Index LA diam:        3.20 cm 2.17 cm/m  RA Area:     8.94 cm LA Vol (A2C):   26.3 ml 17.83 ml/m RA Volume:   17.70 ml 12.00  ml/m LA Vol (A4C):   38.0 ml 25.76 ml/m LA Biplane Vol: 31.9 ml 21.62 ml/m  AORTIC VALVE LVOT Vmax:   107.00 cm/s LVOT Vmean:  82.500 cm/s LVOT VTI:    0.235 m  AORTA Ao Root diam: 2.80 cm Ao Asc diam:  3.20 cm MITRAL VALVE                         TRICUSPID VALVE MV Area (PHT): 4.15 cm              TR Peak grad:   25.6 mmHg MV Peak grad:  13.5 mmHg             TR Vmax:        253.00 cm/s MV Mean grad:  3.0 mmHg MV Vmax:       1.84 m/s              SHUNTS MV Vmean:      79.1 cm/s             Systemic VTI:  0.24 m MV VTI:        0.36 m                Systemic Diam: 1.90 cm MV PHT:        53.07 msec MV Decel Time: 183 msec MV E velocity: 86.90 cm/s  103 cm/s MV A velocity: 141.00 cm/s 70.3 cm/s MV E/A ratio:  0.62        1.5  Oswaldo Milian MD Electronically signed by Oswaldo Milian MD Signature Date/Time: 11/04/2019/5:59:05 PM    Final      Cardiac Studies   ECHO:  11/04/2019  1. Left ventricular ejection fraction, by visual estimation, is 60 to 65%. The left ventricle has normal function. There is moderately increased left ventricular hypertrophy.  2. Left ventricular diastolic parameters are indeterminate.  3. Global right ventricle has normal systolic function.The right ventricular size is normal.  4. Left atrial size was normal.  5. Right atrial size  was normal.  6. The mitral valve is normal in structure. Trivial mitral valve regurgitation.  7. The tricuspid valve is normal in structure.  8. The aortic valve is tricuspid. Aortic valve regurgitation is not visualized. No evidence of aortic valve sclerosis or stenosis.  9. The pulmonic valve was not well visualized. Pulmonic valve regurgitation is not visualized. 10. The inferior vena cava is dilated in size with >50% respiratory variability, suggesting right atrial pressure of 8 mmHg. 11. The tricuspid regurgitant velocity is 2.53 m/s, and with an assumed right atrial pressure of 8 mmHg, the estimated right ventricular systolic pressure is mildly elevated at 33.6 mmHg.  Patient Profile     74 y.o. female w/ hx D-CHF, DM, PAD, COPD, tob use, HTN, pain issues was admitted 01/06 w/ SOB>>CHF exacerbation.  Assessment & Plan    1. Acute resp failure - hx COPD and CHF - CXR & BNP compatible w/ CHF exacerbation - got Lasix 40 mg IV x 2 doses, then d/c'd due to increased Cr - discuss diuretic rx w/ MD, RA pressure was 8 and RVSP was 33.6 on echo 01/06, may not need ongoing rx - however, pt still SOB - minimal wheeze on exam, so do not think severe COPD exacerbation - poor activity level - discuss if we need to r/o PE w/ MD  Otherwise, per MD Principal Problem:   Acute respiratory failure with hypoxia ([place]) Active Problems:  Tobacco use disorder   COPD (chronic obstructive pulmonary disease) ([facility])   Diabetes mellitus with neurological manifestation ([facility])   HTN (hypertension)   Acute kidney injury superimposed on CKD ([place])   Acute exacerbation of CHF (congestive heart failure) ([place])   DNR (do not resuscitate)   Chronic pain syndrome  Signed, Rosaria Ferries , PA-C 10:25 AM 11/05/2019 Pager: [phone]  The patient was seen, examined and discussed with Rosaria Ferries, PA-C and I agree with the above.   The patient states that her shortness of breath has not improved, no chest  pain.  On physical exam she does not seem to be fluid overloaded, she has minimal rales at lung bases but her legs have no edema.  No JVDs.  She got 2 doses of Lasix yesterday 40 mg IV and her creatinine version from 1.4-1.6.  Her creatinine was 1.0 in 2019.  Troponin was minimally elevated with flat trend, BNP was 1100.  Hemoglobin is 8.8, platelets 338. She is ill-appearing frail lady [place] lifelong smoker with no intention to quit smoking.  Her blood pressure has been significantly elevated.  Her echocardiogram shows normal LVEF with moderate concentric LVH, she has grade 1 diastolic dysfunction with mildly elevated filling pressures and no significant valvular abnormality.  Her chest x-ray shows mild CHF. Her EKG shows sinus rhythm and LVH. I would order D-dimer given the fact that her pregnant however this was acute as she is minimally active, rule out PE, negative I would recommend to consult pulmonary for further recommendations. She is hypertensive, we will increase her Toprol XL to 50 mg daily.  Ena Dawley, MD 11/05/2019

## 2019-11-05 NOTE — Evaluation (Signed)
Physical Therapy Evaluation Patient Details Name: [name] MRN: [id number] DOB: [birth year] Today's Date: 11/05/2019   History of Present Illness  Pt is 74 y.o. female with medical history significant of OSA: PVD; Parkinson's; HTN; depression; CAD; COPD; grade 1 diastolic CHF (echo in 3500); chronic pain; and CKD presenting with Shortness of breath.  Admitted with acute resp failure with associated flash pulmonary edema.  Clinical Impression  Pt admitted with above diagnosis. Pt was able to demonstrate 35' of gait with RW and moderate shortness of breath.  She was on 2 LPM O2 with sats ranging from 92-94%.  Did need minimal cues for safe transfers and gait.  Expected to progress well and have no therapy needs at d/c.  Pt currently with functional limitations due to the deficits listed below (see PT Problem List). Pt will benefit from skilled PT to increase their independence and safety with mobility to allow discharge to the venue listed below.       Follow Up Recommendations No PT follow up    Equipment Recommendations  None recommended by PT    Recommendations for Other Services       Precautions / Restrictions Precautions Precautions: Fall Restrictions Weight Bearing Restrictions: No      Mobility  Bed Mobility Overal bed mobility: Needs Assistance Bed Mobility: Supine to Sit;Sit to Supine     Supine to sit: Supervision;HOB elevated Sit to supine: Supervision;HOB elevated      Transfers Overall transfer level: Needs assistance Equipment used: Rolling walker (2 wheeled) Transfers: Sit to/from Stand Sit to Stand: Min guard            Ambulation/Gait Ambulation/Gait assistance: Counsellor (Feet): 35 Feet Assistive device: Rolling walker (2 wheeled) Gait Pattern/deviations: Step-through pattern Gait velocity: decreased   General Gait Details: min cues for RW; dyspnea of 3/4; able to navigate around obstacles in room  Stairs             Wheelchair Mobility    Modified Rankin (Stroke Patients Only)       Balance Overall balance assessment: Needs assistance Sitting-balance support: No upper extremity supported;Feet supported Sitting balance-Leahy Scale: Good     Standing balance support: Single extremity supported;During functional activity Standing balance-Leahy Scale: Fair                               Pertinent Vitals/Pain Pain Assessment: 0-10 Pain Score: 5  Pain Location: Back-chronic Pain Descriptors / Indicators: Discomfort Pain Intervention(s): Limited activity within patient's tolerance;Repositioned    Home Living Family/patient expects to be discharged to:: Private residence Living Arrangements: Other (Comment)(Friend but he is truck driver so gone frequently) Available Help at Discharge: Available PRN/intermittently Type of Home: House Home Access: Stairs to enter Entrance Stairs-Rails: Right Entrance Stairs-Number of Steps: 2 Home Layout: One level Home Equipment: Cane - single point;Walker - 2 wheels;Shower seat      Prior Function Level of Independence: Needs assistance   Gait / Transfers Assistance Needed: ambulates with cane in home and RW for short community distances  ADL's / Homemaking Assistance Needed: friend assists with some IADLs but she can prepare simple meals; aide assist with IADLs comes 6 days per week couple hours per day        Hand Dominance        Extremity/Trunk Assessment   Upper Extremity Assessment Upper Extremity Assessment: Overall WFL for tasks assessed    Lower Extremity Assessment  Lower Extremity Assessment: Overall WFL for tasks assessed    Cervical / Trunk Assessment Cervical / Trunk Assessment: Normal  Communication   Communication: No difficulties  Cognition Arousal/Alertness: Awake/alert Behavior During Therapy: WFL for tasks assessed/performed Overall Cognitive Status: Within Functional Limits for tasks assessed                                         General Comments General comments (skin integrity, edema, etc.): On 2 LPM with sats 92-94% during therapy; HR 88-94 bpm    Exercises     Assessment/Plan    PT Assessment Patient needs continued PT services  PT Problem List Decreased mobility;Decreased safety awareness;Decreased strength;Decreased activity tolerance;Cardiopulmonary status limiting activity;Decreased balance;Decreased knowledge of use of DME       PT Treatment Interventions DME instruction;Therapeutic activities;Gait training;Therapeutic exercise;Patient/family education;Stair training;Balance training;Functional mobility training    PT Goals (Current goals can be found in the Care Plan section)  Acute Rehab PT Goals Patient Stated Goal: return home PT Goal Formulation: With patient Time For Goal Achievement: 11/19/19 Potential to Achieve Goals: Good    Frequency Min 3X/week   Barriers to discharge        Co-evaluation               AM-PAC PT "6 Clicks" Mobility  Outcome Measure Help needed turning from your back to your side while in a flat bed without using bedrails?: None Help needed moving from lying on your back to sitting on the side of a flat bed without using bedrails?: None Help needed moving to and from a bed to a chair (including a wheelchair)?: None Help needed standing up from a chair using your arms (e.g., wheelchair or bedside chair)?: None Help needed to walk in hospital room?: None Help needed climbing 3-5 steps with a railing? : A Little 6 Click Score: 23    End of Session Equipment Utilized During Treatment: Gait belt;Oxygen Activity Tolerance: Patient tolerated treatment well Patient left: with chair alarm set;in chair;with call bell/phone within reach Nurse Communication: Mobility status PT Visit Diagnosis: Other abnormalities of gait and mobility (R26.89)    Time: 2130-8657 PT Time Calculation (min) (ACUTE ONLY): 24  min   Charges:   PT Evaluation $PT Eval Low Complexity: 1 Low          Maggie Font, PT Acute Rehab Services Pager [phone] [facility] [phone] [facility] [phone]   Karlton Lemon 11/05/2019, 11:39 AM

## 2019-11-05 NOTE — Progress Notes (Signed)
[facility] PROGRESS NOTE    Progress Note  PAIRLEE SAWTELL  XNT:[id number] DOB: August 25, 1946 DOA: 11/04/2019 PCP: [name], MD     Brief Narrative:   LEILI ESKENAZI is an 74 y.o. female past medical history of obstructive sleep apnea, Parkinson's disease, essential hypertension, COPD grade 1 diastolic heart failure chronic kidney disease presents with shortness of breath.  She relate sudden onset of shortness of breath that woke her up from her sleep on the day of admission feeling like short of breath especially with ambulation.  Assessment/Plan:   Acute respiratory failure with hypoxia with associated flash pulmonary edema: Last 2D echo showed diastolic heart failure, chest x-ray is compatible with pulmonary edema, she has a markedly elevated brain natruretic peptide, there is likely an acute decompensated heart failure. Continue aspirin, she was started on lisinopril on admission, continue metoprolol started on IV Lasix. The echo is that showed a preserved EF and diastolic dysfunction parameters were indeterminate. Strict I's and O's and daily weights. Will hold lisinopril due to a rise in her creatinine.  Acute kidney injury on chronic kidney disease stage IIIa: Likely due to decreased perfusion in the setting of volume overload. Check a basic metabolic panel in the morning, will decrease the rate of diuresis.  Acute Kidney injury on chronic kidney disease stage IIIa: Worsened with diuresis, hold ACE inhibitor and diuretic therapy.  Essential hypertension: Continue metoprolol, blood pressure seems to be fairly controlled.  Hyperlipidemia Continue Lipitor.  Chronic pain: Continue current medication.  Of care: She is DNR/DNI.  DVT prophylaxis: lovenox Family Communication:none Disposition Plan/Barrier to D/C: unable to determine Code Status:     Code Status Orders  (From admission, onward)         Start     Ordered   11/04/19 0943  Do not attempt  resuscitation (DNR)  Continuous    Question Answer Comment  In the event of cardiac or respiratory ARREST Do not call a "code blue"   In the event of cardiac or respiratory ARREST Do not perform Intubation, CPR, defibrillation or ACLS   In the event of cardiac or respiratory ARREST Use medication by any route, position, wound care, and other measures to relive pain and suffering. May use oxygen, suction and manual treatment of airway obstruction as needed for comfort.      11/04/19 0944        Code Status History    Date Active Date Inactive Code Status Order ID Comments User Context   08/02/2018 0001 08/04/2018 1634 Full Code [id number]  [name], MD ED   05/14/2018 2131 05/17/2018 2309 Full Code [id number]  [name], MD ED   10/10/2017 0130 10/16/2017 1644 Full Code [id number]  [name], MD ED   01/25/2016 2021 02/02/2016 1643 Full Code [id number]  [name], MD Inpatient   07/29/2015 0310 07/30/2015 1738 Full Code [id number]  [name], MD Inpatient   11/23/2011 1823 11/25/2011 1339 Full Code [id number]  [name], MD Inpatient   Advance Care Planning Activity        IV Access:    Peripheral IV   Procedures and diagnostic studies:   DG Chest Portable 1 View  Result Date: 11/04/2019 CLINICAL DATA:  Shortness of breath EXAM: PORTABLE CHEST 1 VIEW COMPARISON:  08/01/2018 FINDINGS: Unchanged mild cardiomegaly. Left-greater-than-right bibasilar opacities. Bilateral interstitial opacities, worse than on the prior study. Small left pleural effusion. IMPRESSION: Cardiomegaly, small left pleural effusion and interstitial opacities likely indicating cardiogenic  pulmonary edema. Electronically Signed   By: Ulyses Jarred M.D.   On: 11/04/2019 03:19   ECHOCARDIOGRAM COMPLETE  Result Date: 11/04/2019   ECHOCARDIOGRAM REPORT   Patient Name:   UMA JERDE Date of Exam: 11/04/2019 Medical Rec #:  [id number]       Height:       64.0 in Accession #:    [id number]      Weight:        103.0 lb Date of Birth:  [birth year]        BSA:          1.48 m Patient Age:    10 years        BP:           154/81 mmHg Patient Gender: F               HR:           93 bpm. Exam Location:  Inpatient Procedure: 2D Echo, Cardiac Doppler and Color Doppler Indications:    I50.30* Unspecified diastolic (congestive) heart failure  History:        Patient has prior history of Echocardiogram examinations, most                 recent 03/17/2015. COPD, Signs/Symptoms:Chest Pain and Shortness                 of Breath; Risk Factors:Diabetes and Hypertension. Opiate                 overdose and suicide attempt.  Sonographer:    Roseanna Rainbow RDCS Referring Phys: [place]  1. Left ventricular ejection fraction, by visual estimation, is 60 to 65%. The left ventricle has normal function. There is moderately increased left ventricular hypertrophy.  2. Left ventricular diastolic parameters are indeterminate.  3. Global right ventricle has normal systolic function.The right ventricular size is normal.  4. Left atrial size was normal.  5. Right atrial size was normal.  6. The mitral valve is normal in structure. Trivial mitral valve regurgitation.  7. The tricuspid valve is normal in structure.  8. The aortic valve is tricuspid. Aortic valve regurgitation is not visualized. No evidence of aortic valve sclerosis or stenosis.  9. The pulmonic valve was not well visualized. Pulmonic valve regurgitation is not visualized. 10. The inferior vena cava is dilated in size with >50% respiratory variability, suggesting right atrial pressure of 8 mmHg. 11. The tricuspid regurgitant velocity is 2.53 m/s, and with an assumed right atrial pressure of 8 mmHg, the estimated right ventricular systolic pressure is mildly elevated at 33.6 mmHg. FINDINGS  Left Ventricle: Left ventricular ejection fraction, by visual estimation, is 60 to 65%. The left ventricle has normal function. The left ventricle has no regional wall motion  abnormalities. The left ventricular internal cavity size was the left ventricle is normal in size. There is moderately increased left ventricular hypertrophy. Left ventricular diastolic parameters are indeterminate. Right Ventricle: The right ventricular size is normal. No increase in right ventricular wall thickness. Global RV systolic function is has normal systolic function. The tricuspid regurgitant velocity is 2.53 m/s, and with an assumed right atrial pressure  of 8 mmHg, the estimated right ventricular systolic pressure is mildly elevated at 33.6 mmHg. Left Atrium: Left atrial size was normal in size. Right Atrium: Right atrial size was normal in size Pericardium: There is no evidence of pericardial effusion. Mitral Valve: The mitral valve is normal in structure. Trivial  mitral valve regurgitation. MV peak gradient, 13.5 mmHg. Tricuspid Valve: The tricuspid valve is normal in structure. Tricuspid valve regurgitation is trivial. Aortic Valve: The aortic valve is tricuspid. Aortic valve regurgitation is not visualized. The aortic valve is structurally normal, with no evidence of sclerosis or stenosis. Pulmonic Valve: The pulmonic valve was not well visualized. Pulmonic valve regurgitation is not visualized. Pulmonic regurgitation is not visualized. Aorta: The aortic root and ascending aorta are structurally normal, with no evidence of dilitation. Venous: The inferior vena cava is dilated in size with greater than 50% respiratory variability, suggesting right atrial pressure of 8 mmHg. IAS/Shunts: The atrial septum is grossly normal.  LEFT VENTRICLE PLAX 2D LVIDd:         3.50 cm       Diastology LVIDs:         2.40 cm       LV e' lateral:   4.03 cm/s LV PW:         1.50 cm       LV E/e' lateral: 21.6 LV IVS:        1.40 cm       LV e' medial:    5.66 cm/s LVOT diam:     1.90 cm       LV E/e' medial:  15.4 LV SV:         31 ml LV SV Index:   21.28 LVOT Area:     2.84 cm  LV Volumes (MOD) LV area d, A2C:     28.10 cm LV area d, A4C:    22.80 cm LV area s, A2C:    15.50 cm LV area s, A4C:    14.05 cm LV major d, A2C:   7.59 cm LV major d, A4C:   6.53 cm LV major s, A2C:   6.22 cm LV major s, A4C:   5.70 cm LV vol d, MOD A2C: 86.1 ml LV vol d, MOD A4C: 65.3 ml LV vol s, MOD A2C: 30.6 ml LV vol s, MOD A4C: 29.1 ml LV SV MOD A2C:     55.5 ml LV SV MOD A4C:     65.3 ml LV SV MOD BP:      49.6 ml RIGHT VENTRICLE             IVC RV S prime:     13.20 cm/s  IVC diam: 2.20 cm TAPSE (M-mode): 2.2 cm LEFT ATRIUM             Index       RIGHT ATRIUM          Index LA diam:        3.20 cm 2.17 cm/m  RA Area:     8.94 cm LA Vol (A2C):   26.3 ml 17.83 ml/m RA Volume:   17.70 ml 12.00 ml/m LA Vol (A4C):   38.0 ml 25.76 ml/m LA Biplane Vol: 31.9 ml 21.62 ml/m  AORTIC VALVE LVOT Vmax:   107.00 cm/s LVOT Vmean:  82.500 cm/s LVOT VTI:    0.235 m  AORTA Ao Root diam: 2.80 cm Ao Asc diam:  3.20 cm MITRAL VALVE                         TRICUSPID VALVE MV Area (PHT): 4.15 cm              TR Peak grad:   25.6 mmHg MV Peak grad:  13.5 mmHg  TR Vmax:        253.00 cm/s MV Mean grad:  3.0 mmHg MV Vmax:       1.84 m/s              SHUNTS MV Vmean:      79.1 cm/s             Systemic VTI:  0.24 m MV VTI:        0.36 m                Systemic Diam: 1.90 cm MV PHT:        53.07 msec MV Decel Time: 183 msec MV E velocity: 86.90 cm/s  103 cm/s MV A velocity: 141.00 cm/s 70.3 cm/s MV E/A ratio:  0.62        1.5  Oswaldo Milian MD Electronically signed by Oswaldo Milian MD Signature Date/Time: 11/04/2019/5:59:05 PM    Final      Medical Consultants:    None.  Anti-Infectives:   None  Subjective:    [name] she relates her shortness of breath is improved, she still cannot lay flat to sleep.  Objective:    Vitals:   11/04/19 2032 11/05/19 0456 11/05/19 0459 11/05/19 0832  BP: (!) 156/81 (!) 171/92  124/65  Pulse: 90 92  87  Resp: 20 20    Temp: 98 F (36.7 C) 98.5 F (36.9 C)    TempSrc:  Oral Oral    SpO2: 97% 98%  94%  Weight:   50.1 kg   Height:       SpO2: 94 % O2 Flow Rate (L/min): 2 L/min FiO2 (%): (S) 60 %   Intake/Output Summary (Last 24 hours) at 11/05/2019 0851 Last data filed at 11/05/2019 0653 Gross per 24 hour  Intake 250 ml  Output 451 ml  Net -201 ml   Filed Weights   11/05/19 0459  Weight: 50.1 kg    Exam: General exam: In no acute distress. Respiratory system: Good air movement and diffuse crackles bilaterally. Cardiovascular system: S1 & S2 heard, RRR.  Positive JVD Gastrointestinal system: Abdomen is nondistended, soft and nontender.  Central nervous system: Alert and oriented. No focal neurological deficits. Extremities: No pedal edema. Skin: No rashes, lesions or ulcers Psychiatry: Judgement and insight appear normal. Mood & affect appropriate.    Data Reviewed:    Labs: Basic Metabolic Panel: Recent Labs  Lab 11/04/19 0241 11/04/19 0341 11/04/19 0352 11/05/19 0458  NA 141 141 141 139  K 4.1 4.1 4.1 5.0  CL  --  110 110 106  CO2  --  21*  --  25  GLUCOSE  --  145* 137* 104*  BUN  --  23 23 30*  CREATININE  --  1.43* 1.30* 1.62*  CALCIUM  --  7.9*  --  8.5*   GFR Estimated Creatinine Clearance: 24.1 mL/min (A) (by C-G formula based on SCr of 1.62 mg/dL (H)). Liver Function Tests: No results for input(s): AST, ALT, ALKPHOS, BILITOT, PROT, ALBUMIN in the last 168 hours. No results for input(s): LIPASE, AMYLASE in the last 168 hours. No results for input(s): AMMONIA in the last 168 hours. Coagulation profile No results for input(s): INR, PROTIME in the last 168 hours. COVID-19 Labs  No results for input(s): DDIMER, FERRITIN, LDH, CRP in the last 72 hours.  Lab Results  Component Value Date   [place] NEGATIVE 11/04/2019    CBC: Recent Labs  Lab 11/04/19 0241 11/04/19 0341 11/04/19  1638 11/05/19 0458  WBC  --  21.5*  --  10.6*  NEUTROABS  --  19.0*  --  9.0*  HGB 10.5* 9.6* 10.5* 8.8*  HCT 31.0* 31.9* 31.0*  28.1*  MCV  --  94.1  --  91.5  PLT  --  305  --  330   Cardiac Enzymes: No results for input(s): CKTOTAL, CKMB, CKMBINDEX, TROPONINI in the last 168 hours. BNP (last 3 results) No results for input(s): PROBNP in the last 8760 hours. CBG: Recent Labs  Lab 11/04/19 0342  GLUCAP 132*   D-Dimer: No results for input(s): DDIMER in the last 72 hours. Hgb A1c: No results for input(s): HGBA1C in the last 72 hours. Lipid Profile: Recent Labs    11/05/19 0458  CHOL 145  HDL 64  LDLCALC 63  TRIG 90  CHOLHDL 2.3   Thyroid function studies: Recent Labs    11/04/19 1125  TSH 0.401   Anemia work up: No results for input(s): VITAMINB12, FOLATE, FERRITIN, TIBC, IRON, RETICCTPCT in the last 72 hours. Sepsis Labs: Recent Labs  Lab 11/04/19 0341 11/05/19 0458  WBC 21.5* 10.6*   Microbiology Recent Results (from the past 240 hour(s))  SARS CORONAVIRUS 2 (TAT 6-24 HRS) Nasopharyngeal Nasopharyngeal Swab     Status: None   Collection Time: 11/04/19  2:31 AM   Specimen: Nasopharyngeal Swab  Result Value Ref Range Status   SARS Coronavirus 2 NEGATIVE NEGATIVE Final    Comment: (NOTE) SARS-CoV-2 target nucleic acids are NOT DETECTED. The SARS-CoV-2 RNA is generally detectable in upper and lower respiratory specimens during the acute phase of infection. Negative results do not preclude SARS-CoV-2 infection, do not rule out co-infections with other pathogens, and should not be used as the sole basis for treatment or other patient management decisions. Negative results must be combined with clinical observations, patient history, and epidemiological information. The expected result is Negative. Fact Sheet for Patients: SugarRoll.be Fact Sheet for Healthcare Providers: https://www.woods-mathews.com/ This test is not yet approved or cleared by the [place] FDA and  has been authorized for detection and/or diagnosis of SARS-CoV-2 by FDA  under an Emergency Use Authorization (EUA). This EUA will remain  in effect (meaning this test can be used) for the duration of the COVID-19 declaration under Section 56 4(b)(1) of the Act, 21 U.S.C. section 360bbb-3(b)(1), unless the authorization is terminated or revoked sooner. Performed at [facility], [place] [address], [place], [place] [address]      Medications:   . allopurinol  300 mg Oral Daily  . amLODipine  10 mg Oral Daily  . aspirin  81 mg Oral Daily  . atorvastatin  20 mg Oral QHS  . DULoxetine  20 mg Oral Daily  . DULoxetine  60 mg Oral BID  . enoxaparin (LOVENOX) injection  30 mg Subcutaneous Q24H  . metoprolol succinate  12.5 mg Oral Daily  . multivitamin with minerals  1 tablet Oral Daily  . nicotine  14 mg Transdermal Daily  . pantoprazole  40 mg Oral BID  . sodium chloride flush  3 mL Intravenous Q12H   Continuous Infusions: . sodium chloride        LOS: 1 day   Charlynne Cousins  [facility]  11/05/2019, 8:51 AM

## 2019-11-06 ENCOUNTER — Inpatient Hospital Stay (HOSPITAL_COMMUNITY): Payer: Medicare Other

## 2019-11-06 DIAGNOSIS — N1831 Chronic kidney disease, stage 3a: Secondary | ICD-10-CM

## 2019-11-06 DIAGNOSIS — N171 Acute kidney failure with acute cortical necrosis: Secondary | ICD-10-CM

## 2019-11-06 DIAGNOSIS — R7989 Other specified abnormal findings of blood chemistry: Secondary | ICD-10-CM

## 2019-11-06 DIAGNOSIS — J810 Acute pulmonary edema: Secondary | ICD-10-CM

## 2019-11-06 DIAGNOSIS — J449 Chronic obstructive pulmonary disease, unspecified: Secondary | ICD-10-CM

## 2019-11-06 LAB — CBC WITH DIFFERENTIAL/PLATELET
Abs Immature Granulocytes: 0.04 10*3/uL (ref 0.00–0.07)
Basophils Absolute: 0 10*3/uL (ref 0.0–0.1)
Basophils Relative: 0 %
Eosinophils Absolute: 0.1 10*3/uL (ref 0.0–0.5)
Eosinophils Relative: 1 %
HCT: 31.4 % — ABNORMAL LOW (ref 36.0–46.0)
Hemoglobin: 9.5 g/dL — ABNORMAL LOW (ref 12.0–15.0)
Immature Granulocytes: 0 %
Lymphocytes Relative: 7 %
Lymphs Abs: 0.8 10*3/uL (ref 0.7–4.0)
MCH: 28.5 pg (ref 26.0–34.0)
MCHC: 30.3 g/dL (ref 30.0–36.0)
MCV: 94.3 fL (ref 80.0–100.0)
Monocytes Absolute: 0.6 10*3/uL (ref 0.1–1.0)
Monocytes Relative: 5 %
Neutro Abs: 10.3 10*3/uL — ABNORMAL HIGH (ref 1.7–7.7)
Neutrophils Relative %: 87 %
Platelets: 384 10*3/uL (ref 150–400)
RBC: 3.33 MIL/uL — ABNORMAL LOW (ref 3.87–5.11)
RDW: 17.5 % — ABNORMAL HIGH (ref 11.5–15.5)
WBC: 11.9 10*3/uL — ABNORMAL HIGH (ref 4.0–10.5)
nRBC: 0 % (ref 0.0–0.2)

## 2019-11-06 LAB — BASIC METABOLIC PANEL
Anion gap: 11 (ref 5–15)
BUN: 28 mg/dL — ABNORMAL HIGH (ref 8–23)
CO2: 23 mmol/L (ref 22–32)
Calcium: 8.2 mg/dL — ABNORMAL LOW (ref 8.9–10.3)
Chloride: 101 mmol/L (ref 98–111)
Creatinine, Ser: 1.56 mg/dL — ABNORMAL HIGH (ref 0.44–1.00)
GFR calc Af Amer: 38 mL/min — ABNORMAL LOW (ref >60)
GFR calc non Af Amer: 32 mL/min — ABNORMAL LOW (ref >60)
Glucose, Bld: 93 mg/dL (ref 70–99)
Potassium: 4.5 mmol/L (ref 3.5–5.1)
Sodium: 135 mmol/L (ref 135–145)

## 2019-11-06 MED ORDER — ALBUTEROL SULFATE (2.5 MG/3ML) 0.083% IN NEBU
3.0000 mL | INHALATION_SOLUTION | Freq: Four times a day (QID) | RESPIRATORY_TRACT | Status: DC
  Administered 2019-11-06: 3 mL via RESPIRATORY_TRACT
  Filled 2019-11-06: qty 3

## 2019-11-06 MED ORDER — DOXYCYCLINE HYCLATE 100 MG PO TABS
100.0000 mg | ORAL_TABLET | Freq: Two times a day (BID) | ORAL | Status: DC
  Administered 2019-11-06 – 2019-11-09 (×7): 100 mg via ORAL
  Filled 2019-11-06 (×7): qty 1

## 2019-11-06 MED ORDER — METHYLPREDNISOLONE SODIUM SUCC 40 MG IJ SOLR
40.0000 mg | Freq: Two times a day (BID) | INTRAMUSCULAR | Status: DC
  Administered 2019-11-06 – 2019-11-09 (×7): 40 mg via INTRAVENOUS
  Filled 2019-11-06 (×8): qty 1

## 2019-11-06 MED ORDER — TECHNETIUM TO 99M ALBUMIN AGGREGATED
1.5200 | Freq: Once | INTRAVENOUS | Status: AC | PRN
  Administered 2019-11-06: 1.52 via INTRAVENOUS

## 2019-11-06 MED ORDER — ALBUTEROL SULFATE (2.5 MG/3ML) 0.083% IN NEBU
3.0000 mL | INHALATION_SOLUTION | Freq: Two times a day (BID) | RESPIRATORY_TRACT | Status: DC
  Administered 2019-11-06 – 2019-11-09 (×6): 3 mL via RESPIRATORY_TRACT
  Filled 2019-11-06 (×6): qty 3

## 2019-11-06 MED ORDER — TIOTROPIUM BROMIDE MONOHYDRATE 18 MCG IN CAPS: 18.0000 ug | ORAL_CAPSULE | Freq: Every day | RESPIRATORY_TRACT | Status: DC

## 2019-11-06 MED ORDER — UMECLIDINIUM BROMIDE 62.5 MCG/INH IN AEPB
1.0000 | INHALATION_SPRAY | Freq: Every day | RESPIRATORY_TRACT | Status: DC
  Administered 2019-11-07 – 2019-11-09 (×3): 1 via RESPIRATORY_TRACT
  Filled 2019-11-06: qty 7

## 2019-11-06 MED ORDER — ALBUTEROL SULFATE (2.5 MG/3ML) 0.083% IN NEBU: 3.0000 mL | INHALATION_SOLUTION | RESPIRATORY_TRACT | Status: DC | PRN

## 2019-11-06 NOTE — Plan of Care (Signed)
  Problem: Clinical Measurements: Goal: Respiratory complications will improve Outcome: Progressing   Problem: Activity: Goal: Risk for activity intolerance will decrease Outcome: Progressing   

## 2019-11-06 NOTE — Progress Notes (Signed)
[facility] PROGRESS NOTE    Progress Note  [name]  PRF:[id number] DOB: 19-Sep-1946 DOA: 11/04/2019 PCP: [name], MD     Brief Narrative:   [name] is an 74 y.o. female past medical history of obstructive sleep apnea, Parkinson's disease, essential hypertension, COPD grade 1 diastolic heart failure chronic kidney disease presents with shortness of breath.  She relate sudden onset of shortness of breath that woke her up from her sleep on the day of admission feeling like short of breath especially with ambulation.  Assessment/Plan:   Acute respiratory failure with hypoxia with associated flash pulmonary edema/acute diastolic heart failure and possibly acute COPD exacerbation Wean off oxygen, continue IV diuresis, she is only negative about 800 cc, will limit her fluid intake. Continue strict I's and O's and daily weights. Continue to monitor creatinine and potassium closely. Stop IV Lasix. We'll start her on steroids, antibiotics and inhalers she really seems to be wheezing today. She has remained afebrile she has a mild leukocytosis of 12. We'll start her Spiriva. We'll have physical therapy evaluate her.  Chronic kidney disease stage IIIa: With a baseline creatinine of 1.2-1.4, she is not in acute renal failure.  Essential hypertension: Blood pressure seems to be fairly controlled continue metoprolol, will restart lisinopril tomorrow morning.  Hyperlipidemia Continue Lipitor.  Chronic pain: Continue current medication.  Ethics: She is DNR/DNI.  DVT prophylaxis: lovenox Family Communication:none Disposition Plan/Barrier to D/C: unable to determine Code Status:     Code Status Orders  (From admission, onward)         Start     Ordered   11/04/19 0943  Do not attempt resuscitation (DNR)  Continuous    Question Answer Comment  In the event of cardiac or respiratory ARREST Do not call a "code blue"   In the event of cardiac or  respiratory ARREST Do not perform Intubation, CPR, defibrillation or ACLS   In the event of cardiac or respiratory ARREST Use medication by any route, position, wound care, and other measures to relive pain and suffering. May use oxygen, suction and manual treatment of airway obstruction as needed for comfort.      11/04/19 0944        Code Status History    Date Active Date Inactive Code Status Order ID Comments User Context   08/02/2018 0001 08/04/2018 1634 Full Code [id number]  [name], MD ED   05/14/2018 2131 05/17/2018 2309 Full Code [id number]  [name], MD ED   10/10/2017 0130 10/16/2017 1644 Full Code [id number]  [name], MD ED   01/25/2016 2021 02/02/2016 1643 Full Code [id number]  [name], MD Inpatient   07/29/2015 0310 07/30/2015 1738 Full Code [id number]  [name], MD Inpatient   11/23/2011 1823 11/25/2011 1339 Full Code [id number]  [name], MD Inpatient   Advance Care Planning Activity        IV Access:    Peripheral IV   Procedures and diagnostic studies:   ECHOCARDIOGRAM COMPLETE  Result Date: 11/04/2019   ECHOCARDIOGRAM REPORT   Patient Name:   [name] Date of Exam: 11/04/2019 Medical Rec #:  [id number]       Height:       64.0 in Accession #:    [id number]      Weight:       103.0 lb Date of Birth:  [birth year]        BSA:  1.48 m Patient Age:    67 years        BP:           154/81 mmHg Patient Gender: F               HR:           93 bpm. Exam Location:  Inpatient Procedure: 2D Echo, Cardiac Doppler and Color Doppler Indications:    I50.30* Unspecified diastolic (congestive) heart failure  History:        Patient has prior history of Echocardiogram examinations, most                 recent 03/17/2015. COPD, Signs/Symptoms:Chest Pain and Shortness                 of Breath; Risk Factors:Diabetes and Hypertension. Opiate                 overdose and suicide attempt.  Sonographer:    Roseanna Rainbow RDCS Referring Phys: [place]  1. Left ventricular ejection fraction, by visual estimation, is 60 to 65%. The left ventricle has normal function. There is moderately increased left ventricular hypertrophy.  2. Left ventricular diastolic parameters are indeterminate.  3. Global right ventricle has normal systolic function.The right ventricular size is normal.  4. Left atrial size was normal.  5. Right atrial size was normal.  6. The mitral valve is normal in structure. Trivial mitral valve regurgitation.  7. The tricuspid valve is normal in structure.  8. The aortic valve is tricuspid. Aortic valve regurgitation is not visualized. No evidence of aortic valve sclerosis or stenosis.  9. The pulmonic valve was not well visualized. Pulmonic valve regurgitation is not visualized. 10. The inferior vena cava is dilated in size with >50% respiratory variability, suggesting right atrial pressure of 8 mmHg. 11. The tricuspid regurgitant velocity is 2.53 m/s, and with an assumed right atrial pressure of 8 mmHg, the estimated right ventricular systolic pressure is mildly elevated at 33.6 mmHg. FINDINGS  Left Ventricle: Left ventricular ejection fraction, by visual estimation, is 60 to 65%. The left ventricle has normal function. The left ventricle has no regional wall motion abnormalities. The left ventricular internal cavity size was the left ventricle is normal in size. There is moderately increased left ventricular hypertrophy. Left ventricular diastolic parameters are indeterminate. Right Ventricle: The right ventricular size is normal. No increase in right ventricular wall thickness. Global RV systolic function is has normal systolic function. The tricuspid regurgitant velocity is 2.53 m/s, and with an assumed right atrial pressure  of 8 mmHg, the estimated right ventricular systolic pressure is mildly elevated at 33.6 mmHg. Left Atrium: Left atrial size was normal in size. Right Atrium: Right atrial size was normal in size Pericardium:  There is no evidence of pericardial effusion. Mitral Valve: The mitral valve is normal in structure. Trivial mitral valve regurgitation. MV peak gradient, 13.5 mmHg. Tricuspid Valve: The tricuspid valve is normal in structure. Tricuspid valve regurgitation is trivial. Aortic Valve: The aortic valve is tricuspid. Aortic valve regurgitation is not visualized. The aortic valve is structurally normal, with no evidence of sclerosis or stenosis. Pulmonic Valve: The pulmonic valve was not well visualized. Pulmonic valve regurgitation is not visualized. Pulmonic regurgitation is not visualized. Aorta: The aortic root and ascending aorta are structurally normal, with no evidence of dilitation. Venous: The inferior vena cava is dilated in size with greater than 50% respiratory variability, suggesting right atrial pressure of  8 mmHg. IAS/Shunts: The atrial septum is grossly normal.  LEFT VENTRICLE PLAX 2D LVIDd:         3.50 cm       Diastology LVIDs:         2.40 cm       LV e' lateral:   4.03 cm/s LV PW:         1.50 cm       LV E/e' lateral: 21.6 LV IVS:        1.40 cm       LV e' medial:    5.66 cm/s LVOT diam:     1.90 cm       LV E/e' medial:  15.4 LV SV:         31 ml LV SV Index:   21.28 LVOT Area:     2.84 cm  LV Volumes (MOD) LV area d, A2C:    28.10 cm LV area d, A4C:    22.80 cm LV area s, A2C:    15.50 cm LV area s, A4C:    14.05 cm LV major d, A2C:   7.59 cm LV major d, A4C:   6.53 cm LV major s, A2C:   6.22 cm LV major s, A4C:   5.70 cm LV vol d, MOD A2C: 86.1 ml LV vol d, MOD A4C: 65.3 ml LV vol s, MOD A2C: 30.6 ml LV vol s, MOD A4C: 29.1 ml LV SV MOD A2C:     55.5 ml LV SV MOD A4C:     65.3 ml LV SV MOD BP:      49.6 ml RIGHT VENTRICLE             IVC RV S prime:     13.20 cm/s  IVC diam: 2.20 cm TAPSE (M-mode): 2.2 cm LEFT ATRIUM             Index       RIGHT ATRIUM          Index LA diam:        3.20 cm 2.17 cm/m  RA Area:     8.94 cm LA Vol (A2C):   26.3 ml 17.83 ml/m RA Volume:   17.70 ml 12.00  ml/m LA Vol (A4C):   38.0 ml 25.76 ml/m LA Biplane Vol: 31.9 ml 21.62 ml/m  AORTIC VALVE LVOT Vmax:   107.00 cm/s LVOT Vmean:  82.500 cm/s LVOT VTI:    0.235 m  AORTA Ao Root diam: 2.80 cm Ao Asc diam:  3.20 cm MITRAL VALVE                         TRICUSPID VALVE MV Area (PHT): 4.15 cm              TR Peak grad:   25.6 mmHg MV Peak grad:  13.5 mmHg             TR Vmax:        253.00 cm/s MV Mean grad:  3.0 mmHg MV Vmax:       1.84 m/s              SHUNTS MV Vmean:      79.1 cm/s             Systemic VTI:  0.24 m MV VTI:        0.36 m                Systemic  Diam: 1.90 cm MV PHT:        53.07 msec MV Decel Time: 183 msec MV E velocity: 86.90 cm/s  103 cm/s MV A velocity: 141.00 cm/s 70.3 cm/s MV E/A ratio:  0.62        1.5  Oswaldo Milian MD Electronically signed by Oswaldo Milian MD Signature Date/Time: 11/04/2019/5:59:05 PM    Final      Medical Consultants:    None.  Anti-Infectives:   None  Subjective:    [name] she relates her shortness of breath is unchanged compared to yesterday.  Objective:    Vitals:   11/05/19 2127 11/06/19 0129 11/06/19 0425 11/06/19 0750  BP: (!) 142/81 (!) 159/91 (!) 156/76 (!) 174/91  Pulse: 84 80 85 85  Resp: 20 20 20 20   Temp: 98.5 F (36.9 C) 98.8 F (37.1 C) 98.9 F (37.2 C) 98.4 F (36.9 C)  TempSrc: Oral Oral Oral Oral  SpO2: 92% 97% 94% 90%  Weight:   48.7 kg   Height:       SpO2: 90 % O2 Flow Rate (L/min): 3 L/min FiO2 (%): (S) 60 %   Intake/Output Summary (Last 24 hours) at 11/06/2019 0807 Last data filed at 11/06/2019 3474 Gross per 24 hour  Intake 680 ml  Output 1400 ml  Net -720 ml   Filed Weights   11/05/19 0459 11/06/19 0425  Weight: 50.1 kg 48.7 kg    Exam: General exam: In no acute distress. Respiratory system: Good air movement and wheezing bilaterally with crackles at bases. Cardiovascular system: S1 & S2 heard, RRR. No JVD. Gastrointestinal system: Abdomen is nondistended, soft and nontender.    Central nervous system: Alert and oriented. No focal neurological deficits. Extremities: No pedal edema. Skin: No rashes, lesions or ulcers Psychiatry: Judgement and insight appear normal. Mood & affect appropriate.    Data Reviewed:    Labs: Basic Metabolic Panel: Recent Labs  Lab 11/04/19 0241 11/04/19 0341 11/04/19 0352 11/05/19 0458 11/06/19 0528  NA 141 141 141 139 135  K 4.1 4.1 4.1 5.0 4.5  CL  --  110 110 106 101  CO2  --  21*  --  25 23  GLUCOSE  --  145* 137* 104* 93  BUN  --  23 23 30* 28*  CREATININE  --  1.43* 1.30* 1.62* 1.56*  CALCIUM  --  7.9*  --  8.5* 8.2*   GFR Estimated Creatinine Clearance: 24.3 mL/min (A) (by C-G formula based on SCr of 1.56 mg/dL (H)). Liver Function Tests: No results for input(s): AST, ALT, ALKPHOS, BILITOT, PROT, ALBUMIN in the last 168 hours. No results for input(s): LIPASE, AMYLASE in the last 168 hours. No results for input(s): AMMONIA in the last 168 hours. Coagulation profile No results for input(s): INR, PROTIME in the last 168 hours. COVID-19 Labs  Recent Labs    11/05/19 1705  DDIMER 1.66*    Lab Results  Component Value Date   SARSCOV2NAA NEGATIVE 11/04/2019    CBC: Recent Labs  Lab 11/04/19 0241 11/04/19 0341 11/04/19 0352 11/05/19 0458 11/06/19 0528  WBC  --  21.5*  --  10.6* 11.9*  NEUTROABS  --  19.0*  --  9.0* 10.3*  HGB 10.5* 9.6* 10.5* 8.8* 9.5*  HCT 31.0* 31.9* 31.0* 28.1* 31.4*  MCV  --  94.1  --  91.5 94.3  PLT  --  305  --  330 384   Cardiac Enzymes: No results for input(s): CKTOTAL, CKMB, CKMBINDEX,  TROPONINI in the last 168 hours. BNP (last 3 results) No results for input(s): PROBNP in the last 8760 hours. CBG: Recent Labs  Lab 11/04/19 0342  GLUCAP 132*   D-Dimer: Recent Labs    11/05/19 1705  DDIMER 1.66*   Hgb A1c: No results for input(s): HGBA1C in the last 72 hours. Lipid Profile: Recent Labs    11/05/19 0458  CHOL 145  HDL 64  LDLCALC 63  TRIG 90  CHOLHDL  2.3   Thyroid function studies: Recent Labs    11/04/19 1125  TSH 0.401   Anemia work up: No results for input(s): VITAMINB12, FOLATE, FERRITIN, TIBC, IRON, RETICCTPCT in the last 72 hours. Sepsis Labs: Recent Labs  Lab 11/04/19 0341 11/05/19 0458 11/06/19 0528  WBC 21.5* 10.6* 11.9*   Microbiology Recent Results (from the past 240 hour(s))  SARS CORONAVIRUS 2 (TAT 6-24 HRS) Nasopharyngeal Nasopharyngeal Swab     Status: None   Collection Time: 11/04/19  2:31 AM   Specimen: Nasopharyngeal Swab  Result Value Ref Range Status   SARS Coronavirus 2 NEGATIVE NEGATIVE Final    Comment: (NOTE) SARS-CoV-2 target nucleic acids are NOT DETECTED. The SARS-CoV-2 RNA is generally detectable in upper and lower respiratory specimens during the acute phase of infection. Negative results do not preclude SARS-CoV-2 infection, do not rule out co-infections with other pathogens, and should not be used as the sole basis for treatment or other patient management decisions. Negative results must be combined with clinical observations, patient history, and epidemiological information. The expected result is Negative. Fact Sheet for Patients: SugarRoll.be Fact Sheet for Healthcare Providers: https://www.woods-mathews.com/ This test is not yet approved or cleared by the [place] FDA and  has been authorized for detection and/or diagnosis of SARS-CoV-2 by FDA under an Emergency Use Authorization (EUA). This EUA will remain  in effect (meaning this test can be used) for the duration of the COVID-19 declaration under Section 56 4(b)(1) of the Act, 21 U.S.C. section 360bbb-3(b)(1), unless the authorization is terminated or revoked sooner. Performed at [facility], [place] [address] [place], [place] [address]   Blood culture (routine x 2)     Status: None (Preliminary result)   Collection Time: 11/04/19  6:10 AM   Specimen: BLOOD RIGHT WRIST   Result Value Ref Range Status   Specimen Description BLOOD RIGHT WRIST  Final   Special Requests   Final    BOTTLES DRAWN AEROBIC AND ANAEROBIC Blood Culture results may not be optimal due to an inadequate volume of blood received in culture bottles   Culture   Final    NO GROWTH 1 DAY Performed at [facility], [place] [address] [place], [place] [address]    Report Status PENDING  Incomplete  Blood culture (routine x 2)     Status: None (Preliminary result)   Collection Time: 11/04/19  6:13 AM   Specimen: BLOOD RIGHT HAND  Result Value Ref Range Status   Specimen Description BLOOD RIGHT HAND  Final   Special Requests   Final    BOTTLES DRAWN AEROBIC AND ANAEROBIC Blood Culture results may not be optimal due to an inadequate volume of blood received in culture bottles   Culture   Final    NO GROWTH 1 DAY Performed at [facility], [place] [address] [place], [place] [address]    Report Status PENDING  Incomplete  Urine culture     Status: None   Collection Time: 11/04/19  9:11 AM  Specimen: Urine, Random  Result Value Ref Range Status   Specimen Description URINE, RANDOM  Final   Special Requests NONE  Final   Culture   Final    NO GROWTH Performed at [facility], [address] [place], [place] [address]    Report Status 11/05/2019 FINAL  Final  MRSA PCR Screening     Status: None   Collection Time: 11/05/19  8:58 PM   Specimen: Nasal Mucosa; Nasopharyngeal  Result Value Ref Range Status   MRSA by PCR NEGATIVE NEGATIVE Final    Comment:        The GeneXpert MRSA Assay (FDA approved for NASAL specimens only), is one component of a comprehensive MRSA colonization surveillance program. It is not intended to diagnose MRSA infection nor to guide or monitor treatment for MRSA infections. Performed at [facility], [place] [address], [place],  [address]      Medications:   . allopurinol  300 mg Oral Daily  . amLODipine  10 mg Oral Daily  .  aspirin  81 mg Oral Daily  . atorvastatin  20 mg Oral QHS  . DULoxetine  20 mg Oral Daily  . DULoxetine  60 mg Oral BID  . enoxaparin (LOVENOX) injection  30 mg Subcutaneous Q24H  . metoprolol succinate  50 mg Oral Daily  . multivitamin with minerals  1 tablet Oral Daily  . nicotine  14 mg Transdermal Daily  . pantoprazole  40 mg Oral BID  . sodium chloride flush  3 mL Intravenous Q12H   Continuous Infusions: . sodium chloride        LOS: 2 days   Charlynne Cousins  [facility]  11/06/2019, 8:07 AM

## 2019-11-06 NOTE — Progress Notes (Signed)
Patient requests something for constipation, please advise.

## 2019-11-06 NOTE — Progress Notes (Signed)
Progress Note  Patient Name: [name] Date of Encounter: 11/06/2019  Primary Cardiologist: Lynn Dawley, MD   Subjective   She states that her shortness of breath is slightly better today.  Inpatient Medications    Scheduled Meds: . albuterol  3 mL Inhalation BID  . allopurinol  300 mg Oral Daily  . amLODipine  10 mg Oral Daily  . aspirin  81 mg Oral Daily  . atorvastatin  20 mg Oral QHS  . doxycycline  100 mg Oral Q12H  . DULoxetine  20 mg Oral Daily  . DULoxetine  60 mg Oral BID  . enoxaparin (LOVENOX) injection  30 mg Subcutaneous Q24H  . methylPREDNISolone (SOLU-MEDROL) injection  40 mg Intravenous Q12H  . metoprolol succinate  50 mg Oral Daily  . multivitamin with minerals  1 tablet Oral Daily  . nicotine  14 mg Transdermal Daily  . pantoprazole  40 mg Oral BID  . sodium chloride flush  3 mL Intravenous Q12H  . umeclidinium bromide  1 puff Inhalation Daily   Continuous Infusions: . sodium chloride     PRN Meds: sodium chloride, acetaminophen, albuterol, ondansetron (ZOFRAN) IV, oxyCODONE-acetaminophen, sodium chloride flush   Vital Signs    Vitals:   11/06/19 0750 11/06/19 0816 11/06/19 0934 11/06/19 1124  BP: (!) 174/91 (!) 154/79  136/77  Pulse: 85 81 78 75  Resp: 20  18 18   Temp: 98.4 F (36.9 C)   98.3 F (36.8 C)  TempSrc: Oral   Oral  SpO2: 90% (!) 89% 93% 94%  Weight:      Height:        Intake/Output Summary (Last 24 hours) at 11/06/2019 1143 Last data filed at 11/06/2019 0826 Gross per 24 hour  Intake 540 ml  Output 1400 ml  Net -860 ml   Last 3 Weights 11/06/2019 11/05/2019 08/02/2018  Weight (lbs) 107 lb 4.8 oz 110 lb 6.4 oz 102 lb 15.3 oz  Weight (kg) 48.671 kg 50.077 kg 46.7 kg      Telemetry    SR - Personally Reviewed  ECG    No new tracing - Personally Reviewed  Physical Exam   GEN: No acute distress.   Neck: No JVD Cardiac: RRR, no murmurs, rubs, or gallops.  Respiratory: mild end exspiratory wheezing. GI: Soft,  nontender, non-distended  MS: No edema; No deformity. Neuro:  Nonfocal  Psych: Normal affect   Labs    High Sensitivity Troponin:   Recent Labs  Lab 11/04/19 0341 11/04/19 1551  TROPONINIHS 38* 78*      Chemistry Recent Labs  Lab 11/04/19 0341 11/04/19 0352 11/05/19 0458 11/06/19 0528  NA 141 141 139 135  K 4.1 4.1 5.0 4.5  CL 110 110 106 101  CO2 21*  --  25 23  GLUCOSE 145* 137* 104* 93  BUN 23 23 30* 28*  CREATININE 1.43* 1.30* 1.62* 1.56*  CALCIUM 7.9*  --  8.5* 8.2*  GFRNONAA 36*  --  31* 32*  GFRAA 42*  --  36* 38*  ANIONGAP 10  --  8 11     Hematology Recent Labs  Lab 11/04/19 0341 11/04/19 0352 11/05/19 0458 11/06/19 0528  WBC 21.5*  --  10.6* 11.9*  RBC 3.39*  --  3.07* 3.33*  HGB 9.6* 10.5* 8.8* 9.5*  HCT 31.9* 31.0* 28.1* 31.4*  MCV 94.1  --  91.5 94.3  MCH 28.3  --  28.7 28.5  MCHC 30.1  --  31.3 30.3  RDW 17.8*  --  17.5* 17.5*  PLT 305  --  330 384    BNP Recent Labs  Lab 11/04/19 0341  BNP 1,120.6*     DDimer  Recent Labs  Lab 11/05/19 1705  DDIMER 1.66*     Cardiac Studies   Echocardiogram November 03, 2018  1. Left ventricular ejection fraction, by visual estimation, is 60 to 65%. The left ventricle has normal function. There is moderately increased left ventricular hypertrophy.  2. Left ventricular diastolic parameters are indeterminate.  3. Global right ventricle has normal systolic function.The right ventricular size is normal.  4. Left atrial size was normal.  5. Right atrial size was normal.  6. The mitral valve is normal in structure. Trivial mitral valve regurgitation.  7. The tricuspid valve is normal in structure.  8. The aortic valve is tricuspid. Aortic valve regurgitation is not visualized. No evidence of aortic valve sclerosis or stenosis.  9. The pulmonic valve was not well visualized. Pulmonic valve regurgitation is not visualized. 10. The inferior vena cava is dilated in size with >50% respiratory  variability, suggesting right atrial pressure of 8 mmHg. 11. The tricuspid regurgitant velocity is 2.53 m/s, and with an assumed right atrial pressure of 8 mmHg, the estimated right ventricular systolic pressure is mildly elevated at 33.6 mmHg.   Patient Profile     74 y.o. female   [place]    Otherwise, per MD Principal Problem:   Acute respiratory failure with hypoxia ([place]) Active Problems:   Tobacco use disorder   Acute COPD exacerbation (chronic obstructive pulmonary disease) ([facility])   Diabetes mellitus with neurological manifestation ([facility])   HTN (hypertension)   Acute kidney injury superimposed on CKD ([place])   Acute exacerbation of CHF (congestive heart failure) ([place])   DNR (do not resuscitate)   Chronic pain syndrome  Acute resp failure - hx COPD and CHF - CXR & BNP compatible w/ CHF exacerbation - got Lasix 40 mg IV x 2 doses, then d/c'd due to increased Cr - she appears euvolemic, now mild wheezing, she is being treated for acute COPR exacerbation by the primary team - Crea has improved 1.62->1.56, we will continue to hold diuretics - abnormal D dimer, I will obtain V/Q scan   For questions or updates, please contact [facility] Please consult [email or web address] for contact info under    Signed, Lynn Dawley, MD  11/06/2019, 11:43 AM

## 2019-11-06 NOTE — Progress Notes (Signed)
  Mobility Specialist Criteria Algorithm Info.  SATURATION QUALIFICATIONS: (This note is used to comply with regulatory documentation for home oxygen)  Patient Saturations on Room Air at Rest = 96%  Patient Saturations on Room Air while Ambulating = 87%  Patient Saturations on 2 Liters of oxygen while Ambulating = 94%  Please briefly explain why patient needs home oxygen:  Mobility:  HOB elevated:Self regulated Activity: Ambulated in hall Range of motion: Active;All extremities Level of assistance: Standby assist, set-up cues, supervision of patient - no hands on Assistive device: Front wheel walker Minutes stood: 4 minutes Minutes ambulated: 4 minutes Distance ambulated (ft): 30 ft Mobility response: Tolerated fair;RN notified (c/o legs feeling weak and "shaky" requiring seated rest. Denied dizziness, lightheadedness, SOB, or pain.) Bed Position: Semi-fowlers   11/06/2019 12:22 PM

## 2019-11-06 NOTE — Progress Notes (Signed)
Patient O2 sats went to 88%, increased oxygen from 1L to 3L. O2 sats were then WNL. O2 sats were again 88% and RN told patient to take some deep breaths O2 sats up to 95%.

## 2019-11-07 LAB — CBC WITH DIFFERENTIAL/PLATELET
Abs Immature Granulocytes: 0.02 10*3/uL (ref 0.00–0.07)
Basophils Absolute: 0 10*3/uL (ref 0.0–0.1)
Basophils Relative: 0 %
Eosinophils Absolute: 0 10*3/uL (ref 0.0–0.5)
Eosinophils Relative: 0 %
HCT: 31.8 % — ABNORMAL LOW (ref 36.0–46.0)
Hemoglobin: 9.8 g/dL — ABNORMAL LOW (ref 12.0–15.0)
Immature Granulocytes: 0 %
Lymphocytes Relative: 3 %
Lymphs Abs: 0.2 10*3/uL — ABNORMAL LOW (ref 0.7–4.0)
MCH: 28.2 pg (ref 26.0–34.0)
MCHC: 30.8 g/dL (ref 30.0–36.0)
MCV: 91.6 fL (ref 80.0–100.0)
Monocytes Absolute: 0.1 10*3/uL (ref 0.1–1.0)
Monocytes Relative: 1 %
Neutro Abs: 7.6 10*3/uL (ref 1.7–7.7)
Neutrophils Relative %: 96 %
Platelets: 350 10*3/uL (ref 150–400)
RBC: 3.47 MIL/uL — ABNORMAL LOW (ref 3.87–5.11)
RDW: 17.1 % — ABNORMAL HIGH (ref 11.5–15.5)
WBC: 8 10*3/uL (ref 4.0–10.5)
nRBC: 0 % (ref 0.0–0.2)

## 2019-11-07 LAB — BASIC METABOLIC PANEL
Anion gap: 12 (ref 5–15)
BUN: 27 mg/dL — ABNORMAL HIGH (ref 8–23)
CO2: 22 mmol/L (ref 22–32)
Calcium: 8.3 mg/dL — ABNORMAL LOW (ref 8.9–10.3)
Chloride: 102 mmol/L (ref 98–111)
Creatinine, Ser: 1.52 mg/dL — ABNORMAL HIGH (ref 0.44–1.00)
GFR calc Af Amer: 39 mL/min — ABNORMAL LOW (ref >60)
GFR calc non Af Amer: 33 mL/min — ABNORMAL LOW (ref >60)
Glucose, Bld: 127 mg/dL — ABNORMAL HIGH (ref 70–99)
Potassium: 4.8 mmol/L (ref 3.5–5.1)
Sodium: 136 mmol/L (ref 135–145)

## 2019-11-07 NOTE — Progress Notes (Signed)
Progress Note  Patient Name: [name] Date of Encounter: 11/07/2019  Primary Cardiologist: Ena Dawley, MD   Subjective   Dyspnea has improved.  She denies exertional chest pain.  Inpatient Medications    Scheduled Meds: . albuterol  3 mL Inhalation BID  . allopurinol  300 mg Oral Daily  . amLODipine  10 mg Oral Daily  . aspirin  81 mg Oral Daily  . atorvastatin  20 mg Oral QHS  . doxycycline  100 mg Oral Q12H  . DULoxetine  20 mg Oral Daily  . DULoxetine  60 mg Oral BID  . enoxaparin (LOVENOX) injection  30 mg Subcutaneous Q24H  . methylPREDNISolone (SOLU-MEDROL) injection  40 mg Intravenous Q12H  . metoprolol succinate  50 mg Oral Daily  . multivitamin with minerals  1 tablet Oral Daily  . nicotine  14 mg Transdermal Daily  . pantoprazole  40 mg Oral BID  . sodium chloride flush  3 mL Intravenous Q12H  . umeclidinium bromide  1 puff Inhalation Daily   Continuous Infusions: . sodium chloride     PRN Meds: sodium chloride, acetaminophen, albuterol, ondansetron (ZOFRAN) IV, oxyCODONE-acetaminophen, sodium chloride flush   Vital Signs    Vitals:   11/06/19 2236 11/07/19 0516 11/07/19 0808 11/07/19 0825  BP:  (!) 150/80 (!) 152/76   Pulse: 84 76 81   Resp: 18 18 18    Temp:  97.7 F (36.5 C)    TempSrc:  Oral    SpO2: 92% 98% 96% 96%  Weight:  48.1 kg    Height:        Intake/Output Summary (Last 24 hours) at 11/07/2019 1205 Last data filed at 11/07/2019 0910 Gross per 24 hour  Intake 946 ml  Output 600 ml  Net 346 ml   Last 3 Weights 11/07/2019 11/06/2019 11/05/2019  Weight (lbs) 106 lb 107 lb 4.8 oz 110 lb 6.4 oz  Weight (kg) 48.081 kg 48.671 kg 50.077 kg      Telemetry    Sinus- Personally Reviewed  Physical Exam   GEN: No acute distress.  Frail Neck: No JVD Cardiac: RRR Respiratory:  Diminished breath sounds throughout.  Mild expiratory wheeze. GI: Soft, nontender, non-distended  MS: No edema Neuro:  Nonfocal  Psych: Normal affect    Labs    High Sensitivity Troponin:   Recent Labs  Lab 11/04/19 0341 11/04/19 1551  TROPONINIHS 38* 78*      Chemistry Recent Labs  Lab 11/05/19 0458 11/06/19 0528 11/07/19 0259  NA 139 135 136  K 5.0 4.5 4.8  CL 106 101 102  CO2 25 23 22   GLUCOSE 104* 93 127*  BUN 30* 28* 27*  CREATININE 1.62* 1.56* 1.52*  CALCIUM 8.5* 8.2* 8.3*  GFRNONAA 31* 32* 33*  GFRAA 36* 38* 39*  ANIONGAP 8 11 12      Hematology Recent Labs  Lab 11/05/19 0458 11/06/19 0528 11/07/19 0259  WBC 10.6* 11.9* 8.0  RBC 3.07* 3.33* 3.47*  HGB 8.8* 9.5* 9.8*  HCT 28.1* 31.4* 31.8*  MCV 91.5 94.3 91.6  MCH 28.7 28.5 28.2  MCHC 31.3 30.3 30.8  RDW 17.5* 17.5* 17.1*  PLT 330 384 350    BNP Recent Labs  Lab 11/04/19 0341  BNP 1,120.6*     DDimer  Recent Labs  Lab 11/05/19 1705  DDIMER 1.66*     Radiology    DG Chest 2 View  Result Date: 11/06/2019 CLINICAL DATA:  Short of breath EXAM: CHEST - 2 VIEW  COMPARISON:  11/04/2019 FINDINGS: Small left greater than right pleural effusions, improved compared to prior. Airspace disease at the left greater than right lung base. Stable cardiomediastinal silhouette with aortic atherosclerosis. No pneumothorax. IMPRESSION: 1. Small left greater than right pleural effusions with interval improvement since 11/04/2019. 2. Airspace disease at the left greater than right lung base, likely atelectasis. 3. Decreased interstitial pulmonary edema. Electronically Signed   By: Donavan Foil M.D.   On: 11/06/2019 17:54   NM Pulmonary Perfusion  Result Date: 11/06/2019 CLINICAL DATA:  Shortness of breath EXAM: NUCLEAR MEDICINE PERFUSION LUNG SCAN TECHNIQUE: Perfusion images were obtained in multiple projections after intravenous injection of radiopharmaceutical. Ventilation scans intentionally deferred if perfusion scan and chest x-ray adequate for interpretation during COVID 19 epidemic. RADIOPHARMACEUTICALS:  4.06 mCi Tc-30m MAA IV COMPARISON:  Chest x-ray  11/06/2019 FINDINGS: Homogeneous perfusion to the lungs without focal perfusion defect. IMPRESSION: Negative.  No perfusion abnormality is seen. Electronically Signed   By: Donavan Foil M.D.   On: 11/06/2019 17:53   Patient Profile     74 y.o. female with past medical history of hypertension, COPD, peripheral vascular disease, obstructive sleep apnea being evaluated for acute on chronic diastolic congestive heart failure.  Echocardiogram this admission shows ejection fraction 60 to 65%.  Assessment & Plan    1 acute on chronic diastolic congestive heart failure-I/O-127; Wt 48.1 kg.  Patient does not appear to be volume overloaded on examination.  Predominant issue appears to be COPD.  We will not resume diuretics.  2 COPD-continue pulmonary toilet.  3 elevated troponin-no clear trend and no exertional chest pain.  Would not pursue further ischemia evaluation.  4 hypertension-continue present medications.  Can adjust as an outpatient.  5 peripheral vascular disease-continue aspirin and statin.  [facility] will sign off.   Medication Recommendations: Continue present medications at discharge. Other recommendations (labs, testing, etc): No further cardiac testing. Follow up as an outpatient: Follow-up Dr. Meda Coffee 8 to 12 weeks following discharge.  For questions or updates, please contact [place] Please consult [email or web address] for contact info under        Signed, Kirk Ruths, MD  11/07/2019, 12:05 PM

## 2019-11-07 NOTE — Plan of Care (Signed)
  Problem: Activity: Goal: Risk for activity intolerance will decrease Outcome: Progressing   Problem: Clinical Measurements: Goal: Respiratory complications will improve Outcome: Progressing

## 2019-11-07 NOTE — Progress Notes (Signed)
[facility] PROGRESS NOTE    Progress Note  [name]  HDQ:[id number] DOB: 1946/06/10 DOA: 11/04/2019 PCP: [name], MD     Brief Narrative:   [name] is an 74 y.o. female past medical history of obstructive sleep apnea, Parkinson's disease, essential hypertension, COPD grade 1 diastolic heart failure chronic kidney disease presents with shortness of breath.  She relate sudden onset of shortness of breath that woke her up from her sleep on the day of admission feeling like short of breath especially with ambulation.  Assessment/Plan:   Acute respiratory failure with hypoxia with associated flash pulmonary edema/acute diastolic heart failure and possibly acute COPD exacerbation Wean off oxygen, continue IV diuresis, she is only negative about 800 cc, will limit her fluid intake. Continue strict I's and O's and daily weights. Continue to monitor creatinine and potassium closely. Of Lasix  continue steroids, antibiotics and pulmonary toileting measures .  Chronic kidney disease stage IIIa:  baseline creatinine of 1.2-1.4  stable  Essential hypertension: Blood pressure seems to be fairly controlled continue metoprolol, will restart lisinopril tomorrow morning.  Hyperlipidemia Continue Lipitor.  Chronic pain: Continue current medication.  Ethics: She is DNR/DNI.  DVT prophylaxis: lovenox Family Communication:none Disposition Plan/Barrier to D/C: unable to determine Code Status:     Code Status Orders  (From admission, onward)         Start     Ordered   11/04/19 0943  Do not attempt resuscitation (DNR)  Continuous    Question Answer Comment  In the event of cardiac or respiratory ARREST Do not call a "code blue"   In the event of cardiac or respiratory ARREST Do not perform Intubation, CPR, defibrillation or ACLS   In the event of cardiac or respiratory ARREST Use medication by any route, position, wound care, and other measures to relive  pain and suffering. May use oxygen, suction and manual treatment of airway obstruction as needed for comfort.      11/04/19 0944        Code Status History    Date Active Date Inactive Code Status Order ID Comments User Context   08/02/2018 0001 08/04/2018 1634 Full Code [id number]  [name], MD ED   05/14/2018 2131 05/17/2018 2309 Full Code [id number]  [name], MD ED   10/10/2017 0130 10/16/2017 1644 Full Code [id number]  [name], MD ED   01/25/2016 2021 02/02/2016 1643 Full Code [id number]  [name], MD Inpatient   07/29/2015 0310 07/30/2015 1738 Full Code [id number]  [name], MD Inpatient   11/23/2011 1823 11/25/2011 1339 Full Code [id number]  [name], MD Inpatient   Advance Care Planning Activity        IV Access:    Peripheral IV   Procedures and diagnostic studies:   DG Chest 2 View  Result Date: 11/06/2019 CLINICAL DATA:  Short of breath EXAM: CHEST - 2 VIEW COMPARISON:  11/04/2019 FINDINGS: Small left greater than right pleural effusions, improved compared to prior. Airspace disease at the left greater than right lung base. Stable cardiomediastinal silhouette with aortic atherosclerosis. No pneumothorax. IMPRESSION: 1. Small left greater than right pleural effusions with interval improvement since 11/04/2019. 2. Airspace disease at the left greater than right lung base, likely atelectasis. 3. Decreased interstitial pulmonary edema. Electronically Signed   By: Donavan Foil M.D.   On: 11/06/2019 17:54   NM Pulmonary Perfusion  Result Date: 11/06/2019 CLINICAL DATA:  Shortness of breath EXAM: NUCLEAR MEDICINE  PERFUSION LUNG SCAN TECHNIQUE: Perfusion images were obtained in multiple projections after intravenous injection of radiopharmaceutical. Ventilation scans intentionally deferred if perfusion scan and chest x-ray adequate for interpretation during COVID 19 epidemic. RADIOPHARMACEUTICALS:  4.06 mCi Tc-88m MAA IV COMPARISON:  Chest x-ray 11/06/2019  FINDINGS: Homogeneous perfusion to the lungs without focal perfusion defect. IMPRESSION: Negative.  No perfusion abnormality is seen. Electronically Signed   By: Donavan Foil M.D.   On: 11/06/2019 17:53     Medical Consultants:    None.  Anti-Infectives:   None  Subjective:    [name]  was not on rounds today, feels better, no new complaint.  Objective:    Vitals:   11/07/19 0516 11/07/19 0808 11/07/19 0825 11/07/19 1252  BP: (!) 150/80 (!) 152/76  128/62  Pulse: 76 81  76  Resp: 18 18  18   Temp: 97.7 F (36.5 C)     TempSrc: Oral     SpO2: 98% 96% 96% 99%  Weight: 48.1 kg     Height:       SpO2: 99 % O2 Flow Rate (L/min): 3 L/min FiO2 (%): (S) 60 %   Intake/Output Summary (Last 24 hours) at 11/07/2019 1427 Last data filed at 11/07/2019 1400 Gross per 24 hour  Intake 1186 ml  Output 600 ml  Net 586 ml   Filed Weights   11/05/19 0459 11/06/19 0425 11/07/19 0516  Weight: 50.1 kg 48.7 kg 48.1 kg    Exam: General exam: In no acute distress. Respiratory system: Good air movement and wheezing bilaterally with crackles at bases. Cardiovascular system: S1 & S2 heard, RRR. No JVD. Gastrointestinal system: Abdomen is nondistended, soft and nontender.  Central nervous system: Alert and oriented. No focal neurological deficits. Extremities: No pedal edema. Skin: No rashes, lesions or ulcers     Data Reviewed:    Labs: Basic Metabolic Panel: Recent Labs  Lab 11/04/19 0341 11/04/19 0352 11/05/19 0458 11/06/19 0528 11/07/19 0259  NA 141 141 139 135 136  K 4.1 4.1 5.0 4.5 4.8  CL 110 110 106 101 102  CO2 21*  --  25 23 22   GLUCOSE 263* 137* 104* 93 127*  BUN 23 23 30* 28* 27*  CREATININE 1.43* 1.30* 1.62* 1.56* 1.52*  CALCIUM 7.9*  --  8.5* 8.2* 8.3*   GFR Estimated Creatinine Clearance: 24.7 mL/min (A) (by C-G formula based on SCr of 1.52 mg/dL (H)). Liver Function Tests: No results for input(s): AST, ALT, ALKPHOS, BILITOT, PROT, ALBUMIN in  the last 168 hours. No results for input(s): LIPASE, AMYLASE in the last 168 hours. No results for input(s): AMMONIA in the last 168 hours. Coagulation profile No results for input(s): INR, PROTIME in the last 168 hours. COVID-19 Labs  Recent Labs    11/05/19 1705  DDIMER 1.66*    Lab Results  Component Value Date   SARSCOV2NAA NEGATIVE 11/04/2019    CBC: Recent Labs  Lab 11/04/19 0341 11/04/19 0352 11/05/19 0458 11/06/19 0528 11/07/19 0259  WBC 21.5*  --  10.6* 11.9* 8.0  NEUTROABS 19.0*  --  9.0* 10.3* 7.6  HGB 9.6* 10.5* 8.8* 9.5* 9.8*  HCT 31.9* 31.0* 28.1* 31.4* 31.8*  MCV 94.1  --  91.5 94.3 91.6  PLT 305  --  330 384 350   Cardiac Enzymes: No results for input(s): CKTOTAL, CKMB, CKMBINDEX, TROPONINI in the last 168 hours. BNP (last 3 results) No results for input(s): PROBNP in the last 8760 hours. CBG: Recent Labs  Lab  11/04/19 0342  GLUCAP 132*   D-Dimer: Recent Labs    11/05/19 1705  DDIMER 1.66*   Hgb A1c: No results for input(s): HGBA1C in the last 72 hours. Lipid Profile: Recent Labs    11/05/19 0458  CHOL 145  HDL 64  LDLCALC 63  TRIG 90  CHOLHDL 2.3   Thyroid function studies: No results for input(s): TSH, T4TOTAL, T3FREE, THYROIDAB in the last 72 hours.  Invalid input(s): FREET3 Anemia work up: No results for input(s): VITAMINB12, FOLATE, FERRITIN, TIBC, IRON, RETICCTPCT in the last 72 hours. Sepsis Labs: Recent Labs  Lab 11/04/19 0341 11/05/19 0458 11/06/19 0528 11/07/19 0259  WBC 21.5* 10.6* 11.9* 8.0   Microbiology Recent Results (from the past 240 hour(s))  SARS CORONAVIRUS 2 (TAT 6-24 HRS) Nasopharyngeal Nasopharyngeal Swab     Status: None   Collection Time: 11/04/19  2:31 AM   Specimen: Nasopharyngeal Swab  Result Value Ref Range Status   SARS Coronavirus 2 NEGATIVE NEGATIVE Final    Comment: (NOTE) SARS-CoV-2 target nucleic acids are NOT DETECTED. The SARS-CoV-2 RNA is generally detectable in upper and  lower respiratory specimens during the acute phase of infection. Negative results do not preclude SARS-CoV-2 infection, do not rule out co-infections with other pathogens, and should not be used as the sole basis for treatment or other patient management decisions. Negative results must be combined with clinical observations, patient history, and epidemiological information. The expected result is Negative. Fact Sheet for Patients: SugarRoll.be Fact Sheet for Healthcare Providers: https://www.woods-mathews.com/ This test is not yet approved or cleared by the [place] FDA and  has been authorized for detection and/or diagnosis of SARS-CoV-2 by FDA under an Emergency Use Authorization (EUA). This EUA will remain  in effect (meaning this test can be used) for the duration of the COVID-19 declaration under Section 56 4(b)(1) of the Act, 21 U.S.C. section 360bbb-3(b)(1), unless the authorization is terminated or revoked sooner. Performed at [facility], [place] [address] [place], [place] [address]   Blood culture (routine x 2)     Status: None (Preliminary result)   Collection Time: 11/04/19  6:10 AM   Specimen: BLOOD RIGHT WRIST  Result Value Ref Range Status   Specimen Description BLOOD RIGHT WRIST  Final   Special Requests   Final    BOTTLES DRAWN AEROBIC AND ANAEROBIC Blood Culture results may not be optimal due to an inadequate volume of blood received in culture bottles   Culture   Final    NO GROWTH 3 DAYS Performed at [facility], [place] [address] [place], [place] [address]    Report Status PENDING  Incomplete  Blood culture (routine x 2)     Status: None (Preliminary result)   Collection Time: 11/04/19  6:13 AM   Specimen: BLOOD RIGHT HAND  Result Value Ref Range Status   Specimen Description BLOOD RIGHT HAND  Final   Special Requests   Final    BOTTLES DRAWN AEROBIC AND ANAEROBIC Blood Culture results may not be  optimal due to an inadequate volume of blood received in culture bottles   Culture   Final    NO GROWTH 3 DAYS Performed at [facility], [place] [address] [place], [place] [address]    Report Status PENDING  Incomplete  Urine culture     Status: None   Collection Time: 11/04/19  9:11 AM   Specimen: Urine, Random  Result Value Ref Range Status   Specimen Description URINE, RANDOM  Final   Special Requests NONE  Final   Culture   Final    NO GROWTH Performed at [facility], [place] [address] [place], [place] [address]    Report Status 11/05/2019 FINAL  Final  MRSA PCR Screening     Status: None   Collection Time: 11/05/19  8:58 PM   Specimen: Nasal Mucosa; Nasopharyngeal  Result Value Ref Range Status   MRSA by PCR NEGATIVE NEGATIVE Final    Comment:        The GeneXpert MRSA Assay (FDA approved for NASAL specimens only), is one component of a comprehensive MRSA colonization surveillance program. It is not intended to diagnose MRSA infection nor to guide or monitor treatment for MRSA infections. Performed at [facility], [place] [address], [place], [place] [address]      Medications:   . albuterol  3 mL Inhalation BID  . allopurinol  300 mg Oral Daily  . amLODipine  10 mg Oral Daily  . aspirin  81 mg Oral Daily  . atorvastatin  20 mg Oral QHS  . doxycycline  100 mg Oral Q12H  . DULoxetine  20 mg Oral Daily  . DULoxetine  60 mg Oral BID  . enoxaparin (LOVENOX) injection  30 mg Subcutaneous Q24H  . methylPREDNISolone (SOLU-MEDROL) injection  40 mg Intravenous Q12H  . metoprolol succinate  50 mg Oral Daily  . multivitamin with minerals  1 tablet Oral Daily  . nicotine  14 mg Transdermal Daily  . pantoprazole  40 mg Oral BID  . sodium chloride flush  3 mL Intravenous Q12H  . umeclidinium bromide  1 puff Inhalation Daily   Continuous Infusions: . sodium chloride        LOS: 3 days   Iona Beard Osei-Bonsu  [facility]  11/07/2019, 2:27 PM

## 2019-11-08 LAB — CBC WITH DIFFERENTIAL/PLATELET
Abs Immature Granulocytes: 0.04 10*3/uL (ref 0.00–0.07)
Basophils Absolute: 0 10*3/uL (ref 0.0–0.1)
Basophils Relative: 0 %
Eosinophils Absolute: 0 10*3/uL (ref 0.0–0.5)
Eosinophils Relative: 0 %
HCT: 29.8 % — ABNORMAL LOW (ref 36.0–46.0)
Hemoglobin: 9.4 g/dL — ABNORMAL LOW (ref 12.0–15.0)
Immature Granulocytes: 1 %
Lymphocytes Relative: 4 %
Lymphs Abs: 0.4 10*3/uL — ABNORMAL LOW (ref 0.7–4.0)
MCH: 28.7 pg (ref 26.0–34.0)
MCHC: 31.5 g/dL (ref 30.0–36.0)
MCV: 91.1 fL (ref 80.0–100.0)
Monocytes Absolute: 0.2 10*3/uL (ref 0.1–1.0)
Monocytes Relative: 2 %
Neutro Abs: 7.8 10*3/uL — ABNORMAL HIGH (ref 1.7–7.7)
Neutrophils Relative %: 93 %
Platelets: 390 10*3/uL (ref 150–400)
RBC: 3.27 MIL/uL — ABNORMAL LOW (ref 3.87–5.11)
RDW: 16.5 % — ABNORMAL HIGH (ref 11.5–15.5)
WBC: 8.3 10*3/uL (ref 4.0–10.5)
nRBC: 0 % (ref 0.0–0.2)

## 2019-11-08 NOTE — Progress Notes (Signed)
[facility] PROGRESS NOTE    Progress Note  [name]  OAC:[id number] DOB: [birth year] DOA: 11/04/2019 PCP: [name], MD     Brief Narrative:   [name] is an 74 y.o. female past medical history of obstructive sleep apnea, Parkinson's disease, essential hypertension, COPD grade 1 diastolic heart failure chronic kidney disease presents with shortness of breath.  She relate sudden onset of shortness of breath that woke her up from her sleep on the day of admission feeling like short of breath especially with ambulation.  Assessment/Plan:   Acute respiratory failure with hypoxia with associated flash pulmonary edema/acute diastolic heart failure and possibly acute COPD exacerbation Wean off oxygen  as tolerated  diuresis discontinued per Cardiology  continue  Antibiotic,pulmonary toileting measures with IV steroids, supportive care  monitor electrolytes renal function .  Chronic kidney disease stage IIIa:  baseline creatinine of 1.2-1.4  stable  Essential hypertension: Blood pressure seems to be fairly controlled continue metoprolol, will restart lisinopril tomorrow morning.  Hyperlipidemia Continue Lipitor.  Chronic pain: Continue current medication.  Ethics: She is DNR/DNI.  DVT prophylaxis: lovenox Family Communication:none Disposition Plan/Barrier to D/C: unable to determine Code Status:     Code Status Orders  (From admission, onward)         Start     Ordered   11/04/19 0943  Do not attempt resuscitation (DNR)  Continuous    Question Answer Comment  In the event of cardiac or respiratory ARREST Do not call a "code blue"   In the event of cardiac or respiratory ARREST Do not perform Intubation, CPR, defibrillation or ACLS   In the event of cardiac or respiratory ARREST Use medication by any route, position, wound care, and other measures to relive pain and suffering. May use oxygen, suction and manual treatment of airway obstruction  as needed for comfort.      11/04/19 0944        Code Status History    Date Active Date Inactive Code Status Order ID Comments User Context   08/02/2018 0001 08/04/2018 1634 Full Code [id number]  [name], MD ED   05/14/2018 2131 05/17/2018 2309 Full Code [id number]  [name], MD ED   10/10/2017 0130 10/16/2017 1644 Full Code [id number]  [name], MD ED   01/25/2016 2021 02/02/2016 1643 Full Code [id number]  [name], MD Inpatient   07/29/2015 0310 07/30/2015 1738 Full Code [id number]  [name], MD Inpatient   11/23/2011 1823 11/25/2011 1339 Full Code [id number]  [name], MD Inpatient   Advance Care Planning Activity        IV Access:    Peripheral IV   Procedures and diagnostic studies:   DG Chest 2 View  Result Date: 11/06/2019 CLINICAL DATA:  Short of breath EXAM: CHEST - 2 VIEW COMPARISON:  11/04/2019 FINDINGS: Small left greater than right pleural effusions, improved compared to prior. Airspace disease at the left greater than right lung base. Stable cardiomediastinal silhouette with aortic atherosclerosis. No pneumothorax. IMPRESSION: 1. Small left greater than right pleural effusions with interval improvement since 11/04/2019. 2. Airspace disease at the left greater than right lung base, likely atelectasis. 3. Decreased interstitial pulmonary edema. Electronically Signed   By: Donavan Foil M.D.   On: 11/06/2019 17:54   NM Pulmonary Perfusion  Result Date: 11/06/2019 CLINICAL DATA:  Shortness of breath EXAM: NUCLEAR MEDICINE PERFUSION LUNG SCAN TECHNIQUE: Perfusion images were obtained in multiple projections after intravenous injection of radiopharmaceutical.  Ventilation scans intentionally deferred if perfusion scan and chest x-ray adequate for interpretation during COVID 19 epidemic. RADIOPHARMACEUTICALS:  4.06 mCi Tc-76m MAA IV COMPARISON:  Chest x-ray 11/06/2019 FINDINGS: Homogeneous perfusion to the lungs without focal perfusion defect. IMPRESSION:  Negative.  No perfusion abnormality is seen. Electronically Signed   By: Donavan Foil M.D.   On: 11/06/2019 17:53     Medical Consultants:    None.  Anti-Infectives:   None  Subjective:    [name]   Denies any fever or chills.  No chest pain  Objective:    Vitals:   11/07/19 2117 11/08/19 0539 11/08/19 0812 11/08/19 0906  BP:  (!) 144/74  (!) 132/59  Pulse: 74 69  79  Resp: 18 20    Temp:  97.8 F (36.6 C)    TempSrc:      SpO2: 96% 100% 96%   Weight:  48.1 kg    Height:       SpO2: 96 % O2 Flow Rate (L/min): 2 L/min FiO2 (%): (S) 60 %   Intake/Output Summary (Last 24 hours) at 11/08/2019 1113 Last data filed at 11/08/2019 0949 Gross per 24 hour  Intake 853 ml  Output 675 ml  Net 178 ml   Filed Weights   11/06/19 0425 11/07/19 0516 11/08/19 0539  Weight: 48.7 kg 48.1 kg 48.1 kg    Exam: General exam: In no acute distress. Respiratory system:  Diminished breath sounds anteriorly with occasional wheezing Cardiovascular system: S1 & S2 heard, RRR. No JVD. Gastrointestinal system: Abdomen is nondistended, soft and nontender.  Central nervous system: Alert and oriented. No focal neurological deficits. Extremities: No pedal edema. Skin: No rashes, lesions or ulcers     Data Reviewed:    Labs: Basic Metabolic Panel: Recent Labs  Lab 11/04/19 0341 11/04/19 0352 11/05/19 0458 11/06/19 0528 11/07/19 0259  NA 141 141 139 135 136  K 4.1 4.1 5.0 4.5 4.8  CL 110 110 106 101 102  CO2 21*  --  25 23 22   GLUCOSE 145* 137* 104* 93 127*  BUN 23 23 30* 28* 27*  CREATININE 1.43* 1.30* 1.62* 1.56* 1.52*  CALCIUM 7.9*  --  8.5* 8.2* 8.3*   GFR Estimated Creatinine Clearance: 24.7 mL/min (A) (by C-G formula based on SCr of 1.52 mg/dL (H)). Liver Function Tests: No results for input(s): AST, ALT, ALKPHOS, BILITOT, PROT, ALBUMIN in the last 168 hours. No results for input(s): LIPASE, AMYLASE in the last 168 hours. No results for input(s): AMMONIA  in the last 168 hours. Coagulation profile No results for input(s): INR, PROTIME in the last 168 hours. COVID-19 Labs  Recent Labs    11/05/19 1705  DDIMER 1.66*    Lab Results  Component Value Date   SARSCOV2NAA NEGATIVE 11/04/2019    CBC: Recent Labs  Lab 11/04/19 0341 11/04/19 0352 11/05/19 0458 11/06/19 0528 11/07/19 0259 11/08/19 0209  WBC 21.5*  --  10.6* 11.9* 8.0 8.3  NEUTROABS 19.0*  --  9.0* 10.3* 7.6 7.8*  HGB 9.6* 10.5* 8.8* 9.5* 9.8* 9.4*  HCT 31.9* 31.0* 28.1* 31.4* 31.8* 29.8*  MCV 94.1  --  91.5 94.3 91.6 91.1  PLT 305  --  330 384 350 390   Cardiac Enzymes: No results for input(s): CKTOTAL, CKMB, CKMBINDEX, TROPONINI in the last 168 hours. BNP (last 3 results) No results for input(s): PROBNP in the last 8760 hours. CBG: Recent Labs  Lab 11/04/19 0342  GLUCAP 132*   D-Dimer: Recent Labs  11/05/19 1705  DDIMER 1.66*   Hgb A1c: No results for input(s): HGBA1C in the last 72 hours. Lipid Profile: No results for input(s): CHOL, HDL, LDLCALC, TRIG, CHOLHDL, LDLDIRECT in the last 72 hours. Thyroid function studies: No results for input(s): TSH, T4TOTAL, T3FREE, THYROIDAB in the last 72 hours.  Invalid input(s): FREET3 Anemia work up: No results for input(s): VITAMINB12, FOLATE, FERRITIN, TIBC, IRON, RETICCTPCT in the last 72 hours. Sepsis Labs: Recent Labs  Lab 11/05/19 0458 11/06/19 0528 11/07/19 0259 11/08/19 0209  WBC 10.6* 11.9* 8.0 8.3   Microbiology Recent Results (from the past 240 hour(s))  SARS CORONAVIRUS 2 (TAT 6-24 HRS) Nasopharyngeal Nasopharyngeal Swab     Status: None   Collection Time: 11/04/19  2:31 AM   Specimen: Nasopharyngeal Swab  Result Value Ref Range Status   SARS Coronavirus 2 NEGATIVE NEGATIVE Final    Comment: (NOTE) SARS-CoV-2 target nucleic acids are NOT DETECTED. The SARS-CoV-2 RNA is generally detectable in upper and lower respiratory specimens during the acute phase of infection. Negative results  do not preclude SARS-CoV-2 infection, do not rule out co-infections with other pathogens, and should not be used as the sole basis for treatment or other patient management decisions. Negative results must be combined with clinical observations, patient history, and epidemiological information. The expected result is Negative. Fact Sheet for Patients: SugarRoll.be Fact Sheet for Healthcare Providers: https://www.woods-mathews.com/ This test is not yet approved or cleared by the [place] FDA and  has been authorized for detection and/or diagnosis of SARS-CoV-2 by FDA under an Emergency Use Authorization (EUA). This EUA will remain  in effect (meaning this test can be used) for the duration of the COVID-19 declaration under Section 56 4(b)(1) of the Act, 21 U.S.C. section 360bbb-3(b)(1), unless the authorization is terminated or revoked sooner. Performed at [facility], [place] [address], [place], [place] [address]   Blood culture (routine x 2)     Status: None (Preliminary result)   Collection Time: 11/04/19  6:10 AM   Specimen: BLOOD RIGHT WRIST  Result Value Ref Range Status   Specimen Description BLOOD RIGHT WRIST  Final   Special Requests   Final    BOTTLES DRAWN AEROBIC AND ANAEROBIC Blood Culture results may not be optimal due to an inadequate volume of blood received in culture bottles   Culture   Final    NO GROWTH 3 DAYS Performed at [facility], [place] [address] [place],  [address]    Report Status PENDING  Incomplete  Blood culture (routine x 2)     Status: None (Preliminary result)   Collection Time: 11/04/19  6:13 AM   Specimen: BLOOD RIGHT HAND  Result Value Ref Range Status   Specimen Description BLOOD RIGHT HAND  Final   Special Requests   Final    BOTTLES DRAWN AEROBIC AND ANAEROBIC Blood Culture results may not be optimal due to an inadequate volume of blood received in culture bottles   Culture    Final    NO GROWTH 3 DAYS Performed at [facility], [place] [address] [place],  [address]    Report Status PENDING  Incomplete  Urine culture     Status: None   Collection Time: 11/04/19  9:11 AM   Specimen: Urine, Random  Result Value Ref Range Status   Specimen Description URINE, RANDOM  Final   Special Requests NONE  Final   Culture   Final    NO GROWTH Performed at  [facility], [place] [address], [place], [place] [address]    Report Status 11/05/2019 FINAL  Final  MRSA PCR Screening     Status: None   Collection Time: 11/05/19  8:58 PM   Specimen: Nasal Mucosa; Nasopharyngeal  Result Value Ref Range Status   MRSA by PCR NEGATIVE NEGATIVE Final    Comment:        The GeneXpert MRSA Assay (FDA approved for NASAL specimens only), is one component of a comprehensive MRSA colonization surveillance program. It is not intended to diagnose MRSA infection nor to guide or monitor treatment for MRSA infections. Performed at [facility], [place] [address] [place], [place] [address]      Medications:   . albuterol  3 mL Inhalation BID  . allopurinol  300 mg Oral Daily  . amLODipine  10 mg Oral Daily  . aspirin  81 mg Oral Daily  . atorvastatin  20 mg Oral QHS  . doxycycline  100 mg Oral Q12H  . DULoxetine  20 mg Oral Daily  . DULoxetine  60 mg Oral BID  . enoxaparin (LOVENOX) injection  30 mg Subcutaneous Q24H  . methylPREDNISolone (SOLU-MEDROL) injection  40 mg Intravenous Q12H  . metoprolol succinate  50 mg Oral Daily  . multivitamin with minerals  1 tablet Oral Daily  . nicotine  14 mg Transdermal Daily  . pantoprazole  40 mg Oral BID  . sodium chloride flush  3 mL Intravenous Q12H  . umeclidinium bromide  1 puff Inhalation Daily   Continuous Infusions: . sodium chloride        LOS: 4 days   Iona Beard Osei-Bonsu  [facility]  11/08/2019, 11:13 AM

## 2019-11-08 NOTE — Plan of Care (Signed)
  Problem: Education: Goal: Knowledge of General Education information will improve Description: Including pain rating scale, medication(s)/side effects and non-pharmacologic comfort measures Outcome: Adequate for Discharge   Problem: Clinical Measurements: Goal: Ability to maintain clinical measurements within normal limits will improve Outcome: Adequate for Discharge

## 2019-11-09 LAB — BASIC METABOLIC PANEL
Anion gap: 10 (ref 5–15)
BUN: 41 mg/dL — ABNORMAL HIGH (ref 8–23)
CO2: 22 mmol/L (ref 22–32)
Calcium: 8.4 mg/dL — ABNORMAL LOW (ref 8.9–10.3)
Chloride: 106 mmol/L (ref 98–111)
Creatinine, Ser: 1.61 mg/dL — ABNORMAL HIGH (ref 0.44–1.00)
GFR calc Af Amer: 36 mL/min — ABNORMAL LOW (ref >60)
GFR calc non Af Amer: 31 mL/min — ABNORMAL LOW (ref >60)
Glucose, Bld: 130 mg/dL — ABNORMAL HIGH (ref 70–99)
Potassium: 5.1 mmol/L (ref 3.5–5.1)
Sodium: 138 mmol/L (ref 135–145)

## 2019-11-09 LAB — CULTURE, BLOOD (ROUTINE X 2)
Culture: NO GROWTH
Culture: NO GROWTH

## 2019-11-09 LAB — CBC WITH DIFFERENTIAL/PLATELET
Abs Immature Granulocytes: 0.04 10*3/uL (ref 0.00–0.07)
Basophils Absolute: 0 10*3/uL (ref 0.0–0.1)
Basophils Relative: 0 %
Eosinophils Absolute: 0 10*3/uL (ref 0.0–0.5)
Eosinophils Relative: 0 %
HCT: 33.8 % — ABNORMAL LOW (ref 36.0–46.0)
Hemoglobin: 10.3 g/dL — ABNORMAL LOW (ref 12.0–15.0)
Immature Granulocytes: 1 %
Lymphocytes Relative: 6 %
Lymphs Abs: 0.5 10*3/uL — ABNORMAL LOW (ref 0.7–4.0)
MCH: 28.2 pg (ref 26.0–34.0)
MCHC: 30.5 g/dL (ref 30.0–36.0)
MCV: 92.6 fL (ref 80.0–100.0)
Monocytes Absolute: 0.1 10*3/uL (ref 0.1–1.0)
Monocytes Relative: 1 %
Neutro Abs: 7.7 10*3/uL (ref 1.7–7.7)
Neutrophils Relative %: 92 %
Platelets: 452 10*3/uL — ABNORMAL HIGH (ref 150–400)
RBC: 3.65 MIL/uL — ABNORMAL LOW (ref 3.87–5.11)
RDW: 16.6 % — ABNORMAL HIGH (ref 11.5–15.5)
WBC: 8.3 10*3/uL (ref 4.0–10.5)
nRBC: 0 % (ref 0.0–0.2)

## 2019-11-09 MED ORDER — DOXYCYCLINE HYCLATE 100 MG PO TABS: 100.0000 mg | ORAL_TABLET | Freq: Two times a day (BID) | ORAL | 0 refills | Status: DC

## 2019-11-09 MED ORDER — ALLOPURINOL 300 MG PO TABS: 300.0000 mg | ORAL_TABLET | Freq: Every day | ORAL | 0 refills | Status: DC

## 2019-11-09 MED ORDER — ALBUTEROL SULFATE (2.5 MG/3ML) 0.083% IN NEBU: 3.0000 mL | INHALATION_SOLUTION | Freq: Two times a day (BID) | RESPIRATORY_TRACT | 12 refills | Status: DC

## 2019-11-09 MED ORDER — ALBUTEROL SULFATE HFA 108 (90 BASE) MCG/ACT IN AERS: 2.0000 | INHALATION_SPRAY | Freq: Four times a day (QID) | RESPIRATORY_TRACT | 0 refills | Status: DC | PRN

## 2019-11-09 MED ORDER — ATORVASTATIN CALCIUM 20 MG PO TABS: 20.0000 mg | ORAL_TABLET | Freq: Every day | ORAL | 2 refills | Status: AC

## 2019-11-09 MED ORDER — METOPROLOL SUCCINATE ER 50 MG PO TB24: 50.0000 mg | ORAL_TABLET | Freq: Every day | ORAL | 0 refills | Status: DC

## 2019-11-09 MED ORDER — PREDNISONE 10 MG PO TABS: 20.0000 mg | ORAL_TABLET | Freq: Every day | ORAL | 0 refills | Status: AC

## 2019-11-09 MED ORDER — NICOTINE 14 MG/24HR TD PT24: 14.0000 mg | MEDICATED_PATCH | Freq: Every day | TRANSDERMAL | 0 refills | Status: DC

## 2019-11-09 MED ORDER — SODIUM ZIRCONIUM CYCLOSILICATE 10 G PO PACK
10.0000 g | PACK | Freq: Once | ORAL | Status: AC
  Administered 2019-11-09: 10 g via ORAL
  Filled 2019-11-09: qty 1

## 2019-11-09 MED ORDER — UMECLIDINIUM BROMIDE 62.5 MCG/INH IN AEPB: 1.0000 | INHALATION_SPRAY | Freq: Every day | RESPIRATORY_TRACT | 0 refills | Status: DC

## 2019-11-09 MED ORDER — AMLODIPINE BESYLATE 10 MG PO TABS: 10.0000 mg | ORAL_TABLET | Freq: Every day | ORAL | Status: AC

## 2019-11-09 MED FILL — predniSONE 10 MG TABS: 10 | 5 days supply | Qty: 10 | Fill #0

## 2019-11-09 MED FILL — ATORVASTATIN CALCIUM 20 MG: 20 | 30 days supply | Qty: 30 | Fill #0

## 2019-11-09 MED FILL — ALBUTEROL SUL 2.5 MG/3 ML S: (2.5 MG/3ML | 15 days supply | Qty: 75 | Fill #0

## 2019-11-09 MED FILL — ALLOPURINOL 100 MG TABLET: 100 | 30 days supply | Qty: 90 | Fill #0

## 2019-11-09 MED FILL — DOXYCYCLINE HYCLATE 100 MG: 100 | 1 days supply | Qty: 2 | Fill #0

## 2019-11-09 MED FILL — METOPROLOL SUCCINATE ER 50: 50 | 30 days supply | Qty: 30 | Fill #0

## 2019-11-09 MED FILL — INCRUSE ELLIPTA 62.5 MCG IN: 62.5 | 30 days supply | Qty: 30 | Fill #0

## 2019-11-09 MED FILL — VENTOLIN HFA 90 MCG INHALER: 108 (90 BAS | 25 days supply | Qty: 18 | Fill #0

## 2019-11-09 NOTE — Discharge Summary (Signed)
Physician Discharge Summary  [name] XMI:[id number] DOB: 11/05/1945 DOA: 11/04/2019  PCP: [name], MD  Admit date: 11/04/2019 Discharge date: 11/09/2019  Admitted From: Home  Disposition: Home   Recommendations for Outpatient Follow-up:  1. Follow up with PCP in 1-2 weeks 2. Please obtain BMP/CBC in one week   Home Health: yes  Discharge Condition: Stable.  CODE STATUS; DNR Diet recommendation: Heart Healthy   Brief/Interim Summary: [name] is an 74 y.o. female past medical history of obstructive sleep apnea, Parkinson's disease, essential hypertension, COPD grade 1 diastolic heart failure chronic kidney disease presents with shortness of breath.  She relate sudden onset of shortness of breath that woke her up from her sleep on the day of admission feeling like short of breath especially with ambulation.  Patient was admitted with acute hypoxic respiratory failure thought initially to be secondary to pulmonary edema.  Patient received IV Lasix.  Evaluated by cardiology who recommended to discharge patient off of Lasix.  Episode may be related to COPD exacerbation.  Will be discharged on prednisone and albuterol treatment.  1-acute on chronic diastolic heart failure: Received Lasix during this admission.  Evaluated by cardiology 2013 that patient was volume overload.  They recommended discharge patient off of Lexis  2-acute on chronic hypoxic respiratory failure secondary to COPD exacerbation: Patient received IV Solu-Medrol, nebulizer treatment. She improved.  She will be discharged on prednisone taper dose and inhaler.  3-acute on chronic kidney disease a stage IIIa: Creatinine baseline 1.2--1.4 Discharged off of Lasix.  Hold lisinopril. Creatinine has remained stable over the last several days with 1.5--1.6.  4-essential hypertension: Continue with metoprolol Hold lisinopril due to AKI     Discharge Diagnoses:  Principal Problem:   Acute respiratory  failure with hypoxia (HCC) Active Problems:   Tobacco use disorder   COPD (chronic obstructive pulmonary disease) (HCC)   Diabetes mellitus with neurological manifestation (HCC)   HTN (hypertension)   Acute renal failure with acute renal cortical necrosis superimposed on stage 3a chronic kidney disease (HCC)   Acute exacerbation of CHF (congestive heart failure) ([facility])   DNR (do not resuscitate)   Chronic pain syndrome   Acute pulmonary edema ([facility])   Positive D dimer    Discharge Instructions  Discharge Instructions    Diet - low sodium heart healthy   Complete by: As directed    Increase activity slowly   Complete by: As directed      Allergies as of 11/09/2019   No Known Allergies     Medication List    STOP taking these medications   ASA-APAP-Caff Buffered 227-194-33 MG Tabs   celecoxib 200 MG capsule Commonly known as: CELEBREX   potassium chloride SA 20 MEQ tablet Commonly known as: KLOR-CON     TAKE these medications   albuterol (2.5 MG/3ML) 0.083% nebulizer solution Commonly known as: PROVENTIL Inhale 3 mLs into the lungs 2 (two) times daily.   albuterol 108 (90 Base) MCG/ACT inhaler Commonly known as: VENTOLIN HFA Inhale 2 puffs into the lungs every 6 (six) hours as needed for wheezing or shortness of breath.   allopurinol 300 MG tablet Commonly known as: ZYLOPRIM Take 1 tablet (300 mg total) by mouth daily.   amLODipine 10 MG tablet Commonly known as: NORVASC Take 1 tablet (10 mg total) by mouth daily.   aspirin 81 MG tablet Take 1 tablet (81 mg total) by mouth daily.   atorvastatin 20 MG tablet Commonly known as: LIPITOR Take 1 tablet (  20 mg total) by mouth at bedtime.   diphenhydrAMINE 25 mg capsule Commonly known as: BENADRYL Take 25 mg by mouth every 6 (six) hours as needed for allergies.   doxycycline 100 MG tablet Commonly known as: VIBRA-TABS Take 1 tablet (100 mg total) by mouth every 12 (twelve) hours.   DULoxetine 60 MG  capsule Commonly known as: CYMBALTA Take 60 mg by mouth 2 (two) times daily.   DULoxetine 20 MG capsule Commonly known as: CYMBALTA Take 20 mg by mouth daily.   levorphanol 2 MG tablet Commonly known as: LEVODROMORAN Take 2 mg by mouth 2 (two) times daily.   metoprolol succinate 50 MG 24 hr tablet Commonly known as: TOPROL-XL Take 1 tablet (50 mg total) by mouth daily. Take with or immediately following a meal. Start taking on: November 10, 2019 What changed:   medication strength  how much to take  additional instructions   multivitamin capsule Take 1 capsule by mouth daily.   nicotine 14 mg/24hr patch Commonly known as: NICODERM CQ - dosed in mg/24 hours Place 1 patch (14 mg total) onto the skin daily.   oxyCODONE-acetaminophen 7.5-325 MG tablet Commonly known as: PERCOCET Take 1 tablet by mouth 4 (four) times daily as needed for severe pain.   pantoprazole 40 MG tablet Commonly known as: PROTONIX Take 40 mg by mouth 2 (two) times daily.   umeclidinium bromide 62.5 MCG/INH Aepb Commonly known as: INCRUSE ELLIPTA Inhale 1 puff into the lungs daily. Start taking on: November 10, 2019       No Known Allergies  Consultations:  Cardiology   Procedures/Studies: DG Chest 2 View  Result Date: 11/06/2019 CLINICAL DATA:  Short of breath EXAM: CHEST - 2 VIEW COMPARISON:  11/04/2019 FINDINGS: Small left greater than right pleural effusions, improved compared to prior. Airspace disease at the left greater than right lung base. Stable cardiomediastinal silhouette with aortic atherosclerosis. No pneumothorax. IMPRESSION: 1. Small left greater than right pleural effusions with interval improvement since 11/04/2019. 2. Airspace disease at the left greater than right lung base, likely atelectasis. 3. Decreased interstitial pulmonary edema. Electronically Signed   By: Donavan Foil M.D.   On: 11/06/2019 17:54   NM Pulmonary Perfusion  Result Date: 11/06/2019 CLINICAL DATA:   Shortness of breath EXAM: NUCLEAR MEDICINE PERFUSION LUNG SCAN TECHNIQUE: Perfusion images were obtained in multiple projections after intravenous injection of radiopharmaceutical. Ventilation scans intentionally deferred if perfusion scan and chest x-ray adequate for interpretation during COVID 19 epidemic. RADIOPHARMACEUTICALS:  4.06 mCi Tc-8m MAA IV COMPARISON:  Chest x-ray 11/06/2019 FINDINGS: Homogeneous perfusion to the lungs without focal perfusion defect. IMPRESSION: Negative.  No perfusion abnormality is seen. Electronically Signed   By: Donavan Foil M.D.   On: 11/06/2019 17:53   DG Chest Portable 1 View  Result Date: 11/04/2019 CLINICAL DATA:  Shortness of breath EXAM: PORTABLE CHEST 1 VIEW COMPARISON:  08/01/2018 FINDINGS: Unchanged mild cardiomegaly. Left-greater-than-right bibasilar opacities. Bilateral interstitial opacities, worse than on the prior study. Small left pleural effusion. IMPRESSION: Cardiomegaly, small left pleural effusion and interstitial opacities likely indicating cardiogenic pulmonary edema. Electronically Signed   By: Ulyses Jarred M.D.   On: 11/04/2019 03:19   ECHOCARDIOGRAM COMPLETE  Result Date: 11/04/2019   ECHOCARDIOGRAM REPORT   Patient Name:   BRETTANY SYDNEY Date of Exam: 11/04/2019 Medical Rec #:  [id number]       Height:       64.0 in Accession #:    [id number]      Weight:  103.0 lb Date of Birth:  1946-06-26        BSA:          1.48 m Patient Age:    21 years        BP:           154/81 mmHg Patient Gender: F               HR:           93 bpm. Exam Location:  Inpatient Procedure: 2D Echo, Cardiac Doppler and Color Doppler Indications:    I50.30* Unspecified diastolic (congestive) heart failure  History:        Patient has prior history of Echocardiogram examinations, most                 recent 03/17/2015. COPD, Signs/Symptoms:Chest Pain and Shortness                 of Breath; Risk Factors:Diabetes and Hypertension. Opiate                 overdose and suicide  attempt.  Sonographer:    Roseanna Rainbow RDCS Referring Phys: [place]  1. Left ventricular ejection fraction, by visual estimation, is 60 to 65%. The left ventricle has normal function. There is moderately increased left ventricular hypertrophy.  2. Left ventricular diastolic parameters are indeterminate.  3. Global right ventricle has normal systolic function.The right ventricular size is normal.  4. Left atrial size was normal.  5. Right atrial size was normal.  6. The mitral valve is normal in structure. Trivial mitral valve regurgitation.  7. The tricuspid valve is normal in structure.  8. The aortic valve is tricuspid. Aortic valve regurgitation is not visualized. No evidence of aortic valve sclerosis or stenosis.  9. The pulmonic valve was not well visualized. Pulmonic valve regurgitation is not visualized. 10. The inferior vena cava is dilated in size with >50% respiratory variability, suggesting right atrial pressure of 8 mmHg. 11. The tricuspid regurgitant velocity is 2.53 m/s, and with an assumed right atrial pressure of 8 mmHg, the estimated right ventricular systolic pressure is mildly elevated at 33.6 mmHg. FINDINGS  Left Ventricle: Left ventricular ejection fraction, by visual estimation, is 60 to 65%. The left ventricle has normal function. The left ventricle has no regional wall motion abnormalities. The left ventricular internal cavity size was the left ventricle is normal in size. There is moderately increased left ventricular hypertrophy. Left ventricular diastolic parameters are indeterminate. Right Ventricle: The right ventricular size is normal. No increase in right ventricular wall thickness. Global RV systolic function is has normal systolic function. The tricuspid regurgitant velocity is 2.53 m/s, and with an assumed right atrial pressure  of 8 mmHg, the estimated right ventricular systolic pressure is mildly elevated at 33.6 mmHg. Left Atrium: Left atrial size was normal  in size. Right Atrium: Right atrial size was normal in size Pericardium: There is no evidence of pericardial effusion. Mitral Valve: The mitral valve is normal in structure. Trivial mitral valve regurgitation. MV peak gradient, 13.5 mmHg. Tricuspid Valve: The tricuspid valve is normal in structure. Tricuspid valve regurgitation is trivial. Aortic Valve: The aortic valve is tricuspid. Aortic valve regurgitation is not visualized. The aortic valve is structurally normal, with no evidence of sclerosis or stenosis. Pulmonic Valve: The pulmonic valve was not well visualized. Pulmonic valve regurgitation is not visualized. Pulmonic regurgitation is not visualized. Aorta: The aortic root and ascending aorta are structurally normal, with  no evidence of dilitation. Venous: The inferior vena cava is dilated in size with greater than 50% respiratory variability, suggesting right atrial pressure of 8 mmHg. IAS/Shunts: The atrial septum is grossly normal.  LEFT VENTRICLE PLAX 2D LVIDd:         3.50 cm       Diastology LVIDs:         2.40 cm       LV e' lateral:   4.03 cm/s LV PW:         1.50 cm       LV E/e' lateral: 21.6 LV IVS:        1.40 cm       LV e' medial:    5.66 cm/s LVOT diam:     1.90 cm       LV E/e' medial:  15.4 LV SV:         31 ml LV SV Index:   21.28 LVOT Area:     2.84 cm  LV Volumes (MOD) LV area d, A2C:    28.10 cm LV area d, A4C:    22.80 cm LV area s, A2C:    15.50 cm LV area s, A4C:    14.05 cm LV major d, A2C:   7.59 cm LV major d, A4C:   6.53 cm LV major s, A2C:   6.22 cm LV major s, A4C:   5.70 cm LV vol d, MOD A2C: 86.1 ml LV vol d, MOD A4C: 65.3 ml LV vol s, MOD A2C: 30.6 ml LV vol s, MOD A4C: 29.1 ml LV SV MOD A2C:     55.5 ml LV SV MOD A4C:     65.3 ml LV SV MOD BP:      49.6 ml RIGHT VENTRICLE             IVC RV S prime:     13.20 cm/s  IVC diam: 2.20 cm TAPSE (M-mode): 2.2 cm LEFT ATRIUM             Index       RIGHT ATRIUM          Index LA diam:        3.20 cm 2.17 cm/m  RA Area:      8.94 cm LA Vol (A2C):   26.3 ml 17.83 ml/m RA Volume:   17.70 ml 12.00 ml/m LA Vol (A4C):   38.0 ml 25.76 ml/m LA Biplane Vol: 31.9 ml 21.62 ml/m  AORTIC VALVE LVOT Vmax:   107.00 cm/s LVOT Vmean:  82.500 cm/s LVOT VTI:    0.235 m  AORTA Ao Root diam: 2.80 cm Ao Asc diam:  3.20 cm MITRAL VALVE                         TRICUSPID VALVE MV Area (PHT): 4.15 cm              TR Peak grad:   25.6 mmHg MV Peak grad:  13.5 mmHg             TR Vmax:        253.00 cm/s MV Mean grad:  3.0 mmHg MV Vmax:       1.84 m/s              SHUNTS MV Vmean:      79.1 cm/s             Systemic VTI:  0.24 m MV VTI:  0.36 m                Systemic Diam: 1.90 cm MV PHT:        53.07 msec MV Decel Time: 183 msec MV E velocity: 86.90 cm/s  103 cm/s MV A velocity: 141.00 cm/s 70.3 cm/s MV E/A ratio:  0.62        1.5  Oswaldo Milian MD Electronically signed by Oswaldo Milian MD Signature Date/Time: 11/04/2019/5:59:05 PM    Final       Subjective: She is feeling better, breathing better  Discharge Exam: Vitals:   11/09/19 0808 11/09/19 0834  BP: (!) 154/79   Pulse: 76 76  Resp:  16  Temp: 98.6 F (37 C)   SpO2: 98% 100%     General: Pt is alert, awake, not in acute distress Cardiovascular: RRR, S1/S2 +, no rubs, no gallops Respiratory: CTA bilaterally, no wheezing, no rhonchi Abdominal: Soft, NT, ND, bowel sounds + Extremities: no edema, no cyanosis    The results of significant diagnostics from this hospitalization (including imaging, microbiology, ancillary and laboratory) are listed below for reference.     Microbiology: Recent Results (from the past 240 hour(s))  SARS CORONAVIRUS 2 (TAT 6-24 HRS) Nasopharyngeal Nasopharyngeal Swab     Status: None   Collection Time: 11/04/19  2:31 AM   Specimen: Nasopharyngeal Swab  Result Value Ref Range Status   SARS Coronavirus 2 NEGATIVE NEGATIVE Final    Comment: (NOTE) SARS-CoV-2 target nucleic acids are NOT DETECTED. The SARS-CoV-2 RNA is  generally detectable in upper and lower respiratory specimens during the acute phase of infection. Negative results do not preclude SARS-CoV-2 infection, do not rule out co-infections with other pathogens, and should not be used as the sole basis for treatment or other patient management decisions. Negative results must be combined with clinical observations, patient history, and epidemiological information. The expected result is Negative. Fact Sheet for Patients: SugarRoll.be Fact Sheet for Healthcare Providers: https://www.woods-mathews.com/ This test is not yet approved or cleared by the [place] FDA and  has been authorized for detection and/or diagnosis of SARS-CoV-2 by FDA under an Emergency Use Authorization (EUA). This EUA will remain  in effect (meaning this test can be used) for the duration of the COVID-19 declaration under Section 56 4(b)(1) of the Act, 21 U.S.C. section 360bbb-3(b)(1), unless the authorization is terminated or revoked sooner. Performed at [facility], [place] [address] [place], [place] [address]   Blood culture (routine x 2)     Status: None   Collection Time: 11/04/19  6:10 AM   Specimen: BLOOD RIGHT WRIST  Result Value Ref Range Status   Specimen Description BLOOD RIGHT WRIST  Final   Special Requests   Final    BOTTLES DRAWN AEROBIC AND ANAEROBIC Blood Culture results may not be optimal due to an inadequate volume of blood received in culture bottles   Culture   Final    NO GROWTH 5 DAYS Performed at [facility], [place] [address] [place], [place] [address]    Report Status 11/09/2019 FINAL  Final  Blood culture (routine x 2)     Status: None   Collection Time: 11/04/19  6:13 AM   Specimen: BLOOD RIGHT HAND  Result Value Ref Range Status   Specimen Description BLOOD RIGHT HAND  Final   Special Requests   Final    BOTTLES DRAWN AEROBIC AND ANAEROBIC Blood Culture results may not be optimal  due to an inadequate  volume of blood received in culture bottles   Culture   Final    NO GROWTH 5 DAYS Performed at [facility], [place] [address] [place], [place] [address]    Report Status 11/09/2019 FINAL  Final  Urine culture     Status: None   Collection Time: 11/04/19  9:11 AM   Specimen: Urine, Random  Result Value Ref Range Status   Specimen Description URINE, RANDOM  Final   Special Requests NONE  Final   Culture   Final    NO GROWTH Performed at [facility], [place] [address], [place], [place] [address]    Report Status 11/05/2019 FINAL  Final  MRSA PCR Screening     Status: None   Collection Time: 11/05/19  8:58 PM   Specimen: Nasal Mucosa; Nasopharyngeal  Result Value Ref Range Status   MRSA by PCR NEGATIVE NEGATIVE Final    Comment:        The GeneXpert MRSA Assay (FDA approved for NASAL specimens only), is one component of a comprehensive MRSA colonization surveillance program. It is not intended to diagnose MRSA infection nor to guide or monitor treatment for MRSA infections. Performed at [facility], [place] [address] [place],  [address]      Labs: BNP (last 3 results) Recent Labs    11/04/19 0341  BNP 3,546.5*   Basic Metabolic Panel: Recent Labs  Lab 11/04/19 0341 11/04/19 0352 11/05/19 0458 11/06/19 0528 11/07/19 0259 11/09/19 0814  NA 141 141 139 135 136 138  K 4.1 4.1 5.0 4.5 4.8 5.1  CL 110 110 106 101 102 106  CO2 21*  --  25 23 22 22   GLUCOSE 145* 137* 104* 93 127* 130*  BUN 23 23 30* 28* 27* 41*  CREATININE 1.43* 1.30* 1.62* 1.56* 1.52* 1.61*  CALCIUM 7.9*  --  8.5* 8.2* 8.3* 8.4*   Liver Function Tests: No results for input(s): AST, ALT, ALKPHOS, BILITOT, PROT, ALBUMIN in the last 168 hours. No results for input(s): LIPASE, AMYLASE in the last 168 hours. No results for input(s): AMMONIA in the last 168 hours. CBC: Recent Labs  Lab 11/05/19 0458 11/06/19 0528 11/07/19 0259 11/08/19 0209 11/09/19 0449   WBC 10.6* 11.9* 8.0 8.3 8.3  NEUTROABS 9.0* 10.3* 7.6 7.8* 7.7  HGB 8.8* 9.5* 9.8* 9.4* 10.3*  HCT 28.1* 31.4* 31.8* 29.8* 33.8*  MCV 91.5 94.3 91.6 91.1 92.6  PLT 330 384 350 390 452*   Cardiac Enzymes: No results for input(s): CKTOTAL, CKMB, CKMBINDEX, TROPONINI in the last 168 hours. BNP: Invalid input(s): POCBNP CBG: Recent Labs  Lab 11/04/19 0342  GLUCAP 132*   D-Dimer No results for input(s): DDIMER in the last 72 hours. Hgb A1c No results for input(s): HGBA1C in the last 72 hours. Lipid Profile No results for input(s): CHOL, HDL, LDLCALC, TRIG, CHOLHDL, LDLDIRECT in the last 72 hours. Thyroid function studies No results for input(s): TSH, T4TOTAL, T3FREE, THYROIDAB in the last 72 hours.  Invalid input(s): FREET3 Anemia work up No results for input(s): VITAMINB12, FOLATE, FERRITIN, TIBC, IRON, RETICCTPCT in the last 72 hours. Urinalysis    Component Value Date/Time   COLORURINE STRAW (A) 11/04/2019 0911   APPEARANCEUR CLEAR 11/04/2019 0911   LABSPEC 1.010 11/04/2019 0911   PHURINE 5.0 11/04/2019 0911   GLUCOSEU NEGATIVE 11/04/2019 0911   HGBUR NEGATIVE 11/04/2019 0911   BILIRUBINUR NEGATIVE 11/04/2019 0911   KETONESUR NEGATIVE 11/04/2019 0911   PROTEINUR NEGATIVE 11/04/2019 0911  UROBILINOGEN 0.2 07/28/2015 2223   NITRITE NEGATIVE 11/04/2019 0911   LEUKOCYTESUR MODERATE (A) 11/04/2019 0911   Sepsis Labs Invalid input(s): PROCALCITONIN,  WBC,  LACTICIDVEN Microbiology Recent Results (from the past 240 hour(s))  SARS CORONAVIRUS 2 (TAT 6-24 HRS) Nasopharyngeal Nasopharyngeal Swab     Status: None   Collection Time: 11/04/19  2:31 AM   Specimen: Nasopharyngeal Swab  Result Value Ref Range Status   SARS Coronavirus 2 NEGATIVE NEGATIVE Final    Comment: (NOTE) SARS-CoV-2 target nucleic acids are NOT DETECTED. The SARS-CoV-2 RNA is generally detectable in upper and lower respiratory specimens during the acute phase of infection. Negative results do not  preclude SARS-CoV-2 infection, do not rule out co-infections with other pathogens, and should not be used as the sole basis for treatment or other patient management decisions. Negative results must be combined with clinical observations, patient history, and epidemiological information. The expected result is Negative. Fact Sheet for Patients: SugarRoll.be Fact Sheet for Healthcare Providers: https://www.woods-mathews.com/ This test is not yet approved or cleared by the [place] FDA and  has been authorized for detection and/or diagnosis of SARS-CoV-2 by FDA under an Emergency Use Authorization (EUA). This EUA will remain  in effect (meaning this test can be used) for the duration of the COVID-19 declaration under Section 56 4(b)(1) of the Act, 21 U.S.C. section 360bbb-3(b)(1), unless the authorization is terminated or revoked sooner. Performed at [facility], [place] [address] [place], [place] [address]   Blood culture (routine x 2)     Status: None   Collection Time: 11/04/19  6:10 AM   Specimen: BLOOD RIGHT WRIST  Result Value Ref Range Status   Specimen Description BLOOD RIGHT WRIST  Final   Special Requests   Final    BOTTLES DRAWN AEROBIC AND ANAEROBIC Blood Culture results may not be optimal due to an inadequate volume of blood received in culture bottles   Culture   Final    NO GROWTH 5 DAYS Performed at [facility], [place] [address] [place], [place] [address]    Report Status 11/09/2019 FINAL  Final  Blood culture (routine x 2)     Status: None   Collection Time: 11/04/19  6:13 AM   Specimen: BLOOD RIGHT HAND  Result Value Ref Range Status   Specimen Description BLOOD RIGHT HAND  Final   Special Requests   Final    BOTTLES DRAWN AEROBIC AND ANAEROBIC Blood Culture results may not be optimal due to an inadequate volume of blood received in culture bottles   Culture   Final    NO GROWTH 5 DAYS Performed at [facility], [place] [address] [place], [place] [address]    Report Status 11/09/2019 FINAL  Final  Urine culture     Status: None   Collection Time: 11/04/19  9:11 AM   Specimen: Urine, Random  Result Value Ref Range Status   Specimen Description URINE, RANDOM  Final   Special Requests NONE  Final   Culture   Final    NO GROWTH Performed at [facility], [place] [address], [place], [place] [address]    Report Status 11/05/2019 FINAL  Final  MRSA PCR Screening     Status: None   Collection Time: 11/05/19  8:58 PM   Specimen: Nasal Mucosa; Nasopharyngeal  Result Value Ref Range Status   MRSA by PCR NEGATIVE NEGATIVE Final    Comment:        The  GeneXpert MRSA Assay (FDA approved for NASAL specimens only), is one component of a comprehensive MRSA colonization surveillance program. It is not intended to diagnose MRSA infection nor to guide or monitor treatment for MRSA infections. Performed at [facility], [place] [address], [place], [place] [address]      Time coordinating discharge: 40 minutes  SIGNED:   Elmarie Shiley, MD  [facility]

## 2019-11-09 NOTE — Evaluation (Addendum)
Occupational Therapy Evaluation Patient Details Name: [name] MRN: [id number] DOB: Feb 11, 1946 Today's Date: 11/09/2019    History of Present Illness Pt is 73 y.o. female with medical history significant of OSA: PVD; Parkinson's; HTN; depression; CAD; COPD; grade 1 diastolic CHF (echo in 3818); chronic pain; and CKD presenting with Shortness of breath.  Admitted with acute resp failure with associated flash pulmonary edema.   Clinical Impression   PTA patient reports independent with ADLs, mobility in home using cane/furniture walking, and limited IADLs (med mgmt, light meal prep) but has friend and aide to assist with heavy IADLS.  She was admitted for above and limited by impaired cognition, decreased activity tolerance, generalized weakness and impaired balance; as well as chronic back pain.  On RA during session with SpO2 93-94% throughout session. She requires min guard for LB ADLs, supervision for basic transfers, supervision for grooming at sink, and min guard for in room mobility (using cane, but also reaching out for UE support with L hand).  Patient engaged in short blessed test, revealing deficits in memory, sequencing and attention, scoring 12/28 (impairments consistent with dementia) and further testing is advised.  Patient reports if her friend is not available, his mother will be there to assist; pt agreeable to recommendations of 24/7 assist and support with medication mgmt/ IADLs.  Patient will benefit from continued OT services while admitted and after dc at [facility] level in order to optimize independence and safety with ADLs, IADls, and mobility.      Follow Up Recommendations  Home health OT;Supervision/Assistance - 24 hour    Equipment Recommendations  None recommended by OT    Recommendations for Other Services       Precautions / Restrictions Precautions Precautions: Fall Restrictions Weight Bearing Restrictions: No      Mobility Bed Mobility                General bed mobility comments: EOB upon entry   Transfers Overall transfer level: Needs assistance Equipment used: None Transfers: Sit to/from Stand Sit to Stand: Supervision         General transfer comment: for safety, increased time     Balance Overall balance assessment: Needs assistance Sitting-balance support: No upper extremity supported;Feet supported Sitting balance-Leahy Scale: Normal Sitting balance - Comments: normal balance at EOB   Standing balance support: Single extremity supported;Bilateral upper extremity supported;During functional activity Standing balance-Leahy Scale: Fair Standing balance comment: patient preference to UE support, using cane dynamically but reaching out for BUE support                            ADL either performed or assessed with clinical judgement   ADL Overall ADL's : Needs assistance/impaired     Grooming: Supervision/safety;Standing   Upper Body Bathing: Set up;Sitting   Lower Body Bathing: Min guard;Sit to/from stand   Upper Body Dressing : Supervision/safety;Set up;Sitting   Lower Body Dressing: Min guard;Sit to/from stand   Toilet Transfer: Min guard;Ambulation(cane) Toilet Transfer Details (indicate cue type and reason): patient reaching out for BUE support during transfers          Functional mobility during ADLs: Min guard;Cane General ADL Comments: patient limited by decreased activity tolerance, impaired balance, impaired STM      Vision Baseline Vision/History: Wears glasses Wears Glasses: At all times Vision Assessment?: No apparent visual deficits     Perception     Praxis      Pertinent  Vitals/Pain Pain Assessment: Faces Faces Pain Scale: Hurts even more Pain Location: Back-chronic Pain Descriptors / Indicators: Discomfort Pain Intervention(s): Monitored during session;Repositioned     Hand Dominance Right   Extremity/Trunk Assessment Upper Extremity Assessment Upper Extremity  Assessment: Generalized weakness   Lower Extremity Assessment Lower Extremity Assessment: Defer to PT evaluation   Cervical / Trunk Assessment Cervical / Trunk Assessment: Kyphotic   Communication Communication Communication: No difficulties   Cognition Arousal/Alertness: Awake/alert Behavior During Therapy: WFL for tasks assessed/performed Overall Cognitive Status: Impaired/Different from baseline Area of Impairment: Attention;Memory;Awareness;Problem solving;Safety/judgement                   Current Attention Level: Sustained Memory: Decreased short-term memory   Safety/Judgement: Decreased awareness of safety Awareness: Emergent Problem Solving: Slow processing;Decreased initiation;Difficulty sequencing;Requires verbal cues General Comments: Short blessed test completed: 12/28 (impairment consistent with dementia) with deficits noted in sequencing and short term memory; further testing recommended    General Comments  pt on RA, SpO2 93-94%    Exercises     Shoulder Instructions      Home Living Family/patient expects to be discharged to:: Private residence Living Arrangements: Other (Comment)(friend ) Available Help at Discharge: Available PRN/intermittently Type of Home: House Home Access: Stairs to enter [employer] of Steps: 2 Entrance Stairs-Rails: Right Home Layout: One level     Bathroom Shower/Tub: Teacher, early years/pre: [place] - single point;Walker - 2 wheels;Shower seat   Additional Comments: patient reports if her friend isn't there, his mom would be there giving her 24/7 support      Prior Functioning/Environment Level of Independence: Needs assistance  Gait / Transfers Assistance Needed: ambulates with cane in home and RW for short community distances ADL's / Homemaking Assistance Needed: aide assist/friend assist with IADLs, independent ADLs, med mgmt and simple meals            OT  Problem List: Decreased strength;Decreased activity tolerance;Impaired balance (sitting and/or standing);Decreased cognition;Decreased knowledge of use of DME or AE;Decreased safety awareness;Decreased knowledge of precautions;Pain      OT Treatment/Interventions: Self-care/ADL training;DME and/or AE instruction;Therapeutic exercise;Therapeutic activities;Cognitive remediation/compensation;Balance training;Patient/family education    OT Goals(Current goals can be found in the care plan section) Acute Rehab OT Goals Patient Stated Goal: go home OT Goal Formulation: With patient Time For Goal Achievement: 11/23/19 Potential to Achieve Goals: Good  OT Frequency: Min 2X/week   Barriers to D/C:            Co-evaluation              AM-PAC OT "6 Clicks" Daily Activity     Outcome Measure Help from another person eating meals?: None Help from another person taking care of personal grooming?: A Little Help from another person toileting, which includes using toliet, bedpan, or urinal?: A Little Help from another person bathing (including washing, rinsing, drying)?: A Little Help from another person to put on and taking off regular upper body clothing?: A Little Help from another person to put on and taking off regular lower body clothing?: A Little 6 Click Score: 19   End of Session Equipment Utilized During Treatment: Other (comment)(cane ) Nurse Communication: Mobility status  Activity Tolerance: Patient tolerated treatment well Patient left: in chair;with call bell/phone within reach;with chair alarm set  OT Visit Diagnosis: Unsteadiness on feet (R26.81);Muscle weakness (generalized) (M62.81);Pain Pain - part of body: (back- chronic )  Time: 0223-3612 OT Time Calculation (min): 21 min Charges:  OT General Charges $OT Visit: 1 Visit OT Evaluation $OT Eval Moderate Complexity: 1 Mod  Jolaine Artist, OT Acute Rehabilitation Services Pager [phone] Office  [phone]   Delight Stare 11/09/2019, 12:42 PM

## 2019-11-09 NOTE — Plan of Care (Signed)
  Problem: Activity: Goal: Risk for activity intolerance will decrease Outcome: Progressing   Problem: Safety: Goal: Ability to remain free from injury will improve Outcome: Progressing   

## 2019-11-09 NOTE — Care Management Important Message (Signed)
Important Message  Patient Details  Name: [name] MRN: [id number] Date of Birth: 1946/07/02   Medicare Important Message Given:  Yes     Shelda Altes 11/09/2019, 12:52 PM

## 2019-11-09 NOTE — Progress Notes (Signed)
Physical Therapy Treatment Patient Details Name: [name] MRN: [id number] DOB: 04/08/1946 Today's Date: 11/09/2019    History of Present Illness Pt is 74 y.o. female with medical history significant of OSA: PVD; Parkinson's; HTN; depression; CAD; COPD; grade 1 diastolic CHF (echo in 4665); chronic pain; and CKD presenting with Shortness of breath.  Admitted with acute resp failure with associated flash pulmonary edema.    PT Comments    Pt admitted for above. Pt with some mild balance deficits but appears to be able to safely navigate her home. Ambulated with both RW and cane today, and encouraged pt to use RW more as it helps her be more steady. Pt states she likes using her cane but mostly furniture walks. Pt became a little short of breath towards end of gait, but was able to ambulate 150 ft in hall without need for oxygen (resting SpO2 94%, ambulatory 91-94%). No LOB or unsteadiness noted. Practiced taking some marching steps with a R rail in hallway to mimic stairs to enter at home, and pt completed this without LOB or unsteadiness. Feel she will be able to enter home safely. [employer] handout to pt. Pt states that she feels comfortable entering and navigating her home without assistance. Pt would benefit from continued skilled acute PT intervention while in the hospital to address deficits, but no PT follow-up is recommended.   Follow Up Recommendations  No PT follow up     Equipment Recommendations  None recommended by PT    Recommendations for Other Services       Precautions / Restrictions Precautions Precautions: Fall Restrictions Weight Bearing Restrictions: No    Mobility  Bed Mobility               General bed mobility comments: sitting EOB upon SPT arrival  Transfers Overall transfer level: Modified independent Equipment used: None Transfers: Sit to/from Stand Sit to Stand: Modified independent (Device/Increase time)         General transfer comment: pt stood up without any physical assistance and only minimal increased time; no AD used  Ambulation/Gait Ambulation/Gait assistance: Supervision Gait Distance (Feet): 150 Feet Assistive device: Rolling walker (2 wheeled);Straight cane Gait Pattern/deviations: Shuffle;Trunk flexed;Decreased stride length;Step-through pattern Gait velocity: decreased   General Gait Details: cues for posture; pt began gait with RW for stability, pt with flexed trunk but steady gait; then used straight cane, and pt demonstrated increased difficulty with ambulation, steps became shorter; continued with RW until practicing marching steps in hallway with R railing; continued walking to chair in room; pt noted back pain towards end of gait   Stairs             Wheelchair Mobility    Modified Rankin (Stroke Patients Only)       Balance Overall balance assessment: Needs assistance Sitting-balance support: No upper extremity supported;Feet supported Sitting balance-Leahy Scale: Normal Sitting balance - Comments: normal balance at EOB   Standing balance support: Single extremity supported;During functional activity Standing balance-Leahy Scale: Fair Standing balance comment: no unsteadiness but increase in back pain so would not tolerate challenge to balance most likely                            Cognition Arousal/Alertness: Awake/alert Behavior During Therapy: WFL for tasks assessed/performed Overall Cognitive Status: Within Functional Limits for tasks assessed  Exercises      General Comments General comments (skin integrity, edema, etc.): SpO2 stable throughout 91-94%      Pertinent Vitals/Pain Pain Assessment: Faces Faces Pain Scale: Hurts little more Pain Location: Back-chronic Pain Descriptors / Indicators: Discomfort Pain Intervention(s): Limited activity within patient's  tolerance;Monitored during session;Repositioned    Home Living                      Prior Function            PT Goals (current goals can now be found in the care plan section) Acute Rehab PT Goals Patient Stated Goal: go home PT Goal Formulation: With patient Time For Goal Achievement: 11/19/19 Potential to Achieve Goals: Good Progress towards PT goals: Progressing toward goals    Frequency    Min 3X/week      PT Plan Current plan remains appropriate    Co-evaluation              AM-PAC PT "6 Clicks" Mobility   Outcome Measure  Help needed turning from your back to your side while in a flat bed without using bedrails?: None Help needed moving from lying on your back to sitting on the side of a flat bed without using bedrails?: None Help needed moving to and from a bed to a chair (including a wheelchair)?: None Help needed standing up from a chair using your arms (e.g., wheelchair or bedside chair)?: None Help needed to walk in hospital room?: None Help needed climbing 3-5 steps with a railing? : A Little 6 Click Score: 23    End of Session Equipment Utilized During Treatment: Gait belt Activity Tolerance: Patient tolerated treatment well Patient left: in chair;with call bell/phone within reach Nurse Communication: Mobility status PT Visit Diagnosis: Other abnormalities of gait and mobility (R26.89);Pain Pain - part of body: (back)     Time: 1015-1040 PT Time Calculation (min) (ACUTE ONLY): 25 min  Charges:  $Gait Training: 23-37 mins                     Christel Mormon, SPT   Silvana Newness 11/09/2019, 12:23 PM

## 2019-11-09 NOTE — TOC Progression Note (Signed)
Transition of Care [facility]) - Progression Note    Patient Details  Name: [name] MRN: [id number] Date of Birth: 1946-01-09  [facility] CM/SW Contact  Zenon Mayo, RN Phone Number: 11/09/2019, 10:26 AM  Clinical Narrative:    Patient is for dc today, no needs.   Expected Discharge Plan: Home/Self Care Barriers to Discharge: No Barriers Identified  Expected Discharge Plan and Services Expected Discharge Plan: Home/Self Care       Living arrangements for the past 2 months: Single Family Home Expected Discharge Date: 11/09/19                                     Social Determinants of Health (SDOH) Interventions    Readmission Risk Interventions No flowsheet data found.

## 2019-11-19 ENCOUNTER — Other Ambulatory Visit: Payer: Self-pay

## 2019-11-19 ENCOUNTER — Inpatient Hospital Stay (HOSPITAL_COMMUNITY)
Admission: EM | Admit: 2019-11-19 | Discharge: 2019-11-24 | DRG: 291 | Disposition: A | Discharge status: Home Health | Payer: Medicare Other | Attending: Internal Medicine | Admitting: Internal Medicine

## 2019-11-19 ENCOUNTER — Emergency Department (HOSPITAL_COMMUNITY): Payer: Medicare Other

## 2019-11-19 ENCOUNTER — Encounter (HOSPITAL_COMMUNITY): Payer: Self-pay

## 2019-11-19 DIAGNOSIS — G20 Parkinson's disease: Secondary | ICD-10-CM | POA: Diagnosis present

## 2019-11-19 DIAGNOSIS — J810 Acute pulmonary edema: Secondary | ICD-10-CM | POA: Diagnosis not present

## 2019-11-19 DIAGNOSIS — E872 Acidosis: Secondary | ICD-10-CM | POA: Diagnosis present

## 2019-11-19 DIAGNOSIS — J9601 Acute respiratory failure with hypoxia: Secondary | ICD-10-CM | POA: Diagnosis present

## 2019-11-19 DIAGNOSIS — Z9049 Acquired absence of other specified parts of digestive tract: Secondary | ICD-10-CM

## 2019-11-19 DIAGNOSIS — I130 Hypertensive heart and chronic kidney disease with heart failure and stage 1 through stage 4 chronic kidney disease, or unspecified chronic kidney disease: Secondary | ICD-10-CM | POA: Diagnosis not present

## 2019-11-19 DIAGNOSIS — E1151 Type 2 diabetes mellitus with diabetic peripheral angiopathy without gangrene: Secondary | ICD-10-CM | POA: Diagnosis present

## 2019-11-19 DIAGNOSIS — E538 Deficiency of other specified B group vitamins: Secondary | ICD-10-CM | POA: Diagnosis present

## 2019-11-19 DIAGNOSIS — E785 Hyperlipidemia, unspecified: Secondary | ICD-10-CM | POA: Diagnosis present

## 2019-11-19 DIAGNOSIS — Z8249 Family history of ischemic heart disease and other diseases of the circulatory system: Secondary | ICD-10-CM

## 2019-11-19 DIAGNOSIS — Z66 Do not resuscitate: Secondary | ICD-10-CM | POA: Diagnosis present

## 2019-11-19 DIAGNOSIS — Z20822 Contact with and (suspected) exposure to covid-19: Secondary | ICD-10-CM | POA: Diagnosis present

## 2019-11-19 DIAGNOSIS — G4730 Sleep apnea, unspecified: Secondary | ICD-10-CM | POA: Diagnosis present

## 2019-11-19 DIAGNOSIS — E1122 Type 2 diabetes mellitus with diabetic chronic kidney disease: Secondary | ICD-10-CM | POA: Diagnosis present

## 2019-11-19 DIAGNOSIS — I161 Hypertensive emergency: Secondary | ICD-10-CM | POA: Diagnosis present

## 2019-11-19 DIAGNOSIS — Z79899 Other long term (current) drug therapy: Secondary | ICD-10-CM

## 2019-11-19 DIAGNOSIS — M797 Fibromyalgia: Secondary | ICD-10-CM | POA: Diagnosis present

## 2019-11-19 DIAGNOSIS — I2510 Atherosclerotic heart disease of native coronary artery without angina pectoris: Secondary | ICD-10-CM | POA: Diagnosis present

## 2019-11-19 DIAGNOSIS — Z7982 Long term (current) use of aspirin: Secondary | ICD-10-CM

## 2019-11-19 DIAGNOSIS — F17210 Nicotine dependence, cigarettes, uncomplicated: Secondary | ICD-10-CM | POA: Diagnosis present

## 2019-11-19 DIAGNOSIS — Z833 Family history of diabetes mellitus: Secondary | ICD-10-CM

## 2019-11-19 DIAGNOSIS — N1831 Chronic kidney disease, stage 3a: Secondary | ICD-10-CM | POA: Diagnosis present

## 2019-11-19 DIAGNOSIS — D631 Anemia in chronic kidney disease: Secondary | ICD-10-CM | POA: Diagnosis present

## 2019-11-19 DIAGNOSIS — E876 Hypokalemia: Secondary | ICD-10-CM | POA: Diagnosis present

## 2019-11-19 DIAGNOSIS — N179 Acute kidney failure, unspecified: Secondary | ICD-10-CM | POA: Diagnosis present

## 2019-11-19 DIAGNOSIS — J449 Chronic obstructive pulmonary disease, unspecified: Secondary | ICD-10-CM | POA: Diagnosis present

## 2019-11-19 DIAGNOSIS — I509 Heart failure, unspecified: Secondary | ICD-10-CM

## 2019-11-19 DIAGNOSIS — G894 Chronic pain syndrome: Secondary | ICD-10-CM | POA: Diagnosis present

## 2019-11-19 DIAGNOSIS — I160 Hypertensive urgency: Secondary | ICD-10-CM | POA: Diagnosis present

## 2019-11-19 DIAGNOSIS — Z823 Family history of stroke: Secondary | ICD-10-CM

## 2019-11-19 DIAGNOSIS — Z8349 Family history of other endocrine, nutritional and metabolic diseases: Secondary | ICD-10-CM

## 2019-11-19 DIAGNOSIS — I5033 Acute on chronic diastolic (congestive) heart failure: Secondary | ICD-10-CM | POA: Diagnosis present

## 2019-11-19 LAB — CBC WITH DIFFERENTIAL/PLATELET
Abs Immature Granulocytes: 0.08 10*3/uL — ABNORMAL HIGH (ref 0.00–0.07)
Basophils Absolute: 0 10*3/uL (ref 0.0–0.1)
Basophils Relative: 0 %
Eosinophils Absolute: 0 10*3/uL (ref 0.0–0.5)
Eosinophils Relative: 0 %
HCT: 33.7 % — ABNORMAL LOW (ref 36.0–46.0)
Hemoglobin: 10 g/dL — ABNORMAL LOW (ref 12.0–15.0)
Immature Granulocytes: 1 %
Lymphocytes Relative: 10 %
Lymphs Abs: 1.7 10*3/uL (ref 0.7–4.0)
MCH: 28.6 pg (ref 26.0–34.0)
MCHC: 29.7 g/dL — ABNORMAL LOW (ref 30.0–36.0)
MCV: 96.3 fL (ref 80.0–100.0)
Monocytes Absolute: 0.7 10*3/uL (ref 0.1–1.0)
Monocytes Relative: 4 %
Neutro Abs: 15.1 10*3/uL — ABNORMAL HIGH (ref 1.7–7.7)
Neutrophils Relative %: 85 %
Platelets: 458 10*3/uL — ABNORMAL HIGH (ref 150–400)
RBC: 3.5 MIL/uL — ABNORMAL LOW (ref 3.87–5.11)
RDW: 17.7 % — ABNORMAL HIGH (ref 11.5–15.5)
WBC: 17.7 10*3/uL — ABNORMAL HIGH (ref 4.0–10.5)
nRBC: 0 % (ref 0.0–0.2)

## 2019-11-19 LAB — RESPIRATORY PANEL BY RT PCR (FLU A&B, COVID)
Influenza A by PCR: NEGATIVE
Influenza B by PCR: NEGATIVE
SARS Coronavirus 2 by RT PCR: NEGATIVE

## 2019-11-19 LAB — POC SARS CORONAVIRUS 2 AG -  ED: SARS Coronavirus 2 Ag: NEGATIVE

## 2019-11-19 LAB — LACTIC ACID, PLASMA: Lactic Acid, Venous: 2.2 mmol/L (ref 0.5–1.9)

## 2019-11-19 MED ORDER — MAGNESIUM SULFATE IN D5W 1-5 GM/100ML-% IV SOLN
1.0000 g | Freq: Once | INTRAVENOUS | Status: DC
  Filled 2019-11-19: qty 100

## 2019-11-19 MED ORDER — MAGNESIUM SULFATE 2 GM/50ML IV SOLN
2.0000 g | Freq: Once | INTRAVENOUS | Status: AC
  Administered 2019-11-19: 23:00:00 2 g via INTRAVENOUS

## 2019-11-19 MED ORDER — NITROGLYCERIN IN D5W 200-5 MCG/ML-% IV SOLN
0.0000 ug/min | INTRAVENOUS | Status: DC
  Administered 2019-11-19: 23:00:00 100 ug/min via INTRAVENOUS
  Administered 2019-11-20: 150 ug/min via INTRAVENOUS
  Filled 2019-11-19 (×3): qty 250

## 2019-11-19 MED ORDER — FUROSEMIDE 10 MG/ML IJ SOLN
40.0000 mg | Freq: Once | INTRAMUSCULAR | Status: AC
  Administered 2019-11-19: 40 mg via INTRAVENOUS
  Filled 2019-11-19: qty 4

## 2019-11-19 NOTE — ED Notes (Signed)
Emergent BIPAP initiated per MD Plunkett. Pt in severe respiratory distress.

## 2019-11-19 NOTE — ED Triage Notes (Signed)
Pt BIB [facility] for eval of SOB Pt w/ hx of CHF and COPD, pt was initially tachypneic to the 40s on their arrival 84% on RA on fire arrival. NRB applied w/ improvement to 96%. EMS admin 125 solumedrol. Pt reports she was [facility] w/ inhaler last discharge a few weeks ago but lost it. Pt in moderate respiratory distress on arrival. #20G to R upper arm. GCS 15

## 2019-11-19 NOTE — ED Provider Notes (Signed)
[facility] EMERGENCY DEPARTMENT Provider Note   CSN: [id number] Arrival date & time: 11/19/19  2234     History Chief Complaint  Patient presents with  . Shortness of Breath    [name] is a 74 y.o. female.  Patient is a 74 year old female with significant medical problems including COPD, ongoing tobacco abuse, CHF and recently Lasix was discontinued on last hospital admission 2 weeks ago, peripheral vascular disease, Parkinson's disease who presents today with worsening shortness of breath.  Due to patient's significant distress with breathing she is not able to give much history.  She is complaining of shortness of breath and a cough but does not know she has had fever or productive cough.  Patient is having swelling but is unclear if she has gained weight.  Nobody is currently present to give further history.  EMS reports when they arrived the patient was satting 80% on room air and was tachypneic.  She received Solu-Medrol in route and was placed on a nonrebreather with improvement in oxygen saturation.  Patient denies any vomiting or diarrhea.  The history is provided by the patient, the EMS personnel and medical records.  Shortness of Breath      Past Medical History:  Diagnosis Date  . Anxiety and depression   . Arthritis   . Asthma   . Carotid artery occlusion   . Chronic kidney disease   . Chronic pain    leg and feet  . COPD (chronic obstructive pulmonary disease) ([place])   . Coronary artery disease   . DDD (degenerative disc disease)   . Diarrhea    chronic   . DVT (deep venous thrombosis) ([place])   . Dyspnea   . Fibromyalgia   . GERD (gastroesophageal reflux disease)   . Grave's disease   . Headache   . Hypertension   . OP (osteoporosis)   . Parkinson's disease ([place])   . Peripheral vascular disease ([place])   . PUD (peptic ulcer disease)   . Recurrent upper respiratory infection (URI)   . Rhinitis   . Sleep apnea   . Spinal stenosis  of lumbar region 04/23/2013  . Thrombophlebitis   . Tobacco use disorder 04/23/2013  . Vitamin B 12 deficiency     Patient Active Problem List   Diagnosis Date Noted  . Acute pulmonary edema (HCC)   . Positive D dimer   . Acute respiratory failure with hypoxia ([place]) 11/04/2019  . Acute exacerbation of CHF (congestive heart failure) ([place]) 11/04/2019  . DNR (do not resuscitate) 11/04/2019  . Chronic pain syndrome 11/04/2019  . Pulmonary nodule, left 08/02/2018  . Major depressive disorder, single episode, moderate ([place]) 08/02/2018  . Benzodiazepine overdose of undetermined intent 08/01/2018  . Cellulitis 05/14/2018  . Anemia 05/14/2018  . At risk for adverse drug event 10/17/2017  . Salicylate intoxication, undetermined intent, subsequent encounter 10/10/2017  . Acute renal failure with acute renal cortical necrosis superimposed on stage 3a chronic kidney disease ([place]) 10/10/2017  . Headache 10/10/2017  . Protein-calorie malnutrition, severe 01/26/2016  . Opiate overdose ([place]) 01/25/2016  . Suicide attempt ([place]) 01/25/2016  . Depression   . Dyslipidemia   . Gastroesophageal reflux disease without esophagitis   . COPD (chronic obstructive pulmonary disease) ([place]) 07/29/2015  . Diabetes mellitus with neurological manifestation ([place]) 07/29/2015  . Frequent falls 07/29/2015  . HTN (hypertension) 07/29/2015  . AKI (acute kidney injury) ([place]) 07/29/2015  . SIRS (systemic inflammatory response syndrome) ([place]) 07/29/2015  . Spinal  stenosis of lumbar region 04/23/2013  . Tremor due to multiple drugs 04/23/2013  . Tobacco use disorder 04/23/2013  . COPD exacerbation ([place]) 04/23/2013  . Occlusion and stenosis of carotid artery without mention of cerebral infarction 03/12/2012  . Viral bronchitis-possible h. infl vs Norovirus 11/21/2011  . Pleuritic chest pain 09/18/2011    Past Surgical History:  Procedure Laterality Date  . CAROTID ENDARTERECTOMY Left Sept. 20,2011   cea  .  CHOLECYSTECTOMY  1999  . GASTRECTOMY     age 43   Part of small intestin and part of stomach  . LEFT HEART CATHETERIZATION WITH CORONARY ANGIOGRAM N/A 10/02/2011   Procedure: LEFT HEART CATHETERIZATION WITH CORONARY ANGIOGRAM;  Surgeon: Sueanne Margarita, MD;  Location: [facility] CATH LAB;  Service: Cardiovascular;  Laterality: N/A;     OB History   No obstetric history on file.     Family History  Problem Relation Age of Onset  . Diabetes Mother   . Hyperlipidemia Mother   . Heart attack Mother   . Other Mother        varicose veins,respiratory,stroke  . Heart disease Mother        before age 75  . Hypertension Mother   . Varicose Veins Mother   . Diabetes Father   . Heart disease Father        before age 51  . Hyperlipidemia Father   . Heart attack Father   . Other Father        varicose veins  . Hypertension Father   . Varicose Veins Father   . Diabetes Sister   . Heart disease Sister        before age 66  . Hyperlipidemia Sister   . Heart attack Sister   . Other Sister        varicose veins  . Hypertension Sister   . Varicose Veins Sister   . Peripheral vascular disease Sister   . Diabetes Sister   . Hyperlipidemia Sister   . Hypertension Sister   . Varicose Veins Sister     Social History   Tobacco Use  . Smoking status: Current Every Day Smoker    Packs/day: 2.00    Years: 50.00    Pack years: 100.00    Types: Cigarettes  . Smokeless tobacco: Never Used  . Tobacco comment: cessation info given and reviewed  Substance Use Topics  . Alcohol use: No    Alcohol/week: 0.0 standard drinks  . Drug use: No    Home Medications Prior to Admission medications   Medication Sig Start Date End Date Taking? Authorizing Provider  albuterol (PROVENTIL) (2.5 MG/3ML) 0.083% nebulizer solution Inhale 3 mLs into the lungs 2 (two) times daily. 11/09/19   Regalado, Belkys A, MD  albuterol (VENTOLIN HFA) 108 (90 Base) MCG/ACT inhaler Inhale 2 puffs into the lungs every 6 (six)  hours as needed for wheezing or shortness of breath. 11/09/19   Regalado, Belkys A, MD  allopurinol (ZYLOPRIM) 300 MG tablet Take 1 tablet (300 mg total) by mouth daily. 11/09/19   Regalado, Belkys A, MD  amLODipine (NORVASC) 10 MG tablet Take 1 tablet (10 mg total) by mouth daily. 11/09/19   Regalado, Belkys A, MD  aspirin 81 MG tablet Take 1 tablet (81 mg total) by mouth daily. Patient not taking: Reported on 11/04/2019 08/04/18   Caren Griffins, MD  atorvastatin (LIPITOR) 20 MG tablet Take 1 tablet (20 mg total) by mouth at bedtime. 11/09/19   Regalado, Belkys A,  MD  diphenhydrAMINE (BENADRYL) 25 mg capsule Take 25 mg by mouth every 6 (six) hours as needed for allergies.    [provider]  doxycycline (VIBRA-TABS) 100 MG tablet Take 1 tablet (100 mg total) by mouth every 12 (twelve) hours. 11/09/19   Regalado, Belkys A, MD  DULoxetine (CYMBALTA) 20 MG capsule Take 20 mg by mouth daily. 10/28/19   [provider]  DULoxetine (CYMBALTA) 60 MG capsule Take 60 mg by mouth 2 (two) times daily. 10/13/19   [provider]  levorphanol (LEVODROMORAN) 2 MG tablet Take 2 mg by mouth 2 (two) times daily. 07/07/18   [provider]  metoprolol succinate (TOPROL-XL) 50 MG 24 hr tablet Take 1 tablet (50 mg total) by mouth daily. Take with or immediately following a meal. 11/10/19   Regalado, Belkys A, MD  Multiple Vitamin (MULTIVITAMIN) capsule Take 1 capsule by mouth daily.    [provider]  nicotine (NICODERM CQ - DOSED IN MG/24 HOURS) 14 mg/24hr patch Place 1 patch (14 mg total) onto the skin daily. 11/09/19   Regalado, Belkys A, MD  oxyCODONE-acetaminophen (PERCOCET) 7.5-325 MG tablet Take 1 tablet by mouth 4 (four) times daily as needed for severe pain.  10/09/19   [provider]  pantoprazole (PROTONIX) 40 MG tablet Take 40 mg by mouth 2 (two) times daily. 10/28/19   [provider]  umeclidinium bromide (INCRUSE ELLIPTA) 62.5 MCG/INH AEPB Inhale 1  puff into the lungs daily. 11/10/19   Regalado, Cassie Freer, MD    Allergies    Patient has no known allergies.  Review of Systems   Review of Systems  Unable to perform ROS: Severe respiratory distress  Respiratory: Positive for shortness of breath.     Physical Exam Updated Vital Signs BP (!) 194/146   Pulse (!) 102   Temp 98.4 F (36.9 C) (Oral)   Resp (!) 31   Ht 5\' 4"  (1.626 m)   Wt 50 kg   SpO2 96%   BMI 18.92 kg/m   Physical Exam Vitals and nursing note reviewed.  Constitutional:      General: She is in acute distress.     Appearance: She is well-developed and normal weight.  HENT:     Head: Normocephalic and atraumatic.     Mouth/Throat:     Mouth: Mucous membranes are dry.  Eyes:     Pupils: Pupils are equal, round, and reactive to light.  Cardiovascular:     Rate and Rhythm: Regular rhythm. Tachycardia present.     Heart sounds: Normal heart sounds. No murmur. No friction rub.  Pulmonary:     Effort: Tachypnea, accessory muscle usage and respiratory distress present.     Breath sounds: Wheezing, rhonchi and rales present.  Abdominal:     General: Bowel sounds are normal. There is no distension.     Palpations: Abdomen is soft.     Tenderness: There is no abdominal tenderness. There is no guarding or rebound.  Musculoskeletal:        General: No tenderness. Normal range of motion.     Right lower leg: Edema present.     Left lower leg: Edema present.     Comments: 3+ pitting edema in the lower extremities.  Also periorbital edema bilaterally  Skin:    General: Skin is warm and dry.     Capillary Refill: Capillary refill takes 2 to 3 seconds.     Findings: No rash.  Neurological:     General:  No focal deficit present.     Mental Status: She is alert and oriented to person, place, and time. Mental status is at baseline.     Cranial Nerves: No cranial nerve deficit.  Psychiatric:        Behavior: Behavior normal.     ED Results / Procedures /  Treatments   Labs (all labs ordered are listed, but only abnormal results are displayed) Labs Reviewed  RESPIRATORY PANEL BY RT PCR (FLU A&B, COVID)  CBC WITH DIFFERENTIAL/PLATELET  COMPREHENSIVE METABOLIC PANEL  BRAIN NATRIURETIC PEPTIDE  LACTIC ACID, PLASMA  URINALYSIS, ROUTINE W REFLEX MICROSCOPIC  POC SARS CORONAVIRUS 2 AG -  ED  TROPONIN I (HIGH SENSITIVITY)    EKG EKG Interpretation  Date/Time:  Thursday November 19 2019 22:46:50 EST Ventricular Rate:  109 PR Interval:    QRS Duration: 85 QT Interval:  342 QTC Calculation: 461 R Axis:   35 Text Interpretation: Sinus tachycardia Probable left ventricular hypertrophy Anterior Q waves, possibly due to LVH Baseline wander in lead(s) III V3 Confirmed by Blanchie Dessert [phone]) on 11/19/2019 10:49:56 PM   Radiology DG Chest Port 1 View  Result Date: 11/19/2019 CLINICAL DATA:  Shortness of breath EXAM: PORTABLE CHEST 1 VIEW COMPARISON:  11/06/2019 FINDINGS: Small right pleural effusion without significant change. Small to moderate left pleural effusion, increased compared to prior. Worsened airspace disease at the left base. Diffuse increased interstitial opacity. Patchy more confluent airspace disease in the right upper lobe. Stable cardiomediastinal silhouette. IMPRESSION: 1. Small moderate left pleural effusion, increased compared to prior with worsened airspace disease at the left base. Stable small right pleural effusion 2. Diffuse increased interstitial opacity since prior, likely worsened edema. More confluent airspace disease at the left base and right upper lobe, cannot exclude superimposed pneumonia. Electronically Signed   By: Donavan Foil M.D.   On: 11/19/2019 23:16    Procedures Procedures (including critical care time)  Medications Ordered in ED Medications  nitroGLYCERIN 50 mg in dextrose 5 % 250 mL (0.2 mg/mL) infusion (150 mcg/min Intravenous Rate/Dose Change 11/19/19 2300)  magnesium sulfate IVPB 2 g 50 mL (2 g  Intravenous New Bag/Given 11/19/19 2304)    ED Course  I have reviewed the triage vital signs and the nursing notes.  Pertinent labs & imaging results that were available during my care of the patient were reviewed by me and considered in my medical decision making (see chart for details).    MDM Rules/Calculators/A&P                      74 year old female presenting to the emergency room tonight in respiratory distress.  Patient has history of COPD, chronic kidney disease and CHF.  Recent leg was discontinued on her Lasix at her last hospital discharge.  Patient is hypertensive 200s over 100s, tachycardic, tachypneic with diffuse rales.  Patient has facial edema, lower extremity edema and concern for fluid overload.  Patient also may be having concurrent COPD exacerbation but suspect most likely flash pulmonary edema.  Patient did have Covid testing done 2 weeks ago that was negative.  Patient did confirm that she was a DNR but was not sure if she was a DNI.  Speaking with her Sister Kennyth Lose she states she believes she would want to be intubated if she had respiratory failure but would not want chest compressions if her heart were to stop.  Patient is denying any chest pain at this time EKG does show mild peaked T  waves and concern for also potential possible worsening renal insufficiency.  Patient received Solu-Medrol by EMS but given her distress on a nonrebreather she was placed on BiPAP.  Covid is pending, labs are pending.  Chest x-ray shows fluid overload.  Patient was started on a nitro drip and given magnesium.  Once BiPAP started and nitro drip started patient looked much more comfortable.  She is now breathing 20 times a minute with 100% oxygen saturation and blood pressure is now 140s over 70s.  Patient was checked out to Dr. Lanny Cramp who will follow up on labs and admit patient for above findings.  CRITICAL CARE Performed by: Eldo Umanzor Total critical care time: 30 minutes Critical  care time was exclusive of separately billable procedures and treating other patients. Critical care was necessary to treat or prevent imminent or life-threatening deterioration. Critical care was time spent personally by me on the following activities: development of treatment plan with patient and/or surrogate as well as nursing, discussions with consultants, evaluation of patient's response to treatment, examination of patient, obtaining history from patient or surrogate, ordering and performing treatments and interventions, ordering and review of laboratory studies, ordering and review of radiographic studies, pulse oximetry and re-evaluation of patient's condition.  Final Clinical Impression(s) / ED Diagnoses Final diagnoses:  None    Rx / DC Orders ED Discharge Orders    None       Blanchie Dessert, MD 11/20/19 0002

## 2019-11-20 ENCOUNTER — Encounter (HOSPITAL_COMMUNITY): Payer: Self-pay | Admitting: Internal Medicine

## 2019-11-20 DIAGNOSIS — F17210 Nicotine dependence, cigarettes, uncomplicated: Secondary | ICD-10-CM | POA: Diagnosis present

## 2019-11-20 DIAGNOSIS — I5033 Acute on chronic diastolic (congestive) heart failure: Secondary | ICD-10-CM | POA: Diagnosis present

## 2019-11-20 DIAGNOSIS — D631 Anemia in chronic kidney disease: Secondary | ICD-10-CM | POA: Diagnosis present

## 2019-11-20 DIAGNOSIS — Z833 Family history of diabetes mellitus: Secondary | ICD-10-CM | POA: Diagnosis not present

## 2019-11-20 DIAGNOSIS — E1151 Type 2 diabetes mellitus with diabetic peripheral angiopathy without gangrene: Secondary | ICD-10-CM | POA: Diagnosis present

## 2019-11-20 DIAGNOSIS — I160 Hypertensive urgency: Secondary | ICD-10-CM

## 2019-11-20 DIAGNOSIS — E872 Acidosis: Secondary | ICD-10-CM | POA: Diagnosis present

## 2019-11-20 DIAGNOSIS — I161 Hypertensive emergency: Secondary | ICD-10-CM | POA: Diagnosis present

## 2019-11-20 DIAGNOSIS — E785 Hyperlipidemia, unspecified: Secondary | ICD-10-CM | POA: Diagnosis present

## 2019-11-20 DIAGNOSIS — E876 Hypokalemia: Secondary | ICD-10-CM | POA: Diagnosis present

## 2019-11-20 DIAGNOSIS — J810 Acute pulmonary edema: Secondary | ICD-10-CM

## 2019-11-20 DIAGNOSIS — E538 Deficiency of other specified B group vitamins: Secondary | ICD-10-CM | POA: Diagnosis present

## 2019-11-20 DIAGNOSIS — I2510 Atherosclerotic heart disease of native coronary artery without angina pectoris: Secondary | ICD-10-CM | POA: Diagnosis present

## 2019-11-20 DIAGNOSIS — N1831 Chronic kidney disease, stage 3a: Secondary | ICD-10-CM | POA: Diagnosis present

## 2019-11-20 DIAGNOSIS — Z66 Do not resuscitate: Secondary | ICD-10-CM | POA: Diagnosis not present

## 2019-11-20 DIAGNOSIS — G894 Chronic pain syndrome: Secondary | ICD-10-CM | POA: Diagnosis present

## 2019-11-20 DIAGNOSIS — I130 Hypertensive heart and chronic kidney disease with heart failure and stage 1 through stage 4 chronic kidney disease, or unspecified chronic kidney disease: Secondary | ICD-10-CM | POA: Diagnosis present

## 2019-11-20 DIAGNOSIS — E1122 Type 2 diabetes mellitus with diabetic chronic kidney disease: Secondary | ICD-10-CM | POA: Diagnosis present

## 2019-11-20 DIAGNOSIS — J449 Chronic obstructive pulmonary disease, unspecified: Secondary | ICD-10-CM | POA: Diagnosis present

## 2019-11-20 DIAGNOSIS — J9601 Acute respiratory failure with hypoxia: Secondary | ICD-10-CM | POA: Diagnosis present

## 2019-11-20 DIAGNOSIS — G4730 Sleep apnea, unspecified: Secondary | ICD-10-CM | POA: Diagnosis present

## 2019-11-20 DIAGNOSIS — Z9049 Acquired absence of other specified parts of digestive tract: Secondary | ICD-10-CM | POA: Diagnosis not present

## 2019-11-20 DIAGNOSIS — Z20822 Contact with and (suspected) exposure to covid-19: Secondary | ICD-10-CM | POA: Diagnosis present

## 2019-11-20 DIAGNOSIS — M797 Fibromyalgia: Secondary | ICD-10-CM | POA: Diagnosis present

## 2019-11-20 DIAGNOSIS — G20 Parkinson's disease: Secondary | ICD-10-CM | POA: Diagnosis present

## 2019-11-20 DIAGNOSIS — N179 Acute kidney failure, unspecified: Secondary | ICD-10-CM | POA: Diagnosis present

## 2019-11-20 LAB — BRAIN NATRIURETIC PEPTIDE: B Natriuretic Peptide: 3431.8 pg/mL — ABNORMAL HIGH (ref 0.0–100.0)

## 2019-11-20 LAB — COMPREHENSIVE METABOLIC PANEL
ALT: 75 U/L — ABNORMAL HIGH (ref 0–44)
ALT: 55 U/L — ABNORMAL HIGH (ref 0–44)
AST: 105 U/L — ABNORMAL HIGH (ref 15–41)
AST: 59 U/L — ABNORMAL HIGH (ref 15–41)
Albumin: 3.2 g/dL — ABNORMAL LOW (ref 3.5–5.0)
Albumin: 2.8 g/dL — ABNORMAL LOW (ref 3.5–5.0)
Alkaline Phosphatase: 383 U/L — ABNORMAL HIGH (ref 38–126)
Alkaline Phosphatase: 301 U/L — ABNORMAL HIGH (ref 38–126)
Anion gap: 16 — ABNORMAL HIGH (ref 5–15)
Anion gap: 16 — ABNORMAL HIGH (ref 5–15)
BUN: 30 mg/dL — ABNORMAL HIGH (ref 8–23)
BUN: 30 mg/dL — ABNORMAL HIGH (ref 8–23)
CO2: 17 mmol/L — ABNORMAL LOW (ref 22–32)
CO2: 16 mmol/L — ABNORMAL LOW (ref 22–32)
Calcium: 8.5 mg/dL — ABNORMAL LOW (ref 8.9–10.3)
Calcium: 8.1 mg/dL — ABNORMAL LOW (ref 8.9–10.3)
Chloride: 109 mmol/L (ref 98–111)
Chloride: 106 mmol/L (ref 98–111)
Creatinine, Ser: 2 mg/dL — ABNORMAL HIGH (ref 0.44–1.00)
Creatinine, Ser: 2.05 mg/dL — ABNORMAL HIGH (ref 0.44–1.00)
GFR calc Af Amer: 28 mL/min — ABNORMAL LOW (ref >60)
GFR calc Af Amer: 27 mL/min — ABNORMAL LOW (ref >60)
GFR calc non Af Amer: 24 mL/min — ABNORMAL LOW (ref >60)
GFR calc non Af Amer: 23 mL/min — ABNORMAL LOW (ref >60)
Glucose, Bld: 87 mg/dL (ref 70–99)
Glucose, Bld: 136 mg/dL — ABNORMAL HIGH (ref 70–99)
Potassium: 5 mmol/L (ref 3.5–5.1)
Potassium: 4.7 mmol/L (ref 3.5–5.1)
Sodium: 142 mmol/L (ref 135–145)
Sodium: 138 mmol/L (ref 135–145)
Total Bilirubin: 0.8 mg/dL (ref 0.3–1.2)
Total Bilirubin: 1.2 mg/dL (ref 0.3–1.2)
Total Protein: 6.5 g/dL (ref 6.5–8.1)
Total Protein: 5.6 g/dL — ABNORMAL LOW (ref 6.5–8.1)

## 2019-11-20 LAB — CBC WITH DIFFERENTIAL/PLATELET
Abs Immature Granulocytes: 0.03 10*3/uL (ref 0.00–0.07)
Basophils Absolute: 0 10*3/uL (ref 0.0–0.1)
Basophils Relative: 0 %
Eosinophils Absolute: 0 10*3/uL (ref 0.0–0.5)
Eosinophils Relative: 0 %
HCT: 27.7 % — ABNORMAL LOW (ref 36.0–46.0)
Hemoglobin: 8.2 g/dL — ABNORMAL LOW (ref 12.0–15.0)
Immature Granulocytes: 0 %
Lymphocytes Relative: 3 %
Lymphs Abs: 0.3 10*3/uL — ABNORMAL LOW (ref 0.7–4.0)
MCH: 28.3 pg (ref 26.0–34.0)
MCHC: 29.6 g/dL — ABNORMAL LOW (ref 30.0–36.0)
MCV: 95.5 fL (ref 80.0–100.0)
Monocytes Absolute: 0 10*3/uL — ABNORMAL LOW (ref 0.1–1.0)
Monocytes Relative: 1 %
Neutro Abs: 7.9 10*3/uL — ABNORMAL HIGH (ref 1.7–7.7)
Neutrophils Relative %: 96 %
Platelets: 336 10*3/uL (ref 150–400)
RBC: 2.9 MIL/uL — ABNORMAL LOW (ref 3.87–5.11)
RDW: 17.6 % — ABNORMAL HIGH (ref 11.5–15.5)
WBC: 8.2 10*3/uL (ref 4.0–10.5)
nRBC: 0 % (ref 0.0–0.2)

## 2019-11-20 LAB — STREP PNEUMONIAE URINARY ANTIGEN: Strep Pneumo Urinary Antigen: NEGATIVE

## 2019-11-20 LAB — PROCALCITONIN: Procalcitonin: 6.47 ng/mL

## 2019-11-20 LAB — TROPONIN I (HIGH SENSITIVITY)
Troponin I (High Sensitivity): 155 ng/L (ref <18)
Troponin I (High Sensitivity): 179 ng/L (ref <18)
Troponin I (High Sensitivity): 208 ng/L (ref <18)
Troponin I (High Sensitivity): 219 ng/L (ref <18)

## 2019-11-20 LAB — TSH: TSH: 0.392 u[IU]/mL (ref 0.350–4.500)

## 2019-11-20 MED ORDER — BUDESONIDE 0.25 MG/2ML IN SUSP
0.2500 mg | Freq: Two times a day (BID) | RESPIRATORY_TRACT | Status: DC
  Administered 2019-11-20 – 2019-11-24 (×8): 0.25 mg via RESPIRATORY_TRACT
  Filled 2019-11-20 (×10): qty 2

## 2019-11-20 MED ORDER — UMECLIDINIUM BROMIDE 62.5 MCG/INH IN AEPB: 1.0000 | INHALATION_SPRAY | Freq: Every day | RESPIRATORY_TRACT | Status: DC

## 2019-11-20 MED ORDER — SODIUM CHLORIDE 0.9 % IV SOLN
2.0000 g | INTRAVENOUS | Status: DC
  Administered 2019-11-20: 07:00:00 2 g via INTRAVENOUS
  Filled 2019-11-20 (×2): qty 20

## 2019-11-20 MED ORDER — DULOXETINE HCL 60 MG PO CPEP
60.0000 mg | ORAL_CAPSULE | Freq: Two times a day (BID) | ORAL | Status: DC
  Administered 2019-11-20 – 2019-11-24 (×9): 60 mg via ORAL
  Filled 2019-11-20 (×10): qty 1

## 2019-11-20 MED ORDER — ADULT MULTIVITAMIN W/MINERALS CH
1.0000 | ORAL_TABLET | Freq: Every day | ORAL | Status: DC
  Administered 2019-11-20 – 2019-11-24 (×5): 1 via ORAL
  Filled 2019-11-20 (×5): qty 1

## 2019-11-20 MED ORDER — ASPIRIN EC 81 MG PO TBEC
81.0000 mg | DELAYED_RELEASE_TABLET | Freq: Every day | ORAL | Status: DC
  Administered 2019-11-20 – 2019-11-24 (×5): 81 mg via ORAL
  Filled 2019-11-20 (×5): qty 1

## 2019-11-20 MED ORDER — ONDANSETRON HCL 4 MG/2ML IJ SOLN: 4.0000 mg | Freq: Four times a day (QID) | INTRAMUSCULAR | Status: DC | PRN

## 2019-11-20 MED ORDER — IPRATROPIUM-ALBUTEROL 0.5-2.5 (3) MG/3ML IN SOLN
3.0000 mL | Freq: Four times a day (QID) | RESPIRATORY_TRACT | Status: DC
  Administered 2019-11-20 (×2): 3 mL via RESPIRATORY_TRACT
  Filled 2019-11-20 (×2): qty 3

## 2019-11-20 MED ORDER — ONDANSETRON HCL 4 MG PO TABS: 4.0000 mg | ORAL_TABLET | Freq: Four times a day (QID) | ORAL | Status: DC | PRN

## 2019-11-20 MED ORDER — ACETAMINOPHEN 650 MG RE SUPP: 650.0000 mg | Freq: Four times a day (QID) | RECTAL | Status: DC | PRN

## 2019-11-20 MED ORDER — ATORVASTATIN CALCIUM 10 MG PO TABS
20.0000 mg | ORAL_TABLET | Freq: Every day | ORAL | Status: DC
  Administered 2019-11-20 – 2019-11-23 (×4): 20 mg via ORAL
  Filled 2019-11-20 (×4): qty 2

## 2019-11-20 MED ORDER — SODIUM CHLORIDE 0.9 % IV SOLN
500.0000 mg | INTRAVENOUS | Status: DC
  Administered 2019-11-20: 08:00:00 500 mg via INTRAVENOUS
  Filled 2019-11-20: qty 500

## 2019-11-20 MED ORDER — METOPROLOL SUCCINATE ER 50 MG PO TB24
50.0000 mg | ORAL_TABLET | Freq: Every day | ORAL | Status: DC
  Administered 2019-11-20 – 2019-11-24 (×5): 50 mg via ORAL
  Filled 2019-11-20 (×2): qty 1
  Filled 2019-11-20: qty 2
  Filled 2019-11-20 (×2): qty 1

## 2019-11-20 MED ORDER — FUROSEMIDE 10 MG/ML IJ SOLN
40.0000 mg | Freq: Two times a day (BID) | INTRAMUSCULAR | Status: DC
  Administered 2019-11-20 – 2019-11-23 (×7): 40 mg via INTRAVENOUS
  Filled 2019-11-20 (×7): qty 4

## 2019-11-20 MED ORDER — AMLODIPINE BESYLATE 10 MG PO TABS
10.0000 mg | ORAL_TABLET | Freq: Every day | ORAL | Status: DC
  Administered 2019-11-20 – 2019-11-24 (×5): 10 mg via ORAL
  Filled 2019-11-20 (×3): qty 1
  Filled 2019-11-20: qty 2
  Filled 2019-11-20: qty 1

## 2019-11-20 MED ORDER — ENOXAPARIN SODIUM 30 MG/0.3ML ~~LOC~~ SOLN
30.0000 mg | SUBCUTANEOUS | Status: DC
  Administered 2019-11-20: 30 mg via SUBCUTANEOUS
  Filled 2019-11-20: qty 0.3

## 2019-11-20 MED ORDER — ALLOPURINOL 300 MG PO TABS
300.0000 mg | ORAL_TABLET | Freq: Every day | ORAL | Status: DC
  Administered 2019-11-20 – 2019-11-24 (×5): 300 mg via ORAL
  Filled 2019-11-20 (×5): qty 1

## 2019-11-20 MED ORDER — PANTOPRAZOLE SODIUM 40 MG PO TBEC
40.0000 mg | DELAYED_RELEASE_TABLET | Freq: Two times a day (BID) | ORAL | Status: DC
  Administered 2019-11-20 – 2019-11-24 (×9): 40 mg via ORAL
  Filled 2019-11-20 (×9): qty 1

## 2019-11-20 MED ORDER — LEVORPHANOL TARTRATE 2 MG PO TABS: 2.0000 mg | ORAL_TABLET | Freq: Two times a day (BID) | ORAL | Status: DC

## 2019-11-20 MED ORDER — ACETAMINOPHEN 325 MG PO TABS
650.0000 mg | ORAL_TABLET | Freq: Four times a day (QID) | ORAL | Status: DC | PRN
  Administered 2019-11-21: 16:00:00 650 mg via ORAL
  Filled 2019-11-20: qty 2

## 2019-11-20 MED ORDER — ALBUTEROL SULFATE (2.5 MG/3ML) 0.083% IN NEBU: 3.0000 mL | INHALATION_SOLUTION | Freq: Four times a day (QID) | RESPIRATORY_TRACT | Status: DC | PRN

## 2019-11-20 MED ORDER — IPRATROPIUM-ALBUTEROL 0.5-2.5 (3) MG/3ML IN SOLN
3.0000 mL | RESPIRATORY_TRACT | Status: DC
  Administered 2019-11-20: 08:00:00 3 mL via RESPIRATORY_TRACT

## 2019-11-20 MED ORDER — OXYCODONE-ACETAMINOPHEN 7.5-325 MG PO TABS
1.0000 | ORAL_TABLET | Freq: Four times a day (QID) | ORAL | Status: DC | PRN
  Administered 2019-11-20 – 2019-11-24 (×5): 1 via ORAL
  Filled 2019-11-20 (×5): qty 1

## 2019-11-20 NOTE — ED Notes (Signed)
Patient states she can;t find her glasses patient hasn't had any glasses since I resumed care at 3am. Bed linens changed and no glasses were in the bed. No skin breakdown. purewick in place. Warm blankets applied. Looked through all patient belongings no glasses. Were found. Patient spoke with sister on the phone.

## 2019-11-20 NOTE — ED Notes (Signed)
Pt trialed on 6LPM [place] from bipap per MD Delo orders.

## 2019-11-20 NOTE — ED Provider Notes (Signed)
Care assumed from Dr. Maryan Rued at shift change.  Patient with history of CHF and COPD presenting with complaints of dyspnea.  She was recently admitted for an exacerbation of COPD during which time her Lasix was discontinued.  Patient's breathing worsened this evening and was brought here by EMS with oxygen saturations of 80% and moderate respiratory distress.  Patient was placed on BiPAP upon arrival and started on a nitroglycerin drip.  Laboratory studies and Covid testing were ordered.  Care signed out to me awaiting results of the laboratory tests, her response to BiPAP, and determining the final disposition.  Work-up shows elevated BNP of 3500 and findings consistent with CHF on her chest x-ray.  Patient given IV Lasix.  After approximately 90 minutes on the BiPAP, her breathing improved and blood pressure and vital signs normalized.  Patient placed on oxygen by 6 L nasal cannula and seems to be tolerating this well at the time of this dictation.  I have spoken with Dr. Hal Hope who agrees to admit.  CRITICAL CARE Performed by: Veryl Speak Total critical care time: 35 minutes Critical care time was exclusive of separately billable procedures and treating other patients. Critical care was necessary to treat or prevent imminent or life-threatening deterioration. Critical care was time spent personally by me on the following activities: development of treatment plan with patient and/or surrogate as well as nursing, discussions with consultants, evaluation of patient's response to treatment, examination of patient, obtaining history from patient or surrogate, ordering and performing treatments and interventions, ordering and review of laboratory studies, ordering and review of radiographic studies, pulse oximetry and re-evaluation of patient's condition.    Veryl Speak, MD 11/20/19 [phone]

## 2019-11-20 NOTE — ED Notes (Signed)
Lunch Tray Ordered@ 1025.

## 2019-11-20 NOTE — H&P (Addendum)
History and Physical    MARRIETTA THUNDER FVC:[id number] DOB: 1946-09-05 DOA: 11/19/2019  PCP: [name], MD  Patient coming from: Home.  Chief Complaint: Shortness of breath.  HPI: [name] is a 74 y.o. female with history of COPD, CHF last EF measured this month was 27 to 35%, hypertension, chronic kidney disease stage III recently admitted for CHF exacerbation presents to the ER with complaint of of worsening shortness of breath last few days.  Patient states she was doing well after discharge but gradually noticed increasing swelling in the lower extremities and worsening shortness of breath last few days.  Denies chest pain or productive cough fever chills.  ED Course: In the ER on exam patient has bilateral lower extremity edema with some puffiness of the eyes chest x-ray shows left-sided pleural effusion moderate in size blood pressure is markedly elevated with systolic more than 638.  EKG shows sinus tachycardia.  Labs show worsening of creatinine from 1.62 LFTs are elevated.  BNP is 3400 high sensitive troponin I 55 lactic acid 2.2 with leukocytosis.  Chest x-ray was also concerning for possible infiltrates.  Patient was given 40 mg IV Lasix started on nitroglycerin infusion admitted for further management of acute respiratory failure likely from CHF.  Covid test was negative.  Her hemoglobin is around 10 which is at baseline.   Review of Systems: As per HPI, rest all negative.   Past Medical History:  Diagnosis Date  . Anxiety and depression   . Arthritis   . Asthma   . Carotid artery occlusion   . Chronic kidney disease   . Chronic pain    leg and feet  . COPD (chronic obstructive pulmonary disease) ([place])   . Coronary artery disease   . DDD (degenerative disc disease)   . Diarrhea    chronic   . DVT (deep venous thrombosis) ([place])   . Dyspnea   . Fibromyalgia   . GERD (gastroesophageal reflux disease)   . Grave's disease   . Headache   . Hypertension   .  OP (osteoporosis)   . Parkinson's disease ([place])   . Peripheral vascular disease ([place])   . PUD (peptic ulcer disease)   . Recurrent upper respiratory infection (URI)   . Rhinitis   . Sleep apnea   . Spinal stenosis of lumbar region 04/23/2013  . Thrombophlebitis   . Tobacco use disorder 04/23/2013  . Vitamin B 12 deficiency     Past Surgical History:  Procedure Laterality Date  . CAROTID ENDARTERECTOMY Left Sept. 20,2011   cea  . CHOLECYSTECTOMY  1999  . GASTRECTOMY     age 89   Part of small intestin and part of stomach  . LEFT HEART CATHETERIZATION WITH CORONARY ANGIOGRAM N/A 10/02/2011   Procedure: LEFT HEART CATHETERIZATION WITH CORONARY ANGIOGRAM;  Surgeon: Sueanne Margarita, MD;  Location: [facility];  Service: Cardiovascular;  Laterality: N/A;     reports that she has been smoking cigarettes. She has a 100.00 pack-year smoking history. She has never used smokeless tobacco. She reports that she does not drink alcohol or use drugs.  No Known Allergies  Family History  Problem Relation Age of Onset  . Diabetes Mother   . Hyperlipidemia Mother   . Heart attack Mother   . Other Mother        varicose veins,respiratory,stroke  . Heart disease Mother        before age 13  . Hypertension Mother   .  Varicose Veins Mother   . Diabetes Father   . Heart disease Father        before age 69  . Hyperlipidemia Father   . Heart attack Father   . Other Father        varicose veins  . Hypertension Father   . Varicose Veins Father   . Diabetes Sister   . Heart disease Sister        before age 96  . Hyperlipidemia Sister   . Heart attack Sister   . Other Sister        varicose veins  . Hypertension Sister   . Varicose Veins Sister   . Peripheral vascular disease Sister   . Diabetes Sister   . Hyperlipidemia Sister   . Hypertension Sister   . Varicose Veins Sister     Prior to Admission medications   Medication Sig Start Date End Date Taking? Authorizing Provider    albuterol (PROVENTIL) (2.5 MG/3ML) 0.083% nebulizer solution Inhale 3 mLs into the lungs 2 (two) times daily. 11/09/19  Yes Regalado, Belkys A, MD  albuterol (VENTOLIN HFA) 108 (90 Base) MCG/ACT inhaler Inhale 2 puffs into the lungs every 6 (six) hours as needed for wheezing or shortness of breath. 11/09/19  Yes Regalado, Belkys A, MD  allopurinol (ZYLOPRIM) 300 MG tablet Take 1 tablet (300 mg total) by mouth daily. 11/09/19  Yes Regalado, Belkys A, MD  amLODipine (NORVASC) 10 MG tablet Take 1 tablet (10 mg total) by mouth daily. 11/09/19  Yes Regalado, Belkys A, MD  atorvastatin (LIPITOR) 20 MG tablet Take 1 tablet (20 mg total) by mouth at bedtime. 11/09/19  Yes Regalado, Belkys A, MD  diphenhydrAMINE (BENADRYL) 25 mg capsule Take 25 mg by mouth every 6 (six) hours as needed for allergies.   Yes [provider]  DULoxetine (CYMBALTA) 20 MG capsule Take 20 mg by mouth daily. 10/28/19  Yes [provider]  DULoxetine (CYMBALTA) 60 MG capsule Take 60 mg by mouth 2 (two) times daily. 10/13/19  Yes [provider]  levorphanol (LEVODROMORAN) 2 MG tablet Take 2 mg by mouth 2 (two) times daily. 07/07/18  Yes [provider]  metoprolol succinate (TOPROL-XL) 50 MG 24 hr tablet Take 1 tablet (50 mg total) by mouth daily. Take with or immediately following a meal. 11/10/19  Yes Regalado, Belkys A, MD  Multiple Vitamin (MULTIVITAMIN) capsule Take 1 capsule by mouth daily.   Yes [provider]  nicotine (NICODERM CQ - DOSED IN MG/24 HOURS) 14 mg/24hr patch Place 1 patch (14 mg total) onto the skin daily. 11/09/19  Yes Regalado, Belkys A, MD  oxyCODONE-acetaminophen (PERCOCET) 7.5-325 MG tablet Take 1 tablet by mouth 4 (four) times daily as needed for severe pain.  10/09/19  Yes [provider]  pantoprazole (PROTONIX) 40 MG tablet Take 40 mg by mouth 2 (two) times daily. 10/28/19  Yes [provider]  umeclidinium bromide (INCRUSE ELLIPTA) 62.5 MCG/INH  AEPB Inhale 1 puff into the lungs daily. 11/10/19  Yes Regalado, Belkys A, MD  aspirin 81 MG tablet Take 1 tablet (81 mg total) by mouth daily. Patient not taking: Reported on 11/04/2019 08/04/18   Caren Griffins, MD  doxycycline (VIBRA-TABS) 100 MG tablet Take 1 tablet (100 mg total) by mouth every 12 (twelve) hours. Patient not taking: Reported on 11/20/2019 11/09/19   Elmarie Shiley, MD    Physical Exam: Constitutional: Moderately built and nourished. Vitals:   11/20/19 0230 11/20/19 0300 11/20/19 0330 11/20/19  0400  BP: (!) 157/77 (!) 151/75 136/71 137/70  Pulse: 89 88 89 88  Resp: 20 20 20 20   Temp:      TempSrc:      SpO2: 95% 97% 98% 100%  Weight:      Height:       Eyes: Anicteric no pallor. ENMT: No discharge from the ears eyes nose or mouth. Neck: No mass felt.  No neck rigidity. Respiratory: No rhonchi or crepitations. Cardiovascular: S1-S2 heard. Abdomen: Soft nontender bowel sounds present. Musculoskeletal: Bilateral lower extremity edema present. Skin: No rash. Neurologic: Alert awake oriented to time place and person.  Moves all extremities. Psychiatric: Appears normal but normal affect.   Labs on Admission: I have personally reviewed following labs and imaging studies  CBC: Recent Labs  Lab 11/19/19 2306  WBC 17.7*  NEUTROABS 15.1*  HGB 10.0*  HCT 33.7*  MCV 96.3  PLT 726*   Basic Metabolic Panel: Recent Labs  Lab 11/19/19 2306  NA 142  K 5.0  CL 109  CO2 17*  GLUCOSE 87  BUN 30*  CREATININE 2.00*  CALCIUM 8.5*   GFR: Estimated Creatinine Clearance: 19.5 mL/min (A) (by C-G formula based on SCr of 2 mg/dL (H)). Liver Function Tests: Recent Labs  Lab 11/19/19 2306  AST 105*  ALT 75*  ALKPHOS 383*  BILITOT 0.8  PROT 6.5  ALBUMIN 3.2*   No results for input(s): LIPASE, AMYLASE in the last 168 hours. No results for input(s): AMMONIA in the last 168 hours. Coagulation Profile: No results for input(s): INR, PROTIME in the last 168  hours. Cardiac Enzymes: No results for input(s): CKTOTAL, CKMB, CKMBINDEX, TROPONINI in the last 168 hours. BNP (last 3 results) No results for input(s): PROBNP in the last 8760 hours. HbA1C: No results for input(s): HGBA1C in the last 72 hours. CBG: No results for input(s): GLUCAP in the last 168 hours. Lipid Profile: No results for input(s): CHOL, HDL, LDLCALC, TRIG, CHOLHDL, LDLDIRECT in the last 72 hours. Thyroid Function Tests: No results for input(s): TSH, T4TOTAL, FREET4, T3FREE, THYROIDAB in the last 72 hours. Anemia Panel: No results for input(s): VITAMINB12, FOLATE, FERRITIN, TIBC, IRON, RETICCTPCT in the last 72 hours. Urine analysis:    Component Value Date/Time   COLORURINE STRAW (A) 11/04/2019 0911   APPEARANCEUR CLEAR 11/04/2019 0911   LABSPEC 1.010 11/04/2019 0911   PHURINE 5.0 11/04/2019 0911   GLUCOSEU NEGATIVE 11/04/2019 0911   HGBUR NEGATIVE 11/04/2019 0911   BILIRUBINUR NEGATIVE 11/04/2019 0911   KETONESUR NEGATIVE 11/04/2019 0911   PROTEINUR NEGATIVE 11/04/2019 0911   UROBILINOGEN 0.2 07/28/2015 2223   NITRITE NEGATIVE 11/04/2019 0911   LEUKOCYTESUR MODERATE (A) 11/04/2019 0911   Sepsis Labs: @LABRCNTIP (procalcitonin:4,lacticidven:4) ) Recent Results (from the past 240 hour(s))  Respiratory Panel by RT PCR (Flu A&B, Covid) - Nasopharyngeal Swab     Status: None   Collection Time: 11/19/19 10:46 PM   Specimen: Nasopharyngeal Swab  Result Value Ref Range Status   SARS Coronavirus 2 by RT PCR NEGATIVE NEGATIVE Final    Comment: (NOTE) SARS-CoV-2 target nucleic acids are NOT DETECTED. The SARS-CoV-2 RNA is generally detectable in upper respiratoy specimens during the acute phase of infection. The lowest concentration of SARS-CoV-2 viral copies this assay can detect is 131 copies/mL. A negative result does not preclude SARS-Cov-2 infection and should not be used as the sole basis for treatment or other patient management decisions. A negative result may  occur with  improper specimen collection/handling, submission of specimen other  than nasopharyngeal swab, presence of viral mutation(s) within the areas targeted by this assay, and inadequate number of viral copies (<131 copies/mL). A negative result must be combined with clinical observations, patient history, and epidemiological information. The expected result is Negative. Fact Sheet for Patients:  PinkCheek.be Fact Sheet for Healthcare Providers:  GravelBags.it This test is not yet ap proved or cleared by the [place] FDA and  has been authorized for detection and/or diagnosis of SARS-CoV-2 by FDA under an Emergency Use Authorization (EUA). This EUA will remain  in effect (meaning this test can be used) for the duration of the COVID-19 declaration under Section 564(b)(1) of the Act, 21 U.S.C. section 360bbb-3(b)(1), unless the authorization is terminated or revoked sooner.    Influenza A by PCR NEGATIVE NEGATIVE Final   Influenza B by PCR NEGATIVE NEGATIVE Final    Comment: (NOTE) The Xpert Xpress SARS-CoV-2/FLU/RSV assay is intended as an aid in  the diagnosis of influenza from Nasopharyngeal swab specimens and  should not be used as a sole basis for treatment. Nasal washings and  aspirates are unacceptable for Xpert Xpress SARS-CoV-2/FLU/RSV  testing. Fact Sheet for Patients: PinkCheek.be Fact Sheet for Healthcare Providers: GravelBags.it This test is not yet approved or cleared by the [place] FDA and  has been authorized for detection and/or diagnosis of SARS-CoV-2 by  FDA under an Emergency Use Authorization (EUA). This EUA will remain  in effect (meaning this test can be used) for the duration of the  Covid-19 declaration under Section 564(b)(1) of the Act, 21  U.S.C. section 360bbb-3(b)(1), unless the authorization is  terminated or  revoked. Performed at [facility], [place] [address], [place], [place] [address]      Radiological Exams on Admission: DG Chest Port 1 View  Result Date: 11/19/2019 CLINICAL DATA:  Shortness of breath EXAM: PORTABLE CHEST 1 VIEW COMPARISON:  11/06/2019 FINDINGS: Small right pleural effusion without significant change. Small to moderate left pleural effusion, increased compared to prior. Worsened airspace disease at the left base. Diffuse increased interstitial opacity. Patchy more confluent airspace disease in the right upper lobe. Stable cardiomediastinal silhouette. IMPRESSION: 1. Small moderate left pleural effusion, increased compared to prior with worsened airspace disease at the left base. Stable small right pleural effusion 2. Diffuse increased interstitial opacity since prior, likely worsened edema. More confluent airspace disease at the left base and right upper lobe, cannot exclude superimposed pneumonia. Electronically Signed   By: Donavan Foil M.D.   On: 11/19/2019 23:16    EKG: Independently reviewed.  Sinus tachycardia.  Assessment/Plan Principal Problem:   Acute pulmonary edema (HCC) Active Problems:   COPD (chronic obstructive pulmonary disease) (HCC)   Acute exacerbation of CHF (congestive heart failure) ([place])   DNR (do not resuscitate)   Hypertensive urgency    1. Acute respiratory failure with hypoxia likely from acute on chronic diastolic CHF with pulmonary edema for which patient was started on Lasix 40 mg IV every 12 with nitroglycerin infusion.  Continue to monitor closely.  Presently on BiPAP.  Cycle cardiac markers follow intake output Daily weights recent 2D echo done on November 04, 2019 was showing EF of 60 to 65%.  Since there is some concern for possible pneumonia on x-ray I have placed patient on antibiotics will check procalcitonin and if negative may discontinue. 2. Elevated troponin likely could be from stress.  Denies any chest pain.  Will trend cardiac  markers patient is presently on aspirin statins and beta-blockers. 3. Hypertensive  urgency on nitroglycerin infusion.  Patient is on amlodipine and metoprolol once patient takes oral medication may try to wean off nitroglycerin. 4. COPD I do not see any active wheezing on my exam.  We will keep patient on nebulizer and Pulmicort.  Did receive 1 dose of IV steroids by EMS. 5. Acute on chronic kidney disease stage III with worsening creatinine.  Note that patient is Lasix now closely monitor. 6. Anemia appears to be chronic follow CBC closely. 7. History of gout on allopurinol. 8. History of depression on Cymbalta. 9. History of chronic pain.  Given that patient has acute respiratory failure with worsening creatinine and renal failure will need close monitoring for any further worsening in inpatient status.   DVT prophylaxis: Lovenox. Code Status: DNR. Family Communication: Discussed with patient. Disposition Plan: Home. Consults called: None. Admission status: Inpatient.   [name] MD [facility] Pager [phone].  If 7PM-7AM, please contact night-coverage [email or web address] Password [facility]  11/20/2019, 5:47 AM

## 2019-11-20 NOTE — Progress Notes (Signed)
PROGRESS NOTE    [name]  YHC:[id number] DOB: Aug 08, 1946 DOA: 11/19/2019 PCP: [name], MD    Brief Narrative:  74 year old female who presented with dyspnea.  She does have significant past medical history for COPD, diastolic heart failure, hypertension, chronic kidney disease stage III.  Patient had recent hospitalization for decompensated stated heart failure.  At home she did well for a few days then had recurrent symptoms of worsening lower extremity edema and dyspnea.  On her initial physical examination her systolic blood pressure was 200, heart rate 88, respiratory rate 20, oxygen saturation 97%, her lungs were clear to auscultation bilaterally, heart S1-S2 present rhythmic, soft abdomen, positive bilateral extremity edema.  Sodium 142, potassium 5.0, chloride 109, bicarb 17, glucose 87, BUN 30, creatinine 2.0, BNP 3431, white count 17.7, hemoglobin 10.0, hematocrit 33.7, platelets 458.  SARS COVID-19 negative.  Chest radiograph with hyperinflation, increased interstitial markings bilaterally, congested hilar regions.  EKG had a 9 bpm, normal axis, normal intervals, sinus rhythm, no ST segment or T wave changes, poor R wave progression.  Patient was admitted to the hospital with working diagnosis of acute on chronic exacerbation of diastolic heart failure, with acute hypoxic respiratory failure.    Assessment & Plan:   Principal Problem:   Acute pulmonary edema (HCC) Active Problems:   COPD (chronic obstructive pulmonary disease) (HCC)   Acute exacerbation of CHF (congestive heart failure) ([place])   DNR (do not resuscitate)   Hypertensive urgency    1. Acute exacerbation of chronic diastolic heart failure. Patient continue to be hypervolemic, positive JVD and lower extremity edema, no S3 gallop. Systolic blood pressure 5176 to 134 mmHg. Urine output over last 24 H is 1,400 cc. Will continue diuresis with IV furosemide, to target negative fluid balance. Continue blood  pressure control with amlodipine, and continue b blocker with metoprolol. Troponin elevation due to heart failure decompensation, no acute coronary syndrome.   2. Uncontrolled HTN. Will continue to wean off nitroglycerin drip, target blood pressure less than 160 mmHg, while acutely ill.   3. COPD. No signs of acute exacerbation, continue bronchodilator therapy and inhaled steroids. Discontinue antibiotic therapy.   4. AKI on CKD stage 3 with anion gap metabolic acidosis. Stable renal function with serum cr at 2,0 with K at 4,7 and serum bicarbonate at 16. Continue aggressive diuresis with loop diuretics to target negative fluid balance. Follow on renal panel in am.   5. Chronic anemia. Multifactorial, continue follow up as outpatient.   6. Chronic pain syndrome. Levorphanol non on formulary, will continue with as needed analgesics.  Continue duloxetine.   7,. Dyslipidemia. Continue with statin therapy.   Patient continue to be at high risk for worsening heart failure decompensation    DVT prophylaxis: enoxaparin   Code Status: dnr  Family Communication: no family at the bedside  Disposition Plan/ discharge barriers: Patient continue critically ill.    Subjective: Patient feeling better,but continue to have dyspnea, worse with exertion, associated with lower extremity edema, no chest pain, no nausea or vomiting, at home with  Limited mobility, not compliant with sodium restriction.   Objective: Vitals:   11/20/19 1319 11/20/19 1330 11/20/19 1430 11/20/19 1502  BP: 137/74 126/89 123/63 134/70  Pulse: 87 93 88 90  Resp: 19 19 19 18   Temp:    98.8 F (37.1 C)  TempSrc:    Oral  SpO2: 93% 94% 91% 95%  Weight:      Height:  Intake/Output Summary (Last 24 hours) at 11/20/2019 1523 Last data filed at 11/20/2019 0957 Gross per 24 hour  Intake 290 ml  Output 900 ml  Net -610 ml   Filed Weights   11/19/19 2316  Weight: 50 kg    Examination:   General: Not in pain,  positive dyspnea at rest.  Neurology: Awake and alert, non focal  E ENT: mild pallor, no icterus, oral mucosa moist Cardiovascular: Positive moderte JVD. S1-S2 present, rhythmic, no gallops, rubs, or murmurs. ++ pitting  lower extremity edema. Pulmonary: vesicular breath sounds bilaterally, adequate air movement, no wheezing, rhonchi or rales. Gastrointestinal. Abdomen with, no organomegaly, non tender, no rebound or guarding Skin. No rashes Musculoskeletal: no joint deformities     Data Reviewed: I have personally reviewed following labs and imaging studies  CBC: Recent Labs  Lab 11/19/19 2306 11/20/19 0620  WBC 17.7* 8.2  NEUTROABS 15.1* 7.9*  HGB 10.0* 8.2*  HCT 33.7* 27.7*  MCV 96.3 95.5  PLT 458* 494   Basic Metabolic Panel: Recent Labs  Lab 11/19/19 2306 11/20/19 0620  NA 142 138  K 5.0 4.7  CL 109 106  CO2 17* 16*  GLUCOSE 87 136*  BUN 30* 30*  CREATININE 2.00* 2.05*  CALCIUM 8.5* 8.1*   GFR: Estimated Creatinine Clearance: 19 mL/min (A) (by C-G formula based on SCr of 2.05 mg/dL (H)). Liver Function Tests: Recent Labs  Lab 11/19/19 2306 11/20/19 0620  AST 105* 59*  ALT 75* 55*  ALKPHOS 383* 301*  BILITOT 0.8 1.2  PROT 6.5 5.6*  ALBUMIN 3.2* 2.8*   No results for input(s): LIPASE, AMYLASE in the last 168 hours. No results for input(s): AMMONIA in the last 168 hours. Coagulation Profile: No results for input(s): INR, PROTIME in the last 168 hours. Cardiac Enzymes: No results for input(s): CKTOTAL, CKMB, CKMBINDEX, TROPONINI in the last 168 hours. BNP (last 3 results) No results for input(s): PROBNP in the last 8760 hours. HbA1C: No results for input(s): HGBA1C in the last 72 hours. CBG: No results for input(s): GLUCAP in the last 168 hours. Lipid Profile: No results for input(s): CHOL, HDL, LDLCALC, TRIG, CHOLHDL, LDLDIRECT in the last 72 hours. Thyroid Function Tests: Recent Labs    11/20/19 0620  TSH 0.392   Anemia Panel: No results  for input(s): VITAMINB12, FOLATE, FERRITIN, TIBC, IRON, RETICCTPCT in the last 72 hours.    Radiology Studies: I have reviewed all of the imaging during this hospital visit personally     Scheduled Meds: . allopurinol  300 mg Oral Daily  . amLODipine  10 mg Oral Daily  . aspirin EC  81 mg Oral Daily  . atorvastatin  20 mg Oral QHS  . budesonide (PULMICORT) nebulizer solution  0.25 mg Nebulization BID  . DULoxetine  60 mg Oral BID  . enoxaparin (LOVENOX) injection  30 mg Subcutaneous Q24H  . furosemide  40 mg Intravenous Q12H  . ipratropium-albuterol  3 mL Nebulization Q6H  . levorphanol  2 mg Oral BID  . metoprolol succinate  50 mg Oral Daily  . multivitamin with minerals  1 tablet Oral Daily  . pantoprazole  40 mg Oral BID   Continuous Infusions: . azithromycin Stopped (11/20/19 1027)  . cefTRIAXone (ROCEPHIN)  IV Stopped (11/20/19 0735)  . nitroGLYCERIN 50 mcg/min (11/20/19 1039)     LOS: 0 days        Breyon Sigg Gerome Apley, MD

## 2019-11-20 NOTE — Progress Notes (Signed)
Pt needs home medication Levorphanol which is not available in the hospital. Called to ask pt's family bring medication to the hospital for her and pt's family said they can't because they live far away from patient.

## 2019-11-21 LAB — BASIC METABOLIC PANEL
Anion gap: 12 (ref 5–15)
BUN: 37 mg/dL — ABNORMAL HIGH (ref 8–23)
CO2: 25 mmol/L (ref 22–32)
Calcium: 8.3 mg/dL — ABNORMAL LOW (ref 8.9–10.3)
Chloride: 103 mmol/L (ref 98–111)
Creatinine, Ser: 2.01 mg/dL — ABNORMAL HIGH (ref 0.44–1.00)
GFR calc Af Amer: 28 mL/min — ABNORMAL LOW (ref >60)
GFR calc non Af Amer: 24 mL/min — ABNORMAL LOW (ref >60)
Glucose, Bld: 116 mg/dL — ABNORMAL HIGH (ref 70–99)
Potassium: 3.7 mmol/L (ref 3.5–5.1)
Sodium: 140 mmol/L (ref 135–145)

## 2019-11-21 LAB — LEGIONELLA PNEUMOPHILA SEROGP 1 UR AG: L. pneumophila Serogp 1 Ur Ag: NEGATIVE

## 2019-11-21 MED ORDER — GUAIFENESIN-DM 100-10 MG/5ML PO SYRP
5.0000 mL | ORAL_SOLUTION | ORAL | Status: DC | PRN
  Administered 2019-11-21: 5 mL via ORAL
  Filled 2019-11-21: qty 5

## 2019-11-21 MED ORDER — NITROGLYCERIN 0.4 MG SL SUBL
SUBLINGUAL_TABLET | SUBLINGUAL | Status: AC
  Filled 2019-11-21: qty 1

## 2019-11-21 NOTE — Plan of Care (Signed)

## 2019-11-21 NOTE — Progress Notes (Signed)
PROGRESS NOTE    GITTY OSTERLUND  POE:[id number] DOB: Nov 30, 1945 DOA: 11/19/2019 PCP: [name], MD    Brief Narrative:  74 year old female who presented with dyspnea.  She does have significant past medical history for COPD, diastolic heart failure, hypertension, chronic kidney disease stage III.  Patient had recent hospitalization for decompensated stated heart failure.  At home she did well for a few days then had recurrent symptoms of worsening lower extremity edema and dyspnea.  On her initial physical examination her systolic blood pressure was 200, heart rate 88, respiratory rate 20, oxygen saturation 97%, her lungs were clear to auscultation bilaterally, heart S1-S2 present rhythmic, soft abdomen, positive bilateral extremity edema.  Sodium 142, potassium 5.0, chloride 109, bicarb 17, glucose 87, BUN 30, creatinine 2.0, BNP 3431, white count 17.7, hemoglobin 10.0, hematocrit 33.7, platelets 458.  SARS COVID-19 negative.  Chest radiograph with hyperinflation, increased interstitial markings bilaterally, congested hilar regions.  EKG had a 9 bpm, normal axis, normal intervals, sinus rhythm, no ST segment or T wave changes, poor R wave progression.  Patient was admitted to the hospital with working diagnosis of acute on chronic exacerbation of diastolic heart failure, with acute hypoxic respiratory failure.    Assessment & Plan:   Principal Problem:   Acute pulmonary edema (HCC) Active Problems:   COPD (chronic obstructive pulmonary disease) (HCC)   Acute exacerbation of CHF (congestive heart failure) ([place])   DNR (do not resuscitate)   Hypertensive urgency    1. Acute exacerbation of chronic diastolic heart failure.  Troponin elevation due to heart failure decompensation, no acute coronary syndrome- monitor clinically.  Continue diuretics monitor electrolytes and renal function.  Continue beta-blocker   2. Uncontrolled HTN.   Wean off NTG drip as tolerated   3. COPD.   Stable, continue bronchodilator therapy and inhaled steroids. Discontinue antibiotic therapy.   4. AKI on CKD stage 3 with anion gap metabolic acidosis.  Acidosis resolved.  Monitor renal function electrolytes   5. Chronic anemia. Multifactorial, continue follow up as outpatient.   6. Chronic pain syndrome. Levorphanol non on formulary, will continue with as needed analgesics.  Continue duloxetine.   7,. Dyslipidemia. Continue with statin therapy.     DVT prophylaxis: enoxaparin   Code Status: dnr  Family Communication: no family at the bedside  Disposition Plan/ discharge barriers: Patient continue critically ill.    Subjective:  no chest pain, shortness of breath better  Objective: Vitals:   11/21/19 0037 11/21/19 0351 11/21/19 0739 11/21/19 1248  BP:  (!) 154/82 (!) 138/98 137/78  Pulse:  81 79 72  Resp:  17 18 18   Temp:  99.1 F (37.3 C) 97.6 F (36.4 C) 97.6 F (36.4 C)  TempSrc:  Oral  Oral  SpO2:  94% 94% 100%  Weight: 48.8 kg     Height:        Intake/Output Summary (Last 24 hours) at 11/21/2019 1434 Last data filed at 11/21/2019 1300 Gross per 24 hour  Intake 1430 ml  Output 3400 ml  Net -1970 ml   Filed Weights   11/19/19 2316 11/20/19 1502 11/21/19 0037  Weight: 50 kg 48.3 kg 48.8 kg    Examination:   General:  Comfortable, no acute distress Neurology:  Alert not oriented, no acute focal deficit E ENT: mild pallor, no icterus, oral mucosa moist Cardiovascular:  No JVD, regular rate and rhythm, no gallops, rubs, or murmurs. ++ pitting  lower extremity edema. Pulmonary:  Mildly diminished breath sounds at  the bases Gastrointestinal. Abdomen with, no organomegaly, non tender, no rebound or guarding Skin. No rashes Musculoskeletal: no joint deformities     Data Reviewed: I have personally reviewed following labs and imaging studies  CBC: Recent Labs  Lab 11/19/19 2306 11/20/19 0620  WBC 17.7* 8.2  NEUTROABS 15.1* 7.9*  HGB 10.0* 8.2*  HCT  33.7* 27.7*  MCV 96.3 95.5  PLT 458* 258   Basic Metabolic Panel: Recent Labs  Lab 11/19/19 2306 11/20/19 0620 11/21/19 0556  NA 142 138 140  K 5.0 4.7 3.7  CL 109 106 103  CO2 17* 16* 25  GLUCOSE 87 136* 116*  BUN 30* 30* 37*  CREATININE 2.00* 2.05* 2.01*  CALCIUM 8.5* 8.1* 8.3*   GFR: Estimated Creatinine Clearance: 18.9 mL/min (A) (by C-G formula based on SCr of 2.01 mg/dL (H)). Liver Function Tests: Recent Labs  Lab 11/19/19 2306 11/20/19 0620  AST 105* 59*  ALT 75* 55*  ALKPHOS 383* 301*  BILITOT 0.8 1.2  PROT 6.5 5.6*  ALBUMIN 3.2* 2.8*   No results for input(s): LIPASE, AMYLASE in the last 168 hours. No results for input(s): AMMONIA in the last 168 hours. Coagulation Profile: No results for input(s): INR, PROTIME in the last 168 hours. Cardiac Enzymes: No results for input(s): CKTOTAL, CKMB, CKMBINDEX, TROPONINI in the last 168 hours. BNP (last 3 results) No results for input(s): PROBNP in the last 8760 hours. HbA1C: No results for input(s): HGBA1C in the last 72 hours. CBG: No results for input(s): GLUCAP in the last 168 hours. Lipid Profile: No results for input(s): CHOL, HDL, LDLCALC, TRIG, CHOLHDL, LDLDIRECT in the last 72 hours. Thyroid Function Tests: Recent Labs    11/20/19 0620  TSH 0.392   Anemia Panel: No results for input(s): VITAMINB12, FOLATE, FERRITIN, TIBC, IRON, RETICCTPCT in the last 72 hours.    Radiology Studies: I have reviewed all of the imaging during this hospital visit personally     Scheduled Meds: . allopurinol  300 mg Oral Daily  . amLODipine  10 mg Oral Daily  . aspirin EC  81 mg Oral Daily  . atorvastatin  20 mg Oral QHS  . budesonide (PULMICORT) nebulizer solution  0.25 mg Nebulization BID  . DULoxetine  60 mg Oral BID  . furosemide  40 mg Intravenous Q12H  . metoprolol succinate  50 mg Oral Daily  . multivitamin with minerals  1 tablet Oral Daily  . nitroGLYCERIN      . pantoprazole  40 mg Oral BID    Continuous Infusions:    LOS: 1 day        Benito Mccreedy, MD

## 2019-11-22 NOTE — Plan of Care (Signed)

## 2019-11-22 NOTE — Progress Notes (Signed)
PROGRESS NOTE    [name]  CNO:[id number] DOB: 06/20/1946 DOA: 11/19/2019 PCP: [name], MD    Brief Narrative:  74 year old female who presented with dyspnea.  She does have significant past medical history for COPD, diastolic heart failure, hypertension, chronic kidney disease stage III.  Patient had recent hospitalization for decompensated stated heart failure.  At home she did well for a few days then had recurrent symptoms of worsening lower extremity edema and dyspnea.  On her initial physical examination her systolic blood pressure was 200, heart rate 88, respiratory rate 20, oxygen saturation 97%, her lungs were clear to auscultation bilaterally, heart S1-S2 present rhythmic, soft abdomen, positive bilateral extremity edema.  Sodium 142, potassium 5.0, chloride 109, bicarb 17, glucose 87, BUN 30, creatinine 2.0, BNP 3431, white count 17.7, hemoglobin 10.0, hematocrit 33.7, platelets 458.  SARS COVID-19 negative.  Chest radiograph with hyperinflation, increased interstitial markings bilaterally, congested hilar regions.  EKG had a 9 bpm, normal axis, normal intervals, sinus rhythm, no ST segment or T wave changes, poor R wave progression.  Patient was admitted to the hospital with working diagnosis of acute on chronic exacerbation of diastolic heart failure, with acute hypoxic respiratory failure.    Assessment & Plan:   Principal Problem:   Acute pulmonary edema (HCC) Active Problems:   COPD (chronic obstructive pulmonary disease) (HCC)   Acute exacerbation of CHF (congestive heart failure) ([place])   DNR (do not resuscitate)   Hypertensive urgency    1. Acute exacerbation of chronic diastolic heart failure.  2D echo 11/04/2019-EF 60-65% with moderate LVH, no regional wall motion abnormality Troponin elevation due to heart failure decompensation, no acute coronary syndrome- monitor clinically.  Continue diuretics monitor electrolytes and renal function.  Continue  beta-blocker   2. Uncontrolled HTN.   Wean off NTG drip as tolerated   3. COPD.  Stable, continue bronchodilator therapy and inhaled steroids. Discontinue antibiotic therapy.   4. AKI on CKD stage 3 with anion gap metabolic acidosis.  Acidosis resolved.  Monitor renal function electrolytes   5. Chronic anemia. Multifactorial, continue follow up as outpatient.   6. Chronic pain syndrome. Levorphanol non on formulary, will continue with as needed analgesics.  Continue duloxetine.   7,. Dyslipidemia. Continue with statin therapy.     DVT prophylaxis: enoxaparin   Code Status: dnr  Family Communication: no family at the bedside  Disposition Plan/ discharge barriers: Patient continue critically ill.    Subjective:  no acute events reported overnight.  No chest pain  Objective: Vitals:   11/22/19 0603 11/22/19 0700 11/22/19 0758 11/22/19 0800  BP: (!) 151/73     Pulse: 69     Resp: 18 19  16   Temp: 98.3 F (36.8 C)     TempSrc: Oral     SpO2: 98%  95%   Weight: 45 kg     Height:        Intake/Output Summary (Last 24 hours) at 11/22/2019 0959 Last data filed at 11/22/2019 0908 Gross per 24 hour  Intake 1066 ml  Output 2500 ml  Net -1434 ml   Filed Weights   11/20/19 1502 11/21/19 0037 11/22/19 0603  Weight: 48.3 kg 48.8 kg 45 kg    Examination:   General:  Comfortable, no acute distress Neurology:  Alert not oriented, no acute focal deficit E ENT: mild pallor, no icterus, oral mucosa moist Cardiovascular:  No JVD, regular rate and rhythm, no gallops, rubs, or murmurs. ++ pitting  lower extremity edema.  Pulmonary:  Mildly diminished breath sounds at the bases Gastrointestinal. Abdomen with, no organomegaly, non tender, no rebound or guarding Skin. No rashes Musculoskeletal: no joint deformities     Data Reviewed: I have personally reviewed following labs and imaging studies  CBC: Recent Labs  Lab 11/19/19 2306 11/20/19 0620  WBC 17.7* 8.2  NEUTROABS  15.1* 7.9*  HGB 10.0* 8.2*  HCT 33.7* 27.7*  MCV 96.3 95.5  PLT 458* 030   Basic Metabolic Panel: Recent Labs  Lab 11/19/19 2306 11/20/19 0620 11/21/19 0556  NA 142 138 140  K 5.0 4.7 3.7  CL 109 106 103  CO2 17* 16* 25  GLUCOSE 87 136* 116*  BUN 30* 30* 37*  CREATININE 2.00* 2.05* 2.01*  CALCIUM 8.5* 8.1* 8.3*   GFR: Estimated Creatinine Clearance: 17.4 mL/min (A) (by C-G formula based on SCr of 2.01 mg/dL (H)). Liver Function Tests: Recent Labs  Lab 11/19/19 2306 11/20/19 0620  AST 105* 59*  ALT 75* 55*  ALKPHOS 383* 301*  BILITOT 0.8 1.2  PROT 6.5 5.6*  ALBUMIN 3.2* 2.8*   No results for input(s): LIPASE, AMYLASE in the last 168 hours. No results for input(s): AMMONIA in the last 168 hours. Coagulation Profile: No results for input(s): INR, PROTIME in the last 168 hours. Cardiac Enzymes: No results for input(s): CKTOTAL, CKMB, CKMBINDEX, TROPONINI in the last 168 hours. BNP (last 3 results) No results for input(s): PROBNP in the last 8760 hours. HbA1C: No results for input(s): HGBA1C in the last 72 hours. CBG: No results for input(s): GLUCAP in the last 168 hours. Lipid Profile: No results for input(s): CHOL, HDL, LDLCALC, TRIG, CHOLHDL, LDLDIRECT in the last 72 hours. Thyroid Function Tests: Recent Labs    11/20/19 0620  TSH 0.392   Anemia Panel: No results for input(s): VITAMINB12, FOLATE, FERRITIN, TIBC, IRON, RETICCTPCT in the last 72 hours.    Radiology Studies: I have reviewed all of the imaging during this hospital visit personally     Scheduled Meds: . allopurinol  300 mg Oral Daily  . amLODipine  10 mg Oral Daily  . aspirin EC  81 mg Oral Daily  . atorvastatin  20 mg Oral QHS  . budesonide (PULMICORT) nebulizer solution  0.25 mg Nebulization BID  . DULoxetine  60 mg Oral BID  . furosemide  40 mg Intravenous Q12H  . metoprolol succinate  50 mg Oral Daily  . multivitamin with minerals  1 tablet Oral Daily  . pantoprazole  40 mg  Oral BID   Continuous Infusions:    LOS: 2 days        Benito Mccreedy, MD

## 2019-11-23 LAB — COMPREHENSIVE METABOLIC PANEL
ALT: 25 U/L (ref 0–44)
AST: 19 U/L (ref 15–41)
Albumin: 2.8 g/dL — ABNORMAL LOW (ref 3.5–5.0)
Alkaline Phosphatase: 209 U/L — ABNORMAL HIGH (ref 38–126)
Anion gap: 14 (ref 5–15)
BUN: 33 mg/dL — ABNORMAL HIGH (ref 8–23)
CO2: 29 mmol/L (ref 22–32)
Calcium: 8.4 mg/dL — ABNORMAL LOW (ref 8.9–10.3)
Chloride: 98 mmol/L (ref 98–111)
Creatinine, Ser: 1.69 mg/dL — ABNORMAL HIGH (ref 0.44–1.00)
GFR calc Af Amer: 34 mL/min — ABNORMAL LOW (ref >60)
GFR calc non Af Amer: 29 mL/min — ABNORMAL LOW (ref >60)
Glucose, Bld: 105 mg/dL — ABNORMAL HIGH (ref 70–99)
Potassium: 3.4 mmol/L — ABNORMAL LOW (ref 3.5–5.1)
Sodium: 141 mmol/L (ref 135–145)
Total Bilirubin: 0.4 mg/dL (ref 0.3–1.2)
Total Protein: 6 g/dL — ABNORMAL LOW (ref 6.5–8.1)

## 2019-11-23 LAB — CBC
HCT: 34.3 % — ABNORMAL LOW (ref 36.0–46.0)
Hemoglobin: 10.6 g/dL — ABNORMAL LOW (ref 12.0–15.0)
MCH: 28.2 pg (ref 26.0–34.0)
MCHC: 30.9 g/dL (ref 30.0–36.0)
MCV: 91.2 fL (ref 80.0–100.0)
Platelets: 359 10*3/uL (ref 150–400)
RBC: 3.76 MIL/uL — ABNORMAL LOW (ref 3.87–5.11)
RDW: 16.9 % — ABNORMAL HIGH (ref 11.5–15.5)
WBC: 6.6 10*3/uL (ref 4.0–10.5)
nRBC: 0 % (ref 0.0–0.2)

## 2019-11-23 LAB — BRAIN NATRIURETIC PEPTIDE: B Natriuretic Peptide: 375.5 pg/mL — ABNORMAL HIGH (ref 0.0–100.0)

## 2019-11-23 MED ORDER — POTASSIUM CHLORIDE CRYS ER 20 MEQ PO TBCR
40.0000 meq | EXTENDED_RELEASE_TABLET | Freq: Once | ORAL | Status: AC
  Administered 2019-11-23: 08:00:00 40 meq via ORAL
  Filled 2019-11-23: qty 2

## 2019-11-23 NOTE — Evaluation (Signed)
Occupational Therapy Evaluation Patient Details Name: [name] MRN: [id number] DOB: 1945/11/06 Today's Date: 11/23/2019    History of Present Illness Patient is a 74 y/o female with presents with SOB. Admitted with acute respiratory failure with hypoxia from acute on chronic diastolic CHF with pulmonary edema. BNP 3400. CXR- left pleural effusion. PMH includes HTN, PD, OSA, depression, CAD, CHF, chronic pain, cKD.   Clinical Impression   Pt reports furniture walking in her home and functioning modified independently in ADL, housekeeping and light meal prep. She reports she is able to manage her own medications. Pt presents with impaired memory, decreased activity tolerance, generalized weakness and impaired balance. Pt needs set up to min guard assist for ADL and mobility. Pt report her shower seat is broken and she would like a new one. Will follow acutely, recommending HHOT to address IADL in the home.    Follow Up Recommendations  Home health OT;Supervision/Assistance - 24 hour    Equipment Recommendations  Tub/shower seat    Recommendations for Other Services       Precautions / Restrictions Precautions Precautions: Fall Precaution Comments: reports falling x 1 over a rug Restrictions Weight Bearing Restrictions: No      Mobility Bed Mobility Overal bed mobility: Needs Assistance Bed Mobility: Supine to Sit;Sit to Supine     Supine to sit: Supervision;HOB elevated Sit to supine: Supervision;HOB elevated   General bed mobility comments: HOB up 30 degrees  Transfers Overall transfer level: Needs assistance Equipment used: Rolling walker (2 wheeled) Transfers: Sit to/from Stand Sit to Stand: Supervision         General transfer comment: performed from bed and toilet    Balance Overall balance assessment: Needs assistance Sitting-balance support: No upper extremity supported;Feet supported Sitting balance-Leahy Scale: Good Sitting balance - Comments: no  LOB with donning socks   Standing balance support: During functional activity Standing balance-Leahy Scale: Poor Standing balance comment: patient preference to UE support.                           ADL either performed or assessed with clinical judgement   ADL Overall ADL's : Needs assistance/impaired Eating/Feeding: Independent;Sitting   Grooming: Supervision/safety;Standing   Upper Body Bathing: Set up;Sitting   Lower Body Bathing: Min guard;Sit to/from stand   Upper Body Dressing : Set up;Sitting   Lower Body Dressing: Min guard;Sit to/from stand   Toilet Transfer: Min guard;Ambulation   Toileting- Clothing Manipulation and Hygiene: Min guard;Sit to/from stand       Functional mobility during ADLs: Min guard;Rolling walker General ADL Comments: decreased activity tolerance     Vision Baseline Vision/History: Wears glasses Patient Visual Report: No change from baseline       Perception     Praxis      Pertinent Vitals/Pain Pain Assessment: No/denies pain     Hand Dominance Right   Extremity/Trunk Assessment Upper Extremity Assessment Upper Extremity Assessment: Overall WFL for tasks assessed   Lower Extremity Assessment Lower Extremity Assessment: Generalized weakness(but functional)   Cervical / Trunk Assessment Cervical / Trunk Assessment: Kyphotic   Communication Communication Communication: HOH   Cognition Arousal/Alertness: Awake/alert Behavior During Therapy: WFL for tasks assessed/performed Overall Cognitive Status: Impaired/Different from baseline Area of Impairment: Memory                 Orientation Level: Disoriented to;Time;Situation Current Attention Level: Sustained Memory: Decreased short-term memory   Safety/Judgement: Decreased awareness of safety Awareness: Emergent  Problem Solving: Difficulty sequencing;Requires verbal cues General Comments: Impaired memory.   General Comments      Exercises      Shoulder Instructions      Home Living Family/patient expects to be discharged to:: Private residence Living Arrangements: Non-relatives/Friends(son of a friend of pts) Available Help at Discharge: Available PRN/intermittently Type of Home: House Home Access: Stairs to enter [employer] of Steps: 2 Entrance Stairs-Rails: Right Home Layout: One level     Bathroom Shower/Tub: Teacher, early years/pre: [place]: Cane - single point;Walker - 2 wheels;Shower seat;Grab bars - tub/shower   Additional Comments: shower seat is broken      Prior Functioning/Environment Level of Independence: Needs assistance  Gait / Transfers Assistance Needed: Furniture walker at home; uses RW for community. Does not drive. ADL's / Homemaking Assistance Needed: reports independence in ADL, housekeeping and med management, roommate does cooking   Comments: roommate's mom visits daily        OT Problem List: Decreased strength;Decreased activity tolerance;Impaired balance (sitting and/or standing);Decreased cognition;Decreased knowledge of use of DME or AE;Decreased safety awareness;Decreased knowledge of precautions;Pain      OT Treatment/Interventions: Self-care/ADL training;DME and/or AE instruction;Therapeutic exercise;Therapeutic activities;Cognitive remediation/compensation;Balance training;Patient/family education    OT Goals(Current goals can be found in the care plan section) Acute Rehab OT Goals Patient Stated Goal: go home, get a new shower seat OT Goal Formulation: With patient Time For Goal Achievement: 12/07/19 Potential to Achieve Goals: Good ADL Goals Pt Will Perform Grooming: with modified independence;standing Pt Will Perform Lower Body Bathing: with modified independence;sit to/from stand Pt Will Perform Lower Body Dressing: with modified independence;sit to/from stand Pt Will Transfer to Toilet: with modified independence;ambulating Pt  Will Perform Toileting - Clothing Manipulation and hygiene: with modified independence;sit to/from stand Additional ADL Goal #1: Pt will identify and gather items necessary for ADL around her room modified independently.  OT Frequency: Min 2X/week   Barriers to D/C: Decreased caregiver support          Co-evaluation              AM-PAC OT "6 Clicks" Daily Activity     Outcome Measure Help from another person eating meals?: None Help from another person taking care of personal grooming?: A Little Help from another person toileting, which includes using toliet, bedpan, or urinal?: A Little Help from another person bathing (including washing, rinsing, drying)?: A Little Help from another person to put on and taking off regular upper body clothing?: None Help from another person to put on and taking off regular lower body clothing?: A Little 6 Click Score: 20   End of Session Equipment Utilized During Treatment: Gait belt;Rolling walker  Activity Tolerance: Patient tolerated treatment well Patient left: in bed;with call bell/phone within reach;with bed alarm set  OT Visit Diagnosis: Unsteadiness on feet (R26.81);Muscle weakness (generalized) (M62.81);Pain                Time: 9622-2979 OT Time Calculation (min): 22 min Charges:  OT General Charges $OT Visit: 1 Visit OT Evaluation $OT Eval Moderate Complexity: [place], OTR/L Acute Rehabilitation Services Pager: [phone] Office: [phone]  Malka So 11/23/2019, 3:20 PM

## 2019-11-23 NOTE — Progress Notes (Signed)
PROGRESS NOTE    KHYLIE LARMORE  PFX:[id number] DOB: 06-30-1946 DOA: 11/19/2019 PCP: [name], MD    Brief Narrative:  74 year old female who presented with dyspnea.  She does have significant past medical history for COPD, diastolic heart failure, hypertension, chronic kidney disease stage III.  Patient had recent hospitalization for decompensated stated heart failure.  At home she did well for a few days then had recurrent symptoms of worsening lower extremity edema and dyspnea.  On her initial physical examination her systolic blood pressure was 200, heart rate 88, respiratory rate 20, oxygen saturation 97%, her lungs were clear to auscultation bilaterally, heart S1-S2 present rhythmic, soft abdomen, positive bilateral extremity edema.  Sodium 142, potassium 5.0, chloride 109, bicarb 17, glucose 87, BUN 30, creatinine 2.0, BNP 3431, white count 17.7, hemoglobin 10.0, hematocrit 33.7, platelets 458.  SARS COVID-19 negative.  Chest radiograph with hyperinflation, increased interstitial markings bilaterally, congested hilar regions.  EKG had a 9 bpm, normal axis, normal intervals, sinus rhythm, no ST segment or T wave changes, poor R wave progression.  Patient was admitted to the hospital with working diagnosis of acute on chronic exacerbation of diastolic heart failure, with acute hypoxic respiratory failure.   Patient has been responding well to diuresis, with improvement in her symptoms, follow with physical therapy and occupational therapy recommendations.   Assessment & Plan:   Principal Problem:   Acute pulmonary edema (HCC) Active Problems:   COPD (chronic obstructive pulmonary disease) (HCC)   Acute exacerbation of CHF (congestive heart failure) ([place])   DNR (do not resuscitate)   Hypertensive urgency    1. Acute exacerbation of chronic diastolic heart failure. Improved volume status with urine output over last 24 H is 2000 cc, with a net negative fluid balance since  admission -4,391 ml. Will plan to transition to oral diuretic therapy in am.  Continue blood pressure control with amlodipine and metoprolol. Out of bed to chair tid with meals and pt/ ot evaluation.   2. Uncontrolled HTN. Blood pressure systolic 329 mmHg, will continue amlodipine and metoprolol.   3. COPD. Continue bronchodilator therapy and inhaled steroids. No signs of acute exacerbation.   4. AKI on CKD stage 3, hypokalemia. Renal function with improving serum cr at 1,69 with K at 3,4 and serum bicarbonate at 29. Will order 40 meq kcl and will follow renal panel in am. To resume oral furosemide in am.   5. Chronic anemia. Multifactorial, cell count has been stable with Hgb at 10,6 and Hct at 34,3.   6. Chronic pain syndrome. Continue with duloxetine. Pain is well controlled with oral analgesics (oxycodone).   7,. Dyslipidemia. Continue with atorvastatin.     DVT prophylaxis: enoxaparin   Code Status: dnr  Family Communication: no family at the bedside  Disposition Plan/ discharge barriers: Plan for dc in am, pending physical therapy and occupational therapy recommendations.    Subjective: Patient is feeling better, dyspnea has improved, no chest pain, has not beed out of the bed yet. No nausea or vomiting.   Objective: Vitals:   11/22/19 2142 11/23/19 0015 11/23/19 0345 11/23/19 0348  BP:  118/63 129/68   Pulse:  (!) 58 (!) 59   Resp:  17 17   Temp:  98.2 F (36.8 C) 98.3 F (36.8 C)   TempSrc:  Oral Oral   SpO2: 91% 94% 94%   Weight:    43.7 kg  Height:        Intake/Output Summary (Last 24 hours) at  11/23/2019 4827 Last data filed at 11/23/2019 0600 Gross per 24 hour  Intake 1249 ml  Output 2000 ml  Net -751 ml   Filed Weights   11/21/19 0037 11/22/19 0603 11/23/19 0348  Weight: 48.8 kg 45 kg 43.7 kg    Examination:   General: Not in pain or dyspnea, deconditioned  Neurology: Awake and alert, non focal  E ENT: mild pallor, no icterus, oral mucosa  moist Cardiovascular: No JVD. S1-S2 present, rhythmic, no gallops, rubs, or murmurs. No lower extremity edema. Pulmonary: positive breath sounds bilaterally, adequate air movement, no wheezing, rhonchi or rales. Gastrointestinal. Abdomen with no organomegaly, non tender, no rebound or guarding Skin. No rashes Musculoskeletal: no joint deformities     Data Reviewed: I have personally reviewed following labs and imaging studies  CBC: Recent Labs  Lab 11/19/19 2306 11/20/19 0620 11/23/19 0429  WBC 17.7* 8.2 6.6  NEUTROABS 15.1* 7.9*  --   HGB 10.0* 8.2* 10.6*  HCT 33.7* 27.7* 34.3*  MCV 96.3 95.5 91.2  PLT 458* 336 078   Basic Metabolic Panel: Recent Labs  Lab 11/19/19 2306 11/20/19 0620 11/21/19 0556 11/23/19 0429  NA 142 138 140 141  K 5.0 4.7 3.7 3.4*  CL 109 106 103 98  CO2 17* 16* 25 29  GLUCOSE 87 136* 116* 105*  BUN 30* 30* 37* 33*  CREATININE 2.00* 2.05* 2.01* 1.69*  CALCIUM 8.5* 8.1* 8.3* 8.4*   GFR: Estimated Creatinine Clearance: 20.1 mL/min (A) (by C-G formula based on SCr of 1.69 mg/dL (H)). Liver Function Tests: Recent Labs  Lab 11/19/19 2306 11/20/19 0620 11/23/19 0429  AST 105* 59* 19  ALT 75* 55* 25  ALKPHOS 383* 301* 209*  BILITOT 0.8 1.2 0.4  PROT 6.5 5.6* 6.0*  ALBUMIN 3.2* 2.8* 2.8*   No results for input(s): LIPASE, AMYLASE in the last 168 hours. No results for input(s): AMMONIA in the last 168 hours. Coagulation Profile: No results for input(s): INR, PROTIME in the last 168 hours. Cardiac Enzymes: No results for input(s): CKTOTAL, CKMB, CKMBINDEX, TROPONINI in the last 168 hours. BNP (last 3 results) No results for input(s): PROBNP in the last 8760 hours. HbA1C: No results for input(s): HGBA1C in the last 72 hours. CBG: No results for input(s): GLUCAP in the last 168 hours. Lipid Profile: No results for input(s): CHOL, HDL, LDLCALC, TRIG, CHOLHDL, LDLDIRECT in the last 72 hours. Thyroid Function Tests: No results for input(s):  TSH, T4TOTAL, FREET4, T3FREE, THYROIDAB in the last 72 hours. Anemia Panel: No results for input(s): VITAMINB12, FOLATE, FERRITIN, TIBC, IRON, RETICCTPCT in the last 72 hours.    Radiology Studies: I have reviewed all of the imaging during this hospital visit personally     Scheduled Meds: . allopurinol  300 mg Oral Daily  . amLODipine  10 mg Oral Daily  . aspirin EC  81 mg Oral Daily  . atorvastatin  20 mg Oral QHS  . budesonide (PULMICORT) nebulizer solution  0.25 mg Nebulization BID  . DULoxetine  60 mg Oral BID  . furosemide  40 mg Intravenous Q12H  . metoprolol succinate  50 mg Oral Daily  . multivitamin with minerals  1 tablet Oral Daily  . pantoprazole  40 mg Oral BID   Continuous Infusions:   LOS: 3 days        Candance Bohlman Gerome Apley, MD

## 2019-11-23 NOTE — Plan of Care (Signed)

## 2019-11-23 NOTE — TOC Initial Note (Addendum)
Transition of Care [facility]) - Initial/Assessment Note    Patient Details  Name: [name] MRN: [id number] Date of Birth: 09/14/1946  Transition of Care [facility]) CM/SW Contact:    Zenon Mayo, RN Phone Number: 11/23/2019, 2:22 PM  Clinical Narrative:                 From home alone, patient states her friend may be able to transport her home at dc,but she is not sure.  She states she has no problems with getting her medications.  NCM offered choice for HHPT/HHOT, she states she really does not have a preference.  NCM made referral to [facility] with [facility]. Awaiting call back. Per Mateo Flow they can not take referral, NCM made referral to [place] with [facility], she states they should be able to take.  Soc will begin 24 to 48 hrs post dc.   Expected Discharge Plan: [place] Barriers to Discharge: Continued Medical Work up   Patient Goals and CMS Choice   CMS Medicare.gov Compare Post Acute Care list provided to:: Patient Choice offered to / list presented to : Patient  Expected Discharge Plan and Services Expected Discharge Plan: [place] In-house Referral: NA Discharge Planning Services: CM Consult Post Acute Care Choice: [place] arrangements for the past 2 months: Single Family Home                 DME Arranged: (NA)                    Prior Living Arrangements/Services Living arrangements for the past 2 months: Single Family Home Lives with:: Self Patient language and need for interpreter reviewed:: Yes Do you feel safe going back to the place where you live?: Yes      Need for Family Participation in Patient Care: No (Comment) Care giver support system in place?: No (comment)   Criminal Activity/Legal Involvement Pertinent to Current Situation/Hospitalization: No - Comment as needed  Activities of Daily Living Home Assistive Devices/Equipment: Eyeglasses, Cane (specify quad or straight), Walker (specify type), Grab  bars in shower ADL Screening (condition at time of admission) Patient's cognitive ability adequate to safely complete daily activities?: Yes Is the patient deaf or have difficulty hearing?: Yes Does the patient have difficulty seeing, even when wearing glasses/contacts?: Yes Does the patient have difficulty concentrating, remembering, or making decisions?: No Patient able to express need for assistance with ADLs?: Yes Does the patient have difficulty dressing or bathing?: No Independently performs ADLs?: Yes (appropriate for developmental age) Does the patient have difficulty walking or climbing stairs?: Yes Weakness of Legs: Both Weakness of Arms/Hands: Both  Permission Sought/Granted                  Emotional Assessment   Attitude/Demeanor/Rapport: Engaged Affect (typically observed): Appropriate Orientation: : Oriented to Self, Oriented to Place, Oriented to  Time, Oriented to Situation Alcohol / Substance Use: Not Applicable Psych Involvement: No (comment)  Admission diagnosis:  Acute pulmonary edema ([place]) [J81.0] Acute on chronic congestive heart failure, unspecified heart failure type [facility]) [I50.9] Patient Active Problem List   Diagnosis Date Noted  . Hypertensive urgency 11/20/2019  . Acute pulmonary edema (HCC)   . Positive D dimer   . Acute respiratory failure with hypoxia ([place]) 11/04/2019  . Acute exacerbation of CHF (congestive heart failure) ([place]) 11/04/2019  . DNR (do not resuscitate) 11/04/2019  . Chronic pain syndrome 11/04/2019  . Pulmonary nodule, left  08/02/2018  . Major depressive disorder, single episode, moderate ([place]) 08/02/2018  . Benzodiazepine overdose of undetermined intent 08/01/2018  . Cellulitis 05/14/2018  . Anemia 05/14/2018  . At risk for adverse drug event 10/17/2017  . Salicylate intoxication, undetermined intent, subsequent encounter 10/10/2017  . Acute renal failure with acute renal cortical necrosis superimposed on stage 3a chronic  kidney disease ([place]) 10/10/2017  . Headache 10/10/2017  . Protein-calorie malnutrition, severe 01/26/2016  . Opiate overdose ([place]) 01/25/2016  . Suicide attempt ([place]) 01/25/2016  . Depression   . Dyslipidemia   . Gastroesophageal reflux disease without esophagitis   . COPD (chronic obstructive pulmonary disease) ([place]) 07/29/2015  . Diabetes mellitus with neurological manifestation ([place]) 07/29/2015  . Frequent falls 07/29/2015  . HTN (hypertension) 07/29/2015  . AKI (acute kidney injury) ([place]) 07/29/2015  . SIRS (systemic inflammatory response syndrome) ([place]) 07/29/2015  . Spinal stenosis of lumbar region 04/23/2013  . Tremor due to multiple drugs 04/23/2013  . Tobacco use disorder 04/23/2013  . COPD exacerbation ([place]) 04/23/2013  . Occlusion and stenosis of carotid artery without mention of cerebral infarction 03/12/2012  . Viral bronchitis-possible h. infl vs Norovirus 11/21/2011  . Pleuritic chest pain 09/18/2011   PCP:  [name], MD Pharmacy:   [place], [place] - [address] [place] [address] Phone: [phone] Fax: [phone]  [place], [place] [address] [place] [address] Phone: [phone] Fax: [phone]     Social Determinants of Health ([place]) Interventions    Readmission Risk Interventions Readmission Risk Prevention Plan 11/23/2019  Transportation Screening Complete  PCP or Specialist Appt within 3-5 Days Complete  HRI or [place] Complete  Social Work Consult for [place] Planning/Counseling Complete  Palliative Care Screening Not Applicable  Medication Review Press photographer) Complete  Some recent data might be hidden

## 2019-11-23 NOTE — Evaluation (Addendum)
Physical Therapy Evaluation Patient Details Name: [name] MRN: [id number] DOB: [birth year] Today's Date: 11/23/2019   History of Present Illness  Patient is a 74 y/o female with presents with SOB. Admitted with acute respiratory failure with hypoxia from acute on chronic diastolic CHF with pulmonary edema. BNP 3400. CXR- left pleural effusion. PMH includes HTN, PD, OSA, depression, CAD, CHF, chronic pain, cKD.  Clinical Impression  Patient presents with generalized weakness, impaired cognition, decreased activity tolerance and impaired mobility s/p above. Pt reports living with a friend who is a truck driver and not home consistently. Pt reports being a furniture walker at baseline and uses RW for community ambulation. Today, pt requires Min guard-supervision for safety and use of RW for support. Limited endurance and activity tolerance requiring seated rest break. VSS throughout. Pt with impaired memory, orientation, safety awareness and attention. A&Ox2 only. Will follow acutely to maximize independence and mobility prior to return home.    Follow Up Recommendations Supervision - Intermittent;Home health PT    Equipment Recommendations  None recommended by PT    Recommendations for Other Services       Precautions / Restrictions Precautions Precautions: Fall Restrictions Weight Bearing Restrictions: No      Mobility  Bed Mobility Overal bed mobility: Needs Assistance Bed Mobility: Supine to Sit           General bed mobility comments: EOB upon entry   Transfers Overall transfer level: Needs assistance Equipment used: Rolling walker (2 wheeled) Transfers: Sit to/from Stand Sit to Stand: Supervision         General transfer comment: Supervision for safety. Stood from [employer], from chair x1.  Ambulation/Gait Ambulation/Gait assistance: Min guard Gait Distance (Feet): 60 Feet Assistive device: Rolling walker (2 wheeled) Gait Pattern/deviations: Step-through  pattern;Decreased stride length;Trunk flexed Gait velocity: decreased   General Gait Details: Slow, mildly unsteady gait with rW for support; cues for upright posture. 2/4 DOE. Fatigues.  Stairs            Wheelchair Mobility    Modified Rankin (Stroke Patients Only)       Balance Overall balance assessment: Needs assistance Sitting-balance support: No upper extremity supported;Feet supported Sitting balance-Leahy Scale: Good     Standing balance support: During functional activity Standing balance-Leahy Scale: Poor Standing balance comment: patient preference to UE support.                             Pertinent Vitals/Pain Pain Assessment: (chronic back pain- does not count per pt report)    Home Living Family/patient expects to be discharged to:: Private residence Living Arrangements: Other relatives(friend is a Administrator) Available Help at Discharge: Available PRN/intermittently Type of Home: House Home Access: Stairs to enter Entrance Stairs-Rails: Right Entrance Stairs-Number of Steps: 2 Home Layout: One [place]: [place] - single point;Walker - 2 wheels;Shower seat;Grab bars - tub/shower      Prior Function Level of Independence: Needs assistance   Gait / Transfers Assistance Needed: Furniture walker at home; uses RW for community. Does not drive.  ADL's / Homemaking Assistance Needed: aide assist/friend assist with IADLs, independent ADLs, med mgmt and simple meals  Comments: Pt reports her friend's mom is there almost everyday.     Hand Dominance   Dominant Hand: Right    Extremity/Trunk Assessment   Upper Extremity Assessment Upper Extremity Assessment: Defer to OT evaluation    Lower Extremity Assessment Lower Extremity Assessment: Generalized  weakness(but functional)    Cervical / Trunk Assessment Cervical / Trunk Assessment: Kyphotic  Communication   Communication: HOH  Cognition Arousal/Alertness:  Awake/alert Behavior During Therapy: WFL for tasks assessed/performed Overall Cognitive Status: Impaired/Different from baseline Area of Impairment: Attention;Memory;Awareness;Problem solving;Safety/judgement;Orientation                 Orientation Level: Disoriented to;Time;Situation Current Attention Level: Sustained Memory: Decreased short-term memory   Safety/Judgement: Decreased awareness of safety Awareness: Emergent Problem Solving: Difficulty sequencing;Requires verbal cues General Comments: Impaired memory.      General Comments General comments (skin integrity, edema, etc.): SP02 >97% on RA.    Exercises     Assessment/Plan    PT Assessment Patient needs continued PT services  PT Problem List Decreased mobility;Decreased safety awareness;Decreased strength;Decreased activity tolerance;Cardiopulmonary status limiting activity;Decreased balance;Decreased knowledge of use of DME;Decreased cognition       PT Treatment Interventions DME instruction;Therapeutic activities;Gait training;Therapeutic exercise;Patient/family education;Stair training;Balance training;Functional mobility training;Cognitive remediation    PT Goals (Current goals can be found in the Care Plan section)  Acute Rehab PT Goals Patient Stated Goal: go home PT Goal Formulation: With patient Time For Goal Achievement: 12/07/19 Potential to Achieve Goals: Good    Frequency Min 3X/week   Barriers to discharge Decreased caregiver support home alone some of the time    Co-evaluation               AM-PAC PT "6 Clicks" Mobility  Outcome Measure Help needed turning from your back to your side while in a flat bed without using bedrails?: None Help needed moving from lying on your back to sitting on the side of a flat bed without using bedrails?: A Little Help needed moving to and from a bed to a chair (including a wheelchair)?: A Little Help needed standing up from a chair using your arms  (e.g., wheelchair or bedside chair)?: A Little Help needed to walk in hospital room?: A Little Help needed climbing 3-5 steps with a railing? : A Little 6 Click Score: 19    End of Session Equipment Utilized During Treatment: Gait belt Activity Tolerance: Patient tolerated treatment well Patient left: in bed;with call bell/phone within reach;with bed alarm set Nurse Communication: Mobility status PT Visit Diagnosis: Other abnormalities of gait and mobility (R26.89);Unsteadiness on feet (R26.81)    Time: 5176-1607 PT Time Calculation (min) (ACUTE ONLY): 19 min   Charges:   PT Evaluation $PT Eval Moderate Complexity: 1 Mod          Marisa Severin, PT, DPT Acute Rehabilitation Services Pager [phone] Office [place] 11/23/2019, 2:13 PM

## 2019-11-24 LAB — BASIC METABOLIC PANEL
Anion gap: 10 (ref 5–15)
BUN: 44 mg/dL — ABNORMAL HIGH (ref 8–23)
CO2: 27 mmol/L (ref 22–32)
Calcium: 8.4 mg/dL — ABNORMAL LOW (ref 8.9–10.3)
Chloride: 100 mmol/L (ref 98–111)
Creatinine, Ser: 1.88 mg/dL — ABNORMAL HIGH (ref 0.44–1.00)
GFR calc Af Amer: 30 mL/min — ABNORMAL LOW (ref >60)
GFR calc non Af Amer: 26 mL/min — ABNORMAL LOW (ref >60)
Glucose, Bld: 112 mg/dL — ABNORMAL HIGH (ref 70–99)
Potassium: 3.9 mmol/L (ref 3.5–5.1)
Sodium: 137 mmol/L (ref 135–145)

## 2019-11-24 MED ORDER — FUROSEMIDE 40 MG PO TABS: 40.0000 mg | ORAL_TABLET | Freq: Every day | ORAL | 0 refills | Status: AC

## 2019-11-24 NOTE — Care Management Important Message (Signed)
Important Message  Patient Details  Name: [name] MRN: [id number] Date of Birth: Nov 16, 1945   Medicare Important Message Given:  Yes     Shelda Altes 11/24/2019, 11:24 AM

## 2019-11-24 NOTE — TOC Transition Note (Signed)
Transition of Care [facility]) - CM/SW Discharge Note   Patient Details  Name: ALEEHA BOLINE MRN: [id number] Date of Birth: 02/04/1946  [facility]) CM/SW Contact:  Zenon Mayo, RN Phone Number: 11/24/2019, 12:11 PM   Clinical Narrative:    Patient does not have a preference for [facility] agency. Tiffany with [facility] was able to take referral. Soc will begin 24 to 48 hrs post dc.   Final next level of care: Home w Home Health Services Barriers to Discharge: No Barriers Identified   Patient Goals and CMS Choice   CMS Medicare.gov Compare Post Acute Care list provided to:: Patient Choice offered to / list presented to : Patient  Discharge Placement                       Discharge Plan and Services In-house Referral: NA Discharge Planning Services: CM Consult Post Acute Care Choice: Home Health          DME Arranged: (NA)         HH Arranged: RN, PT, OT, Disease Management [place] Agency: [facility] [employer]) Date [place]: 11/24/19 Time [place]: 1210 Representative spoke with at [place]: [place] ([place]) Interventions     Readmission Risk Interventions Readmission Risk Prevention Plan 11/23/2019  Transportation Screening Complete  PCP or Specialist Appt within 3-5 Days Complete  HRI or [place] Complete  Social Work Consult for [place] Planning/Counseling [place] Not Applicable  Medication Review Press photographer) Complete  Some recent data might be hidden

## 2019-11-24 NOTE — Discharge Summary (Signed)
Physician Discharge Summary  [name]:[id number] DOB: 06-30-1946 DOA: 11/19/2019  PCP: [name], MD  Admit date: 11/19/2019 Discharge date: 11/24/2019  Admitted From: home  Disposition:  Home   Recommendations for Outpatient Follow-up and new medication changes:  1. Follow up with Dr. Ashby Dawes in 7 days.  2. Patient has been placed on furosemide 40 mg daily. 3. Follow up renal function in 7 days.   Home Health: yes   Equipment/Devices: no    Discharge Condition: stable  CODE STATUS: dnr   Diet recommendation:  Heart healthy   Brief/Interim Summary: 74 year old female who presented with dyspnea. She does have significant past medical history for COPD, diastolic heart failure, hypertension, chronic kidney disease stage IIIa. Patient had recent hospitalization for decompensated stated heart failure. At home she did well for a few days then had recurrent symptoms of worsening lower extremity edema and dyspnea. On her initial physical examination her systolic blood pressure was 200,heart rate 88, respiratory rate 20, oxygen saturation 97%,her lungs were clear to auscultation bilaterally, heart S1-S2 present rhythmic, soft abdomen, positive bilateral extremity edema. Sodium 142, potassium 5.0, chloride 109, bicarb 17, glucose 87, BUN 30, creatinine 2.0, BNP 3431, white count 17.7, hemoglobin 10.0, hematocrit 33.7, platelets 458.SARS COVID-19 negative. Chest radiograph with hyperinflation, increased interstitial markings bilaterally, congested hilar regions. EKG had a 109 bpm, normal axis, normal intervals, sinus rhythm, no ST segment or T wave changes, poor R wave progression.  Patient was admitted to the hospital with working diagnosis of acute on chronic exacerbation of diastolic heart failure, with acute hypoxic respiratory failure and uncontrolled HTN/ hypertensive emergency.   Patient responded well to diuresis, with improvement in her symptoms and blood  pressure.  1.  Acute on chronic exacerbation of diastolic heart failure, complicated by acute hypoxic respiratory failure/acute cardiogenic pulmonary edema (LV systolic function 60 to 60% 01/21).  Patient was admitted to the telemetry cardiac unit, she received aggressive diuresis with intravenous furosemide, negative fluid balance was achieved (-4591 ml since admission), with significant improvement of her symptoms.  Patient will continue metoprolol for heart failure management, will add furosemide 40 mg daily to keep negative fluid balance.  Close follow-up as an outpatient.  2.  Uncontrolled hypertension with hypertensive emergency.  Initially patient was placed on a nitroglycerin drip, antihypertensive agents were stopped, amlodipine and metoprolol.  Patient responded well to medical therapy with improvement of her blood pressure.  3.  Acute kidney injury on chronic kidney disease stage IIIa, hypokalemia.  Her kidney function improved with diuresis, potassium was corrected with potassium chloride, her discharge creatinine is 1.88, potassium 3.9 and serum bicarbonate 27.  Patient will need close follow-up of kidney function as an outpatient.  4.  COPD.  No signs of acute exacerbation.  5.  Multifactorial anemia.  Her hemoglobin and hematocrit remained stable.  6.  Dyslipidemia.  Continue atorvastatin.  7.  Chronic pain syndrome.  Continue duloxetine and oxycodone. Resume levorphanol at discharge.  Discharge Diagnoses:  Principal Problem:   Acute pulmonary edema (HCC) Active Problems:   COPD (chronic obstructive pulmonary disease) (HCC)   Acute exacerbation of CHF (congestive heart failure) (HCC)   DNR (do not resuscitate)   Hypertensive urgency    Discharge Instructions   Allergies as of 11/24/2019   No Known Allergies     Medication List    STOP taking these medications   aspirin 81 MG tablet   doxycycline 100 MG tablet Commonly known as: VIBRA-TABS     TAKE  these  medications   albuterol (2.5 MG/3ML) 0.083% nebulizer solution Commonly known as: PROVENTIL Inhale 3 mLs into the lungs 2 (two) times daily.   albuterol 108 (90 Base) MCG/ACT inhaler Commonly known as: VENTOLIN HFA Inhale 2 puffs into the lungs every 6 (six) hours as needed for wheezing or shortness of breath.   allopurinol 300 MG tablet Commonly known as: ZYLOPRIM Take 1 tablet (300 mg total) by mouth daily.   amLODipine 10 MG tablet Commonly known as: NORVASC Take 1 tablet (10 mg total) by mouth daily.   atorvastatin 20 MG tablet Commonly known as: LIPITOR Take 1 tablet (20 mg total) by mouth at bedtime.   diphenhydrAMINE 25 mg capsule Commonly known as: BENADRYL Take 25 mg by mouth every 6 (six) hours as needed for allergies.   DULoxetine 60 MG capsule Commonly known as: CYMBALTA Take 60 mg by mouth 2 (two) times daily. What changed: Another medication with the same name was removed. Continue taking this medication, and follow the directions you see here.   furosemide 40 MG tablet Commonly known as: Lasix Take 1 tablet (40 mg total) by mouth daily.   levorphanol 2 MG tablet Commonly known as: LEVODROMORAN Take 2 mg by mouth 2 (two) times daily.   metoprolol succinate 50 MG 24 hr tablet Commonly known as: TOPROL-XL Take 1 tablet (50 mg total) by mouth daily. Take with or immediately following a meal.   multivitamin capsule Take 1 capsule by mouth daily.   nicotine 14 mg/24hr patch Commonly known as: NICODERM CQ - dosed in mg/24 hours Place 1 patch (14 mg total) onto the skin daily.   oxyCODONE-acetaminophen 7.5-325 MG tablet Commonly known as: PERCOCET Take 1 tablet by mouth 4 (four) times daily as needed for severe pain.   pantoprazole 40 MG tablet Commonly known as: PROTONIX Take 40 mg by mouth 2 (two) times daily.   umeclidinium bromide 62.5 MCG/INH Aepb Commonly known as: INCRUSE ELLIPTA Inhale 1 puff into the lungs daily.      Follow-up  Information    [facility] Follow up.   Why: HHPT/HHOT       [name], MD. Go on 11/30/2019.   Specialty: Internal Medicine Why: @9 :00am Contact information: [address] [place] [address] [phone]          No Known Allergies      Procedures/Studies: DG Chest 2 View  Result Date: 11/06/2019 CLINICAL DATA:  Short of breath EXAM: CHEST - 2 VIEW COMPARISON:  11/04/2019 FINDINGS: Small left greater than right pleural effusions, improved compared to prior. Airspace disease at the left greater than right lung base. Stable cardiomediastinal silhouette with aortic atherosclerosis. No pneumothorax. IMPRESSION: 1. Small left greater than right pleural effusions with interval improvement since 11/04/2019. 2. Airspace disease at the left greater than right lung base, likely atelectasis. 3. Decreased interstitial pulmonary edema. Electronically Signed   By: Donavan Foil M.D.   On: 11/06/2019 17:54   NM Pulmonary Perfusion  Result Date: 11/06/2019 CLINICAL DATA:  Shortness of breath EXAM: NUCLEAR MEDICINE PERFUSION LUNG SCAN TECHNIQUE: Perfusion images were obtained in multiple projections after intravenous injection of radiopharmaceutical. Ventilation scans intentionally deferred if perfusion scan and chest x-ray adequate for interpretation during COVID 19 epidemic. RADIOPHARMACEUTICALS:  4.06 mCi Tc-10m MAA IV COMPARISON:  Chest x-ray 11/06/2019 FINDINGS: Homogeneous perfusion to the lungs without focal perfusion defect. IMPRESSION: Negative.  No perfusion abnormality is seen. Electronically Signed   By: Madie Reno.D.  On: 11/06/2019 17:53   DG Chest Port 1 View  Result Date: 11/19/2019 CLINICAL DATA:  Shortness of breath EXAM: PORTABLE CHEST 1 VIEW COMPARISON:  11/06/2019 FINDINGS: Small right pleural effusion without significant change. Small to moderate left pleural effusion, increased compared to prior. Worsened airspace disease at the left  base. Diffuse increased interstitial opacity. Patchy more confluent airspace disease in the right upper lobe. Stable cardiomediastinal silhouette. IMPRESSION: 1. Small moderate left pleural effusion, increased compared to prior with worsened airspace disease at the left base. Stable small right pleural effusion 2. Diffuse increased interstitial opacity since prior, likely worsened edema. More confluent airspace disease at the left base and right upper lobe, cannot exclude superimposed pneumonia. Electronically Signed   By: Donavan Foil M.D.   On: 11/19/2019 23:16   DG Chest Portable 1 View  Result Date: 11/04/2019 CLINICAL DATA:  Shortness of breath EXAM: PORTABLE CHEST 1 VIEW COMPARISON:  08/01/2018 FINDINGS: Unchanged mild cardiomegaly. Left-greater-than-right bibasilar opacities. Bilateral interstitial opacities, worse than on the prior study. Small left pleural effusion. IMPRESSION: Cardiomegaly, small left pleural effusion and interstitial opacities likely indicating cardiogenic pulmonary edema. Electronically Signed   By: Ulyses Jarred M.D.   On: 11/04/2019 03:19   ECHOCARDIOGRAM COMPLETE  Result Date: 11/04/2019   ECHOCARDIOGRAM REPORT   Patient Name:   ARDYN FORGE Date of Exam: 11/04/2019 Medical Rec #:  [id number]       Height:       64.0 in Accession #:    [id number]      Weight:       103.0 lb Date of Birth:  1946/07/26        BSA:          1.48 m Patient Age:    74 years        BP:           154/81 mmHg Patient Gender: F               HR:           93 bpm. Exam Location:  Inpatient Procedure: 2D Echo, Cardiac Doppler and Color Doppler Indications:    I50.30* Unspecified diastolic (congestive) heart failure  History:        Patient has prior history of Echocardiogram examinations, most                 recent 03/17/2015. COPD, Signs/Symptoms:Chest Pain and Shortness                 of Breath; Risk Factors:Diabetes and Hypertension. Opiate                 overdose and suicide attempt.  Sonographer:     Roseanna Rainbow RDCS Referring Phys: [place]  1. Left ventricular ejection fraction, by visual estimation, is 60 to 65%. The left ventricle has normal function. There is moderately increased left ventricular hypertrophy.  2. Left ventricular diastolic parameters are indeterminate.  3. Global right ventricle has normal systolic function.The right ventricular size is normal.  4. Left atrial size was normal.  5. Right atrial size was normal.  6. The mitral valve is normal in structure. Trivial mitral valve regurgitation.  7. The tricuspid valve is normal in structure.  8. The aortic valve is tricuspid. Aortic valve regurgitation is not visualized. No evidence of aortic valve sclerosis or stenosis.  9. The pulmonic valve was not well visualized. Pulmonic valve regurgitation is not visualized. 10. The inferior vena cava is  dilated in size with >50% respiratory variability, suggesting right atrial pressure of 8 mmHg. 11. The tricuspid regurgitant velocity is 2.53 m/s, and with an assumed right atrial pressure of 8 mmHg, the estimated right ventricular systolic pressure is mildly elevated at 33.6 mmHg. FINDINGS  Left Ventricle: Left ventricular ejection fraction, by visual estimation, is 60 to 65%. The left ventricle has normal function. The left ventricle has no regional wall motion abnormalities. The left ventricular internal cavity size was the left ventricle is normal in size. There is moderately increased left ventricular hypertrophy. Left ventricular diastolic parameters are indeterminate. Right Ventricle: The right ventricular size is normal. No increase in right ventricular wall thickness. Global RV systolic function is has normal systolic function. The tricuspid regurgitant velocity is 2.53 m/s, and with an assumed right atrial pressure  of 8 mmHg, the estimated right ventricular systolic pressure is mildly elevated at 33.6 mmHg. Left Atrium: Left atrial size was normal in size. Right Atrium:  Right atrial size was normal in size Pericardium: There is no evidence of pericardial effusion. Mitral Valve: The mitral valve is normal in structure. Trivial mitral valve regurgitation. MV peak gradient, 13.5 mmHg. Tricuspid Valve: The tricuspid valve is normal in structure. Tricuspid valve regurgitation is trivial. Aortic Valve: The aortic valve is tricuspid. Aortic valve regurgitation is not visualized. The aortic valve is structurally normal, with no evidence of sclerosis or stenosis. Pulmonic Valve: The pulmonic valve was not well visualized. Pulmonic valve regurgitation is not visualized. Pulmonic regurgitation is not visualized. Aorta: The aortic root and ascending aorta are structurally normal, with no evidence of dilitation. Venous: The inferior vena cava is dilated in size with greater than 50% respiratory variability, suggesting right atrial pressure of 8 mmHg. IAS/Shunts: The atrial septum is grossly normal.  LEFT VENTRICLE PLAX 2D LVIDd:         3.50 cm       Diastology LVIDs:         2.40 cm       LV e' lateral:   4.03 cm/s LV PW:         1.50 cm       LV E/e' lateral: 21.6 LV IVS:        1.40 cm       LV e' medial:    5.66 cm/s LVOT diam:     1.90 cm       LV E/e' medial:  15.4 LV SV:         31 ml LV SV Index:   21.28 LVOT Area:     2.84 cm  LV Volumes (MOD) LV area d, A2C:    28.10 cm LV area d, A4C:    22.80 cm LV area s, A2C:    15.50 cm LV area s, A4C:    14.05 cm LV major d, A2C:   7.59 cm LV major d, A4C:   6.53 cm LV major s, A2C:   6.22 cm LV major s, A4C:   5.70 cm LV vol d, MOD A2C: 86.1 ml LV vol d, MOD A4C: 65.3 ml LV vol s, MOD A2C: 30.6 ml LV vol s, MOD A4C: 29.1 ml LV SV MOD A2C:     55.5 ml LV SV MOD A4C:     65.3 ml LV SV MOD BP:      49.6 ml RIGHT VENTRICLE             IVC RV S prime:     13.20 cm/s  IVC  diam: 2.20 cm TAPSE (M-mode): 2.2 cm LEFT ATRIUM             Index       RIGHT ATRIUM          Index LA diam:        3.20 cm 2.17 cm/m  RA Area:     8.94 cm LA Vol (A2C):    26.3 ml 17.83 ml/m RA Volume:   17.70 ml 12.00 ml/m LA Vol (A4C):   38.0 ml 25.76 ml/m LA Biplane Vol: 31.9 ml 21.62 ml/m  AORTIC VALVE LVOT Vmax:   107.00 cm/s LVOT Vmean:  82.500 cm/s LVOT VTI:    0.235 m  AORTA Ao Root diam: 2.80 cm Ao Asc diam:  3.20 cm MITRAL VALVE                         TRICUSPID VALVE MV Area (PHT): 4.15 cm              TR Peak grad:   25.6 mmHg MV Peak grad:  13.5 mmHg             TR Vmax:        253.00 cm/s MV Mean grad:  3.0 mmHg MV Vmax:       1.84 m/s              SHUNTS MV Vmean:      79.1 cm/s             Systemic VTI:  0.24 m MV VTI:        0.36 m                Systemic Diam: 1.90 cm MV PHT:        53.07 msec MV Decel Time: 183 msec MV E velocity: 86.90 cm/s  103 cm/s MV A velocity: 141.00 cm/s 70.3 cm/s MV E/A ratio:  0.62        1.5  Oswaldo Milian MD Electronically signed by Oswaldo Milian MD Signature Date/Time: 11/04/2019/5:59:05 PM    Final       Procedures:   Subjective: Patient is feeling better, no further dyspnea, no chest pain, no nausea or vomiting.   Discharge Exam: Vitals:   11/24/19 0444 11/24/19 0828  BP: 133/66 129/71  Pulse: 62 73  Resp: 15   Temp: 97.7 F (36.5 C)   SpO2: 97% 96%   Vitals:   11/23/19 2048 11/24/19 0444 11/24/19 0446 11/24/19 0828  BP: (!) 126/59 133/66  129/71  Pulse: 66 62  73  Resp: 18 15    Temp: 98.6 F (37 C) 97.7 F (36.5 C)    TempSrc: Oral Oral    SpO2: 93% 97%  96%  Weight:   43.9 kg   Height:        General: Not in pain or dyspnea.  Neurology: Awake and alert, non focal  E ENT: no pallor, no icterus, oral mucosa moist Cardiovascular: No JVD. S1-S2 present, rhythmic, no gallops, rubs, or murmurs. No lower extremity edema. Pulmonary: positive breath sounds bilaterally, adequate air movement, no wheezing, rhonchi or rales. Gastrointestinal. Abdomen with, no organomegaly, non tender, no rebound or guarding Skin. No rashes Musculoskeletal: no joint deformities   The results of  significant diagnostics from this hospitalization (including imaging, microbiology, ancillary and laboratory) are listed below for reference.     Microbiology: Recent Results (from the past 240 hour(s))  Respiratory Panel by RT PCR (Flu  A&B, Covid) - Nasopharyngeal Swab     Status: None   Collection Time: 11/19/19 10:46 PM   Specimen: Nasopharyngeal Swab  Result Value Ref Range Status   SARS Coronavirus 2 by RT PCR NEGATIVE NEGATIVE Final    Comment: (NOTE) SARS-CoV-2 target nucleic acids are NOT DETECTED. The SARS-CoV-2 RNA is generally detectable in upper respiratoy specimens during the acute phase of infection. The lowest concentration of SARS-CoV-2 viral copies this assay can detect is 131 copies/mL. A negative result does not preclude SARS-Cov-2 infection and should not be used as the sole basis for treatment or other patient management decisions. A negative result may occur with  improper specimen collection/handling, submission of specimen other than nasopharyngeal swab, presence of viral mutation(s) within the areas targeted by this assay, and inadequate number of viral copies (<131 copies/mL). A negative result must be combined with clinical observations, patient history, and epidemiological information. The expected result is Negative. Fact Sheet for Patients:  PinkCheek.be Fact Sheet for Healthcare Providers:  GravelBags.it This test is not yet ap proved or cleared by the [place] FDA and  has been authorized for detection and/or diagnosis of SARS-CoV-2 by FDA under an Emergency Use Authorization (EUA). This EUA will remain  in effect (meaning this test can be used) for the duration of the COVID-19 declaration under Section 564(b)(1) of the Act, 21 U.S.C. section 360bbb-3(b)(1), unless the authorization is terminated or revoked sooner.    Influenza A by PCR NEGATIVE NEGATIVE Final   Influenza B by PCR  NEGATIVE NEGATIVE Final    Comment: (NOTE) The Xpert Xpress SARS-CoV-2/FLU/RSV assay is intended as an aid in  the diagnosis of influenza from Nasopharyngeal swab specimens and  should not be used as a sole basis for treatment. Nasal washings and  aspirates are unacceptable for Xpert Xpress SARS-CoV-2/FLU/RSV  testing. Fact Sheet for Patients: PinkCheek.be Fact Sheet for Healthcare Providers: GravelBags.it This test is not yet approved or cleared by the [place] FDA and  has been authorized for detection and/or diagnosis of SARS-CoV-2 by  FDA under an Emergency Use Authorization (EUA). This EUA will remain  in effect (meaning this test can be used) for the duration of the  Covid-19 declaration under Section 564(b)(1) of the Act, 21  U.S.C. section 360bbb-3(b)(1), unless the authorization is  terminated or revoked. Performed at [facility], [place] [address], [place], [place] [address]      Labs: BNP (last 3 results) Recent Labs    11/04/19 0341 11/19/19 2306 11/23/19 0429  BNP 1,120.6* 3,431.8* 660.6*   Basic Metabolic Panel: Recent Labs  Lab 11/19/19 2306 11/20/19 0620 11/21/19 0556 11/23/19 0429 11/24/19 0524  NA 142 138 140 141 137  K 5.0 4.7 3.7 3.4* 3.9  CL 109 106 103 98 100  CO2 17* 16* 25 29 27   GLUCOSE 87 136* 116* 105* 112*  BUN 30* 30* 37* 33* 44*  CREATININE 2.00* 2.05* 2.01* 1.69* 1.88*  CALCIUM 8.5* 8.1* 8.3* 8.4* 8.4*   Liver Function Tests: Recent Labs  Lab 11/19/19 2306 11/20/19 0620 11/23/19 0429  AST 105* 59* 19  ALT 75* 55* 25  ALKPHOS 383* 301* 209*  BILITOT 0.8 1.2 0.4  PROT 6.5 5.6* 6.0*  ALBUMIN 3.2* 2.8* 2.8*   No results for input(s): LIPASE, AMYLASE in the last 168 hours. No results for input(s): AMMONIA in the last 168 hours. CBC: Recent Labs  Lab 11/19/19 2306 11/20/19 0620 11/23/19 0429  WBC 17.7* 8.2 6.6  NEUTROABS 15.1*  7.9*  --   HGB 10.0* 8.2*  10.6*  HCT 33.7* 27.7* 34.3*  MCV 96.3 95.5 91.2  PLT 458* 336 359   Cardiac Enzymes: No results for input(s): CKTOTAL, CKMB, CKMBINDEX, TROPONINI in the last 168 hours. BNP: Invalid input(s): POCBNP CBG: No results for input(s): GLUCAP in the last 168 hours. D-Dimer No results for input(s): DDIMER in the last 72 hours. Hgb A1c No results for input(s): HGBA1C in the last 72 hours. Lipid Profile No results for input(s): CHOL, HDL, LDLCALC, TRIG, CHOLHDL, LDLDIRECT in the last 72 hours. Thyroid function studies No results for input(s): TSH, T4TOTAL, T3FREE, THYROIDAB in the last 72 hours.  Invalid input(s): FREET3 Anemia work up No results for input(s): VITAMINB12, FOLATE, FERRITIN, TIBC, IRON, RETICCTPCT in the last 72 hours. Urinalysis    Component Value Date/Time   COLORURINE STRAW (A) 11/04/2019 0911   APPEARANCEUR CLEAR 11/04/2019 0911   LABSPEC 1.010 11/04/2019 0911   PHURINE 5.0 11/04/2019 0911   GLUCOSEU NEGATIVE 11/04/2019 0911   HGBUR NEGATIVE 11/04/2019 0911   BILIRUBINUR NEGATIVE 11/04/2019 0911   KETONESUR NEGATIVE 11/04/2019 0911   PROTEINUR NEGATIVE 11/04/2019 0911   UROBILINOGEN 0.2 07/28/2015 2223   NITRITE NEGATIVE 11/04/2019 0911   LEUKOCYTESUR MODERATE (A) 11/04/2019 0911   Sepsis Labs Invalid input(s): PROCALCITONIN,  WBC,  LACTICIDVEN Microbiology Recent Results (from the past 240 hour(s))  Respiratory Panel by RT PCR (Flu A&B, Covid) - Nasopharyngeal Swab     Status: None   Collection Time: 11/19/19 10:46 PM   Specimen: Nasopharyngeal Swab  Result Value Ref Range Status   SARS Coronavirus 2 by RT PCR NEGATIVE NEGATIVE Final    Comment: (NOTE) SARS-CoV-2 target nucleic acids are NOT DETECTED. The SARS-CoV-2 RNA is generally detectable in upper respiratoy specimens during the acute phase of infection. The lowest concentration of SARS-CoV-2 viral copies this assay can detect is 131 copies/mL. A negative result does not preclude  SARS-Cov-2 infection and should not be used as the sole basis for treatment or other patient management decisions. A negative result may occur with  improper specimen collection/handling, submission of specimen other than nasopharyngeal swab, presence of viral mutation(s) within the areas targeted by this assay, and inadequate number of viral copies (<131 copies/mL). A negative result must be combined with clinical observations, patient history, and epidemiological information. The expected result is Negative. Fact Sheet for Patients:  PinkCheek.be Fact Sheet for Healthcare Providers:  GravelBags.it This test is not yet ap proved or cleared by the [place] FDA and  has been authorized for detection and/or diagnosis of SARS-CoV-2 by FDA under an Emergency Use Authorization (EUA). This EUA will remain  in effect (meaning this test can be used) for the duration of the COVID-19 declaration under Section 564(b)(1) of the Act, 21 U.S.C. section 360bbb-3(b)(1), unless the authorization is terminated or revoked sooner.    Influenza A by PCR NEGATIVE NEGATIVE Final   Influenza B by PCR NEGATIVE NEGATIVE Final    Comment: (NOTE) The Xpert Xpress SARS-CoV-2/FLU/RSV assay is intended as an aid in  the diagnosis of influenza from Nasopharyngeal swab specimens and  should not be used as a sole basis for treatment. Nasal washings and  aspirates are unacceptable for Xpert Xpress SARS-CoV-2/FLU/RSV  testing. Fact Sheet for Patients: PinkCheek.be Fact Sheet for Healthcare Providers: GravelBags.it This test is not yet approved or cleared by the [place] FDA and  has been authorized for detection and/or diagnosis of SARS-CoV-2 by  FDA under an Emergency Use Authorization (  EUA). This EUA will remain  in effect (meaning this test can be used) for the duration of the  Covid-19  declaration under Section 564(b)(1) of the Act, 21  U.S.C. section 360bbb-3(b)(1), unless the authorization is  terminated or revoked. Performed at [facility], [place] [address], [place], [place] [address]      Time coordinating discharge: 45 minutes  SIGNED:   Tawni Millers, MD  [facility] 11/24/2019, 8:34 AM

## 2019-11-24 NOTE — Progress Notes (Signed)
Physical Therapy Treatment Patient Details Name: [name] MRN: [id number] DOB: 23-Dec-1945 Today's Date: 11/24/2019    History of Present Illness Patient is a 74 y/o female with presents with SOB. Admitted with acute respiratory failure with hypoxia from acute on chronic diastolic CHF with pulmonary edema. BNP 3400. CXR- left pleural effusion. PMH includes HTN, PD, OSA, depression, CAD, CHF, chronic pain, cKD.    PT Comments    Patient progressing well towards PT goals. Improved ambulation distance today with min guard assist for balance/safety. Pt fatigues quickly needing a seated rest break with little warning. Noted to have BLE weakness worsened with increased distance. Continues to have cognitive deficits relating to memory and awareness. VSS on RA. Will follow.   Follow Up Recommendations  Supervision - Intermittent;Home health PT     Equipment Recommendations  None recommended by PT    Recommendations for Other Services       Precautions / Restrictions Precautions Precautions: Fall Precaution Comments: reports falling x 1 over a rug Restrictions Weight Bearing Restrictions: No    Mobility  Bed Mobility Overal bed mobility: Needs Assistance Bed Mobility: Supine to Sit     Supine to sit: Supervision;HOB elevated     General bed mobility comments: Supervision for safety.  Transfers Overall transfer level: Needs assistance Equipment used: Rolling walker (2 wheeled) Transfers: Sit to/from Stand Sit to Stand: Supervision         General transfer comment: Supervision for safety. Stood from [employer], transferred to chair post ambulation.  Ambulation/Gait Ambulation/Gait assistance: Min guard Gait Distance (Feet): 75 Feet(+15') Assistive device: Rolling walker (2 wheeled) Gait Pattern/deviations: Step-through pattern;Decreased stride length;Trunk flexed Gait velocity: decreased   General Gait Details: Slow, mildly unsteady gait with rW for support; cues for  upright posture. 2/4 DOE. Fatigues quickly needing sitting rest break   Stairs             Wheelchair Mobility    Modified Rankin (Stroke Patients Only)       Balance Overall balance assessment: Needs assistance Sitting-balance support: No upper extremity supported;Feet supported Sitting balance-Leahy Scale: Good     Standing balance support: During functional activity Standing balance-Leahy Scale: Poor Standing balance comment: patient preference to UE support.                            Cognition Arousal/Alertness: Awake/alert Behavior During Therapy: WFL for tasks assessed/performed Overall Cognitive Status: Impaired/Different from baseline Area of Impairment: Memory;Orientation;Awareness                 Orientation Level: Disoriented to;Time   Memory: Decreased short-term memory     Awareness: Emergent          Exercises      General Comments General comments (skin integrity, edema, etc.): VSS on RA      Pertinent Vitals/Pain Pain Assessment: No/denies pain    Home Living                      Prior Function            PT Goals (current goals can now be found in the care plan section) Progress towards PT goals: Progressing toward goals    Frequency    Min 3X/week      PT Plan Current plan remains appropriate    Co-evaluation              AM-PAC PT "6 Clicks"  Mobility   Outcome Measure  Help needed turning from your back to your side while in a flat bed without using bedrails?: None Help needed moving from lying on your back to sitting on the side of a flat bed without using bedrails?: None Help needed moving to and from a bed to a chair (including a wheelchair)?: A Little Help needed standing up from a chair using your arms (e.g., wheelchair or bedside chair)?: A Little Help needed to walk in hospital room?: A Little Help needed climbing 3-5 steps with a railing? : A Little 6 Click Score: 20     End of Session Equipment Utilized During Treatment: Gait belt Activity Tolerance: Patient tolerated treatment well Patient left: in chair;with call bell/phone within reach;with chair alarm set Nurse Communication: Mobility status PT Visit Diagnosis: Other abnormalities of gait and mobility (R26.89);Unsteadiness on feet (R26.81)     Time: 0109-3235 PT Time Calculation (min) (ACUTE ONLY): 14 min  Charges:  $Therapeutic Activity: 8-22 mins                     Marisa Severin, PT, DPT Acute Rehabilitation Services Pager [phone] Office [phone]       [name] 11/24/2019, 12:32 PM

## 2019-12-01 ENCOUNTER — Telehealth: Payer: Self-pay | Admitting: Cardiology

## 2019-12-01 NOTE — Telephone Encounter (Signed)
Informed the pt that I do not see where anyone from our office tried to call her, or on his discharge summary from the hospital, where she is to follow-up with Cards.  Did note in her hospital discharge papers, that she was to follow-up with her PCP Dr. Ashby Dawes on 11/30/19 at 0900, which appeared she missed that appt.  Advised the pt that she needs to call them back now, and have that appt rescheduled, as her discharge papers said to follow-up with her PCP 3-5 days after discharge.  Also informed the pt that it appears home health therapy was ordered for her, and she should keep those services with PT, when that starts.  Provided the pt with Dr. Mathis Fare PCP office, to call back and reschedule her post-hospital follow-up appt with them. Pt verbalized understanding and agrees with this plan.  Pt states she didn't realize she had an appt with her PCP yesterday, and will call them now to reschedule. Pt gracious for all the assistance provided.

## 2019-12-01 NOTE — Telephone Encounter (Signed)
Patient states she received a call from our office. I did not see any notes or messages in regards to a call - not sure what it was in regards to.

## 2019-12-21 ENCOUNTER — Other Ambulatory Visit: Payer: Self-pay

## 2019-12-21 ENCOUNTER — Encounter (HOSPITAL_COMMUNITY): Payer: Self-pay | Admitting: Emergency Medicine

## 2019-12-21 ENCOUNTER — Inpatient Hospital Stay (HOSPITAL_COMMUNITY)
Admission: EM | Admit: 2019-12-21 | Discharge: 2019-12-25 | DRG: 291 | Disposition: A | Discharge status: Home Health | Payer: Medicare Other | Attending: Internal Medicine | Admitting: Internal Medicine

## 2019-12-21 ENCOUNTER — Emergency Department (HOSPITAL_COMMUNITY): Payer: Medicare Other

## 2019-12-21 DIAGNOSIS — N183 Chronic kidney disease, stage 3 unspecified: Secondary | ICD-10-CM | POA: Diagnosis present

## 2019-12-21 DIAGNOSIS — I5033 Acute on chronic diastolic (congestive) heart failure: Secondary | ICD-10-CM | POA: Diagnosis present

## 2019-12-21 DIAGNOSIS — I130 Hypertensive heart and chronic kidney disease with heart failure and stage 1 through stage 4 chronic kidney disease, or unspecified chronic kidney disease: Principal | ICD-10-CM | POA: Diagnosis present

## 2019-12-21 DIAGNOSIS — Z681 Body mass index (BMI) 19 or less, adult: Secondary | ICD-10-CM | POA: Diagnosis not present

## 2019-12-21 DIAGNOSIS — I160 Hypertensive urgency: Secondary | ICD-10-CM | POA: Diagnosis present

## 2019-12-21 DIAGNOSIS — R52 Pain, unspecified: Secondary | ICD-10-CM | POA: Diagnosis not present

## 2019-12-21 DIAGNOSIS — R069 Unspecified abnormalities of breathing: Secondary | ICD-10-CM | POA: Diagnosis not present

## 2019-12-21 DIAGNOSIS — R000 Tachycardia, unspecified: Secondary | ICD-10-CM | POA: Diagnosis not present

## 2019-12-21 DIAGNOSIS — I509 Heart failure, unspecified: Secondary | ICD-10-CM | POA: Diagnosis not present

## 2019-12-21 DIAGNOSIS — M1990 Unspecified osteoarthritis, unspecified site: Secondary | ICD-10-CM | POA: Diagnosis present

## 2019-12-21 DIAGNOSIS — F419 Anxiety disorder, unspecified: Secondary | ICD-10-CM | POA: Diagnosis present

## 2019-12-21 DIAGNOSIS — N184 Chronic kidney disease, stage 4 (severe): Secondary | ICD-10-CM | POA: Diagnosis present

## 2019-12-21 DIAGNOSIS — G20 Parkinson's disease: Secondary | ICD-10-CM | POA: Diagnosis present

## 2019-12-21 DIAGNOSIS — Z9114 Patient's other noncompliance with medication regimen: Secondary | ICD-10-CM

## 2019-12-21 DIAGNOSIS — G4730 Sleep apnea, unspecified: Secondary | ICD-10-CM | POA: Diagnosis present

## 2019-12-21 DIAGNOSIS — N1832 Chronic kidney disease, stage 3b: Secondary | ICD-10-CM | POA: Diagnosis not present

## 2019-12-21 DIAGNOSIS — Z8711 Personal history of peptic ulcer disease: Secondary | ICD-10-CM

## 2019-12-21 DIAGNOSIS — K219 Gastro-esophageal reflux disease without esophagitis: Secondary | ICD-10-CM | POA: Diagnosis present

## 2019-12-21 DIAGNOSIS — J810 Acute pulmonary edema: Secondary | ICD-10-CM | POA: Diagnosis present

## 2019-12-21 DIAGNOSIS — Z66 Do not resuscitate: Secondary | ICD-10-CM | POA: Diagnosis not present

## 2019-12-21 DIAGNOSIS — J9602 Acute respiratory failure with hypercapnia: Secondary | ICD-10-CM | POA: Diagnosis present

## 2019-12-21 DIAGNOSIS — Z20822 Contact with and (suspected) exposure to covid-19: Secondary | ICD-10-CM | POA: Diagnosis present

## 2019-12-21 DIAGNOSIS — R0602 Shortness of breath: Secondary | ICD-10-CM | POA: Diagnosis not present

## 2019-12-21 DIAGNOSIS — D649 Anemia, unspecified: Secondary | ICD-10-CM | POA: Diagnosis not present

## 2019-12-21 DIAGNOSIS — Z91128 Patient's intentional underdosing of medication regimen for other reason: Secondary | ICD-10-CM | POA: Diagnosis not present

## 2019-12-21 DIAGNOSIS — E43 Unspecified severe protein-calorie malnutrition: Secondary | ICD-10-CM

## 2019-12-21 DIAGNOSIS — G894 Chronic pain syndrome: Secondary | ICD-10-CM | POA: Diagnosis not present

## 2019-12-21 DIAGNOSIS — T50916A Underdosing of multiple unspecified drugs, medicaments and biological substances, initial encounter: Secondary | ICD-10-CM | POA: Diagnosis present

## 2019-12-21 DIAGNOSIS — J90 Pleural effusion, not elsewhere classified: Secondary | ICD-10-CM | POA: Diagnosis not present

## 2019-12-21 DIAGNOSIS — I2510 Atherosclerotic heart disease of native coronary artery without angina pectoris: Secondary | ICD-10-CM | POA: Diagnosis not present

## 2019-12-21 DIAGNOSIS — R0902 Hypoxemia: Secondary | ICD-10-CM | POA: Diagnosis not present

## 2019-12-21 DIAGNOSIS — Z833 Family history of diabetes mellitus: Secondary | ICD-10-CM

## 2019-12-21 DIAGNOSIS — F17210 Nicotine dependence, cigarettes, uncomplicated: Secondary | ICD-10-CM | POA: Diagnosis present

## 2019-12-21 DIAGNOSIS — E1151 Type 2 diabetes mellitus with diabetic peripheral angiopathy without gangrene: Secondary | ICD-10-CM | POA: Diagnosis not present

## 2019-12-21 DIAGNOSIS — J449 Chronic obstructive pulmonary disease, unspecified: Secondary | ICD-10-CM | POA: Diagnosis present

## 2019-12-21 DIAGNOSIS — J189 Pneumonia, unspecified organism: Secondary | ICD-10-CM | POA: Diagnosis not present

## 2019-12-21 DIAGNOSIS — Z79899 Other long term (current) drug therapy: Secondary | ICD-10-CM | POA: Diagnosis not present

## 2019-12-21 DIAGNOSIS — J9601 Acute respiratory failure with hypoxia: Secondary | ICD-10-CM | POA: Diagnosis not present

## 2019-12-21 DIAGNOSIS — Z8249 Family history of ischemic heart disease and other diseases of the circulatory system: Secondary | ICD-10-CM

## 2019-12-21 DIAGNOSIS — I110 Hypertensive heart disease with heart failure: Secondary | ICD-10-CM | POA: Diagnosis not present

## 2019-12-21 DIAGNOSIS — J441 Chronic obstructive pulmonary disease with (acute) exacerbation: Secondary | ICD-10-CM | POA: Diagnosis not present

## 2019-12-21 DIAGNOSIS — E440 Moderate protein-calorie malnutrition: Secondary | ICD-10-CM | POA: Insufficient documentation

## 2019-12-21 DIAGNOSIS — Y92009 Unspecified place in unspecified non-institutional (private) residence as the place of occurrence of the external cause: Secondary | ICD-10-CM

## 2019-12-21 DIAGNOSIS — R0689 Other abnormalities of breathing: Secondary | ICD-10-CM | POA: Diagnosis not present

## 2019-12-21 DIAGNOSIS — Z743 Need for continuous supervision: Secondary | ICD-10-CM | POA: Diagnosis not present

## 2019-12-21 HISTORY — DX: Unspecified hearing loss, unspecified ear: H91.90

## 2019-12-21 LAB — CBC WITH DIFFERENTIAL/PLATELET
Abs Immature Granulocytes: 0.08 10*3/uL — ABNORMAL HIGH (ref 0.00–0.07)
Basophils Absolute: 0.1 10*3/uL (ref 0.0–0.1)
Basophils Relative: 0 %
Eosinophils Absolute: 0.5 10*3/uL (ref 0.0–0.5)
Eosinophils Relative: 3 %
HCT: 37.6 % (ref 36.0–46.0)
Hemoglobin: 11 g/dL — ABNORMAL LOW (ref 12.0–15.0)
Immature Granulocytes: 0 %
Lymphocytes Relative: 30 %
Lymphs Abs: 5.6 10*3/uL — ABNORMAL HIGH (ref 0.7–4.0)
MCH: 28.1 pg (ref 26.0–34.0)
MCHC: 29.3 g/dL — ABNORMAL LOW (ref 30.0–36.0)
MCV: 96.2 fL (ref 80.0–100.0)
Monocytes Absolute: 0.7 10*3/uL (ref 0.1–1.0)
Monocytes Relative: 4 %
Neutro Abs: 11.7 10*3/uL — ABNORMAL HIGH (ref 1.7–7.7)
Neutrophils Relative %: 63 %
Platelets: 446 10*3/uL — ABNORMAL HIGH (ref 150–400)
RBC: 3.91 MIL/uL (ref 3.87–5.11)
RDW: 17.4 % — ABNORMAL HIGH (ref 11.5–15.5)
WBC: 18.8 10*3/uL — ABNORMAL HIGH (ref 4.0–10.5)
nRBC: 0 % (ref 0.0–0.2)

## 2019-12-21 LAB — POCT I-STAT 7, (LYTES, BLD GAS, ICA,H+H)
Acid-base deficit: 14 mmol/L — ABNORMAL HIGH (ref 0.0–2.0)
Acid-base deficit: 8 mmol/L — ABNORMAL HIGH (ref 0.0–2.0)
Bicarbonate: 14.1 mmol/L — ABNORMAL LOW (ref 20.0–28.0)
Bicarbonate: 15.6 mmol/L — ABNORMAL LOW (ref 20.0–28.0)
Calcium, Ion: 1.17 mmol/L (ref 1.15–1.40)
Calcium, Ion: 1.07 mmol/L — ABNORMAL LOW (ref 1.15–1.40)
HCT: 32 % — ABNORMAL LOW (ref 36.0–46.0)
HCT: 25 % — ABNORMAL LOW (ref 36.0–46.0)
Hemoglobin: 10.9 g/dL — ABNORMAL LOW (ref 12.0–15.0)
Hemoglobin: 8.5 g/dL — ABNORMAL LOW (ref 12.0–15.0)
O2 Saturation: 96 %
O2 Saturation: 100 %
Patient temperature: 97.3
Patient temperature: 97.2
Potassium: 5 mmol/L (ref 3.5–5.1)
Potassium: 4.6 mmol/L (ref 3.5–5.1)
Sodium: 141 mmol/L (ref 135–145)
Sodium: 144 mmol/L (ref 135–145)
TCO2: 15 mmol/L — ABNORMAL LOW (ref 22–32)
TCO2: 16 mmol/L — ABNORMAL LOW (ref 22–32)
pCO2 arterial: 40.2 mmHg (ref 32.0–48.0)
pCO2 arterial: 24.3 mmHg — ABNORMAL LOW (ref 32.0–48.0)
pH, Arterial: 7.15 — CL (ref 7.350–7.450)
pH, Arterial: 7.413 (ref 7.350–7.450)
pO2, Arterial: 101 mmHg (ref 83.0–108.0)
pO2, Arterial: 182 mmHg — ABNORMAL HIGH (ref 83.0–108.0)

## 2019-12-21 LAB — COMPREHENSIVE METABOLIC PANEL
ALT: 12 U/L (ref 0–44)
AST: 34 U/L (ref 15–41)
Albumin: 3.4 g/dL — ABNORMAL LOW (ref 3.5–5.0)
Alkaline Phosphatase: 135 U/L — ABNORMAL HIGH (ref 38–126)
Anion gap: 14 (ref 5–15)
BUN: 28 mg/dL — ABNORMAL HIGH (ref 8–23)
CO2: 13 mmol/L — ABNORMAL LOW (ref 22–32)
Calcium: 8 mg/dL — ABNORMAL LOW (ref 8.9–10.3)
Chloride: 115 mmol/L — ABNORMAL HIGH (ref 98–111)
Creatinine, Ser: 1.64 mg/dL — ABNORMAL HIGH (ref 0.44–1.00)
GFR calc Af Amer: 35 mL/min — ABNORMAL LOW (ref >60)
GFR calc non Af Amer: 30 mL/min — ABNORMAL LOW (ref >60)
Glucose, Bld: 247 mg/dL — ABNORMAL HIGH (ref 70–99)
Potassium: 5.2 mmol/L — ABNORMAL HIGH (ref 3.5–5.1)
Sodium: 142 mmol/L (ref 135–145)
Total Bilirubin: 0.7 mg/dL (ref 0.3–1.2)
Total Protein: 6.5 g/dL (ref 6.5–8.1)

## 2019-12-21 LAB — LACTIC ACID, PLASMA
Lactic Acid, Venous: 2.1 mmol/L (ref 0.5–1.9)
Lactic Acid, Venous: 0.9 mmol/L (ref 0.5–1.9)

## 2019-12-21 LAB — CBC
HCT: 35.3 % — ABNORMAL LOW (ref 36.0–46.0)
Hemoglobin: 10 g/dL — ABNORMAL LOW (ref 12.0–15.0)
MCH: 26.8 pg (ref 26.0–34.0)
MCHC: 28.3 g/dL — ABNORMAL LOW (ref 30.0–36.0)
MCV: 94.6 fL (ref 80.0–100.0)
Platelets: 320 10*3/uL (ref 150–400)
RBC: 3.73 MIL/uL — ABNORMAL LOW (ref 3.87–5.11)
RDW: 17.1 % — ABNORMAL HIGH (ref 11.5–15.5)
WBC: 12.7 10*3/uL — ABNORMAL HIGH (ref 4.0–10.5)
nRBC: 0 % (ref 0.0–0.2)

## 2019-12-21 LAB — BASIC METABOLIC PANEL
Anion gap: 18 — ABNORMAL HIGH (ref 5–15)
BUN: 29 mg/dL — ABNORMAL HIGH (ref 8–23)
CO2: 14 mmol/L — ABNORMAL LOW (ref 22–32)
Calcium: 8.1 mg/dL — ABNORMAL LOW (ref 8.9–10.3)
Chloride: 110 mmol/L (ref 98–111)
Creatinine, Ser: 1.57 mg/dL — ABNORMAL HIGH (ref 0.44–1.00)
GFR calc Af Amer: 37 mL/min — ABNORMAL LOW (ref >60)
GFR calc non Af Amer: 32 mL/min — ABNORMAL LOW (ref >60)
Glucose, Bld: 146 mg/dL — ABNORMAL HIGH (ref 70–99)
Potassium: 5 mmol/L (ref 3.5–5.1)
Sodium: 142 mmol/L (ref 135–145)

## 2019-12-21 LAB — TROPONIN I (HIGH SENSITIVITY)
Troponin I (High Sensitivity): 55 ng/L — ABNORMAL HIGH (ref <18)
Troponin I (High Sensitivity): 54 ng/L — ABNORMAL HIGH (ref <18)

## 2019-12-21 LAB — BRAIN NATRIURETIC PEPTIDE: B Natriuretic Peptide: 2054.2 pg/mL — ABNORMAL HIGH (ref 0.0–100.0)

## 2019-12-21 LAB — SARS CORONAVIRUS 2 (TAT 6-24 HRS): SARS Coronavirus 2: NEGATIVE

## 2019-12-21 LAB — POC SARS CORONAVIRUS 2 AG -  ED: SARS Coronavirus 2 Ag: NEGATIVE

## 2019-12-21 MED ORDER — NITROGLYCERIN IN D5W 200-5 MCG/ML-% IV SOLN
0.0000 ug/min | INTRAVENOUS | Status: DC
  Administered 2019-12-21: 100 ug/min via INTRAVENOUS

## 2019-12-21 MED ORDER — ONDANSETRON HCL 4 MG/2ML IJ SOLN: 4.0000 mg | Freq: Four times a day (QID) | INTRAMUSCULAR | Status: DC | PRN

## 2019-12-21 MED ORDER — ACETAMINOPHEN 500 MG PO TABS: 1000.0000 mg | ORAL_TABLET | Freq: Once | ORAL | Status: DC

## 2019-12-21 MED ORDER — SODIUM CHLORIDE 0.9 % IV SOLN: 250.0000 mL | INTRAVENOUS | Status: DC | PRN

## 2019-12-21 MED ORDER — ENOXAPARIN SODIUM 300 MG/3ML IJ SOLN
20.0000 mg | INTRAMUSCULAR | Status: DC
  Administered 2019-12-21 – 2019-12-24 (×4): 20 mg via SUBCUTANEOUS
  Filled 2019-12-21 (×6): qty 0.2

## 2019-12-21 MED ORDER — SODIUM CHLORIDE 0.9 % IV SOLN
500.0000 mg | INTRAVENOUS | Status: DC
  Administered 2019-12-21: 500 mg via INTRAVENOUS
  Filled 2019-12-21: qty 500

## 2019-12-21 MED ORDER — METOPROLOL SUCCINATE ER 50 MG PO TB24
50.0000 mg | ORAL_TABLET | Freq: Every day | ORAL | Status: DC
  Administered 2019-12-21 – 2019-12-25 (×5): 50 mg via ORAL
  Filled 2019-12-21 (×6): qty 1

## 2019-12-21 MED ORDER — LABETALOL HCL 5 MG/ML IV SOLN: 10.0000 mg | Freq: Once | INTRAVENOUS | Status: DC

## 2019-12-21 MED ORDER — ADULT MULTIVITAMIN W/MINERALS CH
1.0000 | ORAL_TABLET | Freq: Every day | ORAL | Status: DC
  Administered 2019-12-21 – 2019-12-25 (×5): 1 via ORAL
  Filled 2019-12-21 (×5): qty 1

## 2019-12-21 MED ORDER — SODIUM CHLORIDE 0.9 % IV SOLN
1.0000 g | Freq: Once | INTRAVENOUS | Status: AC
  Administered 2019-12-21: 1 g via INTRAVENOUS
  Filled 2019-12-21: qty 10

## 2019-12-21 MED ORDER — NICOTINE 14 MG/24HR TD PT24
14.0000 mg | MEDICATED_PATCH | Freq: Every day | TRANSDERMAL | Status: DC
  Administered 2019-12-21 – 2019-12-25 (×5): 14 mg via TRANSDERMAL
  Filled 2019-12-21 (×5): qty 1

## 2019-12-21 MED ORDER — PANTOPRAZOLE SODIUM 40 MG PO TBEC
40.0000 mg | DELAYED_RELEASE_TABLET | Freq: Two times a day (BID) | ORAL | Status: DC
  Administered 2019-12-21 – 2019-12-25 (×9): 40 mg via ORAL
  Filled 2019-12-21 (×4): qty 1
  Filled 2019-12-21: qty 2
  Filled 2019-12-21 (×4): qty 1

## 2019-12-21 MED ORDER — FUROSEMIDE 20 MG PO TABS: 40.0000 mg | ORAL_TABLET | Freq: Every day | ORAL | Status: DC

## 2019-12-21 MED ORDER — OXYCODONE-ACETAMINOPHEN 7.5-325 MG PO TABS
1.0000 | ORAL_TABLET | Freq: Four times a day (QID) | ORAL | Status: DC | PRN
  Administered 2019-12-21 – 2019-12-25 (×10): 1 via ORAL
  Filled 2019-12-21 (×10): qty 1

## 2019-12-21 MED ORDER — SODIUM BICARBONATE 650 MG PO TABS
650.0000 mg | ORAL_TABLET | Freq: Two times a day (BID) | ORAL | Status: DC
  Administered 2019-12-21 – 2019-12-22 (×3): 650 mg via ORAL
  Filled 2019-12-21 (×3): qty 1

## 2019-12-21 MED ORDER — FUROSEMIDE 10 MG/ML IJ SOLN
80.0000 mg | Freq: Once | INTRAMUSCULAR | Status: AC
  Administered 2019-12-21: 40 mg via INTRAVENOUS
  Filled 2019-12-21: qty 8

## 2019-12-21 MED ORDER — DIPHENHYDRAMINE HCL 25 MG PO CAPS
25.0000 mg | ORAL_CAPSULE | Freq: Four times a day (QID) | ORAL | Status: DC | PRN
  Administered 2019-12-22: 25 mg via ORAL
  Filled 2019-12-21: qty 1

## 2019-12-21 MED ORDER — FUROSEMIDE 10 MG/ML IJ SOLN
40.0000 mg | Freq: Every day | INTRAMUSCULAR | Status: DC
  Administered 2019-12-21 – 2019-12-22 (×2): 40 mg via INTRAVENOUS
  Filled 2019-12-21 (×2): qty 4

## 2019-12-21 MED ORDER — ATORVASTATIN CALCIUM 10 MG PO TABS
20.0000 mg | ORAL_TABLET | Freq: Every day | ORAL | Status: DC
  Administered 2019-12-21 – 2019-12-24 (×4): 20 mg via ORAL
  Filled 2019-12-21 (×4): qty 2

## 2019-12-21 MED ORDER — MULTIVITAMINS PO CAPS: 1.0000 | ORAL_CAPSULE | Freq: Every day | ORAL | Status: DC

## 2019-12-21 MED ORDER — LABETALOL HCL 5 MG/ML IV SOLN: 10.0000 mg | INTRAVENOUS | Status: DC | PRN

## 2019-12-21 MED ORDER — ACETAMINOPHEN 325 MG PO TABS
650.0000 mg | ORAL_TABLET | ORAL | Status: DC | PRN
  Administered 2019-12-21 (×2): 650 mg via ORAL
  Filled 2019-12-21 (×3): qty 2

## 2019-12-21 MED ORDER — SODIUM CHLORIDE 0.9 % IV SOLN
2.0000 g | INTRAVENOUS | Status: DC
  Administered 2019-12-21 – 2019-12-22 (×2): 2 g via INTRAVENOUS
  Filled 2019-12-21 (×2): qty 20
  Filled 2019-12-21: qty 2
  Filled 2019-12-21: qty 20

## 2019-12-21 MED ORDER — DULOXETINE HCL 60 MG PO CPEP
60.0000 mg | ORAL_CAPSULE | Freq: Two times a day (BID) | ORAL | Status: DC
  Administered 2019-12-21 – 2019-12-25 (×9): 60 mg via ORAL
  Filled 2019-12-21 (×9): qty 1

## 2019-12-21 MED ORDER — SODIUM CHLORIDE 0.9% FLUSH: 3.0000 mL | INTRAVENOUS | Status: DC | PRN

## 2019-12-21 MED ORDER — SODIUM CHLORIDE 0.9 % IV SOLN
500.0000 mg | Freq: Once | INTRAVENOUS | Status: AC
  Administered 2019-12-21: 500 mg via INTRAVENOUS
  Filled 2019-12-21: qty 500

## 2019-12-21 MED ORDER — ACETAMINOPHEN 650 MG RE SUPP
650.0000 mg | Freq: Once | RECTAL | Status: AC
  Administered 2019-12-21: 650 mg via RECTAL
  Filled 2019-12-21: qty 1

## 2019-12-21 MED ORDER — ALBUTEROL SULFATE (2.5 MG/3ML) 0.083% IN NEBU: 2.5000 mg | INHALATION_SOLUTION | Freq: Four times a day (QID) | RESPIRATORY_TRACT | Status: DC | PRN

## 2019-12-21 MED ORDER — CAPTOPRIL 12.5 MG PO TABS
12.5000 mg | ORAL_TABLET | Freq: Once | ORAL | Status: DC
  Filled 2019-12-21: qty 1

## 2019-12-21 MED ORDER — ALLOPURINOL 300 MG PO TABS
300.0000 mg | ORAL_TABLET | Freq: Every day | ORAL | Status: DC
  Administered 2019-12-21 – 2019-12-25 (×5): 300 mg via ORAL
  Filled 2019-12-21 (×5): qty 1

## 2019-12-21 MED ORDER — LEVORPHANOL TARTRATE 2 MG PO TABS: 2.0000 mg | ORAL_TABLET | Freq: Two times a day (BID) | ORAL | Status: DC

## 2019-12-21 MED ORDER — AMLODIPINE BESYLATE 10 MG PO TABS
10.0000 mg | ORAL_TABLET | Freq: Every day | ORAL | Status: DC
  Administered 2019-12-21 – 2019-12-25 (×5): 10 mg via ORAL
  Filled 2019-12-21 (×5): qty 1

## 2019-12-21 MED ORDER — SODIUM CHLORIDE 0.9% FLUSH
3.0000 mL | Freq: Two times a day (BID) | INTRAVENOUS | Status: DC
  Administered 2019-12-21 – 2019-12-25 (×9): 3 mL via INTRAVENOUS

## 2019-12-21 MED ORDER — UMECLIDINIUM BROMIDE 62.5 MCG/INH IN AEPB
1.0000 | INHALATION_SPRAY | Freq: Every day | RESPIRATORY_TRACT | Status: DC
  Administered 2019-12-21 – 2019-12-25 (×5): 1 via RESPIRATORY_TRACT
  Filled 2019-12-21: qty 7

## 2019-12-21 NOTE — ED Notes (Signed)
Pt placed on purewick 

## 2019-12-21 NOTE — ED Notes (Signed)
Goal bp is below 180.  Give Labetalol only if above 180.  Also gave verbal order to only give 40 of Lasix.

## 2019-12-21 NOTE — ED Triage Notes (Signed)
Per EMS, pt experienced sudden onset of SOB.  Pt has hx of CHF and COPD.  Found wheezing, ronci and rales in all lobes.  Was given 125 solmedual and a duo neb.  Reports pain in lower left ribs,   230/110 98.8 temp 35 RR 93% on CPAP

## 2019-12-21 NOTE — Plan of Care (Signed)
  Problem: Education: Goal: Ability to verbalize understanding of medication therapies will improve 12/21/2019 1245 by Arlina Robes, RN Outcome: Progressing 12/21/2019 1243 by Arlina Robes, RN Outcome: Progressing   Problem: Education: Goal: Individualized Educational Video(s) 12/21/2019 1245 by Arlina Robes, RN Outcome: Progressing 12/21/2019 1243 by Arlina Robes, RN Outcome: Progressing   Problem: Activity: Goal: Capacity to carry out activities will improve 12/21/2019 1245 by Arlina Robes, RN Outcome: Progressing 12/21/2019 1243 by Arlina Robes, RN Outcome: Progressing   Problem: Cardiac: Goal: Ability to achieve and maintain adequate cardiopulmonary perfusion will improve 12/21/2019 1245 by Arlina Robes, RN Outcome: Progressing 12/21/2019 1243 by Arlina Robes, RN Outcome: Progressing   Problem: Education: Goal: Knowledge of disease or condition will improve 12/21/2019 1245 by Arlina Robes, RN Outcome: Progressing 12/21/2019 1243 by Arlina Robes, RN Outcome: Progressing   Problem: Activity: Goal: Will verbalize the importance of balancing activity with adequate rest periods 12/21/2019 1245 by Arlina Robes, RN Outcome: Progressing 12/21/2019 1243 by Arlina Robes, RN Outcome: Progressing   Problem: Activity: Goal: Ability to tolerate increased activity will improve 12/21/2019 1245 by Arlina Robes, RN Outcome: Progressing 12/21/2019 1243 by Arlina Robes, RN Outcome: Progressing   Problem: Education: Goal: Individualized Educational Video(s) 12/21/2019 1245 by Arlina Robes, RN Outcome: Progressing 12/21/2019 1243 by Arlina Robes, RN Outcome: Progressing   Problem: Education: Goal: Knowledge of the prescribed therapeutic regimen will improve 12/21/2019 1245 by Arlina Robes, RN Outcome: Progressing 12/21/2019 1243 by Arlina Robes, RN Outcome: Progressing   Problem:  Education: Goal: Knowledge of disease or condition will improve 12/21/2019 1245 by Arlina Robes, RN Outcome: Progressing 12/21/2019 1243 by Arlina Robes, RN Outcome: Progressing   Problem: Respiratory: Goal: Ability to maintain adequate ventilation will improve 12/21/2019 1245 by Arlina Robes, RN Outcome: Progressing 12/21/2019 1243 by Arlina Robes, RN Outcome: Progressing   Problem: Respiratory: Goal: Levels of oxygenation will improve 12/21/2019 1245 by Arlina Robes, RN Outcome: Progressing 12/21/2019 1243 by Arlina Robes, RN Outcome: Progressing   Problem: Respiratory: Goal: Ability to maintain a clear airway will improve 12/21/2019 1245 by Arlina Robes, RN Outcome: Progressing 12/21/2019 1243 by Arlina Robes, RN Outcome: Progressing

## 2019-12-21 NOTE — Progress Notes (Addendum)
Patient seen and examined, admitted earlier this morning by Dr. Alcario Drought -chronically ill female with history of COPD, chronic diastolic CHF, ongoing tobacco abuse, CKD stage III, chronic pain, anxiety, hypertension, severe malnutrition presented to the emergency room with acute worsening of her dyspnea early this morning. -Patient admits noncompliance with her medications, she reports that her medications are missing, she still smokes. -In the emergency room she was noted to have hypertensive urgency with respiratory distress, started on BiPAP and nitroglycerin drip, also given IV Lasix, chest x-ray noted progressive interstitial groundglass opacities throughout her lungs and dense consolidation in left lower lobe.  Acute hypoxic and hypercarbic respiratory failure -Improving -Discontinue BiPAP this morning -Suspect primarily pulmonary edema, also cannot rule out concomitant pneumonia given dense consolidation in the left lower lung, COVID-19 PCR is negative -Continue IV Lasix, ceftriaxone, azithromycin -Admits noncompliance with medications, tells me her pills are missing, but she is mostly concerned about her pain medicines only, still smoking  Acute on chronic diastolic CHF -As above, discontinue nitro drip -Continue IV Lasix today  COPD, ongoing tobacco abuse -Counseled, no wheezing, continue nebs  Stage 4 kidney disease -Baseline creatinine around 1.8-2 -Creatinine lower than recent baseline likely secondary to volume overloaded state,  -monitor with diuresis  Chronic anemia -Hemoglobin slightly lower than baseline, monitor, no overt bleeding  Chronic pain syndrome and anxiety -Continue Cymbalta, oxycodone -Resume levorphanol at discharge  Noncompliance Frequent hospitalization -Needs additional home support, monitoring  Domenic Polite, MD

## 2019-12-21 NOTE — H&P (Signed)
History and Physical    [name] GNF:[id number] DOB: August 26, 1946 DOA: 12/21/2019  PCP: [name], MD  Patient coming from: Home  I have personally briefly reviewed patient's old medical records in [place]  Chief Complaint: Respiratory distress  HPI: [name] is a 74 y.o. female with medical history significant of COPD, CKD stage 3, HTN, HFpEF as of echo in Jan.  Patient admitted 2x in Jan with acute respiratory failure with hypoxia due to flash pulm edema.  Reading though the 1/26 DC summary, looks like initial BP in the ED was 846 systolic at that time as well.  Tonight patient presents to the ED in respiratory distress.  Symptoms onset 1h before calling EMS.  Dyspnea worsened.  Feels identical to last time she had pulm edema last month.  She admits to EDP that shes not really taking any of her home meds.   ED Course: Pt put on rescue BIPAP.  Initial BP 231/100!  Started on NTG gtt, got up to rate of 300, BP finally came down.  Kept going down.  And is now down somewhat more than we would like it in the 100-120 SBP range.  NTG gtt shut off completely.  Pt states she feels much better.  Still on BIPAP for the moment.  Lasix 40 given in ED.   Review of Systems: As per HPI, otherwise all review of systems negative.  Past Medical History:  Diagnosis Date  . Anxiety and depression   . Arthritis   . Asthma   . Carotid artery occlusion   . Chronic kidney disease   . Chronic pain    leg and feet  . COPD (chronic obstructive pulmonary disease) ([place])   . Coronary artery disease   . DDD (degenerative disc disease)   . Diarrhea    chronic   . DVT (deep venous thrombosis) ([place])   . Dyspnea   . Fibromyalgia   . GERD (gastroesophageal reflux disease)   . Grave's disease   . Headache   . Hypertension   . OP (osteoporosis)   . Parkinson's disease ([place])   . Peripheral vascular disease ([place])   . PUD (peptic ulcer disease)   . Recurrent upper  respiratory infection (URI)   . Rhinitis   . Sleep apnea   . Spinal stenosis of lumbar region 04/23/2013  . Thrombophlebitis   . Tobacco use disorder 04/23/2013  . Vitamin B 12 deficiency     Past Surgical History:  Procedure Laterality Date  . CAROTID ENDARTERECTOMY Left Sept. 20,2011   cea  . CHOLECYSTECTOMY  1999  . GASTRECTOMY     age 51   Part of small intestin and part of stomach  . LEFT HEART CATHETERIZATION WITH CORONARY ANGIOGRAM N/A 10/02/2011   Procedure: LEFT HEART CATHETERIZATION WITH CORONARY ANGIOGRAM;  Surgeon: Sueanne Margarita, MD;  Location: [facility] CATH [facility];  Service: Cardiovascular;  Laterality: N/A;     reports that she has been smoking cigarettes. She has a 100.00 pack-year smoking history. She has never used smokeless tobacco. She reports that she does not drink alcohol or use drugs.  No Known Allergies  Family History  Problem Relation Age of Onset  . Diabetes Mother   . Hyperlipidemia Mother   . Heart attack Mother   . Other Mother        varicose veins,respiratory,stroke  . Heart disease Mother        before age 90  . Hypertension Mother   .  Varicose Veins Mother   . Diabetes Father   . Heart disease Father        before age 4  . Hyperlipidemia Father   . Heart attack Father   . Other Father        varicose veins  . Hypertension Father   . Varicose Veins Father   . Diabetes Sister   . Heart disease Sister        before age 14  . Hyperlipidemia Sister   . Heart attack Sister   . Other Sister        varicose veins  . Hypertension Sister   . Varicose Veins Sister   . Peripheral vascular disease Sister   . Diabetes Sister   . Hyperlipidemia Sister   . Hypertension Sister   . Varicose Veins Sister      Prior to Admission medications   Medication Sig Start Date End Date Taking? Authorizing Provider  albuterol (PROVENTIL) (2.5 MG/3ML) 0.083% nebulizer solution Inhale 3 mLs into the lungs 2 (two) times daily. 11/09/19   Regalado, Belkys A, MD    albuterol (VENTOLIN HFA) 108 (90 Base) MCG/ACT inhaler Inhale 2 puffs into the lungs every 6 (six) hours as needed for wheezing or shortness of breath. 11/09/19   Regalado, Belkys A, MD  allopurinol (ZYLOPRIM) 300 MG tablet Take 1 tablet (300 mg total) by mouth daily. 11/09/19   Regalado, Belkys A, MD  amLODipine (NORVASC) 10 MG tablet Take 1 tablet (10 mg total) by mouth daily. 11/09/19   Regalado, Belkys A, MD  atorvastatin (LIPITOR) 20 MG tablet Take 1 tablet (20 mg total) by mouth at bedtime. 11/09/19   Regalado, Belkys A, MD  diphenhydrAMINE (BENADRYL) 25 mg capsule Take 25 mg by mouth every 6 (six) hours as needed for allergies.    [provider]  DULoxetine (CYMBALTA) 60 MG capsule Take 60 mg by mouth 2 (two) times daily. 10/13/19   [provider]  furosemide (LASIX) 40 MG tablet Take 1 tablet (40 mg total) by mouth daily. 11/24/19 12/24/19  Arrien, Jimmy Picket, MD  levorphanol (LEVODROMORAN) 2 MG tablet Take 2 mg by mouth 2 (two) times daily. 07/07/18   [provider]  metoprolol succinate (TOPROL-XL) 50 MG 24 hr tablet Take 1 tablet (50 mg total) by mouth daily. Take with or immediately following a meal. 11/10/19   Regalado, Belkys A, MD  Multiple Vitamin (MULTIVITAMIN) capsule Take 1 capsule by mouth daily.    [provider]  nicotine (NICODERM CQ - DOSED IN MG/24 HOURS) 14 mg/24hr patch Place 1 patch (14 mg total) onto the skin daily. 11/09/19   Regalado, Belkys A, MD  oxyCODONE-acetaminophen (PERCOCET) 7.5-325 MG tablet Take 1 tablet by mouth 4 (four) times daily as needed for severe pain.  10/09/19   [provider]  pantoprazole (PROTONIX) 40 MG tablet Take 40 mg by mouth 2 (two) times daily. 10/28/19   [provider]  umeclidinium bromide (INCRUSE ELLIPTA) 62.5 MCG/INH AEPB Inhale 1 puff into the lungs daily. 11/10/19   Elmarie Shiley, MD    Physical Exam: Vitals:   12/21/19 0330 12/21/19 0333 12/21/19 0336 12/21/19 0339  BP:  119/72 133/74 133/74 136/77  Pulse: 94 92 92 92  Resp: (!) 26 (!) 23 (!) 22 (!) 21  Temp:      TempSrc:      SpO2: 100% 100% 100% 100%  Weight:      Height:        Constitutional: NAD,  calm, comfortable Eyes: PERRL, lids and conjunctivae normal ENMT: Mucous membranes are moist. Posterior pharynx clear of any exudate or lesions.Normal dentition.  Neck: normal, supple, no masses, no thyromegaly Respiratory: clear to auscultation bilaterally, no wheezing, no crackles. Normal respiratory effort. No accessory muscle use.  Cardiovascular: Regular rate and rhythm, no murmurs / rubs / gallops. No extremity edema. 2+ pedal pulses. No carotid bruits.  Abdomen: no tenderness, no masses palpated. No hepatosplenomegaly. Bowel sounds positive.  Musculoskeletal: no clubbing / cyanosis. No joint deformity upper and lower extremities. Good ROM, no contractures. Normal muscle tone.  Skin: no rashes, lesions, ulcers. No induration Neurologic: CN 2-12 grossly intact. Sensation intact, DTR normal. Strength 5/5 in all 4.  Psychiatric: Normal judgment and insight. Alert and oriented x 3. Normal mood.    Labs on Admission: I have personally reviewed following labs and imaging studies  CBC: Recent Labs  Lab 12/21/19 0139 12/21/19 0147  WBC  --  18.8*  NEUTROABS  --  11.7*  HGB 10.9* 11.0*  HCT 32.0* 37.6  MCV  --  96.2  PLT  --  932*   Basic Metabolic Panel: Recent Labs  Lab 12/21/19 0139 12/21/19 0147  NA 141 142  K 5.0 5.2*  CL  --  115*  CO2  --  13*  GLUCOSE  --  247*  BUN  --  28*  CREATININE  --  1.64*  CALCIUM  --  8.0*   GFR: Estimated Creatinine Clearance: 21.6 mL/min (A) (by C-G formula based on SCr of 1.64 mg/dL (H)). Liver Function Tests: Recent Labs  Lab 12/21/19 0147  AST 34  ALT 12  ALKPHOS 135*  BILITOT 0.7  PROT 6.5  ALBUMIN 3.4*   No results for input(s): LIPASE, AMYLASE in the last 168 hours. No results for input(s): AMMONIA in the last 168  hours. Coagulation Profile: No results for input(s): INR, PROTIME in the last 168 hours. Cardiac Enzymes: No results for input(s): CKTOTAL, CKMB, CKMBINDEX, TROPONINI in the last 168 hours. BNP (last 3 results) No results for input(s): PROBNP in the last 8760 hours. HbA1C: No results for input(s): HGBA1C in the last 72 hours. CBG: No results for input(s): GLUCAP in the last 168 hours. Lipid Profile: No results for input(s): CHOL, HDL, LDLCALC, TRIG, CHOLHDL, LDLDIRECT in the last 72 hours. Thyroid Function Tests: No results for input(s): TSH, T4TOTAL, FREET4, T3FREE, THYROIDAB in the last 72 hours. Anemia Panel: No results for input(s): VITAMINB12, FOLATE, FERRITIN, TIBC, IRON, RETICCTPCT in the last 72 hours. Urine analysis:    Component Value Date/Time   COLORURINE STRAW (A) 11/04/2019 0911   APPEARANCEUR CLEAR 11/04/2019 0911   LABSPEC 1.010 11/04/2019 0911   PHURINE 5.0 11/04/2019 0911   GLUCOSEU NEGATIVE 11/04/2019 0911   HGBUR NEGATIVE 11/04/2019 0911   BILIRUBINUR NEGATIVE 11/04/2019 0911   KETONESUR NEGATIVE 11/04/2019 0911   PROTEINUR NEGATIVE 11/04/2019 0911   UROBILINOGEN 0.2 07/28/2015 2223   NITRITE NEGATIVE 11/04/2019 0911   LEUKOCYTESUR MODERATE (A) 11/04/2019 0911    Radiological Exams on Admission: DG Chest Portable 1 View  Result Date: 12/21/2019 CLINICAL DATA:  Short of breath, respiratory distress EXAM: PORTABLE CHEST 1 VIEW COMPARISON:  11/19/2019 FINDINGS: Single frontal view of the chest demonstrates borderline size of the cardiac silhouette, likely due to portable AP technique. Slight decrease in left pleural effusion since prior study. There is increasing left lower lobe consolidation primarily in the retrocardiac region. There is been progression of the diffuse interstitial and ground-glass opacity seen  previously. No pneumothorax. No acute bony abnormalities. IMPRESSION: 1. Progressive interstitial and ground-glass opacities throughout the lungs. This  could reflect worsening volume status, though superimposed atypical pneumonia cannot be excluded. 2. Dense consolidation left lower lobe, with associated small residual left pleural effusion. Electronically Signed   By: Randa Ngo M.D.   On: 12/21/2019 01:40    EKG: Independently reviewed.  Assessment/Plan Principal Problem:   Flash pulmonary edema (HCC) Active Problems:   DNR (do not resuscitate)   Hypertensive urgency   Acute on chronic diastolic CHF (congestive heart failure) (HCC)   CKD (chronic kidney disease) stage 3, GFR 30-59 ml/min    1. Flash pulm edema / HTN urgency / acute on chronic diastolic CHF - due to not taking any home meds it seems 1. CHF pathway 2. NTG gtt off currently 3. PRN labetalol if needed 4. Resume all home BP meds 5. Will leave lasix at 40mg  IV daily (normally 40 PO at home). 6. Daily BMP 7. Tele monitor 8. Cont pulse ox 9. Wean BIPAP as tolerated 2. CKD stage 3 - 1. Daily BMP with diuresis 2. Creat though looks to be about baseline  DVT prophylaxis: Lovenox Code Status: Full Family Communication: No family in room Disposition Plan: Home after admit Consults called: None Admission status: Place in 73    Laqueena Hinchey, [place] [facility]  How to contact the [facility] Attending or Consulting provider [place] or covering provider during after hours [place], for this patient?  1. Check the care team in [facility] and look for a) attending/consulting [facility] provider listed and b) the [facility] team listed 2. Log into [email or web address]  Amion Physician Scheduling and messaging for groups and whole hospitals  On call and physician scheduling software for group practices, residents, hospitalists and other medical providers for call, clinic, rotation and shift schedules. On[employer] is a hospital-wide system for scheduling doctors and paging doctors on call. EasyPlot is for scientific plotting and data analysis.  [email or web address]  and use [place]'s universal  password to access. If you do not have the password, please contact the hospital operator.  3. Locate the [facility] provider you are looking for under [facility] and page to a number that you can be directly reached. 4. If you still have difficulty reaching the provider, please page the [facility] (Director on Call) for the Hospitalists listed on amion for assistance.  12/21/2019, 4:35 AM

## 2019-12-21 NOTE — ED Provider Notes (Signed)
Emergency Department Provider Note   I have reviewed the triage vital signs and the nursing notes.   HISTORY  Chief Complaint Shortness of Breath   HPI [name] is a 74 y.o. female who presents to the emerge department today with acute dyspnea.  Patient states that she acutely became significantly short of breath approximate hour prior to calling EMS.  States that the dyspnea worsened and this feels similar to when she had pulmonary edema last month.  She also has a history of COPD.  She states that she is not sure she is been compliant with any of her medications.  EMS got there and when I able to obtain accurate vital signs secondary to patient distress so started on CPAP and gave her some albuterol in route.  Subsequently patient's wheezing improved and she just had rales and rhonchi.  Denies fever or productive cough.   Patient states that she would not want to be resuscitated if she died but she thinks that she would probably want to be on a ventilator if needed.   No other associated or modifying symptoms.    Past Medical History:  Diagnosis Date  . Anxiety and depression   . Arthritis   . Asthma   . Carotid artery occlusion   . Chronic kidney disease   . Chronic pain    leg and feet  . COPD (chronic obstructive pulmonary disease) ([place])   . Coronary artery disease   . DDD (degenerative disc disease)   . Diarrhea    chronic   . DVT (deep venous thrombosis) ([place])   . Dyspnea   . Fibromyalgia   . GERD (gastroesophageal reflux disease)   . Grave's disease   . Headache   . Hypertension   . OP (osteoporosis)   . Parkinson's disease ([place])   . Peripheral vascular disease ([place])   . PUD (peptic ulcer disease)   . Recurrent upper respiratory infection (URI)   . Rhinitis   . Sleep apnea   . Spinal stenosis of lumbar region 04/23/2013  . Thrombophlebitis   . Tobacco use disorder 04/23/2013  . Vitamin B 12 deficiency     Patient Active Problem List   Diagnosis  Date Noted  . Hypertensive urgency 11/20/2019  . Acute pulmonary edema (HCC)   . Positive D dimer   . Acute respiratory failure with hypoxia ([place]) 11/04/2019  . Acute exacerbation of CHF (congestive heart failure) ([place]) 11/04/2019  . DNR (do not resuscitate) 11/04/2019  . Chronic pain syndrome 11/04/2019  . Pulmonary nodule, left 08/02/2018  . Major depressive disorder, single episode, moderate ([place]) 08/02/2018  . Benzodiazepine overdose of undetermined intent 08/01/2018  . Cellulitis 05/14/2018  . Anemia 05/14/2018  . At risk for adverse drug event 10/17/2017  . Salicylate intoxication, undetermined intent, subsequent encounter 10/10/2017  . Acute renal failure with acute renal cortical necrosis superimposed on stage 3a chronic kidney disease ([place]) 10/10/2017  . Headache 10/10/2017  . Protein-calorie malnutrition, severe 01/26/2016  . Opiate overdose ([place]) 01/25/2016  . Suicide attempt ([employer]) 01/25/2016  . Depression   . Dyslipidemia   . Gastroesophageal reflux disease without esophagitis   . COPD (chronic obstructive pulmonary disease) ([place]) 07/29/2015  . Diabetes mellitus with neurological manifestation ([place]) 07/29/2015  . Frequent falls 07/29/2015  . HTN (hypertension) 07/29/2015  . AKI (acute kidney injury) ([place]) 07/29/2015  . SIRS (systemic inflammatory response syndrome) ([place]) 07/29/2015  . Spinal stenosis of lumbar region 04/23/2013  . Tremor due to multiple  drugs 04/23/2013  . Tobacco use disorder 04/23/2013  . COPD exacerbation ([place]) 04/23/2013  . Occlusion and stenosis of carotid artery without mention of cerebral infarction 03/12/2012  . Viral bronchitis-possible h. infl vs Norovirus 11/21/2011  . Pleuritic chest pain 09/18/2011    Past Surgical History:  Procedure Laterality Date  . CAROTID ENDARTERECTOMY Left Sept. 20,2011   cea  . CHOLECYSTECTOMY  1999  . GASTRECTOMY     age 49   Part of small intestin and part of stomach  . LEFT HEART CATHETERIZATION WITH  CORONARY ANGIOGRAM N/A 10/02/2011   Procedure: LEFT HEART CATHETERIZATION WITH CORONARY ANGIOGRAM;  Surgeon: Sueanne Margarita, MD;  Location: [facility];  Service: Cardiovascular;  Laterality: N/A;    Current Outpatient Rx  . Order #: [id number] Class: Normal  . Order #: [id number] Class: Normal  . Order #: [id number] Class: Normal  . Order #: [id number] Class: No Print  . Order #: [id number] Class: Normal  . Order #: [id number] Class: Historical Med  . Order #: [id number] Class: Historical Med  . Order #: [id number] Class: Normal  . Order #: [id number] Class: Historical Med  . Order #: [id number] Class: Normal  . Order #: [id number] Class: Historical Med  . Order #: [id number] Class: Print  . Order #: [id number] Class: Historical Med  . Order #: [id number] Class: Historical Med  . Order #: [id number] Class: Normal    Allergies Patient has no known allergies.  Family History  Problem Relation Age of Onset  . Diabetes Mother   . Hyperlipidemia Mother   . Heart attack Mother   . Other Mother        varicose veins,respiratory,stroke  . Heart disease Mother        before age 33  . Hypertension Mother   . Varicose Veins Mother   . Diabetes Father   . Heart disease Father        before age 9  . Hyperlipidemia Father   . Heart attack Father   . Other Father        varicose veins  . Hypertension Father   . Varicose Veins Father   . Diabetes Sister   . Heart disease Sister        before age 55  . Hyperlipidemia Sister   . Heart attack Sister   . Other Sister        varicose veins  . Hypertension Sister   . Varicose Veins Sister   . Peripheral vascular disease Sister   . Diabetes Sister   . Hyperlipidemia Sister   . Hypertension Sister   . Varicose Veins Sister     Social History Social History   Tobacco Use  . Smoking status: Current Every Day Smoker    Packs/day: 2.00    Years: 50.00    Pack years: 100.00    Types: Cigarettes  . Smokeless tobacco: Never Used  . Tobacco comment:  cessation info given and reviewed  Substance Use Topics  . Alcohol use: No    Alcohol/week: 0.0 standard drinks  . Drug use: No    Review of Systems  All other systems negative except as documented in the HPI. All pertinent positives and negatives as reviewed in the HPI. ____________________________________________   PHYSICAL EXAM:  VITAL SIGNS: ED Triage Vitals  Enc Vitals Group     BP 12/21/19 0135 (!) 231/100     Pulse Rate 12/21/19 0135 (!) 108     Resp 12/21/19 0135 (!) 35     Temp --      Temp src --  SpO2 12/21/19 0135 93 %     Weight 12/21/19 0144 100 lb (45.4 kg)     Height 12/21/19 0144 5\' 4"  (1.626 m)    Constitutional: Alert and oriented. Well appearing and in no acute distress. Eyes: Conjunctivae are normal. PERRL. EOMI. Head: Atraumatic. Nose: No congestion/rhinnorhea. Mouth/Throat: Mucous membranes are moist.  Oropharynx non-erythematous. Neck: No stridor.  No meningeal signs.   Cardiovascular: Normal rate, regular rhythm. Good peripheral circulation. Grossly normal heart sounds.   Respiratory: Normal respiratory effort.  No retractions. Lungs CTAB. Gastrointestinal: Soft and nontender. No distention.  Musculoskeletal: No lower extremity tenderness nor edema. No gross deformities of extremities. Neurologic:  Normal speech and language. No gross focal neurologic deficits are appreciated.  Skin:  Skin is warm, dry and intact. No rash noted.   ____________________________________________   LABS (all labs ordered are listed, but only abnormal results are displayed)  Labs Reviewed  CBC WITH DIFFERENTIAL/PLATELET - Abnormal; Notable for the following components:      Result Value   WBC 18.8 (*)    Hemoglobin 11.0 (*)    MCHC 29.3 (*)    RDW 17.4 (*)    Platelets 446 (*)    All other components within normal limits  COMPREHENSIVE METABOLIC PANEL - Abnormal; Notable for the following components:   Potassium 5.2 (*)    Chloride 115 (*)    CO2 13  (*)    Glucose, Bld 247 (*)    BUN 28 (*)    Creatinine, Ser 1.64 (*)    Calcium 8.0 (*)    Albumin 3.4 (*)    Alkaline Phosphatase 135 (*)    GFR calc non Af Amer 30 (*)    GFR calc Af Amer 35 (*)    All other components within normal limits  POCT I-STAT 7, (LYTES, BLD GAS, ICA,H+H) - Abnormal; Notable for the following components:   pH, Arterial 7.150 (*)    Bicarbonate 14.1 (*)    TCO2 15 (*)    Acid-base deficit 14.0 (*)    HCT 32.0 (*)    Hemoglobin 10.9 (*)    All other components within normal limits  TROPONIN I (HIGH SENSITIVITY) - Abnormal; Notable for the following components:   Troponin I (High Sensitivity) 55 (*)    All other components within normal limits  SARS CORONAVIRUS 2 (TAT 6-24 HRS)  CULTURE, BLOOD (ROUTINE X 2)  CULTURE, BLOOD (ROUTINE X 2)  BRAIN NATRIURETIC PEPTIDE  LACTIC ACID, PLASMA  LACTIC ACID, PLASMA  POC SARS CORONAVIRUS 2 AG -  ED   ____________________________________________  EKG   EKG Interpretation  Date/Time:  Monday December 21 2019 01:35:48 EST Ventricular Rate:  110 PR Interval:    QRS Duration: 88 QT Interval:  308 QTC Calculation: 417 R Axis:   12 Text Interpretation: Sinus tachycardia Probable left atrial enlargement LVH with secondary repolarization abnormality similar to previous Confirmed by Merrily Pew [phone]) on 12/21/2019 1:41:45 AM      ____________________________________________  RADIOLOGY  DG Chest Portable 1 View  Result Date: 12/21/2019 CLINICAL DATA:  Short of breath, respiratory distress EXAM: PORTABLE CHEST 1 VIEW COMPARISON:  11/19/2019 FINDINGS: Single frontal view of the chest demonstrates borderline size of the cardiac silhouette, likely due to portable AP technique. Slight decrease in left pleural effusion since prior study. There is increasing left lower lobe consolidation primarily in the retrocardiac region. There is been progression of the diffuse interstitial and ground-glass opacity seen  previously. No pneumothorax. No acute bony abnormalities.  IMPRESSION: 1. Progressive interstitial and ground-glass opacities throughout the lungs. This could reflect worsening volume status, though superimposed atypical pneumonia cannot be excluded. 2. Dense consolidation left lower lobe, with associated small residual left pleural effusion. Electronically Signed   By: Randa Ngo M.D.   On: 12/21/2019 01:40   ____________________________________________   PROCEDURES  Procedure(s) performed:   .Critical Care Performed by: Merrily Pew, MD Authorized by: Merrily Pew, MD   Critical care provider statement:    Critical care time (minutes):  48   Critical care was time spent personally by me on the following activities:  Discussions with consultants, evaluation of patient's response to treatment, examination of patient, ordering and performing treatments and interventions, ordering and review of laboratory studies, ordering and review of radiographic studies, pulse oximetry, re-evaluation of patient's condition, obtaining history from patient or surrogate and review of old charts   ____________________________________________   INITIAL IMPRESSION / [place] / ED COURSE  Patient is afebrile with hypertension, cardia rails and a recent history of CHF exacerbation not on her medications.  Initially concern for acute pulmonary edema so started on nitroglycerin drip.  Started on close circuit BiPAP pending her Covid test which ended up being negative.  Rapidly titrated up her nitroglycerin drip to approximately 300 mcg/min at which point She started having improvement in her breathing and her blood pressure.  Slowly started titrating those down.  X-ray interpreted by me looks most consistent with interstitial opacities consistent with pulmonary edema however radiology noted a possible consolidative area so antibiotics given.  We will continue to titrate down her nitroglycerin and  discussed with hospitalist for admission. Blood pressure significantly improved to the low 100s so rapidly titrated the nitroglycerin to off.  After discussed with hospitalist will go ahead and order dose of labetalol as needed.  Lasix.  Admit.  Pertinent labs & imaging results that were available during my care of the patient were reviewed by me and considered in my medical decision making (see chart for details). ____________________________________________  FINAL CLINICAL IMPRESSION(S) / ED DIAGNOSES  Final diagnoses:  Hypoxia  Acute on chronic congestive heart failure, unspecified heart failure type ([place])    MEDICATIONS GIVEN DURING THIS VISIT:  Medications  nitroGLYCERIN 50 mg in dextrose 5 % 250 mL (0.2 mg/mL) infusion (300 mcg/min Intravenous Rate/Dose Change 12/21/19 0210)  cefTRIAXone (ROCEPHIN) 1 g in sodium chloride 0.9 % 100 mL IVPB (has no administration in time range)  azithromycin (ZITHROMAX) 500 mg in sodium chloride 0.9 % 250 mL IVPB (has no administration in time range)  acetaminophen (TYLENOL) suppository 650 mg (has no administration in time range)    NEW OUTPATIENT MEDICATIONS STARTED DURING THIS VISIT:  New Prescriptions   No medications on file    Note:  This note was prepared with assistance of Dragon voice recognition software. Occasional wrong-word or sound-a-like substitutions may have occurred due to the inherent limitations of voice recognition software.   Dimitra [place]stock, Corene Cornea, MD 12/21/19 [phone]

## 2019-12-21 NOTE — ED Notes (Signed)
Dr Dayna Barker informed of POC covid neg

## 2019-12-21 NOTE — Plan of Care (Signed)
Problem: Education: Goal: Knowledge of General Education information will improve Description: Including pain rating scale, medication(s)/side effects and non-pharmacologic comfort measures Outcome: Progressing   Problem: Health Behavior/Discharge Planning: Goal: Ability to manage health-related needs will improve Outcome: Progressing   Problem: Clinical Measurements: Goal: Ability to maintain clinical measurements within normal limits will improve Outcome: Progressing Goal: Will remain free from infection Outcome: Progressing Goal: Diagnostic test results will improve Outcome: Progressing Goal: Respiratory complications will improve Outcome: Progressing Goal: Cardiovascular complication will be avoided Outcome: Progressing   Problem: Activity: Goal: Risk for activity intolerance will decrease Outcome: Progressing   Problem: Nutrition: Goal: Adequate nutrition will be maintained Outcome: Progressing   Problem: Coping: Goal: Level of anxiety will decrease Outcome: Progressing   Problem: Elimination: Goal: Will not experience complications related to bowel motility Outcome: Progressing Goal: Will not experience complications related to urinary retention Outcome: Progressing   Problem: Pain Managment: Goal: General experience of comfort will improve Outcome: Progressing   Problem: Safety: Goal: Ability to remain free from injury will improve Outcome: Progressing   Problem: Skin Integrity: Goal: Risk for impaired skin integrity will decrease Outcome: Progressing   Problem: Education: Goal: Ability to demonstrate management of disease process will improve Outcome: Progressing Goal: Ability to verbalize understanding of medication therapies will improve Outcome: Progressing Goal: Individualized Educational Video(s) Outcome: Progressing   Problem: Activity: Goal: Capacity to carry out activities will improve Outcome: Progressing   Problem: Cardiac: Goal:  Ability to achieve and maintain adequate cardiopulmonary perfusion will improve Outcome: Progressing   Problem: Education: Goal: Knowledge of disease or condition will improve Outcome: Progressing Goal: Knowledge of the prescribed therapeutic regimen will improve Outcome: Progressing Goal: Individualized Educational Video(s) Outcome: Progressing   Problem: Activity: Goal: Ability to tolerate increased activity will improve Outcome: Progressing Goal: Will verbalize the importance of balancing activity with adequate rest periods Outcome: Progressing   Problem: Respiratory: Goal: Ability to maintain a clear airway will improve Outcome: Progressing Goal: Levels of oxygenation will improve Outcome: Progressing Goal: Ability to maintain adequate ventilation will improve Outcome: Progressing   Problem: Health Behavior/Discharge Planning: Goal: Ability to manage health-related needs will improve Outcome: Progressing   Problem: Clinical Measurements: Goal: Ability to maintain clinical measurements within normal limits will improve Outcome: Progressing   Problem: Pain Managment: Goal: General experience of comfort will improve Outcome: Progressing   Problem: Elimination: Goal: Will not experience complications related to urinary retention Outcome: Progressing   Problem: Elimination: Goal: Will not experience complications related to bowel motility Outcome: Progressing   Problem: Coping: Goal: Level of anxiety will decrease Outcome: Progressing   Problem: Nutrition: Goal: Adequate nutrition will be maintained Outcome: Progressing   Problem: Activity: Goal: Risk for activity intolerance will decrease Outcome: Progressing   Problem: Clinical Measurements: Goal: Cardiovascular complication will be avoided Outcome: Progressing   Problem: Education: Goal: Individualized Educational Video(s) Outcome: Progressing   Problem: Education: Goal: Ability to verbalize  understanding of medication therapies will improve Outcome: Progressing   Problem: Education: Goal: Ability to demonstrate management of disease process will improve Outcome: Progressing   Problem: Activity: Goal: Capacity to carry out activities will improve Outcome: Progressing   Problem: Cardiac: Goal: Ability to achieve and maintain adequate cardiopulmonary perfusion will improve Outcome: Progressing   Problem: Education: Goal: Knowledge of disease or condition will improve Outcome: Progressing   Problem: Education: Goal: Knowledge of the prescribed therapeutic regimen will improve Outcome: Progressing   Problem: Education: Goal: Individualized Educational Video(s) Outcome: Progressing   Problem: Activity: Goal: Ability  to tolerate increased activity will improve Outcome: Progressing   Problem: Respiratory: Goal: Ability to maintain adequate ventilation will improve Outcome: Progressing   Problem: Respiratory: Goal: Levels of oxygenation will improve Outcome: Progressing   Problem: Respiratory: Goal: Ability to maintain a clear airway will improve Outcome: Progressing

## 2019-12-21 NOTE — Progress Notes (Signed)
Bipap now PRN order, no distress noted, patient currently doing well on 6L [place], will continue to monitor.

## 2019-12-21 NOTE — ED Notes (Signed)
Provider bedside again

## 2019-12-22 ENCOUNTER — Inpatient Hospital Stay (HOSPITAL_COMMUNITY): Payer: Medicare Other

## 2019-12-22 LAB — CBC
HCT: 27.8 % — ABNORMAL LOW (ref 36.0–46.0)
Hemoglobin: 8.2 g/dL — ABNORMAL LOW (ref 12.0–15.0)
MCH: 27.2 pg (ref 26.0–34.0)
MCHC: 29.5 g/dL — ABNORMAL LOW (ref 30.0–36.0)
MCV: 92.1 fL (ref 80.0–100.0)
Platelets: 269 10*3/uL (ref 150–400)
RBC: 3.02 MIL/uL — ABNORMAL LOW (ref 3.87–5.11)
RDW: 17 % — ABNORMAL HIGH (ref 11.5–15.5)
WBC: 7.9 10*3/uL (ref 4.0–10.5)
nRBC: 0 % (ref 0.0–0.2)

## 2019-12-22 LAB — BASIC METABOLIC PANEL
Anion gap: 10 (ref 5–15)
BUN: 30 mg/dL — ABNORMAL HIGH (ref 8–23)
CO2: 21 mmol/L — ABNORMAL LOW (ref 22–32)
Calcium: 8.3 mg/dL — ABNORMAL LOW (ref 8.9–10.3)
Chloride: 109 mmol/L (ref 98–111)
Creatinine, Ser: 1.74 mg/dL — ABNORMAL HIGH (ref 0.44–1.00)
GFR calc Af Amer: 33 mL/min — ABNORMAL LOW (ref >60)
GFR calc non Af Amer: 28 mL/min — ABNORMAL LOW (ref >60)
Glucose, Bld: 94 mg/dL (ref 70–99)
Potassium: 4.5 mmol/L (ref 3.5–5.1)
Sodium: 140 mmol/L (ref 135–145)

## 2019-12-22 LAB — RETICULOCYTES
Immature Retic Fract: 10.7 % (ref 2.3–15.9)
RBC.: 3.05 MIL/uL — ABNORMAL LOW (ref 3.87–5.11)
Retic Count, Absolute: 44.5 10*3/uL (ref 19.0–186.0)
Retic Ct Pct: 1.5 % (ref 0.4–3.1)

## 2019-12-22 LAB — VITAMIN B12: Vitamin B-12: 136 pg/mL — ABNORMAL LOW (ref 180–914)

## 2019-12-22 LAB — IRON AND TIBC
Iron: 17 ug/dL — ABNORMAL LOW (ref 28–170)
Saturation Ratios: 4 % — ABNORMAL LOW (ref 10.4–31.8)
TIBC: 440 ug/dL (ref 250–450)
UIBC: 423 ug/dL

## 2019-12-22 LAB — FERRITIN: Ferritin: 20 ng/mL (ref 11–307)

## 2019-12-22 LAB — FOLATE: Folate: 17.6 ng/mL (ref >5.9)

## 2019-12-22 MED ORDER — AZITHROMYCIN 500 MG PO TABS
500.0000 mg | ORAL_TABLET | Freq: Every day | ORAL | Status: DC
  Administered 2019-12-22: 500 mg via ORAL
  Filled 2019-12-22: qty 1

## 2019-12-22 NOTE — Plan of Care (Signed)
  Problem: Education: Goal: Knowledge of General Education information will improve Description: Including pain rating scale, medication(s)/side effects and non-pharmacologic comfort measures Outcome: Progressing   Problem: Health Behavior/Discharge Planning: Goal: Ability to manage health-related needs will improve Outcome: Progressing   Problem: Clinical Measurements: Goal: Ability to maintain clinical measurements within normal limits will improve Outcome: Progressing Goal: Will remain free from infection Outcome: Progressing Goal: Diagnostic test results will improve Outcome: Progressing Goal: Respiratory complications will improve Outcome: Progressing Goal: Cardiovascular complication will be avoided Outcome: Progressing   Problem: Activity: Goal: Risk for activity intolerance will decrease Outcome: Progressing   Problem: Nutrition: Goal: Adequate nutrition will be maintained Outcome: Progressing   Problem: Coping: Goal: Level of anxiety will decrease Outcome: Progressing   Problem: Elimination: Goal: Will not experience complications related to bowel motility Outcome: Progressing Goal: Will not experience complications related to urinary retention Outcome: Progressing   Problem: Pain Managment: Goal: General experience of comfort will improve Outcome: Progressing   Problem: Safety: Goal: Ability to remain free from injury will improve Outcome: Progressing   Problem: Skin Integrity: Goal: Risk for impaired skin integrity will decrease Outcome: Progressing   Problem: Education: Goal: Ability to demonstrate management of disease process will improve Outcome: Progressing Goal: Ability to verbalize understanding of medication therapies will improve Outcome: Progressing Goal: Individualized Educational Video(s) Outcome: Progressing   Problem: Education: Goal: Ability to demonstrate management of disease process will improve Outcome: Progressing Goal:  Ability to verbalize understanding of medication therapies will improve Outcome: Progressing Goal: Individualized Educational Video(s) Outcome: Progressing   Problem: Activity: Goal: Capacity to carry out activities will improve Outcome: Progressing   Problem: Cardiac: Goal: Ability to achieve and maintain adequate cardiopulmonary perfusion will improve Outcome: Progressing   Problem: Activity: Goal: Ability to tolerate increased activity will improve Outcome: Progressing Goal: Will verbalize the importance of balancing activity with adequate rest periods Outcome: Progressing   Problem: Respiratory: Goal: Ability to maintain a clear airway will improve Outcome: Progressing Goal: Levels of oxygenation will improve Outcome: Progressing Goal: Ability to maintain adequate ventilation will improve Outcome: Progressing

## 2019-12-22 NOTE — Progress Notes (Signed)
PROGRESS NOTE    [name]  VVO:[id number] DOB: April 02, 1946 DOA: 12/21/2019 PCP: [name], MD  Brief Narrative: chronically ill female with history of COPD, chronic diastolic CHF, ongoing tobacco abuse, CKD stage III, chronic pain, anxiety, hypertension, severe malnutrition presented to the emergency room with acute worsening of her dyspnea early this morning. -Patient admits noncompliance with her medications, she reports that her medications are missing, she still smokes. -In the emergency room she was noted to have hypertensive urgency with respiratory distress, started on BiPAP and nitroglycerin drip, also given IV Lasix, chest x-ray noted progressive interstitial groundglass opacities throughout her lungs and dense consolidation in left lower lobe.   Assessment & Plan:   Acute hypoxic and hypercarbic respiratory failure -Improving -Dc BIPAP -Suspect primarily pulmonary edema, also cannot rule out concomitant pneumonia given dense consolidation in the left lower lung, COVID-19 PCR is negative -Clinically improving, continue IV Lasix, ceftriaxone, azithromycin -Urine output of 2 L yesterday -Check SLP evaluation to rule out aspiration -Repeat two-view chest x-ray today -Admits noncompliance with medications, tells me her pills are missing, but she is mostly concerned about her pain medicines only, still smoking  Acute on chronic diastolic CHF -As above, off nitroglycerin drip -Continue IV Lasix today -Noncompliance with diet and diuretics will continue to be an ongoing issue  COPD, ongoing tobacco abuse -Counseled, no wheezing, continue nebs  Stage 4 kidney disease -Baseline creatinine around 1.8-2 -Creatinine lower than recent baseline likely secondary to volume overloaded state,  -monitor with diuresis  Chronic anemia -Baseline hemoglobin around 9-10, now down to 8.2, denies any overt bleeding -Check anemia panel  Chronic pain syndrome and  anxiety -Continue Cymbalta, oxycodone -Resume levorphanol at discharge  Noncompliance Frequent hospitalization -Needs additional home support, monitoring   DVT prophylaxis: Lovenox Code Status: Discussed CODE STATUS again patient wishes to be DNR Family Communication: No family at bedside Disposition Plan: Home in 45 to 72 hours pending improvement in respiratory failure  Consultants:   Procedures:   Antimicrobials:    Subjective: -Breathing better today, does not remember the events of yesterday much, -Requesting her pain medications  Objective: Vitals:   12/22/19 0026 12/22/19 0400 12/22/19 0730 12/22/19 0759  BP: 120/63 126/62 (!) 171/87   Pulse: 67 95    Resp: 18 19 19    Temp: 97.9 F (36.6 C) 98.6 F (37 C) 97.7 F (36.5 C)   TempSrc: Oral Oral Oral   SpO2: 93% 100% 100% 94%  Weight:  48.6 kg    Height:        Intake/Output Summary (Last 24 hours) at 12/22/2019 1012 Last data filed at 12/22/2019 0300 Gross per 24 hour  Intake 1725 ml  Output 1945 ml  Net -220 ml   Filed Weights   12/21/19 0144 12/21/19 0642 12/22/19 0400  Weight: 45.4 kg 50 kg 48.6 kg    Examination:  General exam: Chronically ill cachectic female appears much older than stated age, AAOx3  respiratory system: Poor air movement bilaterally, few basilar rales Cardiovascular system: S1 & S2 heard, RRR  Gastrointestinal system: Abdomen is nondistended, soft and nontender.Normal bowel sounds heard. Central nervous system: Alert and oriented. No focal neurological deficits. Extremities: No edema Skin: No rashes on exposed skin  psychiatry: Mood & affect appropriate.     Data Reviewed:   CBC: Recent Labs  Lab 12/21/19 0139 12/21/19 0147 12/21/19 0440 12/21/19 1020 12/22/19 0557  WBC  --  18.8*  --  12.7* 7.9  NEUTROABS  --  11.7*  --   --   --  HGB 10.9* 11.0* 8.5* 10.0* 8.2*  HCT 32.0* 37.6 25.0* 35.3* 27.8*  MCV  --  96.2  --  94.6 92.1  PLT  --  446*  --  320 161    Basic Metabolic Panel: Recent Labs  Lab 12/21/19 0139 12/21/19 0147 12/21/19 0440 12/21/19 1020 12/22/19 0557  NA 141 142 144 142 140  K 5.0 5.2* 4.6 5.0 4.5  CL  --  115*  --  110 109  CO2  --  13*  --  14* 21*  GLUCOSE  --  247*  --  146* 94  BUN  --  28*  --  29* 30*  CREATININE  --  1.64*  --  1.57* 1.74*  CALCIUM  --  8.0*  --  8.1* 8.3*   GFR: Estimated Creatinine Clearance: 21.8 mL/min (A) (by C-G formula based on SCr of 1.74 mg/dL (H)). Liver Function Tests: Recent Labs  Lab 12/21/19 0147  AST 34  ALT 12  ALKPHOS 135*  BILITOT 0.7  PROT 6.5  ALBUMIN 3.4*   No results for input(s): LIPASE, AMYLASE in the last 168 hours. No results for input(s): AMMONIA in the last 168 hours. Coagulation Profile: No results for input(s): INR, PROTIME in the last 168 hours. Cardiac Enzymes: No results for input(s): CKTOTAL, CKMB, CKMBINDEX, TROPONINI in the last 168 hours. BNP (last 3 results) No results for input(s): PROBNP in the last 8760 hours. HbA1C: No results for input(s): HGBA1C in the last 72 hours. CBG: No results for input(s): GLUCAP in the last 168 hours. Lipid Profile: No results for input(s): CHOL, HDL, LDLCALC, TRIG, CHOLHDL, LDLDIRECT in the last 72 hours. Thyroid Function Tests: No results for input(s): TSH, T4TOTAL, FREET4, T3FREE, THYROIDAB in the last 72 hours. Anemia Panel: No results for input(s): VITAMINB12, FOLATE, FERRITIN, TIBC, IRON, RETICCTPCT in the last 72 hours. Urine analysis:    Component Value Date/Time   COLORURINE STRAW (A) 11/04/2019 0911   APPEARANCEUR CLEAR 11/04/2019 0911   LABSPEC 1.010 11/04/2019 0911   PHURINE 5.0 11/04/2019 0911   GLUCOSEU NEGATIVE 11/04/2019 0911   HGBUR NEGATIVE 11/04/2019 0911   BILIRUBINUR NEGATIVE 11/04/2019 0911   KETONESUR NEGATIVE 11/04/2019 0911   PROTEINUR NEGATIVE 11/04/2019 0911   UROBILINOGEN 0.2 07/28/2015 2223   NITRITE NEGATIVE 11/04/2019 0911   LEUKOCYTESUR MODERATE (A) 11/04/2019 0911    Sepsis Labs: @LABRCNTIP (procalcitonin:4,lacticidven:4)  ) Recent Results (from the past 240 hour(s))  Blood culture (routine x 2)     Status: None (Preliminary result)   Collection Time: 12/21/19  1:48 AM   Specimen: BLOOD LEFT FOREARM  Result Value Ref Range Status   Specimen Description BLOOD LEFT FOREARM  Final   Special Requests   Final    BOTTLES DRAWN AEROBIC AND ANAEROBIC Blood Culture adequate volume   Culture   Final    NO GROWTH 1 DAY Performed at [facility], [place] [address] [place], [place] [address]    Report Status PENDING  Incomplete  Blood culture (routine x 2)     Status: None (Preliminary result)   Collection Time: 12/21/19  1:48 AM   Specimen: BLOOD  Result Value Ref Range Status   Specimen Description BLOOD RIGHT ANTECUBITAL  Final   Special Requests   Final    BOTTLES DRAWN AEROBIC AND ANAEROBIC Blood Culture adequate volume   Culture   Final    NO GROWTH 1 DAY Performed at [facility], [place] [address], [place], [place] [address]  Report Status PENDING  Incomplete  SARS CORONAVIRUS 2 (TAT 6-24 HRS) Nasopharyngeal Nasopharyngeal Swab     Status: None   Collection Time: 12/21/19  4:47 AM   Specimen: Nasopharyngeal Swab  Result Value Ref Range Status   SARS Coronavirus 2 NEGATIVE NEGATIVE Final    Comment: (NOTE) SARS-CoV-2 target nucleic acids are NOT DETECTED. The SARS-CoV-2 RNA is generally detectable in upper and lower respiratory specimens during the acute phase of infection. Negative results do not preclude SARS-CoV-2 infection, do not rule out co-infections with other pathogens, and should not be used as the sole basis for treatment or other patient management decisions. Negative results must be combined with clinical observations, patient history, and epidemiological information. The expected result is Negative. Fact Sheet for Patients: SugarRoll.be Fact Sheet for Healthcare  Providers: https://www.woods-mathews.com/ This test is not yet approved or cleared by the [place] FDA and  has been authorized for detection and/or diagnosis of SARS-CoV-2 by FDA under an Emergency Use Authorization (EUA). This EUA will remain  in effect (meaning this test can be used) for the duration of the COVID-19 declaration under Section 56 4(b)(1) of the Act, 21 U.S.C. section 360bbb-3(b)(1), unless the authorization is terminated or revoked sooner. Performed at [facility], [place] [address], [place], [place] [address]          Radiology Studies: DG Chest Portable 1 View  Result Date: 12/21/2019 CLINICAL DATA:  Short of breath, respiratory distress EXAM: PORTABLE CHEST 1 VIEW COMPARISON:  11/19/2019 FINDINGS: Single frontal view of the chest demonstrates borderline size of the cardiac silhouette, likely due to portable AP technique. Slight decrease in left pleural effusion since prior study. There is increasing left lower lobe consolidation primarily in the retrocardiac region. There is been progression of the diffuse interstitial and ground-glass opacity seen previously. No pneumothorax. No acute bony abnormalities. IMPRESSION: 1. Progressive interstitial and ground-glass opacities throughout the lungs. This could reflect worsening volume status, though superimposed atypical pneumonia cannot be excluded. 2. Dense consolidation left lower lobe, with associated small residual left pleural effusion. Electronically Signed   By: Randa Ngo M.D.   On: 12/21/2019 01:40        Scheduled Meds: . allopurinol  300 mg Oral Daily  . amLODipine  10 mg Oral Daily  . atorvastatin  20 mg Oral QHS  . azithromycin  500 mg Oral Daily  . DULoxetine  60 mg Oral BID  . enoxaparin (LOVENOX) injection  20 mg Subcutaneous Q24H  . furosemide  40 mg Intravenous Daily  . metoprolol succinate  50 mg Oral Daily  . multivitamin with minerals  1 tablet Oral Daily  . nicotine   14 mg Transdermal Daily  . pantoprazole  40 mg Oral BID  . sodium bicarbonate  650 mg Oral BID  . sodium chloride flush  3 mL Intravenous Q12H  . umeclidinium bromide  1 puff Inhalation Daily   Continuous Infusions: . sodium chloride    . cefTRIAXone (ROCEPHIN)  IV 2 g (12/21/19 1355)     LOS: 1 day    Time spent: 20min  Domenic Polite, MD [facility]  12/22/2019, 10:12 AM

## 2019-12-22 NOTE — Evaluation (Signed)
Physical Therapy Evaluation Patient Details Name: [name] MRN: [id number] DOB: 05/10/1946 Today's Date: 12/22/2019   History of Present Illness  Pt is a 74 y/o female admitted secondary to increased dyspnea. Likely from flash pulmonary edema. Pt also with hypertensive urgency. PMH includes PVD, dCHF, CKD, fibromyalgia, COPD, tobacco use, an dHTN.   Clinical Impression  Pt admitted secondary to problem above with deficits below. Pt requiring min A for short distance gait. Pt with increased fatigue, so mobility limited. Oxygen sats at 92-96% on 3L. Educated about using RW at home to increase independence and safety. Will continue to follow acutely to maximize functional mobility independence and safety.     Follow Up Recommendations Home health PT    Equipment Recommendations  None recommended by PT(states she has RW at home)    Recommendations for Other Services       Precautions / Restrictions Precautions Precautions: Fall Restrictions Weight Bearing Restrictions: No      Mobility  Bed Mobility               General bed mobility comments: Sitting EOB upon entry   Transfers Overall transfer level: Needs assistance Equipment used: 1 person hand held assist Transfers: Sit to/from Stand Sit to Stand: Min assist         General transfer comment: Min A for steadying assist.   Ambulation/Gait Ambulation/Gait assistance: Min assist Gait Distance (Feet): 20 Feet Assistive device: 1 person hand held assist Gait Pattern/deviations: Step-through pattern;Decreased stride length Gait velocity: Decreased   General Gait Details: Slow, mildly unsteady gait. Holding to PT's hand and also reaching for furniture. Pt easily fatigued, so further mobility deferred. Oxygen sats at 92-96% on 3L.   Stairs            Wheelchair Mobility    Modified Rankin (Stroke Patients Only)       Balance Overall balance assessment: Needs assistance Sitting-balance support: No  upper extremity supported;Feet supported Sitting balance-Leahy Scale: Fair     Standing balance support: Single extremity supported;During functional activity Standing balance-Leahy Scale: Poor Standing balance comment: reliant on UE support                              Pertinent Vitals/Pain Pain Assessment: No/denies pain    Home Living Family/patient expects to be discharged to:: Private residence Living Arrangements: Non-relatives/Friends Available Help at Discharge: Available PRN/intermittently Type of Home: House Home Access: Stairs to enter Entrance Stairs-Rails: Right Entrance Stairs-Number of Steps: 2 Home Layout: One level Home Equipment: Cane - single point;Walker - 2 wheels;Shower seat;Grab bars - tub/shower;Walker - 4 wheels      Prior Function Level of Independence: Independent with assistive device(s)         Comments: Furniture walker at home; uses rollator for community. Does not drive.     Hand Dominance        Extremity/Trunk Assessment   Upper Extremity Assessment Upper Extremity Assessment: Defer to OT evaluation    Lower Extremity Assessment Lower Extremity Assessment: Generalized weakness    Cervical / Trunk Assessment Cervical / Trunk Assessment: Kyphotic  Communication   Communication: HOH  Cognition Arousal/Alertness: Awake/alert Behavior During Therapy: WFL for tasks assessed/performed Overall Cognitive Status: Within Functional Limits for tasks assessed  General Comments General comments (skin integrity, edema, etc.): Educated about using RW at home inside of house to increase safety.     Exercises     Assessment/Plan    PT Assessment Patient needs continued PT services  PT Problem List Decreased strength;Decreased balance;Decreased activity tolerance;Decreased mobility;Decreased knowledge of use of DME;Decreased knowledge of precautions;Cardiopulmonary status  limiting activity       PT Treatment Interventions Gait training;DME instruction;Stair training;Therapeutic activities;Functional mobility training;Therapeutic exercise;Balance training;Patient/family education    PT Goals (Current goals can be found in the Care Plan section)  Acute Rehab PT Goals Patient Stated Goal: to go home PT Goal Formulation: With patient Time For Goal Achievement: 01/05/20 Potential to Achieve Goals: Good    Frequency Min 3X/week   Barriers to discharge        Co-evaluation               AM-PAC PT "6 Clicks" Mobility  Outcome Measure Help needed turning from your back to your side while in a flat bed without using bedrails?: None Help needed moving from lying on your back to sitting on the side of a flat bed without using bedrails?: None Help needed moving to and from a bed to a chair (including a wheelchair)?: A Little Help needed standing up from a chair using your arms (e.g., wheelchair or bedside chair)?: A Little Help needed to walk in hospital room?: A Little Help needed climbing 3-5 steps with a railing? : A Lot 6 Click Score: 19    End of Session Equipment Utilized During Treatment: Gait belt;Oxygen Activity Tolerance: Patient limited by fatigue Patient left: in bed;with call bell/phone within reach(sitting EOB ) Nurse Communication: Mobility status PT Visit Diagnosis: Unsteadiness on feet (R26.81);Difficulty in walking, not elsewhere classified (R26.2)    Time: 6286-3817 PT Time Calculation (min) (ACUTE ONLY): 14 min   Charges:   PT Evaluation $PT Eval Moderate Complexity: 1 Mod          Reuel Derby, PT, DPT  Acute Rehabilitation Services  Pager: [phone] Office: [phone]   Rudean Hitt 12/22/2019, 10:58 AM

## 2019-12-23 ENCOUNTER — Encounter (HOSPITAL_COMMUNITY): Payer: Self-pay | Admitting: Internal Medicine

## 2019-12-23 DIAGNOSIS — J9602 Acute respiratory failure with hypercapnia: Secondary | ICD-10-CM

## 2019-12-23 LAB — CBC
HCT: 27.2 % — ABNORMAL LOW (ref 36.0–46.0)
Hemoglobin: 8 g/dL — ABNORMAL LOW (ref 12.0–15.0)
MCH: 27.2 pg (ref 26.0–34.0)
MCHC: 29.4 g/dL — ABNORMAL LOW (ref 30.0–36.0)
MCV: 92.5 fL (ref 80.0–100.0)
Platelets: 248 10*3/uL (ref 150–400)
RBC: 2.94 MIL/uL — ABNORMAL LOW (ref 3.87–5.11)
RDW: 16.6 % — ABNORMAL HIGH (ref 11.5–15.5)
WBC: 5.9 10*3/uL (ref 4.0–10.5)
nRBC: 0 % (ref 0.0–0.2)

## 2019-12-23 LAB — BASIC METABOLIC PANEL
Anion gap: 10 (ref 5–15)
BUN: 33 mg/dL — ABNORMAL HIGH (ref 8–23)
CO2: 22 mmol/L (ref 22–32)
Calcium: 8.2 mg/dL — ABNORMAL LOW (ref 8.9–10.3)
Chloride: 107 mmol/L (ref 98–111)
Creatinine, Ser: 1.66 mg/dL — ABNORMAL HIGH (ref 0.44–1.00)
GFR calc Af Amer: 35 mL/min — ABNORMAL LOW (ref >60)
GFR calc non Af Amer: 30 mL/min — ABNORMAL LOW (ref >60)
Glucose, Bld: 86 mg/dL (ref 70–99)
Potassium: 4.3 mmol/L (ref 3.5–5.1)
Sodium: 139 mmol/L (ref 135–145)

## 2019-12-23 LAB — PROCALCITONIN: Procalcitonin: 2.55 ng/mL

## 2019-12-23 MED ORDER — SODIUM CHLORIDE 0.9 % IV SOLN
1.0000 g | INTRAVENOUS | Status: DC
  Administered 2019-12-23 – 2019-12-24 (×2): 1 g via INTRAVENOUS
  Filled 2019-12-23 (×2): qty 10

## 2019-12-23 MED ORDER — ENSURE ENLIVE PO LIQD: 237.0000 mL | Freq: Two times a day (BID) | ORAL | Status: DC

## 2019-12-23 MED ORDER — FUROSEMIDE 40 MG PO TABS
40.0000 mg | ORAL_TABLET | Freq: Every day | ORAL | Status: DC
  Administered 2019-12-23 – 2019-12-25 (×3): 40 mg via ORAL
  Filled 2019-12-23 (×3): qty 1

## 2019-12-23 MED ORDER — AZITHROMYCIN 500 MG PO TABS
500.0000 mg | ORAL_TABLET | ORAL | Status: AC
  Administered 2019-12-23 – 2019-12-25 (×3): 500 mg via ORAL
  Filled 2019-12-23 (×3): qty 1

## 2019-12-23 NOTE — Plan of Care (Signed)

## 2019-12-23 NOTE — Progress Notes (Signed)
[facility] PROGRESS NOTE    Progress Note  [name]  KGM:[id number] DOB: 1946/01/30 DOA: 12/21/2019 PCP: [name], MD     Brief Narrative:   [name] is an 74 y.o. female past medical history of COPD, chronic diastolic heart failure ongoing tobacco abuse, chronic kidney disease stage III anxiety severe protein malnutrition comes into the ED for acute shortness of breath, she has been noncompliant with her medication in the ED she was noted to be in hypertensive emergency respiratory distress started on BiPAP IV nitroglycerin drip chest x-ray showed probable pulmonary edema  Assessment/Plan:   Acute respiratory failure with hypercarbia due to acute on chronic diastolic heart failure versus community-acquired pneumonia: Originally started on BiPAP IV nitroglycerin and Lasix. She has been treated empirically with both IV Lasix and IV antibiotics.  Her estimated dry weight is around her baseline. She is only negative about 300 cc. Consult physical therapy, physical therapy evaluation is pending.  History of COPD: No wheezing continue nebs.  Chronic kidney disease stage IV: With a baseline creatinine of 1.82. Her creatinine is lower than baseline likely due to volume overload currently on IV Lasix.  Chronic normocytic anemia: With elevated RDW hemoglobin at baseline MCV of 92. hemoglobin has remained stable continue to follow.  Chronic pain syndrome and anxiety: Continue Cymbalta and oxycodone.     DVT prophylaxis: lovenox Family Communication:*none Disposition Plan/Barrier to D/C: Once she is off oxygen she can probably be discharged home.  Code Status:     Code Status Orders  (From admission, onward)         Start     Ordered   12/22/19 0824  Do not attempt resuscitation (DNR)  Continuous    Question Answer Comment  In the event of cardiac or respiratory ARREST Do not call a code blue   In the event of cardiac or respiratory ARREST  Do not perform Intubation, CPR, defibrillation or ACLS   In the event of cardiac or respiratory ARREST Use medication by any route, position, wound care, and other measures to relive pain and suffering. May use oxygen, suction and manual treatment of airway obstruction as needed for comfort.   Comments Patient is DNR, but is willing to have intubation if indicated.      12/22/19 0824        Code Status History    Date Active Date Inactive Code Status Order ID Comments User Context   12/21/2019 0435 12/22/2019 0824 Partial Code [id number]  Etta Quill, DO ED   12/21/2019 0429 12/21/2019 0435 Partial Code [id number]  Etta Quill, DO ED   12/21/2019 0138 12/21/2019 0429 DNR [id number]  Merrily Pew, MD ED   11/20/2019 0547 11/24/2019 1940 DNR [id number]  [name], MD ED   11/04/2019 0944 11/09/2019 2133 DNR [id number]  [name], MD ED   08/02/2018 0001 08/04/2018 1634 Full Code [id number]  [name], MD ED   05/14/2018 2131 05/17/2018 2309 Full Code [id number]  [name], MD ED   10/10/2017 0130 10/16/2017 1644 Full Code [id number]  [name], MD ED   01/25/2016 2021 02/02/2016 1643 Full Code [id number]  [name], MD Inpatient   07/29/2015 0310 07/30/2015 1738 Full Code [id number]  [name], MD Inpatient   11/23/2011 1823 11/25/2011 1339 Full Code [id number]  [name], MD Inpatient   Advance Care Planning Activity        IV Access:    Peripheral IV  Procedures and diagnostic studies:   DG Chest 2 View  Result Date: 12/22/2019 CLINICAL DATA:  Hypoxia. Near syncope last night. EXAM: CHEST - 2 VIEW COMPARISON:  Chest x-rays dated 12/21/2019, 11/19/2019 and 11/06/2019 FINDINGS: There has been a marked improvement in the extensive bilateral pulmonary edema with almost complete resolution. There is a persistent area of consolidation/atelectasis at the left lung base posterior medially. There is a small left pleural effusion which appears diminished. Tiny  right effusion. Heart size and pulmonary vascularity are now within normal limits. No acute bone abnormality. IMPRESSION: 1. Almost complete resolution of the pulmonary edema. 2. Persistent atelectasis/consolidation at the left lung base. 3. Small effusions. Electronically Signed   By: Lorriane Shire M.D.   On: 12/22/2019 14:28     Medical Consultants:    None.  Anti-Infectives:   Rocephin and azithromycin  Subjective:    [name] she relates her breathing is better than yesterday, still mildly anorexic, she relates she would like to get up and walk.  Objective:    Vitals:   12/22/19 1600 12/22/19 1900 12/23/19 0030 12/23/19 0443  BP: 131/66 129/62 (!) 130/57   Pulse: 66 66 61 68  Resp: 20 20 14    Temp: 98.4 F (36.9 C) 98.5 F (36.9 C) 98.6 F (37 C) 98.5 F (36.9 C)  TempSrc: Oral Oral Oral Oral  SpO2: 100% 98% 100% 99%  Weight:    47.6 kg  Height:       SpO2: 99 % O2 Flow Rate (L/min): 6 L/min FiO2 (%): 40 %   Intake/Output Summary (Last 24 hours) at 12/23/2019 0647 Last data filed at 12/23/2019 0221 Gross per 24 hour  Intake 1073 ml  Output 1450 ml  Net -377 ml   Filed Weights   12/21/19 0642 12/22/19 0400 12/23/19 0443  Weight: 50 kg 48.6 kg 47.6 kg    Exam: General exam: In no acute distress. Respiratory system: Good air movement and crackles bilaterally more prominent on the left. Cardiovascular system: S1 & S2 heard, RRR. No JVD. Gastrointestinal system: Abdomen is nondistended, soft and nontender.  Extremities: No pedal edema. Skin: No rashes, lesions or ulcers Psychiatry: Judgement and insight appear normal. Mood & affect appropriate.    Data Reviewed:    Labs: Basic Metabolic Panel: Recent Labs  Lab 12/21/19 0147 12/21/19 0147 12/21/19 0440 12/21/19 0440 12/21/19 1020 12/21/19 1020 12/22/19 0557 12/23/19 0428  NA 142  --  144  --  142  --  140 139  K 5.2*   < > 4.6   < > 5.0   < > 4.5 4.3  CL 115*  --   --   --  110  --   109 107  CO2 13*  --   --   --  14*  --  21* 22  GLUCOSE 247*  --   --   --  146*  --  94 86  BUN 28*  --   --   --  29*  --  30* 33*  CREATININE 1.64*  --   --   --  1.57*  --  1.74* 1.66*  CALCIUM 8.0*  --   --   --  8.1*  --  8.3* 8.2*   < > = values in this interval not displayed.   GFR Estimated Creatinine Clearance: 22.3 mL/min (A) (by C-G formula based on SCr of 1.66 mg/dL (H)). Liver Function Tests: Recent Labs  Lab 12/21/19 0147  AST 34  ALT 12  ALKPHOS 135*  BILITOT 0.7  PROT 6.5  ALBUMIN 3.4*   No results for input(s): LIPASE, AMYLASE in the last 168 hours. No results for input(s): AMMONIA in the last 168 hours. Coagulation profile No results for input(s): INR, PROTIME in the last 168 hours. COVID-19 Labs  Recent Labs    12/22/19 1102  FERRITIN 20    Lab Results  Component Value Date   SARSCOV2NAA NEGATIVE 12/21/2019   [place] NEGATIVE 11/19/2019   [place] NEGATIVE 11/04/2019    CBC: Recent Labs  Lab 12/21/19 0147 12/21/19 0440 12/21/19 1020 12/22/19 0557 12/23/19 0428  WBC 18.8*  --  12.7* 7.9 5.9  NEUTROABS 11.7*  --   --   --   --   HGB 11.0* 8.5* 10.0* 8.2* 8.0*  HCT 37.6 25.0* 35.3* 27.8* 27.2*  MCV 96.2  --  94.6 92.1 92.5  PLT 446*  --  320 269 248   Cardiac Enzymes: No results for input(s): CKTOTAL, CKMB, CKMBINDEX, TROPONINI in the last 168 hours. BNP (last 3 results) No results for input(s): PROBNP in the last 8760 hours. CBG: No results for input(s): GLUCAP in the last 168 hours. D-Dimer: No results for input(s): DDIMER in the last 72 hours. Hgb A1c: No results for input(s): HGBA1C in the last 72 hours. Lipid Profile: No results for input(s): CHOL, HDL, LDLCALC, TRIG, CHOLHDL, LDLDIRECT in the last 72 hours. Thyroid function studies: No results for input(s): TSH, T4TOTAL, T3FREE, THYROIDAB in the last 72 hours.  Invalid input(s): FREET3 Anemia work up: Recent Labs    12/22/19 1102  VITAMINB12 136*  FOLATE 17.6    FERRITIN 20  TIBC 440  IRON 17*  RETICCTPCT 1.5   Sepsis Labs: Recent Labs  Lab 12/21/19 0147 12/21/19 0159 12/21/19 0424 12/21/19 1020 12/22/19 0557 12/23/19 0428  WBC 18.8*  --   --  12.7* 7.9 5.9  LATICACIDVEN  --  2.1* 0.9  --   --   --    Microbiology Recent Results (from the past 240 hour(s))  Blood culture (routine x 2)     Status: None (Preliminary result)   Collection Time: 12/21/19  1:48 AM   Specimen: BLOOD LEFT FOREARM  Result Value Ref Range Status   Specimen Description BLOOD LEFT FOREARM  Final   Special Requests   Final    BOTTLES DRAWN AEROBIC AND ANAEROBIC Blood Culture adequate volume   Culture   Final    NO GROWTH 1 DAY Performed at [facility], [place] [address], [place], [place] [address]    Report Status PENDING  Incomplete  Blood culture (routine x 2)     Status: None (Preliminary result)   Collection Time: 12/21/19  1:48 AM   Specimen: BLOOD  Result Value Ref Range Status   Specimen Description BLOOD RIGHT ANTECUBITAL  Final   Special Requests   Final    BOTTLES DRAWN AEROBIC AND ANAEROBIC Blood Culture adequate volume   Culture   Final    NO GROWTH 1 DAY Performed at [facility], [place] [address], [place], [place] [address]    Report Status PENDING  Incomplete  SARS CORONAVIRUS 2 (TAT 6-24 HRS) Nasopharyngeal Nasopharyngeal Swab     Status: None   Collection Time: 12/21/19  4:47 AM   Specimen: Nasopharyngeal Swab  Result Value Ref Range Status   SARS Coronavirus 2 NEGATIVE NEGATIVE Final    Comment: (NOTE) SARS-CoV-2 target nucleic acids are NOT DETECTED. The SARS-CoV-2 RNA is  generally detectable in upper and lower respiratory specimens during the acute phase of infection. Negative results do not preclude SARS-CoV-2 infection, do not rule out co-infections with other pathogens, and should not be used as the sole basis for treatment or other patient management decisions. Negative results must be combined with clinical  observations, patient history, and epidemiological information. The expected result is Negative. Fact Sheet for Patients: SugarRoll.be Fact Sheet for Healthcare Providers: https://www.woods-mathews.com/ This test is not yet approved or cleared by the [place] FDA and  has been authorized for detection and/or diagnosis of SARS-CoV-2 by FDA under an Emergency Use Authorization (EUA). This EUA will remain  in effect (meaning this test can be used) for the duration of the COVID-19 declaration under Section 56 4(b)(1) of the Act, 21 U.S.C. section 360bbb-3(b)(1), unless the authorization is terminated or revoked sooner. Performed at [facility], [place] [address], [place], [place] [address]      Medications:    allopurinol  300 mg Oral Daily   amLODipine  10 mg Oral Daily   atorvastatin  20 mg Oral QHS   azithromycin  500 mg Oral Daily   DULoxetine  60 mg Oral BID   enoxaparin (LOVENOX) injection  20 mg Subcutaneous Q24H   furosemide  40 mg Intravenous Daily   metoprolol succinate  50 mg Oral Daily   multivitamin with minerals  1 tablet Oral Daily   nicotine  14 mg Transdermal Daily   pantoprazole  40 mg Oral BID   sodium chloride flush  3 mL Intravenous Q12H   umeclidinium bromide  1 puff Inhalation Daily   Continuous Infusions:  sodium chloride 10 mL/hr at 12/22/19 1700   cefTRIAXone (ROCEPHIN)  IV 200 mL/hr at 12/22/19 1700      LOS: 2 days   Charlynne Cousins  [facility]  12/23/2019, 6:47 AM

## 2019-12-23 NOTE — Progress Notes (Signed)
Initial Nutrition Assessment  DOCUMENTATION CODES:   Non-severe (moderate) malnutrition in context of chronic illness, Underweight  INTERVENTION:   -Ensure Enlive po BID, each supplement provides 350 kcal and 20 grams of protein -MVI with minerals daily  NUTRITION DIAGNOSIS:   Moderate Malnutrition related to chronic illness(CHF) as evidenced by energy intake < 75% for > or equal to 1 month, mild fat depletion, moderate fat depletion, mild muscle depletion, moderate muscle depletion.  GOAL:   Patient will meet greater than or equal to 90% of their needs  MONITOR:   PO intake, Supplement acceptance, Diet advancement, Labs, Weight trends, Skin, I & O's  REASON FOR ASSESSMENT:   Other (Comment)    ASSESSMENT:   [name] is an 74 y.o. female past medical history of COPD, chronic diastolic heart failure ongoing tobacco abuse, chronic kidney disease stage III anxiety severe protein malnutrition comes into the ED for acute shortness of breath, she has been noncompliant with her medication in the ED she was noted to be in hypertensive emergency respiratory distress started on BiPAP IV nitroglycerin drip chest x-ray showed probable pulmonary edema  Pt admitted with flash pulmonary edema, hypertensive urgency, and acute on chronic diastolic CHF.   Reviewed I/O's: -377 ml x 24 hours and -347 ml since admission  UOP: 1.5 L x 24 hours  Per MD notes, pt not taking medications at home.   Spoke with pt, who was sitting up in bed, eating lunch at time of visit. She reports poor appetite at home- she shares she typically only consumes about one meal per day. Per her report, she lives with a man who is a Associate Professor, but often does not cook. She often does not have the desire to eat as well. Attempted to obtain diet history from pt, however, was very tangential when asked.   Per meal completion records, pt consumed 50% of meal. Pt was consuming lunch at time of visit. She was  attempting to eat carrots and complaining that she did not have salt on her tray. RD assisted pt with adding margarine and salt-free seasoning to help with taste.   Pt reports wt typically fluctuates at home. She reports UBW is around 108#, usually ranging 3# above or below. Pt with mild to moderate edema, which is likely masking further weight loss and fat and muscle depletions.    Labs reviewed.   NUTRITION - FOCUSED PHYSICAL EXAM:    Most Recent Value  Orbital Region  Mild depletion  Upper Arm Region  Mild depletion  Thoracic and Lumbar Region  No depletion  Buccal Region  No depletion  Temple Region  Moderate depletion  Clavicle Bone Region  Moderate depletion  Clavicle and Acromion Bone Region  Moderate depletion  Scapular Bone Region  Moderate depletion  Dorsal Hand  Mild depletion  Patellar Region  Mild depletion  Anterior Thigh Region  Mild depletion  Posterior Calf Region  Mild depletion  Edema (RD Assessment)  Mild  Hair  Reviewed  Eyes  Reviewed  Mouth  Reviewed  Skin  Reviewed  Nails  Reviewed       Diet Order:   Diet Order            Diet Heart Room service appropriate? Yes; Fluid consistency: Thin  Diet effective now              EDUCATION NEEDS:   Education needs have been addressed  Skin:  Skin Assessment: Reviewed RN Assessment  Last BM:  12/20/19  Height:   Ht Readings from Last 1 Encounters:  12/21/19 5\' 4"  (1.626 m)    Weight:   Wt Readings from Last 1 Encounters:  12/23/19 47.6 kg    Ideal Body Weight:  54.5 kg  BMI:  Body mass index is 18.01 kg/m.  Estimated Nutritional Needs:   Kcal:  1700-1900  Protein:  80-95 grams  Fluid:  > 1.7 L    Loistine Chance, RD, LDN, [place] Registered Dietitian II Certified Diabetes Care and Education Specialist Please refer to [facility] for RD and/or RD on-call/weekend/after hours pager

## 2019-12-23 NOTE — Evaluation (Signed)
Occupational Therapy Evaluation Patient Details Name: [name] MRN: [id number] DOB: 02/06/1946 Today's Date: 12/23/2019    History of Present Illness Pt is a 74 y/o female admitted secondary to increased dyspnea. Likely from flash pulmonary edema. Pt also with hypertensive urgency. PMH includes PVD, dCHF, CKD, fibromyalgia, COPD, tobacco use, an dHTN.    Clinical Impression   Pt with decline in function and safety with ADLs and ADL mobility with impaired strength, balance and endurance. Pt lives at home with multiple family members. Pt states that uses a cane and RW for mobility and was independent with ADLs/selfcare and meal prep. Pt currently requires sup with bed mobility to sit EOB, set up/sup with UB ADLs, min guard A with LB ADLs, mi guard A with toileting and min guard A with functional mobility HHA. Pt would benefit from acute OT services to address impairments to maximize level of function and safety    Follow Up Recommendations  No OT follow up;Supervision - Intermittent    Equipment Recommendations  Tub/shower seat    Recommendations for Other Services       Precautions / Restrictions Precautions Precautions: Fall Restrictions Weight Bearing Restrictions: No      Mobility Bed Mobility   Bed Mobility: Supine to Sit;Sit to Supine     Supine to sit: Supervision Sit to supine: Supervision      Transfers Overall transfer level: Needs assistance Equipment used: 1 person hand held assist Transfers: Sit to/from Stand Sit to Stand: Min guard              Balance Overall balance assessment: Needs assistance Sitting-balance support: No upper extremity supported;Feet supported Sitting balance-Leahy Scale: Fair     Standing balance support: Single extremity supported;During functional activity Standing balance-Leahy Scale: Poor                             ADL either performed or assessed with clinical judgement   ADL Overall ADL's : Needs  assistance/impaired Eating/Feeding: Independent;Sitting   Grooming: Wash/dry hands;Wash/dry face;Standing;Min guard   Upper Body Bathing: Set up;Supervision/ safety;Sitting   Lower Body Bathing: Min guard   Upper Body Dressing : Set up;Supervision/safety;Sitting   Lower Body Dressing: Min guard   Toilet Transfer: Min guard;Ambulation;BSC   Toileting- Water quality scientist and Hygiene: Min guard;Sit to/from stand       Functional mobility during ADLs: Min guard;Rolling walker       Vision Baseline Vision/History: Wears glasses Wears Glasses: Reading only Patient Visual Report: No change from baseline       Perception     Praxis      Pertinent Vitals/Pain Pain Assessment: Faces Faces Pain Scale: Hurts little more Pain Location: back (pt states this is chronic) Pain Descriptors / Indicators: Aching Pain Intervention(s): Monitored during session;Repositioned     Hand Dominance Right   Extremity/Trunk Assessment Upper Extremity Assessment Upper Extremity Assessment: Generalized weakness   Lower Extremity Assessment Lower Extremity Assessment: Defer to PT evaluation   Cervical / Trunk Assessment Cervical / Trunk Assessment: Kyphotic   Communication Communication Communication: HOH   Cognition Arousal/Alertness: Awake/alert Behavior During Therapy: WFL for tasks assessed/performed Overall Cognitive Status: Within Functional Limits for tasks assessed                                     General Comments       Exercises  Shoulder Instructions      Home Living Family/patient expects to be discharged to:: Private residence Living Arrangements: Non-relatives/Friends Available Help at Discharge: Available PRN/intermittently Type of Home: House Home Access: Stairs to enter [employer] of Steps: 2 Entrance Stairs-Rails: Right Home Layout: One level     Bathroom Shower/Tub: Teacher, early years/pre: Standard      Home Equipment: Cane - single point;Walker - 2 wheels;Shower seat;Grab bars - tub/shower;Walker - 4 wheels   Additional Comments: shower seat is broken      Prior Functioning/Environment Level of Independence: Independent with assistive device(s)                 OT Problem List: Decreased strength;Impaired balance (sitting and/or standing);Decreased activity tolerance      OT Treatment/Interventions: Self-care/ADL training;DME and/or AE instruction;Therapeutic activities;Therapeutic exercise;Patient/family education    OT Goals(Current goals can be found in the care plan section) Acute Rehab OT Goals Patient Stated Goal: to go home OT Goal Formulation: With patient Time For Goal Achievement: 01/06/20 Potential to Achieve Goals: Good ADL Goals Pt Will Perform Grooming: with supervision;with set-up;with modified independence;standing Pt Will Perform Lower Body Bathing: with supervision;with set-up;with modified independence;sit to/from stand Pt Will Perform Lower Body Dressing: with supervision;with set-up;with modified independence;sit to/from stand Pt Will Transfer to Toilet: with supervision;with modified independence;ambulating Pt Will Perform Toileting - Clothing Manipulation and hygiene: with supervision;with modified independence;sit to/from stand  OT Frequency: Min 2X/week   Barriers to D/C:            Co-evaluation              AM-PAC OT "6 Clicks" Daily Activity     Outcome Measure Help from another person eating meals?: None Help from another person taking care of personal grooming?: A Little Help from another person toileting, which includes using toliet, bedpan, or urinal?: A Little Help from another person bathing (including washing, rinsing, drying)?: A Little Help from another person to put on and taking off regular upper body clothing?: None Help from another person to put on and taking off regular lower body clothing?: A Little 6 Click Score:  20   End of Session Equipment Utilized During Treatment: Gait belt;Other (comment)(BSC)  Activity Tolerance: Patient limited by fatigue Patient left: in bed;with call bell/phone within reach  OT Visit Diagnosis: Unsteadiness on feet (R26.81);Muscle weakness (generalized) (M62.81)                Time: 0938-1829 OT Time Calculation (min): 20 min Charges:  OT Evaluation $OT Eval Moderate Complexity: 1 Mod    Britt Bottom 12/23/2019, 12:24 PM

## 2019-12-24 DIAGNOSIS — E440 Moderate protein-calorie malnutrition: Secondary | ICD-10-CM | POA: Insufficient documentation

## 2019-12-24 LAB — BASIC METABOLIC PANEL
Anion gap: 12 (ref 5–15)
BUN: 34 mg/dL — ABNORMAL HIGH (ref 8–23)
CO2: 23 mmol/L (ref 22–32)
Calcium: 8.4 mg/dL — ABNORMAL LOW (ref 8.9–10.3)
Chloride: 104 mmol/L (ref 98–111)
Creatinine, Ser: 1.68 mg/dL — ABNORMAL HIGH (ref 0.44–1.00)
GFR calc Af Amer: 34 mL/min — ABNORMAL LOW (ref >60)
GFR calc non Af Amer: 30 mL/min — ABNORMAL LOW (ref >60)
Glucose, Bld: 88 mg/dL (ref 70–99)
Potassium: 4.1 mmol/L (ref 3.5–5.1)
Sodium: 139 mmol/L (ref 135–145)

## 2019-12-24 LAB — PROCALCITONIN: Procalcitonin: 1.39 ng/mL
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
## 2019-12-24 NOTE — Progress Notes (Signed)
Occupational Therapy Treatment Patient Details Name: [name] MRN: [id number] DOB: [birth year] Today's Date: 12/24/2019    History of present illness Pt is a 74 y/o female admitted secondary to increased dyspnea. Likely from flash pulmonary edema. Pt also with hypertensive urgency. PMH includes PVD, dCHF, CKD, fibromyalgia, COPD, tobacco use, an dHTN.    OT comments  Pt progressing well toward stated goals, focused session on BADL progression and safety. Pt completed toilet transfer with min guard assist. Pt needing safety cues to use RW instead of SPC for increased steadiness to complete household level functional mobility. Noted she fatigues easily with little mobility. Updated recommendations to [facility] for continued progression of BADL in home environment for improved safety. Will continue to follow.   Follow Up Recommendations  Home health OT;Supervision - Intermittent    Equipment Recommendations  Tub/shower seat    Recommendations for Other Services      Precautions / Restrictions Precautions Precautions: Fall Restrictions Weight Bearing Restrictions: No       Mobility Bed Mobility   Bed Mobility: Supine to Sit     Supine to sit: Supervision     General bed mobility comments: supervision for safety  Transfers Overall transfer level: Needs assistance Equipment used: Rolling walker (2 wheeled);Straight cane Transfers: Sit to/from Stand Sit to Stand: Min guard         General transfer comment: used SPC, ultimately needing RW for increased safety and steadiness    Balance Overall balance assessment: Needs assistance Sitting-balance support: No upper extremity supported;Feet supported Sitting balance-Leahy Scale: Good     Standing balance support: Single extremity supported;During functional activity Standing balance-Leahy Scale: Fair Standing balance comment: reliant on UE support                            ADL either performed or assessed  with clinical judgement   ADL Overall ADL's : Needs assistance/impaired             Lower Body Bathing: Min guard;Sit to/from stand Lower Body Bathing Details (indicate cue type and reason): to fix socks EOB         Toilet Transfer: Min guard;Ambulation;Regular Toilet;Grab bars Toilet Transfer Details (indicate cue type and reason): to toilet in bathroom, cues for safe technique Toileting- Clothing Manipulation and Hygiene: Min guard;Sit to/from stand       Functional mobility during ADLs: Min guard;Rolling walker General ADL Comments: attempted initial mobility with cane, ultimatley needing RW for increased safety     Vision Baseline Vision/History: Wears glasses Wears Glasses: Reading only Patient Visual Report: No change from baseline     Perception     Praxis      Cognition Arousal/Alertness: Awake/alert Behavior During Therapy: WFL for tasks assessed/performed Overall Cognitive Status: Within Functional Limits for tasks assessed                                 General Comments: some mild impulsivity and decreased awareness of deficits requiring cues        Exercises     Shoulder Instructions       General Comments      Pertinent Vitals/ Pain       Pain Assessment: Faces Pain Score: 7  Faces Pain Scale: Hurts little more Pain Location: back (pt states this is chronic) Pain Descriptors / Indicators: Aching Pain Intervention(s): Limited activity within patient's tolerance;Monitored  during session  Home Living                                          Prior Functioning/Environment              Frequency  Min 2X/week        Progress Toward Goals  OT Goals(current goals can now be found in the care plan section)  Progress towards OT goals: Progressing toward goals  Acute Rehab OT Goals Patient Stated Goal: to go home OT Goal Formulation: With patient Time For Goal Achievement: 01/06/20 Potential to  Achieve Goals: Good  Plan Discharge plan needs to be updated    Co-evaluation                 AM-PAC OT "6 Clicks" Daily Activity     Outcome Measure   Help from another person eating meals?: None Help from another person taking care of personal grooming?: A Little Help from another person toileting, which includes using toliet, bedpan, or urinal?: A Little Help from another person bathing (including washing, rinsing, drying)?: A Little Help from another person to put on and taking off regular upper body clothing?: None Help from another person to put on and taking off regular lower body clothing?: A Little 6 Click Score: 20    End of Session Equipment Utilized During Treatment: Gait belt  OT Visit Diagnosis: Unsteadiness on feet (R26.81);Muscle weakness (generalized) (M62.81)   Activity Tolerance Patient tolerated treatment well   Patient Left Other (comment)(in hallway with PT)   Nurse Communication          Time: 7544-9201 OT Time Calculation (min): 13 min  Charges: OT General Charges $OT Visit: 1 Visit OT Treatments $Self Care/Home Management : 8-22 mins  Zenovia Jarred, MSOT, OTR/L [facility] Office Number: [phone] Pager: [phone]  Zenovia Jarred 12/24/2019, 4:50 PM
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
## 2019-12-24 NOTE — Progress Notes (Signed)
[facility] PROGRESS NOTE    Progress Note  LEVEDA KENDRIX  IRJ:[id number] DOB: [birth year] DOA: 12/21/2019 PCP: [name], MD     Brief Narrative:   LANASIA PORRAS is an 74 y.o. female past medical history of COPD, chronic diastolic heart failure ongoing tobacco abuse, chronic kidney disease stage III anxiety severe protein malnutrition comes into the ED for acute shortness of breath, she has been noncompliant with her medication in the ED she was noted to be in hypertensive emergency respiratory distress started on BiPAP IV nitroglycerin drip chest x-ray showed probable pulmonary edema.  Due to her elevated white blood cell count consolidation on chest x-ray she was also started on empiric antibiotics.  Assessment/Plan:   Acute respiratory failure with hypercarbia due to acute on chronic diastolic heart failure versus community-acquired pneumonia: Continue IV Lasix and IV antibiotics.  Is remained afebrile with no leukocytosis.  We will try to wean her to room air. Her procalcitonin is improving we will continue current antibiotic regimen. She seems to be as her estimated dry weight.  Change to her home dose of Lasix. She is about 1.5 L negative. Consult physical therapy, physical therapy evaluation is pending.  History of COPD: Seems to be stable no wheezing on physical exam.  Chronic kidney disease stage IV: With a baseline creatinine of 1.6-1.8, she was started on IV Lasix and diuresed about 1.3 L. We will change her to oral Lasix today.  Chronic normocytic anemia: Hemoglobin is stable follow-up with PCP as an outpatient.  Chronic pain syndrome and anxiety: Continue Cymbalta and oxycodone.  Severe protein caloric malnutrition: Ensure 3 times daily.   DVT prophylaxis: lovenox Family Communication:*none Disposition Plan/Barrier to D/C: Once she is off oxygen she can probably be discharged home.  Code Status:     Code Status Orders  (From admission,  onward)         Start     Ordered   12/22/19 0824  Do not attempt resuscitation (DNR)  Continuous    Question Answer Comment  In the event of cardiac or respiratory ARREST Do not call a "code blue"   In the event of cardiac or respiratory ARREST Do not perform Intubation, CPR, defibrillation or ACLS   In the event of cardiac or respiratory ARREST Use medication by any route, position, wound care, and other measures to relive pain and suffering. May use oxygen, suction and manual treatment of airway obstruction as needed for comfort.   Comments Patient is DNR, but is willing to have intubation if indicated.      12/22/19 0824        Code Status History    Date Active Date Inactive Code Status Order ID Comments User Context   12/21/2019 0435 12/22/2019 0824 Partial Code [id number]  Etta Quill, DO ED   12/21/2019 0429 12/21/2019 0435 Partial Code [id number]  Etta Quill, DO ED   12/21/2019 0138 12/21/2019 0429 DNR [id number]  Merrily Pew, MD ED   11/20/2019 0547 11/24/2019 1940 DNR [id number]  [name], MD ED   11/04/2019 0944 11/09/2019 2133 DNR [id number]  [name], MD ED   08/02/2018 0001 08/04/2018 1634 Full Code [id number]  [name], MD ED   05/14/2018 2131 05/17/2018 2309 Full Code [id number]  [name], MD ED   10/10/2017 0130 10/16/2017 1644 Full Code [id number]  [name], MD ED   01/25/2016 2021 02/02/2016 1643 Full Code [id number]  [name], MD Inpatient   07/29/2015  0310 07/30/2015 1738 Full Code [id number]  [name], MD Inpatient   11/23/2011 1823 11/25/2011 1339 Full Code [id number]  [name], MD Inpatient   Advance Care Planning Activity        IV Access:    Peripheral IV   Procedures and diagnostic studies:   DG Chest 2 View  Result Date: 12/22/2019 CLINICAL DATA:  Hypoxia. Near syncope last night. EXAM: CHEST - 2 VIEW COMPARISON:  Chest x-rays dated 12/21/2019, 11/19/2019 and 11/06/2019 FINDINGS: There has been a marked  improvement in the extensive bilateral pulmonary edema with almost complete resolution. There is a persistent area of consolidation/atelectasis at the left lung base posterior medially. There is a small left pleural effusion which appears diminished. Tiny right effusion. Heart size and pulmonary vascularity are now within normal limits. No acute bone abnormality. IMPRESSION: 1. Almost complete resolution of the pulmonary edema. 2. Persistent atelectasis/consolidation at the left lung base. 3. Small effusions. Electronically Signed   By: Lorriane Shire M.D.   On: 12/22/2019 14:28     Medical Consultants:    None.  Anti-Infectives:   Rocephin and azithromycin  Subjective:    GARRY BOCHICCHIO she relates her breathing is improving still remains anorexic, she feels tired.  Objective:    Vitals:   12/24/19 0212 12/24/19 0300 12/24/19 0500 12/24/19 0600  BP:  (!) 141/69 (!) 142/66 (!) 145/70  Pulse:  65 63 63  Resp:  16 19 17   Temp:  98.3 F (36.8 C)    TempSrc:  Oral    SpO2:  91% 92% 93%  Weight: 45.9 kg     Height:       SpO2: 93 % O2 Flow Rate (L/min): 2 L/min FiO2 (%): 40 %   Intake/Output Summary (Last 24 hours) at 12/24/2019 0710 Last data filed at 12/24/2019 0600 Gross per 24 hour  Intake 1122 ml  Output 2100 ml  Net -978 ml   Filed Weights   12/22/19 0400 12/23/19 0443 12/24/19 0212  Weight: 48.6 kg 47.6 kg 45.9 kg    Exam: General exam: In no acute distress, cachectic appears tired Respiratory system: With moderate air movement this morning, with crackles at bases bilaterally it seems like she is putting little bit more effort to breathe today. Cardiovascular system: S1 & S2 heard, RRR. No JVD. Gastrointestinal system: Abdomen is nondistended, soft and nontender.  Extremities: No pedal edema. Skin: No rashes, lesions or ulcers Psychiatry: Judgement and insight appear normal.    Data Reviewed:    Labs: Basic Metabolic Panel: Recent Labs  Lab  12/21/19 0147 12/21/19 0147 12/21/19 0440 12/21/19 0440 12/21/19 1020 12/21/19 1020 12/22/19 0557 12/22/19 0557 12/23/19 0428 12/24/19 0426  NA 142   < > 144  --  142  --  140  --  139 139  K 5.2*   < > 4.6   < > 5.0   < > 4.5   < > 4.3 4.1  CL 115*  --   --   --  110  --  109  --  107 104  CO2 13*  --   --   --  14*  --  21*  --  22 23  GLUCOSE 247*  --   --   --  146*  --  94  --  86 88  BUN 28*  --   --   --  29*  --  30*  --  33* 34*  CREATININE 1.64*  --   --   --  1.57*  --  1.74*  --  1.66* 1.68*  CALCIUM 8.0*  --   --   --  8.1*  --  8.3*  --  8.2* 8.4*   < > = values in this interval not displayed.   GFR Estimated Creatinine Clearance: 21.3 mL/min (A) (by C-G formula based on SCr of 1.68 mg/dL (H)). Liver Function Tests: Recent Labs  Lab 12/21/19 0147  AST 34  ALT 12  ALKPHOS 135*  BILITOT 0.7  PROT 6.5  ALBUMIN 3.4*   No results for input(s): LIPASE, AMYLASE in the last 168 hours. No results for input(s): AMMONIA in the last 168 hours. Coagulation profile No results for input(s): INR, PROTIME in the last 168 hours. COVID-19 Labs  Recent Labs    12/22/19 1102  FERRITIN 20    Lab Results  Component Value Date   SARSCOV2NAA NEGATIVE 12/21/2019   [place] NEGATIVE 11/19/2019   [place] NEGATIVE 11/04/2019    CBC: Recent Labs  Lab 12/21/19 0147 12/21/19 0440 12/21/19 1020 12/22/19 0557 12/23/19 0428  WBC 18.8*  --  12.7* 7.9 5.9  NEUTROABS 11.7*  --   --   --   --   HGB 11.0* 8.5* 10.0* 8.2* 8.0*  HCT 37.6 25.0* 35.3* 27.8* 27.2*  MCV 96.2  --  94.6 92.1 92.5  PLT 446*  --  320 269 248   Cardiac Enzymes: No results for input(s): CKTOTAL, CKMB, CKMBINDEX, TROPONINI in the last 168 hours. BNP (last 3 results) No results for input(s): PROBNP in the last 8760 hours. CBG: No results for input(s): GLUCAP in the last 168 hours. D-Dimer: No results for input(s): DDIMER in the last 72 hours. Hgb A1c: No results for input(s): HGBA1C in the  last 72 hours. Lipid Profile: No results for input(s): CHOL, HDL, LDLCALC, TRIG, CHOLHDL, LDLDIRECT in the last 72 hours. Thyroid function studies: No results for input(s): TSH, T4TOTAL, T3FREE, THYROIDAB in the last 72 hours.  Invalid input(s): FREET3 Anemia work up: Recent Labs    12/22/19 1102  VITAMINB12 136*  FOLATE 17.6  FERRITIN 20  TIBC 440  IRON 17*  RETICCTPCT 1.5   Sepsis Labs: Recent Labs  Lab 12/21/19 0147 12/21/19 0159 12/21/19 0424 12/21/19 1020 12/22/19 0557 12/23/19 0428 12/24/19 0426  PROCALCITON  --   --   --   --   --  2.55 1.39  WBC 18.8*  --   --  12.7* 7.9 5.9  --   LATICACIDVEN  --  2.1* 0.9  --   --   --   --    Microbiology Recent Results (from the past 240 hour(s))  Blood culture (routine x 2)     Status: None (Preliminary result)   Collection Time: 12/21/19  1:48 AM   Specimen: BLOOD LEFT FOREARM  Result Value Ref Range Status   Specimen Description BLOOD LEFT FOREARM  Final   Special Requests   Final    BOTTLES DRAWN AEROBIC AND ANAEROBIC Blood Culture adequate volume   Culture   Final    NO GROWTH 2 DAYS Performed at [facility], [place] [address], [place], [place] [address]    Report Status PENDING  Incomplete  Blood culture (routine x 2)     Status: None (Preliminary result)   Collection Time: 12/21/19  1:48 AM   Specimen: BLOOD  Result Value Ref Range Status   Specimen Description BLOOD RIGHT ANTECUBITAL  Final   Special Requests   Final    BOTTLES  DRAWN AEROBIC AND ANAEROBIC Blood Culture adequate volume   Culture   Final    NO GROWTH 2 DAYS Performed at [facility], [place] [address], [place], [place] [address]    Report Status PENDING  Incomplete  SARS CORONAVIRUS 2 (TAT 6-24 HRS) Nasopharyngeal Nasopharyngeal Swab     Status: None   Collection Time: 12/21/19  4:47 AM   Specimen: Nasopharyngeal Swab  Result Value Ref Range Status   SARS Coronavirus 2 NEGATIVE NEGATIVE Final    Comment: (NOTE) SARS-CoV-2 target  nucleic acids are NOT DETECTED. The SARS-CoV-2 RNA is generally detectable in upper and lower respiratory specimens during the acute phase of infection. Negative results do not preclude SARS-CoV-2 infection, do not rule out co-infections with other pathogens, and should not be used as the sole basis for treatment or other patient management decisions. Negative results must be combined with clinical observations, patient history, and epidemiological information. The expected result is Negative. Fact Sheet for Patients: SugarRoll.be Fact Sheet for Healthcare Providers: https://www.woods-mathews.com/ This test is not yet approved or cleared by the [place] FDA and  has been authorized for detection and/or diagnosis of SARS-CoV-2 by FDA under an Emergency Use Authorization (EUA). This EUA will remain  in effect (meaning this test can be used) for the duration of the COVID-19 declaration under Section 56 4(b)(1) of the Act, 21 U.S.C. section 360bbb-3(b)(1), unless the authorization is terminated or revoked sooner. Performed at [facility], [place] [address] [place], [place] [address]      Medications:   . allopurinol  300 mg Oral Daily  . amLODipine  10 mg Oral Daily  . atorvastatin  20 mg Oral QHS  . azithromycin  500 mg Oral Q24H  . DULoxetine  60 mg Oral BID  . enoxaparin (LOVENOX) injection  20 mg Subcutaneous Q24H  . feeding supplement (ENSURE ENLIVE)  237 mL Oral BID BM  . furosemide  40 mg Oral Daily  . metoprolol succinate  50 mg Oral Daily  . multivitamin with minerals  1 tablet Oral Daily  . nicotine  14 mg Transdermal Daily  . pantoprazole  40 mg Oral BID  . sodium chloride flush  3 mL Intravenous Q12H  . umeclidinium bromide  1 puff Inhalation Daily   Continuous Infusions: . sodium chloride 10 mL/hr at 12/22/19 1700  . cefTRIAXone (ROCEPHIN)  IV 1 g (12/23/19 1658)      LOS: 3 days   Charlynne Cousins  [facility]  12/24/2019, 7:10 AM

## 2019-12-24 NOTE — Progress Notes (Signed)
Physical Therapy Treatment Patient Details Name: [name] MRN: [id number] DOB: April 10, 1946 Today's Date: 12/24/2019    History of Present Illness Pt is a 74 y/o female admitted secondary to increased dyspnea. Likely from flash pulmonary edema. Pt also with hypertensive urgency. PMH includes PVD, dCHF, CKD, fibromyalgia, COPD, tobacco use, an dHTN.     PT Comments    Pt making good progress today.  She was able to ambulate 35' with min guard for safety.  Educated on use of RW and recommendation for use of RW at d/c due to instability just with cane.  VSS on RA.    Follow Up Recommendations  Home health PT;Supervision for mobility/OOB     Equipment Recommendations  None recommended by PT(has RW)    Recommendations for Other Services       Precautions / Restrictions Precautions Precautions: Fall    Mobility  Bed Mobility               General bed mobility comments: up with OT upon arrival  Transfers Overall transfer level: Needs assistance Equipment used: Rolling walker (2 wheeled) Transfers: Sit to/from Stand Sit to Stand: Min guard         General transfer comment: cues for hand placement  Ambulation/Gait Ambulation/Gait assistance: Min guard;Min assist Gait Distance (Feet): 60 Feet   Gait Pattern/deviations: Step-through pattern;Decreased stride length;Trunk flexed Gait velocity: Decreased   General Gait Details: Ambulated 20' with her cane but also reaching for furniture/hand rail requiring min A for balance; switched to RW for remainder of walk and was min guard for safety; recommended use of RW at d/c   Stairs             Wheelchair Mobility    Modified Rankin (Stroke Patients Only)       Balance Overall balance assessment: Needs assistance Sitting-balance support: No upper extremity supported;Feet supported Sitting balance-Leahy Scale: Good     Standing balance support: Single extremity supported;During functional  activity Standing balance-Leahy Scale: Fair                              Cognition Arousal/Alertness: Awake/alert Behavior During Therapy: WFL for tasks assessed/performed Overall Cognitive Status: Within Functional Limits for tasks assessed                                        Exercises      General Comments   O2 sats 96% on RA; HR in the 70's     Pertinent Vitals/Pain Pain Assessment: 0-10 Pain Score: 7  Pain Location: back (pt states this is chronic) Pain Descriptors / Indicators: Aching Pain Intervention(s): Limited activity within patient's tolerance;Patient requesting pain meds-RN notified;Monitored during session;Repositioned    Home Living                      Prior Function            PT Goals (current goals can now be found in the care plan section) Progress towards PT goals: Progressing toward goals    Frequency    Min 3X/week      PT Plan Current plan remains appropriate    Co-evaluation              AM-PAC PT "6 Clicks" Mobility   Outcome Measure  Help needed turning from your  back to your side while in a flat bed without using bedrails?: None Help needed moving from lying on your back to sitting on the side of a flat bed without using bedrails?: None Help needed moving to and from a bed to a chair (including a wheelchair)?: A Little Help needed standing up from a chair using your arms (e.g., wheelchair or bedside chair)?: A Little Help needed to walk in hospital room?: A Little Help needed climbing 3-5 steps with a railing? : A Little 6 Click Score: 20    End of Session Equipment Utilized During Treatment: Gait belt Activity Tolerance: Patient limited by pain Patient left: in chair;with call bell/phone within reach Nurse Communication: Mobility status PT Visit Diagnosis: Unsteadiness on feet (R26.81);Difficulty in walking, not elsewhere classified (R26.2)     Time: 8003-4917 PT Time  Calculation (min) (ACUTE ONLY): 13 min  Charges:  $Gait Training: 8-22 mins                     Maggie Font, PT Acute Rehab Services Pager [phone] [facility] [phone] [facility] [phone]    Karlton Lemon 12/24/2019, 4:23 PM

## 2019-12-25 DIAGNOSIS — J441 Chronic obstructive pulmonary disease with (acute) exacerbation: Secondary | ICD-10-CM | POA: Diagnosis not present

## 2019-12-25 LAB — BASIC METABOLIC PANEL
Anion gap: 12 (ref 5–15)
BUN: 39 mg/dL — ABNORMAL HIGH (ref 8–23)
CO2: 22 mmol/L (ref 22–32)
Calcium: 8.5 mg/dL — ABNORMAL LOW (ref 8.9–10.3)
Chloride: 104 mmol/L (ref 98–111)
Creatinine, Ser: 1.83 mg/dL — ABNORMAL HIGH (ref 0.44–1.00)
GFR calc Af Amer: 31 mL/min — ABNORMAL LOW (ref >60)
GFR calc non Af Amer: 27 mL/min — ABNORMAL LOW (ref >60)
Glucose, Bld: 99 mg/dL (ref 70–99)
Potassium: 4.6 mmol/L (ref 3.5–5.1)
Sodium: 138 mmol/L (ref 135–145)

## 2019-12-25 LAB — PROCALCITONIN: Procalcitonin: 0.73 ng/mL

## 2019-12-25 MED ORDER — AZITHROMYCIN 500 MG PO TABS: 500.0000 mg | ORAL_TABLET | ORAL | 0 refills | Status: DC

## 2019-12-25 MED ORDER — AMOXICILLIN-POT CLAVULANATE 500-125 MG PO TABS: 1.0000 | ORAL_TABLET | Freq: Two times a day (BID) | ORAL | 0 refills | Status: DC

## 2019-12-25 MED ORDER — AMOXICILLIN-POT CLAVULANATE 500-125 MG PO TABS
1.0000 | ORAL_TABLET | Freq: Two times a day (BID) | ORAL | Status: DC
  Administered 2019-12-25: 500 mg via ORAL
  Filled 2019-12-25 (×2): qty 1

## 2019-12-25 NOTE — Care Management Important Message (Signed)
Important Message  Patient Details  Name: [name] MRN: [id number] Date of Birth: 02/08/1946   Medicare Important Message Given:  Yes     Shelda Altes 12/25/2019, 10:14 AM

## 2019-12-25 NOTE — Discharge Summary (Addendum)
Physician Discharge Summary  [name] WRU:[id number] DOB: 08-03-1946 DOA: 12/21/2019  PCP: [name], MD  Admit date: 12/21/2019 Discharge date: 01/01/2020  Admitted From: Home Disposition:  Home  Recommendations for Outpatient Follow-up:  Follow up with PCP in 1-2 weeks Please obtain BMP/CBC in one week   Home Health:Yes Equipment/Devices:None  Discharge Condition:Stable CODE STATUS:Full Diet recommendation: Heart Healthy   Brief/Interim Summary: 74 y.o. female past medical history of COPD, chronic diastolic heart failure ongoing tobacco abuse, chronic kidney disease stage III anxiety severe protein malnutrition comes into the ED for acute shortness of breath, she has been noncompliant with her medication in the ED she was noted to be in hypertensive emergency respiratory distress started on BiPAP IV nitroglycerin drip chest x-ray showed probable pulmonary edema.    Discharge Diagnoses:  Principal Problem:   Flash pulmonary edema (HCC) Active Problems:   Severe protein-calorie malnutrition (HCC)   Acute respiratory failure with hypercapnia (HCC)   DNR (do not resuscitate)   Hypertensive urgency   Acute on chronic diastolic CHF (congestive heart failure) (HCC)   CKD (chronic kidney disease) stage 3, GFR 30-59 ml/min   Malnutrition of moderate degree Acute respiratory failure with hypercarbia due to acute on chronic diastolic heart failure and possibly community-acquired pneumonia: She was placed on supplemental oxygen and BiPAP on admission she was started on IV Lasix and empiric antibiotics, she had elevated white count and procalcitonin which subsided after of antibiotics were started. She completed her course of antibiotics in house her Lasix was changed to her home dose. Physical therapy evaluated the patient and recommended home health PT.  History of COPD: Stable no wheezing physical exam.  Chronic kidney disease stage IV: With a baseline creatinine of  1.6-1.8 no changes made to her medication see above for further details.  Chronic normocytic anemia: Follow-up with PCP as an outpatient.  Chronic pain syndrome: Continue Cymbalta and oxycodone.  Severe protein caloric malnutrition: Ensure 3 times daily.    Discharge Instructions  Discharge Instructions     Diet - low sodium heart healthy   Complete by: As directed    Increase activity slowly   Complete by: As directed       Allergies as of 12/25/2019   No Known Allergies      Medication List     STOP taking these medications    nicotine 14 mg/24hr patch Commonly known as: NICODERM CQ - dosed in mg/24 hours       TAKE these medications    albuterol (2.5 MG/3ML) 0.083% nebulizer solution Commonly known as: PROVENTIL Inhale 3 mLs into the lungs 2 (two) times daily.   albuterol 108 (90 Base) MCG/ACT inhaler Commonly known as: VENTOLIN HFA Inhale 2 puffs into the lungs every 6 (six) hours as needed for wheezing or shortness of breath.   allopurinol 300 MG tablet Commonly known as: ZYLOPRIM Take 1 tablet (300 mg total) by mouth daily.   amLODipine 10 MG tablet Commonly known as: NORVASC Take 1 tablet (10 mg total) by mouth daily.   amoxicillin-clavulanate 500-125 MG tablet Commonly known as: AUGMENTIN Take 1 tablet (500 mg total) by mouth every 12 (twelve) hours.   atorvastatin 20 MG tablet Commonly known as: LIPITOR Take 1 tablet (20 mg total) by mouth at bedtime.   azithromycin 500 MG tablet Commonly known as: ZITHROMAX Take 1 tablet (500 mg total) by mouth daily.   CALCIUM PO Take 1 tablet by mouth daily.   diphenhydrAMINE 25 mg capsule Commonly known  as: BENADRYL Take 25 mg by mouth every 6 (six) hours as needed for allergies.   DULoxetine 60 MG capsule Commonly known as: CYMBALTA Take 60 mg by mouth 2 (two) times daily.   furosemide 40 MG tablet Commonly known as: Lasix Take 1 tablet (40 mg total) by mouth daily. What changed:  when  to take this reasons to take this   levorphanol 2 MG tablet Commonly known as: LEVODROMORAN Take 2 mg by mouth 2 (two) times daily.   metoprolol succinate 50 MG 24 hr tablet Commonly known as: TOPROL-XL Take 1 tablet (50 mg total) by mouth daily. Take with or immediately following a meal.   multivitamin capsule Take 1 capsule by mouth daily.   pantoprazole 40 MG tablet Commonly known as: PROTONIX Take 40 mg by mouth 2 (two) times daily.   umeclidinium bromide 62.5 MCG/INH Aepb Commonly known as: INCRUSE ELLIPTA Inhale 1 puff into the lungs daily.       Follow-up Information     [name], MD.   Specialty: Internal Medicine Why: GO: MARCH 12 AT [address] information: [place] [address] [place] Follow up.   Why: ZJIR,CVEL [phone]          No Known Allergies  Consultations: None   Procedures/Studies: DG Chest 2 View  Result Date: 12/22/2019 CLINICAL DATA:  Hypoxia. Near syncope last night. EXAM: CHEST - 2 VIEW COMPARISON:  Chest x-rays dated 12/21/2019, 11/19/2019 and 11/06/2019 FINDINGS: There has been a marked improvement in the extensive bilateral pulmonary edema with almost complete resolution. There is a persistent area of consolidation/atelectasis at the left lung base posterior medially. There is a small left pleural effusion which appears diminished. Tiny right effusion. Heart size and pulmonary vascularity are now within normal limits. No acute bone abnormality. IMPRESSION: 1. Almost complete resolution of the pulmonary edema. 2. Persistent atelectasis/consolidation at the left lung base. 3. Small effusions. Electronically Signed   By: Lorriane Shire M.D.   On: 12/22/2019 14:28   DG Chest Portable 1 View  Result Date: 12/21/2019 CLINICAL DATA:  Short of breath, respiratory distress EXAM: PORTABLE CHEST 1 VIEW COMPARISON:  11/19/2019 FINDINGS: Single frontal view of  the chest demonstrates borderline size of the cardiac silhouette, likely due to portable AP technique. Slight decrease in left pleural effusion since prior study. There is increasing left lower lobe consolidation primarily in the retrocardiac region. There is been progression of the diffuse interstitial and ground-glass opacity seen previously. No pneumothorax. No acute bony abnormalities. IMPRESSION: 1. Progressive interstitial and ground-glass opacities throughout the lungs. This could reflect worsening volume status, though superimposed atypical pneumonia cannot be excluded. 2. Dense consolidation left lower lobe, with associated small residual left pleural effusion. Electronically Signed   By: Randa Ngo M.D.   On: 12/21/2019 01:40     Subjective: No complaints feels great.  Discharge Exam: Vitals:   12/25/19 0754 12/25/19 0800  BP: (!) 146/90 (!) 147/67  Pulse: 66 62  Resp: 11 16  Temp: 97.9 F (36.6 C)   SpO2: 96% 99%   Vitals:   12/25/19 0652 12/25/19 0653 12/25/19 0754 12/25/19 0800  BP:   (!) 146/90 (!) 147/67  Pulse: 70 67 66 62  Resp: (!) 22 15 11 16   Temp:   97.9 F (36.6 C)   TempSrc:   Oral   SpO2: 100% 100% 96% 99%  Weight:  Height:        General: Pt is alert, awake, not in acute distress Cardiovascular: RRR, S1/S2 +, no rubs, no gallops Respiratory: CTA bilaterally, no wheezing, no rhonchi Abdominal: Soft, NT, ND, bowel sounds + Extremities: no edema, no cyanosis    The results of significant diagnostics from this hospitalization (including imaging, microbiology, ancillary and laboratory) are listed below for reference.     Microbiology: No results found for this or any previous visit (from the past 240 hour(s)).    Labs: BNP (last 3 results) Recent Labs    11/19/19 2306 11/23/19 0429 12/21/19 0147  BNP 3,431.8* 375.5* 7,116.5*   Basic Metabolic Panel: No results for input(s): NA, K, CL, CO2, GLUCOSE, BUN, CREATININE, CALCIUM, MG, PHOS in  the last 168 hours.  Liver Function Tests: No results for input(s): AST, ALT, ALKPHOS, BILITOT, PROT, ALBUMIN in the last 168 hours.  No results for input(s): LIPASE, AMYLASE in the last 168 hours. No results for input(s): AMMONIA in the last 168 hours. CBC: No results for input(s): WBC, NEUTROABS, HGB, HCT, MCV, PLT in the last 168 hours.  Cardiac Enzymes: No results for input(s): CKTOTAL, CKMB, CKMBINDEX, TROPONINI in the last 168 hours. BNP: Invalid input(s): POCBNP CBG: No results for input(s): GLUCAP in the last 168 hours. D-Dimer No results for input(s): DDIMER in the last 72 hours. Hgb A1c No results for input(s): HGBA1C in the last 72 hours. Lipid Profile No results for input(s): CHOL, HDL, LDLCALC, TRIG, CHOLHDL, LDLDIRECT in the last 72 hours. Thyroid function studies No results for input(s): TSH, T4TOTAL, T3FREE, THYROIDAB in the last 72 hours.  Invalid input(s): FREET3 Anemia work up No results for input(s): VITAMINB12, FOLATE, FERRITIN, TIBC, IRON, RETICCTPCT in the last 72 hours.  Urinalysis    Component Value Date/Time   COLORURINE STRAW (A) 11/04/2019 0911   APPEARANCEUR CLEAR 11/04/2019 0911   LABSPEC 1.010 11/04/2019 0911   PHURINE 5.0 11/04/2019 0911   GLUCOSEU NEGATIVE 11/04/2019 0911   HGBUR NEGATIVE 11/04/2019 0911   BILIRUBINUR NEGATIVE 11/04/2019 0911   KETONESUR NEGATIVE 11/04/2019 0911   PROTEINUR NEGATIVE 11/04/2019 0911   UROBILINOGEN 0.2 07/28/2015 2223   NITRITE NEGATIVE 11/04/2019 0911   LEUKOCYTESUR MODERATE (A) 11/04/2019 0911   Sepsis Labs Invalid input(s): PROCALCITONIN,  WBC,  LACTICIDVEN Microbiology No results found for this or any previous visit (from the past 240 hour(s)).    Time coordinating discharge: Over 30 minutes  SIGNED:   Charlynne Cousins, MD  [facility] 01/01/2020, 8:50 AM Pager   If 7PM-7AM, please contact night-coverage [email or web address] Password [email or web address]

## 2019-12-25 NOTE — Plan of Care (Signed)

## 2019-12-25 NOTE — Progress Notes (Signed)
Someone is taking her money

## 2019-12-25 NOTE — Progress Notes (Signed)
Have some kind of bugs coming out of vents, not sure what kind

## 2019-12-25 NOTE — Progress Notes (Signed)
Instructed patient and home aid Alex on discharge instructions, medication, admin and new prescriptions, educated on when to call provider and follow up appointments.  No questions noted.  Patient readyor discharge and taken out to private car in wheelchair per Probation officer.  No distress noted.

## 2019-12-25 NOTE — Progress Notes (Signed)
Pharmacy delivers meds. She has medicaid transport

## 2019-12-26 LAB — CULTURE, BLOOD (ROUTINE X 2)
Culture: NO GROWTH
Culture: NO GROWTH
Special Requests: ADEQUATE
Special Requests: ADEQUATE

## 2019-12-27 DIAGNOSIS — M797 Fibromyalgia: Secondary | ICD-10-CM | POA: Diagnosis not present

## 2019-12-27 DIAGNOSIS — G894 Chronic pain syndrome: Secondary | ICD-10-CM | POA: Diagnosis not present

## 2019-12-27 DIAGNOSIS — I2510 Atherosclerotic heart disease of native coronary artery without angina pectoris: Secondary | ICD-10-CM | POA: Diagnosis not present

## 2019-12-27 DIAGNOSIS — I739 Peripheral vascular disease, unspecified: Secondary | ICD-10-CM | POA: Diagnosis not present

## 2019-12-27 DIAGNOSIS — E43 Unspecified severe protein-calorie malnutrition: Secondary | ICD-10-CM | POA: Diagnosis not present

## 2019-12-27 DIAGNOSIS — J449 Chronic obstructive pulmonary disease, unspecified: Secondary | ICD-10-CM | POA: Diagnosis not present

## 2019-12-27 DIAGNOSIS — I5033 Acute on chronic diastolic (congestive) heart failure: Secondary | ICD-10-CM | POA: Diagnosis not present

## 2019-12-27 DIAGNOSIS — M48061 Spinal stenosis, lumbar region without neurogenic claudication: Secondary | ICD-10-CM | POA: Diagnosis not present

## 2019-12-27 DIAGNOSIS — I130 Hypertensive heart and chronic kidney disease with heart failure and stage 1 through stage 4 chronic kidney disease, or unspecified chronic kidney disease: Secondary | ICD-10-CM | POA: Diagnosis not present

## 2019-12-27 DIAGNOSIS — N183 Chronic kidney disease, stage 3 unspecified: Secondary | ICD-10-CM | POA: Diagnosis not present

## 2019-12-27 DIAGNOSIS — G20 Parkinson's disease: Secondary | ICD-10-CM | POA: Diagnosis not present

## 2019-12-27 DIAGNOSIS — D631 Anemia in chronic kidney disease: Secondary | ICD-10-CM | POA: Diagnosis not present

## 2019-12-27 DIAGNOSIS — M1990 Unspecified osteoarthritis, unspecified site: Secondary | ICD-10-CM | POA: Diagnosis not present

## 2020-01-19 DIAGNOSIS — I2510 Atherosclerotic heart disease of native coronary artery without angina pectoris: Secondary | ICD-10-CM | POA: Diagnosis not present

## 2020-01-19 DIAGNOSIS — I130 Hypertensive heart and chronic kidney disease with heart failure and stage 1 through stage 4 chronic kidney disease, or unspecified chronic kidney disease: Secondary | ICD-10-CM | POA: Diagnosis not present

## 2020-01-19 DIAGNOSIS — I5033 Acute on chronic diastolic (congestive) heart failure: Secondary | ICD-10-CM | POA: Diagnosis not present

## 2020-01-19 DIAGNOSIS — N183 Chronic kidney disease, stage 3 unspecified: Secondary | ICD-10-CM | POA: Diagnosis not present

## 2020-01-25 NOTE — Progress Notes (Deleted)
Cardiology Office Note    Date:  01/26/2020   ID:  [name], [name] [birth year], MRN [id number]  PCP:  [name], MD  Cardiologist: Ena Dawley, MD EPS: None  No chief complaint on file.   History of Present Illness:  [name] is a 74 y.o. [name] with history of hypertension, chronic diastolic CHF, COPD, Parkinson's, PAD  Patient was discharged from the hospital 12/25/2019 after admission with acute respiratory failure with hypercarbia due to acute on chronic diastolic CHF and possible pneumonia.  She had been noncompliant with her medications and had severe protein calorie malnutrition.  She had hypertensive urgency, cardiology did not see her that hospitalization.  She was hospitalized twice in January with the same and echo showed normal LVEF 60 to 65% with moderate LVH.  She had mildly elevated troponins, no EKG changes or chest pain with no plans for ischemic work-up.  Past Medical History:  Diagnosis Date  . Anxiety and depression   . Arthritis   . Asthma   . Carotid artery occlusion   . Chronic kidney disease   . Chronic pain    leg and feet  . COPD (chronic obstructive pulmonary disease) ([place])   . Coronary artery disease   . DDD (degenerative disc disease)   . Diarrhea    chronic   . DVT (deep venous thrombosis) ([place])   . Dyspnea   . Fibromyalgia   . GERD (gastroesophageal reflux disease)   . Grave's disease   . Headache   . HOH (hard of hearing)   . Hypertension   . OP (osteoporosis)   . Parkinson's disease ([place])   . Peripheral vascular disease ([place])   . PUD (peptic ulcer disease)   . Recurrent upper respiratory infection (URI)   . Rhinitis   . Sleep apnea   . Spinal stenosis of lumbar region 04/23/2013  . Thrombophlebitis   . Tobacco use disorder 04/23/2013  . Vitamin B 12 deficiency     Past Surgical History:  Procedure Laterality Date  . CAROTID ENDARTERECTOMY Left Sept. 20,2011   cea  . CHOLECYSTECTOMY  1999  . GASTRECTOMY      age 18   Part of small intestin and part of stomach  . LEFT HEART CATHETERIZATION WITH CORONARY ANGIOGRAM N/A 10/02/2011   Procedure: LEFT HEART CATHETERIZATION WITH CORONARY ANGIOGRAM;  Surgeon: Sueanne Margarita, MD;  Location: [facility];  Service: Cardiovascular;  Laterality: N/A;    Current Medications: No outpatient medications have been marked as taking for the 01/26/20 encounter (Appointment) with Imogene Burn, PA-C.     Allergies:   Patient has no known allergies.   Social History   Socioeconomic History  . Marital status: Widowed    Spouse name: Not on file  . Number of children: 1  . Years of education: Not on file  . Highest education level: Not on file  Occupational History  . Occupation: retired  Tobacco Use  . Smoking status: Current Every Day Smoker    Packs/day: 2.00    Years: 50.00    Pack years: 100.00    Types: Cigarettes  . Smokeless tobacco: Never Used  . Tobacco comment: cessation info given and reviewed  Substance and Sexual Activity  . Alcohol use: No    Alcohol/week: 0.0 standard drinks  . Drug use: No  . Sexual activity: Not Currently    Birth control/protection: Post-menopausal  Other Topics Concern  . Not on file  Social History Narrative  .  Not on file   Social Determinants of Health   Financial Resource Strain: High Risk  . Difficulty of Paying Living Expenses: Very hard  Food Insecurity: Food Insecurity Present  . Worried About Charity fundraiser in the Last Year: Sometimes true  . Ran Out of Food in the Last Year: Never true  Transportation Needs: No Transportation Needs  . Lack of Transportation (Medical): No  . Lack of Transportation (Non-Medical): No  Physical Activity:   . Days of Exercise per Week:   . Minutes of Exercise per Session:   Stress: Stress Concern Present  . Feeling of Stress : To some extent  Social Connections: Moderately Isolated  . Frequency of Communication with Friends and Family: Three times a week    . Frequency of Social Gatherings with Friends and Family: Once a week  . Attends Religious Services: Never  . Active Member of Clubs or Organizations: No  . Attends Archivist Meetings: Never  . Marital Status: Widowed     Family History:  The patient's ***family history includes Diabetes in her father, mother, sister, and sister; Heart attack in her father, mother, and sister; Heart disease in her father, mother, and sister; Hyperlipidemia in her father, mother, sister, and sister; Hypertension in her father, mother, sister, and sister; Other in her father, mother, and sister; Peripheral vascular disease in her sister; Varicose Veins in her father, mother, sister, and sister.   ROS:   Please see the history of present illness.    ROS All other systems reviewed and are negative.   PHYSICAL EXAM:   VS:  There were no vitals taken for this visit.  Physical Exam  GEN: Well nourished, well developed, in no acute distress  HEENT: normal  Neck: no JVD, carotid bruits, or masses Cardiac:RRR; no murmurs, rubs, or gallops  Respiratory:  clear to auscultation bilaterally, normal work of breathing GI: soft, nontender, nondistended, + BS Ext: without cyanosis, clubbing, or edema, Good distal pulses bilaterally MS: no deformity or atrophy  Skin: warm and dry, no rash Neuro:  Alert and Oriented x 3, Strength and sensation are intact Psych: euthymic mood, full affect  Wt Readings from Last 3 Encounters:  12/25/19 101 lb 4.8 oz (45.9 kg)  11/24/19 96 lb 11.2 oz (43.9 kg)  11/09/19 109 lb 4.8 oz (49.6 kg)      Studies/Labs Reviewed:   EKG:  EKG is*** ordered today.  The ekg ordered today demonstrates ***  Recent Labs: 11/20/2019: TSH 0.392 12/21/2019: ALT 12; B Natriuretic Peptide 2,054.2 12/23/2019: Hemoglobin 8.0; Platelets 248 12/25/2019: BUN 39; Creatinine, Ser 1.83; Potassium 4.6; Sodium 138   Lipid Panel    Component Value Date/Time   CHOL 145 11/05/2019 0458   TRIG 90  11/05/2019 0458   HDL 64 11/05/2019 0458   CHOLHDL 2.3 11/05/2019 0458   VLDL 18 11/05/2019 0458   LDLCALC 63 11/05/2019 0458    Additional studies/ records that were reviewed today include:  Echocardiogram November 03, 2018  1. Left ventricular ejection fraction, by visual estimation, is 60 to 65%. The left ventricle has normal function. There is moderately increased left ventricular hypertrophy.  2. Left ventricular diastolic parameters are indeterminate.  3. Global right ventricle has normal systolic function.The right ventricular size is normal.  4. Left atrial size was normal.  5. Right atrial size was normal.  6. The mitral valve is normal in structure. Trivial mitral valve regurgitation.  7. The tricuspid valve is normal in structure.  8. The aortic valve is tricuspid. Aortic valve regurgitation is not visualized. No evidence of aortic valve sclerosis or stenosis.  9. The pulmonic valve was not well visualized. Pulmonic valve regurgitation is not visualized. 10. The inferior vena cava is dilated in size with >50% respiratory variability, suggesting right atrial pressure of 8 mmHg. 11. The tricuspid regurgitant velocity is 2.53 m/s, and with an assumed right atrial pressure of 8 mmHg, the estimated right ventricular systolic pressure is mildly elevated at 33.6 mmHg.       ASSESSMENT:    No diagnosis found.   PLAN:  In order of problems listed above:  Chronic diastolic CHF-hospitalized in January and February for such.  Echo in January normal LVEF 60 to 65% with moderate LVH  Hypertension  COPD  Tobacco abuse  CKD stage III  Severe protein malnutrition    Medication Adjustments/Labs and Tests Ordered: Current medicines are reviewed at length with the patient today.  Concerns regarding medicines are outlined above.  Medication changes, Labs and Tests ordered today are listed in the Patient Instructions below. There are no Patient Instructions on file for this visit.    Signed, Ermalinda Barrios, PA-C  01/26/2020 1:21 PM    [facility] [address], [place], [place]  [address] Phone: [phone]; Fax: [phone]

## 2020-01-26 ENCOUNTER — Ambulatory Visit: Payer: Medicare Other | Admitting: Physician Assistant

## 2020-01-31 ENCOUNTER — Other Ambulatory Visit: Payer: Self-pay

## 2020-01-31 ENCOUNTER — Emergency Department (HOSPITAL_COMMUNITY): Payer: Medicare HMO

## 2020-01-31 ENCOUNTER — Encounter (HOSPITAL_COMMUNITY): Payer: Self-pay

## 2020-01-31 ENCOUNTER — Emergency Department (HOSPITAL_COMMUNITY)
Admission: EM | Admit: 2020-01-31 | Discharge: 2020-01-31 | Disposition: A | Discharge status: Home / Self Care | Payer: Medicare HMO | Source: Home / Self Care | Attending: Emergency Medicine | Admitting: Emergency Medicine

## 2020-01-31 DIAGNOSIS — R911 Solitary pulmonary nodule: Secondary | ICD-10-CM

## 2020-01-31 DIAGNOSIS — J9601 Acute respiratory failure with hypoxia: Secondary | ICD-10-CM | POA: Diagnosis not present

## 2020-01-31 DIAGNOSIS — N183 Chronic kidney disease, stage 3 unspecified: Secondary | ICD-10-CM | POA: Insufficient documentation

## 2020-01-31 DIAGNOSIS — R0602 Shortness of breath: Secondary | ICD-10-CM

## 2020-01-31 DIAGNOSIS — I5032 Chronic diastolic (congestive) heart failure: Secondary | ICD-10-CM | POA: Insufficient documentation

## 2020-01-31 DIAGNOSIS — Z79899 Other long term (current) drug therapy: Secondary | ICD-10-CM | POA: Insufficient documentation

## 2020-01-31 DIAGNOSIS — G20 Parkinson's disease: Secondary | ICD-10-CM | POA: Insufficient documentation

## 2020-01-31 DIAGNOSIS — J449 Chronic obstructive pulmonary disease, unspecified: Secondary | ICD-10-CM | POA: Insufficient documentation

## 2020-01-31 DIAGNOSIS — I5033 Acute on chronic diastolic (congestive) heart failure: Secondary | ICD-10-CM | POA: Diagnosis not present

## 2020-01-31 DIAGNOSIS — I2510 Atherosclerotic heart disease of native coronary artery without angina pectoris: Secondary | ICD-10-CM | POA: Diagnosis not present

## 2020-01-31 DIAGNOSIS — F17210 Nicotine dependence, cigarettes, uncomplicated: Secondary | ICD-10-CM | POA: Insufficient documentation

## 2020-01-31 DIAGNOSIS — I130 Hypertensive heart and chronic kidney disease with heart failure and stage 1 through stage 4 chronic kidney disease, or unspecified chronic kidney disease: Secondary | ICD-10-CM | POA: Insufficient documentation

## 2020-01-31 DIAGNOSIS — J441 Chronic obstructive pulmonary disease with (acute) exacerbation: Secondary | ICD-10-CM | POA: Diagnosis not present

## 2020-01-31 LAB — CBC WITH DIFFERENTIAL/PLATELET
Abs Immature Granulocytes: 0.05 10*3/uL (ref 0.00–0.07)
Basophils Absolute: 0 10*3/uL (ref 0.0–0.1)
Basophils Relative: 0 %
Eosinophils Absolute: 0.1 10*3/uL (ref 0.0–0.5)
Eosinophils Relative: 1 %
HCT: 28.2 % — ABNORMAL LOW (ref 36.0–46.0)
Hemoglobin: 8.5 g/dL — ABNORMAL LOW (ref 12.0–15.0)
Immature Granulocytes: 0 %
Lymphocytes Relative: 5 %
Lymphs Abs: 0.6 10*3/uL — ABNORMAL LOW (ref 0.7–4.0)
MCH: 27.5 pg (ref 26.0–34.0)
MCHC: 30.1 g/dL (ref 30.0–36.0)
MCV: 91.3 fL (ref 80.0–100.0)
Monocytes Absolute: 0.2 10*3/uL (ref 0.1–1.0)
Monocytes Relative: 2 %
Neutro Abs: 10.9 10*3/uL — ABNORMAL HIGH (ref 1.7–7.7)
Neutrophils Relative %: 92 %
Platelets: 293 10*3/uL (ref 150–400)
RBC: 3.09 MIL/uL — ABNORMAL LOW (ref 3.87–5.11)
RDW: 17.5 % — ABNORMAL HIGH (ref 11.5–15.5)
WBC: 11.9 10*3/uL — ABNORMAL HIGH (ref 4.0–10.5)
nRBC: 0 % (ref 0.0–0.2)

## 2020-01-31 LAB — COMPREHENSIVE METABOLIC PANEL
ALT: 10 U/L (ref 0–44)
AST: 14 U/L — ABNORMAL LOW (ref 15–41)
Albumin: 3.4 g/dL — ABNORMAL LOW (ref 3.5–5.0)
Alkaline Phosphatase: 136 U/L — ABNORMAL HIGH (ref 38–126)
Anion gap: 9 (ref 5–15)
BUN: 34 mg/dL — ABNORMAL HIGH (ref 8–23)
CO2: 17 mmol/L — ABNORMAL LOW (ref 22–32)
Calcium: 8.5 mg/dL — ABNORMAL LOW (ref 8.9–10.3)
Chloride: 116 mmol/L — ABNORMAL HIGH (ref 98–111)
Creatinine, Ser: 1.2 mg/dL — ABNORMAL HIGH (ref 0.44–1.00)
GFR calc Af Amer: 52 mL/min — ABNORMAL LOW (ref >60)
GFR calc non Af Amer: 44 mL/min — ABNORMAL LOW (ref >60)
Glucose, Bld: 120 mg/dL — ABNORMAL HIGH (ref 70–99)
Potassium: 4.1 mmol/L (ref 3.5–5.1)
Sodium: 142 mmol/L (ref 135–145)
Total Bilirubin: 0.6 mg/dL (ref 0.3–1.2)
Total Protein: 6.5 g/dL (ref 6.5–8.1)

## 2020-01-31 LAB — TROPONIN I (HIGH SENSITIVITY)
Troponin I (High Sensitivity): 27 ng/L — ABNORMAL HIGH (ref <18)
Troponin I (High Sensitivity): 23 ng/L — ABNORMAL HIGH (ref <18)

## 2020-01-31 LAB — BRAIN NATRIURETIC PEPTIDE: B Natriuretic Peptide: 1213.5 pg/mL — ABNORMAL HIGH (ref 0.0–100.0)

## 2020-01-31 LAB — CBG MONITORING, ED: Glucose-Capillary: 125 mg/dL — ABNORMAL HIGH (ref 70–99)

## 2020-01-31 MED ORDER — OXYCODONE HCL 5 MG PO TABS
7.5000 mg | ORAL_TABLET | Freq: Once | ORAL | Status: AC
  Administered 2020-01-31: 10:00:00 7.5 mg via ORAL
  Filled 2020-01-31: qty 2

## 2020-01-31 MED ORDER — ACETAMINOPHEN 325 MG PO TABS
650.0000 mg | ORAL_TABLET | Freq: Once | ORAL | Status: AC
  Administered 2020-01-31: 08:00:00 650 mg via ORAL
  Filled 2020-01-31: qty 2

## 2020-01-31 MED ORDER — UMECLIDINIUM BROMIDE 62.5 MCG/INH IN AEPB: 1.0000 | INHALATION_SPRAY | Freq: Every day | RESPIRATORY_TRACT | 0 refills | Status: DC

## 2020-01-31 MED ORDER — IPRATROPIUM-ALBUTEROL 0.5-2.5 (3) MG/3ML IN SOLN
3.0000 mL | Freq: Once | RESPIRATORY_TRACT | Status: AC
  Administered 2020-01-31: 06:00:00 3 mL via RESPIRATORY_TRACT
  Filled 2020-01-31: qty 3

## 2020-01-31 NOTE — ED Notes (Signed)
Discharge paperwork reviewed with pt.  Pt with no questions or concerns at this time. Pt reports she will try to call a friend for transport home.

## 2020-01-31 NOTE — ED Triage Notes (Signed)
Copd exacerbation. Started earlier today.

## 2020-01-31 NOTE — ED Provider Notes (Signed)
Physical Exam  BP (!) 176/88   Pulse 97   Temp 98.2 F (36.8 C) (Rectal)   Resp (!) 24   Ht 5\' 4"  (1.626 m)   Wt 48.5 kg   SpO2 97%   BMI 18.37 kg/m   Physical Exam Vitals and nursing note reviewed.  Constitutional:      General: She is not in acute distress.    Appearance: She is well-developed. She is not diaphoretic.  HENT:     Head: Normocephalic and atraumatic.  Eyes:     General: No scleral icterus.    Conjunctiva/sclera: Conjunctivae normal.  Pulmonary:     Effort: Pulmonary effort is normal. No respiratory distress.  Musculoskeletal:     Cervical back: Normal range of motion.  Skin:    Findings: No rash.  Neurological:     Mental Status: She is alert.     ED Course/Procedures     Procedures  MDM   Care of patient resumed from PA [place] at 7:00 AM.  Agree with history, physical exam and plan.  See their note for further details.  Briefly, 74 y.o. female with PMH/PSH as below who presents with a Chief complaint of shortness of breath.  Patient has a history of COPD and is concerned that she is coughing more often than usual.  Has not been using her albuterol and ran out of her daily inhaler yesterday.  Denies fevers, chest pain.  Oxygen saturations have been above 96% on room air.  She has had minimal improvement with DuoNeb treatment.  Chest x-ray shows area of chronic consolidation without any acute findings.  EKG is nonischemic.  Past Medical History:  Diagnosis Date  . Anxiety and depression   . Arthritis   . Asthma   . Carotid artery occlusion   . Chronic kidney disease   . Chronic pain    leg and feet  . COPD (chronic obstructive pulmonary disease) ([place])   . Coronary artery disease   . DDD (degenerative disc disease)   . Diarrhea    chronic   . DVT (deep venous thrombosis) ([place])   . Dyspnea   . Fibromyalgia   . GERD (gastroesophageal reflux disease)   . Grave's disease   . Headache   . HOH (hard of hearing)   . Hypertension   . OP  (osteoporosis)   . Parkinson's disease ([place])   . Peripheral vascular disease ([place])   . PUD (peptic ulcer disease)   . Recurrent upper respiratory infection (URI)   . Rhinitis   . Sleep apnea   . Spinal stenosis of lumbar region 04/23/2013  . Thrombophlebitis   . Tobacco use disorder 04/23/2013  . Vitamin B 12 deficiency    Past Surgical History:  Procedure Laterality Date  . CAROTID ENDARTERECTOMY Left Sept. 20,2011   cea  . CHOLECYSTECTOMY  1999  . GASTRECTOMY     age 52   Part of small intestin and part of stomach  . LEFT HEART CATHETERIZATION WITH CORONARY ANGIOGRAM N/A 10/02/2011   Procedure: LEFT HEART CATHETERIZATION WITH CORONARY ANGIOGRAM;  Surgeon: Sueanne Margarita, MD;  Location: [facility] CATH [facility];  Service: Cardiovascular;  Laterality: N/A;      Current Plan: Plan is to follow-up on lab work.  Will need to ambulate while checking pulse oximetry to ensure no hypoxia.  If her work-up is reassuring and her symptoms have improved, she can be discharged home with a refill of her daily inhaler.   MDM/ED Course: 9:31 AM  Lab work significant for initial troponin of 27, delta of 23.  She has had elevated troponins in the past and this is actually lower than her usual readings.  BNP is in the 1000s however this appears similar to prior values.  At this time I have low suspicion for ACS, CHF exacerbation for the cause of her symptoms based on her lab work and she does not appear clinically fluid overloaded.  Chest x-ray without any signs of fluid overload.  Suspect that her symptoms are most likely due to her COPD. She ambulated here with pulse oximetry 100% on RA. She is requesting a dose of her home pain medication which I have given her.  10:06 AM Will have her f/u with PCP and return for worsening symptoms.   Consults: None   Significant labs/images: Labs Reviewed  CBC WITH DIFFERENTIAL/PLATELET - Abnormal; Notable for the following components:      Result Value   WBC 11.9 (*)     RBC 3.09 (*)    Hemoglobin 8.5 (*)    HCT 28.2 (*)    RDW 17.5 (*)    Neutro Abs 10.9 (*)    Lymphs Abs 0.6 (*)    All other components within normal limits  COMPREHENSIVE METABOLIC PANEL - Abnormal; Notable for the following components:   Chloride 116 (*)    CO2 17 (*)    Glucose, Bld 120 (*)    BUN 34 (*)    Creatinine, Ser 1.20 (*)    Calcium 8.5 (*)    Albumin 3.4 (*)    AST 14 (*)    Alkaline Phosphatase 136 (*)    GFR calc non Af Amer 44 (*)    GFR calc Af Amer 52 (*)    All other components within normal limits  BRAIN NATRIURETIC PEPTIDE - Abnormal; Notable for the following components:   B Natriuretic Peptide 1,213.5 (*)    All other components within normal limits  CBG MONITORING, ED - Abnormal; Notable for the following components:   Glucose-Capillary 125 (*)    All other components within normal limits  TROPONIN I (HIGH SENSITIVITY) - Abnormal; Notable for the following components:   Troponin I (High Sensitivity) 27 (*)    All other components within normal limits  TROPONIN I (HIGH SENSITIVITY) - Abnormal; Notable for the following components:   Troponin I (High Sensitivity) 23 (*)    All other components within normal limits     The plan for this patient was discussed with Dr. Maryan Rued, who voiced agreement.  Patient is hemodynamically stable, in NAD, and able to ambulate in the ED. Evaluation does not show pathology that would require ongoing emergent intervention or inpatient treatment. I have personally reviewed and interpreted all lab work and imaging at today's ED visit. I explained the diagnosis to the patient. Pain has been managed and has no complaints prior to discharge. Patient is comfortable with above plan and is stable for discharge at this time. All questions were answered prior to disposition. Strict return precautions for returning to the ED were discussed. Encouraged follow up with PCP.   An After Visit Summary was printed and given to the  patient.   Portions of this note were generated with [employer]obby[employer]. Dictation errors may occur despite best attempts at proofreading.    Delia Heady, PA-C 01/31/20 [place], MD 02/03/20 [phone]

## 2020-01-31 NOTE — ED Notes (Signed)
Pt c/o ongoing back pain and a headache 6/10 pain.  EDP Hina made aware.

## 2020-01-31 NOTE — ED Notes (Signed)
Pt ambulatory with cane to bathroom, one person assistance.

## 2020-01-31 NOTE — ED Notes (Signed)
Patient ambulated around the room and O2 sat remained at 99-100%

## 2020-01-31 NOTE — ED Provider Notes (Signed)
[facility] DEPT Provider Note   CSN: [id number] Arrival date & time: 01/31/20  0446     History Chief Complaint  Patient presents with  . Shortness of Breath    [name] is a 74 y.o. female presenting for evaluation of shortness of breath.  Patient states she has been feeling more short of breath since yesterday afternoon.  She reports she is coughing more often than normal.  Patient states this feels consistent with her previous COPD exacerbations.  She ran out of her daily preventative medicine yesterday, and has not used any of her albuterol recently.  She denies fevers, chills, chest pain, nausea, vomiting, abd pain.  She is scheduled to get her first Covid vaccine in several days.   Additional history obtained from chart review.  Patient with a history of anxiety, asthma, CKD, chronic pain, COPD, CAD, DDD, DVT, fibromyalgia, GERD, Graves', hypertension, Parkinson's, PUD, CHF, anemia, diabetes.  HPI     Past Medical History:  Diagnosis Date  . Anxiety and depression   . Arthritis   . Asthma   . Carotid artery occlusion   . Chronic kidney disease   . Chronic pain    leg and feet  . COPD (chronic obstructive pulmonary disease) ([place])   . Coronary artery disease   . DDD (degenerative disc disease)   . Diarrhea    chronic   . DVT (deep venous thrombosis) ([place])   . Dyspnea   . Fibromyalgia   . GERD (gastroesophageal reflux disease)   . Grave's disease   . Headache   . HOH (hard of hearing)   . Hypertension   . OP (osteoporosis)   . Parkinson's disease ([place])   . Peripheral vascular disease ([place])   . PUD (peptic ulcer disease)   . Recurrent upper respiratory infection (URI)   . Rhinitis   . Sleep apnea   . Spinal stenosis of lumbar region 04/23/2013  . Thrombophlebitis   . Tobacco use disorder 04/23/2013  . Vitamin B 12 deficiency     Patient Active Problem List   Diagnosis Date Noted  . Malnutrition of moderate degree  12/24/2019  . Acute on chronic diastolic CHF (congestive heart failure) ([place]) 12/21/2019  . CKD (chronic kidney disease) stage 3, GFR 30-59 ml/min 12/21/2019  . Hypertensive urgency 11/20/2019  . Flash pulmonary edema ([place])   . Positive D dimer   . Acute respiratory failure with hypercapnia ([place]) 11/04/2019  . Acute exacerbation of CHF (congestive heart failure) ([place]) 11/04/2019  . DNR (do not resuscitate) 11/04/2019  . Chronic pain syndrome 11/04/2019  . Pulmonary nodule, left 08/02/2018  . Major depressive disorder, single episode, moderate ([place]) 08/02/2018  . Benzodiazepine overdose of undetermined intent 08/01/2018  . Cellulitis 05/14/2018  . Anemia 05/14/2018  . At risk for adverse drug event 10/17/2017  . Salicylate intoxication, undetermined intent, subsequent encounter 10/10/2017  . Acute renal failure with acute renal cortical necrosis superimposed on stage 3a chronic kidney disease ([place]) 10/10/2017  . Headache 10/10/2017  . Severe protein-calorie malnutrition ([place]) 01/26/2016  . Opiate overdose ([place]) 01/25/2016  . Suicide attempt ([place]) 01/25/2016  . Depression   . Dyslipidemia   . Gastroesophageal reflux disease without esophagitis   . COPD (chronic obstructive pulmonary disease) ([place]) 07/29/2015  . Diabetes mellitus with neurological manifestation ([place]) 07/29/2015  . Frequent falls 07/29/2015  . HTN (hypertension) 07/29/2015  . AKI (acute kidney injury) ([place]) 07/29/2015  . SIRS (systemic inflammatory response syndrome) ([place]) 07/29/2015  .  Spinal stenosis of lumbar region 04/23/2013  . Tremor due to multiple drugs 04/23/2013  . Tobacco use disorder 04/23/2013  . COPD exacerbation ([place]) 04/23/2013  . Occlusion and stenosis of carotid artery without mention of cerebral infarction 03/12/2012  . Viral bronchitis-possible h. infl vs Norovirus 11/21/2011  . Pleuritic chest pain 09/18/2011    Past Surgical History:  Procedure Laterality Date  . CAROTID ENDARTERECTOMY Left  Sept. 20,2011   cea  . CHOLECYSTECTOMY  1999  . GASTRECTOMY     age 86   Part of small intestin and part of stomach  . LEFT HEART CATHETERIZATION WITH CORONARY ANGIOGRAM N/A 10/02/2011   Procedure: LEFT HEART CATHETERIZATION WITH CORONARY ANGIOGRAM;  Surgeon: Sueanne Margarita, MD;  Location: [facility] CATH [facility];  Service: Cardiovascular;  Laterality: N/A;     OB History   No obstetric history on file.     Family History  Problem Relation Age of Onset  . Diabetes Mother   . Hyperlipidemia Mother   . Heart attack Mother   . Other Mother        varicose veins,respiratory,stroke  . Heart disease Mother        before age 32  . Hypertension Mother   . Varicose Veins Mother   . Diabetes Father   . Heart disease Father        before age 33  . Hyperlipidemia Father   . Heart attack Father   . Other Father        varicose veins  . Hypertension Father   . Varicose Veins Father   . Diabetes Sister   . Heart disease Sister        before age 41  . Hyperlipidemia Sister   . Heart attack Sister   . Other Sister        varicose veins  . Hypertension Sister   . Varicose Veins Sister   . Peripheral vascular disease Sister   . Diabetes Sister   . Hyperlipidemia Sister   . Hypertension Sister   . Varicose Veins Sister     Social History   Tobacco Use  . Smoking status: Current Every Day Smoker    Packs/day: 2.00    Years: 50.00    Pack years: 100.00    Types: Cigarettes  . Smokeless tobacco: Never Used  . Tobacco comment: cessation info given and reviewed  Substance Use Topics  . Alcohol use: No    Alcohol/week: 0.0 standard drinks  . Drug use: No    Home Medications Prior to Admission medications   Medication Sig Start Date End Date Taking? Authorizing Provider  albuterol (PROVENTIL) (2.5 MG/3ML) 0.083% nebulizer solution Inhale 3 mLs into the lungs 2 (two) times daily. 11/09/19   Regalado, Belkys A, MD  albuterol (VENTOLIN HFA) 108 (90 Base) MCG/ACT inhaler Inhale 2 puffs into  the lungs every 6 (six) hours as needed for wheezing or shortness of breath. 11/09/19   Regalado, Belkys A, MD  allopurinol (ZYLOPRIM) 300 MG tablet Take 1 tablet (300 mg total) by mouth daily. 11/09/19   Regalado, Belkys A, MD  amLODipine (NORVASC) 10 MG tablet Take 1 tablet (10 mg total) by mouth daily. 11/09/19   Regalado, Belkys A, MD  amoxicillin-clavulanate (AUGMENTIN) 500-125 MG tablet Take 1 tablet (500 mg total) by mouth every 12 (twelve) hours. 12/25/19   Charlynne Cousins, MD  atorvastatin (LIPITOR) 20 MG tablet Take 1 tablet (20 mg total) by mouth at bedtime. 11/09/19   Regalado, Cassie Freer, MD  azithromycin (ZITHROMAX) 500 MG tablet Take 1 tablet (500 mg total) by mouth daily. 12/25/19   Charlynne Cousins, MD  CALCIUM PO Take 1 tablet by mouth daily.    [provider]  diphenhydrAMINE (BENADRYL) 25 mg capsule Take 25 mg by mouth every 6 (six) hours as needed for allergies.    [provider]  DULoxetine (CYMBALTA) 60 MG capsule Take 60 mg by mouth 2 (two) times daily. 10/13/19   [provider]  furosemide (LASIX) 40 MG tablet Take 1 tablet (40 mg total) by mouth daily. Patient taking differently: Take 40 mg by mouth daily as needed for fluid.  11/24/19 12/24/19  Arrien, Jimmy Picket, MD  levorphanol (LEVODROMORAN) 2 MG tablet Take 2 mg by mouth 2 (two) times daily. 07/07/18   [provider]  metoprolol succinate (TOPROL-XL) 50 MG 24 hr tablet Take 1 tablet (50 mg total) by mouth daily. Take with or immediately following a meal. 11/10/19   Regalado, Belkys A, MD  Multiple Vitamin (MULTIVITAMIN) capsule Take 1 capsule by mouth daily.    [provider]  pantoprazole (PROTONIX) 40 MG tablet Take 40 mg by mouth 2 (two) times daily. 10/28/19   [provider]  umeclidinium bromide (INCRUSE ELLIPTA) 62.5 MCG/INH AEPB Inhale 1 puff into the lungs daily. 11/10/19   Regalado, Cassie Freer, MD    Allergies    Patient has no known  allergies.  Review of Systems   Review of Systems  Respiratory: Positive for shortness of breath.   All other systems reviewed and are negative.   Physical Exam Updated Vital Signs BP (!) 181/76   Pulse 97   Temp 98.2 F (36.8 C) (Rectal)   Resp (!) 24   Ht 5\' 4"  (1.626 m)   Wt 48.5 kg   SpO2 94%   BMI 18.37 kg/m   Physical Exam Vitals and nursing note reviewed.  Constitutional:      General: She is not in acute distress.    Appearance: She is well-developed.     Comments: Resting comfortably in NAD  HENT:     Head: Normocephalic and atraumatic.  Eyes:     Conjunctiva/sclera: Conjunctivae normal.     Pupils: Pupils are equal, round, and reactive to light.  Cardiovascular:     Rate and Rhythm: Normal rate and regular rhythm.     Pulses: Normal pulses.  Pulmonary:     Effort: Pulmonary effort is normal. No respiratory distress.     Breath sounds: No wheezing.     Comments: Mildly diminished lung sound in L Lower base. Good air movement otherwise. Speaking in full sentences. Spo2 stable on RA Abdominal:     General: There is no distension.     Palpations: Abdomen is soft. There is no mass.     Tenderness: There is no abdominal tenderness. There is no guarding or rebound.  Musculoskeletal:        General: Normal range of motion.     Cervical back: Normal range of motion and neck supple.     Right lower leg: No edema.     Left lower leg: No edema.  Skin:    General: Skin is warm and dry.     Capillary Refill: Capillary refill takes less than 2 seconds.  Neurological:     Mental Status: She is alert and oriented to person, place, and time.     ED Results / Procedures / Treatments   Labs (all labs ordered are listed, but  only abnormal results are displayed) Labs Reviewed  CBC WITH DIFFERENTIAL/PLATELET - Abnormal; Notable for the following components:      Result Value   WBC 11.9 (*)    RBC 3.09 (*)    Hemoglobin 8.5 (*)    HCT 28.2 (*)    RDW 17.5 (*)     Neutro Abs 10.9 (*)    Lymphs Abs 0.6 (*)    All other components within normal limits  CBG MONITORING, ED - Abnormal; Notable for the following components:   Glucose-Capillary 125 (*)    All other components within normal limits  COMPREHENSIVE METABOLIC PANEL  BRAIN NATRIURETIC PEPTIDE  TROPONIN I (HIGH SENSITIVITY)    EKG None  Radiology DG Chest 2 View  Result Date: 01/31/2020 CLINICAL DATA:  COPD exacerbation. EXAM: CHEST - 2 VIEW COMPARISON:  12/22/2019 FINDINGS: Left lung base opacity, similar to the prior exam, consistent with a small pleural effusion and associated lung base atelectasis or scarring. Lungs are hyperexpanded. There are chronic bronchitic changes at both lung bases, as well as bilateral interstitial thickening, findings are stable from the prior study. There is a nodular opacity that projects over the medial left upper lobe, which may be due to a combination of superimposed osseous structures, but raises possibility of a true lung nodule. This was not evident previously. No pneumothorax. Cardiac silhouette is normal in size. No mediastinal or hilar masses or evidence of adenopathy. Skeletal structures are intact. IMPRESSION: 1. No convincing acute cardiopulmonary disease. 2. There are significant chronic findings including left lung base opacity that is consistent with a combination of pleural fluid and atelectasis or scarring, bilateral basilar bronchitic changes bilateral interstitial thickening. Lungs are hyperexpanded consistent with COPD. 3. Possible nodule in the medial left upper lobe. Recommend follow-up unenhanced chest CT on a nonemergent basis for further assessment. Electronically Signed   By: Lajean Manes M.D.   On: 01/31/2020 06:00    Procedures Procedures (including critical care time)  Medications Ordered in ED Medications  ipratropium-albuterol (DUONEB) 0.5-2.5 (3) MG/3ML nebulizer solution 3 mL (3 mLs Nebulization Given 01/31/20 0610)    ED Course  I  have reviewed the triage vital signs and the nursing notes.  Pertinent labs & imaging results that were available during my care of the patient were reviewed by me and considered in my medical decision making (see chart for details).    MDM Rules/Calculators/A&P                      Patient presenting for evaluation of shortness of breath.  On exam, patient appears nontoxic.  She has mildly diminished lung sounds in left lower base, but overall good air movement without significant wheezing.  No fevers to indicate infection.  Patient does not appear fluid overloaded.  Will obtain labs to assess for fluid status, infection, and ACS.  Chest x-ray ordered to rule out pneumonia.  Chest x-ray viewed interpreted by me, similar to previous.  Per radiology, shows chronic left lower lobe opacity.  Per radiology, there is a pulmonary nodule noted of the left upper lobe, recommends nonemergent CT follow-up.  Labs interpreted by me, mild leukocytosis of 11.  Hemoglobin stable at 8.5.  Remaining labs are pending. Of note, pt has HTN. She has not had her home meds today, likely the cause for her HTN  Patient signed out to Mal Misty, PA-C for f/u on labs. If reassuring, and pt ambulates without hypoxia, plan for d/c with PCP f/u.  Final Clinical Impression(s) / ED Diagnoses Final diagnoses:  None    Rx / DC Orders ED Discharge Orders    None       Franchot Heidelberg, PA-C 01/31/20 0701    Molpus, Jenny Reichmann, MD 01/31/20 [phone]

## 2020-01-31 NOTE — ED Notes (Signed)
Pt reports unable to contact friend/family to come pick her up.  Pt reports she would like to take a taxi home.  Pt wheeled to main area of hospital to access an ATM for cab fare.   Cab will be called for pt.

## 2020-01-31 NOTE — Discharge Instructions (Addendum)
Continue taking home medications as prescribed. Follow-up with your primary care doctor if your symptoms not improving. Your x-ray today shows a shadow in the left upper lung.  It is recommended that you follow-up with a CT of your chest for further evaluation.  This can be ordered by your primary care. Return to the emergency room if you develop fevers, severe worsening chest pain, increased difficulty breathing, any new, worsening, concerning symptoms.

## 2020-02-01 ENCOUNTER — Encounter (HOSPITAL_COMMUNITY): Payer: Self-pay

## 2020-02-01 ENCOUNTER — Emergency Department (HOSPITAL_COMMUNITY)
Admission: EM | Admit: 2020-02-01 | Discharge: 2020-02-01 | Disposition: A | Discharge status: Home / Self Care | Payer: Medicare HMO | Source: Home / Self Care | Attending: Emergency Medicine | Admitting: Emergency Medicine

## 2020-02-01 ENCOUNTER — Emergency Department (HOSPITAL_COMMUNITY): Payer: Medicare HMO

## 2020-02-01 DIAGNOSIS — Z79899 Other long term (current) drug therapy: Secondary | ICD-10-CM | POA: Insufficient documentation

## 2020-02-01 DIAGNOSIS — J441 Chronic obstructive pulmonary disease with (acute) exacerbation: Secondary | ICD-10-CM

## 2020-02-01 DIAGNOSIS — R042 Hemoptysis: Secondary | ICD-10-CM | POA: Insufficient documentation

## 2020-02-01 DIAGNOSIS — N289 Disorder of kidney and ureter, unspecified: Secondary | ICD-10-CM

## 2020-02-01 DIAGNOSIS — I130 Hypertensive heart and chronic kidney disease with heart failure and stage 1 through stage 4 chronic kidney disease, or unspecified chronic kidney disease: Secondary | ICD-10-CM | POA: Insufficient documentation

## 2020-02-01 DIAGNOSIS — I5032 Chronic diastolic (congestive) heart failure: Secondary | ICD-10-CM | POA: Insufficient documentation

## 2020-02-01 DIAGNOSIS — F17210 Nicotine dependence, cigarettes, uncomplicated: Secondary | ICD-10-CM | POA: Insufficient documentation

## 2020-02-01 DIAGNOSIS — N1831 Chronic kidney disease, stage 3a: Secondary | ICD-10-CM | POA: Insufficient documentation

## 2020-02-01 DIAGNOSIS — D649 Anemia, unspecified: Secondary | ICD-10-CM

## 2020-02-01 DIAGNOSIS — G20 Parkinson's disease: Secondary | ICD-10-CM | POA: Insufficient documentation

## 2020-02-01 LAB — BASIC METABOLIC PANEL
Anion gap: 14 (ref 5–15)
BUN: 34 mg/dL — ABNORMAL HIGH (ref 8–23)
CO2: 15 mmol/L — ABNORMAL LOW (ref 22–32)
Calcium: 8.5 mg/dL — ABNORMAL LOW (ref 8.9–10.3)
Chloride: 114 mmol/L — ABNORMAL HIGH (ref 98–111)
Creatinine, Ser: 1.46 mg/dL — ABNORMAL HIGH (ref 0.44–1.00)
GFR calc Af Amer: 41 mL/min — ABNORMAL LOW (ref >60)
GFR calc non Af Amer: 35 mL/min — ABNORMAL LOW (ref >60)
Glucose, Bld: 110 mg/dL — ABNORMAL HIGH (ref 70–99)
Potassium: 4.3 mmol/L (ref 3.5–5.1)
Sodium: 143 mmol/L (ref 135–145)

## 2020-02-01 LAB — CBC WITH DIFFERENTIAL/PLATELET
Abs Immature Granulocytes: 0.05 10*3/uL (ref 0.00–0.07)
Basophils Absolute: 0 10*3/uL (ref 0.0–0.1)
Basophils Relative: 0 %
Eosinophils Absolute: 0.1 10*3/uL (ref 0.0–0.5)
Eosinophils Relative: 1 %
HCT: 31.5 % — ABNORMAL LOW (ref 36.0–46.0)
Hemoglobin: 8.9 g/dL — ABNORMAL LOW (ref 12.0–15.0)
Immature Granulocytes: 0 %
Lymphocytes Relative: 24 %
Lymphs Abs: 2.9 10*3/uL (ref 0.7–4.0)
MCH: 26.6 pg (ref 26.0–34.0)
MCHC: 28.3 g/dL — ABNORMAL LOW (ref 30.0–36.0)
MCV: 94.3 fL (ref 80.0–100.0)
Monocytes Absolute: 0.9 10*3/uL (ref 0.1–1.0)
Monocytes Relative: 7 %
Neutro Abs: 8.2 10*3/uL — ABNORMAL HIGH (ref 1.7–7.7)
Neutrophils Relative %: 68 %
Platelets: 408 10*3/uL — ABNORMAL HIGH (ref 150–400)
RBC: 3.34 MIL/uL — ABNORMAL LOW (ref 3.87–5.11)
RDW: 17.8 % — ABNORMAL HIGH (ref 11.5–15.5)
WBC: 12.2 10*3/uL — ABNORMAL HIGH (ref 4.0–10.5)
nRBC: 0 % (ref 0.0–0.2)

## 2020-02-01 LAB — BRAIN NATRIURETIC PEPTIDE: B Natriuretic Peptide: 1560.5 pg/mL — ABNORMAL HIGH (ref 0.0–100.0)

## 2020-02-01 MED ORDER — ACETAMINOPHEN 325 MG PO TABS
650.0000 mg | ORAL_TABLET | Freq: Once | ORAL | Status: AC
  Administered 2020-02-01: 09:00:00 650 mg via ORAL
  Filled 2020-02-01: qty 2

## 2020-02-01 MED ORDER — ALBUTEROL SULFATE HFA 108 (90 BASE) MCG/ACT IN AERS
2.0000 | INHALATION_SPRAY | RESPIRATORY_TRACT | Status: DC | PRN
  Filled 2020-02-01: qty 6.7

## 2020-02-01 MED ORDER — PREDNISONE 50 MG PO TABS: 50.0000 mg | ORAL_TABLET | Freq: Every day | ORAL | 0 refills | Status: DC

## 2020-02-01 MED ORDER — IPRATROPIUM-ALBUTEROL 0.5-2.5 (3) MG/3ML IN SOLN
3.0000 mL | Freq: Once | RESPIRATORY_TRACT | Status: AC
  Administered 2020-02-01: 3 mL via RESPIRATORY_TRACT
  Filled 2020-02-01: qty 3

## 2020-02-01 MED ORDER — PREDNISONE 20 MG PO TABS
60.0000 mg | ORAL_TABLET | Freq: Once | ORAL | Status: AC
  Administered 2020-02-01: 06:00:00 60 mg via ORAL
  Filled 2020-02-01: qty 3

## 2020-02-01 NOTE — ED Notes (Signed)
I tried to get pt the ambulate in room pt stood up beside bed and stated that she is too weak to walk right now. Pt stated prior to getting up that her legs are hurting and that her back is killing her.

## 2020-02-01 NOTE — ED Notes (Signed)
Pt sitting in waiting room awaiting Case Management.  Taxi voucher given.

## 2020-02-01 NOTE — Discharge Instructions (Addendum)
Use the albuterol inhaler as needed.  Yesterday, you were given a prescription for Incruse Ellipta - please pick it up when you pick up your prescription for prednisone, and start using it as directed.  Return if you are having any problems.

## 2020-02-01 NOTE — Progress Notes (Signed)
Patient needed smaller walker as doorways in house were too big. In speaking to patient, she has meals on wheels,. She has someone living with her, but he drives a truck and is away often. She does not eat well during those times. She is requesting a consult for assisted living. Home health will be consulted to assist with placement..needs

## 2020-02-01 NOTE — Discharge Planning (Signed)
RNCM e=met with pt in lobby prior to discharge to inform her that she is not eligible through insurance for new rolling walker until October 2021.  RNCM informed pt of option to purchase through [facility] for $99.00, pt declined.  RNCM offered pt list of home health agencies to choose from.  Pt chose [facility] to render services. [facility] notified. Patient made aware that [facility] will be in contact in 24-48 hours.  No DME needs identified at this time.

## 2020-02-01 NOTE — ED Triage Notes (Signed)
Pt came in [facility] from home with the c/o of SHOB. Per EMS she used her albuterol inhaler that did not help. EMS applied CPAP to manage an Spo2 of 100%. EMS gave 1 DuoNeb, 125mg  SoluMed, 2mg  Mag., and .3mg  epi PTA. EMS placed a 20GIVLFA. Pt is A/Ox4

## 2020-02-01 NOTE — ED Provider Notes (Signed)
[facility] EMERGENCY DEPARTMENT Provider Note   CSN: [id number] Arrival date & time: 02/01/20  0458   History Chief Complaint  Patient presents with  . Shortness of Breath    [name] is a 74 y.o. female.  The history is provided by the patient.  Shortness of Breath She has history of COPD, chronic kidney disease, hypertension, diabetes and comes in because of difficulty breathing which started yesterday afternoon.  She states that she had run out of the inhalers that she uses at home.  There is a chronic cough which is occasionally productive of a small amount of blood-streaked sputum, but this has not changed.  She denies fever or chills or sweats.  She denies chest pain, heaviness, tightness, pressure.  Dyspnea is not affected by body position.  She was seen in the emergency department yesterday with similar complaints and states she was feeling somewhat better at time of discharge.  Past Medical History:  Diagnosis Date  . Anxiety and depression   . Arthritis   . Asthma   . Carotid artery occlusion   . Chronic kidney disease   . Chronic pain    leg and feet  . COPD (chronic obstructive pulmonary disease) ([place])   . Coronary artery disease   . DDD (degenerative disc disease)   . Diarrhea    chronic   . DVT (deep venous thrombosis) ([place])   . Dyspnea   . Fibromyalgia   . GERD (gastroesophageal reflux disease)   . Grave's disease   . Headache   . HOH (hard of hearing)   . Hypertension   . OP (osteoporosis)   . Parkinson's disease ([place])   . Peripheral vascular disease ([place])   . PUD (peptic ulcer disease)   . Recurrent upper respiratory infection (URI)   . Rhinitis   . Sleep apnea   . Spinal stenosis of lumbar region 04/23/2013  . Thrombophlebitis   . Tobacco use disorder 04/23/2013  . Vitamin B 12 deficiency     Patient Active Problem List   Diagnosis Date Noted  . Malnutrition of moderate degree 12/24/2019  . Acute on chronic diastolic  CHF (congestive heart failure) ([place]) 12/21/2019  . CKD (chronic kidney disease) stage 3, GFR 30-59 ml/min 12/21/2019  . Hypertensive urgency 11/20/2019  . Flash pulmonary edema ([place])   . Positive D dimer   . Acute respiratory failure with hypercapnia ([place]) 11/04/2019  . Acute exacerbation of CHF (congestive heart failure) ([place]) 11/04/2019  . DNR (do not resuscitate) 11/04/2019  . Chronic pain syndrome 11/04/2019  . Pulmonary nodule, left 08/02/2018  . Major depressive disorder, single episode, moderate ([place]) 08/02/2018  . Benzodiazepine overdose of undetermined intent 08/01/2018  . Cellulitis 05/14/2018  . Anemia 05/14/2018  . At risk for adverse drug event 10/17/2017  . Salicylate intoxication, undetermined intent, subsequent encounter 10/10/2017  . Acute renal failure with acute renal cortical necrosis superimposed on stage 3a chronic kidney disease ([place]) 10/10/2017  . Headache 10/10/2017  . Severe protein-calorie malnutrition ([place]) 01/26/2016  . Opiate overdose ([place]) 01/25/2016  . Suicide attempt ([place]) 01/25/2016  . Depression   . Dyslipidemia   . Gastroesophageal reflux disease without esophagitis   . COPD (chronic obstructive pulmonary disease) ([place]) 07/29/2015  . Diabetes mellitus with neurological manifestation ([place]) 07/29/2015  . Frequent falls 07/29/2015  . HTN (hypertension) 07/29/2015  . AKI (acute kidney injury) ([place]) 07/29/2015  . SIRS (systemic inflammatory response syndrome) ([place]) 07/29/2015  . Spinal stenosis of lumbar region  04/23/2013  . Tremor due to multiple drugs 04/23/2013  . Tobacco use disorder 04/23/2013  . COPD exacerbation ([place]) 04/23/2013  . Occlusion and stenosis of carotid artery without mention of cerebral infarction 03/12/2012  . Viral bronchitis-possible h. infl vs Norovirus 11/21/2011  . Pleuritic chest pain 09/18/2011    Past Surgical History:  Procedure Laterality Date  . CAROTID ENDARTERECTOMY Left Sept. 20,2011   cea  . CHOLECYSTECTOMY   1999  . GASTRECTOMY     age 9   Part of small intestin and part of stomach  . LEFT HEART CATHETERIZATION WITH CORONARY ANGIOGRAM N/A 10/02/2011   Procedure: LEFT HEART CATHETERIZATION WITH CORONARY ANGIOGRAM;  Surgeon: Sueanne Margarita, MD;  Location: [facility];  Service: Cardiovascular;  Laterality: N/A;     OB History   No obstetric history on file.     Family History  Problem Relation Age of Onset  . Diabetes Mother   . Hyperlipidemia Mother   . Heart attack Mother   . Other Mother        varicose veins,respiratory,stroke  . Heart disease Mother        before age 12  . Hypertension Mother   . Varicose Veins Mother   . Diabetes Father   . Heart disease Father        before age 5  . Hyperlipidemia Father   . Heart attack Father   . Other Father        varicose veins  . Hypertension Father   . Varicose Veins Father   . Diabetes Sister   . Heart disease Sister        before age 54  . Hyperlipidemia Sister   . Heart attack Sister   . Other Sister        varicose veins  . Hypertension Sister   . Varicose Veins Sister   . Peripheral vascular disease Sister   . Diabetes Sister   . Hyperlipidemia Sister   . Hypertension Sister   . Varicose Veins Sister     Social History   Tobacco Use  . Smoking status: Current Every Day Smoker    Packs/day: 2.00    Years: 50.00    Pack years: 100.00    Types: Cigarettes  . Smokeless tobacco: Never Used  . Tobacco comment: cessation info given and reviewed  Substance Use Topics  . Alcohol use: No    Alcohol/week: 0.0 standard drinks  . Drug use: No    Home Medications Prior to Admission medications   Medication Sig Start Date End Date Taking? Authorizing Provider  albuterol (PROVENTIL) (2.5 MG/3ML) 0.083% nebulizer solution Inhale 3 mLs into the lungs 2 (two) times daily. 11/09/19   Regalado, Belkys A, MD  albuterol (VENTOLIN HFA) 108 (90 Base) MCG/ACT inhaler Inhale 2 puffs into the lungs every 6 (six) hours as needed  for wheezing or shortness of breath. 11/09/19   Regalado, Belkys A, MD  allopurinol (ZYLOPRIM) 300 MG tablet Take 1 tablet (300 mg total) by mouth daily. 11/09/19   Regalado, Belkys A, MD  amLODipine (NORVASC) 10 MG tablet Take 1 tablet (10 mg total) by mouth daily. 11/09/19   Regalado, Belkys A, MD  amoxicillin-clavulanate (AUGMENTIN) 500-125 MG tablet Take 1 tablet (500 mg total) by mouth every 12 (twelve) hours. 12/25/19   Charlynne Cousins, MD  atorvastatin (LIPITOR) 20 MG tablet Take 1 tablet (20 mg total) by mouth at bedtime. 11/09/19   Regalado, Belkys A, MD  azithromycin (ZITHROMAX) 500 MG  tablet Take 1 tablet (500 mg total) by mouth daily. 12/25/19   Charlynne Cousins, MD  CALCIUM PO Take 1 tablet by mouth daily.    [provider]  diphenhydrAMINE (BENADRYL) 25 mg capsule Take 25 mg by mouth every 6 (six) hours as needed for allergies.    [provider]  DULoxetine (CYMBALTA) 20 MG capsule Take 20 mg by mouth daily. 12/21/19   [provider]  DULoxetine (CYMBALTA) 60 MG capsule Take 60 mg by mouth 2 (two) times daily. 10/13/19   [provider]  furosemide (LASIX) 40 MG tablet Take 1 tablet (40 mg total) by mouth daily. Patient taking differently: Take 40 mg by mouth daily as needed for fluid.  11/24/19 12/24/19  Arrien, Jimmy Picket, MD  levorphanol (LEVODROMORAN) 2 MG tablet Take 2 mg by mouth 2 (two) times daily. 07/07/18   [provider]  metoprolol succinate (TOPROL-XL) 25 MG 24 hr tablet Take 25 mg by mouth daily. 12/21/19   [provider]  metoprolol succinate (TOPROL-XL) 50 MG 24 hr tablet Take 1 tablet (50 mg total) by mouth daily. Take with or immediately following a meal. 11/10/19   Regalado, Belkys A, MD  Multiple Vitamin (MULTIVITAMIN) capsule Take 1 capsule by mouth daily.    [provider]  pantoprazole (PROTONIX) 40 MG tablet Take 40 mg by mouth 2 (two) times daily. 10/28/19   [provider]    potassium chloride SA (KLOR-CON) 20 MEQ tablet Take 20 mEq by mouth daily. 12/21/19   [provider]  umeclidinium bromide (INCRUSE ELLIPTA) 62.5 MCG/INH AEPB Inhale 1 puff into the lungs daily. 01/31/20   Caccavale, Sophia, PA-C    Allergies    Patient has no known allergies.  Review of Systems   Review of Systems  Respiratory: Positive for shortness of breath.   All other systems reviewed and are negative.   Physical Exam Updated Vital Signs BP (!) 142/108   Pulse 95   Temp (!) 97.4 F (36.3 C) (Oral)   Resp (!) 21   Ht 5\' 4"  (1.626 m)   Wt 48.5 kg   SpO2 100%   BMI 18.35 kg/m   Physical Exam Vitals and nursing note reviewed.   74 year old female, resting comfortably and in no acute distress. Vital signs are significant for elevated blood pressure and respiratory rate. Oxygen saturation is 100%, which is normal. Head is normocephalic and atraumatic. PERRLA, EOMI. Oropharynx is clear. Neck is nontender and supple without adenopathy or JVD. Back is nontender and there is no CVA tenderness. Lungs decreased air movement bilaterally with faint expiratory wheezes diffusely.  There are no rales or rhonchi. Chest is nontender. Heart has regular rate and rhythm without murmur. Abdomen is soft, flat, nontender without masses or hepatosplenomegaly and peristalsis is normoactive. Extremities have no cyanosis or edema, full range of motion is present. Skin is warm and dry without rash. Neurologic: Mental status is normal, cranial nerves are intact, there are no motor or sensory deficits.  ED Results / Procedures / Treatments   Labs (all labs ordered are listed, but only abnormal results are displayed) Labs Reviewed  CBC WITH DIFFERENTIAL/PLATELET - Abnormal; Notable for the following components:      Result Value   WBC 12.2 (*)    RBC 3.34 (*)    Hemoglobin 8.9 (*)    HCT 31.5 (*)    MCHC 28.3 (*)    RDW 17.8 (*)    Platelets 408 (*)  Neutro Abs 8.2 (*)    All  other components within normal limits  BASIC METABOLIC PANEL - Abnormal; Notable for the following components:   Chloride 114 (*)    CO2 15 (*)    Glucose, Bld 110 (*)    BUN 34 (*)    Creatinine, Ser 1.46 (*)    Calcium 8.5 (*)    GFR calc non Af Amer 35 (*)    GFR calc Af Amer 41 (*)    All other components within normal limits  BRAIN NATRIURETIC PEPTIDE    EKG EKG Interpretation  Date/Time:  Monday February 01 2020 05:00:20 EDT Ventricular Rate:  94 PR Interval:    QRS Duration: 92 QT Interval:  383 QTC Calculation: 479 R Axis:   -2 Text Interpretation: Sinus rhythm Probable left atrial enlargement Probable LVH with secondary repol abnrm When compared with ECG of 01/31/2020, No significant change was found Confirmed by Delora Fuel ([id number]) on 02/01/2020 5:16:10 AM   Radiology DG Chest 2 View  Result Date: 02/01/2020 CLINICAL DATA:  Shortness of breath. EXAM: CHEST - 2 VIEW COMPARISON:  01/31/2020 FINDINGS: The cardiac silhouette, mediastinal and hilar contours are stable. Stable underlying chronic lung disease with emphysema and pulmonary scarring. Persistent left pleural effusion and overlying atelectasis and scarring. Stable vague rounded density in the left upper lobe. No definite acute pulmonary findings. IMPRESSION: Chronic lung disease with persistent left pleural effusion and overlying atelectasis and scarring. No definite acute overlying pulmonary process. Electronically Signed   By: Marijo Sanes M.D.   On: 02/01/2020 05:37   DG Chest 2 View  Result Date: 01/31/2020 CLINICAL DATA:  COPD exacerbation. EXAM: CHEST - 2 VIEW COMPARISON:  12/22/2019 FINDINGS: Left lung base opacity, similar to the prior exam, consistent with a small pleural effusion and associated lung base atelectasis or scarring. Lungs are hyperexpanded. There are chronic bronchitic changes at both lung bases, as well as bilateral interstitial thickening, findings are stable from the prior study. There is a nodular  opacity that projects over the medial left upper lobe, which may be due to a combination of superimposed osseous structures, but raises possibility of a true lung nodule. This was not evident previously. No pneumothorax. Cardiac silhouette is normal in size. No mediastinal or hilar masses or evidence of adenopathy. Skeletal structures are intact. IMPRESSION: 1. No convincing acute cardiopulmonary disease. 2. There are significant chronic findings including left lung base opacity that is consistent with a combination of pleural fluid and atelectasis or scarring, bilateral basilar bronchitic changes bilateral interstitial thickening. Lungs are hyperexpanded consistent with COPD. 3. Possible nodule in the medial left upper lobe. Recommend follow-up unenhanced chest CT on a nonemergent basis for further assessment. Electronically Signed   By: Lajean Manes M.D.   On: 01/31/2020 06:00    Procedures Procedures  Medications Ordered in ED Medications  albuterol (VENTOLIN HFA) 108 (90 Base) MCG/ACT inhaler 2 puff (has no administration in time range)  acetaminophen (TYLENOL) tablet 650 mg (has no administration in time range)  ipratropium-albuterol (DUONEB) 0.5-2.5 (3) MG/3ML nebulizer solution 3 mL (3 mLs Nebulization Given 02/01/20 0601)  predniSONE (DELTASONE) tablet 60 mg (60 mg Oral Given 02/01/20 0601)    ED Course  I have reviewed the triage vital signs and the nursing notes.  Pertinent labs & imaging results that were available during my care of the patient were reviewed by me and considered in my medical decision making (see chart for details).  COPD exacerbation.  Old records are  reviewed confirming ED visit yesterday.  At that time, BNP was somewhat elevated, but within her baseline and chest x-ray did not show evidence of heart failure.  She was not given any prescriptions on discharge.  As this appears to be a COPD exacerbation, she will be given a dose of prednisone will be given albuterol with  ipratropium.  We will repeat labs.  Chest x-ray done today does not look significantly different from yesterday.  ECG is also unchanged from yesterday.  Following nebulizer treatment, patient states she is feeling much better.  She is resting comfortably.  She is taken off of oxygen and maintaining an adequate oxygen saturation.  Labs show stable anemia and stable renal insufficiency.  It is it is noted that she was given a prescription for Ellipta yesterday, but she did not pick it up.  She is felt to be safe for discharge.  She is given an albuterol inhaler to take home with her and also given prescription for a 5-day course of prednisone.  She is instructed to follow-up with her PCP in the next several days, return precautions discussed.  MDM Rules/Calculators/A&P  Final Clinical Impression(s) / ED Diagnoses Final diagnoses:  COPD exacerbation ([place])  Renal insufficiency  Normochromic normocytic anemia    Rx / DC Orders ED Discharge Orders         Ordered    predniSONE (DELTASONE) 50 MG tablet  Daily     02/01/20 4158           Delora Fuel, MD 30/94/07 [phone]

## 2020-02-01 NOTE — ED Notes (Signed)
Pt would like help to be placed in assisted living.  She is able to ambulate with her cane here in ED, sats remained 97 % on RA.  She was able to ambulate 30 feet before becoming too fatigued.  Case Management came in to see pt RE walker, b/c she states her walker is too big to fit through her doorways at home.  Pt also c/o having difficulty going out and buying food, though she states meals on wheels does come to her home.  Social Work consulted.

## 2020-02-02 ENCOUNTER — Other Ambulatory Visit: Payer: Self-pay

## 2020-02-02 ENCOUNTER — Emergency Department (HOSPITAL_COMMUNITY): Payer: Medicare HMO

## 2020-02-02 ENCOUNTER — Encounter (HOSPITAL_COMMUNITY): Payer: Self-pay

## 2020-02-02 ENCOUNTER — Inpatient Hospital Stay (HOSPITAL_COMMUNITY)
Admission: EM | Admit: 2020-02-02 | Discharge: 2020-02-12 | DRG: 264 | Disposition: A | Discharge status: Skilled Nursing Facility | Payer: Medicare HMO | Attending: Internal Medicine | Admitting: Internal Medicine

## 2020-02-02 DIAGNOSIS — Z83438 Family history of other disorder of lipoprotein metabolism and other lipidemia: Secondary | ICD-10-CM

## 2020-02-02 DIAGNOSIS — I2510 Atherosclerotic heart disease of native coronary artery without angina pectoris: Secondary | ICD-10-CM

## 2020-02-02 DIAGNOSIS — J471 Bronchiectasis with (acute) exacerbation: Secondary | ICD-10-CM | POA: Diagnosis present

## 2020-02-02 DIAGNOSIS — F329 Major depressive disorder, single episode, unspecified: Secondary | ICD-10-CM | POA: Diagnosis present

## 2020-02-02 DIAGNOSIS — E440 Moderate protein-calorie malnutrition: Secondary | ICD-10-CM | POA: Diagnosis present

## 2020-02-02 DIAGNOSIS — F17210 Nicotine dependence, cigarettes, uncomplicated: Secondary | ICD-10-CM | POA: Diagnosis present

## 2020-02-02 DIAGNOSIS — M797 Fibromyalgia: Secondary | ICD-10-CM | POA: Diagnosis present

## 2020-02-02 DIAGNOSIS — Z66 Do not resuscitate: Secondary | ICD-10-CM | POA: Diagnosis present

## 2020-02-02 DIAGNOSIS — I10 Essential (primary) hypertension: Secondary | ICD-10-CM | POA: Diagnosis present

## 2020-02-02 DIAGNOSIS — I5033 Acute on chronic diastolic (congestive) heart failure: Secondary | ICD-10-CM | POA: Diagnosis present

## 2020-02-02 DIAGNOSIS — E1151 Type 2 diabetes mellitus with diabetic peripheral angiopathy without gangrene: Secondary | ICD-10-CM | POA: Diagnosis present

## 2020-02-02 DIAGNOSIS — Z20822 Contact with and (suspected) exposure to covid-19: Secondary | ICD-10-CM | POA: Diagnosis present

## 2020-02-02 DIAGNOSIS — F32A Depression, unspecified: Secondary | ICD-10-CM | POA: Diagnosis present

## 2020-02-02 DIAGNOSIS — Z8711 Personal history of peptic ulcer disease: Secondary | ICD-10-CM

## 2020-02-02 DIAGNOSIS — Z419 Encounter for procedure for purposes other than remedying health state, unspecified: Secondary | ICD-10-CM

## 2020-02-02 DIAGNOSIS — E0500 Thyrotoxicosis with diffuse goiter without thyrotoxic crisis or storm: Secondary | ICD-10-CM | POA: Diagnosis present

## 2020-02-02 DIAGNOSIS — I340 Nonrheumatic mitral (valve) insufficiency: Secondary | ICD-10-CM | POA: Diagnosis present

## 2020-02-02 DIAGNOSIS — G20 Parkinson's disease: Secondary | ICD-10-CM | POA: Diagnosis present

## 2020-02-02 DIAGNOSIS — M1990 Unspecified osteoarthritis, unspecified site: Secondary | ICD-10-CM | POA: Diagnosis present

## 2020-02-02 DIAGNOSIS — Z681 Body mass index (BMI) 19 or less, adult: Secondary | ICD-10-CM

## 2020-02-02 DIAGNOSIS — Z9119 Patient's noncompliance with other medical treatment and regimen: Secondary | ICD-10-CM

## 2020-02-02 DIAGNOSIS — Z833 Family history of diabetes mellitus: Secondary | ICD-10-CM

## 2020-02-02 DIAGNOSIS — Z79899 Other long term (current) drug therapy: Secondary | ICD-10-CM

## 2020-02-02 DIAGNOSIS — G8929 Other chronic pain: Secondary | ICD-10-CM | POA: Diagnosis present

## 2020-02-02 DIAGNOSIS — J939 Pneumothorax, unspecified: Secondary | ICD-10-CM

## 2020-02-02 DIAGNOSIS — E875 Hyperkalemia: Secondary | ICD-10-CM | POA: Diagnosis not present

## 2020-02-02 DIAGNOSIS — N1832 Chronic kidney disease, stage 3b: Secondary | ICD-10-CM | POA: Diagnosis present

## 2020-02-02 DIAGNOSIS — R0602 Shortness of breath: Secondary | ICD-10-CM

## 2020-02-02 DIAGNOSIS — K219 Gastro-esophageal reflux disease without esophagitis: Secondary | ICD-10-CM | POA: Diagnosis present

## 2020-02-02 DIAGNOSIS — I130 Hypertensive heart and chronic kidney disease with heart failure and stage 1 through stage 4 chronic kidney disease, or unspecified chronic kidney disease: Principal | ICD-10-CM | POA: Diagnosis present

## 2020-02-02 DIAGNOSIS — J9601 Acute respiratory failure with hypoxia: Secondary | ICD-10-CM | POA: Diagnosis present

## 2020-02-02 DIAGNOSIS — E1140 Type 2 diabetes mellitus with diabetic neuropathy, unspecified: Secondary | ICD-10-CM

## 2020-02-02 DIAGNOSIS — D631 Anemia in chronic kidney disease: Secondary | ICD-10-CM | POA: Diagnosis present

## 2020-02-02 DIAGNOSIS — T380X5A Adverse effect of glucocorticoids and synthetic analogues, initial encounter: Secondary | ICD-10-CM | POA: Diagnosis not present

## 2020-02-02 DIAGNOSIS — Z9889 Other specified postprocedural states: Secondary | ICD-10-CM

## 2020-02-02 DIAGNOSIS — E1165 Type 2 diabetes mellitus with hyperglycemia: Secondary | ICD-10-CM | POA: Diagnosis not present

## 2020-02-02 DIAGNOSIS — E785 Hyperlipidemia, unspecified: Secondary | ICD-10-CM | POA: Diagnosis present

## 2020-02-02 DIAGNOSIS — R911 Solitary pulmonary nodule: Secondary | ICD-10-CM | POA: Diagnosis present

## 2020-02-02 DIAGNOSIS — H9190 Unspecified hearing loss, unspecified ear: Secondary | ICD-10-CM | POA: Diagnosis present

## 2020-02-02 DIAGNOSIS — E1149 Type 2 diabetes mellitus with other diabetic neurological complication: Secondary | ICD-10-CM | POA: Diagnosis present

## 2020-02-02 DIAGNOSIS — J9383 Other pneumothorax: Secondary | ICD-10-CM

## 2020-02-02 DIAGNOSIS — E538 Deficiency of other specified B group vitamins: Secondary | ICD-10-CM | POA: Diagnosis present

## 2020-02-02 DIAGNOSIS — N179 Acute kidney failure, unspecified: Secondary | ICD-10-CM | POA: Diagnosis not present

## 2020-02-02 DIAGNOSIS — J441 Chronic obstructive pulmonary disease with (acute) exacerbation: Secondary | ICD-10-CM

## 2020-02-02 DIAGNOSIS — Z8249 Family history of ischemic heart disease and other diseases of the circulatory system: Secondary | ICD-10-CM

## 2020-02-02 DIAGNOSIS — G4733 Obstructive sleep apnea (adult) (pediatric): Secondary | ICD-10-CM | POA: Diagnosis present

## 2020-02-02 DIAGNOSIS — Z7951 Long term (current) use of inhaled steroids: Secondary | ICD-10-CM

## 2020-02-02 DIAGNOSIS — R251 Tremor, unspecified: Secondary | ICD-10-CM | POA: Diagnosis present

## 2020-02-02 DIAGNOSIS — M810 Age-related osteoporosis without current pathological fracture: Secondary | ICD-10-CM | POA: Diagnosis present

## 2020-02-02 DIAGNOSIS — J439 Emphysema, unspecified: Secondary | ICD-10-CM | POA: Diagnosis present

## 2020-02-02 LAB — BLOOD GAS, ARTERIAL
Acid-base deficit: 8.8 mmol/L — ABNORMAL HIGH (ref 0.0–2.0)
Bicarbonate: 16.9 mmol/L — ABNORMAL LOW (ref 20.0–28.0)
FIO2: 21
O2 Saturation: 60.5 %
Patient temperature: 97.9
pCO2 arterial: 37.6 mmHg (ref 32.0–48.0)
pH, Arterial: 7.274 — ABNORMAL LOW (ref 7.350–7.450)
pO2, Arterial: 39.6 mmHg — CL (ref 83.0–108.0)

## 2020-02-02 LAB — CBC WITH DIFFERENTIAL/PLATELET
Abs Immature Granulocytes: 0.05 10*3/uL (ref 0.00–0.07)
Basophils Absolute: 0 10*3/uL (ref 0.0–0.1)
Basophils Relative: 0 %
Eosinophils Absolute: 0 10*3/uL (ref 0.0–0.5)
Eosinophils Relative: 0 %
HCT: 28.3 % — ABNORMAL LOW (ref 36.0–46.0)
Hemoglobin: 8.3 g/dL — ABNORMAL LOW (ref 12.0–15.0)
Immature Granulocytes: 1 %
Lymphocytes Relative: 7 %
Lymphs Abs: 0.7 10*3/uL (ref 0.7–4.0)
MCH: 26.8 pg (ref 26.0–34.0)
MCHC: 29.3 g/dL — ABNORMAL LOW (ref 30.0–36.0)
MCV: 91.3 fL (ref 80.0–100.0)
Monocytes Absolute: 0.5 10*3/uL (ref 0.1–1.0)
Monocytes Relative: 5 %
Neutro Abs: 8.3 10*3/uL — ABNORMAL HIGH (ref 1.7–7.7)
Neutrophils Relative %: 87 %
Platelets: 311 10*3/uL (ref 150–400)
RBC: 3.1 MIL/uL — ABNORMAL LOW (ref 3.87–5.11)
RDW: 17.8 % — ABNORMAL HIGH (ref 11.5–15.5)
WBC: 9.5 10*3/uL (ref 4.0–10.5)
nRBC: 0 % (ref 0.0–0.2)

## 2020-02-02 LAB — GLUCOSE, CAPILLARY
Glucose-Capillary: 145 mg/dL — ABNORMAL HIGH (ref 70–99)
Glucose-Capillary: 167 mg/dL — ABNORMAL HIGH (ref 70–99)
Glucose-Capillary: 130 mg/dL — ABNORMAL HIGH (ref 70–99)

## 2020-02-02 LAB — BASIC METABOLIC PANEL
Anion gap: 11 (ref 5–15)
BUN: 39 mg/dL — ABNORMAL HIGH (ref 8–23)
CO2: 16 mmol/L — ABNORMAL LOW (ref 22–32)
Calcium: 8.4 mg/dL — ABNORMAL LOW (ref 8.9–10.3)
Chloride: 114 mmol/L — ABNORMAL HIGH (ref 98–111)
Creatinine, Ser: 1.54 mg/dL — ABNORMAL HIGH (ref 0.44–1.00)
GFR calc Af Amer: 38 mL/min — ABNORMAL LOW (ref >60)
GFR calc non Af Amer: 33 mL/min — ABNORMAL LOW (ref >60)
Glucose, Bld: 124 mg/dL — ABNORMAL HIGH (ref 70–99)
Potassium: 4.1 mmol/L (ref 3.5–5.1)
Sodium: 141 mmol/L (ref 135–145)

## 2020-02-02 LAB — BRAIN NATRIURETIC PEPTIDE: B Natriuretic Peptide: 1305.3 pg/mL — ABNORMAL HIGH (ref 0.0–100.0)

## 2020-02-02 LAB — MRSA PCR SCREENING: MRSA by PCR: NEGATIVE

## 2020-02-02 LAB — PHOSPHORUS: Phosphorus: 4.5 mg/dL (ref 2.5–4.6)

## 2020-02-02 LAB — HEMOGLOBIN A1C
Hgb A1c MFr Bld: 5.6 % (ref 4.8–5.6)
Mean Plasma Glucose: 114.02 mg/dL

## 2020-02-02 LAB — SARS CORONAVIRUS 2 (TAT 6-24 HRS): SARS Coronavirus 2: NEGATIVE

## 2020-02-02 LAB — MAGNESIUM: Magnesium: 2.5 mg/dL — ABNORMAL HIGH (ref 1.7–2.4)

## 2020-02-02 MED ORDER — METHYLPREDNISOLONE SODIUM SUCC 40 MG IJ SOLR
40.0000 mg | Freq: Four times a day (QID) | INTRAMUSCULAR | Status: AC
  Administered 2020-02-02 – 2020-02-03 (×4): 40 mg via INTRAVENOUS
  Filled 2020-02-02 (×4): qty 1

## 2020-02-02 MED ORDER — ACETAMINOPHEN 650 MG RE SUPP
650.0000 mg | Freq: Four times a day (QID) | RECTAL | Status: DC | PRN
  Filled 2020-02-02: qty 1

## 2020-02-02 MED ORDER — GUAIFENESIN-DM 100-10 MG/5ML PO SYRP
5.0000 mL | ORAL_SOLUTION | ORAL | Status: DC | PRN
  Administered 2020-02-02 – 2020-02-10 (×3): 5 mL via ORAL
  Filled 2020-02-02 (×3): qty 10

## 2020-02-02 MED ORDER — IPRATROPIUM-ALBUTEROL 0.5-2.5 (3) MG/3ML IN SOLN
3.0000 mL | Freq: Four times a day (QID) | RESPIRATORY_TRACT | Status: DC
  Administered 2020-02-02: 20:00:00 3 mL via RESPIRATORY_TRACT
  Filled 2020-02-02 (×2): qty 3

## 2020-02-02 MED ORDER — POTASSIUM CHLORIDE CRYS ER 20 MEQ PO TBCR: 20.0000 meq | EXTENDED_RELEASE_TABLET | Freq: Every day | ORAL | Status: DC | PRN

## 2020-02-02 MED ORDER — ALBUTEROL SULFATE HFA 108 (90 BASE) MCG/ACT IN AERS
6.0000 | INHALATION_SPRAY | Freq: Once | RESPIRATORY_TRACT | Status: AC
  Administered 2020-02-02: 06:00:00 6 via RESPIRATORY_TRACT
  Filled 2020-02-02: qty 6.7

## 2020-02-02 MED ORDER — FUROSEMIDE 40 MG PO TABS: 40.0000 mg | ORAL_TABLET | Freq: Every day | ORAL | Status: DC | PRN

## 2020-02-02 MED ORDER — ALBUTEROL SULFATE (2.5 MG/3ML) 0.083% IN NEBU: 2.5000 mg | INHALATION_SOLUTION | Freq: Four times a day (QID) | RESPIRATORY_TRACT | Status: DC | PRN

## 2020-02-02 MED ORDER — PANTOPRAZOLE SODIUM 40 MG PO TBEC
40.0000 mg | DELAYED_RELEASE_TABLET | Freq: Two times a day (BID) | ORAL | Status: DC
  Administered 2020-02-02 – 2020-02-12 (×21): 40 mg via ORAL
  Filled 2020-02-02 (×21): qty 1

## 2020-02-02 MED ORDER — NICOTINE 14 MG/24HR TD PT24
14.0000 mg | MEDICATED_PATCH | Freq: Every day | TRANSDERMAL | Status: DC
  Administered 2020-02-02 – 2020-02-12 (×10): 14 mg via TRANSDERMAL
  Filled 2020-02-02 (×10): qty 1

## 2020-02-02 MED ORDER — INSULIN ASPART 100 UNIT/ML ~~LOC~~ SOLN
0.0000 [IU] | Freq: Three times a day (TID) | SUBCUTANEOUS | Status: DC
  Administered 2020-02-02: 3 [IU] via SUBCUTANEOUS
  Administered 2020-02-02 – 2020-02-03 (×4): 2 [IU] via SUBCUTANEOUS
  Administered 2020-02-04: 13:00:00 3 [IU] via SUBCUTANEOUS
  Administered 2020-02-04 – 2020-02-05 (×2): 2 [IU] via SUBCUTANEOUS
  Administered 2020-02-05: 3 [IU] via SUBCUTANEOUS
  Administered 2020-02-05: 2 [IU] via SUBCUTANEOUS
  Administered 2020-02-06: 3 [IU] via SUBCUTANEOUS
  Administered 2020-02-06 – 2020-02-07 (×2): 2 [IU] via SUBCUTANEOUS
  Administered 2020-02-07: 8 [IU] via SUBCUTANEOUS
  Administered 2020-02-08: 5 [IU] via SUBCUTANEOUS
  Administered 2020-02-08 – 2020-02-09 (×2): 3 [IU] via SUBCUTANEOUS
  Administered 2020-02-09: 2 [IU] via SUBCUTANEOUS
  Administered 2020-02-10: 17:00:00 5 [IU] via SUBCUTANEOUS
  Administered 2020-02-12: 3 [IU] via SUBCUTANEOUS
  Filled 2020-02-02: qty 0.15

## 2020-02-02 MED ORDER — FUROSEMIDE 10 MG/ML IJ SOLN
40.0000 mg | Freq: Once | INTRAMUSCULAR | Status: AC
  Administered 2020-02-02: 18:00:00 40 mg via INTRAVENOUS
  Filled 2020-02-02: qty 4

## 2020-02-02 MED ORDER — ACETAMINOPHEN 325 MG PO TABS
650.0000 mg | ORAL_TABLET | Freq: Four times a day (QID) | ORAL | Status: DC | PRN
  Administered 2020-02-02 – 2020-02-09 (×5): 650 mg via ORAL
  Filled 2020-02-02 (×6): qty 2

## 2020-02-02 MED ORDER — ENOXAPARIN SODIUM 30 MG/0.3ML ~~LOC~~ SOLN
30.0000 mg | SUBCUTANEOUS | Status: DC
  Administered 2020-02-02 – 2020-02-07 (×6): 30 mg via SUBCUTANEOUS
  Filled 2020-02-02 (×6): qty 0.3

## 2020-02-02 MED ORDER — AEROCHAMBER Z-STAT PLUS/MEDIUM MISC
1.0000 | Freq: Once | Status: AC
  Administered 2020-02-02: 06:00:00 1
  Filled 2020-02-02: qty 1

## 2020-02-02 MED ORDER — PREDNISONE 20 MG PO TABS
40.0000 mg | ORAL_TABLET | Freq: Every day | ORAL | Status: DC
  Administered 2020-02-03 – 2020-02-04 (×2): 40 mg via ORAL
  Filled 2020-02-02 (×2): qty 2

## 2020-02-02 MED ORDER — PROCHLORPERAZINE EDISYLATE 10 MG/2ML IJ SOLN: 5.0000 mg | INTRAMUSCULAR | Status: DC | PRN

## 2020-02-02 MED ORDER — CYANOCOBALAMIN 1000 MCG/ML IJ SOLN
1000.0000 ug | Freq: Once | INTRAMUSCULAR | Status: AC
  Administered 2020-02-02: 1000 ug via INTRAMUSCULAR
  Filled 2020-02-02: qty 1

## 2020-02-02 MED ORDER — ALLOPURINOL 300 MG PO TABS
300.0000 mg | ORAL_TABLET | Freq: Every day | ORAL | Status: DC
  Administered 2020-02-02 – 2020-02-04 (×3): 300 mg via ORAL
  Filled 2020-02-02 (×3): qty 1

## 2020-02-02 MED ORDER — AMLODIPINE BESYLATE 10 MG PO TABS
10.0000 mg | ORAL_TABLET | Freq: Every day | ORAL | Status: DC
  Administered 2020-02-02 – 2020-02-12 (×10): 10 mg via ORAL
  Filled 2020-02-02 (×2): qty 1
  Filled 2020-02-02 (×8): qty 2

## 2020-02-02 MED ORDER — METOPROLOL SUCCINATE ER 25 MG PO TB24
25.0000 mg | ORAL_TABLET | Freq: Every day | ORAL | Status: DC
  Administered 2020-02-02 – 2020-02-12 (×10): 25 mg via ORAL
  Filled 2020-02-02 (×10): qty 1

## 2020-02-02 MED ORDER — IPRATROPIUM BROMIDE HFA 17 MCG/ACT IN AERS
2.0000 | INHALATION_SPRAY | Freq: Once | RESPIRATORY_TRACT | Status: AC
  Administered 2020-02-02: 2 via RESPIRATORY_TRACT
  Filled 2020-02-02: qty 12.9

## 2020-02-02 MED ORDER — IPRATROPIUM BROMIDE HFA 17 MCG/ACT IN AERS
2.0000 | INHALATION_SPRAY | Freq: Once | RESPIRATORY_TRACT | Status: AC
  Administered 2020-02-02: 07:00:00 2 via RESPIRATORY_TRACT

## 2020-02-02 MED ORDER — FENTANYL CITRATE (PF) 100 MCG/2ML IJ SOLN
25.0000 ug | Freq: Once | INTRAMUSCULAR | Status: AC
  Administered 2020-02-02: 25 ug via INTRAVENOUS
  Filled 2020-02-02: qty 2

## 2020-02-02 MED ORDER — MAGNESIUM SULFATE 2 GM/50ML IV SOLN
2.0000 g | Freq: Once | INTRAVENOUS | Status: AC
  Administered 2020-02-02: 06:00:00 2 g via INTRAVENOUS
  Filled 2020-02-02: qty 50

## 2020-02-02 MED ORDER — KETOROLAC TROMETHAMINE 15 MG/ML IJ SOLN
15.0000 mg | Freq: Once | INTRAMUSCULAR | Status: AC
  Administered 2020-02-02: 15 mg via INTRAVENOUS
  Filled 2020-02-02: qty 1

## 2020-02-02 MED ORDER — DULOXETINE HCL 20 MG PO CPEP
20.0000 mg | ORAL_CAPSULE | Freq: Every day | ORAL | Status: DC
  Administered 2020-02-02 – 2020-02-12 (×10): 20 mg via ORAL
  Filled 2020-02-02 (×11): qty 1

## 2020-02-02 MED ORDER — ALBUTEROL SULFATE HFA 108 (90 BASE) MCG/ACT IN AERS
6.0000 | INHALATION_SPRAY | Freq: Once | RESPIRATORY_TRACT | Status: AC
  Administered 2020-02-02: 6 via RESPIRATORY_TRACT

## 2020-02-02 NOTE — ED Provider Notes (Signed)
[facility] DEPT Provider Note  CSN: [id number] Arrival date & time: 02/02/20 0430  Chief Complaint(s) Shortness of Breath  HPI [name] is a 74 y.o. female with a extensive past medical history listed below including asthma and COPD not requiring oxygen at home who presents to the emergency department for severe shortness of breath.  This is the third visit in 3 days for COPD exacerbation.  This time patient is treated, she improves and is discharged.  States that this is similar to her previous presentations.  States that she still smoking cigarettes.  Denies any fevers or chills.  Endorses cough, that is nonproductive.  No nausea or vomiting.  She is endorsing some mild chest tightness without overt substernal chest pain.  There is nonradiating.  No lower extremity edema or swelling.  No other physical complaints.  Patient reports that she attempted to take albuterol inhalers but cannot get the medicine down into her lungs.  Patient give duoneb and solumedrol by EMS.  HPI  Past Medical History Past Medical History:  Diagnosis Date  . Anxiety and depression   . Arthritis   . Asthma   . Carotid artery occlusion   . Chronic kidney disease   . Chronic pain    leg and feet  . COPD (chronic obstructive pulmonary disease) ([place])   . Coronary artery disease   . DDD (degenerative disc disease)   . Diarrhea    chronic   . DVT (deep venous thrombosis) ([place])   . Dyspnea   . Fibromyalgia   . GERD (gastroesophageal reflux disease)   . Grave's disease   . Headache   . HOH (hard of hearing)   . Hypertension   . OP (osteoporosis)   . Parkinson's disease ([place])   . Peripheral vascular disease ([place])   . PUD (peptic ulcer disease)   . Recurrent upper respiratory infection (URI)   . Rhinitis   . Sleep apnea   . Spinal stenosis of lumbar region 04/23/2013  . Thrombophlebitis   . Tobacco use disorder 04/23/2013  . Vitamin B 12 deficiency    Patient  Active Problem List   Diagnosis Date Noted  . Malnutrition of moderate degree 12/24/2019  . Acute on chronic diastolic CHF (congestive heart failure) ([place]) 12/21/2019  . CKD (chronic kidney disease) stage 3, GFR 30-59 ml/min 12/21/2019  . Hypertensive urgency 11/20/2019  . Flash pulmonary edema ([place])   . Positive D dimer   . Acute respiratory failure with hypercapnia ([place]) 11/04/2019  . Acute exacerbation of CHF (congestive heart failure) ([place]) 11/04/2019  . DNR (do not resuscitate) 11/04/2019  . Chronic pain syndrome 11/04/2019  . Pulmonary nodule, left 08/02/2018  . Major depressive disorder, single episode, moderate ([place]) 08/02/2018  . Benzodiazepine overdose of undetermined intent 08/01/2018  . Cellulitis 05/14/2018  . Anemia 05/14/2018  . At risk for adverse drug event 10/17/2017  . Salicylate intoxication, undetermined intent, subsequent encounter 10/10/2017  . Acute renal failure with acute renal cortical necrosis superimposed on stage 3a chronic kidney disease ([place]) 10/10/2017  . Headache 10/10/2017  . Severe protein-calorie malnutrition ([place]) 01/26/2016  . Opiate overdose ([place]) 01/25/2016  . Suicide attempt ([place]) 01/25/2016  . Depression   . Dyslipidemia   . Gastroesophageal reflux disease without esophagitis   . COPD (chronic obstructive pulmonary disease) ([place]) 07/29/2015  . Diabetes mellitus with neurological manifestation ([place]) 07/29/2015  . Frequent falls 07/29/2015  . HTN (hypertension) 07/29/2015  . AKI (acute kidney injury) ([place]) 07/29/2015  .  SIRS (systemic inflammatory response syndrome) ([place]) 07/29/2015  . Spinal stenosis of lumbar region 04/23/2013  . Tremor due to multiple drugs 04/23/2013  . Tobacco use disorder 04/23/2013  . COPD exacerbation ([place]) 04/23/2013  . Occlusion and stenosis of carotid artery without mention of cerebral infarction 03/12/2012  . Viral bronchitis-possible h. infl vs Norovirus 11/21/2011  . Pleuritic chest pain 09/18/2011   Home  Medication(s) Prior to Admission medications   Medication Sig Start Date End Date Taking? Authorizing Provider  albuterol (PROVENTIL) (2.5 MG/3ML) 0.083% nebulizer solution Inhale 3 mLs into the lungs 2 (two) times daily. 11/09/19  Yes Regalado, Belkys A, MD  albuterol (VENTOLIN HFA) 108 (90 Base) MCG/ACT inhaler Inhale 2 puffs into the lungs every 6 (six) hours as needed for wheezing or shortness of breath. 11/09/19  Yes Regalado, Belkys A, MD  allopurinol (ZYLOPRIM) 300 MG tablet Take 1 tablet (300 mg total) by mouth daily. 11/09/19  Yes Regalado, Belkys A, MD  amLODipine (NORVASC) 10 MG tablet Take 1 tablet (10 mg total) by mouth daily. 11/09/19  Yes Regalado, Belkys A, MD  atorvastatin (LIPITOR) 20 MG tablet Take 1 tablet (20 mg total) by mouth at bedtime. 11/09/19  Yes Regalado, Belkys A, MD  DULoxetine (CYMBALTA) 20 MG capsule Take 20 mg by mouth daily. 12/21/19  Yes [provider]  metoprolol succinate (TOPROL-XL) 25 MG 24 hr tablet Take 25 mg by mouth daily. 12/21/19  Yes [provider]  Multiple Vitamin (MULTIVITAMIN) capsule Take 1 capsule by mouth daily.   Yes [provider]  pantoprazole (PROTONIX) 40 MG tablet Take 40 mg by mouth 2 (two) times daily. 10/28/19  Yes [provider]  potassium chloride SA (KLOR-CON) 20 MEQ tablet Take 20 mEq by mouth daily. 12/21/19  Yes [provider]  umeclidinium bromide (INCRUSE ELLIPTA) 62.5 MCG/INH AEPB Inhale 1 puff into the lungs daily. 01/31/20  Yes Caccavale, Sophia, PA-C  furosemide (LASIX) 40 MG tablet Take 1 tablet (40 mg total) by mouth daily. Patient taking differently: Take 40 mg by mouth daily as needed for fluid.  11/24/19 02/01/20  Arrien, Jimmy Picket, MD  metoprolol succinate (TOPROL-XL) 50 MG 24 hr tablet Take 1 tablet (50 mg total) by mouth daily. Take with or immediately following a meal. Patient not taking: Reported on 02/01/2020 11/10/19   Regalado, Jerald Kief A, MD  predniSONE (DELTASONE) 50 MG  tablet Take 1 tablet (50 mg total) by mouth daily. 12/03/40   Delora Fuel, MD                                                                                                                                    Past Surgical History Past Surgical History:  Procedure Laterality Date  . CAROTID ENDARTERECTOMY Left Sept. 20,2011   cea  . CHOLECYSTECTOMY  1999  . GASTRECTOMY     age 50   Part of small intestin and part of stomach  .  LEFT HEART CATHETERIZATION WITH CORONARY ANGIOGRAM N/A 10/02/2011   Procedure: LEFT HEART CATHETERIZATION WITH CORONARY ANGIOGRAM;  Surgeon: Sueanne Margarita, MD;  Location: [facility];  Service: Cardiovascular;  Laterality: N/A;   Family History Family History  Problem Relation Age of Onset  . Diabetes Mother   . Hyperlipidemia Mother   . Heart attack Mother   . Other Mother        varicose veins,respiratory,stroke  . Heart disease Mother        before age 31  . Hypertension Mother   . Varicose Veins Mother   . Diabetes Father   . Heart disease Father        before age 79  . Hyperlipidemia Father   . Heart attack Father   . Other Father        varicose veins  . Hypertension Father   . Varicose Veins Father   . Diabetes Sister   . Heart disease Sister        before age 17  . Hyperlipidemia Sister   . Heart attack Sister   . Other Sister        varicose veins  . Hypertension Sister   . Varicose Veins Sister   . Peripheral vascular disease Sister   . Diabetes Sister   . Hyperlipidemia Sister   . Hypertension Sister   . Varicose Veins Sister     Social History Social History   Tobacco Use  . Smoking status: Current Every Day Smoker    Packs/day: 2.00    Years: 50.00    Pack years: 100.00    Types: Cigarettes  . Smokeless tobacco: Never Used  . Tobacco comment: cessation info given and reviewed  Substance Use Topics  . Alcohol use: No    Alcohol/week: 0.0 standard drinks  . Drug use: No   Allergies Patient has no known  allergies.  Review of Systems Review of Systems All other systems are reviewed and are negative for acute change except as noted in the HPI  Physical Exam Vital Signs  I have reviewed the triage vital signs BP (!) 168/84 (BP Location: Right Arm)   Pulse 96   Resp (!) 24   SpO2 100%   Physical Exam Vitals reviewed.  Constitutional:      General: She is not in acute distress.    Appearance: She is well-developed. She is not diaphoretic.  HENT:     Head: Normocephalic and atraumatic.     Nose: Nose normal.  Eyes:     General: No scleral icterus.       Right eye: No discharge.        Left eye: No discharge.     Conjunctiva/sclera: Conjunctivae normal.     Pupils: Pupils are equal, round, and reactive to light.     Comments: exopthalmic   Cardiovascular:     Rate and Rhythm: Normal rate and regular rhythm.     Heart sounds: No murmur. No friction rub. No gallop.   Pulmonary:     Effort: Tachypnea, accessory muscle usage, prolonged expiration and respiratory distress present.     Breath sounds: No stridor. Wheezing (insp and exp) and rhonchi present. No rales.  Abdominal:     General: There is no distension.     Palpations: Abdomen is soft.     Tenderness: There is no abdominal tenderness.  Musculoskeletal:        General: No tenderness.     Cervical back: Normal range of motion  and neck supple.  Skin:    General: Skin is warm and dry.     Findings: No erythema or rash.  Neurological:     Mental Status: She is alert and oriented to person, place, and time.     ED Results and Treatments Labs (all labs ordered are listed, but only abnormal results are displayed) Labs Reviewed  BLOOD GAS, ARTERIAL - Abnormal; Notable for the following components:      Result Value   pH, Arterial 7.274 (*)    pO2, Arterial 39.6 (*)    Bicarbonate 16.9 (*)    Acid-base deficit 8.8 (*)    All other components within normal limits  CBC WITH DIFFERENTIAL/PLATELET - Abnormal; Notable  for the following components:   RBC 3.10 (*)    Hemoglobin 8.3 (*)    HCT 28.3 (*)    MCHC 29.3 (*)    RDW 17.8 (*)    Neutro Abs 8.3 (*)    All other components within normal limits  BASIC METABOLIC PANEL - Abnormal; Notable for the following components:   Chloride 114 (*)    CO2 16 (*)    Glucose, Bld 124 (*)    BUN 39 (*)    Creatinine, Ser 1.54 (*)    Calcium 8.4 (*)    GFR calc non Af Amer 33 (*)    GFR calc Af Amer 38 (*)    All other components within normal limits                                                                                                                         EKG  EKG Interpretation  Date/Time:    Ventricular Rate:    PR Interval:    QRS Duration:   QT Interval:    QTC Calculation:   R Axis:     Text Interpretation:        Radiology CT Chest Wo Contrast  Result Date: 02/02/2020 CLINICAL DATA:  shortness of breath and abnormal chest x-ray EXAM: CT CHEST WITHOUT CONTRAST TECHNIQUE: Multidetector CT imaging of the chest was performed following the standard protocol without IV contrast. COMPARISON:  10/17/2017 FINDINGS: Cardiovascular: Borderline heart size. No pericardial effusion. There is aortic and coronary atherosclerosis. No acute vascular finding without contrast Mediastinum/Nodes: Increase in size of mediastinal lymph nodes since 2018 but strictly still within normal limits at 15 mm or less in short axis, largest located at the precarinal station. Surgical clips around the GE junction, likely hernia repair. Progressive venous collaterals in the chest wall and upper mediastinum. The left brachiocephalic vein is collapsed. Lungs/Pleura: Small bilateral pleural effusion which is dependent and layering. There is streaky density with volume loss at the lung bases. Scar-like appearance with equivocal cylindrical bronchiectasis at the medial segment right middle lobe. Mild generalized interlobular septal thickening at the lung bases. Subpleural nodule in  the left upper lobe measuring 16 mm, new from 2018 when it was only a tiny subpleural nodule. Upper  Abdomen: No acute finding. Musculoskeletal: No acute finding IMPRESSION: 1. 16 mm nodule in the subpleural left upper lobe, worrisome for bronchogenic carcinoma. Recommend referral to multi disciplinary thoracic oncology. 2. Small pleural effusions and mild interstitial edema. Atelectasis is seen at both lung bases. 3. Progressive collaterals in the chest wall associated with left brachiocephalic vein occlusion/collapse. This may account for mild interval enlargement of mediastinal lymph nodes. 4.  Aortic Atherosclerosis (ICD10-I70.0).  Coronary atherosclerosis. Electronically Signed   By: Monte Fantasia M.D.   On: 02/02/2020 07:36   DG Chest Port 1 View  Result Date: 02/02/2020 CLINICAL DATA:  Shortness of breath EXAM: PORTABLE CHEST 1 VIEW COMPARISON:  Yesterday FINDINGS: Cardiomegaly with hazy and interstitial opacity. Small left pleural effusion. No pneumothorax. Postoperative left neck. IMPRESSION: Worsening interstitial opacity favoring edema. Small left pleural effusion. Electronically Signed   By: Monte Fantasia M.D.   On: 02/02/2020 05:52    Pertinent labs & imaging results that were available during my care of the patient were reviewed by me and considered in my medical decision making (see chart for details).  Medications Ordered in ED Medications  albuterol (VENTOLIN HFA) 108 (90 Base) MCG/ACT inhaler 6 puff (6 puffs Inhalation Given 02/02/20 0547)  ipratropium (ATROVENT HFA) inhaler 2 puff (2 puffs Inhalation Given 02/02/20 0547)  aerochamber Z-Stat Plus/medium 1 each (1 each Other Given 02/02/20 0547)  magnesium sulfate IVPB 2 g 50 mL (0 g Intravenous Stopped 02/02/20 0657)  albuterol (VENTOLIN HFA) 108 (90 Base) MCG/ACT inhaler 6 puff (6 puffs Inhalation Given 02/02/20 0724)  ipratropium (ATROVENT HFA) inhaler 2 puff (2 puffs Inhalation Given 02/02/20 0723)                                                                                                                                     Procedures .Critical Care Performed by: Fatima Blank, MD Authorized by: Fatima Blank, MD    CRITICAL CARE Performed by: Grayce Sessions Mariel Gaudin Total critical care time: 45 minutes Critical care time was exclusive of separately billable procedures and treating other patients. Critical care was necessary to treat or prevent imminent or life-threatening deterioration. Critical care was time spent personally by me on the following activities: development of treatment plan with patient and/or surrogate as well as nursing, discussions with consultants, evaluation of patient's response to treatment, examination of patient, obtaining history from patient or surrogate, ordering and performing treatments and interventions, ordering and review of laboratory studies, ordering and review of radiographic studies, pulse oximetry and re-evaluation of patient's condition.    (including critical care time)  Medical Decision Making / ED Course I have reviewed the nursing notes for this encounter and the patient's prior records (if available in EHR or on provided paperwork).   [name] was evaluated in Emergency Department on 02/02/2020 for the symptoms described in the history of present illness. She was evaluated in the context of  the global COVID-19 pandemic, which necessitated consideration that the patient might be at risk for infection with the SARS-CoV-2 virus that causes COVID-19. Institutional protocols and algorithms that pertain to the evaluation of patients at risk for COVID-19 are in a state of rapid change based on information released by regulatory bodies including the CDC and federal and state organizations. These policies and algorithms were followed during the patient's care in the ED.  Patient presents with shortness of breath consistent with COPD exacerbation.  Given DuoNeb and  Solu-Medrol in route which provided some mild relief.  Lungs have diffuse wheezing throughout.  Patient appears to be in respiratory distress.  Saturations dropped to 87% on room air.  Stable on 2 L nasal cannula.  Labs at patient's baseline.  Patient provided with high-dose albuterol and Atrovent puffs with a spacer.  This provided significant relief.  Chest x-ray notable for worsening lower lobe opacities which were felt to be related to possible edema.  Patient does not have a history of heart failure.  CT scan obtained to better characterize opacities and revealed atelectasis due to pleural effusions.  Patient provided with additional puffs.  Will need to be monitored for additional hour and reassessed.  Patient care turned over to Dr Zenia Resides. Patient case and results discussed in detail; please see their note for further ED managment.          Final Clinical Impression(s) / ED Diagnoses Final diagnoses:  SOB (shortness of breath)  COPD exacerbation ([place])      This chart was dictated using voice recognition software.  Despite best efforts to proofread,  errors can occur which can change the documentation meaning.   Fatima Blank, MD 02/02/20 [phone]

## 2020-02-02 NOTE — Plan of Care (Signed)
Call bell and belongings placed within reach. Bed alarm set. Patient will call for assistance when needing to get out of the bed.

## 2020-02-02 NOTE — ED Provider Notes (Signed)
Patient signed with Dr. Jeris Penta pending reevaluation after being treated for her dyspnea.  Patient continues to require oxygen.  Likely COPD exacerbation will consult hospitalist for admission   Lacretia Leigh, MD 02/02/20 [phone]

## 2020-02-02 NOTE — ED Notes (Signed)
o2 removed by P Cardama @ 0730. Patient o2 saturations in 90's at that time. Currently, patient's o2 sat 89, 2L [place] placed on patient.

## 2020-02-02 NOTE — ED Notes (Signed)
Patients oxygen saturation reading 88%, placed on 2L of O2

## 2020-02-02 NOTE — H&P (Addendum)
History and Physical    [name] GEX:[id number] DOB: 02-01-1946 DOA: 02/02/2020  PCP: [name], MD  Patient coming from: Home.  I have personally briefly reviewed patient's old medical records in [place]  Chief Complaint: Shortness of breath.  HPI: [name] is a 74 y.o. female with medical history significant of anxiety, depression, asthma/COPD, active smoker, carotid artery occlusion, chronic kidney disease, chronic pain of lower extremities and feet, fibromyalgia, spinal stenosis of lumbar region, degenerative disc disease, history of DVT, CAD, chronic diarrhea, GERD, Graves' disease, chronic headaches, hypertensions, Parkinson's disease, peripheral vascular disease, PUD, recurrent URIs, sleep apnea not on CPAP, thrombophlebitis, type 2 diabetes with neuropathy, vitamin B12 deficiency who is coming to the emergency department for the third straight day due to progressively worse dyspnea for the past few days.  She has been using her bronchodilators without significant results at home.  Patient states that she woke up today around 3:40 AM with significant dyspnea.  EMS was called.  They gave her a DuoNeb and 125 mg of IV Solu-Medrol.  She was placed on a nonrebreather oxygen mask transiently.  She denies fever, chills, sore throat or rhinorrhea.  She has been having a mostly dry cough.  She denies hemoptysis, nausea or emesis, chest pain, palpitations, dizziness, diaphoresis, PND, orthopnea or pitting edema of the lower extremities.  Denies abdominal pain, diarrhea, constipation, melena or hematochezia.  No dysuria, frequency or hematuria.  ED Course: Initial vital signs temperature 97.9 F, pulse 96, respiration 24, blood pressure 168/84 mmHg and O2 sat 100% 12 LPM and on NRB.  She was most recently 99% on 2 LPM via nasal cannula.  In the ED, she received magnesium sulfate and bronchodilators.  Lab work shows a white count of 9.5, hemoglobin 8.3 g/dL and platelets  311.  BMP Sodium of 141, potassium 4.1, chloride 114 and CO2 16 mmol/L.  Glucose 124, BUN 39, creatinine 1.54 and calcium 8.4 mg/dL.  ABG shows a pH of 7.27, PCO2 of 37.6 and PO2 of 39.6 mmHg.  Bicarbonate was 16.9 and acid base deficit was 8.8 mmol/L.  BNP was 1305.3 pg/mL.  Magnesium is 2.5 and phosphorus 4.5 mg/dL.  Chest radiograph shows worsening interstitial opacities favoring edema.  There was small left pleural effusion.  Review of Systems: As per HPI otherwise 10 point review of systems negative.   Past Medical History:  Diagnosis Date  . Anxiety and depression   . Arthritis   . Asthma   . Carotid artery occlusion   . Chronic kidney disease   . Chronic pain    leg and feet  . COPD (chronic obstructive pulmonary disease) ([place])   . Coronary artery disease   . DDD (degenerative disc disease)   . Diarrhea    chronic   . DVT (deep venous thrombosis) ([place])   . Dyspnea   . Fibromyalgia   . GERD (gastroesophageal reflux disease)   . Grave's disease   . Headache   . HOH (hard of hearing)   . Hypertension   . OP (osteoporosis)   . Parkinson's disease ([place])   . Peripheral vascular disease ([place])   . PUD (peptic ulcer disease)   . Recurrent upper respiratory infection (URI)   . Rhinitis   . Sleep apnea   . Spinal stenosis of lumbar region 04/23/2013  . Thrombophlebitis   . Tobacco use disorder 04/23/2013  . Vitamin B 12 deficiency     Past Surgical History:  Procedure Laterality Date  .  CAROTID ENDARTERECTOMY Left Sept. 20,2011   cea  . CHOLECYSTECTOMY  1999  . GASTRECTOMY     age 39   Part of small intestin and part of stomach  . LEFT HEART CATHETERIZATION WITH CORONARY ANGIOGRAM N/A 10/02/2011   Procedure: LEFT HEART CATHETERIZATION WITH CORONARY ANGIOGRAM;  Surgeon: Sueanne Margarita, MD;  Location: [facility];  Service: Cardiovascular;  Laterality: N/A;     reports that she has been smoking cigarettes. She has a 100.00 pack-year smoking history. She has never used  smokeless tobacco. She reports that she does not drink alcohol or use drugs.  No Known Allergies  Family History  Problem Relation Age of Onset  . Diabetes Mother   . Hyperlipidemia Mother   . Heart attack Mother   . Other Mother        varicose veins,respiratory,stroke  . Heart disease Mother        before age 71  . Hypertension Mother   . Varicose Veins Mother   . Diabetes Father   . Heart disease Father        before age 4  . Hyperlipidemia Father   . Heart attack Father   . Other Father        varicose veins  . Hypertension Father   . Varicose Veins Father   . Diabetes Sister   . Heart disease Sister        before age 13  . Hyperlipidemia Sister   . Heart attack Sister   . Other Sister        varicose veins  . Hypertension Sister   . Varicose Veins Sister   . Peripheral vascular disease Sister   . Diabetes Sister   . Hyperlipidemia Sister   . Hypertension Sister   . Varicose Veins Sister    Prior to Admission medications   Medication Sig Start Date End Date Taking? Authorizing Provider  albuterol (PROVENTIL) (2.5 MG/3ML) 0.083% nebulizer solution Inhale 3 mLs into the lungs 2 (two) times daily. 11/09/19  Yes Regalado, Belkys A, MD  albuterol (VENTOLIN HFA) 108 (90 Base) MCG/ACT inhaler Inhale 2 puffs into the lungs every 6 (six) hours as needed for wheezing or shortness of breath. 11/09/19  Yes Regalado, Belkys A, MD  allopurinol (ZYLOPRIM) 300 MG tablet Take 1 tablet (300 mg total) by mouth daily. 11/09/19  Yes Regalado, Belkys A, MD  amLODipine (NORVASC) 10 MG tablet Take 1 tablet (10 mg total) by mouth daily. 11/09/19  Yes Regalado, Belkys A, MD  atorvastatin (LIPITOR) 20 MG tablet Take 1 tablet (20 mg total) by mouth at bedtime. 11/09/19  Yes Regalado, Belkys A, MD  DULoxetine (CYMBALTA) 20 MG capsule Take 20 mg by mouth daily. 12/21/19  Yes [provider]  metoprolol succinate (TOPROL-XL) 25 MG 24 hr tablet Take 25 mg by mouth daily. 12/21/19  Yes [provider]  Multiple Vitamin (MULTIVITAMIN) capsule Take 1 capsule by mouth daily.   Yes [provider]  pantoprazole (PROTONIX) 40 MG tablet Take 40 mg by mouth 2 (two) times daily. 10/28/19  Yes [provider]  potassium chloride SA (KLOR-CON) 20 MEQ tablet Take 20 mEq by mouth daily. 12/21/19  Yes [provider]  umeclidinium bromide (INCRUSE ELLIPTA) 62.5 MCG/INH AEPB Inhale 1 puff into the lungs daily. 01/31/20  Yes Caccavale, Sophia, PA-C  furosemide (LASIX) 40 MG tablet Take 1 tablet (40 mg total) by mouth daily. Patient taking differently: Take 40 mg by mouth daily as needed for fluid.  11/24/19 02/01/20  Arrien, Jimmy Picket, MD  metoprolol succinate (TOPROL-XL) 50 MG 24 hr tablet Take 1 tablet (50 mg total) by mouth daily. Take with or immediately following a meal. Patient not taking: Reported on 02/01/2020 11/10/19   Regalado, Jerald Kief A, MD  predniSONE (DELTASONE) 50 MG tablet Take 1 tablet (50 mg total) by mouth daily. 02/29/96   Delora Fuel, MD    Physical Exam: Vitals:   02/02/20 0900 02/02/20 0915 02/02/20 0930 02/02/20 1106  BP: (!) 173/76 (!) 172/76 (!) 172/76 (!) 146/78  Pulse: (!) 106 (!) 106 (!) 106 (!) 103  Resp:      Temp:    98.4 F (36.9 C)  TempSrc:    Oral  SpO2: (!) 89% 91% 96% 100%    Constitutional: Frail, elderly female. Eyes: PERRL, lids and conjunctivae normal ENMT: Mucous membranes are mildly dry.. Posterior pharynx clear of any exudate or lesions.Normal dentition.  Neck: normal, supple, no masses, no thyromegaly Respiratory: Mildly tachypneic in the mid 20s.  No accessory muscle use.  Bibasilar crackles, bilateral rhonchi and wheezing.. Cardiovascular: Tachycardic in the low 100s with a regular rhythm, no murmurs / rubs / gallops.  Trace lower extremities extremity edema. 2+ pedal pulses. No carotid bruits.  Abdomen: no tenderness, no masses palpated. No hepatosplenomegaly. Bowel sounds positive.  Musculoskeletal: no clubbing  / cyanosis. Good ROM, no contractures. Normal muscle tone.  Skin: no rashes, lesions, ulcers on very limited dermatological examination. Neurologic: Basal tremor.  Otherwise CN 2-12 grossly intact. Sensation intact, DTR normal. Strength 5/5 in all 4.  Psychiatric: Normal judgment and insight. Alert and oriented x 3. Normal mood.   Labs on Admission: I have personally reviewed following labs and imaging studies  CBC: Recent Labs  Lab 01/31/20 0550 02/01/20 0725 02/02/20 0546  WBC 11.9* 12.2* 9.5  NEUTROABS 10.9* 8.2* 8.3*  HGB 8.5* 8.9* 8.3*  HCT 28.2* 31.5* 28.3*  MCV 91.3 94.3 91.3  PLT 293 408* 673   Basic Metabolic Panel: Recent Labs  Lab 01/31/20 0550 02/01/20 0725 02/02/20 0546  NA 142 143 141  K 4.1 4.3 4.1  CL 116* 114* 114*  CO2 17* 15* 16*  GLUCOSE 120* 110* 124*  BUN 34* 34* 39*  CREATININE 1.20* 1.46* 1.54*  CALCIUM 8.5* 8.5* 8.4*  MG  --   --  2.5*  PHOS  --   --  4.5   GFR: Estimated Creatinine Clearance: 24.5 mL/min (A) (by C-G formula based on SCr of 1.54 mg/dL (H)). Liver Function Tests: Recent Labs  Lab 01/31/20 0550  AST 14*  ALT 10  ALKPHOS 136*  BILITOT 0.6  PROT 6.5  ALBUMIN 3.4*   No results for input(s): LIPASE, AMYLASE in the last 168 hours. No results for input(s): AMMONIA in the last 168 hours. Coagulation Profile: No results for input(s): INR, PROTIME in the last 168 hours. Cardiac Enzymes: No results for input(s): CKTOTAL, CKMB, CKMBINDEX, TROPONINI in the last 168 hours. BNP (last 3 results) No results for input(s): PROBNP in the last 8760 hours. HbA1C: No results for input(s): HGBA1C in the last 72 hours. CBG: Recent Labs  Lab 01/31/20 0613  GLUCAP 125*   Lipid Profile: No results for input(s): CHOL, HDL, LDLCALC, TRIG, CHOLHDL, LDLDIRECT in the last 72 hours. Thyroid Function Tests: No results for input(s): TSH, T4TOTAL, FREET4, T3FREE, THYROIDAB in the last 72 hours. Anemia Panel: No results for input(s):  VITAMINB12, FOLATE, FERRITIN, TIBC, IRON, RETICCTPCT in the last 72 hours. Urine analysis:  Component Value Date/Time   COLORURINE STRAW (A) 11/04/2019 0911   APPEARANCEUR CLEAR 11/04/2019 0911   LABSPEC 1.010 11/04/2019 0911   PHURINE 5.0 11/04/2019 0911   GLUCOSEU NEGATIVE 11/04/2019 0911   HGBUR NEGATIVE 11/04/2019 0911   BILIRUBINUR NEGATIVE 11/04/2019 0911   KETONESUR NEGATIVE 11/04/2019 0911   PROTEINUR NEGATIVE 11/04/2019 0911   UROBILINOGEN 0.2 07/28/2015 2223   NITRITE NEGATIVE 11/04/2019 0911   LEUKOCYTESUR MODERATE (A) 11/04/2019 0911    Radiological Exams on Admission: DG Chest 2 View  Result Date: 02/01/2020 CLINICAL DATA:  Shortness of breath. EXAM: CHEST - 2 VIEW COMPARISON:  01/31/2020 FINDINGS: The cardiac silhouette, mediastinal and hilar contours are stable. Stable underlying chronic lung disease with emphysema and pulmonary scarring. Persistent left pleural effusion and overlying atelectasis and scarring. Stable vague rounded density in the left upper lobe. No definite acute pulmonary findings. IMPRESSION: Chronic lung disease with persistent left pleural effusion and overlying atelectasis and scarring. No definite acute overlying pulmonary process. Electronically Signed   By: Marijo Sanes M.D.   On: 02/01/2020 05:37   CT Chest Wo Contrast  Result Date: 02/02/2020 CLINICAL DATA:  shortness of breath and abnormal chest x-ray EXAM: CT CHEST WITHOUT CONTRAST TECHNIQUE: Multidetector CT imaging of the chest was performed following the standard protocol without IV contrast. COMPARISON:  10/17/2017 FINDINGS: Cardiovascular: Borderline heart size. No pericardial effusion. There is aortic and coronary atherosclerosis. No acute vascular finding without contrast Mediastinum/Nodes: Increase in size of mediastinal lymph nodes since 2018 but strictly still within normal limits at 15 mm or less in short axis, largest located at the precarinal station. Surgical clips around the GE  junction, likely hernia repair. Progressive venous collaterals in the chest wall and upper mediastinum. The left brachiocephalic vein is collapsed. Lungs/Pleura: Small bilateral pleural effusion which is dependent and layering. There is streaky density with volume loss at the lung bases. Scar-like appearance with equivocal cylindrical bronchiectasis at the medial segment right middle lobe. Mild generalized interlobular septal thickening at the lung bases. Subpleural nodule in the left upper lobe measuring 16 mm, new from 2018 when it was only a tiny subpleural nodule. Upper Abdomen: No acute finding. Musculoskeletal: No acute finding IMPRESSION: 1. 16 mm nodule in the subpleural left upper lobe, worrisome for bronchogenic carcinoma. Recommend referral to multi disciplinary thoracic oncology. 2. Small pleural effusions and mild interstitial edema. Atelectasis is seen at both lung bases. 3. Progressive collaterals in the chest wall associated with left brachiocephalic vein occlusion/collapse. This may account for mild interval enlargement of mediastinal lymph nodes. 4.  Aortic Atherosclerosis (ICD10-I70.0).  Coronary atherosclerosis. Electronically Signed   By: Monte Fantasia M.D.   On: 02/02/2020 07:36   DG Chest Port 1 View  Result Date: 02/02/2020 CLINICAL DATA:  Shortness of breath EXAM: PORTABLE CHEST 1 VIEW COMPARISON:  Yesterday FINDINGS: Cardiomegaly with hazy and interstitial opacity. Small left pleural effusion. No pneumothorax. Postoperative left neck. IMPRESSION: Worsening interstitial opacity favoring edema. Small left pleural effusion. Electronically Signed   By: Monte Fantasia M.D.   On: 02/02/2020 05:52   Echo 10/2019  1. Left ventricular ejection fraction, by visual estimation, is 60 to  65%. The left ventricle has normal function. There is moderately increased  left ventricular hypertrophy.  2. Left ventricular diastolic parameters are indeterminate.  3. Global right ventricle has  normal systolic function.The right  ventricular size is normal.  4. Left atrial size was normal.  5. Right atrial size was normal.  6. The mitral valve is  normal in structure. Trivial mitral valve  regurgitation.  7. The tricuspid valve is normal in structure.  8. The aortic valve is tricuspid. Aortic valve regurgitation is not  visualized. No evidence of aortic valve sclerosis or stenosis.  9. The pulmonic valve was not well visualized. Pulmonic valve  regurgitation is not visualized.  10. The inferior vena cava is dilated in size with >50% respiratory  variability, suggesting right atrial pressure of 8 mmHg.  11. The tricuspid regurgitant velocity is 2.53 m/s, and with an assumed  right atrial pressure of 8 mmHg, the estimated right ventricular systolic  pressure is mildly elevated at 33.6 mmHg.   EKG: Independently reviewed. Vent. rate 94 BPM PR interval * ms QRS duration 92 ms QT/QTc 383/479 ms P-R-T axes 56 -2 101 Sinus rhythm Probable left atrial enlargement Probable LVH with secondary repol abnrm  Assessment/Plan Principal Problem:   Acute respiratory failure with hypoxemia (HCC) Observation/telemetry. Continue supplemental oxygen. Continue bronchodilators. Continue Solu-Medrol. Switch to prednisone in a.m. if possible. Continue CHF treatment.  Active Problems:   Acute on chronic diastolic CHF (congestive heart failure) (HCC) Continue supplemental oxygen. Furosemide 40 mg IVP x1. Monitor intake and output. Monitor daily weights. Monitor renal function electrolytes. Treat asthma/COPD    Type II diabetes mellitus with neurological manifestation ([place]) Advised that her glucose will rise with glucocorticoids. Carbohydrate modified diet. CBG monitoring with RI SS while on steroids. Check hemoglobin A1c.    HTN (hypertension) Continue amlodipine 10 mg p.o. daily. Continue metoprolol 25 mg p.o. daily. On furosemide as needed. Monitor BP, HR, renal function  electrolytes.    Depression Continue Cymbalta 20 mg p.o. daily.    Dyslipidemia Continue atorvastatin 20 mg p.o. daily.    Gastroesophageal reflux disease without esophagitis Protonix 40 mg p.o. daily.    Pulmonary nodule, left Pulmonology evaluation as in or outpatient    DNR (do not resuscitate) The patient was very categorical about her wishes.    Vitamin B 12 deficiency Has not had a injection in several months. Cyanocobalamin 1000 mcg IM x1 now.    Tremor Tremors more apparent with bronchodilators. Questionable history of Parkinson's disease Advised to follow-up with neurology as an outpatient.   DVT prophylaxis: DNR. Code Status: Lovenox SQ. Family Communication: Disposition Plan: Observation for asthma/COPD exacerbation. Consults called: Admission status: Observation/telemetry.   Reubin Milan MD [facility]  If 7PM-7AM, please contact night-coverage [email or web address]  02/02/2020, 11:16 AM   This document was prepared using Dragon voice recognition software and may contain some unintended transcription errors.

## 2020-02-02 NOTE — ED Triage Notes (Signed)
Patient arrived via [facility] with complaints of shortness of breath that started today at 340am. Per ems family stating this has been an ongoing problem. Patient given one Duoneb and 125mg  Solumedrol. Patient arrived on 12L on the nonrebreather.

## 2020-02-02 NOTE — ED Notes (Signed)
Date and time results received: 02/02/20 5:53 AM  (use smartphrase ".now" to insert current time)  Test: PO2 Critical Value: 39.6  Name of Provider Notified: Dr.Cardama  Orders Received? Or Actions Taken?:

## 2020-02-03 DIAGNOSIS — G4733 Obstructive sleep apnea (adult) (pediatric): Secondary | ICD-10-CM | POA: Diagnosis present

## 2020-02-03 DIAGNOSIS — R911 Solitary pulmonary nodule: Secondary | ICD-10-CM | POA: Diagnosis not present

## 2020-02-03 DIAGNOSIS — Z8711 Personal history of peptic ulcer disease: Secondary | ICD-10-CM | POA: Diagnosis not present

## 2020-02-03 DIAGNOSIS — F320 Major depressive disorder, single episode, mild: Secondary | ICD-10-CM

## 2020-02-03 DIAGNOSIS — E1151 Type 2 diabetes mellitus with diabetic peripheral angiopathy without gangrene: Secondary | ICD-10-CM | POA: Diagnosis present

## 2020-02-03 DIAGNOSIS — I5033 Acute on chronic diastolic (congestive) heart failure: Secondary | ICD-10-CM | POA: Diagnosis present

## 2020-02-03 DIAGNOSIS — Z8249 Family history of ischemic heart disease and other diseases of the circulatory system: Secondary | ICD-10-CM | POA: Diagnosis not present

## 2020-02-03 DIAGNOSIS — I130 Hypertensive heart and chronic kidney disease with heart failure and stage 1 through stage 4 chronic kidney disease, or unspecified chronic kidney disease: Secondary | ICD-10-CM | POA: Diagnosis present

## 2020-02-03 DIAGNOSIS — E440 Moderate protein-calorie malnutrition: Secondary | ICD-10-CM | POA: Diagnosis present

## 2020-02-03 DIAGNOSIS — G8929 Other chronic pain: Secondary | ICD-10-CM | POA: Diagnosis present

## 2020-02-03 DIAGNOSIS — J441 Chronic obstructive pulmonary disease with (acute) exacerbation: Secondary | ICD-10-CM | POA: Diagnosis not present

## 2020-02-03 DIAGNOSIS — I10 Essential (primary) hypertension: Secondary | ICD-10-CM | POA: Diagnosis not present

## 2020-02-03 DIAGNOSIS — Z66 Do not resuscitate: Secondary | ICD-10-CM | POA: Diagnosis present

## 2020-02-03 DIAGNOSIS — N179 Acute kidney failure, unspecified: Secondary | ICD-10-CM | POA: Diagnosis not present

## 2020-02-03 DIAGNOSIS — I361 Nonrheumatic tricuspid (valve) insufficiency: Secondary | ICD-10-CM | POA: Diagnosis not present

## 2020-02-03 DIAGNOSIS — K219 Gastro-esophageal reflux disease without esophagitis: Secondary | ICD-10-CM | POA: Diagnosis present

## 2020-02-03 DIAGNOSIS — G20 Parkinson's disease: Secondary | ICD-10-CM | POA: Diagnosis present

## 2020-02-03 DIAGNOSIS — R0602 Shortness of breath: Secondary | ICD-10-CM | POA: Diagnosis present

## 2020-02-03 DIAGNOSIS — I340 Nonrheumatic mitral (valve) insufficiency: Secondary | ICD-10-CM | POA: Diagnosis not present

## 2020-02-03 DIAGNOSIS — Z7951 Long term (current) use of inhaled steroids: Secondary | ICD-10-CM | POA: Diagnosis not present

## 2020-02-03 DIAGNOSIS — I2510 Atherosclerotic heart disease of native coronary artery without angina pectoris: Secondary | ICD-10-CM | POA: Diagnosis present

## 2020-02-03 DIAGNOSIS — Z20822 Contact with and (suspected) exposure to covid-19: Secondary | ICD-10-CM | POA: Diagnosis present

## 2020-02-03 DIAGNOSIS — Z79899 Other long term (current) drug therapy: Secondary | ICD-10-CM | POA: Diagnosis not present

## 2020-02-03 DIAGNOSIS — H9190 Unspecified hearing loss, unspecified ear: Secondary | ICD-10-CM | POA: Diagnosis present

## 2020-02-03 DIAGNOSIS — M797 Fibromyalgia: Secondary | ICD-10-CM | POA: Diagnosis present

## 2020-02-03 DIAGNOSIS — J9601 Acute respiratory failure with hypoxia: Secondary | ICD-10-CM | POA: Diagnosis present

## 2020-02-03 DIAGNOSIS — J471 Bronchiectasis with (acute) exacerbation: Secondary | ICD-10-CM | POA: Diagnosis present

## 2020-02-03 DIAGNOSIS — Z681 Body mass index (BMI) 19 or less, adult: Secondary | ICD-10-CM | POA: Diagnosis not present

## 2020-02-03 DIAGNOSIS — M810 Age-related osteoporosis without current pathological fracture: Secondary | ICD-10-CM | POA: Diagnosis present

## 2020-02-03 DIAGNOSIS — D631 Anemia in chronic kidney disease: Secondary | ICD-10-CM | POA: Diagnosis present

## 2020-02-03 DIAGNOSIS — M1990 Unspecified osteoarthritis, unspecified site: Secondary | ICD-10-CM | POA: Diagnosis present

## 2020-02-03 LAB — GLUCOSE, CAPILLARY
Glucose-Capillary: 129 mg/dL — ABNORMAL HIGH (ref 70–99)
Glucose-Capillary: 125 mg/dL — ABNORMAL HIGH (ref 70–99)
Glucose-Capillary: 146 mg/dL — ABNORMAL HIGH (ref 70–99)
Glucose-Capillary: 206 mg/dL — ABNORMAL HIGH (ref 70–99)

## 2020-02-03 LAB — CBC
HCT: 27.6 % — ABNORMAL LOW (ref 36.0–46.0)
Hemoglobin: 8.2 g/dL — ABNORMAL LOW (ref 12.0–15.0)
MCH: 27.1 pg (ref 26.0–34.0)
MCHC: 29.7 g/dL — ABNORMAL LOW (ref 30.0–36.0)
MCV: 91.1 fL (ref 80.0–100.0)
Platelets: 329 10*3/uL (ref 150–400)
RBC: 3.03 MIL/uL — ABNORMAL LOW (ref 3.87–5.11)
RDW: 17.7 % — ABNORMAL HIGH (ref 11.5–15.5)
WBC: 4.9 10*3/uL (ref 4.0–10.5)
nRBC: 0 % (ref 0.0–0.2)

## 2020-02-03 LAB — COMPREHENSIVE METABOLIC PANEL
ALT: 25 U/L (ref 0–44)
AST: 28 U/L (ref 15–41)
Albumin: 3.5 g/dL (ref 3.5–5.0)
Alkaline Phosphatase: 131 U/L — ABNORMAL HIGH (ref 38–126)
Anion gap: 10 (ref 5–15)
BUN: 54 mg/dL — ABNORMAL HIGH (ref 8–23)
CO2: 19 mmol/L — ABNORMAL LOW (ref 22–32)
Calcium: 8.8 mg/dL — ABNORMAL LOW (ref 8.9–10.3)
Chloride: 113 mmol/L — ABNORMAL HIGH (ref 98–111)
Creatinine, Ser: 1.96 mg/dL — ABNORMAL HIGH (ref 0.44–1.00)
GFR calc Af Amer: 28 mL/min — ABNORMAL LOW (ref >60)
GFR calc non Af Amer: 25 mL/min — ABNORMAL LOW (ref >60)
Glucose, Bld: 132 mg/dL — ABNORMAL HIGH (ref 70–99)
Potassium: 5.2 mmol/L — ABNORMAL HIGH (ref 3.5–5.1)
Sodium: 142 mmol/L (ref 135–145)
Total Bilirubin: 0.5 mg/dL (ref 0.3–1.2)
Total Protein: 6.4 g/dL — ABNORMAL LOW (ref 6.5–8.1)

## 2020-02-03 MED ORDER — ALBUTEROL SULFATE (2.5 MG/3ML) 0.083% IN NEBU: 2.5000 mg | INHALATION_SOLUTION | RESPIRATORY_TRACT | Status: DC | PRN

## 2020-02-03 MED ORDER — HYDROCODONE-ACETAMINOPHEN 5-325 MG PO TABS
1.0000 | ORAL_TABLET | ORAL | Status: DC | PRN
  Administered 2020-02-03 – 2020-02-12 (×22): 1 via ORAL
  Filled 2020-02-03 (×22): qty 1

## 2020-02-03 MED ORDER — ADULT MULTIVITAMIN W/MINERALS CH
1.0000 | ORAL_TABLET | Freq: Every day | ORAL | Status: DC
  Administered 2020-02-03 – 2020-02-12 (×9): 1 via ORAL
  Filled 2020-02-03 (×9): qty 1

## 2020-02-03 MED ORDER — IPRATROPIUM-ALBUTEROL 0.5-2.5 (3) MG/3ML IN SOLN
3.0000 mL | Freq: Three times a day (TID) | RESPIRATORY_TRACT | Status: DC
  Administered 2020-02-03 – 2020-02-06 (×11): 3 mL via RESPIRATORY_TRACT
  Filled 2020-02-03 (×10): qty 3

## 2020-02-03 MED ORDER — SODIUM ZIRCONIUM CYCLOSILICATE 10 G PO PACK
10.0000 g | PACK | Freq: Once | ORAL | Status: AC
  Administered 2020-02-03: 10 g via ORAL
  Filled 2020-02-03: qty 1

## 2020-02-03 MED ORDER — BOOST / RESOURCE BREEZE PO LIQD CUSTOM
1.0000 | Freq: Three times a day (TID) | ORAL | Status: DC
  Administered 2020-02-03 – 2020-02-07 (×5): 1 via ORAL

## 2020-02-03 MED ORDER — FUROSEMIDE 40 MG PO TABS
40.0000 mg | ORAL_TABLET | Freq: Every day | ORAL | Status: DC
  Administered 2020-02-03 – 2020-02-04 (×2): 40 mg via ORAL
  Filled 2020-02-03 (×2): qty 1

## 2020-02-03 MED ORDER — FUROSEMIDE 10 MG/ML IJ SOLN: 40.0000 mg | Freq: Every day | INTRAMUSCULAR | Status: DC

## 2020-02-03 NOTE — Progress Notes (Signed)
Initial Nutrition Assessment  DOCUMENTATION CODES:   Underweight  INTERVENTION:  Downgrade diet to dysphagia 3 (chopped meats) due to poor dentition  Boost Breeze po TID, each supplement provides 250 kcal and 9 grams of protein  MVI with minerals daily  NUTRITION DIAGNOSIS:   Increased nutrient needs related to catabolic illness(COPD) as evidenced by estimated needs.   GOAL:   Patient will meet greater than or equal to 90% of their needs    MONITOR:   PO intake, Supplement acceptance, Weight trends, Labs, I & O's  REASON FOR ASSESSMENT:   Consult Assessment of nutrition requirement/status  ASSESSMENT:  74 year old female with past medical history of anxiety, depression, asthma/COPD, active smoker, CAD, CKD, chronic pain, DM2 with neuropathy, fibromyalgia, HOH, HTN, Parkinson's disease, PVD, chronic diarrhea, recurrent upper respiratory infection, spinal stenosis, presented to ED for the 3rd straight day due to progressively worse dyspnea for the past few days, reports using her bronchodilators without significant results at home. CXR showed worsening interstitial opacities favoring edema and small left pleural effusion.  Patient awake alert sitting up in bed and talking on her cell phone this afternoon. Pt asked the person on the other end to hold for a second and placed her cell phone on her chest, limited nutrition history obtained. Patient reports eating most of her lunch, stated it was alright and she ate what she could. She reports varying appetite/intake at home and recalls UBW of 107 lbs, denies any recent weight losses. Weights have been stable over the past 2 years per history.   Patient with poor dentition and agreeable to dysphagia 3 diet (chopped meats). She denied RD offer of Ensure, stated that she does not like anything that taste like milk, amenable to trying Boost Breeze.   Medications reviewed and include: Lasix, SSI, Protonix, Prednisone  Labs: CBGs  146,125,129,130 x 24 hrs, K 5.2 (H), BUN 54 (H), Cr 1.96 (H)  NUTRITION - FOCUSED PHYSICAL EXAM: Limited exam d/t pt continued to stay on her cell phone during visit. Moderate fat depletion to orbital and buccal region, severe muscle depletion to clavicle region   Diet Order:   Diet Order            Diet heart healthy/carb modified Room service appropriate? Yes; Fluid consistency: Thin  Diet effective now              EDUCATION NEEDS:   Not appropriate for education at this time  Skin:     Last BM:  4/5  Height:   Ht Readings from Last 1 Encounters:  02/03/20 5\' 4"  (1.626 m)    Weight:   Wt Readings from Last 1 Encounters:  02/03/20 48.3 kg    Ideal Body Weight:     BMI:  Body mass index is 18.28 kg/m.  Estimated Nutritional Needs:   Kcal:  1450-1700  Protein:  73-85  Fluid:  >/= 1.4 L/day   Lajuan Lines, RD, LDN Clinical Nutrition After Hours/Weekend Pager # in [place]

## 2020-02-03 NOTE — Progress Notes (Signed)
[facility]                                                                              Patient Demographics  [name], is a 74 y.o. female, DOB - [birth year], NID:[id number]  Admit date - 02/02/2020   Admitting Physician Reubin Milan, MD  Outpatient Primary MD for the patient is [name], MD  Outpatient specialists:   LOS - 0  days   Medical records reviewed and are as summarized below:    Chief Complaint  Patient presents with  . Shortness of Breath       Brief summary   Patient is a 74 year old female with anxiety, depression, asthma, COPD, active smoker, carotid artery occlusion, CKD chronic pain of lower extremities, fibromyalgia, spinal stenosis of lumbar region, DJD, history of DVT, CAD, chronic diarrhea, GERD, Graves' disease, chronic headaches, hypertension, Parkinson's disease, PVD diabetes mellitus presented due to progressively worsening dyspnea for the past few days.  Patient reported using her bronchodilators without significant results at home.  Patient received DuoNeb, Solu-Medrol via EMS and was placed on nonrebreather transiently. BNP 1305, chest x-ray showed worsening interstitial opacities favoring edema, small left pleural effusion  Assessment & Plan    Principal Problem:   Acute respiratory failure with hypoxemia (HCC), likely secondary to acute on chronic diastolic CHF -Patient placed on IV Lasix for diuresis, negative balance of 1.1 L, received 1 dose yesterday -Continue strict I's and O's and daily weights, placed on Lasix 40 mg PO daily -Creatinine trended up to 1.9 -Follow 2D echo   Active Problems:  Hyperkalemia -Lokelma x1     Diabetes mellitus with neurological manifestation (HCC) -CBGs uncontrolled due to steroids, continue sliding scale insulin, moderate -Hemoglobin A1c 5.6    HTN (hypertension) -BP currently stable -Continue metoprolol, amlodipine    Depression -Continue Cymbalta    Tremor   -Patient has history of Parkinson's disease, outpatient follow-up with neurology  Dyslipidemia Continue atorvastatin 20 mg p.o. daily.    Gastroesophageal reflux disease without esophagitis Protonix 40 mg p.o. daily.    Pulmonary nodule, left -Outpatient referral by PCP to pulmonology for follow-up appointment     Vitamin B 12 deficiency Has not had a injection in several months. Cyanocobalamin 1000 mcg IM x1 now.  Protein calorie malnutrition Estimated body mass index is 18.28 kg/m as calculated from the following:   Height as of this encounter: 5\' 4"  (1.626 m).   Weight as of this encounter: 48.3 kg.  Code Status: DNR DVT Prophylaxis: Lovenox Family Communication: Discussed all imaging results, lab results, explained to the patient    Disposition Plan: Patient from home, anticipate discharge home once CHF improved, evaluation  Time Spent in minutes 25 minutes  Procedures:  None  Consultants:   None  Antimicrobials:   Anti-infectives (From admission, onward)   None          Medications  Scheduled Meds: . allopurinol  300 mg Oral Daily  . amLODipine  10 mg Oral Daily  . DULoxetine  20 mg Oral Daily  . enoxaparin (LOVENOX) injection  30 mg Subcutaneous Q24H  . insulin aspart  0-15 Units  Subcutaneous TID WC  . ipratropium-albuterol  3 mL Nebulization TID  . metoprolol succinate  25 mg Oral Daily  . nicotine  14 mg Transdermal Daily  . pantoprazole  40 mg Oral BID  . predniSONE  40 mg Oral Q breakfast   Continuous Infusions: PRN Meds:.acetaminophen **OR** acetaminophen, furosemide, guaiFENesin-dextromethorphan, HYDROcodone-acetaminophen, potassium chloride SA, prochlorperazine      Subjective:   [name] was seen and examined today.  No acute complaints, and no chest pain, shortness of breath is improving.  Patient denies dizziness, abdominal pain, N/V/D/C, new weakness, numbess, tingling. No acute events overnight.    Objective:    Vitals:   02/03/20 0811 02/03/20 1020 02/03/20 1331 02/03/20 1348  BP:  (!) 145/82 140/68   Pulse: 81  81 79  Resp: 18  19 (!) 22  Temp:      TempSrc:      SpO2: 98%  97% 96%  Weight:      Height:        Intake/Output Summary (Last 24 hours) at 02/03/2020 1500 Last data filed at 02/03/2020 7124 Gross per 24 hour  Intake 474 ml  Output 1400 ml  Net -926 ml     Wt Readings from Last 3 Encounters:  02/03/20 48.3 kg  02/01/20 48.5 kg  01/31/20 48.5 kg     Exam  General: Alert and oriented x 3, NAD  Cardiovascular: S1 S2 auscultated, no murmurs, RRR  Respiratory: No wheezing, diminished breath sound at the bases  Gastrointestinal: Soft, nontender, nondistended, + bowel sounds  Ext: no pedal edema bilaterally  Neuro: No new deficits  Musculoskeletal: No digital cyanosis, clubbing  Skin: No rashes  Psych: Normal affect and demeanor, alert and oriented x3    Data Reviewed:  I have personally reviewed following labs and imaging studies  Micro Results Recent Results (from the past 240 hour(s))  SARS CORONAVIRUS 2 (TAT 6-24 HRS) Nasopharyngeal Nasopharyngeal Swab     Status: None   Collection Time: 02/02/20  7:54 AM   Specimen: Nasopharyngeal Swab  Result Value Ref Range Status   SARS Coronavirus 2 NEGATIVE NEGATIVE Final    Comment: (NOTE) SARS-CoV-2 target nucleic acids are NOT DETECTED. The SARS-CoV-2 RNA is generally detectable in upper and lower respiratory specimens during the acute phase of infection. Negative results do not preclude SARS-CoV-2 infection, do not rule out co-infections with other pathogens, and should not be used as the sole basis for treatment or other patient management decisions. Negative results must be combined with clinical observations, patient history, and epidemiological information. The expected result is Negative. Fact Sheet for Patients: SugarRoll.be Fact Sheet for Healthcare  Providers: https://www.woods-mathews.com/ This test is not yet approved or cleared by the [place] FDA and  has been authorized for detection and/or diagnosis of SARS-CoV-2 by FDA under an Emergency Use Authorization (EUA). This EUA will remain  in effect (meaning this test can be used) for the duration of the COVID-19 declaration under Section 56 4(b)(1) of the Act, 21 U.S.C. section 360bbb-3(b)(1), unless the authorization is terminated or revoked sooner. Performed at [facility], [place] [address] [place], [place] [address]   MRSA PCR Screening     Status: None   Collection Time: 02/02/20  6:43 PM   Specimen: Nasal Mucosa; Nasopharyngeal  Result Value Ref Range Status   MRSA by PCR NEGATIVE NEGATIVE Final    Comment:        The GeneXpert MRSA Assay (FDA approved for NASAL specimens only), is  one component of a comprehensive MRSA colonization surveillance program. It is not intended to diagnose MRSA infection nor to guide or monitor treatment for MRSA infections. Performed at [facility], [place] [address], [place], [place] [address]     Radiology Reports DG Chest 2 View  Result Date: 02/01/2020 CLINICAL DATA:  Shortness of breath. EXAM: CHEST - 2 VIEW COMPARISON:  01/31/2020 FINDINGS: The cardiac silhouette, mediastinal and hilar contours are stable. Stable underlying chronic lung disease with emphysema and pulmonary scarring. Persistent left pleural effusion and overlying atelectasis and scarring. Stable vague rounded density in the left upper lobe. No definite acute pulmonary findings. IMPRESSION: Chronic lung disease with persistent left pleural effusion and overlying atelectasis and scarring. No definite acute overlying pulmonary process. Electronically Signed   By: Marijo Sanes M.D.   On: 02/01/2020 05:37   DG Chest 2 View  Result Date: 01/31/2020 CLINICAL DATA:  COPD exacerbation. EXAM: CHEST - 2 VIEW COMPARISON:  12/22/2019  FINDINGS: Left lung base opacity, similar to the prior exam, consistent with a small pleural effusion and associated lung base atelectasis or scarring. Lungs are hyperexpanded. There are chronic bronchitic changes at both lung bases, as well as bilateral interstitial thickening, findings are stable from the prior study. There is a nodular opacity that projects over the medial left upper lobe, which may be due to a combination of superimposed osseous structures, but raises possibility of a true lung nodule. This was not evident previously. No pneumothorax. Cardiac silhouette is normal in size. No mediastinal or hilar masses or evidence of adenopathy. Skeletal structures are intact. IMPRESSION: 1. No convincing acute cardiopulmonary disease. 2. There are significant chronic findings including left lung base opacity that is consistent with a combination of pleural fluid and atelectasis or scarring, bilateral basilar bronchitic changes bilateral interstitial thickening. Lungs are hyperexpanded consistent with COPD. 3. Possible nodule in the medial left upper lobe. Recommend follow-up unenhanced chest CT on a nonemergent basis for further assessment. Electronically Signed   By: Lajean Manes M.D.   On: 01/31/2020 06:00   CT Chest Wo Contrast  Result Date: 02/02/2020 CLINICAL DATA:  shortness of breath and abnormal chest x-ray EXAM: CT CHEST WITHOUT CONTRAST TECHNIQUE: Multidetector CT imaging of the chest was performed following the standard protocol without IV contrast. COMPARISON:  10/17/2017 FINDINGS: Cardiovascular: Borderline heart size. No pericardial effusion. There is aortic and coronary atherosclerosis. No acute vascular finding without contrast Mediastinum/Nodes: Increase in size of mediastinal lymph nodes since 2018 but strictly still within normal limits at 15 mm or less in short axis, largest located at the precarinal station. Surgical clips around the GE junction, likely hernia repair. Progressive venous  collaterals in the chest wall and upper mediastinum. The left brachiocephalic vein is collapsed. Lungs/Pleura: Small bilateral pleural effusion which is dependent and layering. There is streaky density with volume loss at the lung bases. Scar-like appearance with equivocal cylindrical bronchiectasis at the medial segment right middle lobe. Mild generalized interlobular septal thickening at the lung bases. Subpleural nodule in the left upper lobe measuring 16 mm, new from 2018 when it was only a tiny subpleural nodule. Upper Abdomen: No acute finding. Musculoskeletal: No acute finding IMPRESSION: 1. 16 mm nodule in the subpleural left upper lobe, worrisome for bronchogenic carcinoma. Recommend referral to multi disciplinary thoracic oncology. 2. Small pleural effusions and mild interstitial edema. Atelectasis is seen at both lung bases. 3. Progressive collaterals in the chest wall associated with left brachiocephalic vein occlusion/collapse. This may account for  mild interval enlargement of mediastinal lymph nodes. 4.  Aortic Atherosclerosis (ICD10-I70.0).  Coronary atherosclerosis. Electronically Signed   By: Monte Fantasia M.D.   On: 02/02/2020 07:36   DG Chest Port 1 View  Result Date: 02/02/2020 CLINICAL DATA:  Shortness of breath EXAM: PORTABLE CHEST 1 VIEW COMPARISON:  Yesterday FINDINGS: Cardiomegaly with hazy and interstitial opacity. Small left pleural effusion. No pneumothorax. Postoperative left neck. IMPRESSION: Worsening interstitial opacity favoring edema. Small left pleural effusion. Electronically Signed   By: Monte Fantasia M.D.   On: 02/02/2020 05:52    Lab Data:  CBC: Recent Labs  Lab 01/31/20 0550 02/01/20 0725 02/02/20 0546 02/03/20 0517  WBC 11.9* 12.2* 9.5 4.9  NEUTROABS 10.9* 8.2* 8.3*  --   HGB 8.5* 8.9* 8.3* 8.2*  HCT 28.2* 31.5* 28.3* 27.6*  MCV 91.3 94.3 91.3 91.1  PLT 293 408* 311 209   Basic Metabolic Panel: Recent Labs  Lab 01/31/20 0550 02/01/20 0725  02/02/20 0546 02/03/20 0517  NA 142 143 141 142  K 4.1 4.3 4.1 5.2*  CL 116* 114* 114* 113*  CO2 17* 15* 16* 19*  GLUCOSE 120* 110* 124* 132*  BUN 34* 34* 39* 54*  CREATININE 1.20* 1.46* 1.54* 1.96*  CALCIUM 8.5* 8.5* 8.4* 8.8*  MG  --   --  2.5*  --   PHOS  --   --  4.5  --    GFR: Estimated Creatinine Clearance: 19.2 mL/min (A) (by C-G formula based on SCr of 1.96 mg/dL (H)). Liver Function Tests: Recent Labs  Lab 01/31/20 0550 02/03/20 0517  AST 14* 28  ALT 10 25  ALKPHOS 136* 131*  BILITOT 0.6 0.5  PROT 6.5 6.4*  ALBUMIN 3.4* 3.5   No results for input(s): LIPASE, AMYLASE in the last 168 hours. No results for input(s): AMMONIA in the last 168 hours. Coagulation Profile: No results for input(s): INR, PROTIME in the last 168 hours. Cardiac Enzymes: No results for input(s): CKTOTAL, CKMB, CKMBINDEX, TROPONINI in the last 168 hours. BNP (last 3 results) No results for input(s): PROBNP in the last 8760 hours. HbA1C: Recent Labs    02/02/20 0958  HGBA1C 5.6   CBG: Recent Labs  Lab 02/02/20 1143 02/02/20 1644 02/02/20 2122 02/03/20 0747 02/03/20 1141  GLUCAP 145* 167* 130* 129* 125*   Lipid Profile: No results for input(s): CHOL, HDL, LDLCALC, TRIG, CHOLHDL, LDLDIRECT in the last 72 hours. Thyroid Function Tests: No results for input(s): TSH, T4TOTAL, FREET4, T3FREE, THYROIDAB in the last 72 hours. Anemia Panel: No results for input(s): VITAMINB12, FOLATE, FERRITIN, TIBC, IRON, RETICCTPCT in the last 72 hours. Urine analysis:    Component Value Date/Time   COLORURINE STRAW (A) 11/04/2019 0911   APPEARANCEUR CLEAR 11/04/2019 0911   LABSPEC 1.010 11/04/2019 0911   PHURINE 5.0 11/04/2019 0911   GLUCOSEU NEGATIVE 11/04/2019 0911   HGBUR NEGATIVE 11/04/2019 0911   BILIRUBINUR NEGATIVE 11/04/2019 0911   KETONESUR NEGATIVE 11/04/2019 0911   PROTEINUR NEGATIVE 11/04/2019 0911   UROBILINOGEN 0.2 07/28/2015 2223   NITRITE NEGATIVE 11/04/2019 0911    LEUKOCYTESUR MODERATE (A) 11/04/2019 0911     Kanita Delage M.D. [facility] 02/03/2020, 3:00 PM   Call night coverage person covering after 7pm

## 2020-02-04 ENCOUNTER — Inpatient Hospital Stay (HOSPITAL_COMMUNITY): Payer: Medicare HMO

## 2020-02-04 DIAGNOSIS — I10 Essential (primary) hypertension: Secondary | ICD-10-CM

## 2020-02-04 DIAGNOSIS — I361 Nonrheumatic tricuspid (valve) insufficiency: Secondary | ICD-10-CM

## 2020-02-04 DIAGNOSIS — R0602 Shortness of breath: Secondary | ICD-10-CM

## 2020-02-04 DIAGNOSIS — I340 Nonrheumatic mitral (valve) insufficiency: Secondary | ICD-10-CM

## 2020-02-04 DIAGNOSIS — I2510 Atherosclerotic heart disease of native coronary artery without angina pectoris: Secondary | ICD-10-CM

## 2020-02-04 DIAGNOSIS — I5033 Acute on chronic diastolic (congestive) heart failure: Secondary | ICD-10-CM

## 2020-02-04 DIAGNOSIS — J441 Chronic obstructive pulmonary disease with (acute) exacerbation: Secondary | ICD-10-CM

## 2020-02-04 DIAGNOSIS — J9601 Acute respiratory failure with hypoxia: Secondary | ICD-10-CM

## 2020-02-04 LAB — BASIC METABOLIC PANEL
Anion gap: 12 (ref 5–15)
BUN: 61 mg/dL — ABNORMAL HIGH (ref 8–23)
CO2: 22 mmol/L (ref 22–32)
Calcium: 8.1 mg/dL — ABNORMAL LOW (ref 8.9–10.3)
Chloride: 106 mmol/L (ref 98–111)
Creatinine, Ser: 1.93 mg/dL — ABNORMAL HIGH (ref 0.44–1.00)
GFR calc Af Amer: 29 mL/min — ABNORMAL LOW (ref >60)
GFR calc non Af Amer: 25 mL/min — ABNORMAL LOW (ref >60)
Glucose, Bld: 98 mg/dL (ref 70–99)
Potassium: 3.7 mmol/L (ref 3.5–5.1)
Sodium: 140 mmol/L (ref 135–145)

## 2020-02-04 LAB — ECHOCARDIOGRAM COMPLETE
Height: 64 in
Weight: 1721.35 oz

## 2020-02-04 LAB — GLUCOSE, CAPILLARY
Glucose-Capillary: 95 mg/dL (ref 70–99)
Glucose-Capillary: 190 mg/dL — ABNORMAL HIGH (ref 70–99)
Glucose-Capillary: 137 mg/dL — ABNORMAL HIGH (ref 70–99)
Glucose-Capillary: 233 mg/dL — ABNORMAL HIGH (ref 70–99)

## 2020-02-04 MED ORDER — ARFORMOTEROL TARTRATE 15 MCG/2ML IN NEBU
15.0000 ug | INHALATION_SOLUTION | Freq: Two times a day (BID) | RESPIRATORY_TRACT | Status: DC
  Administered 2020-02-04 – 2020-02-12 (×17): 15 ug via RESPIRATORY_TRACT
  Filled 2020-02-04 (×17): qty 2

## 2020-02-04 MED ORDER — DOXYCYCLINE HYCLATE 100 MG PO TABS
100.0000 mg | ORAL_TABLET | Freq: Two times a day (BID) | ORAL | Status: DC
  Administered 2020-02-04 – 2020-02-12 (×17): 100 mg via ORAL
  Filled 2020-02-04 (×17): qty 1

## 2020-02-04 MED ORDER — METHYLPREDNISOLONE SODIUM SUCC 125 MG IJ SOLR
60.0000 mg | Freq: Two times a day (BID) | INTRAMUSCULAR | Status: DC
  Administered 2020-02-04 – 2020-02-06 (×4): 60 mg via INTRAVENOUS
  Filled 2020-02-04 (×4): qty 2

## 2020-02-04 MED ORDER — BUDESONIDE 0.25 MG/2ML IN SUSP: 0.2500 mg | Freq: Two times a day (BID) | RESPIRATORY_TRACT | Status: DC

## 2020-02-04 MED ORDER — FUROSEMIDE 10 MG/ML IJ SOLN
20.0000 mg | Freq: Once | INTRAMUSCULAR | Status: AC
  Administered 2020-02-04: 18:00:00 20 mg via INTRAVENOUS
  Filled 2020-02-04: qty 2

## 2020-02-04 MED ORDER — BUDESONIDE 0.5 MG/2ML IN SUSP
0.5000 mg | Freq: Two times a day (BID) | RESPIRATORY_TRACT | Status: DC
  Administered 2020-02-04 – 2020-02-12 (×17): 0.5 mg via RESPIRATORY_TRACT
  Filled 2020-02-04 (×17): qty 2

## 2020-02-04 MED ORDER — SALINE SPRAY 0.65 % NA SOLN
1.0000 | NASAL | Status: DC | PRN
  Filled 2020-02-04: qty 44

## 2020-02-04 MED ORDER — ALLOPURINOL 100 MG PO TABS
100.0000 mg | ORAL_TABLET | Freq: Every day | ORAL | Status: DC
  Administered 2020-02-05 – 2020-02-12 (×7): 100 mg via ORAL
  Filled 2020-02-04 (×7): qty 1

## 2020-02-04 MED ORDER — BENZONATATE 100 MG PO CAPS
100.0000 mg | ORAL_CAPSULE | Freq: Three times a day (TID) | ORAL | Status: DC
  Administered 2020-02-04 – 2020-02-12 (×25): 100 mg via ORAL
  Filled 2020-02-04 (×25): qty 1

## 2020-02-04 NOTE — Progress Notes (Signed)
[facility]                                                                              Patient Demographics  [name], is a 74 y.o. female, DOB - 1946/08/28, DSK:[id number]  Admit date - 02/02/2020   Admitting Physician Reubin Milan, MD  Outpatient Primary MD for the patient is [name], MD  Outpatient specialists:   LOS - 1  days   Medical records reviewed and are as summarized below:    Chief Complaint  Patient presents with  . Shortness of Breath       Brief summary   Patient is a 74 year old female with anxiety, depression, asthma, COPD, active smoker, carotid artery occlusion, CKD chronic pain of lower extremities, fibromyalgia, spinal stenosis of lumbar region, DJD, history of DVT, CAD, chronic diarrhea, GERD, Graves' disease, chronic headaches, hypertension, Parkinson's disease, PVD diabetes mellitus presented due to progressively worsening dyspnea for the past few days.  Patient reported using her bronchodilators without significant results at home.  Patient received DuoNeb, Solu-Medrol via EMS and was placed on nonrebreather transiently. BNP 1305, chest x-ray showed worsening interstitial opacities favoring edema, small left pleural effusion  Assessment & Plan    Principal Problem:   Acute respiratory failure with hypoxemia ([place]), likely secondary to acute on chronic diastolic CHF -Patient placed on IV Lasix for diuresis, balance of 1.59 L -Continue strict I's and O's and daily weights.  Given recurrent admissions, cardiology was consulted, appreciate recommendations, will arrange outpatient stress test -Hold off on Lasix for now, creatinine 1.9 -2D echo showed EF of 50 to 62%, grade 1 diastolic dysfunction, mildly elevated PA pressure   Active Problems:  Severe COPD exacerbation -Patient required NRB transiently and pause given IV steroids, nebs by EMS -Placed on IV steroids 60 mg every 12 hours, Pulmicort, Brovana, flutter  valve -Continue DuoNebs, Tessalon Perles, Robitussin -Coughing, wheezing during examination, placed on oral doxycycline   Pulmonary nodule, incidentally seen on CT chest -CT chest showed 16 mm nodule in the subpleural left upper lobe worrisome for bronchogenic carcinoma -Spoke with Dr. [place] Bells, will evaluate patient for further work-up   Hyperkalemia -Received Lokelma on 4/7, potassium 3.7 today    Diabetes mellitus, type II with hyperglycemia uncontrolled -CBGs likely uncontrolled due to steroids.  Hemoglobin A1c 5.6 -Continue sliding scale insulin     HTN (hypertension) -BP currently stable Continue amlodipine, metoprolol    Depression -Continue Cymbalta    Tremor  -Patient has history of Parkinson's disease, outpatient follow-up with neurology  Dyslipidemia Continue atorvastatin 20 mg p.o. daily.    Gastroesophageal reflux disease without esophagitis Protonix 40 mg p.o. daily.    Vitamin B 12 deficiency Has not had a injection in several months. Received cyanocobalamin 1000 mcg IM x1   Protein calorie malnutrition Estimated body mass index is 18.47 kg/m as calculated from the following:   Height as of this encounter: 5\' 4"  (1.626 m).   Weight as of this encounter: 48.8 kg.  Code Status: DNR DVT Prophylaxis: Lovenox Family Communication: Discussed all imaging results, lab results, explained to the patient    Disposition Plan: Patient from  home, anticipate discharge home once respiratory status is improved, waiting pulmonology evaluation.    Time Spent in minutes 25 minutes  Procedures:  None  Consultants:   None  Antimicrobials:   Anti-infectives (From admission, onward)   None         Medications  Scheduled Meds: . [START ON 02/05/2020] allopurinol  100 mg Oral Daily  . amLODipine  10 mg Oral Daily  . arformoterol  15 mcg Nebulization BID  . budesonide (PULMICORT) nebulizer solution  0.25 mg Nebulization BID  . DULoxetine  20 mg Oral  Daily  . enoxaparin (LOVENOX) injection  30 mg Subcutaneous Q24H  . feeding supplement  1 Container Oral TID BM  . insulin aspart  0-15 Units Subcutaneous TID WC  . ipratropium-albuterol  3 mL Nebulization TID  . methylPREDNISolone (SOLU-MEDROL) injection  60 mg Intravenous Q12H  . metoprolol succinate  25 mg Oral Daily  . multivitamin with minerals  1 tablet Oral Daily  . nicotine  14 mg Transdermal Daily  . pantoprazole  40 mg Oral BID   Continuous Infusions: PRN Meds:.acetaminophen **OR** acetaminophen, guaiFENesin-dextromethorphan, HYDROcodone-acetaminophen, prochlorperazine, sodium chloride      Subjective:   [name] was seen and examined today.  Coughing and some wheezing noted during examination.  Still feels short of breath, no chest pain.  Overnight no acute issues, no fevers or chills.    Patient denies dizziness, abdominal pain, N/V/D/C, new weakness, numbess, tingling  Objective:   Vitals:   02/04/20 0548 02/04/20 0804 02/04/20 1403 02/04/20 1422  BP: 119/68  127/65   Pulse: 76  73   Resp: 20  19   Temp: 97.8 F (36.6 C)  98.2 F (36.8 C)   TempSrc: Oral  Oral   SpO2: 98% 96% 99% 98%  Weight: 48.8 kg     Height:        Intake/Output Summary (Last 24 hours) at 02/04/2020 1447 Last data filed at 02/04/2020 1100 Gross per 24 hour  Intake 480 ml  Output 900 ml  Net -420 ml     Wt Readings from Last 3 Encounters:  02/04/20 48.8 kg  02/01/20 48.5 kg  01/31/20 48.5 kg    Physical Exam  General: Alert and oriented x 3, ill-appearing  Cardiovascular: S1 S2 clear, RRR. No pedal edema b/l  Respiratory: Bilateral scattered wheezing, diminished breath sound at the bases  Gastrointestinal: Soft, nontender, nondistended, NBS  Ext: no pedal edema bilaterally  Neuro: no new deficits  Musculoskeletal: No cyanosis, clubbing  Skin: No rashes  Psych: Normal affect and demeanor, alert and oriented x3    Data Reviewed:  I have personally reviewed  following labs and imaging studies  Micro Results Recent Results (from the past 240 hour(s))  SARS CORONAVIRUS 2 (TAT 6-24 HRS) Nasopharyngeal Nasopharyngeal Swab     Status: None   Collection Time: 02/02/20  7:54 AM   Specimen: Nasopharyngeal Swab  Result Value Ref Range Status   SARS Coronavirus 2 NEGATIVE NEGATIVE Final    Comment: (NOTE) SARS-CoV-2 target nucleic acids are NOT DETECTED. The SARS-CoV-2 RNA is generally detectable in upper and lower respiratory specimens during the acute phase of infection. Negative results do not preclude SARS-CoV-2 infection, do not rule out co-infections with other pathogens, and should not be used as the sole basis for treatment or other patient management decisions. Negative results must be combined with clinical observations, patient history, and epidemiological information. The expected result is Negative. Fact Sheet for Patients: SugarRoll.be Fact Sheet for  Healthcare Providers: https://www.woods-mathews.com/ This test is not yet approved or cleared by the [place] and  has been authorized for detection and/or diagnosis of SARS-CoV-2 by FDA under an Emergency Use Authorization (EUA). This EUA will remain  in effect (meaning this test can be used) for the duration of the COVID-19 declaration under Section 56 4(b)(1) of the Act, 21 U.S.C. section 360bbb-3(b)(1), unless the authorization is terminated or revoked sooner. Performed at [facility], [place] [address], [place], [place] [address]   MRSA PCR Screening     Status: None   Collection Time: 02/02/20  6:43 PM   Specimen: Nasal Mucosa; Nasopharyngeal  Result Value Ref Range Status   MRSA by PCR NEGATIVE NEGATIVE Final    Comment:        The GeneXpert MRSA Assay (FDA approved for NASAL specimens only), is one component of a comprehensive MRSA colonization surveillance program. It is not intended to diagnose MRSA infection nor  to guide or monitor treatment for MRSA infections. Performed at [facility], [place] [address] [place], [place] [address]     Radiology Reports DG Chest 2 View  Result Date: 02/01/2020 CLINICAL DATA:  Shortness of breath. EXAM: CHEST - 2 VIEW COMPARISON:  01/31/2020 FINDINGS: The cardiac silhouette, mediastinal and hilar contours are stable. Stable underlying chronic lung disease with emphysema and pulmonary scarring. Persistent left pleural effusion and overlying atelectasis and scarring. Stable vague rounded density in the left upper lobe. No definite acute pulmonary findings. IMPRESSION: Chronic lung disease with persistent left pleural effusion and overlying atelectasis and scarring. No definite acute overlying pulmonary process. Electronically Signed   By: Marijo Sanes M.D.   On: 02/01/2020 05:37   DG Chest 2 View  Result Date: 01/31/2020 CLINICAL DATA:  COPD exacerbation. EXAM: CHEST - 2 VIEW COMPARISON:  12/22/2019 FINDINGS: Left lung base opacity, similar to the prior exam, consistent with a small pleural effusion and associated lung base atelectasis or scarring. Lungs are hyperexpanded. There are chronic bronchitic changes at both lung bases, as well as bilateral interstitial thickening, findings are stable from the prior study. There is a nodular opacity that projects over the medial left upper lobe, which may be due to a combination of superimposed osseous structures, but raises possibility of a true lung nodule. This was not evident previously. No pneumothorax. Cardiac silhouette is normal in size. No mediastinal or hilar masses or evidence of adenopathy. Skeletal structures are intact. IMPRESSION: 1. No convincing acute cardiopulmonary disease. 2. There are significant chronic findings including left lung base opacity that is consistent with a combination of pleural fluid and atelectasis or scarring, bilateral basilar bronchitic changes bilateral interstitial thickening.  Lungs are hyperexpanded consistent with COPD. 3. Possible nodule in the medial left upper lobe. Recommend follow-up unenhanced chest CT on a nonemergent basis for further assessment. Electronically Signed   By: Lajean Manes M.D.   On: 01/31/2020 06:00   CT Chest Wo Contrast  Result Date: 02/02/2020 CLINICAL DATA:  shortness of breath and abnormal chest x-ray EXAM: CT CHEST WITHOUT CONTRAST TECHNIQUE: Multidetector CT imaging of the chest was performed following the standard protocol without IV contrast. COMPARISON:  10/17/2017 FINDINGS: Cardiovascular: Borderline heart size. No pericardial effusion. There is aortic and coronary atherosclerosis. No acute vascular finding without contrast Mediastinum/Nodes: Increase in size of mediastinal lymph nodes since 2018 but strictly still within normal limits at 15 mm or less in short axis, largest located at the precarinal station. Surgical clips around the  GE junction, likely hernia repair. Progressive venous collaterals in the chest wall and upper mediastinum. The left brachiocephalic vein is collapsed. Lungs/Pleura: Small bilateral pleural effusion which is dependent and layering. There is streaky density with volume loss at the lung bases. Scar-like appearance with equivocal cylindrical bronchiectasis at the medial segment right middle lobe. Mild generalized interlobular septal thickening at the lung bases. Subpleural nodule in the left upper lobe measuring 16 mm, new from 2018 when it was only a tiny subpleural nodule. Upper Abdomen: No acute finding. Musculoskeletal: No acute finding IMPRESSION: 1. 16 mm nodule in the subpleural left upper lobe, worrisome for bronchogenic carcinoma. Recommend referral to multi disciplinary thoracic oncology. 2. Small pleural effusions and mild interstitial edema. Atelectasis is seen at both lung bases. 3. Progressive collaterals in the chest wall associated with left brachiocephalic vein occlusion/collapse. This may account for mild  interval enlargement of mediastinal lymph nodes. 4.  Aortic Atherosclerosis (ICD10-I70.0).  Coronary atherosclerosis. Electronically Signed   By: Monte Fantasia M.D.   On: 02/02/2020 07:36   DG Chest Port 1 View  Result Date: 02/02/2020 CLINICAL DATA:  Shortness of breath EXAM: PORTABLE CHEST 1 VIEW COMPARISON:  Yesterday FINDINGS: Cardiomegaly with hazy and interstitial opacity. Small left pleural effusion. No pneumothorax. Postoperative left neck. IMPRESSION: Worsening interstitial opacity favoring edema. Small left pleural effusion. Electronically Signed   By: Monte Fantasia M.D.   On: 02/02/2020 05:52   ECHOCARDIOGRAM COMPLETE  Result Date: 02/04/2020    ECHOCARDIOGRAM REPORT   Patient Name:   [name] Date of Exam: 02/04/2020 Medical Rec #:  [id number]       Height:       64.0 in Accession #:    [id number]      Weight:       107.6 lb Date of Birth:  Jun 06, 1946        BSA:          1.503 m Patient Age:    77 years        BP:           119/68 mmHg Patient Gender: F               HR:           76 bpm. Exam Location:  Inpatient Procedure: 2D Echo Indications:    Congestive Heart Failure 428.0 / I50.9  History:        Patient has prior history of Echocardiogram examinations, most                 recent 11/04/2019. COPD; Risk Factors:Tobacco use, Hypertension,                 Diabetes and Dyslipidemia. AKI                 CKD.  Sonographer:    Vikki Ports Turrentine Referring Phys: [id number] Marianne Golightly K Raphael Fitzpatrick IMPRESSIONS  1. Left ventricular ejection fraction, by estimation, is 50 to 55%. The left ventricle has low normal function. The left ventricle has no regional wall motion abnormalities. Left ventricular diastolic parameters are consistent with Grade I diastolic dysfunction (impaired relaxation).  2. Right ventricular systolic function is normal. The right ventricular size is normal. There is mildly elevated pulmonary artery systolic pressure. The estimated right ventricular systolic pressure is 77.4 mmHg.  3. The  mitral valve is myxomatous. Mild to moderate mitral valve regurgitation. No evidence of mitral stenosis.  4. The aortic valve is normal in structure. Aortic valve  regurgitation is not visualized. No aortic stenosis is present.  5. The inferior vena cava is normal in size with greater than 50% respiratory variability, suggesting right atrial pressure of 3 mmHg. Comparison(s): Prior images reviewed side by side. Mitral regurgitation has increased when compared to prior study. FINDINGS  Left Ventricle: Left ventricular ejection fraction, by estimation, is 50 to 55%. The left ventricle has low normal function. The left ventricle has no regional wall motion abnormalities. The left ventricular internal cavity size was normal in size. There is no left ventricular hypertrophy. Left ventricular diastolic parameters are consistent with Grade I diastolic dysfunction (impaired relaxation). Right Ventricle: The right ventricular size is normal. No increase in right ventricular wall thickness. Right ventricular systolic function is normal. There is mildly elevated pulmonary artery systolic pressure. The tricuspid regurgitant velocity is 2.74  m/s, and with an assumed right atrial pressure of 3 mmHg, the estimated right ventricular systolic pressure is 71.2 mmHg. Left Atrium: Left atrial size was normal in size. Right Atrium: Right atrial size was normal in size. Pericardium: There is no evidence of pericardial effusion. Mitral Valve: The mitral valve is myxomatous. There is moderate thickening of the mitral valve leaflet(s). Normal mobility of the mitral valve leaflets. Mild to moderate mitral valve regurgitation. No evidence of mitral valve stenosis. Tricuspid Valve: The tricuspid valve is normal in structure. Tricuspid valve regurgitation is mild . No evidence of tricuspid stenosis. Aortic Valve: The aortic valve is normal in structure. Aortic valve regurgitation is not visualized. No aortic stenosis is present. Pulmonic Valve:  The pulmonic valve was normal in structure. Pulmonic valve regurgitation is not visualized. No evidence of pulmonic stenosis. Aorta: The aortic root is normal in size and structure. Venous: The inferior vena cava is normal in size with greater than 50% respiratory variability, suggesting right atrial pressure of 3 mmHg. IAS/Shunts: No atrial level shunt detected by color flow Doppler.  LEFT VENTRICLE PLAX 2D LVIDd:         4.50 cm  Diastology LVIDs:         3.30 cm  LV e' lateral:   6.09 cm/s LV PW:         0.90 cm  LV E/e' lateral: 17.4 LV IVS:        0.90 cm  LV e' medial:    6.09 cm/s LVOT diam:     1.90 cm  LV E/e' medial:  17.4 LV SV:         50 LV SV Index:   34 LVOT Area:     2.84 cm  RIGHT VENTRICLE RV S prime:     12.80 cm/s TAPSE (M-mode): 2.0 cm LEFT ATRIUM             Index       RIGHT ATRIUM           Index LA diam:        3.10 cm 2.06 cm/m  RA Area:     12.20 cm LA Vol (A2C):   40.8 ml 27.14 ml/m RA Volume:   27.50 ml  18.29 ml/m LA Vol (A4C):   39.7 ml 26.41 ml/m LA Biplane Vol: 41.0 ml 27.28 ml/m  AORTIC VALVE LVOT Vmax:   86.80 cm/s LVOT Vmean:  67.800 cm/s LVOT VTI:    0.178 m  AORTA Ao Root diam: 2.60 cm MITRAL VALVE                TRICUSPID VALVE MV Area (PHT): 3.83 cm  TR Peak grad:   30.0 mmHg MV Decel Time: 198 msec     TR Vmax:        274.00 cm/s MV E velocity: 106.00 cm/s MV A velocity: 117.00 cm/s  SHUNTS MV E/A ratio:  0.91         Systemic VTI:  0.18 m                             Systemic Diam: 1.90 cm Candee Furbish MD Electronically signed by Candee Furbish MD Signature Date/Time: 02/04/2020/11:53:29 AM    Final     Lab Data:  CBC: Recent Labs  Lab 01/31/20 0550 02/01/20 0725 02/02/20 0546 02/03/20 0517  WBC 11.9* 12.2* 9.5 4.9  NEUTROABS 10.9* 8.2* 8.3*  --   HGB 8.5* 8.9* 8.3* 8.2*  HCT 28.2* 31.5* 28.3* 27.6*  MCV 91.3 94.3 91.3 91.1  PLT 293 408* 311 191   Basic Metabolic Panel: Recent Labs  Lab 01/31/20 0550 02/01/20 0725 02/02/20 0546 02/03/20 0517  02/04/20 0518  NA 142 143 141 142 140  K 4.1 4.3 4.1 5.2* 3.7  CL 116* 114* 114* 113* 106  CO2 17* 15* 16* 19* 22  GLUCOSE 120* 110* 124* 132* 98  BUN 34* 34* 39* 54* 61*  CREATININE 1.20* 1.46* 1.54* 1.96* 1.93*  CALCIUM 8.5* 8.5* 8.4* 8.8* 8.1*  MG  --   --  2.5*  --   --   PHOS  --   --  4.5  --   --    GFR: Estimated Creatinine Clearance: 19.7 mL/min (A) (by C-G formula based on SCr of 1.93 mg/dL (H)). Liver Function Tests: Recent Labs  Lab 01/31/20 0550 02/03/20 0517  AST 14* 28  ALT 10 25  ALKPHOS 136* 131*  BILITOT 0.6 0.5  PROT 6.5 6.4*  ALBUMIN 3.4* 3.5   No results for input(s): LIPASE, AMYLASE in the last 168 hours. No results for input(s): AMMONIA in the last 168 hours. Coagulation Profile: No results for input(s): INR, PROTIME in the last 168 hours. Cardiac Enzymes: No results for input(s): CKTOTAL, CKMB, CKMBINDEX, TROPONINI in the last 168 hours. BNP (last 3 results) No results for input(s): PROBNP in the last 8760 hours. HbA1C: Recent Labs    02/02/20 0958  HGBA1C 5.6   CBG: Recent Labs  Lab 02/03/20 1141 02/03/20 1603 02/03/20 2055 02/04/20 0745 02/04/20 1225  GLUCAP 125* 146* 206* 95 190*   Lipid Profile: No results for input(s): CHOL, HDL, LDLCALC, TRIG, CHOLHDL, LDLDIRECT in the last 72 hours. Thyroid Function Tests: No results for input(s): TSH, T4TOTAL, FREET4, T3FREE, THYROIDAB in the last 72 hours. Anemia Panel: No results for input(s): VITAMINB12, FOLATE, FERRITIN, TIBC, IRON, RETICCTPCT in the last 72 hours. Urine analysis:    Component Value Date/Time   COLORURINE STRAW (A) 11/04/2019 0911   APPEARANCEUR CLEAR 11/04/2019 0911   LABSPEC 1.010 11/04/2019 0911   PHURINE 5.0 11/04/2019 0911   GLUCOSEU NEGATIVE 11/04/2019 0911   HGBUR NEGATIVE 11/04/2019 0911   BILIRUBINUR NEGATIVE 11/04/2019 0911   KETONESUR NEGATIVE 11/04/2019 0911   PROTEINUR NEGATIVE 11/04/2019 0911   UROBILINOGEN 0.2 07/28/2015 2223   NITRITE NEGATIVE  11/04/2019 0911   LEUKOCYTESUR MODERATE (A) 11/04/2019 0911     Easton Fetty M.D. [facility] 02/04/2020, 2:47 PM   Call night coverage person covering after 7pm

## 2020-02-04 NOTE — Progress Notes (Signed)
PT demonstrates hands on understanding of Flutter device- PC at this time.

## 2020-02-04 NOTE — Progress Notes (Signed)
  Echocardiogram 2D Echocardiogram has been performed.  [name] [name] 02/04/2020, 8:47 AM

## 2020-02-04 NOTE — Consult Note (Signed)
Cardiology Consultation:   Patient ID: [name] MRN: [id number]; DOB: Jul 11, 1946  Admit date: 02/02/2020 Date of Consult: 02/04/2020  Primary Care Provider: [name], MD Primary Cardiologist: Lynn Dawley, MD  Primary Electrophysiologist:  None    Patient Profile:   [name] is Young 74 y.o. female with Young hx of Chronic diastolic heart failure, PAD, Parkinsons, HTN, carotid artery occlusion, CKD stage 4, chronic lower extremity pain, fibromyalgia, GERD, Graves disease, COPD, sleep apnea not on CPAP who is being seen today for the evaluation of SOB at the request of Dr. Tana Young.  History of Present Illness:   [name] was recently seen January 2021 during an admission during acute heart failure and COPD. Echo at that time showed EF 60-65%, moderate LVH, trivial MR. Troponin was mildly elevated but no plan for ischemic evaluation. Patient was treated with IV diuretics and discharged home on lasix 40 as needed.   The patient was seen in the ED 4/4 for sob. She apparently ran out of her albuterol inhaler recently. She was seen again in the ED 4/5 for COPD exacerbation. CXR ws at baseline an dBNP mildly elevated. She was given prednisone and duoneb.   The patient presented to the ED 02/02/20 for Young shortness of breath. Symptoms started Young couple days prior. She was using her inhalers without improvement. She woke up with significant sob she called EMS. She denied increased salt intake but does drink Young lot of fluid. She has not been taking lasix every day. Did not notice significant weight gain or lower leg swelling. Denies abdominal fullness. EMS administered duoneb and IV solu-medrol. She was placed on nonrebreather transiently. Denied chest pain, nausea, vomiting, palpitations, dizziness, fever, chills.   In the ED patient was afebrile, pulse 96, RR 24, BP 168/84. She was placed on supplemental O2. She was given magnesium sulfate and bronchodilators. Labs showed WBC 9.5, Hgb 8.3.  Potassium 4.1, CO2 16, glucose 124, creatinine 1.54. BNP elevated to 1305, CXR showed worsening interstitial opacities favoring edema, small left pleural effusion.   Patient is feeling much better today. She is on 1L O2. Denies chest pain. Euvolemic on exam. She plans on quitting smoking. She doesn't use O2 at home but feels she might. She lives alone and has help occasionally from Young young man. She has no family in the are and needs help with daily activities. She uses Young walker at home and often feels sob.    Past Medical History:  Diagnosis Date  . Anxiety and depression   . Arthritis   . Asthma   . Carotid artery occlusion   . Chronic kidney disease   . Chronic pain    leg and feet  . COPD (chronic obstructive pulmonary disease) ([place])   . Coronary artery disease   . DDD (degenerative disc disease)   . Diarrhea    chronic   . DVT (deep venous thrombosis) ([place])   . Dyspnea   . Fibromyalgia   . GERD (gastroesophageal reflux disease)   . Grave's disease   . Headache   . HOH (hard of hearing)   . Hypertension   . OP (osteoporosis)   . Parkinson's disease ([place])   . Peripheral vascular disease ([place])   . PUD (peptic ulcer disease)   . Recurrent upper respiratory infection (URI)   . Rhinitis   . Sleep apnea   . Spinal stenosis of lumbar region 04/23/2013  . Thrombophlebitis   . Tobacco use disorder 04/23/2013  . Vitamin  B 12 deficiency     Past Surgical History:  Procedure Laterality Date  . CAROTID ENDARTERECTOMY Left Sept. 20,2011   cea  . CHOLECYSTECTOMY  1999  . GASTRECTOMY     age 72   Part of small intestin and part of stomach  . LEFT HEART CATHETERIZATION WITH CORONARY ANGIOGRAM N/Young 10/02/2011   Procedure: LEFT HEART CATHETERIZATION WITH CORONARY ANGIOGRAM;  Surgeon: Lynn Margarita, MD;  Location: [facility];  Service: Cardiovascular;  Laterality: N/Young;     Home Medications:  Prior to Admission medications   Medication Sig Start Date End Date Taking? Authorizing  Provider  albuterol (PROVENTIL) (2.5 MG/3ML) 0.083% nebulizer solution Inhale 3 mLs into the lungs 2 (two) times daily. 11/09/19  Yes Young, Lynn A, MD  albuterol (VENTOLIN HFA) 108 (90 Base) MCG/ACT inhaler Inhale 2 puffs into the lungs every 6 (six) hours as needed for wheezing or shortness of breath. 11/09/19  Yes Young, Lynn A, MD  allopurinol (ZYLOPRIM) 300 MG tablet Take 1 tablet (300 mg total) by mouth daily. 11/09/19  Yes Young, Lynn A, MD  amLODipine (NORVASC) 10 MG tablet Take 1 tablet (10 mg total) by mouth daily. 11/09/19  Yes Young, Lynn A, MD  atorvastatin (LIPITOR) 20 MG tablet Take 1 tablet (20 mg total) by mouth at bedtime. 11/09/19  Yes Young, Lynn A, MD  DULoxetine (CYMBALTA) 20 MG capsule Take 20 mg by mouth daily. 12/21/19  Yes [provider]  metoprolol succinate (TOPROL-XL) 25 MG 24 hr tablet Take 25 mg by mouth daily. 12/21/19  Yes [provider]  Multiple Vitamin (MULTIVITAMIN) capsule Take 1 capsule by mouth daily.   Yes [provider]  pantoprazole (PROTONIX) 40 MG tablet Take 40 mg by mouth 2 (two) times daily. 10/28/19  Yes [provider]  potassium chloride SA (KLOR-CON) 20 MEQ tablet Take 20 mEq by mouth daily. 12/21/19  Yes [provider]  umeclidinium bromide (INCRUSE ELLIPTA) 62.5 MCG/INH AEPB Inhale 1 puff into the lungs daily. 01/31/20  Yes Young, Sophia, PA-C  furosemide (LASIX) 40 MG tablet Take 1 tablet (40 mg total) by mouth daily. Patient taking differently: Take 40 mg by mouth daily as needed for fluid.  11/24/19 02/01/20  Young, Lynn Picket, MD  metoprolol succinate (TOPROL-XL) 50 MG 24 hr tablet Take 1 tablet (50 mg total) by mouth daily. Take with or immediately following Young meal. Patient not taking: Reported on 02/01/2020 11/10/19   Young, Jerald Kief A, MD  predniSONE (DELTASONE) 50 MG tablet Take 1 tablet (50 mg total) by mouth daily. 06/03/56   Lynn Fuel, MD    Inpatient  Medications: Scheduled Meds: . allopurinol  300 mg Oral Daily  . amLODipine  10 mg Oral Daily  . DULoxetine  20 mg Oral Daily  . enoxaparin (LOVENOX) injection  30 mg Subcutaneous Q24H  . feeding supplement  1 Container Oral TID BM  . furosemide  40 mg Oral Daily  . insulin aspart  0-15 Units Subcutaneous TID WC  . ipratropium-albuterol  3 mL Nebulization TID  . metoprolol succinate  25 mg Oral Daily  . multivitamin with minerals  1 tablet Oral Daily  . nicotine  14 mg Transdermal Daily  . pantoprazole  40 mg Oral BID  . predniSONE  40 mg Oral Q breakfast   Continuous Infusions:  PRN Meds: acetaminophen **OR** acetaminophen, guaiFENesin-dextromethorphan, HYDROcodone-acetaminophen, prochlorperazine, sodium chloride  Allergies:   No Known Allergies  Social History:   Social History   Socioeconomic History  .  Marital status: Widowed    Spouse name: Not on file  . Number of children: 1  . Years of education: Not on file  . Highest education level: Not on file  Occupational History  . Occupation: retired  Tobacco Use  . Smoking status: Current Every Day Smoker    Packs/day: 2.00    Years: 50.00    Pack years: 100.00    Types: Cigarettes  . Smokeless tobacco: Never Used  . Tobacco comment: cessation info given and reviewed  Substance and Sexual Activity  . Alcohol use: No    Alcohol/week: 0.0 standard drinks  . Drug use: No  . Sexual activity: Not Currently    Birth control/protection: Post-menopausal  Other Topics Concern  . Not on file  Social History Narrative  . Not on file   Social Determinants of Health   Financial Resource Strain: High Risk  . Difficulty of Paying Living Expenses: Very hard  Food Insecurity: Food Insecurity Present  . Worried About Charity fundraiser in the Last Year: Sometimes true  . Ran Out of Food in the Last Year: Never true  Transportation Needs: No Transportation Needs  . Lack of Transportation (Medical): No  . Lack of  Transportation (Non-Medical): No  Physical Activity:   . Days of Exercise per Week:   . Minutes of Exercise per Session:   Stress: Stress Concern Present  . Feeling of Stress : To some extent  Social Connections: Moderately Isolated  . Frequency of Communication with Friends and Family: Three times Young week  . Frequency of Social Gatherings with Friends and Family: Once Young week  . Attends Religious Services: Never  . Active Member of Clubs or Organizations: No  . Attends Archivist Meetings: Never  . Marital Status: Widowed  Intimate Partner Violence:   . Fear of Current or Ex-Partner:   . Emotionally Abused:   [name] Physically Abused:   . Sexually Abused:     Family History:   Family History  Problem Relation Age of Onset  . Diabetes Mother   . Hyperlipidemia Mother   . Heart attack Mother   . Other Mother        varicose veins,respiratory,stroke  . Heart disease Mother        before age 30  . Hypertension Mother   . Varicose Veins Mother   . Diabetes Father   . Heart disease Father        before age 48  . Hyperlipidemia Father   . Heart attack Father   . Other Father        varicose veins  . Hypertension Father   . Varicose Veins Father   . Diabetes Sister   . Heart disease Sister        before age 93  . Hyperlipidemia Sister   . Heart attack Sister   . Other Sister        varicose veins  . Hypertension Sister   . Varicose Veins Sister   . Peripheral vascular disease Sister   . Diabetes Sister   . Hyperlipidemia Sister   . Hypertension Sister   . Varicose Veins Sister      ROS:  Please see the history of present illness.  All other ROS reviewed and negative.     Physical Exam/Data:   Vitals:   02/03/20 2032 02/03/20 2057 02/04/20 0548 02/04/20 0804  BP:  122/63 119/68   Pulse:  83 76   Resp:  18 20  Temp:  97.9 F (36.6 C) 97.8 F (36.6 C)   TempSrc:  Oral Oral   SpO2: 98% 97% 98% 96%  Weight:   48.8 kg   Height:        Intake/Output  Summary (Last 24 hours) at 02/04/2020 1102 Last data filed at 02/04/2020 9983 Gross per 24 hour  Intake 360 ml  Output 300 ml  Net 60 ml   Last 3 Weights 02/04/2020 02/03/2020 02/01/2020  Weight (lbs) 107 lb 9.4 oz 106 lb 8 oz 106 lb 14.8 oz  Weight (kg) 48.8 kg 48.308 kg 48.5 kg     Body mass index is 18.47 kg/m.  General:  Well nourished, well developed, in no acute distress HEENT: normal Lymph: no adenopathy Neck: no JVD Endocrine:  No thryomegaly Vascular: + carotid bruits right side; FA pulses 2+ bilaterally without bruits  Cardiac:  normal S1, S2; RRR; + murmur  Lungs:  clear to auscultation bilaterally, no wheezing, rhonchi or rales; 1L O2 Abd: soft, nontender, no hepatomegaly  Ext: no edema Musculoskeletal:  No deformities, BUE and BLE strength normal and equal Skin: warm and dry  Neuro:  CNs 2-12 intact, no focal abnormalities noted Psych:  Normal affect   EKG:  The EKG was personally reviewed and demonstrates:  NSR< 94 bpm, TWI lateral leads, no ST changes Telemetry:  Telemetry was personally reviewed and demonstrates:  NSR, HR 70-80, no other arrhythmias noted  Relevant CV Studies:  Echo results pending  Echo 11/04/19 1. Left ventricular ejection fraction, by visual estimation, is 60 to  65%. The left ventricle has normal function. There is moderately increased  left ventricular hypertrophy.  2. Left ventricular diastolic parameters are indeterminate.  3. Global right ventricle has normal systolic function.The right  ventricular size is normal.  4. Left atrial size was normal.  5. Right atrial size was normal.  6. The mitral valve is normal in structure. Trivial mitral valve  regurgitation.  7. The tricuspid valve is normal in structure.  8. The aortic valve is tricuspid. Aortic valve regurgitation is not  visualized. No evidence of aortic valve sclerosis or stenosis.  9. The pulmonic valve was not well visualized. Pulmonic valve  regurgitation is not  visualized.  10. The inferior vena cava is dilated in size with >50% respiratory  variability, suggesting right atrial pressure of 8 mmHg.  11. The tricuspid regurgitant velocity is 2.53 m/s, and with an assumed  right atrial pressure of 8 mmHg, the estimated right ventricular systolic  pressure is mildly elevated at 33.6 mmHg.   Laboratory Data:  High Sensitivity Troponin:   Recent Labs  Lab 01/31/20 0550 01/31/20 0737  TROPONINIHS 27* 23*     Chemistry Recent Labs  Lab 02/02/20 0546 02/03/20 0517 02/04/20 0518  NA 141 142 140  K 4.1 5.2* 3.7  CL 114* 113* 106  CO2 16* 19* 22  GLUCOSE 124* 132* 98  BUN 39* 54* 61*  CREATININE 1.54* 1.96* 1.93*  CALCIUM 8.4* 8.8* 8.1*  GFRNONAA 33* 25* 25*  GFRAA 38* 28* 29*  ANIONGAP 11 10 12     Recent Labs  Lab 01/31/20 0550 02/03/20 0517  PROT 6.5 6.4*  ALBUMIN 3.4* 3.5  AST 14* 28  ALT 10 25  ALKPHOS 136* 131*  BILITOT 0.6 0.5   Hematology Recent Labs  Lab 02/01/20 0725 02/02/20 0546 02/03/20 0517  WBC 12.2* 9.5 4.9  RBC 3.34* 3.10* 3.03*  HGB 8.9* 8.3* 8.2*  HCT 31.5* 28.3* 27.6*  MCV 94.3  91.3 91.1  MCH 26.6 26.8 27.1  MCHC 28.3* 29.3* 29.7*  RDW 17.8* 17.8* 17.7*  PLT 408* 311 329   BNP Recent Labs  Lab 01/31/20 0550 02/01/20 0725 02/02/20 0546  BNP 1,213.5* 1,560.5* 1,305.3*    DDimer No results for input(s): DDIMER in the last 168 hours.   Radiology/Studies:  DG Chest 2 View  Result Date: 02/01/2020 CLINICAL DATA:  Shortness of breath. EXAM: CHEST - 2 VIEW COMPARISON:  01/31/2020 FINDINGS: The cardiac silhouette, mediastinal and hilar contours are stable. Stable underlying chronic lung disease with emphysema and pulmonary scarring. Persistent left pleural effusion and overlying atelectasis and scarring. Stable vague rounded density in the left upper lobe. No definite acute pulmonary findings. IMPRESSION: Chronic lung disease with persistent left pleural effusion and overlying atelectasis and scarring.  No definite acute overlying pulmonary process. Electronically Signed   By: Marijo Sanes M.D.   On: 02/01/2020 05:37   CT Chest Wo Contrast  Result Date: 02/02/2020 CLINICAL DATA:  shortness of breath and abnormal chest x-ray EXAM: CT CHEST WITHOUT CONTRAST TECHNIQUE: Multidetector CT imaging of the chest was performed following the standard protocol without IV contrast. COMPARISON:  10/17/2017 FINDINGS: Cardiovascular: Borderline heart size. No pericardial effusion. There is aortic and coronary atherosclerosis. No acute vascular finding without contrast Mediastinum/Nodes: Increase in size of mediastinal lymph nodes since 2018 but strictly still within normal limits at 15 mm or less in short axis, largest located at the precarinal station. Surgical clips around the GE junction, likely hernia repair. Progressive venous collaterals in the chest wall and upper mediastinum. The left brachiocephalic vein is collapsed. Lungs/Pleura: Small bilateral pleural effusion which is dependent and layering. There is streaky density with volume loss at the lung bases. Scar-like appearance with equivocal cylindrical bronchiectasis at the medial segment right middle lobe. Mild generalized interlobular septal thickening at the lung bases. Subpleural nodule in the left upper lobe measuring 16 mm, new from 2018 when it was only Young tiny subpleural nodule. Upper Abdomen: No acute finding. Musculoskeletal: No acute finding IMPRESSION: 1. 16 mm nodule in the subpleural left upper lobe, worrisome for bronchogenic carcinoma. Recommend referral to multi disciplinary thoracic oncology. 2. Small pleural effusions and mild interstitial edema. Atelectasis is seen at both lung bases. 3. Progressive collaterals in the chest wall associated with left brachiocephalic vein occlusion/collapse. This may account for mild interval enlargement of mediastinal lymph nodes. 4.  Aortic Atherosclerosis (ICD10-I70.0).  Coronary atherosclerosis. Electronically  Signed   By: Monte Fantasia M.D.   On: 02/02/2020 07:36   DG Chest Port 1 View  Result Date: 02/02/2020 CLINICAL DATA:  Shortness of breath EXAM: PORTABLE CHEST 1 VIEW COMPARISON:  Yesterday FINDINGS: Cardiomegaly with hazy and interstitial opacity. Small left pleural effusion. No pneumothorax. Postoperative left neck. IMPRESSION: Worsening interstitial opacity favoring edema. Small left pleural effusion. Electronically Signed   By: Monte Fantasia M.D.   On: 02/02/2020 05:52   {  Assessment and Plan:   Acute on chronic Heart failure/Acute respiratory failure - presented with progressive SOB, BNP elevated to 1305 and CXR with worsening edema and small rt pleural effusion - started on IV lasix 40 mg daily. - Patient has lasix 40 mg to take as needed, but has not been taking it at home  - Echo this admission showed EF 50-55%, G1DD, mildly elevated pulmonary artery systolic pressure, mild to mo MR. - Patient put out 346mL overnight. -1.5L since admission - creatinine 1.54.>1.96>1.93. baseline around 1.6 - Lungs sound clear and euvolemic  on exam. She is very frail. Would hold on more IV diuresis. Patient might need to take Lasix every other day for maintenance. I think some of the SOB  from COPD.  - try to wean O2   CKD stage 4 - Scr 1.54 on admission - baseline around 1.6 - trend 1.96> 1.93.  -Wold hold on further Lasix  COPD/tobacco use - patient does not use O2 at baseline - continue with inhalers - recommend tobacco cessation  Mild to mod MR - per echo mildly worse form prior - will discuss with MD if need to pursue further work-up.   HTN - home meds Toprol-XL 25mg  daily and amlodipine 10 mg daily continued - IV lasix  HLD - continue atorvastatin 20 mg daily - LDL 63 11/05/19  DM2 - SSI per IM  Hyperkalemia - Lokelma - K+ today 3.7  Social Issues - patient needs help with daily activities at home. Does not have family in the area  For questions or updates, please  contact [facility] Please consult [email or web address] for contact info under     Signed, Jakhari Space Ninfa Meeker, PA-C  02/04/2020 11:02 AM

## 2020-02-04 NOTE — Progress Notes (Addendum)
NAME:  [name], MRN:  [id number], DOB:  12/22/1945, LOS: 1 ADMISSION DATE:  02/02/2020, CONSULTATION DATE:  02/04/20 REFERRING MD:  Dr. Tana Coast, CHIEF COMPLAINT:  Shortness of breath   Brief History   74 y/o F who was admitted on 4/6 with progressive shortness of breath.  CT imaging with enlarging left pulmonary nodule.   History of present illness   74 y/o F who presented to the ER on 4/6 with reports of progressive shortness of breath.   Of note, she was seen in the ER on 4/4, 4/5 and 4/6 for SOB. On 4/4 she reported she had run out of her home inhalers and was coughing more than usual.  CXR at that time showed unchanged LUL nodule with recommendation for non-emergent follow up.  She was treated with inhalers and discharged home.  She returned on 4/5 with ongoing SOB. CXR was unchanged.  She was treated with nebulized bronchodilators with improvement.  At discharge she was given an albuterol inhaler and 5 day course of prednisone.   The patient reports her primary care MD changed and she does not feel comfortable with her new one.  She indicates she ran out of all of her medications and has not been taking anything for at least two months.  She ran out of her inhalers approximately 2 weeks prior to admit (Combivent).  She states they "don't help anyway".  The patient reports she began smoking at age 6, smoked up to 3ppd, now smokes 1ppd.  States she has always been thin. Reports increasing SOB, cough that is largely non-productive. Denies fevers.   PCCM consulted for evaluation of nodule, dyspnea.   Past Medical History  COPD  OSA  Tobacco Abuse  Known Left Pulmonary Nodule - present in 07/2018 Asthma  DVT  Carotid Artery Occlusion  CAD / PVD HTN CHF - diastolic  Anemia  DM  CKD III  Parkinson's Disease  Anxiety  GERD  Graves Disease  Fibromyalgia  Chronic Pain Malnutrition   Significant Hospital Events   4/06 Admit   Consults:  PCCM   Procedures:    Significant  Diagnostic Tests:   CT Chest w/o 4/6 >> 16 mm nodule in the subpleural LUL, small pleural effusions and mild interstitial edema, atelectasis bilaterally, progressive collaterals in the chest wall associated with left brachiocephalic vein occlusion / collapse (may account for mild interval enlargement of mediastinal lymph nodes)  ECHO 4/8 >> LVEF 50-55%, no RWMA, grade I diastolic dysfunction, RV systolic normal, mild to moderate mitral valve regurgitation  Micro Data:  COVID 4/6 >> negative  MRSA PCR 4/6 >> negative   Antimicrobials:  Doxycycline 4/8 >>  Interim history/subjective:  Afebrile  Pt reports feeling better overall  Objective   Blood pressure 127/65, pulse 73, temperature 98.2 F (36.8 C), temperature source Oral, resp. rate 19, height 5\' 4"  (1.626 m), weight 48.8 kg, SpO2 98 %.        Intake/Output Summary (Last 24 hours) at 02/04/2020 1501 Last data filed at 02/04/2020 1100 Gross per 24 hour  Intake 480 ml  Output 600 ml  Net -120 ml   Filed Weights   02/03/20 0626 02/04/20 0548  Weight: 48.3 kg 48.8 kg    Examination: General: thin, chronically ill appearing adult female sitting up in bed in NAD HEENT: MM pink/moist, no upper teeth, anicteric, exophthalmus, glasses in place  Neuro: AAOx4, speech clear, MAE  CV: s1s2 RRR, no m/r/g PULM:  Prolonged exp phase, lungs bilaterally distant  without wheeze  GI: soft, bsx4 active  Extremities: warm/dry, no edema, chronic LE changes c/w venous insufficiency  Skin: no rashes or lesions  Resolved Hospital Problem list      Assessment & Plan:   LUL Pulmonary Nodule  Present on imaging in 07/2018, 47mm.  Now enlarged to 16 mm -options include planning for pulmonary / cardiac optimization with outpatient PET scan to assess nodule and paratracheal node, then pursue biopsy vs inpatient ENB early next week -lymph node and nodule will need to be biopsied to stage disease  -given compliance issues with other disease  processes as outpatient, I am concerned she will not follow up.  Will have to discuss with the patient.   COPD with Acute Exacerbation  Bronchiectasis  Reported COPD without baseline PFT's, mild airway thickening on CT but surprisingly little emphysema  -adjust pulmicort dosing  -continue brovana  -continue doxycycline -patient will need outpatient management of COPD  -she will need to be formally evaluated for COPD with PFT's -flutter valve while inpatient and will need to take home for use BID to ensure airway clearance  Left > Right Pleural Effusion Hx CHF   -lasix 20 mg IV x1 -follow up imaging  -may need thoracentesis   Tobacco Abuse  -nicotine patch  -smoking cessation counseling   Best practice:  Diet: per primary  Pain/Anxiety/Delirium protocol (if indicated): per primary  VAP protocol (if indicated): n/a  DVT prophylaxis: enoxaparin  GI prophylaxis: per primary  Glucose control: SSI  Mobility: As tolerated  Code Status: DNR  Family Communication: Patient updated on plan of care  Disposition: floor, per primary   Labs   CBC: Recent Labs  Lab 01/31/20 0550 02/01/20 0725 02/02/20 0546 02/03/20 0517  WBC 11.9* 12.2* 9.5 4.9  NEUTROABS 10.9* 8.2* 8.3*  --   HGB 8.5* 8.9* 8.3* 8.2*  HCT 28.2* 31.5* 28.3* 27.6*  MCV 91.3 94.3 91.3 91.1  PLT 293 408* 311 268    Basic Metabolic Panel: Recent Labs  Lab 01/31/20 0550 02/01/20 0725 02/02/20 0546 02/03/20 0517 02/04/20 0518  NA 142 143 141 142 140  K 4.1 4.3 4.1 5.2* 3.7  CL 116* 114* 114* 113* 106  CO2 17* 15* 16* 19* 22  GLUCOSE 120* 110* 124* 132* 98  BUN 34* 34* 39* 54* 61*  CREATININE 1.20* 1.46* 1.54* 1.96* 1.93*  CALCIUM 8.5* 8.5* 8.4* 8.8* 8.1*  MG  --   --  2.5*  --   --   PHOS  --   --  4.5  --   --    GFR: Estimated Creatinine Clearance: 19.7 mL/min (A) (by C-G formula based on SCr of 1.93 mg/dL (H)). Recent Labs  Lab 01/31/20 0550 02/01/20 0725 02/02/20 0546 02/03/20 0517  WBC 11.9*  12.2* 9.5 4.9    Liver Function Tests: Recent Labs  Lab 01/31/20 0550 02/03/20 0517  AST 14* 28  ALT 10 25  ALKPHOS 136* 131*  BILITOT 0.6 0.5  PROT 6.5 6.4*  ALBUMIN 3.4* 3.5   No results for input(s): LIPASE, AMYLASE in the last 168 hours. No results for input(s): AMMONIA in the last 168 hours.  ABG    Component Value Date/Time   PHART 7.274 (L) 02/02/2020 0535   PCO2ART 37.6 02/02/2020 0535   PO2ART 39.6 (LL) 02/02/2020 0535   HCO3 16.9 (L) 02/02/2020 0535   TCO2 16 (L) 12/21/2019 0440   ACIDBASEDEF 8.8 (H) 02/02/2020 0535   O2SAT 60.5 02/02/2020 0535     Coagulation  Profile: No results for input(s): INR, PROTIME in the last 168 hours.  Cardiac Enzymes: No results for input(s): CKTOTAL, CKMB, CKMBINDEX, TROPONINI in the last 168 hours.  HbA1C: Hgb A1c MFr Bld  Date/Time Value Ref Range Status  02/02/2020 09:58 AM 5.6 4.8 - 5.6 % Final    Comment:    (NOTE) Pre diabetes:          5.7%-6.4% Diabetes:              >6.4% Glycemic control for   <7.0% adults with diabetes   10/13/2017 08:19 AM 5.4 4.8 - 5.6 % Final    Comment:    (NOTE)         Prediabetes: 5.7 - 6.4         Diabetes: >6.4         Glycemic control for adults with diabetes: <7.0     CBG: Recent Labs  Lab 02/03/20 1141 02/03/20 1603 02/03/20 2055 02/04/20 0745 02/04/20 1225  GLUCAP 125* 146* 206* 95 190*    Review of Systems: Positives in [place]   Gen: Denies fever, chills, weight change, fatigue, night sweats HEENT: Denies blurred vision, double vision, hearing loss, tinnitus, sinus congestion, rhinorrhea, sore throat, neck stiffness, dysphagia PULM: Denies shortness of breath, largely non-productive cough, sputum production, hemoptysis, wheezing CV: Denies chest pain, edema, orthopnea, paroxysmal nocturnal dyspnea, palpitations GI: Denies abdominal pain, nausea, vomiting, diarrhea, hematochezia, melena, constipation, change in bowel habits GU: Denies dysuria, hematuria, polyuria,  oliguria, urethral discharge Endocrine: Denies hot or cold intolerance, polyuria, polyphagia or appetite change Derm: Denies rash, dry skin, scaling or peeling skin change Heme: Denies easy bruising, bleeding, bleeding gums Neuro: Denies headache, numbness, weakness, slurred speech, loss of memory or consciousness  Past Medical History  She,  has a past medical history of Anxiety and depression, Arthritis, Asthma, Carotid artery occlusion, Chronic kidney disease, Chronic pain, COPD (chronic obstructive pulmonary disease) ([place]), Coronary artery disease, DDD (degenerative disc disease), Diarrhea, DVT (deep venous thrombosis) ([place]), Dyspnea, Fibromyalgia, GERD (gastroesophageal reflux disease), Grave's disease, Headache, HOH (hard of hearing), Hypertension, OP (osteoporosis), Parkinson's disease ([place]), Peripheral vascular disease ([place]), PUD (peptic ulcer disease), Recurrent upper respiratory infection (URI), Rhinitis, Sleep apnea, Spinal stenosis of lumbar region (04/23/2013), Thrombophlebitis, Tobacco use disorder (04/23/2013), and Vitamin B 12 deficiency.   Surgical History    Past Surgical History:  Procedure Laterality Date  . CAROTID ENDARTERECTOMY Left Sept. 20,2011   cea  . CHOLECYSTECTOMY  1999  . GASTRECTOMY     age 67   Part of small intestin and part of stomach  . LEFT HEART CATHETERIZATION WITH CORONARY ANGIOGRAM N/A 10/02/2011   Procedure: LEFT HEART CATHETERIZATION WITH CORONARY ANGIOGRAM;  Surgeon: Sueanne Margarita, MD;  Location: [facility] CATH [facility];  Service: Cardiovascular;  Laterality: N/A;     Social History   reports that she has been smoking cigarettes. She has a 100.00 pack-year smoking history. She has never used smokeless tobacco. She reports that she does not drink alcohol or use drugs.   Family History   Her family history includes Diabetes in her father, mother, sister, and sister; Heart attack in her father, mother, and sister; Heart disease in her father, mother, and sister;  Hyperlipidemia in her father, mother, sister, and sister; Hypertension in her father, mother, sister, and sister; Other in her father, mother, and sister; Peripheral vascular disease in her sister; Varicose Veins in her father, mother, sister, and sister.   Allergies No Known Allergies   Home Medications  Prior to Admission medications   Medication Sig Start Date End Date Taking? Authorizing Provider  albuterol (PROVENTIL) (2.5 MG/3ML) 0.083% nebulizer solution Inhale 3 mLs into the lungs 2 (two) times daily. 11/09/19  Yes Regalado, Belkys A, MD  albuterol (VENTOLIN HFA) 108 (90 Base) MCG/ACT inhaler Inhale 2 puffs into the lungs every 6 (six) hours as needed for wheezing or shortness of breath. 11/09/19  Yes Regalado, Belkys A, MD  allopurinol (ZYLOPRIM) 300 MG tablet Take 1 tablet (300 mg total) by mouth daily. 11/09/19  Yes Regalado, Belkys A, MD  amLODipine (NORVASC) 10 MG tablet Take 1 tablet (10 mg total) by mouth daily. 11/09/19  Yes Regalado, Belkys A, MD  atorvastatin (LIPITOR) 20 MG tablet Take 1 tablet (20 mg total) by mouth at bedtime. 11/09/19  Yes Regalado, Belkys A, MD  DULoxetine (CYMBALTA) 20 MG capsule Take 20 mg by mouth daily. 12/21/19  Yes [provider]  metoprolol succinate (TOPROL-XL) 25 MG 24 hr tablet Take 25 mg by mouth daily. 12/21/19  Yes [provider]  Multiple Vitamin (MULTIVITAMIN) capsule Take 1 capsule by mouth daily.   Yes [provider]  pantoprazole (PROTONIX) 40 MG tablet Take 40 mg by mouth 2 (two) times daily. 10/28/19  Yes [provider]  potassium chloride SA (KLOR-CON) 20 MEQ tablet Take 20 mEq by mouth daily. 12/21/19  Yes [provider]  umeclidinium bromide (INCRUSE ELLIPTA) 62.5 MCG/INH AEPB Inhale 1 puff into the lungs daily. 01/31/20  Yes Caccavale, Sophia, PA-C  furosemide (LASIX) 40 MG tablet Take 1 tablet (40 mg total) by mouth daily. Patient taking differently: Take 40 mg by mouth daily as needed for  fluid.  11/24/19 02/01/20  Arrien, Jimmy Picket, MD  metoprolol succinate (TOPROL-XL) 50 MG 24 hr tablet Take 1 tablet (50 mg total) by mouth daily. Take with or immediately following a meal. Patient not taking: Reported on 02/01/2020 11/10/19   Regalado, Jerald Kief A, MD  predniSONE (DELTASONE) 50 MG tablet Take 1 tablet (50 mg total) by mouth daily. 12/05/05   Delora Fuel, MD     Critical care time: n/a    Noe Gens, MSN, NP-C [facility] 02/04/2020, 3:01 PM   Please see [email or web address] for pager details.

## 2020-02-05 DIAGNOSIS — I5033 Acute on chronic diastolic (congestive) heart failure: Secondary | ICD-10-CM | POA: Diagnosis not present

## 2020-02-05 DIAGNOSIS — I2510 Atherosclerotic heart disease of native coronary artery without angina pectoris: Secondary | ICD-10-CM | POA: Diagnosis not present

## 2020-02-05 DIAGNOSIS — J441 Chronic obstructive pulmonary disease with (acute) exacerbation: Secondary | ICD-10-CM | POA: Diagnosis not present

## 2020-02-05 DIAGNOSIS — J9601 Acute respiratory failure with hypoxia: Secondary | ICD-10-CM | POA: Diagnosis not present

## 2020-02-05 LAB — BASIC METABOLIC PANEL
Anion gap: 18 — ABNORMAL HIGH (ref 5–15)
BUN: 75 mg/dL — ABNORMAL HIGH (ref 8–23)
CO2: 19 mmol/L — ABNORMAL LOW (ref 22–32)
Calcium: 8.1 mg/dL — ABNORMAL LOW (ref 8.9–10.3)
Chloride: 103 mmol/L (ref 98–111)
Creatinine, Ser: 2.31 mg/dL — ABNORMAL HIGH (ref 0.44–1.00)
GFR calc Af Amer: 23 mL/min — ABNORMAL LOW (ref >60)
GFR calc non Af Amer: 20 mL/min — ABNORMAL LOW (ref >60)
Glucose, Bld: 125 mg/dL — ABNORMAL HIGH (ref 70–99)
Potassium: 3.8 mmol/L (ref 3.5–5.1)
Sodium: 140 mmol/L (ref 135–145)

## 2020-02-05 LAB — GLUCOSE, CAPILLARY
Glucose-Capillary: 140 mg/dL — ABNORMAL HIGH (ref 70–99)
Glucose-Capillary: 149 mg/dL — ABNORMAL HIGH (ref 70–99)
Glucose-Capillary: 156 mg/dL — ABNORMAL HIGH (ref 70–99)
Glucose-Capillary: 175 mg/dL — ABNORMAL HIGH (ref 70–99)

## 2020-02-05 NOTE — TOC Initial Note (Signed)
Transition of Care [facility]) - Initial/Assessment Note    Patient Details  Name: [name] MRN: [id number] Date of Birth: 15-May-1946  [facility]) CM/SW Contact:    Dessa Phi, RN Phone Number: 02/05/2020, 3:16 PM  Clinical Narrative: PT recc SNF-spoke to patient/permission to talk to her dtr [name]-both in agreement to fax out to SNF-await bed offers.                  Expected Discharge Plan: Skilled Nursing Facility Barriers to Discharge: Continued Medical Work up   Patient Goals and CMS Choice Patient states their goals for this hospitalization and ongoing recovery are:: go to rehab CMS Medicare.gov Compare Post Acute Care list provided to:: Patient Represenative (must comment)    Expected Discharge Plan and Services Expected Discharge Plan: [place]   Discharge Planning Services: CM Consult   Living arrangements for the past 2 months: Single Family Home                                      Prior Living Arrangements/Services Living arrangements for the past 2 months: Single Family Home Lives with:: Self Patient language and need for interpreter reviewed:: Yes Do you feel safe going back to the place where you live?: Yes      Need for Family Participation in Patient Care: No (Comment) Care giver support system in place?: Yes (comment)   Criminal Activity/Legal Involvement Pertinent to Current Situation/Hospitalization: No - Comment as needed  Activities of Daily Living Home Assistive Devices/Equipment: Walker (specify type), Cane (specify quad or straight) ADL Screening (condition at time of admission) Patient's cognitive ability adequate to safely complete daily activities?: Yes Is the patient deaf or have difficulty hearing?: No Does the patient have difficulty seeing, even when wearing glasses/contacts?: No Does the patient have difficulty concentrating, remembering, or making decisions?: No Patient able to express need for  assistance with ADLs?: No Does the patient have difficulty dressing or bathing?: No Independently performs ADLs?: Yes (appropriate for developmental age) Does the patient have difficulty walking or climbing stairs?: No Weakness of Legs: None Weakness of Arms/Hands: None  Permission Sought/Granted Permission sought to share information with : Case Manager Permission granted to share information with : Yes, Verbal Permission Granted  Share Information with NAME: Case Manager  Permission granted to share info w AGENCY: SNF  Permission granted to share info w Relationship: [name] [phone]     Emotional Assessment Appearance:: Appears stated age Attitude/Demeanor/Rapport: Gracious Affect (typically observed): Accepting Orientation: : Oriented to Self, Oriented to Place, Oriented to  Time, Oriented to Situation Alcohol / Substance Use: Tobacco Use Psych Involvement: No (comment)  Admission diagnosis:  SOB (shortness of breath) [R06.02] COPD exacerbation (HCC) [J44.1] Acute respiratory failure with hypoxemia (HCC) [J96.01] Patient Active Problem List   Diagnosis Date Noted  . SOB (shortness of breath)   . Coronary artery calcification seen on CAT scan   . Acute respiratory failure with hypoxemia ([place]) 02/02/2020  . Tremor 02/02/2020  . Vitamin B 12 deficiency   . Malnutrition of moderate degree 12/24/2019  . Acute on chronic diastolic CHF (congestive heart failure) ([place]) 12/21/2019  . CKD (chronic kidney disease) stage 3, GFR 30-59 ml/min 12/21/2019  . Hypertensive urgency 11/20/2019  . Flash pulmonary edema ([place])   . Positive D dimer   . Acute respiratory failure with hypercapnia ([place]) 11/04/2019  .  Acute exacerbation of CHF (congestive heart failure) ([place]) 11/04/2019  . DNR (do not resuscitate) 11/04/2019  . Chronic pain syndrome 11/04/2019  . Pulmonary nodule, left 08/02/2018  . Major depressive disorder, single episode, moderate ([place]) 08/02/2018  . Benzodiazepine  overdose of undetermined intent 08/01/2018  . Cellulitis 05/14/2018  . Anemia 05/14/2018  . At risk for adverse drug event 10/17/2017  . Salicylate intoxication, undetermined intent, subsequent encounter 10/10/2017  . Acute renal failure with acute renal cortical necrosis superimposed on stage 3a chronic kidney disease ([place]) 10/10/2017  . Headache 10/10/2017  . Severe protein-calorie malnutrition ([place]) 01/26/2016  . Opiate overdose ([place]) 01/25/2016  . Suicide attempt ([place]) 01/25/2016  . Depression   . Dyslipidemia   . Gastroesophageal reflux disease without esophagitis   . COPD (chronic obstructive pulmonary disease) ([place]) 07/29/2015  . Diabetes mellitus with neurological manifestation ([place]) 07/29/2015  . Frequent falls 07/29/2015  . HTN (hypertension) 07/29/2015  . AKI (acute kidney injury) ([place]) 07/29/2015  . SIRS (systemic inflammatory response syndrome) ([place]) 07/29/2015  . Spinal stenosis of lumbar region 04/23/2013  . Tremor due to multiple drugs 04/23/2013  . Tobacco use disorder 04/23/2013  . COPD exacerbation ([place]) 04/23/2013  . Occlusion and stenosis of carotid artery without mention of cerebral infarction 03/12/2012  . Viral bronchitis-possible h. infl vs Norovirus 11/21/2011  . Pleuritic chest pain 09/18/2011   PCP:  [name], MD Pharmacy:   [place], [place] - [address] [place] [address] Phone: [phone] Fax: [phone]  [place], [place] [address] [place] [address] Phone: [phone] Fax: [phone]     Social Determinants of Health ([place]) Interventions    Readmission Risk Interventions Readmission Risk Prevention Plan 02/05/2020 12/24/2019 11/23/2019  Transportation Screening Complete Complete Complete  PCP or Specialist Appt within 3-5 Days - Complete Complete  HRI or Home Care Consult - Complete Complete  Social Work  Consult for [place] Planning/Counseling - Complete Complete  Palliative Care Screening - Not Applicable Not Applicable  Medication Review Press photographer) Complete Complete Complete  PCP or Specialist appointment within 3-5 days of discharge Complete - -  [place] or Home Care Consult Complete - -  SW Recovery Care/Counseling Consult Complete - -  Palliative Care Screening Not Applicable - -  Skilled Nursing Facility Complete - -  Some recent data might be hidden

## 2020-02-05 NOTE — TOC Progression Note (Signed)
Transition of Care [facility]) - Progression Note    Patient Details  Name: [name] MRN: [id number] Date of Birth: 04-15-1946  [facility] CM/SW Contact  Tanique Matney, Juliann Pulse, RN Phone Number: 02/05/2020, 3:24 PM  Clinical Narrative: Once patient has chosen the SNF-the SNF will get auth which may take 1-2 days.covid should be ordered once the SNF has started [place].      Expected Discharge Plan: Skilled Nursing Facility Barriers to Discharge: Continued Medical Work up  Expected Discharge Plan and Services Expected Discharge Plan: [place]   Discharge Planning Services: CM Consult   Living arrangements for the past 2 months: Single Family Home                                       Social Determinants of Health (SDOH) Interventions    Readmission Risk Interventions Readmission Risk Prevention Plan 02/05/2020 12/24/2019 11/23/2019  Transportation Screening Complete Complete Complete  PCP or Specialist Appt within 3-5 Days - Complete Complete  HRI or Home Care Consult - Complete Complete  Social Work Consult for [place] Planning/Counseling - Complete Complete  Palliative Care Screening - Not Applicable Not Applicable  Medication Review Press photographer) Complete Complete Complete  PCP or Specialist appointment within 3-5 days of discharge Complete - -  [place] or Home Care Consult Complete - -  SW Recovery Care/Counseling Consult Complete - -  Palliative Care Screening Not Applicable - -  Skilled Nursing Facility Complete - -  Some recent data might be hidden

## 2020-02-05 NOTE — Progress Notes (Signed)
[facility]                                                                              Patient Demographics  [name], is a 74 y.o. female, DOB - Mar 16, 1946, UYQ:[id number]  Admit date - 02/02/2020   Admitting Physician Reubin Milan, MD  Outpatient Primary MD for the patient is [name], MD  Outpatient specialists:   LOS - 2  days   Medical records reviewed and are as summarized below:    Chief Complaint  Patient presents with  . Shortness of Breath       Brief summary   Patient is a 74 year old female with anxiety, depression, asthma, COPD, active smoker, carotid artery occlusion, CKD chronic pain of lower extremities, fibromyalgia, spinal stenosis of lumbar region, DJD, history of DVT, CAD, chronic diarrhea, GERD, Graves' disease, chronic headaches, hypertension, Parkinson's disease, PVD diabetes mellitus presented due to progressively worsening dyspnea for the past few days.  Patient reported using her bronchodilators without significant results at home.  Patient received DuoNeb, Solu-Medrol via EMS and was placed on nonrebreather transiently. BNP 1305, chest x-ray showed worsening interstitial opacities favoring edema, small left pleural effusion  Assessment & Plan    Principal Problem:   Acute respiratory failure with hypoxemia ([place]), likely secondary to acute on chronic diastolic CHF -Patient placed on IV Lasix for diuresis, negative balance of 1.7 L however creatinine has trended up.  Hold off on Lasix --Hold off on Lasix for now, creatinine trended up to 2.3 -Cardiology was consulted, seen by Dr. Radford Pax, felt severe COPD also contributing -2D echo showed EF of 50 to 63%, grade 1 diastolic dysfunction, mildly elevated PA pressure   Active Problems:  Severe COPD exacerbation -Patient required NRB transiently and IV steroids, nebs by EMS -Wheezing better today, continue IV steroids, Pulmicort, Brovana, flutter valve -Continue  DuoNebs, Tessalon Perles, Robitussin, doxycycline -Appreciate pulmonology recommendations    Pulmonary nodule, incidentally seen on CT chest -CT chest showed 16 mm nodule in the subpleural left upper lobe worrisome for bronchogenic carcinoma -Appreciate pulmonology recommendations, recommended navigational biopsy LUL nodule with EBUS, timing per pulmonology   Hyperkalemia -Received Lokelma on 4/7 -Potassium currently normal 3.8    Diabetes mellitus, type II with hyperglycemia uncontrolled -CBGs likely uncontrolled due to steroids.  Hemoglobin A1c 5.6 -Continue sliding scale insulin     HTN (hypertension) -BP currently stable Continue amlodipine, metoprolol    Depression -Continue Cymbalta    Tremor  -Patient has history of Parkinson's disease, recommended outpatient follow-up with neurology  Dyslipidemia Continue atorvastatin 20 mg p.o. daily.    Gastroesophageal reflux disease without esophagitis Protonix 40 mg p.o. daily.    Vitamin B 12 deficiency Has not had a injection in several months. Received cyanocobalamin 1000 mcg IM x1   Protein calorie malnutrition Estimated body mass index is 17.86 kg/m as calculated from the following:   Height as of this encounter: 5\' 4"  (1.626 m).   Weight as of this encounter: 47.2 kg.  Code Status: DNR DVT Prophylaxis: Lovenox Family Communication: Discussed all imaging results, lab results, explained to the patient    Disposition Plan: Patient  from home, PT recommended skilled nursing facility  Time Spent in minutes 25 minutes  Procedures:  None  Consultants:   Cardiology Pulmonology  Antimicrobials:   Anti-infectives (From admission, onward)   Start     Dose/Rate Route Frequency Ordered Stop   02/04/20 1545  doxycycline (VIBRA-TABS) tablet 100 mg     100 mg Oral Every 12 hours 02/04/20 1452           Medications  Scheduled Meds: . allopurinol  100 mg Oral Daily  . amLODipine  10 mg Oral Daily  .  arformoterol  15 mcg Nebulization BID  . benzonatate  100 mg Oral TID  . budesonide (PULMICORT) nebulizer solution  0.5 mg Nebulization BID  . doxycycline  100 mg Oral Q12H  . DULoxetine  20 mg Oral Daily  . enoxaparin (LOVENOX) injection  30 mg Subcutaneous Q24H  . feeding supplement  1 Container Oral TID BM  . insulin aspart  0-15 Units Subcutaneous TID WC  . ipratropium-albuterol  3 mL Nebulization TID  . methylPREDNISolone (SOLU-MEDROL) injection  60 mg Intravenous Q12H  . metoprolol succinate  25 mg Oral Daily  . multivitamin with minerals  1 tablet Oral Daily  . nicotine  14 mg Transdermal Daily  . pantoprazole  40 mg Oral BID   Continuous Infusions: PRN Meds:.acetaminophen **OR** acetaminophen, guaiFENesin-dextromethorphan, HYDROcodone-acetaminophen, prochlorperazine, sodium chloride      Subjective:   [name] was seen and examined today.  Still somewhat short of breath however wheezing is somewhat better today.  No chest pain no fevers or chills.  Still coughing  Patient denies dizziness, abdominal pain, N/V/D/C, new weakness, numbess, tingling  Objective:   Vitals:   02/05/20 0902 02/05/20 0912 02/05/20 1335 02/05/20 1424  BP:  131/68 135/62   Pulse:  72 72   Resp:  18 16   Temp:  98 F (36.7 C) 98.5 F (36.9 C)   TempSrc:  Axillary Oral   SpO2: 97% 100% 98% 99%  Weight:      Height:        Intake/Output Summary (Last 24 hours) at 02/05/2020 1556 Last data filed at 02/05/2020 1300 Gross per 24 hour  Intake 590 ml  Output 700 ml  Net -110 ml     Wt Readings from Last 3 Encounters:  02/05/20 47.2 kg  02/01/20 48.5 kg  01/31/20 48.5 kg    Physical Exam  General: Alert and oriented x 3, NAD  Cardiovascular: S1 S2 clear, RRR. No pedal edema b/l  Respiratory: Decreased breath sound at the bases with scattered wheezing  Gastrointestinal: Soft, nontender, nondistended, NBS  Ext: no pedal edema bilaterally  Neuro: Chronic right hand tremor,  otherwise no acute deficits  Musculoskeletal: No cyanosis, clubbing  Skin: No rashes  Psych: Normal affect and demeanor, alert and oriented x3      Data Reviewed:  I have personally reviewed following labs and imaging studies  Micro Results Recent Results (from the past 240 hour(s))  SARS CORONAVIRUS 2 (TAT 6-24 HRS) Nasopharyngeal Nasopharyngeal Swab     Status: None   Collection Time: 02/02/20  7:54 AM   Specimen: Nasopharyngeal Swab  Result Value Ref Range Status   SARS Coronavirus 2 NEGATIVE NEGATIVE Final    Comment: (NOTE) SARS-CoV-2 target nucleic acids are NOT DETECTED. The SARS-CoV-2 RNA is generally detectable in upper and lower respiratory specimens during the acute phase of infection. Negative results do not preclude SARS-CoV-2 infection, do not rule out co-infections with other pathogens, and  should not be used as the sole basis for treatment or other patient management decisions. Negative results must be combined with clinical observations, patient history, and epidemiological information. The expected result is Negative. Fact Sheet for Patients: SugarRoll.be Fact Sheet for Healthcare Providers: https://www.woods-mathews.com/ This test is not yet approved or cleared by the [place] FDA and  has been authorized for detection and/or diagnosis of SARS-CoV-2 by FDA under an Emergency Use Authorization (EUA). This EUA will remain  in effect (meaning this test can be used) for the duration of the COVID-19 declaration under Section 56 4(b)(1) of the Act, 21 U.S.C. section 360bbb-3(b)(1), unless the authorization is terminated or revoked sooner. Performed at [facility], [place] [address], [place], [place] [address]   MRSA PCR Screening     Status: None   Collection Time: 02/02/20  6:43 PM   Specimen: Nasal Mucosa; Nasopharyngeal  Result Value Ref Range Status   MRSA by PCR NEGATIVE NEGATIVE Final    Comment:         The GeneXpert MRSA Assay (FDA approved for NASAL specimens only), is one component of a comprehensive MRSA colonization surveillance program. It is not intended to diagnose MRSA infection nor to guide or monitor treatment for MRSA infections. Performed at [facility], [place] [address] [place],  [address]     Radiology Reports DG Chest 2 View  Result Date: 02/01/2020 CLINICAL DATA:  Shortness of breath. EXAM: CHEST - 2 VIEW COMPARISON:  01/31/2020 FINDINGS: The cardiac silhouette, mediastinal and hilar contours are stable. Stable underlying chronic lung disease with emphysema and pulmonary scarring. Persistent left pleural effusion and overlying atelectasis and scarring. Stable vague rounded density in the left upper lobe. No definite acute pulmonary findings. IMPRESSION: Chronic lung disease with persistent left pleural effusion and overlying atelectasis and scarring. No definite acute overlying pulmonary process. Electronically Signed   By: Marijo Sanes M.D.   On: 02/01/2020 05:37   DG Chest 2 View  Result Date: 01/31/2020 CLINICAL DATA:  COPD exacerbation. EXAM: CHEST - 2 VIEW COMPARISON:  12/22/2019 FINDINGS: Left lung base opacity, similar to the prior exam, consistent with a small pleural effusion and associated lung base atelectasis or scarring. Lungs are hyperexpanded. There are chronic bronchitic changes at both lung bases, as well as bilateral interstitial thickening, findings are stable from the prior study. There is a nodular opacity that projects over the medial left upper lobe, which may be due to a combination of superimposed osseous structures, but raises possibility of a true lung nodule. This was not evident previously. No pneumothorax. Cardiac silhouette is normal in size. No mediastinal or hilar masses or evidence of adenopathy. Skeletal structures are intact. IMPRESSION: 1. No convincing acute cardiopulmonary disease. 2. There are significant  chronic findings including left lung base opacity that is consistent with a combination of pleural fluid and atelectasis or scarring, bilateral basilar bronchitic changes bilateral interstitial thickening. Lungs are hyperexpanded consistent with COPD. 3. Possible nodule in the medial left upper lobe. Recommend follow-up unenhanced chest CT on a nonemergent basis for further assessment. Electronically Signed   By: Lajean Manes M.D.   On: 01/31/2020 06:00   CT Chest Wo Contrast  Result Date: 02/02/2020 CLINICAL DATA:  shortness of breath and abnormal chest x-ray EXAM: CT CHEST WITHOUT CONTRAST TECHNIQUE: Multidetector CT imaging of the chest was performed following the standard protocol without IV contrast. COMPARISON:  10/17/2017 FINDINGS: Cardiovascular: Borderline heart size. No pericardial effusion. There is aortic and coronary  atherosclerosis. No acute vascular finding without contrast Mediastinum/Nodes: Increase in size of mediastinal lymph nodes since 2018 but strictly still within normal limits at 15 mm or less in short axis, largest located at the precarinal station. Surgical clips around the GE junction, likely hernia repair. Progressive venous collaterals in the chest wall and upper mediastinum. The left brachiocephalic vein is collapsed. Lungs/Pleura: Small bilateral pleural effusion which is dependent and layering. There is streaky density with volume loss at the lung bases. Scar-like appearance with equivocal cylindrical bronchiectasis at the medial segment right middle lobe. Mild generalized interlobular septal thickening at the lung bases. Subpleural nodule in the left upper lobe measuring 16 mm, new from 2018 when it was only a tiny subpleural nodule. Upper Abdomen: No acute finding. Musculoskeletal: No acute finding IMPRESSION: 1. 16 mm nodule in the subpleural left upper lobe, worrisome for bronchogenic carcinoma. Recommend referral to multi disciplinary thoracic oncology. 2. Small pleural  effusions and mild interstitial edema. Atelectasis is seen at both lung bases. 3. Progressive collaterals in the chest wall associated with left brachiocephalic vein occlusion/collapse. This may account for mild interval enlargement of mediastinal lymph nodes. 4.  Aortic Atherosclerosis (ICD10-I70.0).  Coronary atherosclerosis. Electronically Signed   By: Monte Fantasia M.D.   On: 02/02/2020 07:36   DG Chest Port 1 View  Result Date: 02/02/2020 CLINICAL DATA:  Shortness of breath EXAM: PORTABLE CHEST 1 VIEW COMPARISON:  Yesterday FINDINGS: Cardiomegaly with hazy and interstitial opacity. Small left pleural effusion. No pneumothorax. Postoperative left neck. IMPRESSION: Worsening interstitial opacity favoring edema. Small left pleural effusion. Electronically Signed   By: Monte Fantasia M.D.   On: 02/02/2020 05:52   ECHOCARDIOGRAM COMPLETE  Result Date: 02/04/2020    ECHOCARDIOGRAM REPORT   Patient Name:   ANEL CREIGHTON Date of Exam: 02/04/2020 Medical Rec #:  [id number]       Height:       64.0 in Accession #:    [id number]      Weight:       107.6 lb Date of Birth:  1946/06/15        BSA:          1.503 m Patient Age:    15 years        BP:           119/68 mmHg Patient Gender: F               HR:           76 bpm. Exam Location:  Inpatient Procedure: 2D Echo Indications:    Congestive Heart Failure 428.0 / I50.9  History:        Patient has prior history of Echocardiogram examinations, most                 recent 11/04/2019. COPD; Risk Factors:Tobacco use, Hypertension,                 Diabetes and Dyslipidemia. AKI                 CKD.  Sonographer:    Vikki Ports Turrentine Referring Phys: [id number] Charlotte Fidalgo K Marvis Bakken IMPRESSIONS  1. Left ventricular ejection fraction, by estimation, is 50 to 55%. The left ventricle has low normal function. The left ventricle has no regional wall motion abnormalities. Left ventricular diastolic parameters are consistent with Grade I diastolic dysfunction (impaired relaxation).  2. Right  ventricular systolic function is normal. The right ventricular size is normal. There is mildly elevated pulmonary  artery systolic pressure. The estimated right ventricular systolic pressure is 11.9 mmHg.  3. The mitral valve is myxomatous. Mild to moderate mitral valve regurgitation. No evidence of mitral stenosis.  4. The aortic valve is normal in structure. Aortic valve regurgitation is not visualized. No aortic stenosis is present.  5. The inferior vena cava is normal in size with greater than 50% respiratory variability, suggesting right atrial pressure of 3 mmHg. Comparison(s): Prior images reviewed side by side. Mitral regurgitation has increased when compared to prior study. FINDINGS  Left Ventricle: Left ventricular ejection fraction, by estimation, is 50 to 55%. The left ventricle has low normal function. The left ventricle has no regional wall motion abnormalities. The left ventricular internal cavity size was normal in size. There is no left ventricular hypertrophy. Left ventricular diastolic parameters are consistent with Grade I diastolic dysfunction (impaired relaxation). Right Ventricle: The right ventricular size is normal. No increase in right ventricular wall thickness. Right ventricular systolic function is normal. There is mildly elevated pulmonary artery systolic pressure. The tricuspid regurgitant velocity is 2.74  m/s, and with an assumed right atrial pressure of 3 mmHg, the estimated right ventricular systolic pressure is 41.7 mmHg. Left Atrium: Left atrial size was normal in size. Right Atrium: Right atrial size was normal in size. Pericardium: There is no evidence of pericardial effusion. Mitral Valve: The mitral valve is myxomatous. There is moderate thickening of the mitral valve leaflet(s). Normal mobility of the mitral valve leaflets. Mild to moderate mitral valve regurgitation. No evidence of mitral valve stenosis. Tricuspid Valve: The tricuspid valve is normal in structure. Tricuspid  valve regurgitation is mild . No evidence of tricuspid stenosis. Aortic Valve: The aortic valve is normal in structure. Aortic valve regurgitation is not visualized. No aortic stenosis is present. Pulmonic Valve: The pulmonic valve was normal in structure. Pulmonic valve regurgitation is not visualized. No evidence of pulmonic stenosis. Aorta: The aortic root is normal in size and structure. Venous: The inferior vena cava is normal in size with greater than 50% respiratory variability, suggesting right atrial pressure of 3 mmHg. IAS/Shunts: No atrial level shunt detected by color flow Doppler.  LEFT VENTRICLE PLAX 2D LVIDd:         4.50 cm  Diastology LVIDs:         3.30 cm  LV e' lateral:   6.09 cm/s LV PW:         0.90 cm  LV E/e' lateral: 17.4 LV IVS:        0.90 cm  LV e' medial:    6.09 cm/s LVOT diam:     1.90 cm  LV E/e' medial:  17.4 LV SV:         50 LV SV Index:   34 LVOT Area:     2.84 cm  RIGHT VENTRICLE RV S prime:     12.80 cm/s TAPSE (M-mode): 2.0 cm LEFT ATRIUM             Index       RIGHT ATRIUM           Index LA diam:        3.10 cm 2.06 cm/m  RA Area:     12.20 cm LA Vol (A2C):   40.8 ml 27.14 ml/m RA Volume:   27.50 ml  18.29 ml/m LA Vol (A4C):   39.7 ml 26.41 ml/m LA Biplane Vol: 41.0 ml 27.28 ml/m  AORTIC VALVE LVOT Vmax:   86.80 cm/s LVOT Vmean:  67.800  cm/s LVOT VTI:    0.178 m  AORTA Ao Root diam: 2.60 cm MITRAL VALVE                TRICUSPID VALVE MV Area (PHT): 3.83 cm     TR Peak grad:   30.0 mmHg MV Decel Time: 198 msec     TR Vmax:        274.00 cm/s MV E velocity: 106.00 cm/s MV A velocity: 117.00 cm/s  SHUNTS MV E/A ratio:  0.91         Systemic VTI:  0.18 m                             Systemic Diam: 1.90 cm Candee Furbish MD Electronically signed by Candee Furbish MD Signature Date/Time: 02/04/2020/11:53:29 AM    Final     Lab Data:  CBC: Recent Labs  Lab 01/31/20 0550 02/01/20 0725 02/02/20 0546 02/03/20 0517  WBC 11.9* 12.2* 9.5 4.9  NEUTROABS 10.9* 8.2* 8.3*  --    HGB 8.5* 8.9* 8.3* 8.2*  HCT 28.2* 31.5* 28.3* 27.6*  MCV 91.3 94.3 91.3 91.1  PLT 293 408* 311 063   Basic Metabolic Panel: Recent Labs  Lab 02/01/20 0725 02/02/20 0546 02/03/20 0517 02/04/20 0518 02/05/20 0511  NA 143 141 142 140 140  K 4.3 4.1 5.2* 3.7 3.8  CL 114* 114* 113* 106 103  CO2 15* 16* 19* 22 19*  GLUCOSE 110* 124* 132* 98 125*  BUN 34* 39* 54* 61* 75*  CREATININE 1.46* 1.54* 1.96* 1.93* 2.31*  CALCIUM 8.5* 8.4* 8.8* 8.1* 8.1*  MG  --  2.5*  --   --   --   PHOS  --  4.5  --   --   --    GFR: Estimated Creatinine Clearance: 15.9 mL/min (A) (by C-G formula based on SCr of 2.31 mg/dL (H)). Liver Function Tests: Recent Labs  Lab 01/31/20 0550 02/03/20 0517  AST 14* 28  ALT 10 25  ALKPHOS 136* 131*  BILITOT 0.6 0.5  PROT 6.5 6.4*  ALBUMIN 3.4* 3.5   No results for input(s): LIPASE, AMYLASE in the last 168 hours. No results for input(s): AMMONIA in the last 168 hours. Coagulation Profile: No results for input(s): INR, PROTIME in the last 168 hours. Cardiac Enzymes: No results for input(s): CKTOTAL, CKMB, CKMBINDEX, TROPONINI in the last 168 hours. BNP (last 3 results) No results for input(s): PROBNP in the last 8760 hours. HbA1C: No results for input(s): HGBA1C in the last 72 hours. CBG: Recent Labs  Lab 02/04/20 1225 02/04/20 1625 02/04/20 2147 02/05/20 0757 02/05/20 1156  GLUCAP 190* 137* 233* 140* 149*   Lipid Profile: No results for input(s): CHOL, HDL, LDLCALC, TRIG, CHOLHDL, LDLDIRECT in the last 72 hours. Thyroid Function Tests: No results for input(s): TSH, T4TOTAL, FREET4, T3FREE, THYROIDAB in the last 72 hours. Anemia Panel: No results for input(s): VITAMINB12, FOLATE, FERRITIN, TIBC, IRON, RETICCTPCT in the last 72 hours. Urine analysis:    Component Value Date/Time   COLORURINE STRAW (A) 11/04/2019 0911   APPEARANCEUR CLEAR 11/04/2019 0911   LABSPEC 1.010 11/04/2019 0911   PHURINE 5.0 11/04/2019 0911   GLUCOSEU NEGATIVE  11/04/2019 0911   HGBUR NEGATIVE 11/04/2019 0911   BILIRUBINUR NEGATIVE 11/04/2019 0911   KETONESUR NEGATIVE 11/04/2019 0911   PROTEINUR NEGATIVE 11/04/2019 0911   UROBILINOGEN 0.2 07/28/2015 2223   NITRITE NEGATIVE 11/04/2019 0911   LEUKOCYTESUR MODERATE (A) 11/04/2019  [id number]     Estill Cotta M.D. [facility] 02/05/2020, 3:56 PM   Call night coverage person covering after 7pm

## 2020-02-05 NOTE — Care Management Important Message (Signed)
Important Message  Patient Details IM Letter given to Dessa Phi RN Case Manager to present to the Patient Name: [name] MRN: [id number] Date of Birth: 10/20/1946   Medicare Important Message Given:  Yes     Kerin Salen 02/05/2020, 10:39 AM

## 2020-02-05 NOTE — Evaluation (Signed)
Physical Therapy Evaluation Patient Details Name: [name] MRN: [id number] DOB: 31-Jan-1946 Today's Date: 02/05/2020   History of Present Illness  74 year old female with anxiety, depression, asthma, COPD, active smoker, carotid artery occlusion, CKD chronic pain of lower extremities, fibromyalgia, spinal stenosis of lumbar region, DJD, history of DVT, CAD, chronic diarrhea, GERD, Graves' disease, chronic headaches, hypertension, Parkinson's disease, PVD, diabetes mellitus presented due to progressively worsening dyspnea for the past few days.  Pt admitted for Acute respiratory failure with hypoxemia, likely secondary to acute on chronic diastolic CHF  Clinical Impression  Pt admitted with above diagnosis. Pt currently with functional limitations due to the deficits listed below (see PT Problem List). Pt will benefit from skilled PT to increase their independence and safety with mobility to allow discharge to the venue listed below.  Pt reports furniture walking at home and reports SOB with activities.  Pt assisted with ambulating however only tolerated short distance due to dyspnea and back pain.  Pt would benefit from SNF upon d/c since she lives alone.     Follow Up Recommendations SNF;Supervision/Assistance - 24 hour    Equipment Recommendations  None recommended by PT    Recommendations for Other Services       Precautions / Restrictions Precautions Precautions: Fall Precaution Comments: monitor sats      Mobility  Bed Mobility               General bed mobility comments: pt in recliner on arrival  Transfers Overall transfer level: Needs assistance Equipment used: Rolling walker (2 wheeled) Transfers: Sit to/from Stand Sit to Stand: Min assist         General transfer comment: assist to steady with rise  Ambulation/Gait Ambulation/Gait assistance: Min assist Gait Distance (Feet): 20 Feet Assistive device: Rolling walker (2 wheeled) Gait Pattern/deviations:  Step-through pattern;Decreased stride length;Trunk flexed     General Gait Details: verbal cues for use of RW, SPO2 98% on room air upon returning to room (monitor pulse ox malfunction, and dynamap needed charging)  Stairs            Wheelchair Mobility    Modified Rankin (Stroke Patients Only)       Balance Overall balance assessment: Needs assistance         Standing balance support: Bilateral upper extremity supported Standing balance-Leahy Scale: Poor Standing balance comment: reliant on UE support                             Pertinent Vitals/Pain Pain Assessment: Faces Faces Pain Scale: Hurts whole lot Pain Location: back (appears to have raised left side and scoliosis observed with gown covering) Pain Descriptors / Indicators: Grimacing;Sore;Tender Pain Intervention(s): Repositioned;Monitored during session;Patient requesting pain meds-RN notified    Home Living Family/patient expects to be discharged to:: Private residence Living Arrangements: Alone   Type of Home: House Home Access: Stairs to enter Entrance Stairs-Rails: Right Entrance Stairs-Number of Steps: 2 Home Layout: One level Home Equipment: [place] - single point;Walker - 2 wheels;Shower seat;Grab bars - tub/shower;Walker - 4 wheels      Prior Function Level of Independence: Independent with assistive device(s)         Comments: Furniture walker at home; uses rollator for community. Does not drive.     Hand Dominance        Extremity/Trunk Assessment        Lower Extremity Assessment Lower Extremity Assessment: Generalized weakness    Cervical /  Trunk Assessment Cervical / Trunk Assessment: Kyphotic(?kyphoscoliosis however no formal diagnosis)  Communication   Communication: HOH  Cognition Arousal/Alertness: Awake/alert Behavior During Therapy: WFL for tasks assessed/performed Overall Cognitive Status: Within Functional Limits for tasks assessed                                         General Comments      Exercises     Assessment/Plan    PT Assessment Patient needs continued PT services  PT Problem List Decreased strength;Decreased mobility;Decreased balance;Decreased knowledge of use of DME;Decreased activity tolerance;Cardiopulmonary status limiting activity       PT Treatment Interventions DME instruction;Therapeutic activities;Gait training;Therapeutic exercise;Functional mobility training;Balance training;Patient/family education    PT Goals (Current goals can be found in the Care Plan section)  Acute Rehab PT Goals PT Goal Formulation: With patient Time For Goal Achievement: 02/19/20 Potential to Achieve Goals: Good    Frequency Min 3X/week   Barriers to discharge        Co-evaluation               AM-PAC PT "6 Clicks" Mobility  Outcome Measure Help needed turning from your back to your side while in a flat bed without using bedrails?: A Little Help needed moving from lying on your back to sitting on the side of a flat bed without using bedrails?: A Little Help needed moving to and from a bed to a chair (including a wheelchair)?: A Little Help needed standing up from a chair using your arms (e.g., wheelchair or bedside chair)?: A Little Help needed to walk in hospital room?: A Little Help needed climbing 3-5 steps with a railing? : A Lot 6 Click Score: 17    End of Session Equipment Utilized During Treatment: Gait belt Activity Tolerance: Patient tolerated treatment well Patient left: in chair;with call bell/phone within reach Nurse Communication: Mobility status PT Visit Diagnosis: Other abnormalities of gait and mobility (R26.89)    Time: 4709-6283 PT Time Calculation (min) (ACUTE ONLY): 20 min   Charges:   PT Evaluation $PT Eval Low Complexity: 1 Low         Kati PT, DPT Acute Rehabilitation Services Office: [phone] [place] Ram E 02/05/2020, 12:12 PM

## 2020-02-05 NOTE — NC FL2 (Signed)
[place] LEVEL OF CARE SCREENING TOOL     IDENTIFICATION  Patient Name: [name] Birthdate: 1946/04/10 Sex: female Admission Date (Current Location): 02/02/2020  [employer] [facility] [employer] and [place] Number:  Herbalist and Address:  [facility],  [place] [address], [place]      Provider Number: [id number]  Attending Physician Name and Address:  Mendel Corning, MD  Relative Name and Phone Number:  [name] [phone]    Current Level of Care: Hospital Recommended Level of Care: [place] Prior Approval Number:    Date Approved/Denied:   PASRR Number: [id number]  Discharge Plan: SNF    Current Diagnoses: Patient Active Problem List   Diagnosis Date Noted  . SOB (shortness of breath)   . Coronary artery calcification seen on CAT scan   . Acute respiratory failure with hypoxemia ([place]) 02/02/2020  . Tremor 02/02/2020  . Vitamin B 12 deficiency   . Malnutrition of moderate degree 12/24/2019  . Acute on chronic diastolic CHF (congestive heart failure) ([place]) 12/21/2019  . CKD (chronic kidney disease) stage 3, GFR 30-59 ml/min 12/21/2019  . Hypertensive urgency 11/20/2019  . Flash pulmonary edema ([place])   . Positive D dimer   . Acute respiratory failure with hypercapnia ([place]) 11/04/2019  . Acute exacerbation of CHF (congestive heart failure) ([place]) 11/04/2019  . DNR (do not resuscitate) 11/04/2019  . Chronic pain syndrome 11/04/2019  . Pulmonary nodule, left 08/02/2018  . Major depressive disorder, single episode, moderate ([place]) 08/02/2018  . Benzodiazepine overdose of undetermined intent 08/01/2018  . Cellulitis 05/14/2018  . Anemia 05/14/2018  . At risk for adverse drug event 10/17/2017  . Salicylate intoxication, undetermined intent, subsequent encounter 10/10/2017  . Acute renal failure with acute renal cortical necrosis superimposed on stage 3a chronic kidney disease ([place]) 10/10/2017  . Headache 10/10/2017  .  Severe protein-calorie malnutrition ([place]) 01/26/2016  . Opiate overdose ([place]) 01/25/2016  . Suicide attempt ([place]) 01/25/2016  . Depression   . Dyslipidemia   . Gastroesophageal reflux disease without esophagitis   . COPD (chronic obstructive pulmonary disease) ([place]) 07/29/2015  . Diabetes mellitus with neurological manifestation ([place]) 07/29/2015  . Frequent falls 07/29/2015  . HTN (hypertension) 07/29/2015  . AKI (acute kidney injury) ([place]) 07/29/2015  . SIRS (systemic inflammatory response syndrome) ([place]) 07/29/2015  . Spinal stenosis of lumbar region 04/23/2013  . Tremor due to multiple drugs 04/23/2013  . Tobacco use disorder 04/23/2013  . COPD exacerbation ([place]) 04/23/2013  . Occlusion and stenosis of carotid artery without mention of cerebral infarction 03/12/2012  . Viral bronchitis-possible h. infl vs Norovirus 11/21/2011  . Pleuritic chest pain 09/18/2011    Orientation RESPIRATION BLADDER Height & Weight     Self, Time, Situation, Place  O2(02 1lnc continuous) Continent Weight: 47.2 kg Height:  5\' 4"  (162.6 cm)  BEHAVIORAL SYMPTOMS/MOOD NEUROLOGICAL BOWEL NUTRITION STATUS      Continent Diet(Heart Healthy)  AMBULATORY STATUS COMMUNICATION OF NEEDS Skin   Limited Assist Verbally Normal                       Personal Care Assistance Level of Assistance  Bathing, Feeding, Dressing Bathing Assistance: Limited assistance Feeding assistance: Limited assistance Dressing Assistance: Limited assistance     Functional Limitations Info  Sight, Hearing, Speech Sight Info: Impaired(eyeglasses) Hearing Info: Adequate Speech Info: Adequate    SPECIAL CARE FACTORS FREQUENCY  PT (By licensed PT), OT (By licensed OT)  PT Frequency: 5x week OT Frequency: 5x week            Contractures Contractures Info: Not present    Additional Factors Info  Code Status, Allergies, Psychotropic Code Status Info: DNR Allergies Info: NKA Psychotropic Info:  cymbalta/duloxetine 20mg  po qd-anxiety/depression         Current Medications (02/05/2020):  This is the current hospital active medication list Current Facility-Administered Medications  Medication Dose Route Frequency Provider Last Rate Last Admin  . acetaminophen (TYLENOL) tablet 650 mg  650 mg Oral Q6H PRN Reubin Milan, MD   650 mg at 02/05/20 0440   Or  . acetaminophen (TYLENOL) suppository 650 mg  650 mg Rectal Q6H PRN Reubin Milan, MD      . allopurinol (ZYLOPRIM) tablet 100 mg  100 mg Oral Daily Rai, Ripudeep K, MD   100 mg at 02/05/20 0950  . amLODipine (NORVASC) tablet 10 mg  10 mg Oral Daily Reubin Milan, MD   10 mg at 02/05/20 0949  . arformoterol (BROVANA) nebulizer solution 15 mcg  15 mcg Nebulization BID Rai, Ripudeep K, MD   15 mcg at 02/05/20 0902  . benzonatate (TESSALON) capsule 100 mg  100 mg Oral TID Rai, Ripudeep K, MD   100 mg at 02/05/20 0949  . budesonide (PULMICORT) nebulizer solution 0.5 mg  0.5 mg Nebulization BID Ollis, Brandi L, NP   0.5 mg at 02/05/20 0857  . doxycycline (VIBRA-TABS) tablet 100 mg  100 mg Oral Q12H Rai, Ripudeep K, MD   100 mg at 02/05/20 0950  . DULoxetine (CYMBALTA) DR capsule 20 mg  20 mg Oral Daily Reubin Milan, MD   20 mg at 02/05/20 0949  . enoxaparin (LOVENOX) injection 30 mg  30 mg Subcutaneous Q24H Reubin Milan, MD   30 mg at 02/04/20 2252  . feeding supplement (BOOST / RESOURCE BREEZE) liquid 1 Container  1 Container Oral TID BM Rai, Vernelle Emerald, MD   1 Container at 02/04/20 2251  . guaiFENesin-dextromethorphan (ROBITUSSIN DM) 100-10 MG/5ML syrup 5 mL  5 mL Oral Q4H PRN Reubin Milan, MD   5 mL at 02/02/20 2215  . HYDROcodone-acetaminophen (NORCO/VICODIN) 5-325 MG per tablet 1 tablet  1 tablet Oral Q4H PRN Rai, Vernelle Emerald, MD   1 tablet at 02/05/20 0821  . insulin aspart (novoLOG) injection 0-15 Units  0-15 Units Subcutaneous TID WC Reubin Milan, MD   2 Units at 02/05/20 1206  .  ipratropium-albuterol (DUONEB) 0.5-2.5 (3) MG/3ML nebulizer solution 3 mL  3 mL Nebulization TID Reubin Milan, MD   3 mL at 02/05/20 1424  . methylPREDNISolone sodium succinate (SOLU-MEDROL) 125 mg/2 mL injection 60 mg  60 mg Intravenous Q12H Rai, Ripudeep K, MD   60 mg at 02/05/20 0433  . metoprolol succinate (TOPROL-XL) 24 hr tablet 25 mg  25 mg Oral Daily Reubin Milan, MD   25 mg at 02/05/20 0949  . multivitamin with minerals tablet 1 tablet  1 tablet Oral Daily Rai, Ripudeep K, MD   1 tablet at 02/05/20 0949  . nicotine (NICODERM CQ - dosed in mg/24 hours) patch 14 mg  14 mg Transdermal Daily Reubin Milan, MD   14 mg at 02/05/20 0950  . pantoprazole (PROTONIX) EC tablet 40 mg  40 mg Oral BID Reubin Milan, MD   40 mg at 02/05/20 0950  . prochlorperazine (COMPAZINE) injection 5 mg  5 mg Intravenous Q4H PRN Reubin Milan, MD      .  sodium chloride (OCEAN) 0.65 % nasal spray 1 spray  1 spray Each Nare PRN Rai, Vernelle Emerald, MD         Discharge Medications: Please see discharge summary for a list of discharge medications.  Relevant Imaging Results:  Relevant Lab Results:   Additional Information [id number]  Dessa Phi, RN

## 2020-02-06 DIAGNOSIS — J441 Chronic obstructive pulmonary disease with (acute) exacerbation: Secondary | ICD-10-CM | POA: Diagnosis not present

## 2020-02-06 DIAGNOSIS — I5033 Acute on chronic diastolic (congestive) heart failure: Secondary | ICD-10-CM | POA: Diagnosis not present

## 2020-02-06 DIAGNOSIS — I2510 Atherosclerotic heart disease of native coronary artery without angina pectoris: Secondary | ICD-10-CM | POA: Diagnosis not present

## 2020-02-06 DIAGNOSIS — J9601 Acute respiratory failure with hypoxia: Secondary | ICD-10-CM | POA: Diagnosis not present

## 2020-02-06 LAB — BASIC METABOLIC PANEL
Anion gap: 14 (ref 5–15)
BUN: 89 mg/dL — ABNORMAL HIGH (ref 8–23)
CO2: 22 mmol/L (ref 22–32)
Calcium: 8.4 mg/dL — ABNORMAL LOW (ref 8.9–10.3)
Chloride: 101 mmol/L (ref 98–111)
Creatinine, Ser: 2.46 mg/dL — ABNORMAL HIGH (ref 0.44–1.00)
GFR calc Af Amer: 22 mL/min — ABNORMAL LOW (ref >60)
GFR calc non Af Amer: 19 mL/min — ABNORMAL LOW (ref >60)
Glucose, Bld: 133 mg/dL — ABNORMAL HIGH (ref 70–99)
Potassium: 4.4 mmol/L (ref 3.5–5.1)
Sodium: 137 mmol/L (ref 135–145)

## 2020-02-06 LAB — GLUCOSE, CAPILLARY
Glucose-Capillary: 135 mg/dL — ABNORMAL HIGH (ref 70–99)
Glucose-Capillary: 184 mg/dL — ABNORMAL HIGH (ref 70–99)
Glucose-Capillary: 115 mg/dL — ABNORMAL HIGH (ref 70–99)
Glucose-Capillary: 341 mg/dL — ABNORMAL HIGH (ref 70–99)

## 2020-02-06 MED ORDER — METHYLPREDNISOLONE SODIUM SUCC 40 MG IJ SOLR
40.0000 mg | Freq: Two times a day (BID) | INTRAMUSCULAR | Status: DC
  Administered 2020-02-06 – 2020-02-07 (×2): 40 mg via INTRAVENOUS
  Filled 2020-02-06 (×2): qty 1

## 2020-02-06 MED ORDER — IPRATROPIUM-ALBUTEROL 0.5-2.5 (3) MG/3ML IN SOLN
3.0000 mL | Freq: Two times a day (BID) | RESPIRATORY_TRACT | Status: DC
  Administered 2020-02-07 – 2020-02-08 (×3): 3 mL via RESPIRATORY_TRACT
  Filled 2020-02-06 (×3): qty 3

## 2020-02-06 NOTE — Progress Notes (Addendum)
[facility]                                                                              Patient Demographics  [name], is a 74 y.o. female, DOB - 06/12/1946, HYW:[id number]  Admit date - 02/02/2020   Admitting Physician Reubin Milan, MD  Outpatient Primary MD for the patient is [name], MD  Outpatient specialists:   LOS - 3  days   Medical records reviewed and are as summarized below:    Chief Complaint  Patient presents with  . Shortness of Breath       Brief summary   Patient is a 74 year old female with anxiety, depression, asthma, COPD, active smoker, carotid artery occlusion, CKD chronic pain of lower extremities, fibromyalgia, spinal stenosis of lumbar region, DJD, history of DVT, CAD, chronic diarrhea, GERD, Graves' disease, chronic headaches, hypertension, Parkinson's disease, PVD diabetes mellitus presented due to progressively worsening dyspnea for the past few days.  Patient reported using her bronchodilators without significant results at home.  Patient received DuoNeb, Solu-Medrol via EMS and was placed on nonrebreather transiently. BNP 1305, chest x-ray showed worsening interstitial opacities favoring edema, small left pleural effusion  Assessment & Plan    Principal Problem:   Acute respiratory failure with hypoxemia ([place]), likely secondary to acute on chronic diastolic CHF -Patient was placed on IV Lasix for diuresis, now creatinine has trended up.  Lasix has been on hold -- creatinine worsened to 2.4 -Cardiology was consulted, seen by Dr. Radford Pax, felt severe COPD also contributing -2D echo showed EF of 50 to 48%, grade 1 diastolic dysfunction, mildly elevated PA pressure   Active Problems:  AKI superimposed on CKD Stage IIIb - baseline Cr 1.4-1.7 - cr trended up to 2.4 likely due to diuresis  - follow BMET closely    Severe COPD exacerbation -Patient required NRB transiently and IV steroids, nebs by  EMS -Wheezing improving, continue Pulmicort, Brovana, flutter valve -Continue IV steroids today, transition to oral prednisone in a.m. -Continue doxycycline, Robitussin, duo nebs -Appreciate pulmonology recommendations    Pulmonary nodule, incidentally seen on CT chest -CT chest showed 16 mm nodule in the subpleural left upper lobe worrisome for bronchogenic carcinoma -Appreciate pulmonology recommendations, recommended navigational biopsy LUL nodule with EBUS, timing per pulmonology   Hyperkalemia -Received Lokelma on 4/7 Currently normal    Diabetes mellitus, type II with hyperglycemia uncontrolled -CBGs likely uncontrolled due to steroids.  Hemoglobin A1c 5.6 Continue sliding scale insulin    HTN (hypertension) -BP currently stable Continue amlodipine, metoprolol    Depression -Continue Cymbalta    Tremor  -Patient has history of Parkinson's disease, recommended outpatient follow-up with neurology  Dyslipidemia Continue atorvastatin 20 mg p.o. daily.    Gastroesophageal reflux disease without esophagitis Protonix 40 mg p.o. daily.    Vitamin B 12 deficiency Has not had a injection in several months. Received cyanocobalamin 1000 mcg IM x1   Protein calorie malnutrition Estimated body mass index is 18.39 kg/m as calculated from the following:   Height as of this encounter: 5\' 4"  (1.626 m).   Weight as of this encounter: 48.6 kg.  Code Status:  DNR DVT Prophylaxis: Lovenox Family Communication: Discussed all imaging results, lab results, explained to the patient    Disposition Plan: Patient from home, PT recommended skilled nursing facility  Time Spent in minutes 25 minutes  Procedures:  None  Consultants:   Cardiology Pulmonology  Antimicrobials:   Anti-infectives (From admission, onward)   Start     Dose/Rate Route Frequency Ordered Stop   02/04/20 1545  doxycycline (VIBRA-TABS) tablet 100 mg     100 mg Oral Every 12 hours 02/04/20 1452            Medications  Scheduled Meds: . allopurinol  100 mg Oral Daily  . amLODipine  10 mg Oral Daily  . arformoterol  15 mcg Nebulization BID  . benzonatate  100 mg Oral TID  . budesonide (PULMICORT) nebulizer solution  0.5 mg Nebulization BID  . doxycycline  100 mg Oral Q12H  . DULoxetine  20 mg Oral Daily  . enoxaparin (LOVENOX) injection  30 mg Subcutaneous Q24H  . feeding supplement  1 Container Oral TID BM  . insulin aspart  0-15 Units Subcutaneous TID WC  . ipratropium-albuterol  3 mL Nebulization TID  . methylPREDNISolone (SOLU-MEDROL) injection  40 mg Intravenous Q12H  . metoprolol succinate  25 mg Oral Daily  . multivitamin with minerals  1 tablet Oral Daily  . nicotine  14 mg Transdermal Daily  . pantoprazole  40 mg Oral BID   Continuous Infusions: PRN Meds:.acetaminophen **OR** acetaminophen, guaiFENesin-dextromethorphan, HYDROcodone-acetaminophen, prochlorperazine, sodium chloride      Subjective:   [name] was seen and examined today.  No acute complaints except wants regular diet.  States still feels the same, has some shortness of breath.  No fevers or chills or chest pain.   Patient denies dizziness, abdominal pain, N/V/D/C, new weakness, numbess, tingling  Objective:   Vitals:   02/06/20 0535 02/06/20 0753 02/06/20 0809 02/06/20 1339  BP: 136/67 135/67  124/64  Pulse: 64 65 70 75  Resp:  17 16 16   Temp: 98.1 F (36.7 C) 98.2 F (36.8 C)  98 F (36.7 C)  TempSrc: Oral Oral  Oral  SpO2: 100% 100% 100% 99%  Weight:      Height:        Intake/Output Summary (Last 24 hours) at 02/06/2020 1421 Last data filed at 02/06/2020 1402 Gross per 24 hour  Intake 240 ml  Output 3 ml  Net 237 ml     Wt Readings from Last 3 Encounters:  02/06/20 48.6 kg  02/01/20 48.5 kg  01/31/20 48.5 kg    Physical Exam  General: Alert and oriented x 3, NAD  Cardiovascular: S1 S2 clear, RRR. No pedal edema b/l  Respiratory: Bilateral scattered rhonchi  with decreased breath sounds  Gastrointestinal: Soft, nontender, nondistended, NBS  Ext: no pedal edema bilaterally  Neuro: no new deficits, resting tremor right hand  Musculoskeletal: No cyanosis, clubbing  Skin: No rashes  Psych: Normal affect and demeanor, alert and oriented x3      Data Reviewed:  I have personally reviewed following labs and imaging studies  Micro Results Recent Results (from the past 240 hour(s))  SARS CORONAVIRUS 2 (TAT 6-24 HRS) Nasopharyngeal Nasopharyngeal Swab     Status: None   Collection Time: 02/02/20  7:54 AM   Specimen: Nasopharyngeal Swab  Result Value Ref Range Status   SARS Coronavirus 2 NEGATIVE NEGATIVE Final    Comment: (NOTE) SARS-CoV-2 target nucleic acids are NOT DETECTED. The SARS-CoV-2 RNA is generally detectable in  upper and lower respiratory specimens during the acute phase of infection. Negative results do not preclude SARS-CoV-2 infection, do not rule out co-infections with other pathogens, and should not be used as the sole basis for treatment or other patient management decisions. Negative results must be combined with clinical observations, patient history, and epidemiological information. The expected result is Negative. Fact Sheet for Patients: SugarRoll.be Fact Sheet for Healthcare Providers: https://www.woods-mathews.com/ This test is not yet approved or cleared by the [place] FDA and  has been authorized for detection and/or diagnosis of SARS-CoV-2 by FDA under an Emergency Use Authorization (EUA). This EUA will remain  in effect (meaning this test can be used) for the duration of the COVID-19 declaration under Section 56 4(b)(1) of the Act, 21 U.S.C. section 360bbb-3(b)(1), unless the authorization is terminated or revoked sooner. Performed at [facility], [place] [address], [place], [place] [address]   MRSA PCR Screening     Status: None   Collection Time:  02/02/20  6:43 PM   Specimen: Nasal Mucosa; Nasopharyngeal  Result Value Ref Range Status   MRSA by PCR NEGATIVE NEGATIVE Final    Comment:        The GeneXpert MRSA Assay (FDA approved for NASAL specimens only), is one component of a comprehensive MRSA colonization surveillance program. It is not intended to diagnose MRSA infection nor to guide or monitor treatment for MRSA infections. Performed at [facility], [place] [address] [place], [place] [address]     Radiology Reports DG Chest 2 View  Result Date: 02/01/2020 CLINICAL DATA:  Shortness of breath. EXAM: CHEST - 2 VIEW COMPARISON:  01/31/2020 FINDINGS: The cardiac silhouette, mediastinal and hilar contours are stable. Stable underlying chronic lung disease with emphysema and pulmonary scarring. Persistent left pleural effusion and overlying atelectasis and scarring. Stable vague rounded density in the left upper lobe. No definite acute pulmonary findings. IMPRESSION: Chronic lung disease with persistent left pleural effusion and overlying atelectasis and scarring. No definite acute overlying pulmonary process. Electronically Signed   By: Marijo Sanes M.D.   On: 02/01/2020 05:37   DG Chest 2 View  Result Date: 01/31/2020 CLINICAL DATA:  COPD exacerbation. EXAM: CHEST - 2 VIEW COMPARISON:  12/22/2019 FINDINGS: Left lung base opacity, similar to the prior exam, consistent with a small pleural effusion and associated lung base atelectasis or scarring. Lungs are hyperexpanded. There are chronic bronchitic changes at both lung bases, as well as bilateral interstitial thickening, findings are stable from the prior study. There is a nodular opacity that projects over the medial left upper lobe, which may be due to a combination of superimposed osseous structures, but raises possibility of a true lung nodule. This was not evident previously. No pneumothorax. Cardiac silhouette is normal in size. No mediastinal or hilar masses  or evidence of adenopathy. Skeletal structures are intact. IMPRESSION: 1. No convincing acute cardiopulmonary disease. 2. There are significant chronic findings including left lung base opacity that is consistent with a combination of pleural fluid and atelectasis or scarring, bilateral basilar bronchitic changes bilateral interstitial thickening. Lungs are hyperexpanded consistent with COPD. 3. Possible nodule in the medial left upper lobe. Recommend follow-up unenhanced chest CT on a nonemergent basis for further assessment. Electronically Signed   By: Lajean Manes M.D.   On: 01/31/2020 06:00   CT Chest Wo Contrast  Result Date: 02/02/2020 CLINICAL DATA:  shortness of breath and abnormal chest x-ray EXAM: CT CHEST WITHOUT CONTRAST TECHNIQUE: Multidetector CT imaging of  the chest was performed following the standard protocol without IV contrast. COMPARISON:  10/17/2017 FINDINGS: Cardiovascular: Borderline heart size. No pericardial effusion. There is aortic and coronary atherosclerosis. No acute vascular finding without contrast Mediastinum/Nodes: Increase in size of mediastinal lymph nodes since 2018 but strictly still within normal limits at 15 mm or less in short axis, largest located at the precarinal station. Surgical clips around the GE junction, likely hernia repair. Progressive venous collaterals in the chest wall and upper mediastinum. The left brachiocephalic vein is collapsed. Lungs/Pleura: Small bilateral pleural effusion which is dependent and layering. There is streaky density with volume loss at the lung bases. Scar-like appearance with equivocal cylindrical bronchiectasis at the medial segment right middle lobe. Mild generalized interlobular septal thickening at the lung bases. Subpleural nodule in the left upper lobe measuring 16 mm, new from 2018 when it was only a tiny subpleural nodule. Upper Abdomen: No acute finding. Musculoskeletal: No acute finding IMPRESSION: 1. 16 mm nodule in the  subpleural left upper lobe, worrisome for bronchogenic carcinoma. Recommend referral to multi disciplinary thoracic oncology. 2. Small pleural effusions and mild interstitial edema. Atelectasis is seen at both lung bases. 3. Progressive collaterals in the chest wall associated with left brachiocephalic vein occlusion/collapse. This may account for mild interval enlargement of mediastinal lymph nodes. 4.  Aortic Atherosclerosis (ICD10-I70.0).  Coronary atherosclerosis. Electronically Signed   By: Monte Fantasia M.D.   On: 02/02/2020 07:36   DG Chest Port 1 View  Result Date: 02/02/2020 CLINICAL DATA:  Shortness of breath EXAM: PORTABLE CHEST 1 VIEW COMPARISON:  Yesterday FINDINGS: Cardiomegaly with hazy and interstitial opacity. Small left pleural effusion. No pneumothorax. Postoperative left neck. IMPRESSION: Worsening interstitial opacity favoring edema. Small left pleural effusion. Electronically Signed   By: Monte Fantasia M.D.   On: 02/02/2020 05:52   ECHOCARDIOGRAM COMPLETE  Result Date: 02/04/2020    ECHOCARDIOGRAM REPORT   Patient Name:   JALONDA ANTIGUA Date of Exam: 02/04/2020 Medical Rec #:  [id number]       Height:       64.0 in Accession #:    [id number]      Weight:       107.6 lb Date of Birth:  03-02-1946        BSA:          1.503 m Patient Age:    1 years        BP:           119/68 mmHg Patient Gender: F               HR:           76 bpm. Exam Location:  Inpatient Procedure: 2D Echo Indications:    Congestive Heart Failure 428.0 / I50.9  History:        Patient has prior history of Echocardiogram examinations, most                 recent 11/04/2019. COPD; Risk Factors:Tobacco use, Hypertension,                 Diabetes and Dyslipidemia. AKI                 CKD.  Sonographer:    Vikki Ports Turrentine Referring Phys: [id number] Zerline Melchior K Marriana Hibberd IMPRESSIONS  1. Left ventricular ejection fraction, by estimation, is 50 to 55%. The left ventricle has low normal function. The left ventricle has no regional wall  motion abnormalities. Left ventricular diastolic parameters are  consistent with Grade I diastolic dysfunction (impaired relaxation).  2. Right ventricular systolic function is normal. The right ventricular size is normal. There is mildly elevated pulmonary artery systolic pressure. The estimated right ventricular systolic pressure is 84.6 mmHg.  3. The mitral valve is myxomatous. Mild to moderate mitral valve regurgitation. No evidence of mitral stenosis.  4. The aortic valve is normal in structure. Aortic valve regurgitation is not visualized. No aortic stenosis is present.  5. The inferior vena cava is normal in size with greater than 50% respiratory variability, suggesting right atrial pressure of 3 mmHg. Comparison(s): Prior images reviewed side by side. Mitral regurgitation has increased when compared to prior study. FINDINGS  Left Ventricle: Left ventricular ejection fraction, by estimation, is 50 to 55%. The left ventricle has low normal function. The left ventricle has no regional wall motion abnormalities. The left ventricular internal cavity size was normal in size. There is no left ventricular hypertrophy. Left ventricular diastolic parameters are consistent with Grade I diastolic dysfunction (impaired relaxation). Right Ventricle: The right ventricular size is normal. No increase in right ventricular wall thickness. Right ventricular systolic function is normal. There is mildly elevated pulmonary artery systolic pressure. The tricuspid regurgitant velocity is 2.74  m/s, and with an assumed right atrial pressure of 3 mmHg, the estimated right ventricular systolic pressure is 96.2 mmHg. Left Atrium: Left atrial size was normal in size. Right Atrium: Right atrial size was normal in size. Pericardium: There is no evidence of pericardial effusion. Mitral Valve: The mitral valve is myxomatous. There is moderate thickening of the mitral valve leaflet(s). Normal mobility of the mitral valve leaflets. Mild to  moderate mitral valve regurgitation. No evidence of mitral valve stenosis. Tricuspid Valve: The tricuspid valve is normal in structure. Tricuspid valve regurgitation is mild . No evidence of tricuspid stenosis. Aortic Valve: The aortic valve is normal in structure. Aortic valve regurgitation is not visualized. No aortic stenosis is present. Pulmonic Valve: The pulmonic valve was normal in structure. Pulmonic valve regurgitation is not visualized. No evidence of pulmonic stenosis. Aorta: The aortic root is normal in size and structure. Venous: The inferior vena cava is normal in size with greater than 50% respiratory variability, suggesting right atrial pressure of 3 mmHg. IAS/Shunts: No atrial level shunt detected by color flow Doppler.  LEFT VENTRICLE PLAX 2D LVIDd:         4.50 cm  Diastology LVIDs:         3.30 cm  LV e' lateral:   6.09 cm/s LV PW:         0.90 cm  LV E/e' lateral: 17.4 LV IVS:        0.90 cm  LV e' medial:    6.09 cm/s LVOT diam:     1.90 cm  LV E/e' medial:  17.4 LV SV:         50 LV SV Index:   34 LVOT Area:     2.84 cm  RIGHT VENTRICLE RV S prime:     12.80 cm/s TAPSE (M-mode): 2.0 cm LEFT ATRIUM             Index       RIGHT ATRIUM           Index LA diam:        3.10 cm 2.06 cm/m  RA Area:     12.20 cm LA Vol (A2C):   40.8 ml 27.14 ml/m RA Volume:   27.50 ml  18.29 ml/m LA Vol (  A4C):   39.7 ml 26.41 ml/m LA Biplane Vol: 41.0 ml 27.28 ml/m  AORTIC VALVE LVOT Vmax:   86.80 cm/s LVOT Vmean:  67.800 cm/s LVOT VTI:    0.178 m  AORTA Ao Root diam: 2.60 cm MITRAL VALVE                TRICUSPID VALVE MV Area (PHT): 3.83 cm     TR Peak grad:   30.0 mmHg MV Decel Time: 198 msec     TR Vmax:        274.00 cm/s MV E velocity: 106.00 cm/s MV A velocity: 117.00 cm/s  SHUNTS MV E/A ratio:  0.91         Systemic VTI:  0.18 m                             Systemic Diam: 1.90 cm Candee Furbish MD Electronically signed by Candee Furbish MD Signature Date/Time: 02/04/2020/11:53:29 AM    Final     Lab  Data:  CBC: Recent Labs  Lab 01/31/20 0550 02/01/20 0725 02/02/20 0546 02/03/20 0517  WBC 11.9* 12.2* 9.5 4.9  NEUTROABS 10.9* 8.2* 8.3*  --   HGB 8.5* 8.9* 8.3* 8.2*  HCT 28.2* 31.5* 28.3* 27.6*  MCV 91.3 94.3 91.3 91.1  PLT 293 408* 311 220   Basic Metabolic Panel: Recent Labs  Lab 02/02/20 0546 02/03/20 0517 02/04/20 0518 02/05/20 0511 02/06/20 0532  NA 141 142 140 140 137  K 4.1 5.2* 3.7 3.8 4.4  CL 114* 113* 106 103 101  CO2 16* 19* 22 19* 22  GLUCOSE 124* 132* 98 125* 133*  BUN 39* 54* 61* 75* 89*  CREATININE 1.54* 1.96* 1.93* 2.31* 2.46*  CALCIUM 8.4* 8.8* 8.1* 8.1* 8.4*  MG 2.5*  --   --   --   --   PHOS 4.5  --   --   --   --    GFR: Estimated Creatinine Clearance: 15.4 mL/min (A) (by C-G formula based on SCr of 2.46 mg/dL (H)). Liver Function Tests: Recent Labs  Lab 01/31/20 0550 02/03/20 0517  AST 14* 28  ALT 10 25  ALKPHOS 136* 131*  BILITOT 0.6 0.5  PROT 6.5 6.4*  ALBUMIN 3.4* 3.5   No results for input(s): LIPASE, AMYLASE in the last 168 hours. No results for input(s): AMMONIA in the last 168 hours. Coagulation Profile: No results for input(s): INR, PROTIME in the last 168 hours. Cardiac Enzymes: No results for input(s): CKTOTAL, CKMB, CKMBINDEX, TROPONINI in the last 168 hours. BNP (last 3 results) No results for input(s): PROBNP in the last 8760 hours. HbA1C: No results for input(s): HGBA1C in the last 72 hours. CBG: Recent Labs  Lab 02/05/20 1156 02/05/20 1641 02/05/20 2015 02/06/20 0748 02/06/20 1143  GLUCAP 149* 156* 175* 135* 184*   Lipid Profile: No results for input(s): CHOL, HDL, LDLCALC, TRIG, CHOLHDL, LDLDIRECT in the last 72 hours. Thyroid Function Tests: No results for input(s): TSH, T4TOTAL, FREET4, T3FREE, THYROIDAB in the last 72 hours. Anemia Panel: No results for input(s): VITAMINB12, FOLATE, FERRITIN, TIBC, IRON, RETICCTPCT in the last 72 hours. Urine analysis:    Component Value Date/Time   COLORURINE  STRAW (A) 11/04/2019 0911   APPEARANCEUR CLEAR 11/04/2019 0911   LABSPEC 1.010 11/04/2019 0911   PHURINE 5.0 11/04/2019 0911   GLUCOSEU NEGATIVE 11/04/2019 0911   HGBUR NEGATIVE 11/04/2019 0911   BILIRUBINUR NEGATIVE 11/04/2019 0911   Benjamin Stain  NEGATIVE 11/04/2019 0911   PROTEINUR NEGATIVE 11/04/2019 0911   UROBILINOGEN 0.2 07/28/2015 2223   NITRITE NEGATIVE 11/04/2019 0911   LEUKOCYTESUR MODERATE (A) 11/04/2019 0911     Jenaye Rickert M.D. [facility] 02/06/2020, 2:21 PM   Call night coverage person covering after 7pm

## 2020-02-07 DIAGNOSIS — I2510 Atherosclerotic heart disease of native coronary artery without angina pectoris: Secondary | ICD-10-CM | POA: Diagnosis not present

## 2020-02-07 DIAGNOSIS — J441 Chronic obstructive pulmonary disease with (acute) exacerbation: Secondary | ICD-10-CM | POA: Diagnosis not present

## 2020-02-07 DIAGNOSIS — J9601 Acute respiratory failure with hypoxia: Secondary | ICD-10-CM | POA: Diagnosis not present

## 2020-02-07 DIAGNOSIS — I5033 Acute on chronic diastolic (congestive) heart failure: Secondary | ICD-10-CM | POA: Diagnosis not present

## 2020-02-07 LAB — BASIC METABOLIC PANEL
Anion gap: 10 (ref 5–15)
BUN: 90 mg/dL — ABNORMAL HIGH (ref 8–23)
CO2: 23 mmol/L (ref 22–32)
Calcium: 8.4 mg/dL — ABNORMAL LOW (ref 8.9–10.3)
Chloride: 104 mmol/L (ref 98–111)
Creatinine, Ser: 2.16 mg/dL — ABNORMAL HIGH (ref 0.44–1.00)
GFR calc Af Amer: 25 mL/min — ABNORMAL LOW (ref >60)
GFR calc non Af Amer: 22 mL/min — ABNORMAL LOW (ref >60)
Glucose, Bld: 109 mg/dL — ABNORMAL HIGH (ref 70–99)
Potassium: 4.7 mmol/L (ref 3.5–5.1)
Sodium: 137 mmol/L (ref 135–145)

## 2020-02-07 LAB — GLUCOSE, CAPILLARY
Glucose-Capillary: 117 mg/dL — ABNORMAL HIGH (ref 70–99)
Glucose-Capillary: 269 mg/dL — ABNORMAL HIGH (ref 70–99)
Glucose-Capillary: 127 mg/dL — ABNORMAL HIGH (ref 70–99)

## 2020-02-07 MED ORDER — PREDNISONE 20 MG PO TABS
40.0000 mg | ORAL_TABLET | Freq: Every day | ORAL | Status: AC
  Administered 2020-02-08 – 2020-02-09 (×2): 40 mg via ORAL
  Filled 2020-02-07 (×2): qty 2

## 2020-02-07 MED ORDER — METHYLPREDNISOLONE SODIUM SUCC 40 MG IJ SOLR
40.0000 mg | Freq: Two times a day (BID) | INTRAMUSCULAR | Status: AC
  Administered 2020-02-07: 17:00:00 40 mg via INTRAVENOUS
  Filled 2020-02-07: qty 1

## 2020-02-07 NOTE — Progress Notes (Signed)
[facility]                                                                              Patient Demographics  [name], is a 74 y.o. female, DOB - July 19, 1946, WUX:[id number]  Admit date - 02/02/2020   Admitting Physician Reubin Milan, MD  Outpatient Primary MD for the patient is [name], MD  Outpatient specialists:   LOS - 4  days   Medical records reviewed and are as summarized below:    Chief Complaint  Patient presents with  . Shortness of Breath       Brief summary   Patient is a 74 year old female with anxiety, depression, asthma, COPD, active smoker, carotid artery occlusion, CKD chronic pain of lower extremities, fibromyalgia, spinal stenosis of lumbar region, DJD, history of DVT, CAD, chronic diarrhea, GERD, Graves' disease, chronic headaches, hypertension, Parkinson's disease, PVD diabetes mellitus presented due to progressively worsening dyspnea for the past few days.  Patient reported using her bronchodilators without significant results at home.  Patient received DuoNeb, Solu-Medrol via EMS and was placed on nonrebreather transiently. BNP 1305, chest x-ray showed worsening interstitial opacities favoring edema, small left pleural effusion  Assessment & Plan    Principal Problem:   Acute respiratory failure with hypoxemia ([place]), likely secondary to acute on chronic diastolic CHF -Patient was placed on IV Lasix for diuresis, now creatinine has trended up.  Lasix has been on hold -Cardiology was consulted, seen by Dr. Radford Pax, felt severe COPD also contributing -2D echo showed EF of 50 to 25%, grade 1 diastolic dysfunction, mildly elevated PA pressure -Creatinine plateaued at 2.4, now trending down to 2.6, Lasix has been on hold, euvolemic, negative balance of 1.7 L   Active Problems:  AKI superimposed on CKD Stage IIIb - baseline Cr 1.4-1.7 - cr trended up to 2.4 likely due to diuresis  -Creatinine now trending down,  2.1  Severe COPD exacerbation -Patient required NRB transiently and IV steroids, nebs by EMS -Wheezing much better, continue Pulmicort, Brovana, flutter valve. -Transition to oral prednisone in a.m. -Continue doxycycline, Robitussin, duo nebs -Appreciate pulmonology recommendations    Pulmonary nodule, incidentally seen on CT chest -CT chest showed 16 mm nodule in the subpleural left upper lobe worrisome for bronchogenic carcinoma -Appreciate pulmonology recommendations, recommended navigational biopsy LUL nodule with EBUS, timing per pulmonology - will check with pulmonology in am if biopsy is scheduled vs outpatient   Hyperkalemia -Received Lokelma on 4/7 Currently normal    Diabetes mellitus, type II with hyperglycemia uncontrolled -CBGs likely uncontrolled due to steroids.  Hemoglobin A1c 5.6 Continue sliding scale insulin    HTN (hypertension) -BP currently stable Continue amlodipine, metoprolol    Depression -Continue Cymbalta    Tremor  -Patient has history of Parkinson's disease, recommended outpatient follow-up with neurology  Dyslipidemia Continue atorvastatin 20 mg p.o. daily.    Gastroesophageal reflux disease without esophagitis Protonix 40 mg p.o. daily.    Vitamin B 12 deficiency Has not had a injection in several months. Received cyanocobalamin 1000 mcg IM x1   Protein calorie malnutrition Estimated body mass index is 18.25 kg/m as calculated from the  following:   Height as of this encounter: 5\' 4"  (1.626 m).   Weight as of this encounter: 48.2 kg.  Code Status: DNR DVT Prophylaxis: Lovenox Family Communication: Discussed all imaging results, lab results, explained to the patient    Disposition Plan: Patient from home, PT recommended skilled nursing facility If no plans for inpatient EBUS, bronc with biopsy by pulmonology, will DC in next 24 hours to skilled nursing facility if bed available  Time Spent in minutes 25 minutes  Procedures:   None  Consultants:   Cardiology Pulmonology  Antimicrobials:   Anti-infectives (From admission, onward)   Start     Dose/Rate Route Frequency Ordered Stop   02/04/20 1545  doxycycline (VIBRA-TABS) tablet 100 mg     100 mg Oral Every 12 hours 02/04/20 1452           Medications  Scheduled Meds: . allopurinol  100 mg Oral Daily  . amLODipine  10 mg Oral Daily  . arformoterol  15 mcg Nebulization BID  . benzonatate  100 mg Oral TID  . budesonide (PULMICORT) nebulizer solution  0.5 mg Nebulization BID  . doxycycline  100 mg Oral Q12H  . DULoxetine  20 mg Oral Daily  . enoxaparin (LOVENOX) injection  30 mg Subcutaneous Q24H  . feeding supplement  1 Container Oral TID BM  . insulin aspart  0-15 Units Subcutaneous TID WC  . ipratropium-albuterol  3 mL Nebulization BID  . methylPREDNISolone (SOLU-MEDROL) injection  40 mg Intravenous Q12H  . metoprolol succinate  25 mg Oral Daily  . multivitamin with minerals  1 tablet Oral Daily  . nicotine  14 mg Transdermal Daily  . pantoprazole  40 mg Oral BID   Continuous Infusions: PRN Meds:.acetaminophen **OR** acetaminophen, guaiFENesin-dextromethorphan, HYDROcodone-acetaminophen, prochlorperazine, sodium chloride      Subjective:   [name] was seen and examined today.  No acute complaints.  Shortness of breath and wheezing is improving.  No fevers or chills. Patient denies dizziness, abdominal pain, N/V/D/C, new weakness, numbess, tingling  Objective:   Vitals:   02/07/20 0435 02/07/20 0441 02/07/20 0833 02/07/20 1243  BP: 133/66   140/63  Pulse: 62  66 68  Resp: 17  20 17   Temp: 97.8 F (36.6 C)   98 F (36.7 C)  TempSrc: Oral   Oral  SpO2: 95%  96% 98%  Weight:  48.2 kg    Height:        Intake/Output Summary (Last 24 hours) at 02/07/2020 1540 Last data filed at 02/07/2020 1257 Gross per 24 hour  Intake 480 ml  Output --  Net 480 ml     Wt Readings from Last 3 Encounters:  02/07/20 48.2 kg  02/01/20  48.5 kg  01/31/20 48.5 kg   Physical Exam  General: Alert and oriented x 3, NAD  Cardiovascular: S1 S2 clear, RRR. No pedal edema b/l  Respiratory: Bilateral scattered rhonchi, improving  Gastrointestinal: Soft, nontender, nondistended, NBS  Ext: no pedal edema bilaterally  Neuro: no new deficits, resting tremor right hand, chronic  Musculoskeletal: No cyanosis, clubbing  Skin: No rashes  Psych: Normal affect and demeanor, alert and oriented x3       Data Reviewed:  I have personally reviewed following labs and imaging studies  Micro Results Recent Results (from the past 240 hour(s))  SARS CORONAVIRUS 2 (TAT 6-24 HRS) Nasopharyngeal Nasopharyngeal Swab     Status: None   Collection Time: 02/02/20  7:54 AM   Specimen: Nasopharyngeal Swab  Result  Value Ref Range Status   SARS Coronavirus 2 NEGATIVE NEGATIVE Final    Comment: (NOTE) SARS-CoV-2 target nucleic acids are NOT DETECTED. The SARS-CoV-2 RNA is generally detectable in upper and lower respiratory specimens during the acute phase of infection. Negative results do not preclude SARS-CoV-2 infection, do not rule out co-infections with other pathogens, and should not be used as the sole basis for treatment or other patient management decisions. Negative results must be combined with clinical observations, patient history, and epidemiological information. The expected result is Negative. Fact Sheet for Patients: SugarRoll.be Fact Sheet for Healthcare Providers: https://www.woods-mathews.com/ This test is not yet approved or cleared by the [place] FDA and  has been authorized for detection and/or diagnosis of SARS-CoV-2 by FDA under an Emergency Use Authorization (EUA). This EUA will remain  in effect (meaning this test can be used) for the duration of the COVID-19 declaration under Section 56 4(b)(1) of the Act, 21 U.S.C. section 360bbb-3(b)(1), unless the  authorization is terminated or revoked sooner. Performed at [facility], [place] [address] [place], [place] [address]   MRSA PCR Screening     Status: None   Collection Time: 02/02/20  6:43 PM   Specimen: Nasal Mucosa; Nasopharyngeal  Result Value Ref Range Status   MRSA by PCR NEGATIVE NEGATIVE Final    Comment:        The GeneXpert MRSA Assay (FDA approved for NASAL specimens only), is one component of a comprehensive MRSA colonization surveillance program. It is not intended to diagnose MRSA infection nor to guide or monitor treatment for MRSA infections. Performed at [facility], [place] [address] [place], [place] [address]     Radiology Reports DG Chest 2 View  Result Date: 02/01/2020 CLINICAL DATA:  Shortness of breath. EXAM: CHEST - 2 VIEW COMPARISON:  01/31/2020 FINDINGS: The cardiac silhouette, mediastinal and hilar contours are stable. Stable underlying chronic lung disease with emphysema and pulmonary scarring. Persistent left pleural effusion and overlying atelectasis and scarring. Stable vague rounded density in the left upper lobe. No definite acute pulmonary findings. IMPRESSION: Chronic lung disease with persistent left pleural effusion and overlying atelectasis and scarring. No definite acute overlying pulmonary process. Electronically Signed   By: Marijo Sanes M.D.   On: 02/01/2020 05:37   DG Chest 2 View  Result Date: 01/31/2020 CLINICAL DATA:  COPD exacerbation. EXAM: CHEST - 2 VIEW COMPARISON:  12/22/2019 FINDINGS: Left lung base opacity, similar to the prior exam, consistent with a small pleural effusion and associated lung base atelectasis or scarring. Lungs are hyperexpanded. There are chronic bronchitic changes at both lung bases, as well as bilateral interstitial thickening, findings are stable from the prior study. There is a nodular opacity that projects over the medial left upper lobe, which may be due to a combination of superimposed  osseous structures, but raises possibility of a true lung nodule. This was not evident previously. No pneumothorax. Cardiac silhouette is normal in size. No mediastinal or hilar masses or evidence of adenopathy. Skeletal structures are intact. IMPRESSION: 1. No convincing acute cardiopulmonary disease. 2. There are significant chronic findings including left lung base opacity that is consistent with a combination of pleural fluid and atelectasis or scarring, bilateral basilar bronchitic changes bilateral interstitial thickening. Lungs are hyperexpanded consistent with COPD. 3. Possible nodule in the medial left upper lobe. Recommend follow-up unenhanced chest CT on a nonemergent basis for further assessment. Electronically Signed   By: Lajean Manes M.D.   On: 01/31/2020  06:00   CT Chest Wo Contrast  Result Date: 02/02/2020 CLINICAL DATA:  shortness of breath and abnormal chest x-ray EXAM: CT CHEST WITHOUT CONTRAST TECHNIQUE: Multidetector CT imaging of the chest was performed following the standard protocol without IV contrast. COMPARISON:  10/17/2017 FINDINGS: Cardiovascular: Borderline heart size. No pericardial effusion. There is aortic and coronary atherosclerosis. No acute vascular finding without contrast Mediastinum/Nodes: Increase in size of mediastinal lymph nodes since 2018 but strictly still within normal limits at 15 mm or less in short axis, largest located at the precarinal station. Surgical clips around the GE junction, likely hernia repair. Progressive venous collaterals in the chest wall and upper mediastinum. The left brachiocephalic vein is collapsed. Lungs/Pleura: Small bilateral pleural effusion which is dependent and layering. There is streaky density with volume loss at the lung bases. Scar-like appearance with equivocal cylindrical bronchiectasis at the medial segment right middle lobe. Mild generalized interlobular septal thickening at the lung bases. Subpleural nodule in the left upper  lobe measuring 16 mm, new from 2018 when it was only a tiny subpleural nodule. Upper Abdomen: No acute finding. Musculoskeletal: No acute finding IMPRESSION: 1. 16 mm nodule in the subpleural left upper lobe, worrisome for bronchogenic carcinoma. Recommend referral to multi disciplinary thoracic oncology. 2. Small pleural effusions and mild interstitial edema. Atelectasis is seen at both lung bases. 3. Progressive collaterals in the chest wall associated with left brachiocephalic vein occlusion/collapse. This may account for mild interval enlargement of mediastinal lymph nodes. 4.  Aortic Atherosclerosis (ICD10-I70.0).  Coronary atherosclerosis. Electronically Signed   By: Monte Fantasia M.D.   On: 02/02/2020 07:36   DG Chest Port 1 View  Result Date: 02/02/2020 CLINICAL DATA:  Shortness of breath EXAM: PORTABLE CHEST 1 VIEW COMPARISON:  Yesterday FINDINGS: Cardiomegaly with hazy and interstitial opacity. Small left pleural effusion. No pneumothorax. Postoperative left neck. IMPRESSION: Worsening interstitial opacity favoring edema. Small left pleural effusion. Electronically Signed   By: Monte Fantasia M.D.   On: 02/02/2020 05:52   ECHOCARDIOGRAM COMPLETE  Result Date: 02/04/2020    ECHOCARDIOGRAM REPORT   Patient Name:   [name] Date of Exam: 02/04/2020 Medical Rec #:  [id number]       Height:       64.0 in Accession #:    [id number]      Weight:       107.6 lb Date of Birth:  [birth year]        BSA:          1.503 m Patient Age:    59 years        BP:           119/68 mmHg Patient Gender: F               HR:           76 bpm. Exam Location:  Inpatient Procedure: 2D Echo Indications:    Congestive Heart Failure 428.0 / I50.9  History:        Patient has prior history of Echocardiogram examinations, most                 recent 11/04/2019. COPD; Risk Factors:Tobacco use, Hypertension,                 Diabetes and Dyslipidemia. AKI                 CKD.  Sonographer:    Vikki Ports Turrentine Referring Phys: [id number]  [name] K Jasline Buskirk IMPRESSIONS  1.  Left ventricular ejection fraction, by estimation, is 50 to 55%. The left ventricle has low normal function. The left ventricle has no regional wall motion abnormalities. Left ventricular diastolic parameters are consistent with Grade I diastolic dysfunction (impaired relaxation).  2. Right ventricular systolic function is normal. The right ventricular size is normal. There is mildly elevated pulmonary artery systolic pressure. The estimated right ventricular systolic pressure is 91.4 mmHg.  3. The mitral valve is myxomatous. Mild to moderate mitral valve regurgitation. No evidence of mitral stenosis.  4. The aortic valve is normal in structure. Aortic valve regurgitation is not visualized. No aortic stenosis is present.  5. The inferior vena cava is normal in size with greater than 50% respiratory variability, suggesting right atrial pressure of 3 mmHg. Comparison(s): Prior images reviewed side by side. Mitral regurgitation has increased when compared to prior study. FINDINGS  Left Ventricle: Left ventricular ejection fraction, by estimation, is 50 to 55%. The left ventricle has low normal function. The left ventricle has no regional wall motion abnormalities. The left ventricular internal cavity size was normal in size. There is no left ventricular hypertrophy. Left ventricular diastolic parameters are consistent with Grade I diastolic dysfunction (impaired relaxation). Right Ventricle: The right ventricular size is normal. No increase in right ventricular wall thickness. Right ventricular systolic function is normal. There is mildly elevated pulmonary artery systolic pressure. The tricuspid regurgitant velocity is 2.74  m/s, and with an assumed right atrial pressure of 3 mmHg, the estimated right ventricular systolic pressure is 78.2 mmHg. Left Atrium: Left atrial size was normal in size. Right Atrium: Right atrial size was normal in size. Pericardium: There is no evidence of  pericardial effusion. Mitral Valve: The mitral valve is myxomatous. There is moderate thickening of the mitral valve leaflet(s). Normal mobility of the mitral valve leaflets. Mild to moderate mitral valve regurgitation. No evidence of mitral valve stenosis. Tricuspid Valve: The tricuspid valve is normal in structure. Tricuspid valve regurgitation is mild . No evidence of tricuspid stenosis. Aortic Valve: The aortic valve is normal in structure. Aortic valve regurgitation is not visualized. No aortic stenosis is present. Pulmonic Valve: The pulmonic valve was normal in structure. Pulmonic valve regurgitation is not visualized. No evidence of pulmonic stenosis. Aorta: The aortic root is normal in size and structure. Venous: The inferior vena cava is normal in size with greater than 50% respiratory variability, suggesting right atrial pressure of 3 mmHg. IAS/Shunts: No atrial level shunt detected by color flow Doppler.  LEFT VENTRICLE PLAX 2D LVIDd:         4.50 cm  Diastology LVIDs:         3.30 cm  LV e' lateral:   6.09 cm/s LV PW:         0.90 cm  LV E/e' lateral: 17.4 LV IVS:        0.90 cm  LV e' medial:    6.09 cm/s LVOT diam:     1.90 cm  LV E/e' medial:  17.4 LV SV:         50 LV SV Index:   34 LVOT Area:     2.84 cm  RIGHT VENTRICLE RV S prime:     12.80 cm/s TAPSE (M-mode): 2.0 cm LEFT ATRIUM             Index       RIGHT ATRIUM           Index LA diam:        3.10 cm  2.06 cm/m  RA Area:     12.20 cm LA Vol (A2C):   40.8 ml 27.14 ml/m RA Volume:   27.50 ml  18.29 ml/m LA Vol (A4C):   39.7 ml 26.41 ml/m LA Biplane Vol: 41.0 ml 27.28 ml/m  AORTIC VALVE LVOT Vmax:   86.80 cm/s LVOT Vmean:  67.800 cm/s LVOT VTI:    0.178 m  AORTA Ao Root diam: 2.60 cm MITRAL VALVE                TRICUSPID VALVE MV Area (PHT): 3.83 cm     TR Peak grad:   30.0 mmHg MV Decel Time: 198 msec     TR Vmax:        274.00 cm/s MV E velocity: 106.00 cm/s MV A velocity: 117.00 cm/s  SHUNTS MV E/A ratio:  0.91         Systemic VTI:   0.18 m                             Systemic Diam: 1.90 cm Candee Furbish MD Electronically signed by Candee Furbish MD Signature Date/Time: 02/04/2020/11:53:29 AM    Final     Lab Data:  CBC: Recent Labs  Lab 02/01/20 0725 02/02/20 0546 02/03/20 0517  WBC 12.2* 9.5 4.9  NEUTROABS 8.2* 8.3*  --   HGB 8.9* 8.3* 8.2*  HCT 31.5* 28.3* 27.6*  MCV 94.3 91.3 91.1  PLT 408* 311 604   Basic Metabolic Panel: Recent Labs  Lab 02/02/20 0546 02/02/20 0546 02/03/20 0517 02/04/20 0518 02/05/20 0511 02/06/20 0532 02/07/20 0539  NA 141   < > 142 140 140 137 137  K 4.1   < > 5.2* 3.7 3.8 4.4 4.7  CL 114*   < > 113* 106 103 101 104  CO2 16*   < > 19* 22 19* 22 23  GLUCOSE 124*   < > 132* 98 125* 133* 109*  BUN 39*   < > 54* 61* 75* 89* 90*  CREATININE 1.54*   < > 1.96* 1.93* 2.31* 2.46* 2.16*  CALCIUM 8.4*   < > 8.8* 8.1* 8.1* 8.4* 8.4*  MG 2.5*  --   --   --   --   --   --   PHOS 4.5  --   --   --   --   --   --    < > = values in this interval not displayed.   GFR: Estimated Creatinine Clearance: 17.4 mL/min (A) (by C-G formula based on SCr of 2.16 mg/dL (H)). Liver Function Tests: Recent Labs  Lab 02/03/20 0517  AST 28  ALT 25  ALKPHOS 131*  BILITOT 0.5  PROT 6.4*  ALBUMIN 3.5   No results for input(s): LIPASE, AMYLASE in the last 168 hours. No results for input(s): AMMONIA in the last 168 hours. Coagulation Profile: No results for input(s): INR, PROTIME in the last 168 hours. Cardiac Enzymes: No results for input(s): CKTOTAL, CKMB, CKMBINDEX, TROPONINI in the last 168 hours. BNP (last 3 results) No results for input(s): PROBNP in the last 8760 hours. HbA1C: No results for input(s): HGBA1C in the last 72 hours. CBG: Recent Labs  Lab 02/06/20 1143 02/06/20 1650 02/06/20 2021 02/07/20 0730 02/07/20 1124  GLUCAP 184* 115* 341* 117* 269*   Lipid Profile: No results for input(s): CHOL, HDL, LDLCALC, TRIG, CHOLHDL, LDLDIRECT in the last 72 hours. Thyroid Function  Tests: No results for  input(s): TSH, T4TOTAL, FREET4, T3FREE, THYROIDAB in the last 72 hours. Anemia Panel: No results for input(s): VITAMINB12, FOLATE, FERRITIN, TIBC, IRON, RETICCTPCT in the last 72 hours. Urine analysis:    Component Value Date/Time   COLORURINE STRAW (A) 11/04/2019 0911   APPEARANCEUR CLEAR 11/04/2019 0911   LABSPEC 1.010 11/04/2019 0911   PHURINE 5.0 11/04/2019 0911   GLUCOSEU NEGATIVE 11/04/2019 0911   HGBUR NEGATIVE 11/04/2019 0911   BILIRUBINUR NEGATIVE 11/04/2019 0911   KETONESUR NEGATIVE 11/04/2019 0911   PROTEINUR NEGATIVE 11/04/2019 0911   UROBILINOGEN 0.2 07/28/2015 2223   NITRITE NEGATIVE 11/04/2019 0911   LEUKOCYTESUR MODERATE (A) 11/04/2019 0911     Sheyli Horwitz M.D. [facility] 02/07/2020, 3:40 PM   Call night coverage person covering after 7pm

## 2020-02-08 LAB — BASIC METABOLIC PANEL
Anion gap: 11 (ref 5–15)
BUN: 89 mg/dL — ABNORMAL HIGH (ref 8–23)
CO2: 23 mmol/L (ref 22–32)
Calcium: 8.3 mg/dL — ABNORMAL LOW (ref 8.9–10.3)
Chloride: 99 mmol/L (ref 98–111)
Creatinine, Ser: 2.11 mg/dL — ABNORMAL HIGH (ref 0.44–1.00)
GFR calc Af Amer: 26 mL/min — ABNORMAL LOW (ref >60)
GFR calc non Af Amer: 22 mL/min — ABNORMAL LOW (ref >60)
Glucose, Bld: 106 mg/dL — ABNORMAL HIGH (ref 70–99)
Potassium: 4.8 mmol/L (ref 3.5–5.1)
Sodium: 133 mmol/L — ABNORMAL LOW (ref 135–145)

## 2020-02-08 LAB — GLUCOSE, CAPILLARY
Glucose-Capillary: 297 mg/dL — ABNORMAL HIGH (ref 70–99)
Glucose-Capillary: 98 mg/dL (ref 70–99)
Glucose-Capillary: 232 mg/dL — ABNORMAL HIGH (ref 70–99)
Glucose-Capillary: 199 mg/dL — ABNORMAL HIGH (ref 70–99)

## 2020-02-08 MED ORDER — IPRATROPIUM-ALBUTEROL 0.5-2.5 (3) MG/3ML IN SOLN: 3.0000 mL | Freq: Four times a day (QID) | RESPIRATORY_TRACT | Status: DC

## 2020-02-08 MED ORDER — IPRATROPIUM-ALBUTEROL 0.5-2.5 (3) MG/3ML IN SOLN
3.0000 mL | Freq: Two times a day (BID) | RESPIRATORY_TRACT | Status: DC
  Administered 2020-02-08 – 2020-02-12 (×9): 3 mL via RESPIRATORY_TRACT
  Filled 2020-02-08 (×9): qty 3

## 2020-02-08 MED ORDER — ENSURE ENLIVE PO LIQD: 237.0000 mL | Freq: Two times a day (BID) | ORAL | Status: DC

## 2020-02-08 MED ORDER — VITAMIN B-12 100 MCG PO TABS
100.0000 ug | ORAL_TABLET | Freq: Every day | ORAL | Status: DC
  Administered 2020-02-08 – 2020-02-12 (×4): 100 ug via ORAL
  Filled 2020-02-08 (×5): qty 1

## 2020-02-08 MED ORDER — IPRATROPIUM-ALBUTEROL 0.5-2.5 (3) MG/3ML IN SOLN: 3.0000 mL | Freq: Two times a day (BID) | RESPIRATORY_TRACT | Status: DC

## 2020-02-08 NOTE — TOC Progression Note (Signed)
Transition of Care [facility]) - Progression Note    Patient Details  Name: [name] MRN: [id number] Date of Birth: 1946/02/08  Transition of Care [facility]) CM/SW Contact  Rusty Villella, Juliann Pulse, RN Phone Number: 02/08/2020, 3:56 PM  Clinical Narrative:   1. 1.5 mi [facility] and [facility] [address] [place], [place] [address] [phone] Overall rating Much above average 2. 1.5 mi [place] at [place], [place] [address] [phone] Overall rating Below average 3. 2.2 mi [place], [place] [address] [phone] Overall rating Average 4. 2.4 mi [place] at [place], [place] [address] [phone] Overall rating Much below average 5. 2.7 mi [facility] at the [place], [place] [address] [phone] Overall rating Below average 6. 2.9 mi [facility] and [facility] [address] [place], [place] [address] [phone] Overall rating Much below average 7. 3.1 [place], [place] [address] [phone] Overall rating Much above average 8. 3.6 [place], [place] [address] [phone] Overall rating Much below average 9. 3.7 mi [facility] [address] [place], [name] [address] [phone] Overall rating Below average 10. 3.8 mi [facility] [place], [place] [address] [phone] Overall rating Below average 11. 4.2 mi [facility] at [place], [place] [address] [phone] Overall rating Much above average 12. 4.7 mi [facility] [address] [place], [place] [address] [phone] Overall  rating Much above average 13. 5.5 mi [facility] [address] [place], [place] [address] [phone] Overall rating Average 14. [place], [place] [address] [phone] Overall rating Above average 15. 8.8 mi [facility] and [place], [place] [address] [phone] Overall rating Much below average  16. 9 mi [facility] [address] [place], [place] [address] [phone] Overall rating Below average 17. 9.3 mi [facility] [address] [place], [place] [address] [phone] Overall rating Much above average 18. 10.8 mi [place] at [facility] [address] [place], [place] [address] [phone] Overall rating Much above average 19. 12.8 [facility] [address] [place], [place] [address] [phone] Overall rating Much below average 20. 12.8 mi [place], [place] [address] [phone] Overall rating Much below average 21. 13.1 mi [address] [place], [place] [address] [phone] Overall rating Much below average 22. 14.2 mi [place] [address] [place], [place] [address] [phone] Overall rating Much below average 23. 14.3 mi The [facility] [address] [place], [place] [address] [phone] Overall rating Much below average 24. 14.4 mi [facility] [place], [place] [address] [phone] Overall rating Below average 25. 14.8 mi [place], [place] [address] [phone] Overall rating Average 26. 16.5 mi [place] [address] [place],  [address] [phone] Overall  rating Below average 27. 16.7 mi [facility] [place],  [place] [address] [phone] Overall rating Much above average 28. 16.9 mi [facility] [place], [place] [address] [phone] Overall rating Much above average 29. 17.8 mi [facility] [place], [place] [address] [phone] Overall rating Average 30. 95.0 [facility] [address] [place], [place] [address] [phone] Overall rating Much below average  31. 19.8 mi [facility] and [facility] [address] [place], [place] [address] [phone] Overall rating Below average 32. 20.1 mi [address] at the [facility], [place] [address] [phone] Overall rating Much above average 33. 20.9 [place] [facility] [place], [place] [address] [phone] Overall rating Below average 34. 98.9 [facility] [address] [place], [place] [address] [phone] Overall rating Much below average 35. 21.7 [place], [place] [address] [phone] Overall rating Much above average 36. 21.9 mi [address] [place], [place] [address] [phone] Overall rating Below average 37. 22.1 [address] [place], [place] [address] [phone] Overall rating Much above average 38. 22.7 mi [facility] [address] [place], [place] [address] [phone] Overall rating Average 39. 22.7 mi [facility] [place], [place] [address] [phone] Overall rating Much below average 40. 23.4 mi [facility] [place] [address] [place], [place] [address] [phone] Overall rating Above average 41. 23.6 [place], [place] [address] [phone] Overall rating Average 42. 24.2 mi [facility] [address] [place], [place] [address] [phone] Overall rating Below average 43. 24.2 mi [place] [facility] [place] [address] [place], [place] [address] [phone] Overall rating Much below average 44. 24.5 [facility] [address] [place],  [address] [phone] Overall rating Much below average 45. 24.6 mi [facility] [place],  [address] [phone] Overall rating Much below average   Expected Discharge Plan: [place] Barriers to Discharge: Continued Medical Work up  Expected Discharge Plan and Services Expected Discharge Plan: [place]   Discharge Planning Services: CM Consult   Living arrangements for the past 2 months: Single Family Home                                       Social Determinants of Health (SDOH) Interventions    Readmission Risk Interventions Readmission Risk Prevention Plan 02/05/2020 12/24/2019 11/23/2019  Transportation Screening Complete Complete Complete  PCP or Specialist Appt within 3-5 Days - Complete Complete  HRI or [place] - Complete Complete  Social Work Consult for [place] Planning/Counseling - Complete Complete  Palliative Care Screening - Not Applicable Not Applicable  Medication Review Press photographer) Complete Complete Complete  PCP or Specialist appointment within 3-5 days of discharge Complete - -  HRI or Home Care Consult Complete - -  SW Recovery Care/Counseling Consult Complete - -  Palliative Care Screening Not Applicable - -  Skilled Nursing Facility Complete - -  Some recent data might be hidden

## 2020-02-08 NOTE — Progress Notes (Signed)
PROGRESS NOTE    [name]  POE:[id number] DOB: September 06, 1946 DOA: 02/02/2020 PCP: [name], MD   Brief Narrative: 74 year old with past medical history significant for anxiety, depression, asthma, COPD active smoker, CKD, fibromyalgia, spinal stenosis, history of DVT, CAD, Graves' disease, Parkinson disease, PVD, diabetes who presents with progressively worsening dyspnea for past few days prior to admission.  Patient reported using her bronchodilators without significant improvement.  Evaluation in the ED BNP 1300, chest x-ray showed worsening interstitial opacity favoring edema, small pleural effusion.  Patient was admitted for acute hypoxic respiratory failure secondary to acute on chronic diastolic heart failure and COPD exacerbation.  She was treated with IV Laxis.  Laxis has been on hold due to worsening renal function.  She was found to have a lung nodule, plan is to proceed with biopsy on 4/13   Assessment & Plan:   Principal Problem:   Acute respiratory failure with hypoxemia (HCC) Active Problems:   Diabetes mellitus with neurological manifestation (HCC)   HTN (hypertension)   Depression   Dyslipidemia   Gastroesophageal reflux disease without esophagitis   Pulmonary nodule, left   DNR (do not resuscitate)   Acute on chronic diastolic CHF (congestive heart failure) (HCC)   Vitamin B 12 deficiency   Tremor   SOB (shortness of breath)   Coronary artery calcification seen on CAT scan   1-Acute respiratory failure with hypoxemia likely secondary to acute on chronic diastolic heart failure and COPD exacerbation -Patient was placed on IV Lasix for diuresis, creatinine has increased.  Lasix has been on hold. -Creatinine has plateaued to 2.2. -Cardiology was consulted, seen by Dr. Radford Pax, felt severe COPD also contributing to symptoms. -Continue to monitor volume status  2-AKI superimposed on CKD stage IIIb -Creatinine baseline 1.4--1.7 -Creatinine increased up  to 2.4 due to Laxis -Creatinine decreased to 2.1 follow trend.  3-severe COPD exacerbation: Patient initially required NRB transiently and IV steroids and nebulizer treatment by EMS Continue with Pulmicort Brovana and flutter valve Is a scheduled nebulizer to every 6 hours. Treated with doxycycline.  4-Pulmonary nodule on CT chest: -CT chest showed 16 mm nodule in the subpleural left upper lobe worrisome for bronchogenic carcinoma. -Plan for another biopsy by EBUS tomorrow. received Lovenox today  5-hyperkalemia: Received Lokelma: Resolved  6-diabetes type 2 with hyperglycemia, uncontrolled: Continue with sliding scale insulin  Hypertension: Continue with amlodipine and metoprolol  Depression: Continue with Cymbalta Tremor: History of Parkinson's disease, need to follow-up with neurology as an outpatient  B12 deficiency: Received intramuscular 1 dose, will schedule oral supplement. Protein calorie malnutrition will order Ensure.   Nutrition Problem: Increased nutrient needs Etiology: catabolic illness(COPD)    Signs/Symptoms: estimated needs    Interventions: Boost Breeze, MVI  Estimated body mass index is 18.25 kg/m as calculated from the following:   Height as of this encounter: 5\' 4"  (1.626 m).   Weight as of this encounter: 48.2 kg.   DVT prophylaxis: SCD Code Status: DNR Family Communication: care discussed with patient.  Disposition Plan:  Patient is from: Home Anticipated d/c date: in 2 days, awaiting lung bx Barriers to d/c or necessity for inpatient status: SNF in 2 days, awaiting lung biopsy and improvement of renal function.   Consultants:   Pulmonology   Cardiology   Procedures:     Antimicrobials:    Subjective: Denies worsening dyspnea.   Objective: Vitals:   02/07/20 2115 02/08/20 0537 02/08/20 0825 02/08/20 1216  BP: (!) 129/52 134/60  115/71  Pulse: 68 (!)  59 64 68  Resp: 19 18 18 18   Temp: 98.2 F (36.8 C) 98.3 F (36.8 C)   (!) 97.4 F (36.3 C)  TempSrc: Oral Oral  Oral  SpO2: 97% 98% 97% 99%  Weight:      Height:        Intake/Output Summary (Last 24 hours) at 02/08/2020 1617 Last data filed at 02/08/2020 1032 Gross per 24 hour  Intake 240 ml  Output 500 ml  Net -260 ml   Filed Weights   02/05/20 0548 02/06/20 0500 02/07/20 0441  Weight: 47.2 kg 48.6 kg 48.2 kg    Examination:  General exam: Appears calm and comfortable  Respiratory system: Clear to auscultation. Respiratory effort normal. Cardiovascular system: S1 & S2 heard, RRR. No JVD, murmurs, rubs, gallops or clicks. No pedal edema. Gastrointestinal system: Abdomen is nondistended, soft and nontender. No organomegaly or masses felt. Normal bowel sounds heard. Central nervous system: Alert and oriented. No focal neurological deficits. Extremities: Symmetric 5 x 5 power. Skin: No rashes, lesions or ulcers Psychiatry: Judgement and insight appear normal. Mood & affect appropriate.     Data Reviewed: I have personally reviewed following labs and imaging studies  CBC: Recent Labs  Lab 02/02/20 0546 02/03/20 0517  WBC 9.5 4.9  NEUTROABS 8.3*  --   HGB 8.3* 8.2*  HCT 28.3* 27.6*  MCV 91.3 91.1  PLT 311 846   Basic Metabolic Panel: Recent Labs  Lab 02/02/20 0546 02/03/20 0517 02/04/20 0518 02/05/20 0511 02/06/20 0532 02/07/20 0539 02/08/20 0510  NA 141   < > 140 140 137 137 133*  K 4.1   < > 3.7 3.8 4.4 4.7 4.8  CL 114*   < > 106 103 101 104 99  CO2 16*   < > 22 19* 22 23 23   GLUCOSE 124*   < > 98 125* 133* 109* 106*  BUN 39*   < > 61* 75* 89* 90* 89*  CREATININE 1.54*   < > 1.93* 2.31* 2.46* 2.16* 2.11*  CALCIUM 8.4*   < > 8.1* 8.1* 8.4* 8.4* 8.3*  MG 2.5*  --   --   --   --   --   --   PHOS 4.5  --   --   --   --   --   --    < > = values in this interval not displayed.   GFR: Estimated Creatinine Clearance: 17.8 mL/min (A) (by C-G formula based on SCr of 2.11 mg/dL (H)). Liver Function Tests: Recent Labs  Lab  02/03/20 0517  AST 28  ALT 25  ALKPHOS 131*  BILITOT 0.5  PROT 6.4*  ALBUMIN 3.5   No results for input(s): LIPASE, AMYLASE in the last 168 hours. No results for input(s): AMMONIA in the last 168 hours. Coagulation Profile: No results for input(s): INR, PROTIME in the last 168 hours. Cardiac Enzymes: No results for input(s): CKTOTAL, CKMB, CKMBINDEX, TROPONINI in the last 168 hours. BNP (last 3 results) No results for input(s): PROBNP in the last 8760 hours. HbA1C: No results for input(s): HGBA1C in the last 72 hours. CBG: Recent Labs  Lab 02/07/20 1124 02/07/20 1626 02/07/20 2111 02/08/20 0810 02/08/20 1140  GLUCAP 269* 127* 297* 98 232*   Lipid Profile: No results for input(s): CHOL, HDL, LDLCALC, TRIG, CHOLHDL, LDLDIRECT in the last 72 hours. Thyroid Function Tests: No results for input(s): TSH, T4TOTAL, FREET4, T3FREE, THYROIDAB in the last 72 hours. Anemia Panel: No results for input(s): VITAMINB12,  FOLATE, FERRITIN, TIBC, IRON, RETICCTPCT in the last 72 hours. Sepsis Labs: No results for input(s): PROCALCITON, LATICACIDVEN in the last 168 hours.  Recent Results (from the past 240 hour(s))  SARS CORONAVIRUS 2 (TAT 6-24 HRS) Nasopharyngeal Nasopharyngeal Swab     Status: None   Collection Time: 02/02/20  7:54 AM   Specimen: Nasopharyngeal Swab  Result Value Ref Range Status   SARS Coronavirus 2 NEGATIVE NEGATIVE Final    Comment: (NOTE) SARS-CoV-2 target nucleic acids are NOT DETECTED. The SARS-CoV-2 RNA is generally detectable in upper and lower respiratory specimens during the acute phase of infection. Negative results do not preclude SARS-CoV-2 infection, do not rule out co-infections with other pathogens, and should not be used as the sole basis for treatment or other patient management decisions. Negative results must be combined with clinical observations, patient history, and epidemiological information. The expected result is Negative. Fact Sheet for  Patients: SugarRoll.be Fact Sheet for Healthcare Providers: https://www.woods-mathews.com/ This test is not yet approved or cleared by the [place] FDA and  has been authorized for detection and/or diagnosis of SARS-CoV-2 by FDA under an Emergency Use Authorization (EUA). This EUA will remain  in effect (meaning this test can be used) for the duration of the COVID-19 declaration under Section 56 4(b)(1) of the Act, 21 U.S.C. section 360bbb-3(b)(1), unless the authorization is terminated or revoked sooner. Performed at [facility], [place] [address], [place], [place] [address]   MRSA PCR Screening     Status: None   Collection Time: 02/02/20  6:43 PM   Specimen: Nasal Mucosa; Nasopharyngeal  Result Value Ref Range Status   MRSA by PCR NEGATIVE NEGATIVE Final    Comment:        The GeneXpert MRSA Assay (FDA approved for NASAL specimens only), is one component of a comprehensive MRSA colonization surveillance program. It is not intended to diagnose MRSA infection nor to guide or monitor treatment for MRSA infections. Performed at [facility], [place] [address], [place], [place] [address]          Radiology Studies: No results found.      Scheduled Meds: . allopurinol  100 mg Oral Daily  . amLODipine  10 mg Oral Daily  . arformoterol  15 mcg Nebulization BID  . benzonatate  100 mg Oral TID  . budesonide (PULMICORT) nebulizer solution  0.5 mg Nebulization BID  . doxycycline  100 mg Oral Q12H  . DULoxetine  20 mg Oral Daily  . feeding supplement  1 Container Oral TID BM  . insulin aspart  0-15 Units Subcutaneous TID WC  . ipratropium-albuterol  3 mL Nebulization BID  . metoprolol succinate  25 mg Oral Daily  . multivitamin with minerals  1 tablet Oral Daily  . nicotine  14 mg Transdermal Daily  . pantoprazole  40 mg Oral BID  . predniSONE  40 mg Oral QAC breakfast   Continuous Infusions:   LOS:  5 days    Time spent: 35 minutes.     Elmarie Shiley, MD [facility]   If 7PM-7AM, please contact night-coverage [email or web address]  02/08/2020, 4:17 PM

## 2020-02-08 NOTE — TOC Progression Note (Signed)
Transition of Care [facility]) - Progression Note    Patient Details  Name: [name] MRN: [id number] Date of Birth: 11-15-1945  [facility]) CM/SW Contact  Keilee Denman, Juliann Pulse, RN Phone Number: 02/08/2020, 3:25 PM  Clinical Narrative: TC [name] left vm on bed offers.    Expected Discharge Plan: Skilled Nursing Facility Barriers to Discharge: Continued Medical Work up  Expected Discharge Plan and Services Expected Discharge Plan: [place]   Discharge Planning Services: CM Consult   Living arrangements for the past 2 months: Single Family Home                                       Social Determinants of Health (SDOH) Interventions    Readmission Risk Interventions Readmission Risk Prevention Plan 02/05/2020 12/24/2019 11/23/2019  Transportation Screening Complete Complete Complete  PCP or Specialist Appt within 3-5 Days - Complete Complete  HRI or Home Care Consult - Complete Complete  Social Work Consult for [place] Planning/Counseling - Complete Complete  Palliative Care Screening - Not Applicable Not Applicable  Medication Review Press photographer) Complete Complete Complete  PCP or Specialist appointment within 3-5 days of discharge Complete - -  [place] or Home Care Consult Complete - -  SW Recovery Care/Counseling Consult Complete - -  Palliative Care Screening Not Applicable - -  Skilled Nursing Facility Complete - -  Some recent data might be hidden

## 2020-02-08 NOTE — Care Management Important Message (Signed)
Important Message  Patient Details IM Letter given to Dessa Phi RN Case Manager to present to the Patient Name: [name] MRN: [id number] Date of Birth: 07-18-1946   Medicare Important Message Given:  Yes     Kerin Salen 02/08/2020, 12:22 PM

## 2020-02-08 NOTE — Progress Notes (Signed)
Physical Therapy Treatment Patient Details Name: [name] MRN: [id number] DOB: 1946-10-07 Today's Date: 02/08/2020    History of Present Illness 74 year old female with anxiety, depression, asthma, COPD, active smoker, carotid artery occlusion, CKD chronic pain of lower extremities, fibromyalgia, spinal stenosis of lumbar region, DJD, history of DVT, CAD, chronic diarrhea, GERD, Graves' disease, chronic headaches, hypertension, Parkinson's disease, PVD, diabetes mellitus presented due to progressively worsening dyspnea for the past few days.  Pt admitted for Acute respiratory failure with hypoxemia, likely secondary to acute on chronic diastolic CHF    PT Comments    Progressing with mobility. O2 97% on RA. Pt continues to require Min assist for mobility. Mobility is limited by back pain and general weakness. Continue to recommend ST [facility] at [facility].    Follow Up Recommendations  SNF     Equipment Recommendations  None recommended by PT    Recommendations for Other Services       Precautions / Restrictions Precautions Precautions: Fall Precaution Comments: monitor sats Restrictions Weight Bearing Restrictions: No    Mobility  Bed Mobility               General bed mobility comments: pt in recliner on arrival  Transfers Overall transfer level: Needs assistance Equipment used: Rolling walker (2 wheeled) Transfers: Sit to/from Stand Sit to Stand: Min assist         General transfer comment: assist to steady with rise  Ambulation/Gait Ambulation/Gait assistance: Min assist Gait Distance (Feet): 40 Feet Assistive device: Rolling walker (2 wheeled) Gait Pattern/deviations: Step-through pattern;Decreased stride length     General Gait Details: Assist to steady throughout distance. O2 97% on RA. Distance limited by back pain and weakness.   Stairs             Wheelchair Mobility    Modified Rankin (Stroke Patients Only)       Balance Overall  balance assessment: Needs assistance         Standing balance support: Bilateral upper extremity supported Standing balance-Leahy Scale: Poor                              Cognition Arousal/Alertness: Awake/alert Behavior During Therapy: WFL for tasks assessed/performed Overall Cognitive Status: Within Functional Limits for tasks assessed                                        Exercises      General Comments        Pertinent Vitals/Pain Pain Assessment: 0-10 Pain Score: 7  Pain Location: back Pain Descriptors / Indicators: Aching;Grimacing;Guarding Pain Intervention(s): Limited activity within patient's tolerance;Monitored during session    Home Living                      Prior Function            PT Goals (current goals can now be found in the care plan section) Progress towards PT goals: Progressing toward goals    Frequency    Min 3X/week      PT Plan Current plan remains appropriate    Co-evaluation              AM-PAC PT "6 Clicks" Mobility   Outcome Measure  Help needed turning from your back to your side while in a flat bed without using  bedrails?: A Little Help needed moving from lying on your back to sitting on the side of a flat bed without using bedrails?: A Little Help needed moving to and from a bed to a chair (including a wheelchair)?: A Little Help needed standing up from a chair using your arms (e.g., wheelchair or bedside chair)?: A Little Help needed to walk in hospital room?: A Little Help needed climbing 3-5 steps with a railing? : A Lot 6 Click Score: 17    End of Session Equipment Utilized During Treatment: Gait belt Activity Tolerance: Patient tolerated treatment well Patient left: in chair;with call bell/phone within reach;with chair alarm set   PT Visit Diagnosis: Other abnormalities of gait and mobility (R26.89)     Time: 1550-1600 PT Time Calculation (min) (ACUTE ONLY): 10  min  Charges:  $Gait Training: 8-22 mins                        Doreatha Massed, PT Acute Rehabilitation

## 2020-02-08 NOTE — Progress Notes (Signed)
PCCM interval note  Patient preparing for navigational bronchoscopy, EBUS to evaluate left upper lobe nodule, mediastinal adenopathy.  She had her enoxaparin last night.  I would prefer to reschedule her.  She has been scheduled for Wednesday 4/14 at 10 AM in the [facility] OR, room 12.  I will ask our [facility] consultants to evaluate her preop  Thanks  Baltazar Apo, MD, PhD 02/08/2020, 7:51 AM [facility] [phone] or if no answer [phone]

## 2020-02-08 NOTE — Progress Notes (Signed)
NAME:  [name], MRN:  [id number], DOB:  08-25-1946, LOS: 5 ADMISSION DATE:  02/02/2020, CONSULTATION DATE:  02/04/20 REFERRING MD:  Dr. Tana Coast, CHIEF COMPLAINT:  Shortness of breath   Brief History   74 y/o F who was admitted on 4/6 with progressive shortness of breath.  CT imaging with enlarging left pulmonary nodule.   History of present illness   74 y/o F who presented to the ER on 4/6 with reports of progressive shortness of breath.   Of note, she was seen in the ER on 4/4, 4/5 and 4/6 for SOB. On 4/4 she reported she had run out of her home inhalers and was coughing more than usual.  CXR at that time showed unchanged LUL nodule with recommendation for non-emergent follow up.  She was treated with inhalers and discharged home.  She returned on 4/5 with ongoing SOB. CXR was unchanged.  She was treated with nebulized bronchodilators with improvement.  At discharge she was given an albuterol inhaler and 5 day course of prednisone.   The patient reports her primary care MD changed and she does not feel comfortable with her new one.  She indicates she ran out of all of her medications and has not been taking anything for at least two months.  She ran out of her inhalers approximately 2 weeks prior to admit (Combivent).  She states they "don't help anyway".  The patient reports she began smoking at age 89, smoked up to 3ppd, now smokes 1ppd.  States she has always been thin. Reports increasing SOB, cough that is largely non-productive. Denies fevers.   PCCM consulted for evaluation of nodule, dyspnea.   Past Medical History  COPD  OSA  Tobacco Abuse  Known Left Pulmonary Nodule - present in 07/2018 Asthma  DVT  Carotid Artery Occlusion  CAD / PVD HTN CHF - diastolic  Anemia  DM  CKD III  Parkinson's Disease  Anxiety  GERD  Graves Disease  Fibromyalgia  Chronic Pain Malnutrition   Significant Hospital Events   4/06 Admit   Consults:  PCCM   Procedures:    Significant  Diagnostic Tests:   CT Chest w/o 4/6 >> 16 mm nodule in the subpleural LUL, small pleural effusions and mild interstitial edema, atelectasis bilaterally, progressive collaterals in the chest wall associated with left brachiocephalic vein occlusion / collapse (may account for mild interval enlargement of mediastinal lymph nodes)  ECHO 4/8 >> LVEF 50-55%, no RWMA, grade I diastolic dysfunction, RV systolic normal, mild to moderate mitral valve regurgitation  Micro Data:  COVID 4/6 >> negative  MRSA PCR 4/6 >> negative   Antimicrobials:  Doxycycline 4/8 >>  Interim history/subjective:  No acute events.  Denies dyspnea. Stable on room air.  Asking for something to drink  Objective   Blood pressure 134/60, pulse (!) 59, temperature 98.3 F (36.8 C), temperature source Oral, resp. rate 18, height 5\' 4"  (1.626 m), weight 48.2 kg, SpO2 98 %.        Intake/Output Summary (Last 24 hours) at 02/08/2020 0816 Last data filed at 02/08/2020 0600 Gross per 24 hour  Intake 360 ml  Output 0 ml  Net 360 ml   Filed Weights   02/05/20 0548 02/06/20 0500 02/07/20 0441  Weight: 47.2 kg 48.6 kg 48.2 kg    Examination: Gen:   Chronically ill-appearing, thin HEENT:  EOMI, sclera anicteric Neck:     No masses; no thyromegaly Lungs:    Distant breath sounds, no wheeze CV:  Regular rate and rhythm; no murmurs Abd:      + bowel sounds; soft, non-tender; no palpable masses, no distension Ext:    No edema; adequate peripheral perfusion Skin:      Warm and dry; no rash Neuro: alert and oriented x 3 Psych: normal mood and affect  Resolved Hospital Problem list      Assessment & Plan:   LUL Pulmonary Nodule  Present on imaging in 07/2018, 46mm.  Now enlarged to 16 mm Originally scheduled for endobronchial ultrasound, navigational biopsy by Dr. Lamonte Sakai today but unfortunately she received her Lovenox yesterday Procedure has been rescheduled to Wednesday 4/14 at 10 AM Stop Lovenox as it is for  DVT prophylaxis and it is okay to hold Risk of procedure discussed with patient including hemorrhage, pneumothorax and she has agreed to go ahead Given compliance issues with other disease processes as outpatient we would prefer to do procedure before discharge.  COPD with Acute Exacerbation  Bronchiectasis  Reported COPD without baseline PFT's, mild airway thickening on CT but surprisingly little emphysema  Continue Brovana, Pulmicort Continue doxycycline will need outpatient management of COPD and PFTs Flutter valve while inpatient and will need to take home for use BID to ensure airway clearance  Left > Right Pleural Effusion Hx CHF   Diuresis per primary  team  Tobacco Abuse  Nicotine patch  Smoking cessation counseling   Akhila Mahnken MD  Pulmonary and Critical Care Please see [email or web address] for pager details.  02/08/2020, 8:16 AM

## 2020-02-09 ENCOUNTER — Telehealth: Payer: Self-pay | Admitting: Emergency Medicine

## 2020-02-09 LAB — BASIC METABOLIC PANEL
Anion gap: 12 (ref 5–15)
BUN: 92 mg/dL — ABNORMAL HIGH (ref 8–23)
CO2: 22 mmol/L (ref 22–32)
Calcium: 8.5 mg/dL — ABNORMAL LOW (ref 8.9–10.3)
Chloride: 103 mmol/L (ref 98–111)
Creatinine, Ser: 2.11 mg/dL — ABNORMAL HIGH (ref 0.44–1.00)
GFR calc Af Amer: 26 mL/min — ABNORMAL LOW (ref >60)
GFR calc non Af Amer: 22 mL/min — ABNORMAL LOW (ref >60)
Glucose, Bld: 89 mg/dL (ref 70–99)
Potassium: 4.8 mmol/L (ref 3.5–5.1)
Sodium: 137 mmol/L (ref 135–145)

## 2020-02-09 LAB — GLUCOSE, CAPILLARY
Glucose-Capillary: 103 mg/dL — ABNORMAL HIGH (ref 70–99)
Glucose-Capillary: 172 mg/dL — ABNORMAL HIGH (ref 70–99)
Glucose-Capillary: 126 mg/dL — ABNORMAL HIGH (ref 70–99)
Glucose-Capillary: 191 mg/dL — ABNORMAL HIGH (ref 70–99)

## 2020-02-09 LAB — CBC
HCT: 32.7 % — ABNORMAL LOW (ref 36.0–46.0)
Hemoglobin: 9.7 g/dL — ABNORMAL LOW (ref 12.0–15.0)
MCH: 26.5 pg (ref 26.0–34.0)
MCHC: 29.7 g/dL — ABNORMAL LOW (ref 30.0–36.0)
MCV: 89.3 fL (ref 80.0–100.0)
Platelets: 470 10*3/uL — ABNORMAL HIGH (ref 150–400)
RBC: 3.66 MIL/uL — ABNORMAL LOW (ref 3.87–5.11)
RDW: 16.9 % — ABNORMAL HIGH (ref 11.5–15.5)
WBC: 9.5 10*3/uL (ref 4.0–10.5)
nRBC: 0 % (ref 0.0–0.2)

## 2020-02-09 MED ORDER — BOOST / RESOURCE BREEZE PO LIQD CUSTOM
1.0000 | ORAL | Status: DC
  Administered 2020-02-12: 10:00:00 1 via ORAL

## 2020-02-09 MED ORDER — KATE FARMS STANDARD 1.4 PO LIQD
325.0000 mL | ORAL | Status: DC
  Administered 2020-02-09 – 2020-02-10 (×2): 325 mL via ORAL
  Filled 2020-02-09 (×4): qty 325

## 2020-02-09 NOTE — TOC Progression Note (Signed)
Transition of Care [facility]) - Progression Note    Patient Details  Name: [name] MRN: [id number] Date of Birth: Jan 13, 1946  Transition of Care [facility]) CM/SW Contact  Shann Lewellyn, Juliann Pulse, RN Phone Number: 02/09/2020, 12:34 PM  Clinical Narrative: Mendel Corning will start auth.      Expected Discharge Plan: Skilled Nursing Facility Barriers to Discharge: Continued Medical Work up  Expected Discharge Plan and Services Expected Discharge Plan: [place]   Discharge Planning Services: CM Consult   Living arrangements for the past 2 months: Single Family Home                                       Social Determinants of Health (SDOH) Interventions    Readmission Risk Interventions Readmission Risk Prevention Plan 02/05/2020 12/24/2019 11/23/2019  Transportation Screening Complete Complete Complete  PCP or Specialist Appt within 3-5 Days - Complete Complete  HRI or Home Care Consult - Complete Complete  Social Work Consult for [place] Planning/Counseling - Complete Complete  Palliative Care Screening - Not Applicable Not Applicable  Medication Review Press photographer) Complete Complete Complete  PCP or Specialist appointment within 3-5 days of discharge Complete - -  [place] or Home Care Consult Complete - -  SW Recovery Care/Counseling Consult Complete - -  Palliative Care Screening Not Applicable - -  Skilled Nursing Facility Complete - -  Some recent data might be hidden

## 2020-02-09 NOTE — Progress Notes (Signed)
Nutrition Follow-up  DOCUMENTATION CODES:   Non-severe (moderate) malnutrition in context of chronic illness, Underweight  INTERVENTION:  - will decrease Boost Breeze from TID to once/day, each supplement provides 250 kcal and 9 grams of protein. - will order [employer] once/day, each supplement provides 455 kcal and 20 grams protein. - will order Magic Cup with lunch meals, each supplement provides 290 kcal and 9 grams of protein. - continue to encourage PO intakes.    NUTRITION DIAGNOSIS:   Moderate Malnutrition related to chronic illness as evidenced by moderate fat depletion, moderate muscle depletion, mild muscle depletion. -revised  GOAL:   Patient will meet greater than or equal to 90% of their needs -unmet on average   MONITOR:   PO intake, Supplement acceptance, Labs, Weight trends  ASSESSMENT:   74 year old female with past medical history of anxiety, depression, asthma/COPD, active smoker, CAD, CKD, chronic pain, DM2 with neuropathy, fibromyalgia, HOH, HTN, Parkinson's disease, PVD, chronic diarrhea, recurrent upper respiratory infection, spinal stenosis, presented to ED for the 3rd straight day due to progressively worse dyspnea for the past few days, reports using her bronchodilators without significant results at home. CXR showed worsening interstitial opacities favoring edema and small left pleural effusion.  She ate 50% of breakfast and 50% of lunch on 4/11 (total of 488 kcal, 20 grams protein); 100% of breakfast on 4/12 (533 kcal, 12 grams protein). She has been accepting Boost Breeze ~25% of the time offered. Weight has been stable since admission on 4/7.   May need to increase estimated needs following biopsy results.   Per notes: - acute on chronic CHF and COPD exacerbations--lasix now on hold - AKI on stage 3 CKD - pulmonary nodule (16 mm) noted on CT chest--plan for biopsy today (4/13)   Labs reviewed; CBGs: 103 and 172 mg/dl, BUN: 92 mg/dl, creatinine:  2.11 mg/dl, Ca: 8.5 mg/dl, GFR: 22 ml/min. Medications reviewed; sliding scale novolog, daily multivitamin with minerals, 40 mg oral protonix BID, 40 mg deltasone/day, 100 mcg oral cyanocobalamin/day.      NUTRITION - FOCUSED PHYSICAL EXAM:    Most Recent Value  Orbital Region  Moderate depletion  Upper Arm Region  Moderate depletion  Thoracic and Lumbar Region  Moderate depletion  Buccal Region  Moderate depletion  Temple Region  Moderate depletion  Clavicle Bone Region  Moderate depletion  Clavicle and Acromion Bone Region  Moderate depletion  Scapular Bone Region  Unable to assess  Dorsal Hand  Moderate depletion  Patellar Region  Mild depletion  Anterior Thigh Region  Unable to assess  Posterior Calf Region  Mild depletion  Edema (RD Assessment)  None  Hair  Reviewed  Eyes  Reviewed  Mouth  Reviewed  Skin  Reviewed  Nails  Reviewed       Diet Order:   Diet Order            Diet NPO time specified Except for: Sips with Meds  Diet effective midnight        Diet Carb Modified Fluid consistency: Thin; Room service appropriate? Yes  Diet effective now              EDUCATION NEEDS:   No education needs have been identified at this time  Skin:  Skin Assessment: Reviewed RN Assessment  Last BM:  unknown  Height:   Ht Readings from Last 1 Encounters:  02/03/20 5\' 4"  (1.626 m)    Weight:   Wt Readings from Last 1 Encounters:  02/09/20  48.2 kg    Ideal Body Weight:     BMI:  Body mass index is 18.24 kg/m.  Estimated Nutritional Needs:   Kcal:  1450-1700  Protein:  73-85  Fluid:  >/= 1.4 L/day     Jarome Matin, MS, RD, LDN, CNSC Inpatient Clinical Dietitian RD pager # available in AMION  After hours/weekend pager # available in [facility]

## 2020-02-09 NOTE — Progress Notes (Signed)
Daughter called requesting to be contacted regarding all procedures for the pt. States the patient is forgetful. [place], [phone]

## 2020-02-09 NOTE — H&P (View-Only) (Signed)
NAME:  [name], MRN:  [id number], DOB:  [birth year], LOS: 6 ADMISSION DATE:  02/02/2020, CONSULTATION DATE:  02/04/20 REFERRING MD:  Dr. Tana Coast, CHIEF COMPLAINT:  Shortness of breath   Brief History   74 y/o F who was admitted on 4/6 with progressive shortness of breath.  CT imaging with enlarging left pulmonary nodule.   History of present illness   74 y/o F who presented to the ER on 4/6 with reports of progressive shortness of breath.   Of note, she was seen in the ER on 4/4, 4/5 and 4/6 for SOB. On 4/4 she reported she had run out of her home inhalers and was coughing more than usual.  CXR at that time showed unchanged LUL nodule with recommendation for non-emergent follow up.  She was treated with inhalers and discharged home.  She returned on 4/5 with ongoing SOB. CXR was unchanged.  She was treated with nebulized bronchodilators with improvement.  At discharge she was given an albuterol inhaler and 5 day course of prednisone.   The patient reports her primary care MD changed and she does not feel comfortable with her new one.  She indicates she ran out of all of her medications and has not been taking anything for at least two months.  She ran out of her inhalers approximately 2 weeks prior to admit (Combivent).  She states they "don't help anyway".  The patient reports she began smoking at age 75, smoked up to 3ppd, now smokes 1ppd.  States she has always been thin. Reports increasing SOB, cough that is largely non-productive. Denies fevers.   PCCM consulted for evaluation of nodule, dyspnea.   Past Medical History  COPD  OSA  Tobacco Abuse  Known Left Pulmonary Nodule - present in 07/2018 Asthma  DVT  Carotid Artery Occlusion  CAD / PVD HTN CHF - diastolic  Anemia  DM  CKD III  Parkinson's Disease  Anxiety  GERD  Graves Disease  Fibromyalgia  Chronic Pain Malnutrition   Significant Hospital Events   4/06 Admit   Consults:  PCCM   Procedures:    Significant  Diagnostic Tests:   CT Chest w/o 4/6 >> 16 mm nodule in the subpleural LUL, small pleural effusions and mild interstitial edema, atelectasis bilaterally, progressive collaterals in the chest wall associated with left brachiocephalic vein occlusion / collapse (may account for mild interval enlargement of mediastinal lymph nodes)  ECHO 4/8 >> LVEF 50-55%, no RWMA, grade I diastolic dysfunction, RV systolic normal, mild to moderate mitral valve regurgitation  Micro Data:  COVID 4/6 >> negative  MRSA PCR 4/6 >> negative   Antimicrobials:  Doxycycline 4/8 >>  Interim history/subjective:  No significant events.  Denies any dyspnea  Objective   Blood pressure (!) 134/55, pulse 75, temperature 98.4 F (36.9 C), temperature source Oral, resp. rate 16, height 5\' 4"  (1.626 m), weight 48.2 kg, SpO2 97 %.        Intake/Output Summary (Last 24 hours) at 02/09/2020 1743 Last data filed at 02/09/2020 1549 Gross per 24 hour  Intake 240 ml  Output --  Net 240 ml   Filed Weights   02/06/20 0500 02/07/20 0441 02/09/20 0500  Weight: 48.6 kg 48.2 kg 48.2 kg    Examination: Gen:   Chronically ill-appearing, thin HEENT:  EOMI, sclera anicteric Neck:     No masses; no thyromegaly Lungs:    Distant breath sounds, no wheeze CV:         Regular rate  and rhythm; no murmurs Abd:      + bowel sounds; soft, non-tender; no palpable masses, no distension Ext:    No edema; adequate peripheral perfusion Skin:      Warm and dry; no rash Neuro: alert and oriented x 3 Psych: normal mood and affect  Resolved Hospital Problem list      Assessment & Plan:   LUL Pulmonary Nodule  Present on imaging in 07/2018, 20mm.  Now enlarged to 16 mm Scheduled for endobronchial ultrasound, navigational biopsy by Dr. Lamonte Sakai Wednesday 4/14 at 10 AM Off lovenox. Keep NPO past midnight Risk of procedure discussed with patient including hemorrhage, pneumothorax and she has agreed to go ahead  COPD with Acute Exacerbation   Bronchiectasis  Reported COPD without baseline PFT's, mild airway thickening on CT but surprisingly little emphysema  Continue Brovana, Pulmicort Continue doxycycline will need outpatient management of COPD and PFTs Flutter valve while inpatient and will need to take home for use BID to ensure airway clearance  Left > Right Pleural Effusion Hx CHF   Diuresis per primary  team  Tobacco Abuse  Nicotine patch  Smoking cessation counseling   Masahiro Iglesia MD [facility] Please see [email or web address] for pager details.  02/09/2020, 5:43 PM

## 2020-02-09 NOTE — Progress Notes (Signed)
PROGRESS NOTE    [name]  IHK:[id number] DOB: [birth year] DOA: 02/02/2020 PCP: [name], MD   Brief Narrative: 74 year old with past medical history significant for anxiety, depression, asthma, COPD active smoker, CKD, fibromyalgia, spinal stenosis, history of DVT, CAD, Graves' disease, Parkinson disease, PVD, diabetes who presents with progressively worsening dyspnea for past few days prior to admission.  Patient reported using her bronchodilators without significant improvement.  Evaluation in the ED BNP 1300, chest x-ray showed worsening interstitial opacity favoring edema, small pleural effusion.  Patient was admitted for acute hypoxic respiratory failure secondary to acute on chronic diastolic heart failure and COPD exacerbation.  She was treated with IV Laxis.  Laxis has been on hold due to worsening renal function.  She was found to have a lung nodule, plan is to proceed with biopsy on 4/14   Assessment & Plan:   Principal Problem:   Acute respiratory failure with hypoxemia (HCC) Active Problems:   Diabetes mellitus with neurological manifestation (HCC)   HTN (hypertension)   Depression   Dyslipidemia   Gastroesophageal reflux disease without esophagitis   Pulmonary nodule, left   DNR (do not resuscitate)   Acute on chronic diastolic CHF (congestive heart failure) (HCC)   Vitamin B 12 deficiency   Tremor   SOB (shortness of breath)   Coronary artery calcification seen on CAT scan   1-Acute respiratory failure with hypoxemia likely secondary to acute on chronic diastolic heart failure and COPD exacerbation -Patient was placed on IV Lasix for diuresis, creatinine has increased.  Lasix has been on hold. -Creatinine has plateaued to 2.2. -Cardiology was consulted, seen by Dr. Radford Pax, felt severe COPD also contributing to symptoms. -Continue to monitor volume status. Negative 1.9 L.   2-AKI superimposed on CKD stage IIIb -Creatinine baseline 1.4--1.7  -Creatinine increased up to 2.4 due to Laxis -Creatinine decreased to 2.1 follow trend.  3-severe COPD exacerbation: Patient initially required NRB transiently and IV steroids and nebulizer treatment by EMS Continue with Pulmicort Brovana and flutter valve On scheduled nebulizer to every 6 hours. Treated with doxycycline.  4-Pulmonary nodule on CT chest: -CT chest showed 16 mm nodule in the subpleural left upper lobe worrisome for bronchogenic carcinoma. -Plan for another biopsy by EBUS tomorrow.   5-hyperkalemia: Received Lokelma: Resolved  6-diabetes type 2 with hyperglycemia, uncontrolled: Continue with sliding scale insulin  Hypertension: Continue with amlodipine and metoprolol  Depression: Continue with Cymbalta Tremor: History of Parkinson's disease, need to follow-up with neurology as an outpatient  B12 deficiency: Received intramuscular 1 dose, will schedule oral supplement. Protein calorie malnutrition will order Ensure.   Nutrition Problem: Moderate Malnutrition Etiology: chronic illness    Signs/Symptoms: moderate fat depletion, moderate muscle depletion, mild muscle depletion    Interventions: Boost Breeze, Magic cup, MVI, Other (Comment)([place])  Estimated body mass index is 18.24 kg/m as calculated from the following:   Height as of this encounter: 5\' 4"  (1.626 m).   Weight as of this encounter: 48.2 kg.   DVT prophylaxis: SCD Code Status: DNR Family Communication: care discussed with patient.  Disposition Plan:  Patient is from: Home Anticipated d/c date: in 2 days, awaiting lung bx Barriers to d/c or necessity for inpatient status: SNF in 2 days, awaiting lung biopsy and improvement of renal function.   Consultants:   Pulmonology   Cardiology   Procedures:     Antimicrobials:    Subjective: No new complaints, denies dyspnea.    Objective: Vitals:   02/08/20 2211 02/09/20  0423 02/09/20 0500 02/09/20 0722  BP: (!) 118/57 139/72     Pulse: 68 67    Resp: 18 18    Temp: 98 F (36.7 C) 98.3 F (36.8 C)    TempSrc: Oral Oral    SpO2: 98% 96%  95%  Weight:   48.2 kg   Height:       No intake or output data in the 24 hours ending 02/09/20 1421 Filed Weights   02/06/20 0500 02/07/20 0441 02/09/20 0500  Weight: 48.6 kg 48.2 kg 48.2 kg    Examination:  General exam: NAD Respiratory system: CTA Cardiovascular system: S 1, S 2 RRR. Gastrointestinal system: BS present, soft ,nt Central nervous system: Non focal.  Extremities: no edema Skin: no rashes   Data Reviewed: I have personally reviewed following labs and imaging studies  CBC: Recent Labs  Lab 02/03/20 0517 02/09/20 0526  WBC 4.9 9.5  HGB 8.2* 9.7*  HCT 27.6* 32.7*  MCV 91.1 89.3  PLT 329 076*   Basic Metabolic Panel: Recent Labs  Lab 02/05/20 0511 02/06/20 0532 02/07/20 0539 02/08/20 0510 02/09/20 0735  NA 140 137 137 133* 137  K 3.8 4.4 4.7 4.8 4.8  CL 103 101 104 99 103  CO2 19* 22 23 23 22   GLUCOSE 125* 133* 109* 106* 89  BUN 75* 89* 90* 89* 92*  CREATININE 2.31* 2.46* 2.16* 2.11* 2.11*  CALCIUM 8.1* 8.4* 8.4* 8.3* 8.5*   GFR: Estimated Creatinine Clearance: 17.8 mL/min (A) (by C-G formula based on SCr of 2.11 mg/dL (H)). Liver Function Tests: Recent Labs  Lab 02/03/20 0517  AST 28  ALT 25  ALKPHOS 131*  BILITOT 0.5  PROT 6.4*  ALBUMIN 3.5   No results for input(s): LIPASE, AMYLASE in the last 168 hours. No results for input(s): AMMONIA in the last 168 hours. Coagulation Profile: No results for input(s): INR, PROTIME in the last 168 hours. Cardiac Enzymes: No results for input(s): CKTOTAL, CKMB, CKMBINDEX, TROPONINI in the last 168 hours. BNP (last 3 results) No results for input(s): PROBNP in the last 8760 hours. HbA1C: No results for input(s): HGBA1C in the last 72 hours. CBG: Recent Labs  Lab 02/08/20 0810 02/08/20 1140 02/08/20 1723 02/09/20 0810 02/09/20 1154  GLUCAP 98 232* 199* 103* 172*   Lipid  Profile: No results for input(s): CHOL, HDL, LDLCALC, TRIG, CHOLHDL, LDLDIRECT in the last 72 hours. Thyroid Function Tests: No results for input(s): TSH, T4TOTAL, FREET4, T3FREE, THYROIDAB in the last 72 hours. Anemia Panel: No results for input(s): VITAMINB12, FOLATE, FERRITIN, TIBC, IRON, RETICCTPCT in the last 72 hours. Sepsis Labs: No results for input(s): PROCALCITON, LATICACIDVEN in the last 168 hours.  Recent Results (from the past 240 hour(s))  SARS CORONAVIRUS 2 (TAT 6-24 HRS) Nasopharyngeal Nasopharyngeal Swab     Status: None   Collection Time: 02/02/20  7:54 AM   Specimen: Nasopharyngeal Swab  Result Value Ref Range Status   SARS Coronavirus 2 NEGATIVE NEGATIVE Final    Comment: (NOTE) SARS-CoV-2 target nucleic acids are NOT DETECTED. The SARS-CoV-2 RNA is generally detectable in upper and lower respiratory specimens during the acute phase of infection. Negative results do not preclude SARS-CoV-2 infection, do not rule out co-infections with other pathogens, and should not be used as the sole basis for treatment or other patient management decisions. Negative results must be combined with clinical observations, patient history, and epidemiological information. The expected result is Negative. Fact Sheet for Patients: SugarRoll.be Fact Sheet for Healthcare Providers: https://www.woods-mathews.com/  This test is not yet approved or cleared by the [place] and  has been authorized for detection and/or diagnosis of SARS-CoV-2 by FDA under an Emergency Use Authorization (EUA). This EUA will remain  in effect (meaning this test can be used) for the duration of the COVID-19 declaration under Section 56 4(b)(1) of the Act, 21 U.S.C. section 360bbb-3(b)(1), unless the authorization is terminated or revoked sooner. Performed at [facility], [place] [address] [place], [place] [address]   MRSA PCR Screening     Status: None    Collection Time: 02/02/20  6:43 PM   Specimen: Nasal Mucosa; Nasopharyngeal  Result Value Ref Range Status   MRSA by PCR NEGATIVE NEGATIVE Final    Comment:        The GeneXpert MRSA Assay (FDA approved for NASAL specimens only), is one component of a comprehensive MRSA colonization surveillance program. It is not intended to diagnose MRSA infection nor to guide or monitor treatment for MRSA infections. Performed at [facility], [place] [address] [place], [place] [address]          Radiology Studies: No results found.      Scheduled Meds: . allopurinol  100 mg Oral Daily  . amLODipine  10 mg Oral Daily  . arformoterol  15 mcg Nebulization BID  . benzonatate  100 mg Oral TID  . budesonide (PULMICORT) nebulizer solution  0.5 mg Nebulization BID  . doxycycline  100 mg Oral Q12H  . DULoxetine  20 mg Oral Daily  . [START ON 02/10/2020] feeding supplement  1 Container Oral Q24H  . feeding supplement ([employer] STANDARD 1.4)  325 mL Oral Q24H  . insulin aspart  0-15 Units Subcutaneous TID WC  . ipratropium-albuterol  3 mL Nebulization BID  . metoprolol succinate  25 mg Oral Daily  . multivitamin with minerals  1 tablet Oral Daily  . nicotine  14 mg Transdermal Daily  . pantoprazole  40 mg Oral BID  . predniSONE  40 mg Oral QAC breakfast  . vitamin B-12  100 mcg Oral Daily   Continuous Infusions:   LOS: 6 days    Time spent: 35 minutes.     Elmarie Shiley, MD [facility]   If 7PM-7AM, please contact night-coverage [email or web address]  02/09/2020, 2:21 PM

## 2020-02-09 NOTE — Evaluation (Signed)
Occupational Therapy Evaluation Patient Details Name: [name] MRN: [id number] DOB: 1946-07-15 Today's Date: 02/09/2020    History of Present Illness 74 year old female with anxiety, depression, asthma, COPD, active smoker, carotid artery occlusion, CKD chronic pain of lower extremities, fibromyalgia, spinal stenosis of lumbar region, DJD, history of DVT, CAD, chronic diarrhea, GERD, Graves' disease, chronic headaches, hypertension, Parkinson's disease, PVD, diabetes mellitus presented due to progressively worsening dyspnea for the past few days.  Pt admitted for Acute respiratory failure with hypoxemia, likely secondary to acute on chronic diastolic CHF   Clinical Impression   Patient is a 74 year old female that lives alone in a single level home with 2 steps to enter. Patient is modified independent at baseline with self care. Currently patient has decreased activity tolerance, balance and safety requiring min guard assist for functional transfers and LB dressing. Will continue to follow.     Follow Up Recommendations  SNF    Equipment Recommendations  Other (comment)(defer to next venue)       Precautions / Restrictions Precautions Precautions: Fall Precaution Comments: monitor sats Restrictions Weight Bearing Restrictions: No      Mobility Bed Mobility               General bed mobility comments: pt in recliner on arrival  Transfers Overall transfer level: Needs assistance Equipment used: Rolling walker (2 wheeled) Transfers: Sit to/from Stand Sit to Stand: Min guard         General transfer comment: for safety    Balance Overall balance assessment: Needs assistance Sitting-balance support: Feet supported Sitting balance-Leahy Scale: Good     Standing balance support: Bilateral upper extremity supported;During functional activity Standing balance-Leahy Scale: Poor Standing balance comment: reliant on UE support                            ADL either performed or assessed with clinical judgement   ADL Overall ADL's : Needs assistance/impaired Eating/Feeding: Independent   Grooming: Set up;Sitting   Upper Body Bathing: Set up;Sitting   Lower Body Bathing: Min guard;Sitting/lateral leans;Sit to/from stand   Upper Body Dressing : Set up;Sitting   Lower Body Dressing: Min guard;Sitting/lateral leans;Sit to/from stand Lower Body Dressing Details (indicate cue type and reason): supervision to doff/don sock, min guard in standing for safety with balance Toilet Transfer: Min guard;Ambulation;Regular Toilet;RW Toilet Transfer Details (indicate cue type and reason): min guard for safety with balance, simulated with functional mobility Toileting- Clothing Manipulation and Hygiene: Min guard;Sitting/lateral lean;Sit to/from stand       Functional mobility during ADLs: Min guard;Rolling walker General ADL Comments: limited functional mobility and participation in self care due to decreased activity tolerance, min guard for safety     Vision Baseline Vision/History: Wears glasses Wears Glasses: At all times              Pertinent Vitals/Pain Pain Assessment: Faces Faces Pain Scale: Hurts little more Pain Location: back Pain Descriptors / Indicators: Aching;Grimacing;Guarding Pain Intervention(s): Monitored during session     Hand Dominance Right   Extremity/Trunk Assessment Upper Extremity Assessment Upper Extremity Assessment: Generalized weakness   Lower Extremity Assessment Lower Extremity Assessment: Defer to PT evaluation   Cervical / Trunk Assessment Cervical / Trunk Assessment: Kyphotic   Communication Communication Communication: HOH   Cognition Arousal/Alertness: Awake/alert Behavior During Therapy: WFL for tasks assessed/performed Overall Cognitive Status: Within Functional Limits for tasks assessed  General Comments: pt does report forgetfullness,  such as ordering her lunch today. able to problem solve locating kitchen number and placing order   General Comments  maintained 93-95% on room air with activity.             Home Living Family/patient expects to be discharged to:: Private residence Living Arrangements: Alone Available Help at Discharge: Available PRN/intermittently Type of Home: House Home Access: Stairs to enter [employer] of Steps: 2 Entrance Stairs-Rails: Right Home Layout: One level     Bathroom Shower/Tub: Occupational psychologist: [place] - single point;Walker - 2 wheels;Shower seat;Grab bars - tub/shower;Walker - 4 wheels          Prior Functioning/Environment Level of Independence: Independent with assistive device(s)        Comments: Furniture walker at home; uses rollator for community. Does not drive.        OT Problem List: Decreased strength;Decreased activity tolerance;Impaired balance (sitting and/or standing);Decreased safety awareness      OT Treatment/Interventions: Self-care/ADL training;Therapeutic exercise;Energy conservation;DME and/or AE instruction;Therapeutic activities;Patient/family education;Balance training    OT Goals(Current goals can be found in the care plan section) Acute Rehab OT Goals Patient Stated Goal: to have more energy OT Goal Formulation: With patient Time For Goal Achievement: 02/23/20 Potential to Achieve Goals: Good  OT Frequency: Min 2X/week    AM-PAC OT "6 Clicks" Daily Activity     Outcome Measure Help from another person eating meals?: None Help from another person taking care of personal grooming?: A Little Help from another person toileting, which includes using toliet, bedpan, or urinal?: A Little Help from another person bathing (including washing, rinsing, drying)?: A Little Help from another person to put on and taking off regular upper body clothing?: A Little Help from another person to  put on and taking off regular lower body clothing?: A Little 6 Click Score: 19   End of Session Equipment Utilized During Treatment: Rolling walker  Activity Tolerance: Patient limited by fatigue Patient left: in chair;with call bell/phone within reach;with chair alarm set  OT Visit Diagnosis: Unsteadiness on feet (R26.81);Muscle weakness (generalized) (M62.81)                Time: 4163-8453 OT Time Calculation (min): 18 min Charges:  OT General Charges $OT Visit: 1 Visit OT Evaluation $OT Eval Moderate Complexity: [place] OT Pager: [place] 02/09/2020, 12:20 PM

## 2020-02-09 NOTE — TOC Progression Note (Signed)
Transition of Care [facility]) - Progression Note    Patient Details  Name: [name] MRN: [id number] Date of Birth: 1946-10-24  Transition of Care [facility]) CM/SW Contact  Ifeoma Vallin, Juliann Pulse, RN Phone Number: 02/09/2020, 10:56 AM  Clinical Narrative: Patient/dtr [name] chose [place] following for covid(within 48hrs of d/c), & d/c summary.DNR in shadow chart for MD signature.    Expected Discharge Plan: Skilled Nursing Facility Barriers to Discharge: Continued Medical Work up  Expected Discharge Plan and Services Expected Discharge Plan: [place]   Discharge Planning Services: CM Consult   Living arrangements for the past 2 months: Single Family Home                                       Social Determinants of Health (SDOH) Interventions    Readmission Risk Interventions Readmission Risk Prevention Plan 02/05/2020 12/24/2019 11/23/2019  Transportation Screening Complete Complete Complete  PCP or Specialist Appt within 3-5 Days - Complete Complete  HRI or Home Care Consult - Complete Complete  Social Work Consult for [place] Planning/Counseling - Complete Complete  Palliative Care Screening - Not Applicable Not Applicable  Medication Review Press photographer) Complete Complete Complete  PCP or Specialist appointment within 3-5 days of discharge Complete - -  [place] or Home Care Consult Complete - -  SW Recovery Care/Counseling Consult Complete - -  Palliative Care Screening Not Applicable - -  Skilled Nursing Facility Complete - -  Some recent data might be hidden

## 2020-02-09 NOTE — Progress Notes (Signed)
NAME:  AMIRACLE NEISES, MRN:  [id number], DOB:  04-07-1946, LOS: 6 ADMISSION DATE:  02/02/2020, CONSULTATION DATE:  02/04/20 REFERRING MD:  Dr. Tana Coast, CHIEF COMPLAINT:  Shortness of breath   Brief History   74 y/o F who was admitted on 4/6 with progressive shortness of breath.  CT imaging with enlarging left pulmonary nodule.   History of present illness   74 y/o F who presented to the ER on 4/6 with reports of progressive shortness of breath.   Of note, she was seen in the ER on 4/4, 4/5 and 4/6 for SOB. On 4/4 she reported she had run out of her home inhalers and was coughing more than usual.  CXR at that time showed unchanged LUL nodule with recommendation for non-emergent follow up.  She was treated with inhalers and discharged home.  She returned on 4/5 with ongoing SOB. CXR was unchanged.  She was treated with nebulized bronchodilators with improvement.  At discharge she was given an albuterol inhaler and 5 day course of prednisone.   The patient reports her primary care MD changed and she does not feel comfortable with her new one.  She indicates she ran out of all of her medications and has not been taking anything for at least two months.  She ran out of her inhalers approximately 2 weeks prior to admit (Combivent).  She states they "don't help anyway".  The patient reports she began smoking at age 82, smoked up to 3ppd, now smokes 1ppd.  States she has always been thin. Reports increasing SOB, cough that is largely non-productive. Denies fevers.   PCCM consulted for evaluation of nodule, dyspnea.   Past Medical History  COPD  OSA  Tobacco Abuse  Known Left Pulmonary Nodule - present in 07/2018 Asthma  DVT  Carotid Artery Occlusion  CAD / PVD HTN CHF - diastolic  Anemia  DM  CKD III  Parkinson's Disease  Anxiety  GERD  Graves Disease  Fibromyalgia  Chronic Pain Malnutrition   Significant Hospital Events   4/06 Admit   Consults:  PCCM   Procedures:    Significant  Diagnostic Tests:   CT Chest w/o 4/6 >> 16 mm nodule in the subpleural LUL, small pleural effusions and mild interstitial edema, atelectasis bilaterally, progressive collaterals in the chest wall associated with left brachiocephalic vein occlusion / collapse (may account for mild interval enlargement of mediastinal lymph nodes)  ECHO 4/8 >> LVEF 50-55%, no RWMA, grade I diastolic dysfunction, RV systolic normal, mild to moderate mitral valve regurgitation  Micro Data:  COVID 4/6 >> negative  MRSA PCR 4/6 >> negative   Antimicrobials:  Doxycycline 4/8 >>  Interim history/subjective:  No significant events.  Denies any dyspnea  Objective   Blood pressure (!) 134/55, pulse 75, temperature 98.4 F (36.9 C), temperature source Oral, resp. rate 16, height 5\' 4"  (1.626 m), weight 48.2 kg, SpO2 97 %.        Intake/Output Summary (Last 24 hours) at 02/09/2020 1743 Last data filed at 02/09/2020 1549 Gross per 24 hour  Intake 240 ml  Output --  Net 240 ml   Filed Weights   02/06/20 0500 02/07/20 0441 02/09/20 0500  Weight: 48.6 kg 48.2 kg 48.2 kg    Examination: Gen:   Chronically ill-appearing, thin HEENT:  EOMI, sclera anicteric Neck:     No masses; no thyromegaly Lungs:    Distant breath sounds, no wheeze CV:         Regular rate  and rhythm; no murmurs Abd:      + bowel sounds; soft, non-tender; no palpable masses, no distension Ext:    No edema; adequate peripheral perfusion Skin:      Warm and dry; no rash Neuro: alert and oriented x 3 Psych: normal mood and affect  Resolved Hospital Problem list      Assessment & Plan:   LUL Pulmonary Nodule  Present on imaging in 07/2018, 53mm.  Now enlarged to 16 mm Scheduled for endobronchial ultrasound, navigational biopsy by Dr. Lamonte Sakai Wednesday 4/14 at 10 AM Off lovenox. Keep NPO past midnight Risk of procedure discussed with patient including hemorrhage, pneumothorax and she has agreed to go ahead  COPD with Acute Exacerbation   Bronchiectasis  Reported COPD without baseline PFT's, mild airway thickening on CT but surprisingly little emphysema  Continue Brovana, Pulmicort Continue doxycycline will need outpatient management of COPD and PFTs Flutter valve while inpatient and will need to take home for use BID to ensure airway clearance  Left > Right Pleural Effusion Hx CHF   Diuresis per primary  team  Tobacco Abuse  Nicotine patch  Smoking cessation counseling   Keaston Pile MD [facility] Please see [email or web address] for pager details.  02/09/2020, 5:43 PM

## 2020-02-09 NOTE — TOC Progression Note (Signed)
Transition of Care [facility]) - Progression Note    Patient Details  Name: [name] MRN: [id number] Date of Birth: 1946-02-15  Transition of Care [facility]) CM/SW Contact  Loye Vento, Juliann Pulse, RN Phone Number: 02/09/2020, 11:13 AM  Clinical Narrative:  OT ordered-will need to see for insurance auth.     Expected Discharge Plan: Skilled Nursing Facility Barriers to Discharge: Continued Medical Work up  Expected Discharge Plan and Services Expected Discharge Plan: [place]   Discharge Planning Services: CM Consult   Living arrangements for the past 2 months: Single Family Home                                       Social Determinants of Health (SDOH) Interventions    Readmission Risk Interventions Readmission Risk Prevention Plan 02/05/2020 12/24/2019 11/23/2019  Transportation Screening Complete Complete Complete  PCP or Specialist Appt within 3-5 Days - Complete Complete  HRI or Home Care Consult - Complete Complete  Social Work Consult for [place] Planning/Counseling - Complete Complete  Palliative Care Screening - Not Applicable Not Applicable  Medication Review Press photographer) Complete Complete Complete  PCP or Specialist appointment within 3-5 days of discharge Complete - -  [place] or Home Care Consult Complete - -  SW Recovery Care/Counseling Consult Complete - -  Palliative Care Screening Not Applicable - -  Skilled Nursing Facility Complete - -  Some recent data might be hidden

## 2020-02-09 NOTE — Telephone Encounter (Signed)
Thank you :)

## 2020-02-10 ENCOUNTER — Inpatient Hospital Stay (HOSPITAL_COMMUNITY): Payer: Medicare HMO

## 2020-02-10 ENCOUNTER — Inpatient Hospital Stay (HOSPITAL_COMMUNITY): Payer: Medicare HMO | Admitting: Anesthesiology

## 2020-02-10 ENCOUNTER — Other Ambulatory Visit: Payer: Self-pay

## 2020-02-10 ENCOUNTER — Encounter (HOSPITAL_COMMUNITY): Payer: Self-pay | Admitting: Internal Medicine

## 2020-02-10 ENCOUNTER — Encounter (HOSPITAL_COMMUNITY)
Admission: EM | Disposition: A | Discharge status: Skilled Nursing Facility | Payer: Self-pay | Source: Home / Self Care | Attending: Internal Medicine

## 2020-02-10 DIAGNOSIS — R911 Solitary pulmonary nodule: Secondary | ICD-10-CM | POA: Diagnosis not present

## 2020-02-10 DIAGNOSIS — E538 Deficiency of other specified B group vitamins: Secondary | ICD-10-CM

## 2020-02-10 DIAGNOSIS — I5033 Acute on chronic diastolic (congestive) heart failure: Secondary | ICD-10-CM | POA: Diagnosis not present

## 2020-02-10 DIAGNOSIS — R251 Tremor, unspecified: Secondary | ICD-10-CM

## 2020-02-10 DIAGNOSIS — J441 Chronic obstructive pulmonary disease with (acute) exacerbation: Secondary | ICD-10-CM | POA: Diagnosis not present

## 2020-02-10 DIAGNOSIS — J9601 Acute respiratory failure with hypoxia: Secondary | ICD-10-CM | POA: Diagnosis not present

## 2020-02-10 HISTORY — PX: VIDEO BRONCHOSCOPY WITH ENDOBRONCHIAL ULTRASOUND: SHX6177

## 2020-02-10 HISTORY — PX: VIDEO BRONCHOSCOPY WITH ENDOBRONCHIAL NAVIGATION: SHX6175

## 2020-02-10 LAB — BASIC METABOLIC PANEL
Anion gap: 8 (ref 5–15)
BUN: 77 mg/dL — ABNORMAL HIGH (ref 8–23)
CO2: 23 mmol/L (ref 22–32)
Calcium: 8.2 mg/dL — ABNORMAL LOW (ref 8.9–10.3)
Chloride: 107 mmol/L (ref 98–111)
Creatinine, Ser: 1.84 mg/dL — ABNORMAL HIGH (ref 0.44–1.00)
GFR calc Af Amer: 31 mL/min — ABNORMAL LOW (ref >60)
GFR calc non Af Amer: 27 mL/min — ABNORMAL LOW (ref >60)
Glucose, Bld: 90 mg/dL (ref 70–99)
Potassium: 4.4 mmol/L (ref 3.5–5.1)
Sodium: 138 mmol/L (ref 135–145)

## 2020-02-10 LAB — GLUCOSE, CAPILLARY
Glucose-Capillary: 96 mg/dL (ref 70–99)
Glucose-Capillary: 93 mg/dL (ref 70–99)
Glucose-Capillary: 115 mg/dL — ABNORMAL HIGH (ref 70–99)
Glucose-Capillary: 151 mg/dL — ABNORMAL HIGH (ref 70–99)
Glucose-Capillary: 203 mg/dL — ABNORMAL HIGH (ref 70–99)
Glucose-Capillary: 169 mg/dL — ABNORMAL HIGH (ref 70–99)

## 2020-02-10 SURGERY — BRONCHOSCOPY, WITH EBUS
Anesthesia: General | Site: Chest

## 2020-02-10 MED ORDER — LIDOCAINE 2% (20 MG/ML) 5 ML SYRINGE
INTRAMUSCULAR | Status: AC
  Filled 2020-02-10: qty 5

## 2020-02-10 MED ORDER — PROMETHAZINE HCL 25 MG/ML IJ SOLN: 6.2500 mg | INTRAMUSCULAR | Status: DC | PRN

## 2020-02-10 MED ORDER — DEXAMETHASONE SODIUM PHOSPHATE 10 MG/ML IJ SOLN
INTRAMUSCULAR | Status: AC
  Filled 2020-02-10: qty 1

## 2020-02-10 MED ORDER — ESMOLOL HCL 100 MG/10ML IV SOLN
INTRAVENOUS | Status: AC
  Filled 2020-02-10: qty 10

## 2020-02-10 MED ORDER — ROCURONIUM BROMIDE 10 MG/ML (PF) SYRINGE
PREFILLED_SYRINGE | INTRAVENOUS | Status: AC
  Filled 2020-02-10: qty 10

## 2020-02-10 MED ORDER — EPINEPHRINE PF 1 MG/ML IJ SOLN
INTRAMUSCULAR | Status: AC
  Filled 2020-02-10: qty 1

## 2020-02-10 MED ORDER — PHENYLEPHRINE HCL-NACL 10-0.9 MG/250ML-% IV SOLN
INTRAVENOUS | Status: DC | PRN
  Administered 2020-02-10: 20 ug/min via INTRAVENOUS

## 2020-02-10 MED ORDER — EPHEDRINE SULFATE-NACL 50-0.9 MG/10ML-% IV SOSY
PREFILLED_SYRINGE | INTRAVENOUS | Status: DC | PRN
  Administered 2020-02-10: 10 mg via INTRAVENOUS

## 2020-02-10 MED ORDER — FENTANYL CITRATE (PF) 250 MCG/5ML IJ SOLN
INTRAMUSCULAR | Status: DC | PRN
  Administered 2020-02-10: 50 ug via INTRAVENOUS

## 2020-02-10 MED ORDER — ROCURONIUM BROMIDE 10 MG/ML (PF) SYRINGE
PREFILLED_SYRINGE | INTRAVENOUS | Status: DC | PRN
  Administered 2020-02-10: 60 mg via INTRAVENOUS
  Administered 2020-02-10 (×2): 20 mg via INTRAVENOUS

## 2020-02-10 MED ORDER — ONDANSETRON HCL 4 MG/2ML IJ SOLN
INTRAMUSCULAR | Status: AC
  Filled 2020-02-10: qty 2

## 2020-02-10 MED ORDER — LACTATED RINGERS IV SOLN: INTRAVENOUS | Status: DC | PRN

## 2020-02-10 MED ORDER — PROPOFOL 10 MG/ML IV BOLUS
INTRAVENOUS | Status: AC
  Filled 2020-02-10: qty 20

## 2020-02-10 MED ORDER — ONDANSETRON HCL 4 MG/2ML IJ SOLN
INTRAMUSCULAR | Status: DC | PRN
  Administered 2020-02-10: 4 mg via INTRAVENOUS

## 2020-02-10 MED ORDER — PROPOFOL 10 MG/ML IV BOLUS
INTRAVENOUS | Status: DC | PRN
  Administered 2020-02-10: 30 mg via INTRAVENOUS
  Administered 2020-02-10: 50 mg via INTRAVENOUS
  Administered 2020-02-10: 120 mg via INTRAVENOUS

## 2020-02-10 MED ORDER — ESMOLOL HCL 100 MG/10ML IV SOLN
INTRAVENOUS | Status: DC | PRN
  Administered 2020-02-10: 20 mg via INTRAVENOUS

## 2020-02-10 MED ORDER — FENTANYL CITRATE (PF) 100 MCG/2ML IJ SOLN: 25.0000 ug | INTRAMUSCULAR | Status: DC | PRN

## 2020-02-10 MED ORDER — 0.9 % SODIUM CHLORIDE (POUR BTL) OPTIME
TOPICAL | Status: DC | PRN
  Administered 2020-02-10: 12:00:00 1000 mL

## 2020-02-10 MED ORDER — SUGAMMADEX SODIUM 200 MG/2ML IV SOLN
INTRAVENOUS | Status: DC | PRN
  Administered 2020-02-10: 100 mg via INTRAVENOUS

## 2020-02-10 MED ORDER — EPHEDRINE 5 MG/ML INJ
INTRAVENOUS | Status: AC
  Filled 2020-02-10: qty 10

## 2020-02-10 MED ORDER — FENTANYL CITRATE (PF) 250 MCG/5ML IJ SOLN
INTRAMUSCULAR | Status: AC
  Filled 2020-02-10: qty 5

## 2020-02-10 MED ORDER — LACTATED RINGERS IV SOLN: INTRAVENOUS | Status: DC

## 2020-02-10 MED ORDER — LIDOCAINE 2% (20 MG/ML) 5 ML SYRINGE
INTRAMUSCULAR | Status: DC | PRN
  Administered 2020-02-10: 80 mg via INTRAVENOUS

## 2020-02-10 MED ORDER — DEXAMETHASONE SODIUM PHOSPHATE 10 MG/ML IJ SOLN
INTRAMUSCULAR | Status: DC | PRN
  Administered 2020-02-10: 4 mg via INTRAVENOUS

## 2020-02-10 SURGICAL SUPPLY — 39 items
ADAPTER VALVE BIOPSY EBUS (MISCELLANEOUS) IMPLANT
ADPTR VALVE BIOPSY EBUS (MISCELLANEOUS)
BRUSH CYTOL CELLEBRITY 1.5X140 (MISCELLANEOUS) IMPLANT
BRUSH SUPERTRAX NDL-TIP CYTO (INSTRUMENTS) ×1 IMPLANT
CANISTER SUCT 3000ML PPV (MISCELLANEOUS) ×2 IMPLANT
CNTNR URN SCR LID CUP LEK RST (MISCELLANEOUS) ×1 IMPLANT
CONT SPEC 4OZ STRL OR WHT (MISCELLANEOUS) ×2
COVER BACK TABLE 60X90IN (DRAPES) ×2 IMPLANT
FORCEPS BIOP RJ4 1.8 (CUTTING FORCEPS) IMPLANT
FORCEPS BIOP SUPERTRX PREMAR (INSTRUMENTS) ×1 IMPLANT
GAUZE SPONGE 4X4 12PLY STRL (GAUZE/BANDAGES/DRESSINGS) ×2 IMPLANT
GLOVE BIO SURGEON STRL SZ7.5 (GLOVE) ×2 IMPLANT
GOWN STRL REUS W/ TWL LRG LVL3 (GOWN DISPOSABLE) ×1 IMPLANT
GOWN STRL REUS W/TWL LRG LVL3 (GOWN DISPOSABLE) ×2
KIT CLEAN ENDO COMPLIANCE (KITS) ×4 IMPLANT
KIT ILLUMISITE 180 PROCEDURE (KITS) ×1 IMPLANT
KIT TURNOVER KIT B (KITS) ×2 IMPLANT
MARKER SKIN DUAL TIP RULER LAB (MISCELLANEOUS) ×2 IMPLANT
NDL ASPIRATION VIZISHOT 19G (NEEDLE) IMPLANT
NDL ASPIRATION VIZISHOT 21G (NEEDLE) IMPLANT
NDL SUPERTRX PREMARK BIOPSY (NEEDLE) IMPLANT
NEEDLE ASPIRATION VIZISHOT 19G (NEEDLE) ×2 IMPLANT
NEEDLE ASPIRATION VIZISHOT 21G (NEEDLE) IMPLANT
NEEDLE SUPERTRX PREMARK BIOPSY (NEEDLE) ×2 IMPLANT
NS IRRIG 1000ML POUR BTL (IV SOLUTION) ×2 IMPLANT
OIL SILICONE PENTAX (PARTS (SERVICE/REPAIRS)) ×2 IMPLANT
PAD ARMBOARD 7.5X6 YLW CONV (MISCELLANEOUS) ×4 IMPLANT
SYR 20ML ECCENTRIC (SYRINGE) ×4 IMPLANT
SYR 20ML LL LF (SYRINGE) ×4 IMPLANT
SYR 50ML SLIP (SYRINGE) IMPLANT
SYR 5ML LUER SLIP (SYRINGE) ×2 IMPLANT
TOWEL GREEN STERILE FF (TOWEL DISPOSABLE) ×2 IMPLANT
TRAP SPECIMEN MUCOUS 40CC (MISCELLANEOUS) IMPLANT
TUBE CONNECTING 20X1/4 (TUBING) ×4 IMPLANT
UNDERPAD 30X30 (UNDERPADS AND DIAPERS) ×2 IMPLANT
VALVE BIOPSY  SINGLE USE (MISCELLANEOUS) ×2
VALVE BIOPSY SINGLE USE (MISCELLANEOUS) ×1 IMPLANT
VALVE SUCTION BRONCHIO DISP (MISCELLANEOUS) ×2 IMPLANT
WATER STERILE IRR 1000ML POUR (IV SOLUTION) ×2 IMPLANT

## 2020-02-10 NOTE — Progress Notes (Addendum)
Dr. Lamonte Sakai called and updated about radiologist report of X-Ray and said she was good to go back to room at [facility].

## 2020-02-10 NOTE — Anesthesia Preprocedure Evaluation (Signed)
Anesthesia Evaluation  Patient identified by MRN, date of birth, ID band Patient awake    Reviewed: Allergy & Precautions, NPO status , Patient's Chart, lab work & pertinent test results  Airway Mallampati: II  TM Distance: >3 FB Neck ROM: Full    Dental no notable dental hx.    Pulmonary asthma , sleep apnea , COPD, Current Smoker,    Pulmonary exam normal breath sounds clear to auscultation       Cardiovascular hypertension, + Peripheral Vascular Disease  Normal cardiovascular exam Rhythm:Regular Rate:Normal     Neuro/Psych negative neurological ROS  negative psych ROS   GI/Hepatic Neg liver ROS, GERD  ,  Endo/Other  diabetesHyperthyroidism   Renal/GU Renal InsufficiencyRenal disease  negative genitourinary   Musculoskeletal  (+) Arthritis ,   Abdominal   Peds negative pediatric ROS (+)  Hematology negative hematology ROS (+) anemia ,   Anesthesia Other Findings   Reproductive/Obstetrics negative OB ROS                             Anesthesia Physical Anesthesia Plan  ASA: IV  Anesthesia Plan: General   Post-op Pain Management:    Induction: Intravenous  PONV Risk Score and Plan: 2 and Ondansetron, Dexamethasone and Treatment may vary due to age or medical condition  Airway Management Planned: Oral ETT  Additional Equipment:   Intra-op Plan:   Post-operative Plan: Extubation in OR  Informed Consent: I have reviewed the patients History and Physical, chart, labs and discussed the procedure including the risks, benefits and alternatives for the proposed anesthesia with the patient or authorized representative who has indicated his/her understanding and acceptance.     Dental advisory given  Plan Discussed with: CRNA and Surgeon  Anesthesia Plan Comments:         Anesthesia Quick Evaluation

## 2020-02-10 NOTE — Anesthesia Postprocedure Evaluation (Signed)
Anesthesia Post Note  Patient: [name]  Procedure(s) Performed: VIDEO BRONCHOSCOPY WITH ENDOBRONCHIAL ULTRASOUND (N/A Chest) Video Bronchoscopy With Endobronchial Navigation (N/A Chest)     Patient location during evaluation: PACU Anesthesia Type: General Level of consciousness: awake and alert Pain management: pain level controlled Vital Signs Assessment: post-procedure vital signs reviewed and stable Respiratory status: spontaneous breathing, nonlabored ventilation, respiratory function stable and patient connected to nasal cannula oxygen Cardiovascular status: blood pressure returned to baseline and stable Postop Assessment: no apparent nausea or vomiting Anesthetic complications: no    Last Vitals:  Vitals:   02/10/20 1207 02/10/20 1220  BP: (!) 141/52 (!) 134/51  Pulse: 77 74  Resp: 13 20  Temp: 37.1 C   SpO2: 100% 92%    Last Pain:  Vitals:   02/10/20 1220  TempSrc:   PainSc: 0-No pain                 [name]

## 2020-02-10 NOTE — Progress Notes (Signed)
PCCM Interval Note  Notified of suspected small medial apical PTX on CXR.  Pt is clinically stable, comfortable.   Will perform repeat CXR at [facility]  in 3 hours, follow for stability.   Baltazar Apo, MD, PhD 02/10/2020, 1:14 PM [facility] [phone] or if no answer [phone]

## 2020-02-10 NOTE — Anesthesia Procedure Notes (Signed)
Procedure Name: Intubation Date/Time: 02/10/2020 9:59 AM Performed by: Renato Shin, CRNA Pre-anesthesia Checklist: Patient identified, Emergency Drugs available, Suction available and Patient being monitored Patient Re-evaluated:Patient Re-evaluated prior to induction Oxygen Delivery Method: Circle system utilized Preoxygenation: Pre-oxygenation with 100% oxygen Induction Type: IV induction Ventilation: Mask ventilation without difficulty Laryngoscope Size: Miller Grade View: Grade I Tube type: Oral Tube size: 8.0 mm Number of attempts: 1 Airway Equipment and Method: Stylet and Oral airway Placement Confirmation: ETT inserted through vocal cords under direct vision,  positive ETCO2 and breath sounds checked- equal and bilateral Secured at: 21 cm Tube secured with: Tape Dental Injury: Teeth and Oropharynx as per pre-operative assessment

## 2020-02-10 NOTE — Progress Notes (Signed)
PROGRESS NOTE    [name]  YJE:[id number] DOB: Nov 27, 1945 DOA: 02/02/2020 PCP: [name], MD    Chief Complaint  Patient presents with   Shortness of Breath    Brief Narrative:  [name] is a 74 y.o. female with medical history significant of anxiety, depression, asthma/COPD, active smoker, carotid artery occlusion, chronic kidney disease, chronic pain of lower extremities and feet, fibromyalgia, spinal stenosis of lumbar region, degenerative disc disease, history of DVT, CAD, chronic diarrhea, GERD, Graves' disease, chronic headaches, hypertensions, Parkinson's disease, peripheral vascular disease, PUD, recurrent URIs, sleep apnea not on CPAP, thrombophlebitis, type 2 diabetes with neuropathy, vitamin B12 deficiency who is coming to the emergency department for the third straight day due to progressively worse dyspnea for the past few days.  She has been using her bronchodilators without significant results at home.  Patient states that she woke up today around 3:40 AM with significant dyspnea.  EMS was called.  They gave her a DuoNeb and 125 mg of IV Solu-Medrol.  She was placed on a nonrebreather oxygen mask transiently.  She denies fever, chills, sore throat or rhinorrhea.  She has been having a mostly dry cough.  She denies hemoptysis, nausea or emesis, chest pain, palpitations, dizziness, diaphoresis, PND, orthopnea or pitting edema of the lower extremities.  Denies abdominal pain, diarrhea, constipation, melena or hematochezia.  No dysuria, frequency or hematuria.  ED Course: Initial vital signs temperature 97.9 F, pulse 96, respiration 24, blood pressure 168/84 mmHg and O2 sat 100% 12 LPM and on NRB.  She was most recently 99% on 2 LPM via nasal cannula.  In the ED, she received magnesium sulfate and bronchodilators.  Lab work shows a white count of 9.5, hemoglobin 8.3 g/dL and platelets 311.  BMP Sodium of 141, potassium 4.1, chloride 114 and CO2 16 mmol/L.   Glucose 124, BUN 39, creatinine 1.54 and calcium 8.4 mg/dL.  ABG shows a pH of 7.27, PCO2 of 37.6 and PO2 of 39.6 mmHg.  Bicarbonate was 16.9 and acid base deficit was 8.8 mmol/L.  BNP was 1305.3 pg/mL.  Magnesium is 2.5 and phosphorus 4.5 mg/dL.  Chest radiograph shows worsening interstitial opacities favoring edema.  There was small left pleural effusion.    Assessment & Plan:   Principal Problem:   Pulmonary nodule, left Active Problems:   Diabetes mellitus with neurological manifestation (HCC)   HTN (hypertension)   Depression   Dyslipidemia   Gastroesophageal reflux disease without esophagitis   Acute on chronic diastolic CHF (congestive heart failure) (HCC)   Acute respiratory failure with hypoxemia (HCC)   Vitamin B 12 deficiency   Tremor   SOB (shortness of breath)   Coronary artery calcification seen on CAT scan  #1 acute respiratory failure with hypoxemia secondary to acute on chronic diastolic CHF and COPD exacerbation Patient presenting with acute respiratory failure with hypoxemia felt likely secondary to her chronic diastolic heart failure exacerbation and COPD exacerbation.  Patient placed on IV Lasix with good diuresis.  Patient seen in consultation by cardiology and due to creatinine plateauing at 2.2 diuretics held.  It was felt per cardiology that severe COPD likely contributing to her symptoms.  Patient with some clinical improvement.  Will need outpatient follow-up with cardiology.  Patient currently on steroids and doxycycline.  Continue Brovana, Pulmicort.  Pulmonary following.  2.  Left upper lobe pulmonary nodule Pulmonary nodule noted to have enlarged to 16 mm from 7 mm on 07/2018.  Patient status post endobronchial ultrasound,  navigational biopsy per Dr. Lamonte Sakai today 02/10/2020.  Lovenox was held.  Pulmonary following.  3.  Acute COPD exacerbation with bronchiectasis Patient with history of COPD.  Mild airway thickening noted on CT per pulmonary.  Continue  doxycycline, Brovana, Pulmicort, duo nebs.  Will need outpatient follow-up with pulmonary for COPD management and PFTs.  Continue flutter valve.  Pulmonary following.  4.  Tobacco abuse Tobacco cessation.  Continue nicotine patch.  5.  Hyperkalemia Status post Lokelma.  Resolved.  6.  Hypertension Continue Norvasc and metoprolol.  7.  Depression Stable.  Continue Cymbalta.  8.  Tremors/history of Parkinson's disease Outpatient follow-up with neurology.  9.  Vitamin B12 deficiency Status post IM dose.  Continue oral vitamin B12 supplementation.  10.  Moderate protein calorie malnutrition Continue nutritional supplementation.  11.  Well-controlled type 2 diabetes with hyperglycemia Hemoglobin A1c 5.6.  CBG of 93 this morning.  Continue sliding scale insulin.       DVT prophylaxis: SCDs Code Status: DNR Family Communication: Updated patient.  No family at bedside. Disposition:   Status is: Inpatient    Dispo: The patient is from: Home              Anticipated d/c is to: SNF              Anticipated d/c date is: 02/12/2020              Patient currently just came from bronchoscopy.  Likely home with continued clinical improvement and when cleared by pulmonary.        Consultants:   Cardiology: Dr. Fransico Him 02/04/2020  Pulmonary: Dr. Vaughan Browner  Procedures:   CT chest 02/02/2020  2D echo 02/04/2020  Bronchoscopy with electromagnetic navigation endobronchial ultrasound 02/10/2020 per Dr. Lamonte Sakai  Antimicrobials:   Doxycycline 02/04/2020     Subjective: Patient sitting up in bed eating crackers.  Patient just returning from bronchoscopy.  Denies any chest pain.  Feels shortness of breath is improving.  Objective: Vitals:   02/10/20 1207 02/10/20 1220 02/10/20 1250 02/10/20 1339  BP: (!) 141/52 (!) 134/51 (!) 132/56 133/60  Pulse: 77 74 74 76  Resp: 13 20 19 14   Temp: 98.8 F (37.1 C)  98.8 F (37.1 C) 98.5 F (36.9 C)  TempSrc:    Oral  SpO2: 100% 92%  94% 96%  Weight:      Height:        Intake/Output Summary (Last 24 hours) at 02/10/2020 1718 Last data filed at 02/10/2020 1530 Gross per 24 hour  Intake 1400 ml  Output 310 ml  Net 1090 ml   Filed Weights   02/07/20 0441 02/09/20 0500 02/10/20 0900  Weight: 48.2 kg 48.2 kg 48.2 kg    Examination:  General exam: Tremors. Respiratory system: Decreased breath sounds in the bases.  Some historian expiratory wheezing.  No crackles.  Speaking in full sentences.  Normal respiratory effort.   Cardiovascular system: S1 & S2 heard, RRR. No JVD, murmurs, rubs, gallops or clicks. No pedal edema. Gastrointestinal system: Abdomen is nondistended, soft and nontender. No organomegaly or masses felt. Normal bowel sounds heard. Central nervous system: Alert and oriented. No focal neurological deficits. Extremities: Symmetric 5 x 5 power. Skin: No rashes, lesions or ulcers Psychiatry: Judgement and insight appear normal. Mood & affect appropriate.     Data Reviewed: I have personally reviewed following labs and imaging studies  CBC: Recent Labs  Lab 02/09/20 0526  WBC 9.5  HGB 9.7*  HCT 32.7*  MCV 89.3  PLT 470*    Basic Metabolic Panel: Recent Labs  Lab 02/06/20 0532 02/07/20 0539 02/08/20 0510 02/09/20 0735 02/10/20 0511  NA 137 137 133* 137 138  K 4.4 4.7 4.8 4.8 4.4  CL 101 104 99 103 107  CO2 22 23 23 22 23   GLUCOSE 133* 109* 106* 89 90  BUN 89* 90* 89* 92* 77*  CREATININE 2.46* 2.16* 2.11* 2.11* 1.84*  CALCIUM 8.4* 8.4* 8.3* 8.5* 8.2*    GFR: Estimated Creatinine Clearance: 20.4 mL/min (A) (by C-G formula based on SCr of 1.84 mg/dL (H)).  Liver Function Tests: No results for input(s): AST, ALT, ALKPHOS, BILITOT, PROT, ALBUMIN in the last 168 hours.  CBG: Recent Labs  Lab 02/10/20 0720 02/10/20 0924 02/10/20 1207 02/10/20 1410 02/10/20 1627  GLUCAP 96 93 115* 151* 203*     Recent Results (from the past 240 hour(s))  SARS CORONAVIRUS 2 (TAT 6-24 HRS)  Nasopharyngeal Nasopharyngeal Swab     Status: None   Collection Time: 02/02/20  7:54 AM   Specimen: Nasopharyngeal Swab  Result Value Ref Range Status   SARS Coronavirus 2 NEGATIVE NEGATIVE Final    Comment: (NOTE) SARS-CoV-2 target nucleic acids are NOT DETECTED. The SARS-CoV-2 RNA is generally detectable in upper and lower respiratory specimens during the acute phase of infection. Negative results do not preclude SARS-CoV-2 infection, do not rule out co-infections with other pathogens, and should not be used as the sole basis for treatment or other patient management decisions. Negative results must be combined with clinical observations, patient history, and epidemiological information. The expected result is Negative. Fact Sheet for Patients: SugarRoll.be Fact Sheet for Healthcare Providers: https://www.woods-mathews.com/ This test is not yet approved or cleared by the [place] FDA and  has been authorized for detection and/or diagnosis of SARS-CoV-2 by FDA under an Emergency Use Authorization (EUA). This EUA will remain  in effect (meaning this test can be used) for the duration of the COVID-19 declaration under Section 56 4(b)(1) of the Act, 21 U.S.C. section 360bbb-3(b)(1), unless the authorization is terminated or revoked sooner. Performed at [facility], [place] [address], [place], [place] [address]   MRSA PCR Screening     Status: None   Collection Time: 02/02/20  6:43 PM   Specimen: Nasal Mucosa; Nasopharyngeal  Result Value Ref Range Status   MRSA by PCR NEGATIVE NEGATIVE Final    Comment:        The GeneXpert MRSA Assay (FDA approved for NASAL specimens only), is one component of a comprehensive MRSA colonization surveillance program. It is not intended to diagnose MRSA infection nor to guide or monitor treatment for MRSA infections. Performed at [facility], [place] [address]  [place],  [address]          Radiology Studies: DG Chest Port 1 View  Result Date: 02/10/2020 CLINICAL DATA:  Pneumothorax EXAM: PORTABLE CHEST 1 VIEW COMPARISON:  Radiograph 02/10/2020, CT chest 02/02/2020 FINDINGS: Chronic emphysematous and bronchitic features with coarsened interstitial changes compatible with history of COPD. Streaky bibasilar areas of opacity, likely atelectasis. A small medial left apical pneumothorax is less well visualized on this examination, possibly resolved. Small left apical nodule is again seen. No visible effusion. Borderline cardiomegaly is similar to prior. No acute osseous or soft tissue abnormality. Degenerative changes are present in the imaged spine and shoulders. Telemetry leads overlie the chest. IMPRESSION: 1. A small medial left apical pneumothorax is less well visualized on this examination, possibly  resolved. 2. Redemonstration of a medial left apical lung nodule seen on comparison CT. 3. Chronic emphysematous and bronchitic features with coarsened interstitial changes. Basilar atelectasis. Electronically Signed   By: Lovena Le M.D.   On: 02/10/2020 17:08   DG Chest Port 1 View  Result Date: 02/10/2020 CLINICAL DATA:  Post bronchoscopy with biopsy today EXAM: PORTABLE CHEST 1 VIEW COMPARISON:  Portable exam 1233 hours compared to 02/02/2020 Correlation CT chest 02/02/2020 FINDINGS: Borderline enlargement of cardiac silhouette. Mediastinal contours and pulmonary vascularity normal. Atherosclerotic calcification aorta. Emphysematous and bronchitic changes consistent with COPD. Mild bibasilar atelectasis. Improved infiltrates/edema since previous exam. Tiny medial LEFT apical pneumothorax. No pleural effusions. LEFT upper lobe nodule identified on prior CT exam is poorly seen radiographically at the medial LEFT upper lobe. IMPRESSION: Tiny medial LEFT apical pneumothorax post bronchoscopy and biopsy. Underlying COPD changes with bibasilar atelectasis.  Findings called to Kathlee Nations RN in PACU on 02/10/2020 at 1303 hours. Electronically Signed   By: Lavonia Dana M.D.   On: 02/10/2020 13:05   DG C-ARM BRONCHOSCOPY  Result Date: 02/10/2020 C-ARM BRONCHOSCOPY: Fluoroscopy was utilized by the requesting physician.  No radiographic interpretation.        Scheduled Meds:  allopurinol  100 mg Oral Daily   amLODipine  10 mg Oral Daily   arformoterol  15 mcg Nebulization BID   benzonatate  100 mg Oral TID   budesonide (PULMICORT) nebulizer solution  0.5 mg Nebulization BID   doxycycline  100 mg Oral Q12H   DULoxetine  20 mg Oral Daily   feeding supplement  1 Container Oral Q24H   feeding supplement (KATE FARMS STANDARD 1.4)  325 mL Oral Q24H   insulin aspart  0-15 Units Subcutaneous TID WC   ipratropium-albuterol  3 mL Nebulization BID   metoprolol succinate  25 mg Oral Daily   multivitamin with minerals  1 tablet Oral Daily   nicotine  14 mg Transdermal Daily   pantoprazole  40 mg Oral BID   predniSONE  40 mg Oral QAC breakfast   vitamin B-12  100 mcg Oral Daily   Continuous Infusions:  lactated ringers 10 mL/hr at 02/10/20 0904     LOS: 7 days    Time spent: 40 minutes    Irine Seal, MD [facility]   To contact the attending provider between 7A-7P or the covering provider during after hours 7P-7A, please log into the web site [email or web address] and access using universal [place] password for that web site. If you do not have the password, please call the hospital operator.  02/10/2020, 5:18 PM

## 2020-02-10 NOTE — Progress Notes (Signed)
PCCM interval note  Patient reexamined at [facility] She is comfortable, on room air with no distress Repeat chest x-ray 3:15 pm reviewed The pneumothorax is not obvious on my read. The radiologist's interpretation is pending  Okay to continue observation We will place her on 4 L supplemental oxygen to help with resorption Repeat chest x-ray tomorrow morning or sooner if there is any respiratory deterioration.  Marshell Garfinkel MD [facility] Please see [email or web address] for pager details.  02/10/2020, 5:08 PM

## 2020-02-10 NOTE — Interval H&P Note (Signed)
PCCM Interval Note  Pt presents today for further eval LUL nodule and mediastinal LAD. Admitted with AE-COPD. Breathing now improved.   Discussed procedure, indication, risks and benefits with her at bedside today. She understands and agree to proceed.   Vitals:   02/09/20 2028 02/10/20 0633 02/10/20 0722 02/10/20 0900  BP:  (!) 141/62    Pulse:  64    Resp:  18    Temp:  98.2 F (36.8 C)    TempSrc:  Oral    SpO2: 95% 96% 98%   Weight:    48.2 kg  Height:    5\' 4"  (1.626 m)   CBC Latest Ref Rng & Units 02/09/2020 02/03/2020 02/02/2020  WBC 4.0 - 10.5 K/uL 9.5 4.9 9.5  Hemoglobin 12.0 - 15.0 g/dL 9.7(L) 8.2(L) 8.3(L)  Hematocrit 36.0 - 46.0 % 32.7(L) 27.6(L) 28.3(L)  Platelets 150 - 400 K/uL 470(H) 329 311   BMP Latest Ref Rng & Units 02/10/2020 02/09/2020 02/08/2020  Glucose 70 - 99 mg/dL 90 89 106(H)  BUN 8 - 23 mg/dL 77(H) 92(H) 89(H)  Creatinine 0.44 - 1.00 mg/dL 1.84(H) 2.11(H) 2.11(H)  Sodium 135 - 145 mmol/L 138 137 133(L)  Potassium 3.5 - 5.1 mmol/L 4.4 4.8 4.8  Chloride 98 - 111 mmol/L 107 103 99  CO2 22 - 32 mmol/L 23 22 23   Calcium 8.9 - 10.3 mg/dL 8.2(L) 8.5(L) 8.3(L)   Plan:  Navigational FOB with EBUS, biopsies.  Transfer back to [facility] once completed.    Baltazar Apo, MD, PhD 02/10/2020, 9:43 AM [facility] [phone] or if no answer [phone]

## 2020-02-10 NOTE — Op Note (Signed)
Video Bronchoscopy with Electromagnetic Navigation and  Endobronchial Ultrasound Procedure Note  Date of Operation: 02/10/2020  Pre-op Diagnosis: Left upper lobe nodule, mediastinal adenopathy  Post-op Diagnosis: Same  Surgeon: Baltazar Apo  Assistants: None  Anesthesia: General endotracheal anesthesia  Operation: Flexible video fiberoptic bronchoscopy with electromagnetic navigation, endobronchial [place] and biopsies.  Estimated Blood Loss: Minimal  Complications: Apparent  Indications and History: [name] is a 74 y.o. female with history of tobacco use, COPD.  She was found to have left upper lobe pulmonary nodule with some pleural studding, mediastinal lymphadenopathy and recommendation was made to achieve tissue diagnosis via navigational bronchoscopy, endobronchial ultrasound with biopsies.  The risks, benefits, complications, treatment options and expected outcomes were discussed with the patient.  The possibilities of pneumothorax, pneumonia, reaction to medication, pulmonary aspiration, perforation of a viscus, bleeding, failure to diagnose a condition and creating a complication requiring transfusion or operation were discussed with the patient who freely signed the consent.    Description of Procedure: The patient was seen in the Preoperative Area, was examined and was deemed appropriate to proceed.  The patient was taken to [facility] [place] room 12, identified as [name] and the procedure verified as Flexible Video Fiberoptic Bronchoscopy.  A Time Out was held and the above information confirmed.   Prior to the date of the procedure a high-resolution CT scan of the chest was performed. Utilizing [place] a virtual tracheobronchial tree was generated to allow the creation of distinct navigation pathways to the patient's left upper lobe parenchymal abnormality. After being taken to the operating room general anesthesia was initiated and the patient  was  orally intubated. The video fiberoptic bronchoscope was introduced via the endotracheal tube and a general inspection was performed which showed yellow secretions, no endobronchial lesions. The extendable working channel and locator guide were introduced into the bronchoscope. The distinct navigation pathways prepared prior to this procedure were then utilized to navigate to within 0.5 of patient's lesion identified on CT scan. The extendable working channel was secured into place and the locator guide was withdrawn. Under fluoroscopic guidance left upper lobe transbronchial needle brushings, transbronchial Wang needle biopsies, and transbronchial forceps biopsies were performed to be sent for cytology and pathology.   The standard scope was then withdrawn and the endobronchial ultrasound was used to identify and characterize the peritracheal, hilar and bronchial lymph nodes. Inspection showed enlarged nodes at station 4L, 12 L.  There was also some prominent soft tissue in the subcarinal region, station 7. Using real-time ultrasound guidance Wang needle biopsies were take from Station 4L, 7, 12 L nodes and were sent for cytology. The patient tolerated the procedure well without apparent complications. There was no significant blood loss. The bronchoscope was withdrawn.  At the end of the procedure a general airway inspection was performed and there was no evidence of active bleeding. The bronchoscope was removed.  The patient tolerated the procedure well. There was no significant blood loss and there were no obvious complications. A post-procedural chest x-ray is pending.  Samples: 1. Transbronchial needle brushings from left upper lobe nodule 2. Transbronchial Wang needle biopsies from left upper lobe nodule 3. Transbronchial forceps biopsies from left upper lobe nodule 4. Wang needle biopsies from 4L node 5. Wang needle biopsies from 7 node 6. Wang needle biopsies from 12 L node  Plans:  The patient  will transfer back to [facility] from the PACU when recovered from anesthesia and after chest x-ray is  reviewed. We will review the cytology, pathology and microbiology results with the patient when they become available.     Baltazar Apo, MD, PhD 02/10/2020, 12:06 PM [facility] [phone] or if no answer [phone]

## 2020-02-10 NOTE — Transfer of Care (Signed)
Immediate Anesthesia Transfer of Care Note  Patient: [name]  Procedure(s) Performed: VIDEO BRONCHOSCOPY WITH ENDOBRONCHIAL ULTRASOUND (N/A Chest) Video Bronchoscopy With Endobronchial Navigation (N/A Chest)  Patient Location: PACU  Anesthesia Type:General  Level of Consciousness: awake and patient cooperative  Airway & Oxygen Therapy: Patient Spontanous Breathing and Patient connected to face mask oxygen  Post-op Assessment: Report given to RN and Post -op Vital signs reviewed and stable  Post vital signs: Reviewed and stable  Last Vitals:  Vitals Value Taken Time  BP 141/52 02/10/20 1207  Temp    Pulse 75 02/10/20 1208  Resp 16 02/10/20 1208  SpO2 100 % 02/10/20 1208  Vitals shown include unvalidated device data.  Last Pain:  Vitals:   02/10/20 0745  TempSrc:   PainSc: 7       Patients Stated Pain Goal: 2 (67/54/49 2010)  Complications: No apparent anesthesia complications

## 2020-02-10 NOTE — Progress Notes (Signed)
Dr. Lamonte Sakai called regarding chest xray in signed and held. MD stated he wanted the xray after the bronchoscopy today. Order updated.

## 2020-02-11 ENCOUNTER — Inpatient Hospital Stay (HOSPITAL_COMMUNITY): Payer: Medicare HMO

## 2020-02-11 DIAGNOSIS — R911 Solitary pulmonary nodule: Secondary | ICD-10-CM | POA: Diagnosis not present

## 2020-02-11 DIAGNOSIS — J441 Chronic obstructive pulmonary disease with (acute) exacerbation: Secondary | ICD-10-CM | POA: Diagnosis not present

## 2020-02-11 DIAGNOSIS — I5033 Acute on chronic diastolic (congestive) heart failure: Secondary | ICD-10-CM | POA: Diagnosis not present

## 2020-02-11 DIAGNOSIS — J9601 Acute respiratory failure with hypoxia: Secondary | ICD-10-CM | POA: Diagnosis not present

## 2020-02-11 LAB — GLUCOSE, CAPILLARY
Glucose-Capillary: 79 mg/dL (ref 70–99)
Glucose-Capillary: 106 mg/dL — ABNORMAL HIGH (ref 70–99)
Glucose-Capillary: 105 mg/dL — ABNORMAL HIGH (ref 70–99)
Glucose-Capillary: 100 mg/dL — ABNORMAL HIGH (ref 70–99)

## 2020-02-11 LAB — BASIC METABOLIC PANEL
Anion gap: 9 (ref 5–15)
BUN: 65 mg/dL — ABNORMAL HIGH (ref 8–23)
CO2: 23 mmol/L (ref 22–32)
Calcium: 8.1 mg/dL — ABNORMAL LOW (ref 8.9–10.3)
Chloride: 109 mmol/L (ref 98–111)
Creatinine, Ser: 1.93 mg/dL — ABNORMAL HIGH (ref 0.44–1.00)
GFR calc Af Amer: 29 mL/min — ABNORMAL LOW (ref >60)
GFR calc non Af Amer: 25 mL/min — ABNORMAL LOW (ref >60)
Glucose, Bld: 88 mg/dL (ref 70–99)
Potassium: 5.5 mmol/L — ABNORMAL HIGH (ref 3.5–5.1)
Sodium: 141 mmol/L (ref 135–145)

## 2020-02-11 LAB — CBC
HCT: 27.7 % — ABNORMAL LOW (ref 36.0–46.0)
Hemoglobin: 8.1 g/dL — ABNORMAL LOW (ref 12.0–15.0)
MCH: 26.4 pg (ref 26.0–34.0)
MCHC: 29.2 g/dL — ABNORMAL LOW (ref 30.0–36.0)
MCV: 90.2 fL (ref 80.0–100.0)
Platelets: 369 10*3/uL (ref 150–400)
RBC: 3.07 MIL/uL — ABNORMAL LOW (ref 3.87–5.11)
RDW: 17.1 % — ABNORMAL HIGH (ref 11.5–15.5)
WBC: 9.1 10*3/uL (ref 4.0–10.5)
nRBC: 0 % (ref 0.0–0.2)

## 2020-02-11 LAB — POTASSIUM: Potassium: 5.3 mmol/L — ABNORMAL HIGH (ref 3.5–5.1)

## 2020-02-11 LAB — SARS CORONAVIRUS 2 (TAT 6-24 HRS): SARS Coronavirus 2: NEGATIVE

## 2020-02-11 LAB — CYTOLOGY - NON PAP

## 2020-02-11 MED ORDER — SODIUM ZIRCONIUM CYCLOSILICATE 10 G PO PACK
10.0000 g | PACK | Freq: Two times a day (BID) | ORAL | Status: AC
  Administered 2020-02-11 – 2020-02-12 (×4): 10 g via ORAL
  Filled 2020-02-11 (×4): qty 1

## 2020-02-11 NOTE — TOC Progression Note (Signed)
Transition of Care [facility]) - Progression Note    Patient Details  Name: [name] MRN: [id number] Date of Birth: 1945/12/21  Transition of Care [facility]) CM/SW Contact  Mitzie Marlar, Juliann Pulse, RN Phone Number: 02/11/2020, 10:03 AM  Clinical Narrative:Await response from [facility] rep Freda Munro if they have received auth, & if covid needed.       Expected Discharge Plan: Skilled Nursing Facility Barriers to Discharge: Continued Medical Work up  Expected Discharge Plan and Services Expected Discharge Plan: [place]   Discharge Planning Services: CM Consult   Living arrangements for the past 2 months: Single Family Home                                       Social Determinants of Health (SDOH) Interventions    Readmission Risk Interventions Readmission Risk Prevention Plan 02/05/2020 12/24/2019 11/23/2019  Transportation Screening Complete Complete Complete  PCP or Specialist Appt within 3-5 Days - Complete Complete  HRI or Home Care Consult - Complete Complete  Social Work Consult for [place] Planning/Counseling - Complete Complete  Palliative Care Screening - Not Applicable Not Applicable  Medication Review Press photographer) Complete Complete Complete  PCP or Specialist appointment within 3-5 days of discharge Complete - -  [place] or Home Care Consult Complete - -  SW Recovery Care/Counseling Consult Complete - -  Palliative Care Screening Not Applicable - -  Skilled Nursing Facility Complete - -  Some recent data might be hidden

## 2020-02-11 NOTE — TOC Progression Note (Signed)
Transition of Care [facility]) - Progression Note    Patient Details  Name: [name] MRN: [id number] Date of Birth: 1946-03-24  Transition of Care [facility]) CM/SW Contact  Ross Ludwig, [place] Phone Number: 02/11/2020, 3:37 PM  Clinical Narrative:     CSW was informed by RN case manager, that Mendel Corning has received insurance authorization for patient to go to SNF pending negative Covid test.   Expected Discharge Plan: Skilled Nursing Facility Barriers to Discharge: Continued Medical Work up  Expected Discharge Plan and Services Expected Discharge Plan: [place]   Discharge Planning Services: CM Consult   Living arrangements for the past 2 months: Single Family Home                                       Social Determinants of Health (SDOH) Interventions    Readmission Risk Interventions Readmission Risk Prevention Plan 02/05/2020 12/24/2019 11/23/2019  Transportation Screening Complete Complete Complete  PCP or Specialist Appt within 3-5 Days - Complete Complete  HRI or Home Care Consult - Complete Complete  Social Work Consult for [place] Planning/Counseling - Complete Complete  Palliative Care Screening - Not Applicable Not Applicable  Medication Review Press photographer) Complete Complete Complete  PCP or Specialist appointment within 3-5 days of discharge Complete - -  [place] or Home Care Consult Complete - -  SW Recovery Care/Counseling Consult Complete - -  Palliative Care Screening Not Applicable - -  Skilled Nursing Facility Complete - -  Some recent data might be hidden

## 2020-02-11 NOTE — Progress Notes (Signed)
Per physical therapy: Pt's O2 sats were 98% on RA while ambulating in the hall.

## 2020-02-11 NOTE — Progress Notes (Addendum)
   NAME:  [name], MRN:  [id number], DOB:  1946-06-07, LOS: 8 ADMISSION DATE:  02/02/2020, CONSULTATION DATE:  02/04/20 REFERRING MD:  Dr. Tana Coast, CHIEF COMPLAINT:  Shortness of breath   Brief History   74 y/o F who was admitted on 4/6 with progressive shortness of breath.  CT imaging with enlarging left pulmonary nodule.    Past Medical History  COPD  OSA  Tobacco Abuse  Known Left Pulmonary Nodule - present in 07/2018 Asthma  DVT  Carotid Artery Occlusion  CAD / PVD HTN CHF - diastolic  Anemia  DM  CKD III  Parkinson's Disease  Anxiety  GERD  Graves Disease  Fibromyalgia  Chronic Pain Malnutrition   Significant Hospital Events   4/06 Admit   Consults:  PCCM   Procedures:    Significant Diagnostic Tests:   CT Chest w/o 4/6 >> 16 mm nodule in the subpleural LUL, small pleural effusions and mild interstitial edema, atelectasis bilaterally, progressive collaterals in the chest wall associated with left brachiocephalic vein occlusion / collapse (may account for mild interval enlargement of mediastinal lymph nodes)  ECHO 4/8 >> LVEF 50-55%, no RWMA, grade I diastolic dysfunction, RV systolic normal, mild to moderate mitral valve regurgitation  Micro Data:  COVID 4/6 >> negative  MRSA PCR 4/6 >> negative   Antimicrobials:  Doxycycline 4/8 >>  Interim history/subjective:  Comfortable   Objective   Blood pressure (Abnormal) 151/65, pulse 70, temperature 98.6 F (37 C), temperature source Oral, resp. rate 20, height 5\' 4"  (1.626 m), weight 48.5 kg, SpO2 98 %.        Intake/Output Summary (Last 24 hours) at 02/11/2020 1011 Last data filed at 02/11/2020 0500 Gross per 24 hour  Intake 1479.33 ml  Output 610 ml  Net 869.33 ml   Filed Weights   02/09/20 0500 02/10/20 0900 02/11/20 0639  Weight: 48.2 kg 48.2 kg 48.5 kg    Examination:  General this is a frail 74 year old white female resting in chair no acute distress HENT NCAT no JVD Pulm diffuse wheeze no  accessory use  Card RRR Abd not tender Ext no sig edema  Neuro awake and alert   Resolved Hospital Problem list      Assessment & Plan:   LUL Pulmonary Nodule now s/p video bronch ENB w/ Bx 4/15 -small apical PTX no longer appreciated (both on today's film and last evening) Plan F/u our office w/  NP walsh on 4/ at to review path results on 4/21 @ 2pm   COPD with Acute Exacerbation  Bronchiectasis  Reported COPD without baseline PFT's, mild airway thickening on CT but surprisingly little emphysema  Completed prednisone/steroid taper  Plan Cont BDs (brovan and pulmicort) at dc Will need PFTs as out-pt  Encourage Flutter  Left > Right Pleural Effusion Hx CHF   Plan Cont lasix   Tobacco Abuse  Plan Cont smoking cessation   Erick Colace ACNP-BC [place] Pager # [phone] OR # [phone] if no answer  02/11/2020, 10:11 AM

## 2020-02-11 NOTE — Care Management Important Message (Signed)
Important Message  Patient Details IM Letter given to Dessa Phi RN Case Manager to present to the Patient Name: [name] MRN: [id number] Date of Birth: 04/14/1946   Medicare Important Message Given:  Yes     Kerin Salen 02/11/2020, 9:35 AM

## 2020-02-11 NOTE — Progress Notes (Signed)
Physical Therapy Treatment Patient Details Name: [name] MRN: [id number] DOB: 27-Sep-1946 Today's Date: 02/11/2020    History of Present Illness 74 year old female with anxiety, depression, asthma, COPD, active smoker, carotid artery occlusion, CKD chronic pain of lower extremities, fibromyalgia, spinal stenosis of lumbar region, DJD, history of DVT, CAD, chronic diarrhea, GERD, Graves' disease, chronic headaches, hypertension, Parkinson's disease, PVD, diabetes mellitus presented due to progressively worsening dyspnea for the past few days.  Pt admitted for Acute respiratory failure with hypoxemia, likely secondary to acute on chronic diastolic CHF    PT Comments    Pt presents with general deconditioning secondary to the above diagnosis. Pt's O2 sats were 98% on RA with gait. Pt has limited mobility secondary to chronic back pain affecting her independence. Pt has made some progress with mobility goals, but has not achieved mod Independence therefore continuing to recommend SNF. Pt will benefit from continued acute skilled PT to maximize mobility and Independence.  Follow Up Recommendations  SNF;Supervision for mobility/OOB     Equipment Recommendations  None recommended by PT    Recommendations for Other Services       Precautions / Restrictions Precautions Precautions: Fall Precaution Comments: monitored sats during gait Restrictions Weight Bearing Restrictions: No    Mobility  Bed Mobility               General bed mobility comments: in recliner  Transfers Overall transfer level: Needs assistance Equipment used: Straight cane Transfers: Sit to/from Stand Sit to Stand: Min guard         General transfer comment: pt reported she ambulates at home with a cane so attempted to use one with transfers and gait. Pt unsteady with cane wanting to grab for counter for support.  Ambulation/Gait Ambulation/Gait assistance: Supervision Gait Distance (Feet): 50  Feet Assistive device: Rolling walker (2 wheeled) Gait Pattern/deviations: Step-through pattern;Decreased stride length Gait velocity: decreased   General Gait Details: 98% RA with gait, pt a little shakey therefore ambulation with cane was not the safest. Encouraged ambulation with RW for improved safety and Independence.   Stairs             Wheelchair Mobility    Modified Rankin (Stroke Patients Only)       Balance Overall balance assessment: Mild deficits observed, not formally tested                                          Cognition Arousal/Alertness: Awake/alert Behavior During Therapy: WFL for tasks assessed/performed Overall Cognitive Status: Within Functional Limits for tasks assessed                                 General Comments: pt stated she furniture walks or uses a cane at home. Pt reports she is limited with mobility due to back pain.      Exercises      General Comments        Pertinent Vitals/Pain Pain Assessment: 0-10 Faces Pain Scale: Hurts even more Pain Location: back Pain Descriptors / Indicators: Discomfort;Guarding Pain Intervention(s): Limited activity within patient's tolerance;Monitored during session    Home Living                      Prior Function  PT Goals (current goals can now be found in the care plan section) Progress towards PT goals: Progressing toward goals    Frequency    Min 3X/week      PT Plan Current plan remains appropriate    Co-evaluation              AM-PAC PT "6 Clicks" Mobility   Outcome Measure  Help needed turning from your back to your side while in a flat bed without using bedrails?: A Little Help needed moving from lying on your back to sitting on the side of a flat bed without using bedrails?: A Little Help needed moving to and from a bed to a chair (including a wheelchair)?: A Little Help needed standing up from a chair  using your arms (e.g., wheelchair or bedside chair)?: A Little Help needed to walk in hospital room?: A Little Help needed climbing 3-5 steps with a railing? : A Lot 6 Click Score: 17    End of Session Equipment Utilized During Treatment: Gait belt Activity Tolerance: Patient limited by pain Patient left: in chair;with call bell/phone within reach;with chair alarm set Nurse Communication: Mobility status PT Visit Diagnosis: Other abnormalities of gait and mobility (R26.89)     Time: 4481-8563 PT Time Calculation (min) (ACUTE ONLY): 19 min  Charges:  $Gait Training: 8-22 mins                     Theodoro Grist, PT   Lelon Mast 02/11/2020, 12:42 PM

## 2020-02-11 NOTE — Progress Notes (Signed)
PROGRESS NOTE    [name]  BWG:[id number] DOB: 1946-02-02 DOA: 02/02/2020 PCP: [name], MD    Chief Complaint  Patient presents with   Shortness of Breath    Brief Narrative:  [name] is a 74 y.o. female with medical history significant of anxiety, depression, asthma/COPD, active smoker, carotid artery occlusion, chronic kidney disease, chronic pain of lower extremities and feet, fibromyalgia, spinal stenosis of lumbar region, degenerative disc disease, history of DVT, CAD, chronic diarrhea, GERD, Graves' disease, chronic headaches, hypertensions, Parkinson's disease, peripheral vascular disease, PUD, recurrent URIs, sleep apnea not on CPAP, thrombophlebitis, type 2 diabetes with neuropathy, vitamin B12 deficiency who is coming to the emergency department for the third straight day due to progressively worse dyspnea for the past few days.  She has been using her bronchodilators without significant results at home.  Patient states that she woke up today around 3:40 AM with significant dyspnea.  EMS was called.  They gave her a DuoNeb and 125 mg of IV Solu-Medrol.  She was placed on a nonrebreather oxygen mask transiently.  She denies fever, chills, sore throat or rhinorrhea.  She has been having a mostly dry cough.  She denies hemoptysis, nausea or emesis, chest pain, palpitations, dizziness, diaphoresis, PND, orthopnea or pitting edema of the lower extremities.  Denies abdominal pain, diarrhea, constipation, melena or hematochezia.  No dysuria, frequency or hematuria.  ED Course: Initial vital signs temperature 97.9 F, pulse 96, respiration 24, blood pressure 168/84 mmHg and O2 sat 100% 12 LPM and on NRB.  She was most recently 99% on 2 LPM via nasal cannula.  In the ED, she received magnesium sulfate and bronchodilators.  Lab work shows a white count of 9.5, hemoglobin 8.3 g/dL and platelets 311.  BMP Sodium of 141, potassium 4.1, chloride 114 and CO2 16 mmol/L.   Glucose 124, BUN 39, creatinine 1.54 and calcium 8.4 mg/dL.  ABG shows a pH of 7.27, PCO2 of 37.6 and PO2 of 39.6 mmHg.  Bicarbonate was 16.9 and acid base deficit was 8.8 mmol/L.  BNP was 1305.3 pg/mL.  Magnesium is 2.5 and phosphorus 4.5 mg/dL.  Chest radiograph shows worsening interstitial opacities favoring edema.  There was small left pleural effusion.    Assessment & Plan:   Principal Problem:   Pulmonary nodule, left Active Problems:   Diabetes mellitus with neurological manifestation (HCC)   HTN (hypertension)   Depression   Dyslipidemia   Gastroesophageal reflux disease without esophagitis   Acute on chronic diastolic CHF (congestive heart failure) (HCC)   Acute respiratory failure with hypoxemia (HCC)   Vitamin B 12 deficiency   Tremor   SOB (shortness of breath)   Coronary artery calcification seen on CAT scan  1 acute respiratory failure with hypoxemia secondary to acute on chronic diastolic CHF and COPD exacerbation Patient presenting with acute respiratory failure with hypoxemia felt likely secondary to her chronic diastolic heart failure exacerbation and COPD exacerbation.  Patient placed on IV Lasix with good diuresis.  Patient seen in consultation by cardiology and due to creatinine plateauing at 2.2 diuretics held.  It was felt per cardiology that severe COPD likely contributing to her symptoms.  Patient improving clinically.  Status post steroid taper.  Continue Brovana, Pulmicort, doxycycline.  Check ambulatory sats.  Pulmonary following.  2.  Left upper lobe pulmonary nodule Pulmonary nodule noted to have enlarged to 16 mm from 7 mm on 07/2018.  Patient status post endobronchial ultrasound, navigational biopsy per Dr. Lamonte Sakai on  02/10/2020.  Lovenox was held.  Pulmonary following.  3.  Acute COPD exacerbation with bronchiectasis Patient with history of COPD.  Mild airway thickening noted on CT per pulmonary.  Patient improved clinically.  Continue doxycycline, Brovana,  Pulmicort, duo nebs.  Status post completion of steroid taper.  Will need outpatient follow-up with pulmonary for COPD management and PFTs.  Continue flutter valve.  Pulmonary following.  4.  Tobacco abuse Continue nicotine patch.  5.  Hyperkalemia Status post Lokelma.  Potassium at 5.5 this morning.  Repeat at 5.3.  On Lokelma 10 mg twice daily x2 days.  Resolved.  6.  Hypertension Metoprolol and Norvasc.   7.  Depression Continue Cymbalta.  8.  Tremors/history of Parkinson's disease Outpatient follow-up with neurology.  9.  Vitamin B12 deficiency Status post IM dose.  Continue oral vitamin B12 supplementation.  10.  Moderate protein calorie malnutrition Continue nutritional supplementation.  11.  Well-controlled type 2 diabetes with hyperglycemia Hemoglobin A1c 5.6.  CBG of 79 this morning.  Continue sliding scale insulin.       DVT prophylaxis: SCDs Code Status: DNR Family Communication: Updated patient.  No family at bedside. Disposition:   Status is: Inpatient    Dispo: The patient is from: Home              Anticipated d/c is to: SNF              Anticipated d/c date is: 02/12/2020              Patient currently clinically improved and awaiting SNF placement.         Consultants:   Cardiology: Dr. Fransico Him 02/04/2020  Pulmonary: Dr. Vaughan Browner  Procedures:   CT chest 02/02/2020  2D echo 02/04/2020  Bronchoscopy with electromagnetic navigation endobronchial ultrasound 02/10/2020 per Dr. Lamonte Sakai  Antimicrobials:   Doxycycline 02/04/2020     Subjective: Patient sitting up in chair.  States she is feeling much better.  Denies any chest pain.  Feels shortness of breath is improved.    Objective: Vitals:   02/10/20 2130 02/11/20 0631 02/11/20 0639 02/11/20 0845  BP:  (!) 151/65    Pulse:  70    Resp:  20    Temp:  98.6 F (37 C)    TempSrc:  Oral    SpO2: 100% 100%  98%  Weight:   48.5 kg   Height:        Intake/Output Summary (Last 24  hours) at 02/11/2020 1106 Last data filed at 02/11/2020 0500 Gross per 24 hour  Intake 1479.33 ml  Output 610 ml  Net 869.33 ml   Filed Weights   02/09/20 0500 02/10/20 0900 02/11/20 0639  Weight: 48.2 kg 48.2 kg 48.5 kg    Examination:  General exam: Tremors. Respiratory system: Clear to auscultation bilaterally with some decreased breath sounds in the bases.  No wheezing noted.  Speaking in full sentences.  Normal respiratory effort.    Cardiovascular system: Regular rate rhythm no murmurs rubs or gallops.  No JVD.  No lower extremity edema.  Gastrointestinal system: Abdomen is soft, nontender, nondistended, positive bowel sounds.  No rebound.  No guarding. Central nervous system: Alert and oriented. No focal neurological deficits. Extremities: Symmetric 5 x 5 power. Skin: No rashes, lesions or ulcers Psychiatry: Judgement and insight appear normal. Mood & affect appropriate.     Data Reviewed: I have personally reviewed following labs and imaging studies  CBC: Recent Labs  Lab 02/09/20 0526 02/11/20 0514  WBC 9.5 9.1  HGB 9.7* 8.1*  HCT 32.7* 27.7*  MCV 89.3 90.2  PLT 470* 749    Basic Metabolic Panel: Recent Labs  Lab 02/07/20 0539 02/07/20 0539 02/08/20 0510 02/09/20 0735 02/10/20 0511 02/11/20 0514 02/11/20 0825  NA 137  --  133* 137 138 141  --   K 4.7   < > 4.8 4.8 4.4 5.5* 5.3*  CL 104  --  99 103 107 109  --   CO2 23  --  23 22 23 23   --   GLUCOSE 109*  --  106* 89 90 88  --   BUN 90*  --  89* 92* 77* 65*  --   CREATININE 2.16*  --  2.11* 2.11* 1.84* 1.93*  --   CALCIUM 8.4*  --  8.3* 8.5* 8.2* 8.1*  --    < > = values in this interval not displayed.    GFR: Estimated Creatinine Clearance: 19.6 mL/min (A) (by C-G formula based on SCr of 1.93 mg/dL (H)).  Liver Function Tests: No results for input(s): AST, ALT, ALKPHOS, BILITOT, PROT, ALBUMIN in the last 168 hours.  CBG: Recent Labs  Lab 02/10/20 1207 02/10/20 1410 02/10/20 1627  02/10/20 2032 02/11/20 0757  GLUCAP 115* 151* 203* 169* 79     Recent Results (from the past 240 hour(s))  SARS CORONAVIRUS 2 (TAT 6-24 HRS) Nasopharyngeal Nasopharyngeal Swab     Status: None   Collection Time: 02/02/20  7:54 AM   Specimen: Nasopharyngeal Swab  Result Value Ref Range Status   SARS Coronavirus 2 NEGATIVE NEGATIVE Final    Comment: (NOTE) SARS-CoV-2 target nucleic acids are NOT DETECTED. The SARS-CoV-2 RNA is generally detectable in upper and lower respiratory specimens during the acute phase of infection. Negative results do not preclude SARS-CoV-2 infection, do not rule out co-infections with other pathogens, and should not be used as the sole basis for treatment or other patient management decisions. Negative results must be combined with clinical observations, patient history, and epidemiological information. The expected result is Negative. Fact Sheet for Patients: SugarRoll.be Fact Sheet for Healthcare Providers: https://www.woods-mathews.com/ This test is not yet approved or cleared by the [place] FDA and  has been authorized for detection and/or diagnosis of SARS-CoV-2 by FDA under an Emergency Use Authorization (EUA). This EUA will remain  in effect (meaning this test can be used) for the duration of the COVID-19 declaration under Section 56 4(b)(1) of the Act, 21 U.S.C. section 360bbb-3(b)(1), unless the authorization is terminated or revoked sooner. Performed at [facility], [place] [address], [place], [place] [address]   MRSA PCR Screening     Status: None   Collection Time: 02/02/20  6:43 PM   Specimen: Nasal Mucosa; Nasopharyngeal  Result Value Ref Range Status   MRSA by PCR NEGATIVE NEGATIVE Final    Comment:        The GeneXpert MRSA Assay (FDA approved for NASAL specimens only), is one component of a comprehensive MRSA colonization surveillance program. It is not intended to diagnose  MRSA infection nor to guide or monitor treatment for MRSA infections. Performed at [facility], [place] [address] [place], [place] [address]          Radiology Studies: DG Chest 2 View  Result Date: 02/11/2020 CLINICAL DATA:  Acute pneumothorax EXAM: CHEST - 2 VIEW COMPARISON:  02/10/2020 FINDINGS: No visible pneumothorax currently. Increasing left basilar atelectasis or infiltrate with small left effusion. Right lung clear. Mild  hyperinflation. Heart is normal size. No acute bony abnormality. IMPRESSION: No visible pneumothorax currently. Hyperinflation. Left base atelectasis or infiltrate with small left effusion, slightly increased since prior study. Electronically Signed   By: Rolm Baptise M.D.   On: 02/11/2020 08:31   DG Chest Port 1 View  Result Date: 02/10/2020 CLINICAL DATA:  Pneumothorax EXAM: PORTABLE CHEST 1 VIEW COMPARISON:  Radiograph 02/10/2020, CT chest 02/02/2020 FINDINGS: Chronic emphysematous and bronchitic features with coarsened interstitial changes compatible with history of COPD. Streaky bibasilar areas of opacity, likely atelectasis. A small medial left apical pneumothorax is less well visualized on this examination, possibly resolved. Small left apical nodule is again seen. No visible effusion. Borderline cardiomegaly is similar to prior. No acute osseous or soft tissue abnormality. Degenerative changes are present in the imaged spine and shoulders. Telemetry leads overlie the chest. IMPRESSION: 1. A small medial left apical pneumothorax is less well visualized on this examination, possibly resolved. 2. Redemonstration of a medial left apical lung nodule seen on comparison CT. 3. Chronic emphysematous and bronchitic features with coarsened interstitial changes. Basilar atelectasis. Electronically Signed   By: Lovena Le M.D.   On: 02/10/2020 17:08   DG Chest Port 1 View  Result Date: 02/10/2020 CLINICAL DATA:  Post bronchoscopy with biopsy today  EXAM: PORTABLE CHEST 1 VIEW COMPARISON:  Portable exam 1233 hours compared to 02/02/2020 Correlation CT chest 02/02/2020 FINDINGS: Borderline enlargement of cardiac silhouette. Mediastinal contours and pulmonary vascularity normal. Atherosclerotic calcification aorta. Emphysematous and bronchitic changes consistent with COPD. Mild bibasilar atelectasis. Improved infiltrates/edema since previous exam. Tiny medial LEFT apical pneumothorax. No pleural effusions. LEFT upper lobe nodule identified on prior CT exam is poorly seen radiographically at the medial LEFT upper lobe. IMPRESSION: Tiny medial LEFT apical pneumothorax post bronchoscopy and biopsy. Underlying COPD changes with bibasilar atelectasis. Findings called to Kathlee Nations RN in PACU on 02/10/2020 at 1303 hours. Electronically Signed   By: Lavonia Dana M.D.   On: 02/10/2020 13:05   DG C-ARM BRONCHOSCOPY  Result Date: 02/10/2020 C-ARM BRONCHOSCOPY: Fluoroscopy was utilized by the requesting physician.  No radiographic interpretation.        Scheduled Meds:  allopurinol  100 mg Oral Daily   amLODipine  10 mg Oral Daily   arformoterol  15 mcg Nebulization BID   benzonatate  100 mg Oral TID   budesonide (PULMICORT) nebulizer solution  0.5 mg Nebulization BID   doxycycline  100 mg Oral Q12H   DULoxetine  20 mg Oral Daily   feeding supplement  1 Container Oral Q24H   feeding supplement (KATE FARMS STANDARD 1.4)  325 mL Oral Q24H   insulin aspart  0-15 Units Subcutaneous TID WC   ipratropium-albuterol  3 mL Nebulization BID   metoprolol succinate  25 mg Oral Daily   multivitamin with minerals  1 tablet Oral Daily   nicotine  14 mg Transdermal Daily   pantoprazole  40 mg Oral BID   sodium zirconium cyclosilicate  10 g Oral BID   vitamin B-12  100 mcg Oral Daily   Continuous Infusions:    LOS: 8 days    Time spent: 40 minutes    Irine Seal, MD [facility]   To contact the attending provider between 7A-7P  or the covering provider during after hours 7P-7A, please log into the web site [email or web address] and access using universal [place] password for that web site. If you do not have the password, please call the hospital operator.  02/11/2020, 11:06 AM

## 2020-02-12 DIAGNOSIS — Z9889 Other specified postprocedural states: Secondary | ICD-10-CM

## 2020-02-12 DIAGNOSIS — K219 Gastro-esophageal reflux disease without esophagitis: Secondary | ICD-10-CM

## 2020-02-12 DIAGNOSIS — R911 Solitary pulmonary nodule: Secondary | ICD-10-CM | POA: Diagnosis not present

## 2020-02-12 DIAGNOSIS — E1140 Type 2 diabetes mellitus with diabetic neuropathy, unspecified: Secondary | ICD-10-CM

## 2020-02-12 DIAGNOSIS — J441 Chronic obstructive pulmonary disease with (acute) exacerbation: Secondary | ICD-10-CM | POA: Diagnosis not present

## 2020-02-12 DIAGNOSIS — J9601 Acute respiratory failure with hypoxia: Secondary | ICD-10-CM | POA: Diagnosis not present

## 2020-02-12 DIAGNOSIS — I5033 Acute on chronic diastolic (congestive) heart failure: Secondary | ICD-10-CM | POA: Diagnosis not present

## 2020-02-12 LAB — GLUCOSE, CAPILLARY
Glucose-Capillary: 81 mg/dL (ref 70–99)
Glucose-Capillary: 169 mg/dL — ABNORMAL HIGH (ref 70–99)
Glucose-Capillary: 92 mg/dL (ref 70–99)
Glucose-Capillary: 141 mg/dL — ABNORMAL HIGH (ref 70–99)

## 2020-02-12 LAB — BASIC METABOLIC PANEL
Anion gap: 6 (ref 5–15)
BUN: 54 mg/dL — ABNORMAL HIGH (ref 8–23)
CO2: 24 mmol/L (ref 22–32)
Calcium: 8.1 mg/dL — ABNORMAL LOW (ref 8.9–10.3)
Chloride: 108 mmol/L (ref 98–111)
Creatinine, Ser: 1.85 mg/dL — ABNORMAL HIGH (ref 0.44–1.00)
GFR calc Af Amer: 31 mL/min — ABNORMAL LOW (ref >60)
GFR calc non Af Amer: 26 mL/min — ABNORMAL LOW (ref >60)
Glucose, Bld: 87 mg/dL (ref 70–99)
Potassium: 5 mmol/L (ref 3.5–5.1)
Sodium: 138 mmol/L (ref 135–145)

## 2020-02-12 LAB — CBC
HCT: 27.7 % — ABNORMAL LOW (ref 36.0–46.0)
Hemoglobin: 8.1 g/dL — ABNORMAL LOW (ref 12.0–15.0)
MCH: 26.2 pg (ref 26.0–34.0)
MCHC: 29.2 g/dL — ABNORMAL LOW (ref 30.0–36.0)
MCV: 89.6 fL (ref 80.0–100.0)
Platelets: 371 10*3/uL (ref 150–400)
RBC: 3.09 MIL/uL — ABNORMAL LOW (ref 3.87–5.11)
RDW: 16.9 % — ABNORMAL HIGH (ref 11.5–15.5)
WBC: 8.2 10*3/uL (ref 4.0–10.5)
nRBC: 0 % (ref 0.0–0.2)

## 2020-02-12 LAB — SURGICAL PATHOLOGY

## 2020-02-12 MED ORDER — SODIUM ZIRCONIUM CYCLOSILICATE 10 G PO PACK: 10.0000 g | PACK | Freq: Two times a day (BID) | ORAL | 0 refills | Status: AC

## 2020-02-12 MED ORDER — ALLOPURINOL 100 MG PO TABS: 100.0000 mg | ORAL_TABLET | Freq: Every day | ORAL | Status: AC

## 2020-02-12 MED ORDER — SODIUM ZIRCONIUM CYCLOSILICATE 10 G PO PACK: 10.0000 g | PACK | Freq: Two times a day (BID) | ORAL | 0 refills | Status: DC

## 2020-02-12 MED ORDER — B-12 1000 MCG PO TABS: 1000.0000 ug | ORAL_TABLET | Freq: Every day | ORAL | Status: AC

## 2020-02-12 MED ORDER — ARFORMOTEROL TARTRATE 15 MCG/2ML IN NEBU: 15.0000 ug | INHALATION_SOLUTION | Freq: Two times a day (BID) | RESPIRATORY_TRACT | 1 refills | Status: DC

## 2020-02-12 MED ORDER — BENZONATATE 100 MG PO CAPS: 100.0000 mg | ORAL_CAPSULE | Freq: Three times a day (TID) | ORAL | 0 refills | Status: DC | PRN

## 2020-02-12 MED ORDER — BUDESONIDE 0.5 MG/2ML IN SUSP: 0.5000 mg | Freq: Two times a day (BID) | RESPIRATORY_TRACT | 1 refills | Status: AC

## 2020-02-12 MED ORDER — KATE FARMS STANDARD 1.4 PO LIQD: 325.0000 mL | ORAL | Status: DC

## 2020-02-12 MED ORDER — IPRATROPIUM-ALBUTEROL 0.5-2.5 (3) MG/3ML IN SOLN: 3.0000 mL | Freq: Two times a day (BID) | RESPIRATORY_TRACT | 0 refills | Status: AC

## 2020-02-12 MED ORDER — NICOTINE 14 MG/24HR TD PT24: 14.0000 mg | MEDICATED_PATCH | Freq: Every day | TRANSDERMAL | 0 refills | Status: DC

## 2020-02-12 NOTE — TOC Transition Note (Signed)
Transition of Care [facility]) - CM/SW Discharge Note   Patient Details  Name: [name] MRN: [id number] Date of Birth: 06-07-1946  [facility] CM/SW Contact:  Dessa Phi, RN Phone Number: 02/12/2020, 3:18 PM   Clinical Narrative: d/c summary sent await rm#,tel# for nsg to give report. Then  PTAR to be called.      Final next level of care: Skilled Nursing Facility Barriers to Discharge: No Barriers Identified   Patient Goals and CMS Choice Patient states their goals for this hospitalization and ongoing recovery are:: go to rehab CMS Medicare.gov Compare Post Acute Care list provided to:: Patient Represenative (must comment)    Discharge Placement   Existing PASRR number confirmed : 02/08/20          Patient chooses bed at: [facility] Patient to be transferred to facility by: [place] Name of family member notified: [name] [phone] Patient and family notified of of transfer: 02/12/20  Discharge Plan and Services   Discharge Planning Services: CM Consult                                 Social Determinants of Health ([place]) Interventions     Readmission Risk Interventions Readmission Risk Prevention Plan 02/05/2020 12/24/2019 11/23/2019  Transportation Screening Complete Complete Complete  PCP or Specialist Appt within 3-5 Days - Complete Complete  HRI or Home Care Consult - Complete Complete  Social Work Consult for [place] Planning/Counseling - Complete Complete  Palliative Care Screening - Not Applicable Not Applicable  Medication Review Press photographer) Complete Complete Complete  PCP or Specialist appointment within 3-5 days of discharge Complete - -  [place] or Home Care Consult Complete - -  SW Recovery Care/Counseling Consult Complete - -  Palliative Care Screening Not Applicable - -  Skilled Nursing Facility Complete - -  Some recent data might be hidden

## 2020-02-12 NOTE — Discharge Summary (Signed)
Physician Discharge Summary  [name] WRU:[id number] DOB: Nov 07, 1945 DOA: 02/02/2020  PCP: [name], MD  Admit date: 02/02/2020 Discharge date: 02/12/2020  Time spent: 60 minutes  Recommendations for Outpatient Follow-up:  1. Follow-up with MD at skilled nursing facility.  On follow-up patient will need a basic metabolic profile done in 1 week to follow-up on electrolytes and renal function.  Patient will need a CBC done to follow-up on H&H.  Vitamin B12 levels will need to be followed up upon. 2. Follow-up with Geraldo Pitter, NP on 02/17/2020.  On follow-up patient's COPD management will need to be followed up upon as well as patient will likely require PFTs and will need follow-up on bronchoscopy results. 3. Follow-up with cardiology, Melina Copa, PA on 02/22/2020.   Discharge Diagnoses:  Principal Problem:   Pulmonary nodule, left Active Problems:   Diabetes mellitus with neurological manifestation (HCC)   HTN (hypertension)   Depression   Dyslipidemia   Gastroesophageal reflux disease without esophagitis   Acute on chronic diastolic CHF (congestive heart failure) (HCC)   Acute respiratory failure with hypoxemia (HCC)   Vitamin B 12 deficiency   Tremor   SOB (shortness of breath)   Coronary artery calcification seen on CAT scan   Discharge Condition: Stable and improved  Diet recommendation: Carb modified diet  Filed Weights   02/09/20 0500 02/10/20 0900 02/11/20 0639  Weight: 48.2 kg 48.2 kg 48.5 kg    History of present illness:  HPI per Dr. [name] is a 74 y.o. female with medical history significant of anxiety, depression, asthma/COPD, active smoker, carotid artery occlusion, chronic kidney disease, chronic pain of lower extremities and feet, fibromyalgia, spinal stenosis of lumbar region, degenerative disc disease, history of DVT, CAD, chronic diarrhea, GERD, Graves' disease, chronic headaches, hypertensions, Parkinson's disease, peripheral  vascular disease, PUD, recurrent URIs, sleep apnea not on CPAP, thrombophlebitis, type 2 diabetes with neuropathy, vitamin B12 deficiency who was coming to the emergency department for the third straight day due to progressively worse dyspnea for the past few days.  She had been using her bronchodilators without significant results at home.  Patient stated that she woke up on day of admission, around 3:40 AM with significant dyspnea.  EMS was called.  They gave her a DuoNeb and 125 mg of IV Solu-Medrol.  She was placed on a nonrebreather oxygen mask transiently.  She denied fever, chills, sore throat or rhinorrhea.  She had been having a mostly dry cough.  She denied hemoptysis, nausea or emesis, chest pain, palpitations, dizziness, diaphoresis, PND, orthopnea or pitting edema of the lower extremities.  Denied abdominal pain, diarrhea, constipation, melena or hematochezia.  No dysuria, frequency or hematuria.  ED Course: Initial vital signs temperature 97.9 F, pulse 96, respiration 24, blood pressure 168/84 mmHg and O2 sat 100% 12 LPM and on NRB.  She was most recently 99% on 2 LPM via nasal cannula.  In the ED, she received magnesium sulfate and bronchodilators.  Lab work shows a white count of 9.5, hemoglobin 8.3 g/dL and platelets 311.  BMP Sodium of 141, potassium 4.1, chloride 114 and CO2 16 mmol/L.  Glucose 124, BUN 39, creatinine 1.54 and calcium 8.4 mg/dL.  ABG shows a pH of 7.27, PCO2 of 37.6 and PO2 of 39.6 mmHg.  Bicarbonate was 16.9 and acid base deficit was 8.8 mmol/L.  BNP was 1305.3 pg/mL.  Magnesium is 2.5 and phosphorus 4.5 mg/dL.  Chest radiograph shows worsening interstitial opacities favoring edema.  There was small left pleural effusion.  Hospital Course:  1 acute respiratory failure with hypoxemia secondary to acute on chronic diastolic CHF and COPD exacerbation Patient presented with acute respiratory failure with hypoxemia felt likely secondary to her chronic diastolic heart  failure exacerbation and COPD exacerbation.  Patient placed on IV Lasix with good diuresis.  Patient seen in consultation by cardiology and due to creatinine plateauing at 2.2 diuretics held.  It was felt per cardiology that severe COPD likely contributing to her symptoms.  Patient was placed on IV steroid taper, received a full course of doxycycline, placed on Brovana, Pulmicort, duo nebs.  Patient improved clinically.  Acute respiratory failure improved and had resolved by day of discharge.  Patient be discharged back on home regimen of oral Lasix.  Outpatient follow-up with pulmonary.    2.  Left upper lobe pulmonary nodule Pulmonary nodule noted to have enlarged to 16 mm from 7 mm on 07/2018.  Patient status post endobronchial ultrasound, navigational biopsy per Dr. Lamonte Sakai on 02/10/2020.  Lovenox was held.  Outpatient follow-up with pulmonary.   3.  Acute COPD exacerbation with bronchiectasis Patient with history of COPD.  Mild airway thickening noted on CT per pulmonary.  Patient completed a steroid taper during the hospitalization as well as a full course of doxycycline.  Patient maintained on Brovana Pulmicort and duo nebs.  Patient was followed by pulmonary during the hospitalization.  Patient has been scheduled for outpatient follow-up with pulmonary for COPD management and PFTs.  Patient was also maintained on flutter valve.  Patient will be discharged in stable and improved condition.    4.  Tobacco abuse Patient maintained on nicotine patch.  5.  Hyperkalemia Status post Lokelma.  Potassium was 5.0 by day of discharge.  Patient's oral potassium supplementation discontinued on discharge.  Patient will be discharged on Lokelma 10 mg twice daily for 2 days.  Will need repeat labs in 1 week on follow-up at SNF.    6.  Hypertension Patient maintained on home regimen of metoprolol and Norvasc.   7.  Depression Patient maintained on home regimen of Cymbalta.  8.  Tremors/history of  Parkinson's disease Outpatient follow-up with neurology.  9.  Vitamin B12 deficiency Status post IM dose. Patient subsequently transition to oral vitamin B12 supplementation.  Outpatient follow-up.    10.  Moderate protein calorie malnutrition Patient was maintained on nutritional supplementation.   11.  Well-controlled type 2 diabetes with hyperglycemia Hemoglobin A1c 5.6.    Patient was maintained on sliding scale insulin during the hospitalization.  Outpatient follow-up.     Procedures:  CT chest 02/02/2020  2D echo 02/04/2020  Bronchoscopy with electromagnetic navigation endobronchial ultrasound 02/10/2020 per Dr. Lamonte Sakai  Consultations:  Cardiology: Dr. Fransico Him 02/04/2020  Pulmonary: Dr. Vaughan Browner  Discharge Exam: Vitals:   02/12/20 1029 02/12/20 1035  BP:    Pulse:    Resp:    Temp:    SpO2: 100% 100%    General: NAD Cardiovascular: RRR Respiratory: CTAB.  No wheezing, no crackles, no rhonchi.  Normal respiratory effort.  Discharge Instructions   Discharge Instructions    Diet Carb Modified   Complete by: As directed    Increase activity slowly   Complete by: As directed      Allergies as of 02/12/2020   No Known Allergies     Medication List    STOP taking these medications   potassium chloride SA 20 MEQ tablet Commonly known as: KLOR-CON   predniSONE  50 MG tablet Commonly known as: DELTASONE     TAKE these medications   albuterol (2.5 MG/3ML) 0.083% nebulizer solution Commonly known as: PROVENTIL Inhale 3 mLs into the lungs 2 (two) times daily.   albuterol 108 (90 Base) MCG/ACT inhaler Commonly known as: VENTOLIN HFA Inhale 2 puffs into the lungs every 6 (six) hours as needed for wheezing or shortness of breath.   allopurinol 100 MG tablet Commonly known as: ZYLOPRIM Take 1 tablet (100 mg total) by mouth daily. What changed:   medication strength  how much to take   amLODipine 10 MG tablet Commonly known as: NORVASC Take 1  tablet (10 mg total) by mouth daily.   arformoterol 15 MCG/2ML Nebu Commonly known as: BROVANA Take 2 mLs (15 mcg total) by nebulization 2 (two) times daily.   atorvastatin 20 MG tablet Commonly known as: LIPITOR Take 1 tablet (20 mg total) by mouth at bedtime.   B-12 1000 MCG Tabs Take 1 tablet (1,000 mcg total) by mouth daily. Start taking on: February 13, 2020   benzonatate 100 MG capsule Commonly known as: TESSALON Take 1 capsule (100 mg total) by mouth 3 (three) times daily as needed for cough.   budesonide 0.5 MG/2ML nebulizer solution Commonly known as: PULMICORT Take 2 mLs (0.5 mg total) by nebulization 2 (two) times daily.   DULoxetine 20 MG capsule Commonly known as: CYMBALTA Take 20 mg by mouth daily.   feeding supplement (KATE FARMS STANDARD 1.4) Liqd liquid Take 325 mLs by mouth daily.   furosemide 40 MG tablet Commonly known as: Lasix Take 1 tablet (40 mg total) by mouth daily. What changed:   when to take this  reasons to take this   ipratropium-albuterol 0.5-2.5 (3) MG/3ML Soln Commonly known as: DUONEB Take 3 mLs by nebulization 2 (two) times daily.   metoprolol succinate 25 MG 24 hr tablet Commonly known as: TOPROL-XL Take 25 mg by mouth daily. What changed: Another medication with the same name was removed. Continue taking this medication, and follow the directions you see here.   multivitamin capsule Take 1 capsule by mouth daily.   nicotine 14 mg/24hr patch Commonly known as: NICODERM CQ - dosed in mg/24 hours Place 1 patch (14 mg total) onto the skin daily. Start taking on: February 13, 2020   pantoprazole 40 MG tablet Commonly known as: PROTONIX Take 40 mg by mouth 2 (two) times daily.   sodium zirconium cyclosilicate 10 g Pack packet Commonly known as: LOKELMA Take 10 g by mouth 2 (two) times daily for 2 days.   umeclidinium bromide 62.5 MCG/INH Aepb Commonly known as: INCRUSE ELLIPTA Inhale 1 puff into the lungs daily.      No  Known Allergies  Contact information for follow-up providers    Dunn, Dayna N, PA-C Follow up on 02/22/2020.   Specialties: Cardiology, Radiology Contact information: [address] [place] [address] [phone]        Martyn Ehrich, NP Follow up on 02/17/2020.   Specialty: Pulmonary Disease Why: 2 pm  Contact information: [address] [place] [address] [phone]            Contact information for after-discharge care    [facility] Follow up.   Service: Skilled Nursing Why: MD AT SNF Contact information: [place] [phone]  The results of significant diagnostics from this hospitalization (including imaging, microbiology, ancillary and laboratory) are listed below for reference.    Significant Diagnostic Studies: DG Chest 2 View  Result Date: 02/11/2020 CLINICAL DATA:  Acute pneumothorax EXAM: CHEST - 2 VIEW COMPARISON:  02/10/2020 FINDINGS: No visible pneumothorax currently. Increasing left basilar atelectasis or infiltrate with small left effusion. Right lung clear. Mild hyperinflation. Heart is normal size. No acute bony abnormality. IMPRESSION: No visible pneumothorax currently. Hyperinflation. Left base atelectasis or infiltrate with small left effusion, slightly increased since prior study. Electronically Signed   By: Rolm Baptise M.D.   On: 02/11/2020 08:31   DG Chest 2 View  Result Date: 02/01/2020 CLINICAL DATA:  Shortness of breath. EXAM: CHEST - 2 VIEW COMPARISON:  01/31/2020 FINDINGS: The cardiac silhouette, mediastinal and hilar contours are stable. Stable underlying chronic lung disease with emphysema and pulmonary scarring. Persistent left pleural effusion and overlying atelectasis and scarring. Stable vague rounded density in the left upper lobe. No definite acute pulmonary findings. IMPRESSION: Chronic lung disease  with persistent left pleural effusion and overlying atelectasis and scarring. No definite acute overlying pulmonary process. Electronically Signed   By: Marijo Sanes M.D.   On: 02/01/2020 05:37   DG Chest 2 View  Result Date: 01/31/2020 CLINICAL DATA:  COPD exacerbation. EXAM: CHEST - 2 VIEW COMPARISON:  12/22/2019 FINDINGS: Left lung base opacity, similar to the prior exam, consistent with a small pleural effusion and associated lung base atelectasis or scarring. Lungs are hyperexpanded. There are chronic bronchitic changes at both lung bases, as well as bilateral interstitial thickening, findings are stable from the prior study. There is a nodular opacity that projects over the medial left upper lobe, which may be due to a combination of superimposed osseous structures, but raises possibility of a true lung nodule. This was not evident previously. No pneumothorax. Cardiac silhouette is normal in size. No mediastinal or hilar masses or evidence of adenopathy. Skeletal structures are intact. IMPRESSION: 1. No convincing acute cardiopulmonary disease. 2. There are significant chronic findings including left lung base opacity that is consistent with a combination of pleural fluid and atelectasis or scarring, bilateral basilar bronchitic changes bilateral interstitial thickening. Lungs are hyperexpanded consistent with COPD. 3. Possible nodule in the medial left upper lobe. Recommend follow-up unenhanced chest CT on a nonemergent basis for further assessment. Electronically Signed   By: Lajean Manes M.D.   On: 01/31/2020 06:00   CT Chest Wo Contrast  Result Date: 02/02/2020 CLINICAL DATA:  shortness of breath and abnormal chest x-ray EXAM: CT CHEST WITHOUT CONTRAST TECHNIQUE: Multidetector CT imaging of the chest was performed following the standard protocol without IV contrast. COMPARISON:  10/17/2017 FINDINGS: Cardiovascular: Borderline heart size. No pericardial effusion. There is aortic and coronary  atherosclerosis. No acute vascular finding without contrast Mediastinum/Nodes: Increase in size of mediastinal lymph nodes since 2018 but strictly still within normal limits at 15 mm or less in short axis, largest located at the precarinal station. Surgical clips around the GE junction, likely hernia repair. Progressive venous collaterals in the chest wall and upper mediastinum. The left brachiocephalic vein is collapsed. Lungs/Pleura: Small bilateral pleural effusion which is dependent and layering. There is streaky density with volume loss at the lung bases. Scar-like appearance with equivocal cylindrical bronchiectasis at the medial segment right middle lobe. Mild generalized interlobular septal thickening at the lung bases. Subpleural nodule in the left upper lobe measuring 16 mm, new from 2018 when it was only a tiny  subpleural nodule. Upper Abdomen: No acute finding. Musculoskeletal: No acute finding IMPRESSION: 1. 16 mm nodule in the subpleural left upper lobe, worrisome for bronchogenic carcinoma. Recommend referral to multi disciplinary thoracic oncology. 2. Small pleural effusions and mild interstitial edema. Atelectasis is seen at both lung bases. 3. Progressive collaterals in the chest wall associated with left brachiocephalic vein occlusion/collapse. This may account for mild interval enlargement of mediastinal lymph nodes. 4.  Aortic Atherosclerosis (ICD10-I70.0).  Coronary atherosclerosis. Electronically Signed   By: Monte Fantasia M.D.   On: 02/02/2020 07:36   DG Chest Port 1 View  Result Date: 02/10/2020 CLINICAL DATA:  Pneumothorax EXAM: PORTABLE CHEST 1 VIEW COMPARISON:  Radiograph 02/10/2020, CT chest 02/02/2020 FINDINGS: Chronic emphysematous and bronchitic features with coarsened interstitial changes compatible with history of COPD. Streaky bibasilar areas of opacity, likely atelectasis. A small medial left apical pneumothorax is less well visualized on this examination, possibly resolved.  Small left apical nodule is again seen. No visible effusion. Borderline cardiomegaly is similar to prior. No acute osseous or soft tissue abnormality. Degenerative changes are present in the imaged spine and shoulders. Telemetry leads overlie the chest. IMPRESSION: 1. A small medial left apical pneumothorax is less well visualized on this examination, possibly resolved. 2. Redemonstration of a medial left apical lung nodule seen on comparison CT. 3. Chronic emphysematous and bronchitic features with coarsened interstitial changes. Basilar atelectasis. Electronically Signed   By: Lovena Le M.D.   On: 02/10/2020 17:08   DG Chest Port 1 View  Result Date: 02/10/2020 CLINICAL DATA:  Post bronchoscopy with biopsy today EXAM: PORTABLE CHEST 1 VIEW COMPARISON:  Portable exam 1233 hours compared to 02/02/2020 Correlation CT chest 02/02/2020 FINDINGS: Borderline enlargement of cardiac silhouette. Mediastinal contours and pulmonary vascularity normal. Atherosclerotic calcification aorta. Emphysematous and bronchitic changes consistent with COPD. Mild bibasilar atelectasis. Improved infiltrates/edema since previous exam. Tiny medial LEFT apical pneumothorax. No pleural effusions. LEFT upper lobe nodule identified on prior CT exam is poorly seen radiographically at the medial LEFT upper lobe. IMPRESSION: Tiny medial LEFT apical pneumothorax post bronchoscopy and biopsy. Underlying COPD changes with bibasilar atelectasis. Findings called to Kathlee Nations RN in PACU on 02/10/2020 at 1303 hours. Electronically Signed   By: Lavonia Dana M.D.   On: 02/10/2020 13:05   DG Chest Port 1 View  Result Date: 02/02/2020 CLINICAL DATA:  Shortness of breath EXAM: PORTABLE CHEST 1 VIEW COMPARISON:  Yesterday FINDINGS: Cardiomegaly with hazy and interstitial opacity. Small left pleural effusion. No pneumothorax. Postoperative left neck. IMPRESSION: Worsening interstitial opacity favoring edema. Small left pleural effusion. Electronically Signed    By: Monte Fantasia M.D.   On: 02/02/2020 05:52   ECHOCARDIOGRAM COMPLETE  Result Date: 02/04/2020    ECHOCARDIOGRAM REPORT   Patient Name:   [name] Date of Exam: 02/04/2020 Medical Rec #:  [id number]       Height:       64.0 in Accession #:    [id number]      Weight:       107.6 lb Date of Birth:  [birth year]        BSA:          1.503 m Patient Age:    74 years        BP:           119/68 mmHg Patient Gender: F               HR:  76 bpm. Exam Location:  Inpatient Procedure: 2D Echo Indications:    Congestive Heart Failure 428.0 / I50.9  History:        Patient has prior history of Echocardiogram examinations, most                 recent 11/04/2019. COPD; Risk Factors:Tobacco use, Hypertension,                 Diabetes and Dyslipidemia. AKI                 CKD.  Sonographer:    Vikki Ports Turrentine Referring Phys: [id number] RIPUDEEP K RAI IMPRESSIONS  1. Left ventricular ejection fraction, by estimation, is 50 to 55%. The left ventricle has low normal function. The left ventricle has no regional wall motion abnormalities. Left ventricular diastolic parameters are consistent with Grade I diastolic dysfunction (impaired relaxation).  2. Right ventricular systolic function is normal. The right ventricular size is normal. There is mildly elevated pulmonary artery systolic pressure. The estimated right ventricular systolic pressure is 88.4 mmHg.  3. The mitral valve is myxomatous. Mild to moderate mitral valve regurgitation. No evidence of mitral stenosis.  4. The aortic valve is normal in structure. Aortic valve regurgitation is not visualized. No aortic stenosis is present.  5. The inferior vena cava is normal in size with greater than 50% respiratory variability, suggesting right atrial pressure of 3 mmHg. Comparison(s): Prior images reviewed side by side. Mitral regurgitation has increased when compared to prior study. FINDINGS  Left Ventricle: Left ventricular ejection fraction, by estimation, is 50 to 55%.  The left ventricle has low normal function. The left ventricle has no regional wall motion abnormalities. The left ventricular internal cavity size was normal in size. There is no left ventricular hypertrophy. Left ventricular diastolic parameters are consistent with Grade I diastolic dysfunction (impaired relaxation). Right Ventricle: The right ventricular size is normal. No increase in right ventricular wall thickness. Right ventricular systolic function is normal. There is mildly elevated pulmonary artery systolic pressure. The tricuspid regurgitant velocity is 2.74  m/s, and with an assumed right atrial pressure of 3 mmHg, the estimated right ventricular systolic pressure is 16.6 mmHg. Left Atrium: Left atrial size was normal in size. Right Atrium: Right atrial size was normal in size. Pericardium: There is no evidence of pericardial effusion. Mitral Valve: The mitral valve is myxomatous. There is moderate thickening of the mitral valve leaflet(s). Normal mobility of the mitral valve leaflets. Mild to moderate mitral valve regurgitation. No evidence of mitral valve stenosis. Tricuspid Valve: The tricuspid valve is normal in structure. Tricuspid valve regurgitation is mild . No evidence of tricuspid stenosis. Aortic Valve: The aortic valve is normal in structure. Aortic valve regurgitation is not visualized. No aortic stenosis is present. Pulmonic Valve: The pulmonic valve was normal in structure. Pulmonic valve regurgitation is not visualized. No evidence of pulmonic stenosis. Aorta: The aortic root is normal in size and structure. Venous: The inferior vena cava is normal in size with greater than 50% respiratory variability, suggesting right atrial pressure of 3 mmHg. IAS/Shunts: No atrial level shunt detected by color flow Doppler.  LEFT VENTRICLE PLAX 2D LVIDd:         4.50 cm  Diastology LVIDs:         3.30 cm  LV e' lateral:   6.09 cm/s LV PW:         0.90 cm  LV E/e' lateral: 17.4 LV IVS:  0.90 cm   LV e' medial:    6.09 cm/s LVOT diam:     1.90 cm  LV E/e' medial:  17.4 LV SV:         50 LV SV Index:   34 LVOT Area:     2.84 cm  RIGHT VENTRICLE RV S prime:     12.80 cm/s TAPSE (M-mode): 2.0 cm LEFT ATRIUM             Index       RIGHT ATRIUM           Index LA diam:        3.10 cm 2.06 cm/m  RA Area:     12.20 cm LA Vol (A2C):   40.8 ml 27.14 ml/m RA Volume:   27.50 ml  18.29 ml/m LA Vol (A4C):   39.7 ml 26.41 ml/m LA Biplane Vol: 41.0 ml 27.28 ml/m  AORTIC VALVE LVOT Vmax:   86.80 cm/s LVOT Vmean:  67.800 cm/s LVOT VTI:    0.178 m  AORTA Ao Root diam: 2.60 cm MITRAL VALVE                TRICUSPID VALVE MV Area (PHT): 3.83 cm     TR Peak grad:   30.0 mmHg MV Decel Time: 198 msec     TR Vmax:        274.00 cm/s MV E velocity: 106.00 cm/s MV A velocity: 117.00 cm/s  SHUNTS MV E/A ratio:  0.91         Systemic VTI:  0.18 m                             Systemic Diam: 1.90 cm Candee Furbish MD Electronically signed by Candee Furbish MD Signature Date/Time: 02/04/2020/11:53:29 AM    Final    DG C-ARM BRONCHOSCOPY  Result Date: 02/10/2020 C-ARM BRONCHOSCOPY: Fluoroscopy was utilized by the requesting physician.  No radiographic interpretation.    Microbiology: Recent Results (from the past 240 hour(s))  MRSA PCR Screening     Status: None   Collection Time: 02/02/20  6:43 PM   Specimen: Nasal Mucosa; Nasopharyngeal  Result Value Ref Range Status   MRSA by PCR NEGATIVE NEGATIVE Final    Comment:        The GeneXpert MRSA Assay (FDA approved for NASAL specimens only), is one component of a comprehensive MRSA colonization surveillance program. It is not intended to diagnose MRSA infection nor to guide or monitor treatment for MRSA infections. Performed at [facility], [place] [address] [place], [place] [address]   SARS CORONAVIRUS 2 (TAT 6-24 HRS) Nasopharyngeal Nasopharyngeal Swab     Status: None   Collection Time: 02/11/20  3:05 PM   Specimen: Nasopharyngeal Swab  Result  Value Ref Range Status   SARS Coronavirus 2 NEGATIVE NEGATIVE Final    Comment: (NOTE) SARS-CoV-2 target nucleic acids are NOT DETECTED. The SARS-CoV-2 RNA is generally detectable in upper and lower respiratory specimens during the acute phase of infection. Negative results do not preclude SARS-CoV-2 infection, do not rule out co-infections with other pathogens, and should not be used as the sole basis for treatment or other patient management decisions. Negative results must be combined with clinical observations, patient history, and epidemiological information. The expected result is Negative. Fact Sheet for Patients: SugarRoll.be Fact Sheet for Healthcare Providers: https://www.woods-mathews.com/ This test is not yet approved or cleared by the [place] FDA  and  has been authorized for detection and/or diagnosis of SARS-CoV-2 by FDA under an Emergency Use Authorization (EUA). This EUA will remain  in effect (meaning this test can be used) for the duration of the COVID-19 declaration under Section 56 4(b)(1) of the Act, 21 U.S.C. section 360bbb-3(b)(1), unless the authorization is terminated or revoked sooner. Performed at [facility], [place] [address], [place], [place] [address]      Labs: Basic Metabolic Panel: Recent Labs  Lab 02/08/20 0510 02/08/20 0510 02/09/20 0735 02/10/20 0511 02/11/20 0514 02/11/20 0825 02/12/20 0500  NA 133*  --  137 138 141  --  138  K 4.8   < > 4.8 4.4 5.5* 5.3* 5.0  CL 99  --  103 107 109  --  108  CO2 23  --  22 23 23   --  24  GLUCOSE 106*  --  89 90 88  --  87  BUN 89*  --  92* 77* 65*  --  54*  CREATININE 2.11*  --  2.11* 1.84* 1.93*  --  1.85*  CALCIUM 8.3*  --  8.5* 8.2* 8.1*  --  8.1*   < > = values in this interval not displayed.   Liver Function Tests: No results for input(s): AST, ALT, ALKPHOS, BILITOT, PROT, ALBUMIN in the last 168 hours. No results for input(s): LIPASE,  AMYLASE in the last 168 hours. No results for input(s): AMMONIA in the last 168 hours. CBC: Recent Labs  Lab 02/09/20 0526 02/11/20 0514 02/12/20 0500  WBC 9.5 9.1 8.2  HGB 9.7* 8.1* 8.1*  HCT 32.7* 27.7* 27.7*  MCV 89.3 90.2 89.6  PLT 470* 369 371   Cardiac Enzymes: No results for input(s): CKTOTAL, CKMB, CKMBINDEX, TROPONINI in the last 168 hours. BNP: BNP (last 3 results) Recent Labs    01/31/20 0550 02/01/20 0725 02/02/20 0546  BNP 1,213.5* 1,560.5* 1,305.3*    ProBNP (last 3 results) No results for input(s): PROBNP in the last 8760 hours.  CBG: Recent Labs  Lab 02/11/20 1146 02/11/20 1713 02/11/20 2102 02/12/20 0747 02/12/20 1155  GLUCAP 106* 105* 100* 81 169*       Signed:  Irine Seal MD.  [facility] 02/12/2020, 3:01 PM

## 2020-02-12 NOTE — TOC Transition Note (Signed)
Transition of Care [facility]) - CM/SW Discharge Note   Patient Details  Name: MIASIA CRABTREE MRN: [id number] Date of Birth: 11/10/1945  Transition of Care [facility]) CM/SW Contact:  Dessa Phi, RN Phone Number: 02/12/2020, 3:44 PM   Clinical Narrative:  Patient going to [facility] rm[id number] tel# for nurse to call report [phone].PTAR called. No further CM needs.     Final next level of care: Skilled Nursing Facility Barriers to Discharge: No Barriers Identified   Patient Goals and CMS Choice Patient states their goals for this hospitalization and ongoing recovery are:: go to rehab CMS Medicare.gov Compare Post Acute Care list provided to:: Patient Represenative (must comment)    Discharge Placement   Existing PASRR number confirmed : 02/08/20          Patient chooses bed at: [facility] Patient to be transferred to facility by: [place] Name of family member notified: [name] [phone] Patient and family notified of of transfer: 02/12/20  Discharge Plan and Services   Discharge Planning Services: CM Consult                                 Social Determinants of Health ([place]) Interventions     Readmission Risk Interventions Readmission Risk Prevention Plan 02/05/2020 12/24/2019 11/23/2019  Transportation Screening Complete Complete Complete  PCP or Specialist Appt within 3-5 Days - Complete Complete  HRI or Home Care Consult - Complete Complete  Social Work Consult for [place] Planning/Counseling - Complete Complete  Palliative Care Screening - Not Applicable Not Applicable  Medication Review Press photographer) Complete Complete Complete  PCP or Specialist appointment within 3-5 days of discharge Complete - -  [place] or Home Care Consult Complete - -  SW Recovery Care/Counseling Consult Complete - -  Palliative Care Screening Not Applicable - -  Skilled Nursing Facility Complete - -  Some recent data might be hidden

## 2020-02-12 NOTE — Progress Notes (Signed)
Occupational Therapy Treatment Patient Details Name: [name] MRN: [id number] DOB: 01-Jan-1946 Today's Date: 02/12/2020    History of present illness 74 year old female with anxiety, depression, asthma, COPD, active smoker, carotid artery occlusion, CKD chronic pain of lower extremities, fibromyalgia, spinal stenosis of lumbar region, DJD, history of DVT, CAD, chronic diarrhea, GERD, Graves' disease, chronic headaches, hypertension, Parkinson's disease, PVD, diabetes mellitus presented due to progressively worsening dyspnea for the past few days.  Pt admitted for Acute respiratory failure with hypoxemia, likely secondary to acute on chronic diastolic CHF   OT comments  Patient progressing towards goals, limited with functional ambulation due to back pain however patient states this is chronic issue. Patient reports should be going to SNF today.   Follow Up Recommendations  SNF    Equipment Recommendations  Other (comment)(defer to next venue)       Precautions / Restrictions Precautions Precautions: Fall       Mobility Bed Mobility               General bed mobility comments: in recliner  Transfers Overall transfer level: Needs assistance Equipment used: Rolling walker (2 wheeled) Transfers: Sit to/from Stand Sit to Stand: Min guard              Balance Overall balance assessment: Needs assistance         Standing balance support: Single extremity supported;Bilateral upper extremity supported;During functional activity Standing balance-Leahy Scale: Poor Standing balance comment: patient initially stand without support however quickly asks OT to hold a hand, transition to walker for B UE support for increased safety                           ADL either performed or assessed with clinical judgement   ADL Overall ADL's : Needs assistance/impaired Eating/Feeding: Independent                       Toilet Transfer: Min  Marine scientist Details (indicate cue type and reason): simulated with functional ambulation/mobility, min guard for safety pt tremulous with mobility         Functional mobility during ADLs: Min guard;Rolling walker General ADL Comments: limited functional mobility and participation in self care due to decreased activity tolerance, min guard for safety               Cognition Arousal/Alertness: Awake/alert Behavior During Therapy: WFL for tasks assessed/performed Overall Cognitive Status: Within Functional Limits for tasks assessed                                                     Pertinent Vitals/ Pain       Pain Assessment: Faces Faces Pain Scale: Hurts even more Pain Location: back Pain Descriptors / Indicators: Discomfort;Guarding Pain Intervention(s): Monitored during session;Limited activity within patient's tolerance         Frequency  Min 2X/week        Progress Toward Goals  OT Goals(current goals can now be found in the care plan section)  Progress towards OT goals: Progressing toward goals  Acute Rehab OT Goals Patient Stated Goal: to have more energy OT Goal Formulation: With patient Time For Goal Achievement: 02/23/20 Potential to Achieve Goals: Good ADL Goals Pt Will Perform Upper Body Dressing: with modified  independence;sitting Pt Will Perform Lower Body Dressing: with modified independence;sitting/lateral leans;sit to/from stand Pt Will Transfer to Toilet: with modified independence;ambulating;regular height toilet(walker) Pt Will Perform Tub/Shower Transfer: Shower transfer;with modified independence;ambulating;rolling walker;shower seat Additional ADL Goal #1: Patient will tolerate standing for 8 minutes at modified independent level in order to participate in self care and IADL tasks safely.  Plan Discharge plan remains appropriate       AM-PAC OT "6 Clicks" Daily Activity     Outcome Measure    Help from another person eating meals?: None Help from another person taking care of personal grooming?: A Little Help from another person toileting, which includes using toliet, bedpan, or urinal?: A Little Help from another person bathing (including washing, rinsing, drying)?: A Little Help from another person to put on and taking off regular upper body clothing?: A Little Help from another person to put on and taking off regular lower body clothing?: A Little 6 Click Score: 19    End of Session Equipment Utilized During Treatment: Rolling walker  OT Visit Diagnosis: Unsteadiness on feet (R26.81);Muscle weakness (generalized) (M62.81)   Activity Tolerance Patient limited by pain   Patient Left in chair;with call bell/phone within reach           Time: 1233-1247 OT Time Calculation (min): 14 min  Charges: OT General Charges $OT Visit: 1 Visit OT Treatments $Self Care/Home Management : 8-22 mins  Delbert Phenix OT Pager: [place] 02/12/2020, 2:09 PM

## 2020-02-12 NOTE — Progress Notes (Signed)
Patient discharged to [facility], she's alert and oriented X 4, vital signs are stable, no complain. She was transported via [place]. Voice mail left for patient's daughter, [name] to inform her that pt was transported to snf.

## 2020-02-12 NOTE — Progress Notes (Signed)
Pt to be discharged to [facility] SNF this afternoon. Report called and Jannet Askew RN accepting report for this facility

## 2020-02-12 NOTE — TOC Progression Note (Signed)
Transition of Care [facility]) - Progression Note    Patient Details  Name: [name] MRN: [id number] Date of Birth: September 08, 1946  Transition of [facility]) CM/SW Contact  Antinio Sanderfer, Juliann Pulse, RN Phone Number: 02/12/2020, 10:54 AM  Clinical Narrative:for d/c today awaiting d/c summary to sent to [facility] have auth,& bed.       Expected Discharge Plan: Skilled Nursing Facility Barriers to Discharge: No Barriers Identified  Expected Discharge Plan and Services Expected Discharge Plan: [place]   Discharge Planning Services: CM Consult   Living arrangements for the past 2 months: Single Family Home                                       Social Determinants of Health (SDOH) Interventions    Readmission Risk Interventions Readmission Risk Prevention Plan 02/05/2020 12/24/2019 11/23/2019  Transportation Screening Complete Complete Complete  PCP or Specialist Appt within 3-5 Days - Complete Complete  HRI or Home Care Consult - Complete Complete  Social Work Consult for [place] Planning/Counseling - Complete Complete  Palliative Care Screening - Not Applicable Not Applicable  Medication Review Press photographer) Complete Complete Complete  PCP or Specialist appointment within 3-5 days of discharge Complete - -  [place] or Home Care Consult Complete - -  SW Recovery Care/Counseling Consult Complete - -  Palliative Care Screening Not Applicable - -  Skilled Nursing Facility Complete - -  Some recent data might be hidden

## 2020-02-17 ENCOUNTER — Inpatient Hospital Stay: Payer: Medicare HMO | Admitting: Primary Care

## 2020-02-17 ENCOUNTER — Other Ambulatory Visit: Payer: Self-pay

## 2020-02-19 ENCOUNTER — Inpatient Hospital Stay: Payer: Medicare HMO | Admitting: Primary Care

## 2020-02-19 ENCOUNTER — Telehealth: Payer: Self-pay | Admitting: Primary Care

## 2020-02-19 ENCOUNTER — Encounter: Payer: Self-pay | Admitting: Physician Assistant

## 2020-02-19 NOTE — Progress Notes (Deleted)
Cardiology Office Note    Date:  02/19/2020   ID:  [name], DOB 12-06-1945, MRN [id number]  PCP:  [name], MD  Cardiologist:  Ena Dawley, MD  Electrophysiologist:  None   Chief Complaint: f/u shortness of breath  History of Present Illness:   [name] is a 74 y.o. female with history of chronic diastolic heart failure, PAD, Parkinson's, HTN, carotid artery occlusion s/p L CEA 2011 (followed by vascular), CKD stage 3-4, mild-moderate mitral regurgitation, chronic lower extremity pain, fibromyalgia, GERD, Graves disease, COPD, sleep apnea not on CPAP, depression, B12 deficiency, anemia, protein-calorie malnutrition, hyperkalemia who presents for post-hospital follow-up.   She has remote history of nonobstructive CAD by cath in 2012 and subsequent coronary and aortic atherosclerosis on CT. She was recently admitted 01/2020 with increasing SOB after having been seen in the ED numerous times prior with COPD exacerbation. She was found to be continually anemic (progressive since 2019) with AKI on CKD and and elevated BNP. She also had minimally elevated troponin. X-ray from 4/5 showed no edema but persistent left pleural effusion with chronic lung disease. Chest CT 4/6 showed 42m nodule in the subpleural LUL worrisome for bronchogenic CA, mild intersitial edema, atelectasis, coronary atherosclerosis and aortic atherosclerosis. She received a few doses of oral Lasix (and one 210mIV Lasix) early on admission but bumped her creatinine and did not appear volume overloaded on cardiology evaluation so diuretics were discontinued. [facility]. 2D echo 02/04/20 showed EF 50-55%, grade 1DD, normal RV, mildly elevated PASP, mild-moderate MR. Her symptoms were felt related to lung disease with recommendation to consider outpatient nuclear stress test once respiratory status improved. Last labs personally reviewed from 01/2020 include Hgb 8.1, K 5.0, Cr  1.85, A1C 5.6, abnormal alk phos, 10/2019 TSH wnl, LDL 63. Per pulmonary note, the patient underwent bronch during admission on 4/14 and "Surgical pathology showed atypical cells, attempts to further characterize atypical cells are norencontributory. Cytology LUL brushing showed rare atypical squamous cells. LUL fine needle bx showed no malignant cells." They are planning PET and [place], carotids vvs nst    Shortness of breath CAD by CT scan Chronic diastolic CHF Mitral regurgitation Lung nodule   Past Medical History:  Diagnosis Date  . Anemia   . Anxiety and depression   . Arthritis   . Asthma   . Carotid artery occlusion   . Chronic pain    leg and feet  . CKD (chronic kidney disease), stage IV ([facility]  . COPD (chronic obstructive pulmonary disease) ([facility]  . Coronary artery disease   . DDD (degenerative disc disease)   . Diarrhea    chronic   . DVT (deep venous thrombosis) ([facility]  . Dyspnea   . Fibromyalgia   . GERD (gastroesophageal reflux disease)   . Grave's disease   . Headache   . HOH (hard of hearing)   . Hypertension   . Lung nodule   . Mitral regurgitation   . OP (osteoporosis)   . Parkinson's disease ([facility] [place]  . Peripheral vascular disease ([facility] [place]  . Protein calorie malnutrition ([facility]  . PUD (peptic ulcer disease)   . Recurrent upper respiratory infection (URI)   . Rhinitis   . Sleep apnea   . Spinal stenosis of lumbar region 04/23/2013  . Thrombophlebitis   . Tobacco use disorder 04/23/2013  . Vitamin B 12 deficiency     Past Surgical History:  Procedure Laterality Date  . CAROTID ENDARTERECTOMY Left Sept. 20,2011   cea  . CHOLECYSTECTOMY  1999  . GASTRECTOMY     age 89   Part of small intestin and part of stomach  . LEFT HEART CATHETERIZATION WITH CORONARY ANGIOGRAM N/A 10/02/2011   Procedure: LEFT HEART CATHETERIZATION WITH CORONARY ANGIOGRAM;  Surgeon: Sueanne Margarita, MD;  Location: [place] CATH LAB;  Service: Cardiovascular;   Laterality: N/A;  . VIDEO BRONCHOSCOPY WITH ENDOBRONCHIAL NAVIGATION N/A 02/10/2020   Procedure: Video Bronchoscopy With Endobronchial Navigation;  Surgeon: Collene Gobble, MD;  Location: [place];  Service: Thoracic;  Laterality: N/A;  . VIDEO BRONCHOSCOPY WITH ENDOBRONCHIAL ULTRASOUND N/A 02/10/2020   Procedure: VIDEO BRONCHOSCOPY WITH ENDOBRONCHIAL ULTRASOUND;  Surgeon: Collene Gobble, MD;  Location: [place];  Service: Thoracic;  Laterality: N/A;    Current Medications: No outpatient medications have been marked as taking for the 02/22/20 encounter (Appointment) with Charlie Pitter, PA-C.   ***   Allergies:   Patient has no known allergies.   Social History   Socioeconomic History  . Marital status: Widowed    Spouse name: Not on file  . Number of children: 1  . Years of education: Not on file  . Highest education level: Not on file  Occupational History  . Occupation: retired  Tobacco Use  . Smoking status: Current Every Day Smoker    Packs/day: 2.00    Years: 50.00    Pack years: 100.00    Types: Cigarettes  . Smokeless tobacco: Never Used  . Tobacco comment: cessation info given and reviewed  Substance and Sexual Activity  . Alcohol use: No    Alcohol/week: 0.0 standard drinks  . Drug use: No  . Sexual activity: Not Currently    Birth control/protection: Post-menopausal  Other Topics Concern  . Not on file  Social History Narrative  . Not on file   Social Determinants of Health   Financial Resource Strain: High Risk  . Difficulty of Paying Living Expenses: Very hard  Food Insecurity: Food Insecurity Present  . Worried About Charity fundraiser in the Last Year: Sometimes true  . Ran Out of Food in the Last Year: Never true  Transportation Needs: No Transportation Needs  . Lack of Transportation (Medical): No  . Lack of Transportation (Non-Medical): No  Physical Activity:   . Days of Exercise per Week:   . Minutes of Exercise per Session:   Stress: Stress Concern  Present  . Feeling of Stress : To some extent  Social Connections: Moderately Isolated  . Frequency of Communication with Friends and Family: Three times a week  . Frequency of Social Gatherings with Friends and Family: Once a week  . Attends Religious Services: Never  . Active Member of Clubs or Organizations: No  . Attends Archivist Meetings: Never  . Marital Status: Widowed     Family History:  The patient's ***family history includes Diabetes in her father, mother, sister, and sister; Heart attack in her father, mother, and sister; Heart disease in her father, mother, and sister; Hyperlipidemia in her father, mother, sister, and sister; Hypertension in her father, mother, sister, and sister; Other in her father, mother, and sister; Peripheral vascular disease in her sister; Varicose Veins in her father, mother, sister, and sister.  ROS:   Please see the history of present illness. Otherwise, review of systems is positive for ***.  All other systems are reviewed and otherwise negative.    EKGs/Labs/Other Studies  Reviewed:    Studies reviewed are outlined and summarized above. Reports included below if pertinent.  2D echo 01/2020  1. Left ventricular ejection fraction, by estimation, is 50 to 55%. The  left ventricle has low normal function. The left ventricle has no regional  wall motion abnormalities. Left ventricular diastolic parameters are  consistent with Grade I diastolic  dysfunction (impaired relaxation).  2. Right ventricular systolic function is normal. The right ventricular  size is normal. There is mildly elevated pulmonary artery systolic  pressure. The estimated right ventricular systolic pressure is 10.9 mmHg.  3. The mitral valve is myxomatous. Mild to moderate mitral valve  regurgitation. No evidence of mitral stenosis.  4. The aortic valve is normal in structure. Aortic valve regurgitation is  not visualized. No aortic stenosis is present.  5.  The inferior vena cava is normal in size with greater than 50%  respiratory variability, suggesting right atrial pressure of 3 mmHg.   Comparison(s): Prior images reviewed side by side. Mitral regurgitation  has increased when compared to prior study.   Remote DC summary 2012 alludes to cath with nonobstructive CAD.    EKG:  EKG is ordered today, personally reviewed, demonstrating ***  Recent Labs: 11/20/2019: TSH 0.392 02/02/2020: B Natriuretic Peptide 1,305.3; Magnesium 2.5 02/03/2020: ALT 25 02/12/2020: BUN 54; Creatinine, Ser 1.85; Hemoglobin 8.1; Platelets 371; Potassium 5.0; Sodium 138  Recent Lipid Panel    Component Value Date/Time   CHOL 145 11/05/2019 0458   TRIG 90 11/05/2019 0458   HDL 64 11/05/2019 0458   CHOLHDL 2.3 11/05/2019 0458   VLDL 18 11/05/2019 0458   LDLCALC 63 11/05/2019 0458    PHYSICAL EXAM:    VS:  There were no vitals taken for this visit.  BMI: There is no height or weight on file to calculate BMI.  GEN: Well nourished, well developed, in no acute distress HEENT: normocephalic, atraumatic Neck: no JVD, carotid bruits, or masses Cardiac: ***RRR; no murmurs, rubs, or gallops, no edema  Respiratory:  clear to auscultation bilaterally, normal work of breathing GI: soft, nontender, nondistended, + BS MS: no deformity or atrophy Skin: warm and dry, no rash Neuro:  Alert and Oriented x 3, Strength and sensation are intact, follows commands Psych: euthymic mood, full affect  Wt Readings from Last 3 Encounters:  02/11/20 107 lb (48.5 kg)  02/01/20 106 lb 14.8 oz (48.5 kg)  01/31/20 107 lb (48.5 kg)     ASSESSMENT & PLAN:   1. ***  Disposition: F/u with ***   Medication Adjustments/Labs and Tests Ordered: Current medicines are reviewed at length with the patient today.  Concerns regarding medicines are outlined above. Medication changes, Labs and Tests ordered today are summarized above and listed in the Patient Instructions accessible in  Encounters.   Signed, Charlie Pitter, PA-C  02/19/2020 5:32 PM    [facility] [place], [place], [place]  [address] Phone: [phone]; Fax: [phone]

## 2020-02-19 NOTE — Telephone Encounter (Signed)
I have a follow-up today to review pathology. You performed navigational bronchoscopy on this lady for an left upper lobe lung nodules on lung on 02/10/20  Surgical pathology showed atypical cells, attempts to further characterize atypical cells are norencontributory.  Cytology LUL brushing showed rare atypical squamous cells. LUL fine needle bx showed no malignant cells   What is your input on this lady. With her hx of smoking and her nodule doubling in size over the last two year. I do not see that she has had a PET scan. Would you recommend getting a a reapyt CT or PET in 3 months?

## 2020-02-19 NOTE — Telephone Encounter (Signed)
Ok, great thank you. Appreciate your feedback.

## 2020-02-19 NOTE — Progress Notes (Deleted)
@Patient  ID: [name], female    DOB: [birth year], 74 y.o.   MRN: [id number]  No chief complaint on file.   Referring provider: [name], MD  HPI: 74 year old female, current every day smoker. PMH significant for COPD, left upper lobe pulmonary nodule, chronic respiratory failure, congestive heart failure, tabacco abuse, parkinson disease, diabetes types 2. Patient has had frequent ED visits and hospitalizations d/t COPD exacerbation, seen recently in April in-patient by pulmonary.   Most recent hospitalization April 02/02/20-02/12/20: Patient presented with acute on chronic respiratory failure. Improved with diuresis and IV steriods. Known left upper lobe pulmonary nodules noted to have enlarged to 55mm from 70mm on 07/2018. Patient underwent an endobronchial ultrasound navigational bronchoscopy with Dr. Lamonte Sakai on 02/10/20. Her pathology report was positive for atypical cells, unable to further characterize. LUL brushing showed rare atypical squamous cells and needle bx was negative for malignant cells.    Hospital/ ED visits: 02/02/20- COPD exacerbation treated with bronchodilators and doxycyline 02/01/20- COPD exacerbation 01/31/20- Shortness of breath  12/21/19- Hypoxemia  11/19/19- Acute on chronic congestive heart failure 11/04/19- COPD exacerbation   No Known Allergies  Immunization History  Administered Date(s) Administered  . Influenza-Unspecified 07/06/2019  . Tdap 08/31/2019    Past Medical History:  Diagnosis Date  . Anxiety and depression   . Arthritis   . Asthma   . Carotid artery occlusion   . Chronic kidney disease   . Chronic pain    leg and feet  . COPD (chronic obstructive pulmonary disease) ([place])   . Coronary artery disease   . DDD (degenerative disc disease)   . Diarrhea    chronic   . DVT (deep venous thrombosis) ([place])   . Dyspnea   . Fibromyalgia   . GERD (gastroesophageal reflux disease)   . Grave's disease   . Headache   . HOH (hard of  hearing)   . Hypertension   . OP (osteoporosis)   . Parkinson's disease ([place])   . Peripheral vascular disease ([place])   . PUD (peptic ulcer disease)   . Recurrent upper respiratory infection (URI)   . Rhinitis   . Sleep apnea   . Spinal stenosis of lumbar region 04/23/2013  . Thrombophlebitis   . Tobacco use disorder 04/23/2013  . Vitamin B 12 deficiency     Tobacco History: Social History   Tobacco Use  Smoking Status Current Every Day Smoker  . Packs/day: 2.00  . Years: 50.00  . Pack years: 100.00  . Types: Cigarettes  Smokeless Tobacco Never Used  Tobacco Comment   cessation info given and reviewed   Ready to quit: Not Answered Counseling given: Not Answered Comment: cessation info given and reviewed   Outpatient Medications Prior to Visit  Medication Sig Dispense Refill  . albuterol (PROVENTIL) (2.5 MG/3ML) 0.083% nebulizer solution Inhale 3 mLs into the lungs 2 (two) times daily. 75 mL 12  . albuterol (VENTOLIN HFA) 108 (90 Base) MCG/ACT inhaler Inhale 2 puffs into the lungs every 6 (six) hours as needed for wheezing or shortness of breath. 8 g 0  . allopurinol (ZYLOPRIM) 100 MG tablet Take 1 tablet (100 mg total) by mouth daily.    [name] amLODipine (NORVASC) 10 MG tablet Take 1 tablet (10 mg total) by mouth daily. 30 tablet   . arformoterol (BROVANA) 15 MCG/2ML NEBU Take 2 mLs (15 mcg total) by nebulization 2 (two) times daily. 120 mL 1  . atorvastatin (LIPITOR) 20 MG tablet Take 1 tablet (20  mg total) by mouth at bedtime. 30 tablet 2  . benzonatate (TESSALON) 100 MG capsule Take 1 capsule (100 mg total) by mouth 3 (three) times daily as needed for cough. 20 capsule 0  . budesonide (PULMICORT) 0.5 MG/2ML nebulizer solution Take 2 mLs (0.5 mg total) by nebulization 2 (two) times daily. 120 mL 1  . Cyanocobalamin (VITAMIN B-12) 1000 MCG TABS Take 1 tablet (1,000 mcg total) by mouth daily.    . DULoxetine (CYMBALTA) 20 MG capsule Take 20 mg by mouth daily.    . furosemide  (LASIX) 40 MG tablet Take 1 tablet (40 mg total) by mouth daily. (Patient taking differently: Take 40 mg by mouth daily as needed for fluid. ) 30 tablet 0  . ipratropium-albuterol (DUONEB) 0.5-2.5 (3) MG/3ML SOLN Take 3 mLs by nebulization 2 (two) times daily. 360 mL 0  . metoprolol succinate (TOPROL-XL) 25 MG 24 hr tablet Take 25 mg by mouth daily.    . Multiple Vitamin (MULTIVITAMIN) capsule Take 1 capsule by mouth daily.    . nicotine (NICODERM CQ - DOSED IN MG/24 HOURS) 14 mg/24hr patch Place 1 patch (14 mg total) onto the skin daily. 28 patch 0  . Nutritional Supplements (FEEDING SUPPLEMENT, KATE FARMS STANDARD 1.4,) LIQD liquid Take 325 mLs by mouth daily.    . pantoprazole (PROTONIX) 40 MG tablet Take 40 mg by mouth 2 (two) times daily.    [name] umeclidinium bromide (INCRUSE ELLIPTA) 62.5 MCG/INH AEPB Inhale 1 puff into the lungs daily. 7 each 0   No facility-administered medications prior to visit.      Review of Systems  Review of Systems   Physical Exam  There were no vitals taken for this visit. Physical Exam   Lab Results:  CBC    Component Value Date/Time   WBC 8.2 02/12/2020 0500   RBC 3.09 (L) 02/12/2020 0500   HGB 8.1 (L) 02/12/2020 0500   HCT 27.7 (L) 02/12/2020 0500   PLT 371 02/12/2020 0500   MCV 89.6 02/12/2020 0500   MCH 26.2 02/12/2020 0500   MCHC 29.2 (L) 02/12/2020 0500   RDW 16.9 (H) 02/12/2020 0500   LYMPHSABS 0.7 02/02/2020 0546   MONOABS 0.5 02/02/2020 0546   EOSABS 0.0 02/02/2020 0546   BASOSABS 0.0 02/02/2020 0546    BMET    Component Value Date/Time   NA 138 02/12/2020 0500   K 5.0 02/12/2020 0500   CL 108 02/12/2020 0500   CO2 24 02/12/2020 0500   GLUCOSE 87 02/12/2020 0500   BUN 54 (H) 02/12/2020 0500   CREATININE 1.85 (H) 02/12/2020 0500   CALCIUM 8.1 (L) 02/12/2020 0500   GFRNONAA 26 (L) 02/12/2020 0500   GFRAA 31 (L) 02/12/2020 0500    BNP    Component Value Date/Time   BNP 1,305.3 (H) 02/02/2020 0546    ProBNP No  results found for: PROBNP  Imaging: DG Chest 2 View  Result Date: 02/11/2020 CLINICAL DATA:  Acute pneumothorax EXAM: CHEST - 2 VIEW COMPARISON:  02/10/2020 FINDINGS: No visible pneumothorax currently. Increasing left basilar atelectasis or infiltrate with small left effusion. Right lung clear. Mild hyperinflation. Heart is normal size. No acute bony abnormality. IMPRESSION: No visible pneumothorax currently. Hyperinflation. Left base atelectasis or infiltrate with small left effusion, slightly increased since prior study. Electronically Signed   By: Rolm Baptise M.D.   On: 02/11/2020 08:31   DG Chest 2 View  Result Date: 02/01/2020 CLINICAL DATA:  Shortness of breath. EXAM: CHEST - 2 VIEW COMPARISON:  01/31/2020 FINDINGS: The cardiac silhouette, mediastinal and hilar contours are stable. Stable underlying chronic lung disease with emphysema and pulmonary scarring. Persistent left pleural effusion and overlying atelectasis and scarring. Stable vague rounded density in the left upper lobe. No definite acute pulmonary findings. IMPRESSION: Chronic lung disease with persistent left pleural effusion and overlying atelectasis and scarring. No definite acute overlying pulmonary process. Electronically Signed   By: Marijo Sanes M.D.   On: 02/01/2020 05:37   DG Chest 2 View  Result Date: 01/31/2020 CLINICAL DATA:  COPD exacerbation. EXAM: CHEST - 2 VIEW COMPARISON:  12/22/2019 FINDINGS: Left lung base opacity, similar to the prior exam, consistent with a small pleural effusion and associated lung base atelectasis or scarring. Lungs are hyperexpanded. There are chronic bronchitic changes at both lung bases, as well as bilateral interstitial thickening, findings are stable from the prior study. There is a nodular opacity that projects over the medial left upper lobe, which may be due to a combination of superimposed osseous structures, but raises possibility of a true lung nodule. This was not evident previously.  No pneumothorax. Cardiac silhouette is normal in size. No mediastinal or hilar masses or evidence of adenopathy. Skeletal structures are intact. IMPRESSION: 1. No convincing acute cardiopulmonary disease. 2. There are significant chronic findings including left lung base opacity that is consistent with a combination of pleural fluid and atelectasis or scarring, bilateral basilar bronchitic changes bilateral interstitial thickening. Lungs are hyperexpanded consistent with COPD. 3. Possible nodule in the medial left upper lobe. Recommend follow-up unenhanced chest CT on a nonemergent basis for further assessment. Electronically Signed   By: Lajean Manes M.D.   On: 01/31/2020 06:00   CT Chest Wo Contrast  Result Date: 02/02/2020 CLINICAL DATA:  shortness of breath and abnormal chest x-ray EXAM: CT CHEST WITHOUT CONTRAST TECHNIQUE: Multidetector CT imaging of the chest was performed following the standard protocol without IV contrast. COMPARISON:  10/17/2017 FINDINGS: Cardiovascular: Borderline heart size. No pericardial effusion. There is aortic and coronary atherosclerosis. No acute vascular finding without contrast Mediastinum/Nodes: Increase in size of mediastinal lymph nodes since 2018 but strictly still within normal limits at 15 mm or less in short axis, largest located at the precarinal station. Surgical clips around the GE junction, likely hernia repair. Progressive venous collaterals in the chest wall and upper mediastinum. The left brachiocephalic vein is collapsed. Lungs/Pleura: Small bilateral pleural effusion which is dependent and layering. There is streaky density with volume loss at the lung bases. Scar-like appearance with equivocal cylindrical bronchiectasis at the medial segment right middle lobe. Mild generalized interlobular septal thickening at the lung bases. Subpleural nodule in the left upper lobe measuring 16 mm, new from 2018 when it was only a tiny subpleural nodule. Upper Abdomen: No  acute finding. Musculoskeletal: No acute finding IMPRESSION: 1. 16 mm nodule in the subpleural left upper lobe, worrisome for bronchogenic carcinoma. Recommend referral to multi disciplinary thoracic oncology. 2. Small pleural effusions and mild interstitial edema. Atelectasis is seen at both lung bases. 3. Progressive collaterals in the chest wall associated with left brachiocephalic vein occlusion/collapse. This may account for mild interval enlargement of mediastinal lymph nodes. 4.  Aortic Atherosclerosis (ICD10-I70.0).  Coronary atherosclerosis. Electronically Signed   By: Monte Fantasia M.D.   On: 02/02/2020 07:36   DG Chest Port 1 View  Result Date: 02/10/2020 CLINICAL DATA:  Pneumothorax EXAM: PORTABLE CHEST 1 VIEW COMPARISON:  Radiograph 02/10/2020, CT chest 02/02/2020 FINDINGS: Chronic emphysematous and bronchitic features with coarsened interstitial changes  compatible with history of COPD. Streaky bibasilar areas of opacity, likely atelectasis. A small medial left apical pneumothorax is less well visualized on this examination, possibly resolved. Small left apical nodule is again seen. No visible effusion. Borderline cardiomegaly is similar to prior. No acute osseous or soft tissue abnormality. Degenerative changes are present in the imaged spine and shoulders. Telemetry leads overlie the chest. IMPRESSION: 1. A small medial left apical pneumothorax is less well visualized on this examination, possibly resolved. 2. Redemonstration of a medial left apical lung nodule seen on comparison CT. 3. Chronic emphysematous and bronchitic features with coarsened interstitial changes. Basilar atelectasis. Electronically Signed   By: Lovena Le M.D.   On: 02/10/2020 17:08   DG Chest Port 1 View  Result Date: 02/10/2020 CLINICAL DATA:  Post bronchoscopy with biopsy today EXAM: PORTABLE CHEST 1 VIEW COMPARISON:  Portable exam 1233 hours compared to 02/02/2020 Correlation CT chest 02/02/2020 FINDINGS:  Borderline enlargement of cardiac silhouette. Mediastinal contours and pulmonary vascularity normal. Atherosclerotic calcification aorta. Emphysematous and bronchitic changes consistent with COPD. Mild bibasilar atelectasis. Improved infiltrates/edema since previous exam. Tiny medial LEFT apical pneumothorax. No pleural effusions. LEFT upper lobe nodule identified on prior CT exam is poorly seen radiographically at the medial LEFT upper lobe. IMPRESSION: Tiny medial LEFT apical pneumothorax post bronchoscopy and biopsy. Underlying COPD changes with bibasilar atelectasis. Findings called to Kathlee Nations RN in PACU on 02/10/2020 at 1303 hours. Electronically Signed   By: Lavonia Dana M.D.   On: 02/10/2020 13:05   DG Chest Port 1 View  Result Date: 02/02/2020 CLINICAL DATA:  Shortness of breath EXAM: PORTABLE CHEST 1 VIEW COMPARISON:  Yesterday FINDINGS: Cardiomegaly with hazy and interstitial opacity. Small left pleural effusion. No pneumothorax. Postoperative left neck. IMPRESSION: Worsening interstitial opacity favoring edema. Small left pleural effusion. Electronically Signed   By: Monte Fantasia M.D.   On: 02/02/2020 05:52   ECHOCARDIOGRAM COMPLETE  Result Date: 02/04/2020    ECHOCARDIOGRAM REPORT   Patient Name:   TIDA SANER Date of Exam: 02/04/2020 Medical Rec #:  [id number]       Height:       64.0 in Accession #:    [id number]      Weight:       107.6 lb Date of Birth:  [birth year]        BSA:          1.503 m Patient Age:    33 years        BP:           119/68 mmHg Patient Gender: F               HR:           76 bpm. Exam Location:  Inpatient Procedure: 2D Echo Indications:    Congestive Heart Failure 428.0 / I50.9  History:        Patient has prior history of Echocardiogram examinations, most                 recent 11/04/2019. COPD; Risk Factors:Tobacco use, Hypertension,                 Diabetes and Dyslipidemia. AKI                 CKD.  Sonographer:    Vikki Ports Turrentine Referring Phys: [id number] RIPUDEEP K RAI  IMPRESSIONS  1. Left ventricular ejection fraction, by estimation, is 50 to 55%. The left ventricle has low normal function. The left  ventricle has no regional wall motion abnormalities. Left ventricular diastolic parameters are consistent with Grade I diastolic dysfunction (impaired relaxation).  2. Right ventricular systolic function is normal. The right ventricular size is normal. There is mildly elevated pulmonary artery systolic pressure. The estimated right ventricular systolic pressure is 02.7 mmHg.  3. The mitral valve is myxomatous. Mild to moderate mitral valve regurgitation. No evidence of mitral stenosis.  4. The aortic valve is normal in structure. Aortic valve regurgitation is not visualized. No aortic stenosis is present.  5. The inferior vena cava is normal in size with greater than 50% respiratory variability, suggesting right atrial pressure of 3 mmHg. Comparison(s): Prior images reviewed side by side. Mitral regurgitation has increased when compared to prior study. FINDINGS  Left Ventricle: Left ventricular ejection fraction, by estimation, is 50 to 55%. The left ventricle has low normal function. The left ventricle has no regional wall motion abnormalities. The left ventricular internal cavity size was normal in size. There is no left ventricular hypertrophy. Left ventricular diastolic parameters are consistent with Grade I diastolic dysfunction (impaired relaxation). Right Ventricle: The right ventricular size is normal. No increase in right ventricular wall thickness. Right ventricular systolic function is normal. There is mildly elevated pulmonary artery systolic pressure. The tricuspid regurgitant velocity is 2.74  m/s, and with an assumed right atrial pressure of 3 mmHg, the estimated right ventricular systolic pressure is 25.3 mmHg. Left Atrium: Left atrial size was normal in size. Right Atrium: Right atrial size was normal in size. Pericardium: There is no evidence of pericardial effusion.  Mitral Valve: The mitral valve is myxomatous. There is moderate thickening of the mitral valve leaflet(s). Normal mobility of the mitral valve leaflets. Mild to moderate mitral valve regurgitation. No evidence of mitral valve stenosis. Tricuspid Valve: The tricuspid valve is normal in structure. Tricuspid valve regurgitation is mild . No evidence of tricuspid stenosis. Aortic Valve: The aortic valve is normal in structure. Aortic valve regurgitation is not visualized. No aortic stenosis is present. Pulmonic Valve: The pulmonic valve was normal in structure. Pulmonic valve regurgitation is not visualized. No evidence of pulmonic stenosis. Aorta: The aortic root is normal in size and structure. Venous: The inferior vena cava is normal in size with greater than 50% respiratory variability, suggesting right atrial pressure of 3 mmHg. IAS/Shunts: No atrial level shunt detected by color flow Doppler.  LEFT VENTRICLE PLAX 2D LVIDd:         4.50 cm  Diastology LVIDs:         3.30 cm  LV e' lateral:   6.09 cm/s LV PW:         0.90 cm  LV E/e' lateral: 17.4 LV IVS:        0.90 cm  LV e' medial:    6.09 cm/s LVOT diam:     1.90 cm  LV E/e' medial:  17.4 LV SV:         50 LV SV Index:   34 LVOT Area:     2.84 cm  RIGHT VENTRICLE RV S prime:     12.80 cm/s TAPSE (M-mode): 2.0 cm LEFT ATRIUM             Index       RIGHT ATRIUM           Index LA diam:        3.10 cm 2.06 cm/m  RA Area:     12.20 cm LA Vol (A2C):   40.8 ml 27.14  ml/m RA Volume:   27.50 ml  18.29 ml/m LA Vol (A4C):   39.7 ml 26.41 ml/m LA Biplane Vol: 41.0 ml 27.28 ml/m  AORTIC VALVE LVOT Vmax:   86.80 cm/s LVOT Vmean:  67.800 cm/s LVOT VTI:    0.178 m  AORTA Ao Root diam: 2.60 cm MITRAL VALVE                TRICUSPID VALVE MV Area (PHT): 3.83 cm     TR Peak grad:   30.0 mmHg MV Decel Time: 198 msec     TR Vmax:        274.00 cm/s MV E velocity: 106.00 cm/s MV A velocity: 117.00 cm/s  SHUNTS MV E/A ratio:  0.91         Systemic VTI:  0.18 m                              Systemic Diam: 1.90 cm Candee Furbish MD Electronically signed by Candee Furbish MD Signature Date/Time: 02/04/2020/11:53:29 AM    Final    DG C-ARM BRONCHOSCOPY  Result Date: 02/10/2020 C-ARM BRONCHOSCOPY: Fluoroscopy was utilized by the requesting physician.  No radiographic interpretation.     Assessment & Plan:   No problem-specific Assessment & Plan notes found for this encounter.     Martyn Ehrich, NP 02/19/2020

## 2020-02-19 NOTE — Telephone Encounter (Signed)
Thank you for seeing her. I agree that she needs a PET. Lets go ahead and get it now. I think we should consider referring her to [facility] since the nodule has grown and we got suspicious pathology. They may be willing to treat her based on what we've done so far. Or they may find something on PET that is easier to biopsy.

## 2020-02-22 ENCOUNTER — Ambulatory Visit: Payer: Medicare HMO | Admitting: Physician Assistant

## 2020-02-23 NOTE — Progress Notes (Signed)
Cardiology Office Note    Date:  02/24/2020   ID:  [name], DOB 20-Jul-1946, MRN [id number]  PCP:  [name], MD  Cardiologist: Fransico Him, MD EPS: None  Chief Complaint  Patient presents with  . Hospitalization Follow-up    History of Present Illness:  [name] is a 74 y.o. female history of OSA, PAD, Parkinsons, HTN, chronic diastolic CHF COPD, chronic pain.  Patient had acute on chronic diastolic CHF 04/7823 and mild elevation of troponins in setting of heart failure no ischemic testing recommended.  Echo 02/04/2020 normal LVEF 50 to 55% with grade 1 DD   Patient was seen in the hospital by Dr. Radford Pax 02/04/2020 for third COPD exacerbation was found to be anemic with hemoglobin 8.3 creatinine 1.54 BNP of 1305.  Patient continued to smoke.  She had persistent left pleural effusion with chronic lung disease.  Patient's troponins were mildly elevated in the setting of severe COPD and some fluid overload.  Chest CT showed some coronary calcifications but also lesion concerning for bronchiogenic cancer.  Cannot do coronary CTA due to AKI and Lexiscan is recommended eventually but her lung function need to improve prior to doing this and cleared from pulmonary standpoint.  Patient comes in for f/u. Has very little memory of hospitalization and doesn't know if she's been vaccinated for covid 19. Denies chest pain. Smoking 1 ppd if she can afford it. Says she can't quit and won't.She's in a rehab facility. Denies swelling or shortness of breath. Asking for oxycodone for her back pain.   Past Medical History:  Diagnosis Date  . Anemia   . Anxiety and depression   . Arthritis   . Asthma   . Carotid artery occlusion   . Chronic pain    leg and feet  . CKD (chronic kidney disease), stage IV ([place])   . COPD (chronic obstructive pulmonary disease) ([place])   . Coronary artery disease   . DDD (degenerative disc disease)   . Diarrhea    chronic   . DVT (deep venous  thrombosis) ([place])   . Dyspnea   . Fibromyalgia   . GERD (gastroesophageal reflux disease)   . Grave's disease   . Headache   . HOH (hard of hearing)   . Hypertension   . Lung nodule   . Mitral regurgitation   . OP (osteoporosis)   . Parkinson's disease ([place])   . Peripheral vascular disease ([place])   . Protein calorie malnutrition ([place])   . PUD (peptic ulcer disease)   . Recurrent upper respiratory infection (URI)   . Rhinitis   . Sleep apnea   . Spinal stenosis of lumbar region 04/23/2013  . Thrombophlebitis   . Tobacco use disorder 04/23/2013  . Vitamin B 12 deficiency     Past Surgical History:  Procedure Laterality Date  . CAROTID ENDARTERECTOMY Left Sept. 20,2011   cea  . CHOLECYSTECTOMY  1999  . GASTRECTOMY     age 7   Part of small intestin and part of stomach  . LEFT HEART CATHETERIZATION WITH CORONARY ANGIOGRAM N/A 10/02/2011   Procedure: LEFT HEART CATHETERIZATION WITH CORONARY ANGIOGRAM;  Surgeon: Sueanne Margarita, MD;  Location: [facility];  Service: Cardiovascular;  Laterality: N/A;  . VIDEO BRONCHOSCOPY WITH ENDOBRONCHIAL NAVIGATION N/A 02/10/2020   Procedure: Video Bronchoscopy With Endobronchial Navigation;  Surgeon: Collene Gobble, MD;  Location: [facility];  Service: Thoracic;  Laterality: N/A;  . VIDEO BRONCHOSCOPY WITH ENDOBRONCHIAL ULTRASOUND N/A 02/10/2020  Procedure: VIDEO BRONCHOSCOPY WITH ENDOBRONCHIAL ULTRASOUND;  Surgeon: Collene Gobble, MD;  Location: [facility] OR;  Service: Thoracic;  Laterality: N/A;    Current Medications: Current Meds  Medication Sig  . albuterol (PROVENTIL) (2.5 MG/3ML) 0.083% nebulizer solution Inhale 3 mLs into the lungs 2 (two) times daily.  [name] albuterol (VENTOLIN HFA) 108 (90 Base) MCG/ACT inhaler Inhale 2 puffs into the lungs every 6 (six) hours as needed for wheezing or shortness of breath.  . allopurinol (ZYLOPRIM) 100 MG tablet Take 1 tablet (100 mg total) by mouth daily.  [name] amLODipine (NORVASC) 10 MG tablet Take 1 tablet (10 mg  total) by mouth daily.  [name] arformoterol (BROVANA) 15 MCG/2ML NEBU Take 2 mLs (15 mcg total) by nebulization 2 (two) times daily.  [name] atorvastatin (LIPITOR) 20 MG tablet Take 1 tablet (20 mg total) by mouth at bedtime.  . benzonatate (TESSALON) 100 MG capsule Take 1 capsule (100 mg total) by mouth 3 (three) times daily as needed for cough.  . budesonide (PULMICORT) 0.5 MG/2ML nebulizer solution Take 2 mLs (0.5 mg total) by nebulization 2 (two) times daily.  . Cyanocobalamin (VITAMIN B-12) 1000 MCG TABS Take 1 tablet (1,000 mcg total) by mouth daily.  . DULoxetine (CYMBALTA) 20 MG capsule Take 20 mg by mouth daily.  . furosemide (LASIX) 40 MG tablet Take 1 tablet (40 mg total) by mouth daily.  [name] ipratropium-albuterol (DUONEB) 0.5-2.5 (3) MG/3ML SOLN Take 3 mLs by nebulization 2 (two) times daily.  . metoprolol succinate (TOPROL-XL) 25 MG 24 hr tablet Take 25 mg by mouth daily.  . Multiple Vitamin (MULTIVITAMIN) capsule Take 1 capsule by mouth daily.  . nicotine (NICODERM CQ - DOSED IN MG/24 HOURS) 14 mg/24hr patch Place 1 patch (14 mg total) onto the skin daily.  . Nutritional Supplements (FEEDING SUPPLEMENT, KATE FARMS STANDARD 1.4,) LIQD liquid Take 325 mLs by mouth daily.  . pantoprazole (PROTONIX) 40 MG tablet Take 40 mg by mouth 2 (two) times daily.  [name] umeclidinium bromide (INCRUSE ELLIPTA) 62.5 MCG/INH AEPB Inhale 1 puff into the lungs daily.     Allergies:   Patient has no known allergies.   Social History   Socioeconomic History  . Marital status: Widowed    Spouse name: Not on file  . Number of children: 1  . Years of education: Not on file  . Highest education level: Not on file  Occupational History  . Occupation: retired  Tobacco Use  . Smoking status: Current Every Day Smoker    Packs/day: 2.00    Years: 50.00    Pack years: 100.00    Types: Cigarettes  . Smokeless tobacco: Never Used  . Tobacco comment: cessation info given and reviewed  Substance and Sexual Activity   . Alcohol use: No    Alcohol/week: 0.0 standard drinks  . Drug use: No  . Sexual activity: Not Currently    Birth control/protection: Post-menopausal  Other Topics Concern  . Not on file  Social History Narrative  . Not on file   Social Determinants of Health   Financial Resource Strain: High Risk  . Difficulty of Paying Living Expenses: Very hard  Food Insecurity: Food Insecurity Present  . Worried About Charity fundraiser in the Last Year: Sometimes true  . Ran Out of Food in the Last Year: Never true  Transportation Needs: No Transportation Needs  . Lack of Transportation (Medical): No  . Lack of Transportation (Non-Medical): No  Physical Activity:   . Days of Exercise  per Week:   . Minutes of Exercise per Session:   Stress: Stress Concern Present  . Feeling of Stress : To some extent  Social Connections: Moderately Isolated  . Frequency of Communication with Friends and Family: Three times a week  . Frequency of Social Gatherings with Friends and Family: Once a week  . Attends Religious Services: Never  . Active Member of Clubs or Organizations: No  . Attends Archivist Meetings: Never  . Marital Status: Widowed     Family History:  The patient's   family history includes Diabetes in her father, mother, sister, and sister; Heart attack in her father, mother, and sister; Heart disease in her father, mother, and sister; Hyperlipidemia in her father, mother, sister, and sister; Hypertension in her father, mother, sister, and sister; Other in her father, mother, and sister; Peripheral vascular disease in her sister; Varicose Veins in her father, mother, sister, and sister.   ROS:   Please see the history of present illness.    ROS All other systems reviewed and are negative.   PHYSICAL EXAM:   VS:  BP (!) 132/54   Pulse 70   Ht 5\' 4"  (1.626 m)   Wt 104 lb 8 oz (47.4 kg)   SpO2 (!) 70%   BMI 17.94 kg/m   Physical Exam  GEN: Thin, elderly, in no acute  distress  Neck: no JVD, carotid bruits, or masses Cardiac:RRR; 1/6 systolic murmur at the apex Respiratory: Decreased breath sounds throughout with fine crackles  GI: soft, nontender, nondistended, + BS Ext: without cyanosis, clubbing, or edema, Good distal pulses bilaterally Neuro:  Alert and Oriented x 3 Psych: euthymic mood, full affect  Wt Readings from Last 3 Encounters:  02/24/20 104 lb 8 oz (47.4 kg)  02/11/20 107 lb (48.5 kg)  02/01/20 106 lb 14.8 oz (48.5 kg)      Studies/Labs Reviewed:   EKG:  EKG is not ordered today.    Recent Labs: 11/20/2019: TSH 0.392 02/02/2020: B Natriuretic Peptide 1,305.3; Magnesium 2.5 02/03/2020: ALT 25 02/12/2020: BUN 54; Creatinine, Ser 1.85; Hemoglobin 8.1; Platelets 371; Potassium 5.0; Sodium 138   Lipid Panel    Component Value Date/Time   CHOL 145 11/05/2019 0458   TRIG 90 11/05/2019 0458   HDL 64 11/05/2019 0458   CHOLHDL 2.3 11/05/2019 0458   VLDL 18 11/05/2019 0458   LDLCALC 63 11/05/2019 0458    Additional studies/ records that were reviewed today include:  Chest CT 4/6/2021CT Chest Wo Contrast   Result Date: 02/02/2020 CLINICAL DATA:  shortness of breath and abnormal chest x-ray EXAM: CT CHEST WITHOUT CONTRAST TECHNIQUE: Multidetector CT imaging of the chest was performed following the standard protocol without IV contrast. COMPARISON:  10/17/2017 FINDINGS: Cardiovascular: Borderline heart size. No pericardial effusion. There is aortic and coronary atherosclerosis. No acute vascular finding without contrast Mediastinum/Nodes: Increase in size of mediastinal lymph nodes since 2018 but strictly still within normal limits at 15 mm or less in short axis, largest located at the precarinal station. Surgical clips around the GE junction, likely hernia repair. Progressive venous collaterals in the chest wall and upper mediastinum. The left brachiocephalic vein is collapsed. Lungs/Pleura: Small bilateral pleural effusion which is dependent and  layering. There is streaky density with volume loss at the lung bases. Scar-like appearance with equivocal cylindrical bronchiectasis at the medial segment right middle lobe. Mild generalized interlobular septal thickening at the lung bases. Subpleural nodule in the left upper lobe measuring 16 mm, new from  2018 when it was only a tiny subpleural nodule. Upper Abdomen: No acute finding. Musculoskeletal: No acute finding IMPRESSION: 1. 16 mm nodule in the subpleural left upper lobe, worrisome for bronchogenic carcinoma. Recommend referral to multi disciplinary thoracic oncology. 2. Small pleural effusions and mild interstitial edema. Atelectasis is seen at both lung bases. 3. Progressive collaterals in the chest wall associated with left brachiocephalic vein occlusion/collapse. This may account for mild interval enlargement of mediastinal lymph nodes. 4.  Aortic Atherosclerosis (ICD10-I70.0).  Coronary atherosclerosis. Electronically Signed   By: Monte Fantasia M.D.   On: 02/02/2020 07:36    Echo 11/04/19  1. Left ventricular ejection fraction, by visual estimation, is 60 to  65%. The left ventricle has normal function. There is moderately increased  left ventricular hypertrophy.   2. Left ventricular diastolic parameters are indeterminate.   3. Global right ventricle has normal systolic function.The right  ventricular size is normal.   4. Left atrial size was normal.   5. Right atrial size was normal.   6. The mitral valve is normal in structure. Trivial mitral valve  regurgitation.   7. The tricuspid valve is normal in structure.   8. The aortic valve is tricuspid. Aortic valve regurgitation is not  visualized. No evidence of aortic valve sclerosis or stenosis.   9. The pulmonic valve was not well visualized. Pulmonic valve  regurgitation is not visualized.  10. The inferior vena cava is dilated in size with >50% respiratory  variability, suggesting right atrial pressure of 8 mmHg.  11. The  tricuspid regurgitant velocity is 2.53 m/s, and with an assumed  right atrial pressure of 8 mmHg, the estimated right ventricular systolic  pressure is mildly elevated at 33.6 mmHg.        ASSESSMENT:    1. Chronic diastolic CHF (congestive heart failure) ([place])   2. Coronary artery calcification seen on CT scan   3. Essential hypertension   4. Stage 3b chronic kidney disease   5. Mitral valve insufficiency, unspecified etiology   6. Hyperlipidemia, unspecified hyperlipidemia type   7. Chronic obstructive pulmonary disease, unspecified COPD type ([place])      PLAN:  In order of problems listed above:  Chronic diastolic CHF Echo 02/7845 normal LVEF grade 1 DD compensated  Mild to moderate MR  Elevated troponins in the setting of heart failure and coronary calcification noted on CT scan.  Cannot do coronary CT because of CKD and need pulmonary to improve before doing Lexiscan-patient asymptomatic for chest pain and currently in a rehab facility.  Still anemic with hemoglobin of 8.1 02/12/2020.  Will not schedule Lexiscan as she is asymptomatic.  Follow-up with Dr. Radford Pax in 3 months.  Hypertension controlled on Toprol and Norvasc  CKD stage IV creatinine 1.85 02/12/2020  Hyperlipidemia on atorvastatin LDL 63 10/2019  COPD per pulmonary  Medication Adjustments/Labs and Tests Ordered: Current medicines are reviewed at length with the patient today.  Concerns regarding medicines are outlined above.  Medication changes, Labs and Tests ordered today are listed in the Patient Instructions below. Patient Instructions  Medication Instructions:  Your physician recommends that you continue on your current medications as directed. Please refer to the Current Medication list given to you today.  *If you need a refill on your cardiac medications before your next appointment, please call your pharmacy*   Lab Work: None ordered  If you have labs (blood work) drawn today and your tests are  completely normal, you will receive your results only by: [name]  MyChart Message (if you have MyChart) OR . A paper copy in the mail If you have any lab test that is abnormal or we need to change your treatment, we will call you to review the results.   Testing/Procedures: None ordered  Follow-Up: Follow up with Dr. Radford Pax on 05/16/20 at 11:00 AM  Other Instructions      Signed, Ermalinda Barrios, PA-C  02/24/2020 12:07 PM    [name], [place], [place]  [address] Phone: [phone]; Fax: [phone]

## 2020-02-24 ENCOUNTER — Other Ambulatory Visit: Payer: Self-pay

## 2020-02-24 ENCOUNTER — Encounter: Payer: Self-pay | Admitting: Physician Assistant

## 2020-02-24 ENCOUNTER — Ambulatory Visit (INDEPENDENT_AMBULATORY_CARE_PROVIDER_SITE_OTHER): Payer: Medicare HMO | Admitting: Physician Assistant

## 2020-02-24 VITALS — BP 132/54 | HR 70 | Ht 64.0 in | Wt 104.5 lb

## 2020-02-24 DIAGNOSIS — E785 Hyperlipidemia, unspecified: Secondary | ICD-10-CM

## 2020-02-24 DIAGNOSIS — I2510 Atherosclerotic heart disease of native coronary artery without angina pectoris: Secondary | ICD-10-CM

## 2020-02-24 DIAGNOSIS — I340 Nonrheumatic mitral (valve) insufficiency: Secondary | ICD-10-CM

## 2020-02-24 DIAGNOSIS — N1832 Chronic kidney disease, stage 3b: Secondary | ICD-10-CM

## 2020-02-24 DIAGNOSIS — J449 Chronic obstructive pulmonary disease, unspecified: Secondary | ICD-10-CM

## 2020-02-24 DIAGNOSIS — I10 Essential (primary) hypertension: Secondary | ICD-10-CM

## 2020-02-24 DIAGNOSIS — I5032 Chronic diastolic (congestive) heart failure: Secondary | ICD-10-CM

## 2020-02-24 NOTE — Patient Instructions (Signed)
Medication Instructions:  Your physician recommends that you continue on your current medications as directed. Please refer to the Current Medication list given to you today.  *If you need a refill on your cardiac medications before your next appointment, please call your pharmacy*   Lab Work: None ordered  If you have labs (blood work) drawn today and your tests are completely normal, you will receive your results only by: [name] MyChart Message (if you have MyChart) OR . A paper copy in the mail If you have any lab test that is abnormal or we need to change your treatment, we will call you to review the results.   Testing/Procedures: None ordered  Follow-Up: Follow up with Dr. Radford Pax on 05/16/20 at 11:00 AM  Other Instructions

## 2020-03-17 DIAGNOSIS — J449 Chronic obstructive pulmonary disease, unspecified: Secondary | ICD-10-CM | POA: Diagnosis not present

## 2020-03-17 DIAGNOSIS — J9611 Chronic respiratory failure with hypoxia: Secondary | ICD-10-CM | POA: Diagnosis not present

## 2020-03-17 DIAGNOSIS — I5030 Unspecified diastolic (congestive) heart failure: Secondary | ICD-10-CM | POA: Diagnosis not present

## 2020-03-17 DIAGNOSIS — N183 Chronic kidney disease, stage 3 unspecified: Secondary | ICD-10-CM | POA: Diagnosis not present

## 2020-04-14 DIAGNOSIS — N183 Chronic kidney disease, stage 3 unspecified: Secondary | ICD-10-CM | POA: Diagnosis not present

## 2020-04-14 DIAGNOSIS — I5030 Unspecified diastolic (congestive) heart failure: Secondary | ICD-10-CM | POA: Diagnosis not present

## 2020-04-14 DIAGNOSIS — J9611 Chronic respiratory failure with hypoxia: Secondary | ICD-10-CM | POA: Diagnosis not present

## 2020-04-14 DIAGNOSIS — J449 Chronic obstructive pulmonary disease, unspecified: Secondary | ICD-10-CM | POA: Diagnosis not present

## 2020-04-21 DIAGNOSIS — E7800 Pure hypercholesterolemia, unspecified: Secondary | ICD-10-CM | POA: Diagnosis not present

## 2020-04-21 DIAGNOSIS — Z79899 Other long term (current) drug therapy: Secondary | ICD-10-CM | POA: Diagnosis not present

## 2020-04-21 DIAGNOSIS — Z1331 Encounter for screening for depression: Secondary | ICD-10-CM | POA: Diagnosis not present

## 2020-04-21 DIAGNOSIS — I10 Essential (primary) hypertension: Secondary | ICD-10-CM | POA: Diagnosis not present

## 2020-04-21 DIAGNOSIS — Z1339 Encounter for screening examination for other mental health and behavioral disorders: Secondary | ICD-10-CM | POA: Diagnosis not present

## 2020-04-21 DIAGNOSIS — E559 Vitamin D deficiency, unspecified: Secondary | ICD-10-CM | POA: Diagnosis not present

## 2020-04-21 DIAGNOSIS — G8929 Other chronic pain: Secondary | ICD-10-CM | POA: Diagnosis not present

## 2020-04-21 DIAGNOSIS — M5416 Radiculopathy, lumbar region: Secondary | ICD-10-CM | POA: Diagnosis not present

## 2020-04-21 DIAGNOSIS — F17210 Nicotine dependence, cigarettes, uncomplicated: Secondary | ICD-10-CM | POA: Diagnosis not present

## 2020-04-21 DIAGNOSIS — Z1159 Encounter for screening for other viral diseases: Secondary | ICD-10-CM | POA: Diagnosis not present

## 2020-05-16 ENCOUNTER — Ambulatory Visit: Payer: Medicare HMO | Admitting: Cardiology

## 2020-05-19 DIAGNOSIS — J9611 Chronic respiratory failure with hypoxia: Secondary | ICD-10-CM | POA: Diagnosis not present

## 2020-05-19 DIAGNOSIS — J449 Chronic obstructive pulmonary disease, unspecified: Secondary | ICD-10-CM | POA: Diagnosis not present

## 2020-05-19 DIAGNOSIS — I5030 Unspecified diastolic (congestive) heart failure: Secondary | ICD-10-CM | POA: Diagnosis not present

## 2020-05-19 DIAGNOSIS — N183 Chronic kidney disease, stage 3 unspecified: Secondary | ICD-10-CM | POA: Diagnosis not present

## 2020-06-23 DIAGNOSIS — N183 Chronic kidney disease, stage 3 unspecified: Secondary | ICD-10-CM | POA: Diagnosis not present

## 2020-06-23 DIAGNOSIS — J449 Chronic obstructive pulmonary disease, unspecified: Secondary | ICD-10-CM | POA: Diagnosis not present

## 2020-06-23 DIAGNOSIS — J9611 Chronic respiratory failure with hypoxia: Secondary | ICD-10-CM | POA: Diagnosis not present

## 2020-06-23 DIAGNOSIS — I5030 Unspecified diastolic (congestive) heart failure: Secondary | ICD-10-CM | POA: Diagnosis not present

## 2020-08-01 DIAGNOSIS — Z20822 Contact with and (suspected) exposure to covid-19: Secondary | ICD-10-CM | POA: Diagnosis not present

## 2020-08-04 DIAGNOSIS — I5030 Unspecified diastolic (congestive) heart failure: Secondary | ICD-10-CM | POA: Diagnosis not present

## 2020-08-04 DIAGNOSIS — E1122 Type 2 diabetes mellitus with diabetic chronic kidney disease: Secondary | ICD-10-CM | POA: Diagnosis not present

## 2020-08-04 DIAGNOSIS — E46 Unspecified protein-calorie malnutrition: Secondary | ICD-10-CM | POA: Diagnosis not present

## 2020-08-04 DIAGNOSIS — J449 Chronic obstructive pulmonary disease, unspecified: Secondary | ICD-10-CM | POA: Diagnosis not present

## 2020-08-04 DIAGNOSIS — G8929 Other chronic pain: Secondary | ICD-10-CM | POA: Diagnosis not present

## 2020-08-04 DIAGNOSIS — G20 Parkinson's disease: Secondary | ICD-10-CM | POA: Diagnosis not present

## 2020-08-04 DIAGNOSIS — J9611 Chronic respiratory failure with hypoxia: Secondary | ICD-10-CM | POA: Diagnosis not present

## 2020-08-04 DIAGNOSIS — N183 Chronic kidney disease, stage 3 unspecified: Secondary | ICD-10-CM | POA: Diagnosis not present

## 2020-08-04 DIAGNOSIS — I739 Peripheral vascular disease, unspecified: Secondary | ICD-10-CM | POA: Diagnosis not present

## 2020-08-22 DIAGNOSIS — Z20822 Contact with and (suspected) exposure to covid-19: Secondary | ICD-10-CM | POA: Diagnosis not present

## 2020-08-29 DIAGNOSIS — Z20828 Contact with and (suspected) exposure to other viral communicable diseases: Secondary | ICD-10-CM | POA: Diagnosis not present

## 2020-09-04 ENCOUNTER — Other Ambulatory Visit: Payer: Self-pay

## 2020-09-04 ENCOUNTER — Emergency Department (HOSPITAL_COMMUNITY)
Admission: EM | Admit: 2020-09-04 | Discharge: 2020-09-04 | Disposition: A | Discharge status: Skilled Nursing Facility | Payer: Medicare HMO | Attending: Emergency Medicine | Admitting: Emergency Medicine

## 2020-09-04 DIAGNOSIS — J441 Chronic obstructive pulmonary disease with (acute) exacerbation: Secondary | ICD-10-CM | POA: Diagnosis not present

## 2020-09-04 DIAGNOSIS — E1122 Type 2 diabetes mellitus with diabetic chronic kidney disease: Secondary | ICD-10-CM | POA: Insufficient documentation

## 2020-09-04 DIAGNOSIS — Z79899 Other long term (current) drug therapy: Secondary | ICD-10-CM | POA: Diagnosis not present

## 2020-09-04 DIAGNOSIS — I130 Hypertensive heart and chronic kidney disease with heart failure and stage 1 through stage 4 chronic kidney disease, or unspecified chronic kidney disease: Secondary | ICD-10-CM | POA: Insufficient documentation

## 2020-09-04 DIAGNOSIS — F17210 Nicotine dependence, cigarettes, uncomplicated: Secondary | ICD-10-CM | POA: Diagnosis not present

## 2020-09-04 DIAGNOSIS — I5033 Acute on chronic diastolic (congestive) heart failure: Secondary | ICD-10-CM | POA: Diagnosis not present

## 2020-09-04 DIAGNOSIS — Z743 Need for continuous supervision: Secondary | ICD-10-CM | POA: Diagnosis not present

## 2020-09-04 DIAGNOSIS — H409 Unspecified glaucoma: Secondary | ICD-10-CM | POA: Insufficient documentation

## 2020-09-04 DIAGNOSIS — H5711 Ocular pain, right eye: Secondary | ICD-10-CM | POA: Diagnosis present

## 2020-09-04 DIAGNOSIS — R6889 Other general symptoms and signs: Secondary | ICD-10-CM | POA: Diagnosis not present

## 2020-09-04 DIAGNOSIS — N1831 Chronic kidney disease, stage 3a: Secondary | ICD-10-CM | POA: Diagnosis not present

## 2020-09-04 DIAGNOSIS — R52 Pain, unspecified: Secondary | ICD-10-CM | POA: Diagnosis not present

## 2020-09-04 DIAGNOSIS — H579 Unspecified disorder of eye and adnexa: Secondary | ICD-10-CM | POA: Diagnosis not present

## 2020-09-04 DIAGNOSIS — R609 Edema, unspecified: Secondary | ICD-10-CM | POA: Diagnosis not present

## 2020-09-04 DIAGNOSIS — E1140 Type 2 diabetes mellitus with diabetic neuropathy, unspecified: Secondary | ICD-10-CM | POA: Diagnosis not present

## 2020-09-04 DIAGNOSIS — Z7401 Bed confinement status: Secondary | ICD-10-CM | POA: Diagnosis not present

## 2020-09-04 DIAGNOSIS — H5710 Ocular pain, unspecified eye: Secondary | ICD-10-CM | POA: Diagnosis not present

## 2020-09-04 DIAGNOSIS — H40001 Preglaucoma, unspecified, right eye: Secondary | ICD-10-CM | POA: Diagnosis not present

## 2020-09-04 DIAGNOSIS — M2550 Pain in unspecified joint: Secondary | ICD-10-CM | POA: Diagnosis not present

## 2020-09-04 MED ORDER — TETRACAINE HCL 0.5 % OP SOLN
2.0000 [drp] | Freq: Once | OPHTHALMIC | Status: AC
  Administered 2020-09-04: 2 [drp] via OPHTHALMIC
  Filled 2020-09-04: qty 4

## 2020-09-04 MED ORDER — DORZOLAMIDE HCL-TIMOLOL MAL 2-0.5 % OP SOLN
1.0000 [drp] | Freq: Three times a day (TID) | OPHTHALMIC | Status: DC
  Administered 2020-09-04 (×2): 1 [drp] via OPHTHALMIC
  Filled 2020-09-04: qty 10

## 2020-09-04 MED ORDER — ONDANSETRON 4 MG PO TBDP
4.0000 mg | ORAL_TABLET | Freq: Once | ORAL | Status: AC
  Administered 2020-09-04: 4 mg via ORAL
  Filled 2020-09-04: qty 1

## 2020-09-04 MED ORDER — OXYCODONE-ACETAMINOPHEN 5-325 MG PO TABS
1.0000 | ORAL_TABLET | Freq: Once | ORAL | Status: AC
  Administered 2020-09-04: 1 via ORAL
  Filled 2020-09-04: qty 1

## 2020-09-04 MED ORDER — FLUORESCEIN SODIUM 1 MG OP STRP
1.0000 | ORAL_STRIP | Freq: Once | OPHTHALMIC | Status: AC
  Administered 2020-09-04: 1 via OPHTHALMIC
  Filled 2020-09-04: qty 1

## 2020-09-04 MED ORDER — TIMOLOL MALEATE 0.5 % OP SOLN
1.0000 [drp] | Freq: Once | OPHTHALMIC | Status: DC
  Filled 2020-09-04: qty 5

## 2020-09-04 MED ORDER — PILOCARPINE HCL 2 % OP SOLN
1.0000 [drp] | Freq: Once | OPHTHALMIC | Status: AC
  Administered 2020-09-04: 1 [drp] via OPHTHALMIC
  Filled 2020-09-04: qty 15

## 2020-09-04 MED ORDER — FENTANYL CITRATE (PF) 100 MCG/2ML IJ SOLN
50.0000 ug | Freq: Once | INTRAMUSCULAR | Status: AC
  Administered 2020-09-04: 50 ug via INTRAMUSCULAR
  Filled 2020-09-04: qty 2

## 2020-09-04 MED ORDER — ACETAZOLAMIDE 250 MG PO TABS
500.0000 mg | ORAL_TABLET | Freq: Once | ORAL | Status: AC
  Administered 2020-09-04: 500 mg via ORAL
  Filled 2020-09-04: qty 2

## 2020-09-04 NOTE — Discharge Instructions (Addendum)
You have elevated pressure inside your eye tonight.  The pressures were 53 initially and then 51 at time of discharge.  Use the Cosopt eyedrops 3 times a day in the right eye, use the pilocarpine eyedrops 4 times a day in the right eye.  You need to be evaluated tomorrow, November 8 by an ophthalmologist.  I talked to Dr. Sharen Counter about you tonight however he recommends that you see your ophthalmologist that you saw in the past or see [facility] retina physicians.

## 2020-09-04 NOTE — ED Provider Notes (Signed)
[facility] DEPT Provider Note   CSN: [id number] Arrival date & time: 09/04/20  0208   Time seen 2:46 AM  History Chief Complaint  Patient presents with  . Eye Pain    [name] is a 74 y.o. female.  HPI Patient states she has had right eye pain for about a week.  She states she has been blind in that eye for 1 to 2 years.  She states she was getting injections in that eye but she does not know why or the ophthalmologist name.  She has been treated with tobramycin drops all week without improvement.  She states now she has pain in the right side of her head and into her ear from the eye hurting.  PCP [name], MD     Past Medical History:  Diagnosis Date  . Anemia   . Anxiety and depression   . Arthritis   . Asthma   . Carotid artery occlusion   . Chronic pain    leg and feet  . CKD (chronic kidney disease), stage IV ([place])   . COPD (chronic obstructive pulmonary disease) ([place])   . Coronary artery disease   . DDD (degenerative disc disease)   . Diarrhea    chronic   . DVT (deep venous thrombosis) ([place])   . Dyspnea   . Fibromyalgia   . GERD (gastroesophageal reflux disease)   . Grave's disease   . Headache   . HOH (hard of hearing)   . Hypertension   . Lung nodule   . Mitral regurgitation   . OP (osteoporosis)   . Parkinson's disease ([place])   . Peripheral vascular disease ([place])   . Protein calorie malnutrition ([place])   . PUD (peptic ulcer disease)   . Recurrent upper respiratory infection (URI)   . Rhinitis   . Sleep apnea   . Spinal stenosis of lumbar region 04/23/2013  . Thrombophlebitis   . Tobacco use disorder 04/23/2013  . Vitamin B 12 deficiency     Patient Active Problem List   Diagnosis Date Noted  . S/P bronchoscopy with biopsy   . SOB (shortness of breath)   . Coronary artery calcification seen on CAT scan   . Acute respiratory failure with hypoxemia ([place]) 02/02/2020  . Tremor 02/02/2020  . Vitamin  B 12 deficiency   . Malnutrition of moderate degree 12/24/2019  . Acute on chronic diastolic CHF (congestive heart failure) ([place]) 12/21/2019  . CKD (chronic kidney disease) stage 3, GFR 30-59 ml/min (HCC) 12/21/2019  . Hypertensive urgency 11/20/2019  . Flash pulmonary edema ([place])   . Positive D dimer   . Acute respiratory failure with hypercapnia ([place]) 11/04/2019  . Acute exacerbation of CHF (congestive heart failure) ([place]) 11/04/2019  . DNR (do not resuscitate) 11/04/2019  . Chronic pain syndrome 11/04/2019  . Pulmonary nodule, left 08/02/2018  . Major depressive disorder, single episode, moderate ([place]) 08/02/2018  . Benzodiazepine overdose of undetermined intent 08/01/2018  . Cellulitis 05/14/2018  . Anemia 05/14/2018  . At risk for adverse drug event 10/17/2017  . Salicylate intoxication, undetermined intent, subsequent encounter 10/10/2017  . Acute renal failure with acute renal cortical necrosis superimposed on stage 3a chronic kidney disease ([place]) 10/10/2017  . Headache 10/10/2017  . Severe protein-calorie malnutrition ([place]) 01/26/2016  . Opiate overdose ([place]) 01/25/2016  . Suicide attempt ([place]) 01/25/2016  . Depression   . Dyslipidemia   . Gastroesophageal reflux disease without esophagitis   . COPD (chronic obstructive pulmonary  disease) ([place]) 07/29/2015  . Diabetes mellitus with neurological manifestation ([place]) 07/29/2015  . Frequent falls 07/29/2015  . HTN (hypertension) 07/29/2015  . AKI (acute kidney injury) ([place]) 07/29/2015  . SIRS (systemic inflammatory response syndrome) ([place]) 07/29/2015  . Spinal stenosis of lumbar region 04/23/2013  . Tremor due to multiple drugs 04/23/2013  . Tobacco use disorder 04/23/2013  . COPD exacerbation ([place]) 04/23/2013  . Occlusion and stenosis of carotid artery without mention of cerebral infarction 03/12/2012  . Viral bronchitis-possible h. infl vs Norovirus 11/21/2011  . Pleuritic chest pain 09/18/2011    Past Surgical History:    Procedure Laterality Date  . CAROTID ENDARTERECTOMY Left Sept. 20,2011   cea  . CHOLECYSTECTOMY  1999  . GASTRECTOMY     age 38   Part of small intestin and part of stomach  . LEFT HEART CATHETERIZATION WITH CORONARY ANGIOGRAM N/A 10/02/2011   Procedure: LEFT HEART CATHETERIZATION WITH CORONARY ANGIOGRAM;  Surgeon: Sueanne Margarita, MD;  Location: [facility]ATH [facility];  Service: Cardiovascular;  Laterality: N/A;  . VIDEO BRONCHOSCOPY WITH ENDOBRONCHIAL NAVIGATION N/A 02/10/2020   Procedure: Video Bronchoscopy With Endobronchial Navigation;  Surgeon: Collene Gobble, MD;  Location: [facility] OR;  Service: Thoracic;  Laterality: N/A;  . VIDEO BRONCHOSCOPY WITH ENDOBRONCHIAL ULTRASOUND N/A 02/10/2020   Procedure: VIDEO BRONCHOSCOPY WITH ENDOBRONCHIAL ULTRASOUND;  Surgeon: Collene Gobble, MD;  Location: [facility] OR;  Service: Thoracic;  Laterality: N/A;     OB History   No obstetric history on file.     Family History  Problem Relation Age of Onset  . Diabetes Mother   . Hyperlipidemia Mother   . Heart attack Mother   . Other Mother        varicose veins,respiratory,stroke  . Heart disease Mother        before age 30  . Hypertension Mother   . Varicose Veins Mother   . Diabetes Father   . Heart disease Father        before age 68  . Hyperlipidemia Father   . Heart attack Father   . Other Father        varicose veins  . Hypertension Father   . Varicose Veins Father   . Diabetes Sister   . Heart disease Sister        before age 79  . Hyperlipidemia Sister   . Heart attack Sister   . Other Sister        varicose veins  . Hypertension Sister   . Varicose Veins Sister   . Peripheral vascular disease Sister   . Diabetes Sister   . Hyperlipidemia Sister   . Hypertension Sister   . Varicose Veins Sister     Social History   Tobacco Use  . Smoking status: Current Every Day Smoker    Packs/day: 2.00    Years: 50.00    Pack years: 100.00    Types: Cigarettes  . Smokeless tobacco: Never Used   . Tobacco comment: cessation info given and reviewed  Vaping Use  . Vaping Use: Never used  Substance Use Topics  . Alcohol use: No    Alcohol/week: 0.0 standard drinks  . Drug use: No    Home Medications Prior to Admission medications   Medication Sig Start Date End Date Taking? Authorizing Provider  albuterol (PROVENTIL) (2.5 MG/3ML) 0.083% nebulizer solution Inhale 3 mLs into the lungs 2 (two) times daily. 11/09/19   Regalado, Belkys A, MD  albuterol (VENTOLIN HFA) 108 (90 Base) MCG/ACT inhaler  Inhale 2 puffs into the lungs every 6 (six) hours as needed for wheezing or shortness of breath. 11/09/19   Regalado, Belkys A, MD  allopurinol (ZYLOPRIM) 100 MG tablet Take 1 tablet (100 mg total) by mouth daily. 02/12/20   Eugenie Filler, MD  amLODipine (NORVASC) 10 MG tablet Take 1 tablet (10 mg total) by mouth daily. 11/09/19   Regalado, Belkys A, MD  arformoterol (BROVANA) 15 MCG/2ML NEBU Take 2 mLs (15 mcg total) by nebulization 2 (two) times daily. 02/12/20   Eugenie Filler, MD  atorvastatin (LIPITOR) 20 MG tablet Take 1 tablet (20 mg total) by mouth at bedtime. 11/09/19   Regalado, Belkys A, MD  benzonatate (TESSALON) 100 MG capsule Take 1 capsule (100 mg total) by mouth 3 (three) times daily as needed for cough. 02/12/20   Eugenie Filler, MD  budesonide (PULMICORT) 0.5 MG/2ML nebulizer solution Take 2 mLs (0.5 mg total) by nebulization 2 (two) times daily. 02/12/20   Eugenie Filler, MD  Cyanocobalamin (VITAMIN B-12) 1000 MCG TABS Take 1 tablet (1,000 mcg total) by mouth daily. 02/13/20   Eugenie Filler, MD  DULoxetine (CYMBALTA) 20 MG capsule Take 20 mg by mouth daily. 12/21/19   [provider]  furosemide (LASIX) 40 MG tablet Take 1 tablet (40 mg total) by mouth daily. 11/24/19 02/24/20  Arrien, Jimmy Picket, MD  ipratropium-albuterol (DUONEB) 0.5-2.5 (3) MG/3ML SOLN Take 3 mLs by nebulization 2 (two) times daily. 02/12/20   Eugenie Filler, MD  metoprolol  succinate (TOPROL-XL) 25 MG 24 hr tablet Take 25 mg by mouth daily. 12/21/19   [provider]  Multiple Vitamin (MULTIVITAMIN) capsule Take 1 capsule by mouth daily.    [provider]  nicotine (NICODERM CQ - DOSED IN MG/24 HOURS) 14 mg/24hr patch Place 1 patch (14 mg total) onto the skin daily. 02/13/20   Eugenie Filler, MD  Nutritional Supplements (FEEDING SUPPLEMENT, KATE FARMS STANDARD 1.4,) LIQD liquid Take 325 mLs by mouth daily. 02/12/20   Eugenie Filler, MD  pantoprazole (PROTONIX) 40 MG tablet Take 40 mg by mouth 2 (two) times daily. 10/28/19   [provider]  umeclidinium bromide (INCRUSE ELLIPTA) 62.5 MCG/INH AEPB Inhale 1 puff into the lungs daily. 01/31/20   Caccavale, Sophia, PA-C    Allergies    Patient has no known allergies.  Review of Systems   Review of Systems  All other systems reviewed and are negative.   Physical Exam Updated Vital Signs BP (!) 142/67   Pulse 81   Temp 98.5 F (36.9 C) (Oral)   Resp 18   Ht 5\' 4"  (1.626 m)   Wt 48.1 kg   SpO2 100%   BMI 18.19 kg/m   Physical Exam Vitals and nursing note reviewed.  Constitutional:      General: She is in acute distress.     Appearance: Normal appearance. She is normal weight.     Comments: Crying holding her right eye  HENT:     Head: Normocephalic and atraumatic.     Right Ear: External ear normal.     Left Ear: External ear normal.  Eyes:     Comments: When I examine her right eye she has diffuse scleral injection, her cornea is hazy.  Cardiovascular:     Rate and Rhythm: Normal rate.     Pulses: Normal pulses.  Pulmonary:     Effort: Pulmonary effort is normal. No respiratory distress.  Musculoskeletal:     Cervical  back: Normal range of motion.  Skin:    General: Skin is warm and dry.  Neurological:     General: No focal deficit present.     Mental Status: She is alert and oriented to person, place, and time.     Cranial Nerves: No cranial nerve deficit.   Psychiatric:        Mood and Affect: Mood normal.        Behavior: Behavior normal.        Thought Content: Thought content normal.     ED Results / Procedures / Treatments   Labs (all labs ordered are listed, but only abnormal results are displayed) Labs Reviewed - No data to display  EKG None  Radiology No results found.  Procedures Procedures (including critical care time)  Medications Ordered in ED Medications  dorzolamide-timolol (COSOPT) 22.3-6.8 MG/ML ophthalmic solution 1 drop (1 drop Right Eye Given 09/04/20 0356)  tetracaine (PONTOCAINE) 0.5 % ophthalmic solution 2 drop (2 drops Both Eyes Given 09/04/20 0352)  fluorescein ophthalmic strip 1 strip (1 strip Both Eyes Given 09/04/20 0352)  acetaZOLAMIDE (DIAMOX) tablet 500 mg (500 mg Oral Given 09/04/20 0345)  pilocarpine (PILOCAR) 2 % ophthalmic solution 1 drop (1 drop Right Eye Given 09/04/20 0353)  oxyCODONE-acetaminophen (PERCOCET/ROXICET) 5-325 MG per tablet 1 tablet (1 tablet Oral Given 09/04/20 0345)  ondansetron (ZOFRAN-ODT) disintegrating tablet 4 mg (4 mg Oral Given 09/04/20 0346)  fentaNYL (SUBLIMAZE) injection 50 mcg (50 mcg Intramuscular Given 09/04/20 0500)    ED Course  I have reviewed the triage vital signs and the nursing notes.  Pertinent labs & imaging results that were available during my care of the patient were reviewed by me and considered in my medical decision making (see chart for details).    MDM Rules/Calculators/A&P                          I placed tetracaine in her right eye and fluorescein stain.  Under UV light there is no's uptake of stain.  With the Tono-Pen I got a pressure of 53.  Patient was started on medications for acute glaucoma.  I asked the patient if she is ever heard of that and she states it was familiar however she did not know if that is what she had had before.  Consult was made to ophthalmology.  She does not recall who she used to see.  Briefly going through her chart I do  not see any obvious ophthalmology notes.  3:21 AM Dr. Sharen Counter, ophthalmology.  He recommends starting patient on Cosopt 3 times a day and have her seen in the office on Monday, November 8.   4:45 AM I rechecked her pressure in her right eye which was still 52.  She states her pain is not improved.  We repeated the dose of pilocarpine.  Recheck at 6:00 AM patient states her pain is better after IM fentanyl.  When I review the [place] she was on oxycodone on a regular basis until she had an overdose in June.  However when I rechecked her pressure it still 51.  She has had the second dose of pilocarpine about an hour before I checked this pressure.  I am going to discuss with the ophthalmologist again.  Please note each time I checked her pressure I put in another drop of tetracaine.  6:23 AM patient rediscussed with Dr. Sharen Counter, ophthalmology.  He states he feels patient can be discharged.  He states the only ophthalmologist he do eye injections are [facility] and she can either be seen by them or his office but he feels they should see her tomorrow.  Final Clinical Impression(s) / ED Diagnoses Final diagnoses:  Glaucoma of right eye, unspecified glaucoma type    Rx / DC Orders ED Discharge Orders    None     Plan discharge  Rolland Porter, MD, Barbette Or, MD 09/04/20 [phone]

## 2020-09-04 NOTE — ED Triage Notes (Signed)
Pt came in via [facility] from [facility] due to R eye pain, redness and photophobia. Pt states that it feels stabbing, and her head and neck are hurting as well. Pt has been on Tobramycin for 6 days. Pt has had pain for abut a week. Pt also had Ultram at 0150

## 2020-09-04 NOTE — ED Notes (Signed)
Non emergent transport called to transfer patient to [facility]. Patient is discharged

## 2020-09-04 NOTE — ED Notes (Signed)
Patient out of bed yelling for pain medication. RN explained that she was given Percocet (PO) less than 30 minutes ago.

## 2020-09-08 DIAGNOSIS — N183 Chronic kidney disease, stage 3 unspecified: Secondary | ICD-10-CM | POA: Diagnosis not present

## 2020-09-08 DIAGNOSIS — G20 Parkinson's disease: Secondary | ICD-10-CM | POA: Diagnosis not present

## 2020-09-08 DIAGNOSIS — E46 Unspecified protein-calorie malnutrition: Secondary | ICD-10-CM | POA: Diagnosis not present

## 2020-09-08 DIAGNOSIS — I739 Peripheral vascular disease, unspecified: Secondary | ICD-10-CM | POA: Diagnosis not present

## 2020-09-08 DIAGNOSIS — I5030 Unspecified diastolic (congestive) heart failure: Secondary | ICD-10-CM | POA: Diagnosis not present

## 2020-09-08 DIAGNOSIS — J9611 Chronic respiratory failure with hypoxia: Secondary | ICD-10-CM | POA: Diagnosis not present

## 2020-09-08 DIAGNOSIS — G8929 Other chronic pain: Secondary | ICD-10-CM | POA: Diagnosis not present

## 2020-09-08 DIAGNOSIS — J449 Chronic obstructive pulmonary disease, unspecified: Secondary | ICD-10-CM | POA: Diagnosis not present

## 2020-09-08 DIAGNOSIS — E1122 Type 2 diabetes mellitus with diabetic chronic kidney disease: Secondary | ICD-10-CM | POA: Diagnosis not present

## 2020-09-12 DIAGNOSIS — Z20822 Contact with and (suspected) exposure to covid-19: Secondary | ICD-10-CM | POA: Diagnosis not present

## 2020-09-23 DIAGNOSIS — H2101 Hyphema, right eye: Secondary | ICD-10-CM | POA: Diagnosis not present

## 2020-09-23 DIAGNOSIS — H4051X3 Glaucoma secondary to other eye disorders, right eye, severe stage: Secondary | ICD-10-CM | POA: Diagnosis not present

## 2020-09-23 DIAGNOSIS — I6529 Occlusion and stenosis of unspecified carotid artery: Secondary | ICD-10-CM | POA: Diagnosis not present

## 2020-09-23 DIAGNOSIS — F17200 Nicotine dependence, unspecified, uncomplicated: Secondary | ICD-10-CM | POA: Diagnosis not present

## 2020-09-23 DIAGNOSIS — H5711 Ocular pain, right eye: Secondary | ICD-10-CM | POA: Diagnosis not present

## 2020-10-06 DIAGNOSIS — I739 Peripheral vascular disease, unspecified: Secondary | ICD-10-CM | POA: Diagnosis not present

## 2020-10-06 DIAGNOSIS — I5030 Unspecified diastolic (congestive) heart failure: Secondary | ICD-10-CM | POA: Diagnosis not present

## 2020-10-06 DIAGNOSIS — G20 Parkinson's disease: Secondary | ICD-10-CM | POA: Diagnosis not present

## 2020-10-06 DIAGNOSIS — J9611 Chronic respiratory failure with hypoxia: Secondary | ICD-10-CM | POA: Diagnosis not present

## 2020-10-06 DIAGNOSIS — N183 Chronic kidney disease, stage 3 unspecified: Secondary | ICD-10-CM | POA: Diagnosis not present

## 2020-10-06 DIAGNOSIS — J449 Chronic obstructive pulmonary disease, unspecified: Secondary | ICD-10-CM | POA: Diagnosis not present

## 2020-10-06 DIAGNOSIS — G8929 Other chronic pain: Secondary | ICD-10-CM | POA: Diagnosis not present

## 2020-10-06 DIAGNOSIS — E46 Unspecified protein-calorie malnutrition: Secondary | ICD-10-CM | POA: Diagnosis not present

## 2020-10-06 DIAGNOSIS — E1122 Type 2 diabetes mellitus with diabetic chronic kidney disease: Secondary | ICD-10-CM | POA: Diagnosis not present

## 2020-10-17 DIAGNOSIS — Z20822 Contact with and (suspected) exposure to covid-19: Secondary | ICD-10-CM | POA: Diagnosis not present

## 2020-10-24 DIAGNOSIS — Z20822 Contact with and (suspected) exposure to covid-19: Secondary | ICD-10-CM | POA: Diagnosis not present

## 2020-10-24 DIAGNOSIS — J449 Chronic obstructive pulmonary disease, unspecified: Secondary | ICD-10-CM | POA: Diagnosis not present

## 2020-10-24 DIAGNOSIS — R0602 Shortness of breath: Secondary | ICD-10-CM | POA: Diagnosis not present

## 2020-10-24 DIAGNOSIS — R059 Cough, unspecified: Secondary | ICD-10-CM | POA: Diagnosis not present

## 2020-11-11 DIAGNOSIS — H2101 Hyphema, right eye: Secondary | ICD-10-CM | POA: Diagnosis not present

## 2020-11-11 DIAGNOSIS — H4051X3 Glaucoma secondary to other eye disorders, right eye, severe stage: Secondary | ICD-10-CM | POA: Diagnosis not present

## 2020-11-17 ENCOUNTER — Other Ambulatory Visit (HOSPITAL_COMMUNITY): Payer: Self-pay | Admitting: Internal Medicine

## 2020-11-17 DIAGNOSIS — G8929 Other chronic pain: Secondary | ICD-10-CM | POA: Diagnosis not present

## 2020-11-17 DIAGNOSIS — I5030 Unspecified diastolic (congestive) heart failure: Secondary | ICD-10-CM | POA: Diagnosis not present

## 2020-11-17 DIAGNOSIS — I739 Peripheral vascular disease, unspecified: Secondary | ICD-10-CM | POA: Diagnosis not present

## 2020-11-17 DIAGNOSIS — N183 Chronic kidney disease, stage 3 unspecified: Secondary | ICD-10-CM | POA: Diagnosis not present

## 2020-11-17 DIAGNOSIS — J449 Chronic obstructive pulmonary disease, unspecified: Secondary | ICD-10-CM | POA: Diagnosis not present

## 2020-11-17 DIAGNOSIS — E1122 Type 2 diabetes mellitus with diabetic chronic kidney disease: Secondary | ICD-10-CM | POA: Diagnosis not present

## 2020-11-17 DIAGNOSIS — E46 Unspecified protein-calorie malnutrition: Secondary | ICD-10-CM | POA: Diagnosis not present

## 2020-11-17 DIAGNOSIS — I6529 Occlusion and stenosis of unspecified carotid artery: Secondary | ICD-10-CM

## 2020-11-17 DIAGNOSIS — J9611 Chronic respiratory failure with hypoxia: Secondary | ICD-10-CM | POA: Diagnosis not present

## 2020-11-17 DIAGNOSIS — G20 Parkinson's disease: Secondary | ICD-10-CM | POA: Diagnosis not present

## 2020-11-22 DIAGNOSIS — Z20822 Contact with and (suspected) exposure to covid-19: Secondary | ICD-10-CM | POA: Diagnosis not present

## 2020-11-23 DIAGNOSIS — I10 Essential (primary) hypertension: Secondary | ICD-10-CM | POA: Diagnosis not present

## 2020-11-23 DIAGNOSIS — E1149 Type 2 diabetes mellitus with other diabetic neurological complication: Secondary | ICD-10-CM | POA: Diagnosis not present

## 2020-11-28 ENCOUNTER — Ambulatory Visit (HOSPITAL_COMMUNITY): Payer: Medicare HMO

## 2020-12-15 DIAGNOSIS — J449 Chronic obstructive pulmonary disease, unspecified: Secondary | ICD-10-CM | POA: Diagnosis not present

## 2020-12-15 DIAGNOSIS — J9611 Chronic respiratory failure with hypoxia: Secondary | ICD-10-CM | POA: Diagnosis not present

## 2020-12-15 DIAGNOSIS — E1122 Type 2 diabetes mellitus with diabetic chronic kidney disease: Secondary | ICD-10-CM | POA: Diagnosis not present

## 2020-12-15 DIAGNOSIS — I5030 Unspecified diastolic (congestive) heart failure: Secondary | ICD-10-CM | POA: Diagnosis not present

## 2020-12-15 DIAGNOSIS — N183 Chronic kidney disease, stage 3 unspecified: Secondary | ICD-10-CM | POA: Diagnosis not present

## 2020-12-15 DIAGNOSIS — G8929 Other chronic pain: Secondary | ICD-10-CM | POA: Diagnosis not present

## 2020-12-15 DIAGNOSIS — G20 Parkinson's disease: Secondary | ICD-10-CM | POA: Diagnosis not present

## 2020-12-15 DIAGNOSIS — I739 Peripheral vascular disease, unspecified: Secondary | ICD-10-CM | POA: Diagnosis not present

## 2020-12-15 DIAGNOSIS — E46 Unspecified protein-calorie malnutrition: Secondary | ICD-10-CM | POA: Diagnosis not present

## 2021-01-05 DIAGNOSIS — E46 Unspecified protein-calorie malnutrition: Secondary | ICD-10-CM | POA: Diagnosis not present

## 2021-01-05 DIAGNOSIS — I739 Peripheral vascular disease, unspecified: Secondary | ICD-10-CM | POA: Diagnosis not present

## 2021-01-05 DIAGNOSIS — I5030 Unspecified diastolic (congestive) heart failure: Secondary | ICD-10-CM | POA: Diagnosis not present

## 2021-01-05 DIAGNOSIS — E1122 Type 2 diabetes mellitus with diabetic chronic kidney disease: Secondary | ICD-10-CM | POA: Diagnosis not present

## 2021-01-05 DIAGNOSIS — J449 Chronic obstructive pulmonary disease, unspecified: Secondary | ICD-10-CM | POA: Diagnosis not present

## 2021-01-05 DIAGNOSIS — G8929 Other chronic pain: Secondary | ICD-10-CM | POA: Diagnosis not present

## 2021-01-05 DIAGNOSIS — G20 Parkinson's disease: Secondary | ICD-10-CM | POA: Diagnosis not present

## 2021-01-05 DIAGNOSIS — J9611 Chronic respiratory failure with hypoxia: Secondary | ICD-10-CM | POA: Diagnosis not present

## 2021-01-05 DIAGNOSIS — N183 Chronic kidney disease, stage 3 unspecified: Secondary | ICD-10-CM | POA: Diagnosis not present

## 2021-01-06 DIAGNOSIS — E1149 Type 2 diabetes mellitus with other diabetic neurological complication: Secondary | ICD-10-CM | POA: Diagnosis not present

## 2021-01-06 DIAGNOSIS — I10 Essential (primary) hypertension: Secondary | ICD-10-CM | POA: Diagnosis not present

## 2021-02-01 DIAGNOSIS — I10 Essential (primary) hypertension: Secondary | ICD-10-CM | POA: Diagnosis not present

## 2021-02-01 DIAGNOSIS — E1149 Type 2 diabetes mellitus with other diabetic neurological complication: Secondary | ICD-10-CM | POA: Diagnosis not present

## 2021-02-09 DIAGNOSIS — I739 Peripheral vascular disease, unspecified: Secondary | ICD-10-CM | POA: Diagnosis not present

## 2021-02-09 DIAGNOSIS — G8929 Other chronic pain: Secondary | ICD-10-CM | POA: Diagnosis not present

## 2021-02-09 DIAGNOSIS — N183 Chronic kidney disease, stage 3 unspecified: Secondary | ICD-10-CM | POA: Diagnosis not present

## 2021-02-09 DIAGNOSIS — G20 Parkinson's disease: Secondary | ICD-10-CM | POA: Diagnosis not present

## 2021-02-09 DIAGNOSIS — J9611 Chronic respiratory failure with hypoxia: Secondary | ICD-10-CM | POA: Diagnosis not present

## 2021-02-09 DIAGNOSIS — E46 Unspecified protein-calorie malnutrition: Secondary | ICD-10-CM | POA: Diagnosis not present

## 2021-02-09 DIAGNOSIS — I5030 Unspecified diastolic (congestive) heart failure: Secondary | ICD-10-CM | POA: Diagnosis not present

## 2021-02-09 DIAGNOSIS — J449 Chronic obstructive pulmonary disease, unspecified: Secondary | ICD-10-CM | POA: Diagnosis not present

## 2021-02-09 DIAGNOSIS — E1122 Type 2 diabetes mellitus with diabetic chronic kidney disease: Secondary | ICD-10-CM | POA: Diagnosis not present

## 2021-03-16 DIAGNOSIS — I739 Peripheral vascular disease, unspecified: Secondary | ICD-10-CM | POA: Diagnosis not present

## 2021-03-16 DIAGNOSIS — G8929 Other chronic pain: Secondary | ICD-10-CM | POA: Diagnosis not present

## 2021-03-16 DIAGNOSIS — N183 Chronic kidney disease, stage 3 unspecified: Secondary | ICD-10-CM | POA: Diagnosis not present

## 2021-03-16 DIAGNOSIS — I5030 Unspecified diastolic (congestive) heart failure: Secondary | ICD-10-CM | POA: Diagnosis not present

## 2021-03-16 DIAGNOSIS — E46 Unspecified protein-calorie malnutrition: Secondary | ICD-10-CM | POA: Diagnosis not present

## 2021-03-16 DIAGNOSIS — G20 Parkinson's disease: Secondary | ICD-10-CM | POA: Diagnosis not present

## 2021-03-16 DIAGNOSIS — E1122 Type 2 diabetes mellitus with diabetic chronic kidney disease: Secondary | ICD-10-CM | POA: Diagnosis not present

## 2021-03-16 DIAGNOSIS — J9611 Chronic respiratory failure with hypoxia: Secondary | ICD-10-CM | POA: Diagnosis not present

## 2021-03-16 DIAGNOSIS — J449 Chronic obstructive pulmonary disease, unspecified: Secondary | ICD-10-CM | POA: Diagnosis not present

## 2021-03-23 DIAGNOSIS — R109 Unspecified abdominal pain: Secondary | ICD-10-CM | POA: Diagnosis not present

## 2021-03-23 DIAGNOSIS — R112 Nausea with vomiting, unspecified: Secondary | ICD-10-CM | POA: Diagnosis not present

## 2021-03-23 DIAGNOSIS — R110 Nausea: Secondary | ICD-10-CM | POA: Diagnosis not present

## 2021-03-24 DIAGNOSIS — E1149 Type 2 diabetes mellitus with other diabetic neurological complication: Secondary | ICD-10-CM | POA: Diagnosis not present

## 2021-03-24 DIAGNOSIS — I10 Essential (primary) hypertension: Secondary | ICD-10-CM | POA: Diagnosis not present

## 2021-03-29 DIAGNOSIS — I10 Essential (primary) hypertension: Secondary | ICD-10-CM | POA: Diagnosis not present

## 2021-03-29 DIAGNOSIS — E1149 Type 2 diabetes mellitus with other diabetic neurological complication: Secondary | ICD-10-CM | POA: Diagnosis not present

## 2021-04-27 DIAGNOSIS — R109 Unspecified abdominal pain: Secondary | ICD-10-CM | POA: Diagnosis not present

## 2021-04-27 DIAGNOSIS — R112 Nausea with vomiting, unspecified: Secondary | ICD-10-CM | POA: Diagnosis not present

## 2021-04-28 DIAGNOSIS — N390 Urinary tract infection, site not specified: Secondary | ICD-10-CM | POA: Diagnosis not present

## 2021-05-11 DIAGNOSIS — N183 Chronic kidney disease, stage 3 unspecified: Secondary | ICD-10-CM | POA: Diagnosis not present

## 2021-05-11 DIAGNOSIS — G20 Parkinson's disease: Secondary | ICD-10-CM | POA: Diagnosis not present

## 2021-05-11 DIAGNOSIS — E1122 Type 2 diabetes mellitus with diabetic chronic kidney disease: Secondary | ICD-10-CM | POA: Diagnosis not present

## 2021-05-11 DIAGNOSIS — J449 Chronic obstructive pulmonary disease, unspecified: Secondary | ICD-10-CM | POA: Diagnosis not present

## 2021-05-22 DIAGNOSIS — E1149 Type 2 diabetes mellitus with other diabetic neurological complication: Secondary | ICD-10-CM | POA: Diagnosis not present

## 2021-05-22 DIAGNOSIS — I10 Essential (primary) hypertension: Secondary | ICD-10-CM | POA: Diagnosis not present

## 2021-06-08 DIAGNOSIS — G20 Parkinson's disease: Secondary | ICD-10-CM | POA: Diagnosis not present

## 2021-06-08 DIAGNOSIS — E1122 Type 2 diabetes mellitus with diabetic chronic kidney disease: Secondary | ICD-10-CM | POA: Diagnosis not present

## 2021-06-08 DIAGNOSIS — J449 Chronic obstructive pulmonary disease, unspecified: Secondary | ICD-10-CM | POA: Diagnosis not present

## 2021-06-08 DIAGNOSIS — N183 Chronic kidney disease, stage 3 unspecified: Secondary | ICD-10-CM | POA: Diagnosis not present

## 2021-06-23 DIAGNOSIS — G4700 Insomnia, unspecified: Secondary | ICD-10-CM | POA: Diagnosis not present

## 2021-06-23 DIAGNOSIS — F419 Anxiety disorder, unspecified: Secondary | ICD-10-CM | POA: Diagnosis not present

## 2021-06-23 DIAGNOSIS — F329 Major depressive disorder, single episode, unspecified: Secondary | ICD-10-CM | POA: Diagnosis not present

## 2021-07-06 DIAGNOSIS — J449 Chronic obstructive pulmonary disease, unspecified: Secondary | ICD-10-CM | POA: Diagnosis not present

## 2021-07-06 DIAGNOSIS — E1122 Type 2 diabetes mellitus with diabetic chronic kidney disease: Secondary | ICD-10-CM | POA: Diagnosis not present

## 2021-07-06 DIAGNOSIS — G20 Parkinson's disease: Secondary | ICD-10-CM | POA: Diagnosis not present

## 2021-07-06 DIAGNOSIS — N183 Chronic kidney disease, stage 3 unspecified: Secondary | ICD-10-CM | POA: Diagnosis not present

## 2021-07-09 DIAGNOSIS — R2241 Localized swelling, mass and lump, right lower limb: Secondary | ICD-10-CM | POA: Diagnosis not present

## 2021-07-09 DIAGNOSIS — L539 Erythematous condition, unspecified: Secondary | ICD-10-CM | POA: Diagnosis not present

## 2021-07-10 DIAGNOSIS — F419 Anxiety disorder, unspecified: Secondary | ICD-10-CM | POA: Diagnosis not present

## 2021-07-10 DIAGNOSIS — F329 Major depressive disorder, single episode, unspecified: Secondary | ICD-10-CM | POA: Diagnosis not present

## 2021-07-10 DIAGNOSIS — G4700 Insomnia, unspecified: Secondary | ICD-10-CM | POA: Diagnosis not present

## 2021-07-28 DIAGNOSIS — F419 Anxiety disorder, unspecified: Secondary | ICD-10-CM | POA: Diagnosis not present

## 2021-07-28 DIAGNOSIS — G4700 Insomnia, unspecified: Secondary | ICD-10-CM | POA: Diagnosis not present

## 2021-07-28 DIAGNOSIS — F329 Major depressive disorder, single episode, unspecified: Secondary | ICD-10-CM | POA: Diagnosis not present

## 2021-08-02 ENCOUNTER — Other Ambulatory Visit (HOSPITAL_COMMUNITY): Payer: Self-pay | Admitting: Internal Medicine

## 2021-08-02 DIAGNOSIS — H409 Unspecified glaucoma: Secondary | ICD-10-CM

## 2021-08-10 ENCOUNTER — Ambulatory Visit (HOSPITAL_COMMUNITY): Admission: RE | Admit: 2021-08-10 | Payer: Medicare Other | Source: Ambulatory Visit

## 2021-08-10 ENCOUNTER — Ambulatory Visit (HOSPITAL_COMMUNITY): Payer: Medicare Other

## 2021-08-16 ENCOUNTER — Other Ambulatory Visit: Payer: Self-pay

## 2021-08-16 ENCOUNTER — Ambulatory Visit (INDEPENDENT_AMBULATORY_CARE_PROVIDER_SITE_OTHER): Payer: Medicare Other | Admitting: Podiatry

## 2021-08-16 ENCOUNTER — Encounter: Payer: Self-pay | Admitting: Podiatry

## 2021-08-16 DIAGNOSIS — N1832 Chronic kidney disease, stage 3b: Secondary | ICD-10-CM | POA: Diagnosis not present

## 2021-08-16 DIAGNOSIS — B351 Tinea unguium: Secondary | ICD-10-CM | POA: Diagnosis not present

## 2021-08-16 DIAGNOSIS — G629 Polyneuropathy, unspecified: Secondary | ICD-10-CM | POA: Diagnosis not present

## 2021-08-16 DIAGNOSIS — M79674 Pain in right toe(s): Secondary | ICD-10-CM | POA: Diagnosis not present

## 2021-08-16 DIAGNOSIS — M79675 Pain in left toe(s): Secondary | ICD-10-CM | POA: Diagnosis not present

## 2021-08-16 NOTE — Addendum Note (Signed)
Addended by: Cranford Mon R on: 08/16/2021 02:24 PM   Modules accepted: Orders

## 2021-08-16 NOTE — Progress Notes (Signed)
  Subjective:  Patient ID: [name], female    DOB: [birth year],   MRN: [id number]  Chief Complaint  Patient presents with   Nail Problem    I have some thick and discolored toenails and I am not sure if I am a diabetic and I do not take anything for it     75 y.o. female presents for thickened elongated toenails that are difficult to trim. Relates burning and tingling in both of her feet.  Denies being diabetic. Last A1c was 1 year ago and was 5.6. Does have a history of CKD and neuropathy.   . Denies any other pedal complaints. Denies n/v/f/c.   Past Medical History:  Diagnosis Date   Anemia    Anxiety and depression    Arthritis    Asthma    Carotid artery occlusion    Chronic pain    leg and feet   CKD (chronic kidney disease), stage IV (HCC)    COPD (chronic obstructive pulmonary disease) (HCC)    Coronary artery disease    DDD (degenerative disc disease)    Diarrhea    chronic    DVT (deep venous thrombosis) (HCC)    Dyspnea    Fibromyalgia    GERD (gastroesophageal reflux disease)    Grave's disease    Headache    HOH (hard of hearing)    Hypertension    Lung nodule    Mitral regurgitation    OP (osteoporosis)    Parkinson's disease ([place])    Peripheral vascular disease (HCC)    Protein calorie malnutrition (HCC)    PUD (peptic ulcer disease)    Recurrent upper respiratory infection (URI)    Rhinitis    Sleep apnea    Spinal stenosis of lumbar region 04/23/2013   Thrombophlebitis    Tobacco use disorder 04/23/2013   Vitamin B 12 deficiency     Objective:  Physical Exam: Vascular: DPpulses 2/4 bilateral and 1/4 PT pulses. CFT <3 seconds. Absent hair growth on digits. Non-pitting edema noted to bilateral lower extremities.  Skin. No lacerations or abrasions bilateral feet. Nails 1-5 are thickened discolored and elongated with subungual debris.  Musculoskeletal: MMT 5/5 bilateral lower extremities in DF, PF, Inversion and Eversion. Deceased ROM in DF of  ankle joint.  Neurological: Sensation intact to light touch. Protective sensation diminished.   Assessment:   1. Stage 3b chronic kidney disease ([place])   2. Neuropathy   3. Onychomycosis   4. Pain due to onychomycosis of toenails of both feet      Plan:  Patient was evaluated and treated and all questions answered. -Discussed and educated patient on foot care, especially with  regards to the vascular, neurological and musculoskeletal systems.  -Discussed supportive shoes at all times and checking feet regularly.  -Mechanically debrided all nails 1-5 bilateral using sterile nail nipper and filed with dremel without incident  -Answered all patient questions -Patient to return  in 3 months for at risk foot care -Patient advised to call the office if any problems or questions arise in the meantime.   Lorenda Peck, DPM

## 2021-11-11 IMAGING — NM NM PULMONARY PERF PARTICULATE
8 series · 8 of 8 positions shown · non-contrast
Comparison: Chest x-ray 11/06/2019

CLINICAL DATA: Shortness of breath

EXAM:
NUCLEAR MEDICINE PERFUSION LUNG SCAN
TECHNIQUE: Perfusion images were obtained in multiple projections after
intravenous injection of radiopharmaceutical.
Ventilation scans intentionally deferred if perfusion scan and chest
x-ray adequate for interpretation during COVID 19 epidemic.
RADIOPHARMACEUTICALS:  4.06 mCi 6c-88m MAA IV

[Series 1: ant/post perf · 4.14mm/px · 1 of 1 slices shown (1 of 2)]
[im 1/1]
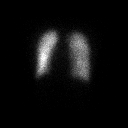

[Series 1: ant/post perf · 4.14mm/px · 1 of 1 slices shown (2 of 2)]
[im 1/1]
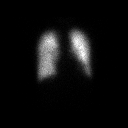

[Series 2: lao/rpo perf · 4.14mm/px · 1 of 1 slices shown (1 of 2)]
[im 1/1]
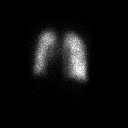

[Series 2: lao/rpo perf · 4.14mm/px · 1 of 1 slices shown (2 of 2)]
[im 1/1]
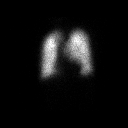

[Series 3: lpo/rao perf · 4.14mm/px · 1 of 1 slices shown (1 of 2)]
[im 1/1]
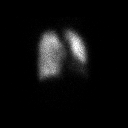

[Series 3: lpo/rao perf · 4.14mm/px · 1 of 1 slices shown (2 of 2)]
[im 1/1]
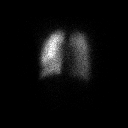

[Series 4: lt lat/rt lat perf · 4.14mm/px · 1 of 1 slices shown (1 of 2)]
[im 1/1]
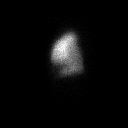

[Series 4: lt lat/rt lat perf · 4.14mm/px · 1 of 1 slices shown (2 of 2)]
[im 1/1]
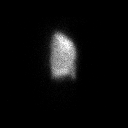

[8 of 8 positions shown; findings below may reference images not displayed]

FINDINGS: Homogeneous perfusion to the lungs without focal perfusion defect.
IMPRESSION: Negative.  No perfusion abnormality is seen.

## 2021-11-13 ENCOUNTER — Encounter (HOSPITAL_COMMUNITY): Payer: Self-pay

## 2021-11-13 ENCOUNTER — Encounter: Payer: Self-pay | Admitting: *Deleted

## 2021-11-13 ENCOUNTER — Emergency Department (HOSPITAL_COMMUNITY): Payer: Medicare Other

## 2021-11-13 ENCOUNTER — Other Ambulatory Visit: Payer: Self-pay

## 2021-11-13 ENCOUNTER — Emergency Department (HOSPITAL_COMMUNITY)
Admission: EM | Admit: 2021-11-13 | Discharge: 2021-11-13 | Disposition: A | Discharge status: Other Acute Inpatient Hospital | Payer: Medicare Other | Attending: Emergency Medicine | Admitting: Emergency Medicine

## 2021-11-13 DIAGNOSIS — J449 Chronic obstructive pulmonary disease, unspecified: Secondary | ICD-10-CM | POA: Diagnosis not present

## 2021-11-13 DIAGNOSIS — N183 Chronic kidney disease, stage 3 unspecified: Secondary | ICD-10-CM | POA: Insufficient documentation

## 2021-11-13 DIAGNOSIS — R222 Localized swelling, mass and lump, trunk: Secondary | ICD-10-CM | POA: Insufficient documentation

## 2021-11-13 DIAGNOSIS — G20 Parkinson's disease: Secondary | ICD-10-CM | POA: Diagnosis not present

## 2021-11-13 DIAGNOSIS — R0789 Other chest pain: Secondary | ICD-10-CM | POA: Insufficient documentation

## 2021-11-13 DIAGNOSIS — Z79899 Other long term (current) drug therapy: Secondary | ICD-10-CM | POA: Diagnosis not present

## 2021-11-13 DIAGNOSIS — I129 Hypertensive chronic kidney disease with stage 1 through stage 4 chronic kidney disease, or unspecified chronic kidney disease: Secondary | ICD-10-CM | POA: Diagnosis not present

## 2021-11-13 LAB — BASIC METABOLIC PANEL
Anion gap: 8 (ref 5–15)
BUN: 45 mg/dL — ABNORMAL HIGH (ref 8–23)
CO2: 19 mmol/L — ABNORMAL LOW (ref 22–32)
Calcium: 8.5 mg/dL — ABNORMAL LOW (ref 8.9–10.3)
Chloride: 112 mmol/L — ABNORMAL HIGH (ref 98–111)
Creatinine, Ser: 1.95 mg/dL — ABNORMAL HIGH (ref 0.44–1.00)
GFR, Estimated: 26 mL/min — ABNORMAL LOW (ref >60)
Glucose, Bld: 98 mg/dL (ref 70–99)
Potassium: 4.3 mmol/L (ref 3.5–5.1)
Sodium: 139 mmol/L (ref 135–145)

## 2021-11-13 LAB — CBC
HCT: 33.4 % — ABNORMAL LOW (ref 36.0–46.0)
Hemoglobin: 10.3 g/dL — ABNORMAL LOW (ref 12.0–15.0)
MCH: 28.8 pg (ref 26.0–34.0)
MCHC: 30.8 g/dL (ref 30.0–36.0)
MCV: 93.3 fL (ref 80.0–100.0)
Platelets: 230 10*3/uL (ref 150–400)
RBC: 3.58 MIL/uL — ABNORMAL LOW (ref 3.87–5.11)
RDW: 16 % — ABNORMAL HIGH (ref 11.5–15.5)
WBC: 7.5 10*3/uL (ref 4.0–10.5)
nRBC: 0 % (ref 0.0–0.2)

## 2021-11-13 LAB — TROPONIN I (HIGH SENSITIVITY)
Troponin I (High Sensitivity): 3 ng/L (ref <18)
Troponin I (High Sensitivity): 3 ng/L (ref <18)

## 2021-11-13 MED ORDER — ASPIRIN 81 MG PO CHEW
324.0000 mg | CHEWABLE_TABLET | Freq: Once | ORAL | Status: AC
  Administered 2021-11-13: 324 mg via ORAL
  Filled 2021-11-13: qty 4

## 2021-11-13 MED ORDER — NITROGLYCERIN 0.4 MG SL SUBL
0.4000 mg | SUBLINGUAL_TABLET | SUBLINGUAL | Status: DC | PRN
  Administered 2021-11-13: 0.4 mg via SUBLINGUAL
  Filled 2021-11-13: qty 1

## 2021-11-13 NOTE — ED Notes (Signed)
Report to [facility] attempted x2.

## 2021-11-13 NOTE — Progress Notes (Signed)
Oncology Nurse Navigator Documentation  Oncology Nurse Navigator Flowsheets 11/13/2021  Abnormal Finding Date 11/13/2021  Diagnosis Status Additional Work Up  Navigator Follow Up Reason: Appointment Review  Navigation Complete Date: 11/14/2021  Navigator Location [facility]  Referral Date to RadOnc/MedOnc 11/13/2021  Navigator Encounter Type Telephone/I received referral on Ms. Mihalko today. I called her to schedule an appt with med onc. I was unable to reach or leave vm message.   Telephone Outgoing Call  Treatment Phase Abnormal Scans  Barriers/Navigation Needs Coordination of Care;Education  Acuity Level 2-Minimal Needs (1-2 Barriers Identified)  Time Spent with Patient 30

## 2021-11-13 NOTE — ED Notes (Signed)
Report given to Pascal, LPN at [facility].

## 2021-11-13 NOTE — Discharge Instructions (Addendum)
You had a CT scan performed today in the emergency department that showed a mass on your chest that is concerning for cancer.  This requires further work-up by specialists.  Please follow-up closely with your facility doctor to help arrange further work-up for your chest mass.  Please also discuss with your doctor regarding adjusting your pain medications to better control your pain.

## 2021-11-13 NOTE — ED Provider Notes (Signed)
[facility] DEPT Provider Note   CSN: [id number] Arrival date & time: 11/13/21  0231     History  Chief Complaint  Patient presents with   Chest Pain    [name] is a 76 y.o. female.  The history is provided by the patient, the EMS personnel and medical records.  Chest Pain [name] is a 76 y.o. female who presents to the Emergency Department complaining of chest pain.  She presents to the ED by EMS from [facility] for evaluation of intermittent chest pain for the last few weeks.  Pain is located in the left upper chest/back and left upper extremity.  Pain is described as a hurting pain.  Pain worsened last night and she was treated with two nitroglycerin at her facility with improvement in -pain from 9/10 to 5/10.    No change with movement, breathing.  No fever, nausea, leg swelling, abdominal pain.    Has a hx/o stage III CKD, parkinsons, COPD on prednisone, Htn    Home Medications Prior to Admission medications   Medication Sig Start Date End Date Taking? Authorizing Provider  Acetaminophen (TYLENOL PO) Take by mouth.    [provider]  albuterol (PROVENTIL) (2.5 MG/3ML) 0.083% nebulizer solution Inhale 3 mLs into the lungs 2 (two) times daily. 11/09/19   Regalado, Belkys A, MD  allopurinol (ZYLOPRIM) 100 MG tablet Take 1 tablet (100 mg total) by mouth daily. 02/12/20   Eugenie Filler, MD  amLODipine (NORVASC) 10 MG tablet Take 1 tablet (10 mg total) by mouth daily. 11/09/19   Regalado, Belkys A, MD  arformoterol (BROVANA) 15 MCG/2ML NEBU Take 2 mLs (15 mcg total) by nebulization 2 (two) times daily. 02/12/20   Eugenie Filler, MD  atorvastatin (LIPITOR) 20 MG tablet Take 1 tablet (20 mg total) by mouth at bedtime. 11/09/19   Regalado, Belkys A, MD  atorvastatin (LIPITOR) 20 MG tablet Take by mouth.    [provider]  brimonidine (ALPHAGAN) 0.2 % ophthalmic solution Place 1 drop into the right eye in the  morning and at bedtime. 09/23/20   [provider]  budesonide (PULMICORT) 0.5 MG/2ML nebulizer solution Take 2 mLs (0.5 mg total) by nebulization 2 (two) times daily. 02/12/20   Eugenie Filler, MD  budesonide (PULMICORT) 0.5 MG/2ML nebulizer solution Take 0.5 mg by nebulization 2 (two) times daily.    [provider]  CHOLECALCIFEROL PO Take by mouth.    [provider]  cholestyramine light (PREVALITE) 4 g packet Take 4 g by mouth 2 (two) times daily.    [provider]  Cyanocobalamin (VITAMIN B-12) 1000 MCG TABS Take 1 tablet (1,000 mcg total) by mouth daily. 02/13/20   Eugenie Filler, MD  dorzolamide-timolol (COSOPT) 22.3-6.8 MG/ML ophthalmic solution Place 1 drop into the right eye 2 times daily. 09/23/20   [provider]  DULoxetine (CYMBALTA) 20 MG capsule Take 20 mg by mouth daily. 12/21/19   [provider]  furosemide (LASIX) 40 MG tablet Take 1 tablet (40 mg total) by mouth daily. 11/24/19 02/24/20  Arrien, Jimmy Picket, MD  gabapentin (NEURONTIN) 100 MG capsule  05/21/21   [provider]  HM LIDOCAINE PATCH EX Apply topically.    [provider]  ipratropium-albuterol (DUONEB) 0.5-2.5 (3) MG/3ML SOLN Take 3 mLs by nebulization 2 (two) times daily. 02/12/20   Eugenie Filler, MD  latanoprost (XALATAN) 0.005 % ophthalmic solution Place 1 drop into both eyes daily. 09/23/20  [provider]  metoprolol succinate (TOPROL-XL) 25 MG 24 hr tablet Take 25 mg by mouth daily. 12/21/19   [provider]  MODERNA COVID-19 VACCINE 100 MCG/0.5ML injection  04/27/21   [provider]  Multiple Vitamin (MULTIVITAMIN) capsule Take 1 capsule by mouth daily.    [provider]  Nutritional Supplements (RESOURCE 2.0 PO) Take by mouth.    [provider]  pantoprazole (PROTONIX) 20 MG tablet Take 20 mg by mouth daily.    [provider]  pantoprazole (PROTONIX) 40 MG tablet  Take 40 mg by mouth daily.    [provider]  pilocarpine (PILOCAR) 2 % ophthalmic solution  06/15/21   [provider]  predniSONE (DELTASONE) 5 MG tablet  05/21/21   [provider]  tiotropium (SPIRIVA) 18 MCG inhalation capsule Place 18 mcg into inhaler and inhale daily.    [provider]  traMADol (ULTRAM) 50 MG tablet Take by mouth.    [provider]  TRAZODONE HCL PO Take by mouth.    [provider]      Allergies    Patient has no known allergies.    Review of Systems   Review of Systems  Cardiovascular:  Positive for chest pain.  All other systems reviewed and are negative.  Physical Exam Updated Vital Signs BP 137/71    Pulse 66    Temp 97.6 F (36.4 C) (Oral)    Resp 16    Ht 5\' 4"  (1.626 m)    Wt 48 kg    SpO2 95%    BMI 18.16 kg/m  Physical Exam Vitals and nursing note reviewed.  Constitutional:      Appearance: She is well-developed.  HENT:     Head: Normocephalic and atraumatic.  Cardiovascular:     Rate and Rhythm: Normal rate and regular rhythm.     Heart sounds: No murmur heard. Pulmonary:     Effort: Pulmonary effort is normal. No respiratory distress.     Breath sounds: Normal breath sounds.  Chest:     Chest wall: No tenderness.  Abdominal:     Palpations: Abdomen is soft.     Tenderness: There is no abdominal tenderness. There is no guarding or rebound.  Musculoskeletal:        General: No swelling or tenderness.     Comments: 2+ radial pulses bilaterally  Skin:    General: Skin is warm and dry.  Neurological:     Mental Status: She is alert and oriented to person, place, and time.  Psychiatric:        Behavior: Behavior normal.    ED Results / Procedures / Treatments   Labs (all labs ordered are listed, but only abnormal results are displayed) Labs Reviewed  BASIC METABOLIC PANEL - Abnormal; Notable for the following components:      Result Value   Chloride 112 (*)    CO2 19 (*)     BUN 45 (*)    Creatinine, Ser 1.95 (*)    Calcium 8.5 (*)    GFR, Estimated 26 (*)    All other components within normal limits  CBC - Abnormal; Notable for the following components:   RBC 3.58 (*)    Hemoglobin 10.3 (*)    HCT 33.4 (*)    RDW 16.0 (*)    All other components within normal limits  TROPONIN I (HIGH SENSITIVITY)  TROPONIN I (HIGH SENSITIVITY)    EKG EKG Interpretation  Date/Time:  Monday  November 13 2021 02:56:34 EST Ventricular Rate:  66 PR Interval:  178 QRS Duration: 95 QT Interval:  419 QTC Calculation: 439 R Axis:   -51 Text Interpretation: Sinus rhythm Left anterior fascicular block Confirmed by Quintella Reichert [phone]) on 11/13/2021 2:59:01 AM  Radiology DG Chest 2 View  Result Date: 11/13/2021 CLINICAL DATA:  Chest pain EXAM: CHEST - 2 VIEW COMPARISON:  02/11/2020 FINDINGS: Low volume chest with streaky density at the bases. Background of hyperinflation. Normal heart size. Right paratracheal fullness concerning for adenopathy in this setting left para median mass measuring 3.5 cm, enlarged from radiography in 2021. New right upper lobe nodule measuring 9 mm. Streaky density in the bilateral lower lungs. IMPRESSION: 1. Left upper lobe mass, at site of suspected tumor which was evaluated/biopsied in 2021. The mass has enlarged since prior and there is new mediastinal widening and right upper lobe nodule concerning for malignant spread, recommend updated chest CT. 2. Atelectasis at the lung bases. Electronically Signed   By: Jorje Guild M.D.   On: 11/13/2021 04:30   CT Chest Wo Contrast  Result Date: 11/13/2021 CLINICAL DATA:  Evaluate lung mass. EXAM: CT CHEST WITHOUT CONTRAST TECHNIQUE: Multidetector CT imaging of the chest was performed following the standard protocol without IV contrast. RADIATION DOSE REDUCTION: This exam was performed according to the departmental dose-optimization program which includes automated exposure control, adjustment of the mA  and/or kV according to patient size and/or use of iterative reconstruction technique. COMPARISON:  CT chest 02/02/2020 FINDINGS: Cardiovascular: Heart size is normal. Aortic atherosclerosis. No pericardial effusion. Coronary artery calcifications. Mediastinum/Nodes: Normal appearance of the thyroid gland. The trachea appears patent and is midline. Normal appearance of the esophagus. Mediastinal adenopathy is identified, including: -right paratracheal lymph node measuring 2.5 cm, image 59/2. Previously this measured 1.4 cm. -High right paratracheal lymph node measures 2.1 cm, image 49/2. Previously 0.9 cm. -Hilar lymph nodes are suboptimally evaluated due to lack of IV contrast material. Lungs/Pleura: No pleural effusion identified. Bilateral lower lung zone predominant interstitial reticulation and parenchymal banding identified which is most likely postinflammatory/infectious in etiology. Paravertebral mass within the medial left apex measures 4.7 x 3.0 cm, image 28/5. Focal erosive changes along the third rib noted at the costovertebral junction. There also focal erosive changes involving the posteromedial aspect of the left fourth rib, image 26/2. Previously this lesion measured 1.7 x 1.1 cm. Subpleural mass within the medial apex of the right upper lobe is also identified measuring 4.0 x 2.1 cm, image 44/5. This is new compared with 02/02/2020. Satellite nodule within the adjacent right perihilar region measures 7 mm, image 54/5. Within the periphery of the right upper lobe there is a 7 mm nodule, image 60/5. Suspicious for metastatic disease. Upper Abdomen: No acute abnormality within the imaged portions of the upper abdomen. Inferior right hepatic lobe lesion is identified measuring 2.6 cm, image 174/2. This does not meet CT criteria for a simple cyst and this is a new finding compared with CT AP from 01/11/2015. Status post cholecystectomy with increase caliber of the common bile duct. Stable from previous  exam. Progressive atrophy of the right kidney. Aortic atherosclerotic calcifications noted. Musculoskeletal: No acute findings identified. Signs of chest wall involvement of left apical lung mass noted as described above. IMPRESSION: 1. There is a progressive left apical lung mass with associated erosive changes involving the posteromedial aspect of the left fourth and third ribs. Findings are compatible with malignancy. Recommend nonemergent referral to multi disciplinary thoracic oncology for  further management. 2. There is a second subpleural mass within the medial apex of the right upper lobe which is new compared with 02/02/2020 and is worrisome for malignancy. 3. There is a 7 mm nodule within the periphery of the right upper lobe which is nonspecific but suspicious for metastatic disease. 4. There is a new, enlarged right paratracheal lymph node worrisome for metastasis. 5. There is a low-attenuation lesion within the inferior right hepatic lobe which does not meet CT criteria for a simple cyst. This is a new finding compared with AP from 02/02/2020. Cannot exclude metastatic disease. 6. Progressive atrophy of the right kidney. 7. Coronary artery calcifications. 8. Aortic Atherosclerosis (ICD10-I70.0). Electronically Signed   By: Kerby Moors M.D.   On: 11/13/2021 07:42    Procedures Procedures    Medications Ordered in ED Medications  aspirin chewable tablet 324 mg (324 mg Oral Given 11/13/21 0350)    ED Course/ Medical Decision Making/ A&P                           Medical Decision Making  Patient with history of COPD, stage III CKD here for evaluation of several weeks of chest pain.  She did have improvement in her pain with nitroglycerin but does not have consistent exertional symptoms.  No additional respiratory symptoms.  Patient initially with improved pain on ED presentation, resolved on later assessment.  EKG is without acute ischemic changes.  Chest x-ray does have a suspicious mass  present, personally reviewed.  Discussed with patient chest x-ray results and recommendation for further imaging.  Given her renal insufficiency a noncontrast CT chest was obtained, which demonstrates chest mass concerning for malignancy.  Troponins are negative x2, BMP with stable renal function.  Suspect that this mass is contributing to some of her discomfort.  Do not feel that she has current ACS, dissection, PE, pneumonia.  Discussed with patient as well as her sister over the phone findings of imaging.  Discussed importance of close outpatient follow-up for further work-up and evaluation of her mass.  Return precautions discussed.        Final Clinical Impression(s) / ED Diagnoses Final diagnoses:  Other chest pain  Mass in chest    Rx / DC Orders ED Discharge Orders          Ordered    Ambulatory referral to Hematology / Oncology        11/13/21 0751              Quintella Reichert, MD 11/13/21 2231

## 2021-11-13 NOTE — ED Notes (Signed)
Attempted report to [facility].

## 2021-11-13 NOTE — ED Triage Notes (Signed)
Patient BIB [facility] from [facility] with chest pain that has been off and on for the past couple weeks. On the left upper back and radiates to her left arm. Patient has fibromyalgia. Nursing home gave 2 nitro, that gave some relief. Refused ambulance aspirin that was offered.

## 2021-11-13 NOTE — ED Notes (Signed)
Patient transported to CT 

## 2021-11-13 NOTE — ED Notes (Signed)
[facility] called for transport.  

## 2021-11-15 ENCOUNTER — Telehealth: Payer: Self-pay | Admitting: Internal Medicine

## 2021-11-15 NOTE — Telephone Encounter (Signed)
Scheduled appt per 1/17 referral. Spoke to scheduling coordinator at [facility] who is aware of appt date and time. She is aware pt needs to arrive 15 mins prior to her appt time.

## 2021-11-16 ENCOUNTER — Other Ambulatory Visit: Payer: Self-pay

## 2021-11-16 ENCOUNTER — Inpatient Hospital Stay: Payer: Medicare Other | Attending: Internal Medicine | Admitting: Internal Medicine

## 2021-11-16 ENCOUNTER — Inpatient Hospital Stay: Payer: Medicare Other

## 2021-11-16 VITALS — BP 137/73 | HR 77 | Temp 97.7°F | Resp 19 | Ht 64.0 in | Wt 106.0 lb

## 2021-11-16 DIAGNOSIS — R918 Other nonspecific abnormal finding of lung field: Secondary | ICD-10-CM

## 2021-11-16 DIAGNOSIS — N189 Chronic kidney disease, unspecified: Secondary | ICD-10-CM | POA: Diagnosis not present

## 2021-11-16 DIAGNOSIS — F17210 Nicotine dependence, cigarettes, uncomplicated: Secondary | ICD-10-CM | POA: Diagnosis not present

## 2021-11-16 DIAGNOSIS — C3490 Malignant neoplasm of unspecified part of unspecified bronchus or lung: Secondary | ICD-10-CM

## 2021-11-16 DIAGNOSIS — I129 Hypertensive chronic kidney disease with stage 1 through stage 4 chronic kidney disease, or unspecified chronic kidney disease: Secondary | ICD-10-CM | POA: Diagnosis not present

## 2021-11-16 NOTE — Progress Notes (Signed)
[name] [phone]   [phone]  CONSULT NOTE  REFERRING PHYSICIAN: Dr. Quintella Reichert  REASON FOR CONSULTATION:  76 years old white female with highly suspicious for lung cancer.  HPI [name] is a 76 y.o. female with long history of smoking and past medical history significant for COPD, coronary artery disease, chronic kidney disease, carotid artery occlusion, asthma, anxiety and depression, fibromyalgia, hypertension, mitral regurgitation, Parkinson's disease, peripheral vascular disease, peptic ulcer disease, history of DVT and vitamin B12 deficiency.  The patient presented to the emergency department on November 13, 2021 complaining of chest pain that has been going on for few weeks located in the left upper chest and back.  The patient had chest x-ray initially on November 13, 2021 and it showed left upper lobe mass at site of suspected tumor that was evaluated and biopsied in 2021 but has enlarged since that time with new mediastinal widening and right upper lobe nodule concerning for malignant spread.  She had CT scan of the chest without contrast performed on the same day and the scan showed paravertebral mass within the medial left apex measuring 4.7 x 3.0 cm with focal erosive changes along the third rib noted at the costovertebral junction.  There is also focal erosive changes involving the posterior medial aspect of the left fourth rib.  The scan also showed subpleural mass within the medial apex of the right upper lobe measuring 4.0 x 2.1 cm and this is new compared to every 03/2020.  There was a satellite nodule within the adjacent right perihilar region measuring 0.7 cm and within the periphery of the right upper lobe there was also a 0.7 cm nodule suspicious for metastatic disease.  There was mediastinal adenopathy identified including right paratracheal lymph node measuring 2.5 cm, high right paratracheal lymph node measuring 2.1 cm.  The  scan also showed low-attenuation lesion within the inferior right hepatic lobe concerning for metastatic disease.  The patient was referred to me today for evaluation and recommendation regarding treatment of her condition. When seen today she continues to have pain on the left side of the chest as well as cough with 1 episode of blood-tinged sputum.  She has shortness of breath at baseline increased with exertion.  She denied having any weight loss or night sweats.  She has no nausea, vomiting but has frequent diarrhea with questionable colitis.  She has no headache but has right eye blindness. Family history significant for mother with diabetes mellitus and dyslipidemia.  Father had heart disease.  Niece had cancer and daughter died from liver cancer. The patient is a widow and has 2 children.  She used to work as a Theme park manager.  She is currently at a skilled nursing facility, [facility].  She has a history for smoking up to 3 packs/day for 65 years and not willing to quit.  She drinks alcohol occasionally no history of drug abuse.   HPI  Past Medical History:  Diagnosis Date   Anemia    Anxiety and depression    Arthritis    Asthma    Carotid artery occlusion    Chronic pain    leg and feet   CKD (chronic kidney disease), stage IV (HCC)    COPD (chronic obstructive pulmonary disease) (HCC)    Coronary artery disease    DDD (degenerative disc disease)    Diarrhea    chronic    DVT (deep venous thrombosis) (HCC)    Dyspnea  Fibromyalgia    GERD (gastroesophageal reflux disease)    Grave's disease    Headache    HOH (hard of hearing)    Hypertension    Lung nodule    Mitral regurgitation    OP (osteoporosis)    Parkinson's disease (HCC)    Peripheral vascular disease (HCC)    Protein calorie malnutrition (HCC)    PUD (peptic ulcer disease)    Recurrent upper respiratory infection (URI)    Rhinitis    Sleep apnea    Spinal stenosis of lumbar region 04/23/2013    Thrombophlebitis    Tobacco use disorder 04/23/2013   Vitamin B 12 deficiency     Past Surgical History:  Procedure Laterality Date   CAROTID ENDARTERECTOMY Left Sept. 20,2011   cea   CHOLECYSTECTOMY  1999   GASTRECTOMY     age 51   Part of small intestin and part of stomach   LEFT HEART CATHETERIZATION WITH CORONARY ANGIOGRAM N/A 10/02/2011   Procedure: LEFT HEART CATHETERIZATION WITH CORONARY ANGIOGRAM;  Surgeon: Sueanne Margarita, MD;  Location: [facility];  Service: Cardiovascular;  Laterality: N/A;   VIDEO BRONCHOSCOPY WITH ENDOBRONCHIAL NAVIGATION N/A 02/10/2020   Procedure: Video Bronchoscopy With Endobronchial Navigation;  Surgeon: Collene Gobble, MD;  Location: [facility];  Service: Thoracic;  Laterality: N/A;   VIDEO BRONCHOSCOPY WITH ENDOBRONCHIAL ULTRASOUND N/A 02/10/2020   Procedure: VIDEO BRONCHOSCOPY WITH ENDOBRONCHIAL ULTRASOUND;  Surgeon: Collene Gobble, MD;  Location: [facility];  Service: Thoracic;  Laterality: N/A;    Family History  Problem Relation Age of Onset   Diabetes Mother    Hyperlipidemia Mother    Heart attack Mother    Other Mother        varicose veins,respiratory,stroke   Heart disease Mother        before age 60   Hypertension Mother    Varicose Veins Mother    Diabetes Father    Heart disease Father        before age 79   Hyperlipidemia Father    Heart attack Father    Other Father        varicose veins   Hypertension Father    Varicose Veins Father    Diabetes Sister    Heart disease Sister        before age 63   Hyperlipidemia Sister    Heart attack Sister    Other Sister        varicose veins   Hypertension Sister    Varicose Veins Sister    Peripheral vascular disease Sister    Diabetes Sister    Hyperlipidemia Sister    Hypertension Sister    Varicose Veins Sister     Social History Social History   Tobacco Use   Smoking status: Every Day    Packs/day: 2.00    Years: 50.00    Pack years: 100.00    Types: Cigarettes    Smokeless tobacco: Never   Tobacco comments:    cessation info given and reviewed  Vaping Use   Vaping Use: Never used  Substance Use Topics   Alcohol use: No    Alcohol/week: 0.0 standard drinks   Drug use: No    No Known Allergies  Current Outpatient Medications  Medication Sig Dispense Refill   Acetaminophen (TYLENOL PO) Take by mouth.     albuterol (PROVENTIL) (2.5 MG/3ML) 0.083% nebulizer solution Inhale 3 mLs into the lungs 2 (two) times daily. 75 mL 12   allopurinol (  ZYLOPRIM) 100 MG tablet Take 1 tablet (100 mg total) by mouth daily.     amLODipine (NORVASC) 10 MG tablet Take 1 tablet (10 mg total) by mouth daily. 30 tablet    arformoterol (BROVANA) 15 MCG/2ML NEBU Take 2 mLs (15 mcg total) by nebulization 2 (two) times daily. 120 mL 1   atorvastatin (LIPITOR) 20 MG tablet Take 1 tablet (20 mg total) by mouth at bedtime. 30 tablet 2   atorvastatin (LIPITOR) 20 MG tablet Take by mouth.     brimonidine (ALPHAGAN) 0.2 % ophthalmic solution Place 1 drop into the right eye in the morning and at bedtime.     budesonide (PULMICORT) 0.5 MG/2ML nebulizer solution Take 2 mLs (0.5 mg total) by nebulization 2 (two) times daily. 120 mL 1   budesonide (PULMICORT) 0.5 MG/2ML nebulizer solution Take 0.5 mg by nebulization 2 (two) times daily.     CHOLECALCIFEROL PO Take by mouth.     cholestyramine light (PREVALITE) 4 g packet Take 4 g by mouth 2 (two) times daily.     Cyanocobalamin (VITAMIN B-12) 1000 MCG TABS Take 1 tablet (1,000 mcg total) by mouth daily.     dorzolamide-timolol (COSOPT) 22.3-6.8 MG/ML ophthalmic solution Place 1 drop into the right eye 2 times daily.     DULoxetine (CYMBALTA) 20 MG capsule Take 20 mg by mouth daily.     furosemide (LASIX) 40 MG tablet Take 1 tablet (40 mg total) by mouth daily. 30 tablet 0   gabapentin (NEURONTIN) 100 MG capsule      HM LIDOCAINE PATCH EX Apply topically.     ipratropium-albuterol (DUONEB) 0.5-2.5 (3) MG/3ML SOLN Take 3 mLs by  nebulization 2 (two) times daily. 360 mL 0   latanoprost (XALATAN) 0.005 % ophthalmic solution Place 1 drop into both eyes daily.     metoprolol succinate (TOPROL-XL) 25 MG 24 hr tablet Take 25 mg by mouth daily.     MODERNA COVID-19 VACCINE 100 MCG/0.5ML injection      Multiple Vitamin (MULTIVITAMIN) capsule Take 1 capsule by mouth daily.     Nutritional Supplements (RESOURCE 2.0 PO) Take by mouth.     pantoprazole (PROTONIX) 20 MG tablet Take 20 mg by mouth daily.     pantoprazole (PROTONIX) 40 MG tablet Take 40 mg by mouth daily.     pilocarpine (PILOCAR) 2 % ophthalmic solution      predniSONE (DELTASONE) 5 MG tablet      tiotropium (SPIRIVA) 18 MCG inhalation capsule Place 18 mcg into inhaler and inhale daily.     traMADol (ULTRAM) 50 MG tablet Take by mouth.     TRAZODONE HCL PO Take by mouth.     No current facility-administered medications for this visit.    Review of Systems  Constitutional: positive for fatigue Eyes: negative Ears, nose, mouth, throat, and face: negative Respiratory: positive for cough, dyspnea on exertion, and pleurisy/chest pain Cardiovascular: negative Gastrointestinal: positive for diarrhea Genitourinary:negative Integument/breast: negative Hematologic/lymphatic: negative Musculoskeletal:negative Neurological: negative Behavioral/Psych: negative Endocrine: negative Allergic/Immunologic: negative  Physical Exam  LOV:FIEPP, healthy, no distress, well nourished, and well developed SKIN: skin color, texture, turgor are normal, no rashes or significant lesions HEAD: Normocephalic, No masses, lesions, tenderness or abnormalities EYES: normal, PERRLA, Conjunctiva are pink and non-injected EARS: External ears normal, Canals clear OROPHARYNX:no exudate, no erythema, and lips, buccal mucosa, and tongue normal  NECK: supple, no adenopathy, no JVD LYMPH:  no palpable lymphadenopathy, no hepatosplenomegaly BREAST:not examined LUNGS: clear to auscultation  , and palpation HEART:  regular rate & rhythm, no murmurs, and no gallops ABDOMEN:abdomen soft, non-tender, normal bowel sounds, and no masses or organomegaly BACK: Back symmetric, no curvature., No CVA tenderness EXTREMITIES:no joint deformities, effusion, or inflammation, no edema  NEURO: alert & oriented x 3 with fluent speech, no focal motor/sensory deficits  PERFORMANCE STATUS: ECOG 1  LABORATORY DATA: Lab Results  Component Value Date   WBC 7.5 11/13/2021   HGB 10.3 (L) 11/13/2021   HCT 33.4 (L) 11/13/2021   MCV 93.3 11/13/2021   PLT 230 11/13/2021      Chemistry      Component Value Date/Time   NA 139 11/13/2021 0243   K 4.3 11/13/2021 0243   CL 112 (H) 11/13/2021 0243   CO2 19 (L) 11/13/2021 0243   BUN 45 (H) 11/13/2021 0243   CREATININE 1.95 (H) 11/13/2021 0243      Component Value Date/Time   CALCIUM 8.5 (L) 11/13/2021 0243   ALKPHOS 131 (H) 02/03/2020 0517   AST 28 02/03/2020 0517   ALT 25 02/03/2020 0517   BILITOT 0.5 02/03/2020 0517       RADIOGRAPHIC STUDIES: DG Chest 2 View  Result Date: 11/13/2021 CLINICAL DATA:  Chest pain EXAM: CHEST - 2 VIEW COMPARISON:  02/11/2020 FINDINGS: Low volume chest with streaky density at the bases. Background of hyperinflation. Normal heart size. Right paratracheal fullness concerning for adenopathy in this setting left para median mass measuring 3.5 cm, enlarged from radiography in 2021. New right upper lobe nodule measuring 9 mm. Streaky density in the bilateral lower lungs. IMPRESSION: 1. Left upper lobe mass, at site of suspected tumor which was evaluated/biopsied in 2021. The mass has enlarged since prior and there is new mediastinal widening and right upper lobe nodule concerning for malignant spread, recommend updated chest CT. 2. Atelectasis at the lung bases. Electronically Signed   By: Jorje Guild M.D.   On: 11/13/2021 04:30   CT Chest Wo Contrast  Result Date: 11/13/2021 CLINICAL DATA:  Evaluate lung mass.  EXAM: CT CHEST WITHOUT CONTRAST TECHNIQUE: Multidetector CT imaging of the chest was performed following the standard protocol without IV contrast. RADIATION DOSE REDUCTION: This exam was performed according to the departmental dose-optimization program which includes automated exposure control, adjustment of the mA and/or kV according to patient size and/or use of iterative reconstruction technique. COMPARISON:  CT chest 02/02/2020 FINDINGS: Cardiovascular: Heart size is normal. Aortic atherosclerosis. No pericardial effusion. Coronary artery calcifications. Mediastinum/Nodes: Normal appearance of the thyroid gland. The trachea appears patent and is midline. Normal appearance of the esophagus. Mediastinal adenopathy is identified, including: -right paratracheal lymph node measuring 2.5 cm, image 59/2. Previously this measured 1.4 cm. -High right paratracheal lymph node measures 2.1 cm, image 49/2. Previously 0.9 cm. -Hilar lymph nodes are suboptimally evaluated due to lack of IV contrast material. Lungs/Pleura: No pleural effusion identified. Bilateral lower lung zone predominant interstitial reticulation and parenchymal banding identified which is most likely postinflammatory/infectious in etiology. Paravertebral mass within the medial left apex measures 4.7 x 3.0 cm, image 28/5. Focal erosive changes along the third rib noted at the costovertebral junction. There also focal erosive changes involving the posteromedial aspect of the left fourth rib, image 26/2. Previously this lesion measured 1.7 x 1.1 cm. Subpleural mass within the medial apex of the right upper lobe is also identified measuring 4.0 x 2.1 cm, image 44/5. This is new compared with 02/02/2020. Satellite nodule within the adjacent right perihilar region measures 7 mm, image 54/5. Within the periphery of the  right upper lobe there is a 7 mm nodule, image 60/5. Suspicious for metastatic disease. Upper Abdomen: No acute abnormality within the imaged  portions of the upper abdomen. Inferior right hepatic lobe lesion is identified measuring 2.6 cm, image 174/2. This does not meet CT criteria for a simple cyst and this is a new finding compared with CT AP from 01/11/2015. Status post cholecystectomy with increase caliber of the common bile duct. Stable from previous exam. Progressive atrophy of the right kidney. Aortic atherosclerotic calcifications noted. Musculoskeletal: No acute findings identified. Signs of chest wall involvement of left apical lung mass noted as described above. IMPRESSION: 1. There is a progressive left apical lung mass with associated erosive changes involving the posteromedial aspect of the left fourth and third ribs. Findings are compatible with malignancy. Recommend nonemergent referral to multi disciplinary thoracic oncology for further management. 2. There is a second subpleural mass within the medial apex of the right upper lobe which is new compared with 02/02/2020 and is worrisome for malignancy. 3. There is a 7 mm nodule within the periphery of the right upper lobe which is nonspecific but suspicious for metastatic disease. 4. There is a new, enlarged right paratracheal lymph node worrisome for metastasis. 5. There is a low-attenuation lesion within the inferior right hepatic lobe which does not meet CT criteria for a simple cyst. This is a new finding compared with AP from 02/02/2020. Cannot exclude metastatic disease. 6. Progressive atrophy of the right kidney. 7. Coronary artery calcifications. 8. Aortic Atherosclerosis (ICD10-I70.0). Electronically Signed   By: Kerby Moors M.D.   On: 11/13/2021 07:42    ASSESSMENT: This is a very pleasant 76 years old white female with likely stage IV (T2b, N2, M1 B) lung cancer pending tissue diagnosis and presented with left apical lung mass in addition to right upper lobe mass as well as multiple pulmonary nodules and mediastinal lymphadenopathy as well as suspicious hepatic lesion.   There was also some evidence for osseous invasion from the malignancy in the left upper lobe.   PLAN: I had a lengthy discussion with the patient today about her current condition and further investigation to confirm her diagnosis and possible treatment options. I recommended for the patient to see Dr. Lamonte Sakai or Dr. Valeta Harms with pulmonary medicine for consideration of bronchoscopy with endobronchial ultrasound and biopsy for confirmation of her tissue diagnosis. I will complete the staging work-up by ordering a PET scan as well as MRI of the brain to rule out any other metastatic disease. The patient is currently symptomatic from the osseous invasion of the left apical mass, I will send her to radiation oncology for consideration of palliative radiotherapy to this area. I will see the patient back for follow-up visit in around 2 weeks for more detailed discussion of her treatment options based on the final pathology and staging work-up. For the smoking cessation, I strongly encouraged the patient to quit smoking but she is not interested in quitting smoking. The patient was advised to call immediately if she has any other concerning symptoms in the interval. The patient voices understanding of current disease status and treatment options and is in agreement with the current care plan. All questions were answered. The patient knows to call the clinic with any problems, questions or concerns. We can certainly see the patient much sooner if necessary.  Thank you so much for allowing me to participate in the care of [name]. I will continue to follow up the patient with  you and assist in her care.  The total time spent in the appointment was 60 minutes.  Disclaimer: This note was dictated with voice recognition software. Similar sounding words can inadvertently be transcribed and may not be corrected upon review.   Eilleen Kempf November 16, 2021, 1:44 PM

## 2021-11-17 DIAGNOSIS — R918 Other nonspecific abnormal finding of lung field: Secondary | ICD-10-CM | POA: Insufficient documentation

## 2021-11-20 ENCOUNTER — Encounter: Payer: Self-pay | Admitting: *Deleted

## 2021-11-20 ENCOUNTER — Encounter (HOSPITAL_COMMUNITY): Payer: Self-pay | Admitting: Emergency Medicine

## 2021-11-20 ENCOUNTER — Ambulatory Visit: Payer: Medicare Other | Admitting: Podiatry

## 2021-11-20 ENCOUNTER — Emergency Department (HOSPITAL_COMMUNITY)
Admission: EM | Admit: 2021-11-20 | Discharge: 2021-11-20 | Disposition: A | Discharge status: Skilled Nursing Facility | Payer: Medicare Other | Attending: Emergency Medicine | Admitting: Emergency Medicine

## 2021-11-20 DIAGNOSIS — T40601A Poisoning by unspecified narcotics, accidental (unintentional), initial encounter: Secondary | ICD-10-CM | POA: Insufficient documentation

## 2021-11-20 NOTE — ED Provider Notes (Signed)
[facility] DEPT Provider Note   CSN: [id number] Arrival date & time: 11/20/21  3710     History  No chief complaint on file.   [name] is a 76 y.o. female.  76 yo F with a chief complaints of an unintentional opiate overdose.  The patient is in a skilled nursing facility and she recently went on hospice care.  She has been getting narcotics as well as benzodiazepines.  Reportedly her respiratory rate had decreased and she was given Narcan and sent here.  The patient has no complaint currently and would like to go back home.  Further history was obtained by EMS who did not have any other specific concern from the skilled nursing facility.  The history is provided by the patient and the EMS personnel.  Illness Severity:  Moderate Onset quality:  Gradual Duration:  2 hours Timing:  Rare Progression:  Partially resolved Chronicity:  New Associated symptoms: no chest pain, no congestion, no fever, no headaches, no myalgias, no nausea, no rhinorrhea, no shortness of breath, no vomiting and no wheezing       Home Medications Prior to Admission medications   Medication Sig Start Date End Date Taking? Authorizing Provider  Acetaminophen (TYLENOL PO) Take by mouth.    [provider]  allopurinol (ZYLOPRIM) 100 MG tablet Take 1 tablet (100 mg total) by mouth daily. 02/12/20   Eugenie Filler, MD  amLODipine (NORVASC) 10 MG tablet Take 1 tablet (10 mg total) by mouth daily. 11/09/19   Regalado, Belkys A, MD  atorvastatin (LIPITOR) 20 MG tablet Take 1 tablet (20 mg total) by mouth at bedtime. 11/09/19   Regalado, Belkys A, MD  brimonidine (ALPHAGAN) 0.2 % ophthalmic solution Place 1 drop into the right eye in the morning and at bedtime. 09/23/20   [provider]  budesonide (PULMICORT) 0.5 MG/2ML nebulizer solution Take 2 mLs (0.5 mg total) by nebulization 2 (two) times daily. 02/12/20   Eugenie Filler, MD  CHOLECALCIFEROL PO  Take by mouth.    [provider]  cholestyramine light (PREVALITE) 4 g packet Take 4 g by mouth 2 (two) times daily.    [provider]  Cyanocobalamin (VITAMIN B-12) 1000 MCG TABS Take 1 tablet (1,000 mcg total) by mouth daily. 02/13/20   Eugenie Filler, MD  diclofenac Sodium (VOLTAREN) 1 % GEL Apply topically 4 (four) times daily.    [provider]  dorzolamide-timolol (COSOPT) 22.3-6.8 MG/ML ophthalmic solution Place 1 drop into the right eye 2 times daily. 09/23/20   [provider]  DULoxetine (CYMBALTA) 20 MG capsule Take 20 mg by mouth daily. 12/21/19   [provider]  ferrous sulfate 325 (65 FE) MG tablet Take 325 mg by mouth daily with breakfast.    [provider]  furosemide (LASIX) 40 MG tablet Take 1 tablet (40 mg total) by mouth daily. 11/24/19 11/16/21  Arrien, Jimmy Picket, MD  gabapentin (NEURONTIN) 300 MG capsule Take 300 mg by mouth 3 (three) times daily.    [provider]  HM LIDOCAINE PATCH EX Apply topically.    [provider]  HYDROcodone-acetaminophen (NORCO/VICODIN) 5-325 MG tablet Take 1 tablet by mouth every 6 (six) hours as needed for moderate pain.    [provider]  ipratropium-albuterol (DUONEB) 0.5-2.5 (3) MG/3ML SOLN Take 3 mLs by nebulization 2 (two) times daily. 02/12/20   Eugenie Filler, MD  latanoprost (XALATAN) 0.005 % ophthalmic solution Place 1 drop into both  eyes daily. 09/23/20   [provider]  metoprolol succinate (TOPROL-XL) 25 MG 24 hr tablet Take 25 mg by mouth daily. 12/21/19   [provider]  MODERNA COVID-19 VACCINE 100 MCG/0.5ML injection  04/27/21   [provider]  Multiple Vitamin (MULTIVITAMIN) capsule Take 1 capsule by mouth daily.    [provider]  nitroGLYCERIN (NITROSTAT) 0.4 MG SL tablet Place 0.4 mg under the tongue every 5 (five) minutes as needed for chest pain. Patient not taking: Reported on 11/16/2021     [provider]  Nutritional Supplements (RESOURCE 2.0 PO) Take by mouth.    [provider]  pantoprazole (PROTONIX) 20 MG tablet Take 20 mg by mouth daily.    [provider]  pilocarpine (PILOCAR) 2 % ophthalmic solution  06/15/21   [provider]  predniSONE (DELTASONE) 5 MG tablet  05/21/21   [provider]  tiotropium (SPIRIVA) 18 MCG inhalation capsule Place 18 mcg into inhaler and inhale daily.    [provider]  traMADol (ULTRAM) 50 MG tablet Take by mouth.    [provider]  TRAZODONE HCL PO Take by mouth.    [provider]      Allergies    Patient has no known allergies.    Review of Systems   Review of Systems  Constitutional:  Negative for chills and fever.  HENT:  Negative for congestion and rhinorrhea.   Eyes:  Negative for redness and visual disturbance.  Respiratory:  Negative for shortness of breath and wheezing.   Cardiovascular:  Negative for chest pain and palpitations.  Gastrointestinal:  Negative for nausea and vomiting.  Genitourinary:  Negative for dysuria and urgency.  Musculoskeletal:  Negative for arthralgias and myalgias.  Skin:  Negative for pallor and wound.  Neurological:  Negative for dizziness and headaches.   Physical Exam Updated Vital Signs BP (!) 108/50    Pulse 85    Temp 100.2 F (37.9 C) (Oral)    Resp 18    SpO2 96%  Physical Exam Vitals and nursing note reviewed.  Constitutional:      General: She is not in acute distress.    Appearance: She is well-developed. She is ill-appearing. She is not diaphoretic.     Comments: Chronically ill appearing   HENT:     Head: Normocephalic and atraumatic.  Cardiovascular:     Rate and Rhythm: Normal rate and regular rhythm.     Heart sounds: No murmur heard.   No friction rub. No gallop.  Pulmonary:     Effort: Pulmonary effort is normal.     Breath sounds: No wheezing or rales.  Abdominal:     General: There is no  distension.     Palpations: Abdomen is soft.     Tenderness: There is no abdominal tenderness.  Musculoskeletal:        General: No tenderness.     Cervical back: Normal range of motion and neck supple.  Skin:    General: Skin is warm and dry.  Neurological:     Mental Status: She is alert and oriented to person, place, and time.  Psychiatric:        Behavior: Behavior normal.    ED Results / Procedures / Treatments   Labs (all labs ordered are listed, but only abnormal results are displayed) Labs Reviewed - No data to display  EKG None  Radiology No results found.  Procedures Procedures    Medications Ordered in ED Medications -  No data to display  ED Course/ Medical Decision Making/ A&P                           Medical Decision Making  76 yo F with a chief complaints of an unintentional opiate overdose.  The patient is currently under hospice care and is getting narcotics for comfort.  Per EMS report had worsening respiratory status this morning and was given Narcan and was sent here for evaluation.  The patient does not endorse any complaints on my initial exam.  She is on 4 L of oxygen which per EMS is her baseline.  I discussed with her about obtaining an evaluation here in the ED including blood work or imaging which she is currently declining.  She would prefer to go back home.  She elects to not have any further testing.  Patient's records were reviewed.  Had recently seen the oncologist and was given a referral to try and see pulm for biopsy.  9:51 AM:  I have discussed the diagnosis/risks/treatment options with the patient and believe the pt to be eligible for discharge home to follow-up with PCP, oncology. We also discussed returning to the ED immediately if new or worsening sx occur. We discussed the sx which are most concerning (e.g., sudden worsening pain, fever, inability to tolerate by mouth) that necessitate immediate return. Medications administered to the  patient during their visit and any new prescriptions provided to the patient are listed below.  Medications given during this visit Medications - No data to display   The patient appears reasonably screen and/or stabilized for discharge and I doubt any other medical condition or other [facility] requiring further screening, evaluation, or treatment in the ED at this time prior to discharge.          Final Clinical Impression(s) / ED Diagnoses Final diagnoses:  Opiate overdose, accidental or unintentional, initial encounter [facility])    Rx / [place] Orders ED Discharge Orders     None         Deno Etienne, DO 11/20/21 [phone]

## 2021-11-20 NOTE — ED Notes (Signed)
Report called [facility] to inform them that pt would be returning

## 2021-11-20 NOTE — Progress Notes (Signed)
Oncology Nurse Navigator Documentation  Oncology Nurse Navigator Flowsheets 11/20/2021 11/13/2021  Abnormal Finding Date - 11/13/2021  Diagnosis Status Additional Work Up Additional Work Up  Navigator Follow Up Reason: Patient Call Appointment Review  Navigation Complete Date: 11/21/2021 11/14/2021  [facility]  Referral Date to RadOnc/MedOnc - 11/13/2021  Navigator Encounter Type Telephone Telephone  Telephone Outgoing Call Outgoing Call  Patient Visit Type Inpatient/I followed up on Ms. Weintraub's schedule. She is set up for pulmonary visit and scans.  I noticed today that patient is in the ED with a narcotic overdose. According to ED notes, patient is on hospice.  I called SNF, [facility] to get an update on patients status. Wait for call back.  -  Treatment Phase Abnormal Scans Abnormal Scans  Barriers/Navigation Needs Coordination of Care Coordination of Care;Education  Interventions Coordination of Care -  Acuity Level 2-Minimal Needs (1-2 Barriers Identified) Level 2-Minimal Needs (1-2 Barriers Identified)  Coordination of Care Other -  Time Spent with Patient 30 30

## 2021-11-20 NOTE — Discharge Instructions (Signed)
Please continue your hospice care.  If it anytime you wish to return to the ER to be evaluated then do not hesitate to come back.

## 2021-11-20 NOTE — ED Notes (Signed)
[facility] called for transport back to Nursing Home

## 2021-11-20 NOTE — ED Triage Notes (Signed)
Pt here from [facility] with c/o opoid  OD , pt was given .4 mg of narcan at the Nursing home , arrived awake to the ED , Md in to see pt ,

## 2021-11-21 ENCOUNTER — Encounter: Payer: Self-pay | Admitting: *Deleted

## 2021-11-21 NOTE — Progress Notes (Signed)
Oncology Nurse Navigator Documentation  Oncology Nurse Navigator Flowsheets 11/21/2021 11/20/2021 11/13/2021  Abnormal Finding Date - - 11/13/2021  Diagnosis Status - Additional Work Up Additional Work Up  Navigator Follow Up Reason: Patient Call Patient Call Appointment Review  Navigation Complete Date: 11/27/2021 11/21/2021 11/14/2021  [facility]  Referral Date to RadOnc/MedOnc - - 11/13/2021  Navigator Encounter Type Telephone Telephone Telephone  Telephone Outgoing Call/I called patient's SNF to check on Ms. Edelen and update them on her upcoming appts.  I was unable to reach anyone but did leave vm message with my name and phone number to call. Outgoing Call [place] Call  Patient Visit Type - Inpatient -  Treatment Phase Abnormal Scans Abnormal Scans Abnormal Scans  Barriers/Navigation Needs Coordination of Care Coordination of Care Coordination of Care;Education  Interventions Coordination of Care Coordination of Care -  Acuity Level 2-Minimal Needs (1-2 Barriers Identified) Level 2-Minimal Needs (1-2 Barriers Identified) Level 2-Minimal Needs (1-2 Barriers Identified)  Coordination of Care - Other -  Time Spent with Patient 30 30 30

## 2021-11-23 ENCOUNTER — Ambulatory Visit: Payer: Medicare Other | Admitting: Emergency Medicine

## 2021-11-28 ENCOUNTER — Encounter: Payer: Self-pay | Admitting: *Deleted

## 2021-11-28 NOTE — Progress Notes (Signed)
Oncology Nurse Navigator Documentation  Oncology Nurse Navigator Flowsheets 11/28/2021 11/21/2021 11/20/2021 11/13/2021  Abnormal Finding Date - - - 11/13/2021  Diagnosis Status - - Additional Work Up Additional Work Up  Navigator Follow Up Reason: Patient Call Patient Call Patient Call Appointment Review  Navigation Complete Date: 11/30/2021 11/27/2021 11/21/2021 11/14/2021  Navigator Location [facility]  Referral Date to RadOnc/MedOnc - - - 11/13/2021  Navigator Encounter Type Telephone/I called [facility] to talk to someone about Ms. Hassell Done.  I was hung up on twice. I called back and asked for the nursing director. I was unable to reach them or leave a vm message.   Telephone Telephone Telephone  Telephone Outgoing Call Outgoing Call [place] Call [place] Call  Patient Visit Type - - Inpatient -  Treatment Phase Abnormal Scans Abnormal Scans Abnormal Scans Abnormal Scans  Barriers/Navigation Needs Coordination of Care Coordination of Care Coordination of Care Coordination of Care;Education  Interventions Coordination of Care Coordination of Care Coordination of Care -  Acuity Level 2-Minimal Needs (1-2 Barriers Identified) Level 2-Minimal Needs (1-2 Barriers Identified) Level 2-Minimal Needs (1-2 Barriers Identified) Level 2-Minimal Needs (1-2 Barriers Identified)  Coordination of Care Other - Other -  Time Spent with Patient 30 30 30  30

## 2021-11-29 DIAGNOSIS — C3412 Malignant neoplasm of upper lobe, left bronchus or lung: Secondary | ICD-10-CM | POA: Insufficient documentation

## 2021-11-29 NOTE — Progress Notes (Signed)
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 LUNG, LEFT UPPEER LOBE, BRUSHING:  - Rare atypical squamous cells.   B. LUNG, LEFT UPPER LOBE, FINE NEEDLE ASPIRATION:  - No malignant cells identified.   C. LYMPH NODE, 4L, FINE NEEDLE ASPIRATION:  - Lymphoid tissue present.   D. LYMPH NODE, 7, FINE NEEDLE ASPIRATION:  - Lymphoid tissue present.   E. LYMPH NODE, 12L, FINE NEEDLE ASPIRATION:  - Lymphoid tissue present.   Past/Anticipated interventions by surgeon, if any:   Surgeon: Baltazar Apo Operation: Flexible video fiberoptic bronchoscopy with electromagnetic navigation, endobronchial [place] and biopsies.  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   Current Complaints / other details:  presented to the emergency department on November 13, 2021 complaining of chest pain that has been going on for few weeks located in the left upper chest and back.

## 2021-11-29 DEATH — deceased

## 2021-11-30 ENCOUNTER — Ambulatory Visit (HOSPITAL_COMMUNITY): Admission: RE | Admit: 2021-11-30 | Payer: Medicare Other | Source: Ambulatory Visit

## 2021-11-30 ENCOUNTER — Encounter: Payer: Self-pay | Admitting: *Deleted

## 2021-11-30 ENCOUNTER — Ambulatory Visit (HOSPITAL_COMMUNITY): Payer: Medicare Other

## 2021-11-30 NOTE — Progress Notes (Signed)
Oncology Nurse Navigator Documentation  Oncology Nurse Navigator Flowsheets 11/30/2021 11/28/2021 11/21/2021 11/20/2021 11/13/2021  Abnormal Finding Date - - - - 11/13/2021  Diagnosis Status - - - Additional Work Up Additional Work Up  Navigator Follow Up Reason: - Patient Call Patient Call Patient Call Appointment Review  Navigation Complete Date: - 11/30/2021 11/27/2021 11/21/2021 11/14/2021  Nav[facility]  Referral Date to RadOnc/MedOnc - - - - 11/13/2021  Navigator Encounter Type Telephone Telephone Telephone Telephone Telephone  Telephone Outgoing Call Outgoing Call Outgoing Call [place] Call [place] Call  Patient Visit Type - - - Inpatient -  Treatment Phase Abnormal Scans Abnormal Scans Abnormal Scans Abnormal Scans Abnormal Scans  Barriers/Navigation Needs Coordination of Care Coordination of Care Coordination of Care Coordination of Care Coordination of Care;Education  Interventions Coordination of Care/I called [place] 3 times today and was hung up on twice. I left a message for the DON to call me with my name and phone number.  Coordination of Care Coordination of Care Coordination of Care -  Acuity Level 2-Minimal Needs (1-2 Barriers Identified) Level 2-Minimal Needs (1-2 Barriers Identified) Level 2-Minimal Needs (1-2 Barriers Identified) Level 2-Minimal Needs (1-2 Barriers Identified) Level 2-Minimal Needs (1-2 Barriers Identified)  Coordination of Care Other Other - Other -  Time Spent with Patient 30 30 30 30  30

## 2021-12-01 ENCOUNTER — Telehealth: Payer: Self-pay | Admitting: Internal Medicine

## 2021-12-01 ENCOUNTER — Encounter: Payer: Self-pay | Admitting: *Deleted

## 2021-12-01 NOTE — Telephone Encounter (Signed)
Scheduled per 01/19 los, patient has been called and voicemail was left to patient's sister. I called [facility], no voicemail for me to leave and no one was there to talk to. Patient's personal number does not have voicemail set up.

## 2021-12-01 NOTE — Progress Notes (Signed)
Oncology Nurse Navigator Documentation  Oncology Nurse Navigator Flowsheets 12/01/2021 11/30/2021 11/28/2021 11/21/2021 11/20/2021 11/13/2021  Abnormal Finding Date - - - - - 11/13/2021  Diagnosis Status - - - - Additional Work Up Additional Work Up  Navigator Follow Up Reason: - - Patient Call Patient Call Patient Call Appointment Review  Navigation Complete Date: - - 11/30/2021 11/27/2021 11/21/2021 11/14/2021  Navigator Location [facility]  Referral Date to RadOnc/MedOnc - - - - - 11/13/2021  Navigator Encounter Type Telephone Telephone Telephone Telephone Telephone Telephone  Telephone Outgoing Call Outgoing Call [place] Call [place] Call [place] Call Outgoing Call  Patient Visit Type - - - - Inpatient -  Treatment Phase Abnormal Scans Abnormal Scans Abnormal Scans Abnormal Scans Abnormal Scans Abnormal Scans  Barriers/Navigation Needs Coordination of Care/I followed up to see if [name] made her mri brain appt. She did not. I called [place] several times this am and could not reach anyone.  Coordination of Care Coordination of Care Coordination of Care Coordination of Care Coordination of Care;Education  Interventions Coordination of Care Coordination of Care Coordination of Care Coordination of Care Coordination of Care -  Acuity Level 2-Minimal Needs (1-2 Barriers Identified) Level 2-Minimal Needs (1-2 Barriers Identified) Level 2-Minimal Needs (1-2 Barriers Identified) Level 2-Minimal Needs (1-2 Barriers Identified) Level 2-Minimal Needs (1-2 Barriers Identified) Level 2-Minimal Needs (1-2 Barriers Identified)  Coordination of Care - Other Other - Other -  Time Spent with Patient 30 30 30 30 30  30

## 2021-12-03 NOTE — Progress Notes (Incomplete)
Radiation Oncology         [phone] ________________________________  Initial Outpatient Consultation  Name: [name] MRN: [id number]  Date: 12/04/2021  DOB: 01-03-1946  VZ:SMOLMB, Denton Ar, MD  Curt Bears, MD   REFERRING PHYSICIAN: Curt Bears, MD  DIAGNOSIS: The encounter diagnosis was Malignant neoplasm of upper lobe, left bronchus or lung ([place]).  LUL mass  HISTORY OF PRESENT ILLNESS::[name] is a 76 y.o. female who is accompanied by ***. she is seen as a courtesy of Dr. Julien Nordmann for an opinion concerning radiation therapy as part of management for her diagnosed LUL mass. The patient initially underwent biopsies of the LUL mass in April of 2021 which were negative malignancy and showed atypical cells present. Nodal biopsies were also performed which were negative.  In recent history, the patient was sent to the [facility] ED on 11/13/21 from her SNF with worsening intermittent chest pain for several weeks.  The patient reported the pain as focused to the left upper chest/back and left upper extremity. CXR performed for evaluation on 11/13/21 revealed a left upper lobe mass at the site of a suspected tumor which was biopsied in 2021. The mass appeared enlarged since prior imaging and with new mediastinal widening concerning for malignancy and spread. A new right upper lobe nodule was appreciated as well, and atelectasis at the lung bases. Chest CT further demonstrated the progressive left apical lung mass with associated erosive changes involving the posteromedial aspect of the left fourth and third ribs, concerning for malignancy. CT also showed a new second subpleural mass within the medial apex of the right upper lobe was also seen worrisome for malignancy; a nonspecific 7 mm nodule within the periphery of the right upper lobe suspicious for metastatic disease; new enlarged right paratracheal lymph node worrisome for metastasis; and a a low-attenuation lesion within  the inferior right hepatic lobe  possibly suspicious for metastatic disease.   Subsequently, the patient was was referred to Dr. Julien Nordmann on 11/16/21 for further evaluation. During this visit, the patient reported continued pain to her left chest, 1 episode of cough productive of blood tinged sputum, and SOB at baseline and exertion. Following discussion of her options, the patient agreed to consider bronchoscopy and biopsies, and was referred to pulmonology.   On 11/20/21, the patient was sent to the ED due to an unintentional opiate overdose. Per encounter notes, the patient was recently switched to hospice care in her skilled nursing facility, and had been receiving narcotics as well as benzodiazepines. Following receiving her normal dose, her respirations decreased and she was given Narcan and sent to the ED for further evaluation. Upon arrival, the patient did not endorse any complaints and denied any further imaging or blood work, and requested to be sent back to her facility.  Of note: Patient is on 4L O2 at baseline, and currently smokes. Her smoking history includes smoking 2-3 ppd for 60+ years. She currently resides at the [facility].  The patient has yet to consult with pulmonology. Per records it seems as though the patient and the facility she lives at are hard to get in contact with. She was also a no-show for MRI ordered by Dr. Julien Nordmann.   PREVIOUS RADIATION THERAPY: {EXAM; YES/NO:19492::"No"}  PAST MEDICAL HISTORY:  Past Medical History:  Diagnosis Date   Anemia    Anxiety and depression    Arthritis    Asthma    Carotid artery occlusion    Chronic pain  leg and feet   CKD (chronic kidney disease), stage IV (HCC)    COPD (chronic obstructive pulmonary disease) (HCC)    Coronary artery disease    DDD (degenerative disc disease)    Diarrhea    chronic    DVT (deep venous thrombosis) (HCC)    Dyspnea    Fibromyalgia    GERD (gastroesophageal reflux disease)     Grave's disease    Headache    HOH (hard of hearing)    Hypertension    Lung nodule    Mitral regurgitation    OP (osteoporosis)    Parkinson's disease (HCC)    Peripheral vascular disease (HCC)    Protein calorie malnutrition (HCC)    PUD (peptic ulcer disease)    Recurrent upper respiratory infection (URI)    Rhinitis    Sleep apnea    Spinal stenosis of lumbar region 04/23/2013   Thrombophlebitis    Tobacco use disorder 04/23/2013   Vitamin B 12 deficiency     PAST SURGICAL HISTORY: Past Surgical History:  Procedure Laterality Date   CAROTID ENDARTERECTOMY Left Sept. 20,2011   cea   CHOLECYSTECTOMY  1999   GASTRECTOMY     age 23   Part of small intestin and part of stomach   LEFT HEART CATHETERIZATION WITH CORONARY ANGIOGRAM N/A 10/02/2011   Procedure: LEFT HEART CATHETERIZATION WITH CORONARY ANGIOGRAM;  Surgeon: Sueanne Margarita, MD;  Location: [facility];  Service: Cardiovascular;  Laterality: N/A;   VIDEO BRONCHOSCOPY WITH ENDOBRONCHIAL NAVIGATION N/A 02/10/2020   Procedure: Video Bronchoscopy With Endobronchial Navigation;  Surgeon: Collene Gobble, MD;  Location: [facility] OR;  Service: Thoracic;  Laterality: N/A;   VIDEO BRONCHOSCOPY WITH ENDOBRONCHIAL ULTRASOUND N/A 02/10/2020   Procedure: VIDEO BRONCHOSCOPY WITH ENDOBRONCHIAL ULTRASOUND;  Surgeon: Collene Gobble, MD;  Location: [facility];  Service: Thoracic;  Laterality: N/A;    FAMILY HISTORY:  Family History  Problem Relation Age of Onset   Diabetes Mother    Hyperlipidemia Mother    Heart attack Mother    Other Mother        varicose veins,respiratory,stroke   Heart disease Mother        before age 63   Hypertension Mother    Varicose Veins Mother    Diabetes Father    Heart disease Father        before age 37   Hyperlipidemia Father    Heart attack Father    Other Father        varicose veins   Hypertension Father    Varicose Veins Father    Diabetes Sister    Heart disease Sister        before age 48    Hyperlipidemia Sister    Heart attack Sister    Other Sister        varicose veins   Hypertension Sister    Varicose Veins Sister    Peripheral vascular disease Sister    Diabetes Sister    Hyperlipidemia Sister    Hypertension Sister    Varicose Veins Sister     SOCIAL HISTORY:  Social History   Tobacco Use   Smoking status: Every Day    Packs/day: 2.00    Years: 50.00    Pack years: 100.00    Types: Cigarettes   Smokeless tobacco: Never   Tobacco comments:    cessation info given and reviewed  Vaping Use   Vaping Use: Never used  Substance Use Topics   Alcohol  use: No    Alcohol/week: 0.0 standard drinks   Drug use: No    ALLERGIES: No Known Allergies  MEDICATIONS:  Current Outpatient Medications  Medication Sig Dispense Refill   Acetaminophen (TYLENOL PO) Take by mouth.     allopurinol (ZYLOPRIM) 100 MG tablet Take 1 tablet (100 mg total) by mouth daily.     amLODipine (NORVASC) 10 MG tablet Take 1 tablet (10 mg total) by mouth daily. 30 tablet    atorvastatin (LIPITOR) 20 MG tablet Take 1 tablet (20 mg total) by mouth at bedtime. 30 tablet 2   brimonidine (ALPHAGAN) 0.2 % ophthalmic solution Place 1 drop into the right eye in the morning and at bedtime.     budesonide (PULMICORT) 0.5 MG/2ML nebulizer solution Take 2 mLs (0.5 mg total) by nebulization 2 (two) times daily. 120 mL 1   CHOLECALCIFEROL PO Take by mouth.     cholestyramine light (PREVALITE) 4 g packet Take 4 g by mouth 2 (two) times daily.     Cyanocobalamin (VITAMIN B-12) 1000 MCG TABS Take 1 tablet (1,000 mcg total) by mouth daily.     diclofenac Sodium (VOLTAREN) 1 % GEL Apply topically 4 (four) times daily.     dorzolamide-timolol (COSOPT) 22.3-6.8 MG/ML ophthalmic solution Place 1 drop into the right eye 2 times daily.     DULoxetine (CYMBALTA) 20 MG capsule Take 20 mg by mouth daily.     ferrous sulfate 325 (65 FE) MG tablet Take 325 mg by mouth daily with breakfast.     furosemide (LASIX) 40 MG  tablet Take 1 tablet (40 mg total) by mouth daily. 30 tablet 0   gabapentin (NEURONTIN) 300 MG capsule Take 300 mg by mouth 3 (three) times daily.     HM LIDOCAINE PATCH EX Apply topically.     HYDROcodone-acetaminophen (NORCO/VICODIN) 5-325 MG tablet Take 1 tablet by mouth every 6 (six) hours as needed for moderate pain.     ipratropium-albuterol (DUONEB) 0.5-2.5 (3) MG/3ML SOLN Take 3 mLs by nebulization 2 (two) times daily. 360 mL 0   latanoprost (XALATAN) 0.005 % ophthalmic solution Place 1 drop into both eyes daily.     metoprolol succinate (TOPROL-XL) 25 MG 24 hr tablet Take 25 mg by mouth daily.     MODERNA COVID-19 VACCINE 100 MCG/0.5ML injection  (Patient not taking: Reported on 11/16/2021)     Multiple Vitamin (MULTIVITAMIN) capsule Take 1 capsule by mouth daily.     nitroGLYCERIN (NITROSTAT) 0.4 MG SL tablet Place 0.4 mg under the tongue every 5 (five) minutes as needed for chest pain. (Patient not taking: Reported on 11/16/2021)     Nutritional Supplements (RESOURCE 2.0 PO) Take by mouth.     pantoprazole (PROTONIX) 20 MG tablet Take 20 mg by mouth daily.     pilocarpine (PILOCAR) 2 % ophthalmic solution      predniSONE (DELTASONE) 5 MG tablet      tiotropium (SPIRIVA) 18 MCG inhalation capsule Place 18 mcg into inhaler and inhale daily.     traMADol (ULTRAM) 50 MG tablet Take by mouth.     TRAZODONE HCL PO Take by mouth.     No current facility-administered medications for this encounter.    REVIEW OF SYSTEMS:  A 10+ POINT REVIEW OF SYSTEMS WAS OBTAINED including neurology, dermatology, psychiatry, cardiac, respiratory, lymph, extremities, GI, GU, musculoskeletal, constitutional, reproductive, HEENT. ***   PHYSICAL EXAM:  vitals were not taken for this visit.   General: Alert and oriented, in no acute distress HEENT:  Head is normocephalic. Extraocular movements are intact. Oropharynx is clear. Neck: Neck is supple, no palpable cervical or supraclavicular lymphadenopathy. Heart:  Regular in rate and rhythm with no murmurs, rubs, or gallops. Chest: Clear to auscultation bilaterally, with no rhonchi, wheezes, or rales. Abdomen: Soft, nontender, nondistended, with no rigidity or guarding. Extremities: No cyanosis or edema. Lymphatics: see Neck Exam Skin: No concerning lesions. Musculoskeletal: symmetric strength and muscle tone throughout. Neurologic: Cranial nerves II through XII are grossly intact. No obvious focalities. Speech is fluent. Coordination is intact. Psychiatric: Judgment and insight are intact. Affect is appropriate. ***  ECOG = ***  0 - Asymptomatic (Fully active, able to carry on all predisease activities without restriction)  1 - Symptomatic but completely ambulatory (Restricted in physically strenuous activity but ambulatory and able to carry out work of a light or sedentary nature. For example, light housework, office work)  2 - Symptomatic, <50% in bed during the day (Ambulatory and capable of all self care but unable to carry out any work activities. Up and about more than 50% of waking hours)  3 - Symptomatic, >50% in bed, but not bedbound (Capable of only limited self-care, confined to bed or chair 50% or more of waking hours)  4 - Bedbound (Completely disabled. Cannot carry on any self-care. Totally confined to bed or chair)  5 - Death   Eustace Pen MM, Creech RH, Tormey DC, et al. [phone]). "Toxicity and response criteria of the [facility]". [place] Oncol. 5 (6): 649-55  LABORATORY DATA:  Lab Results  Component Value Date   WBC 7.5 11/13/2021   HGB 10.3 (L) 11/13/2021   HCT 33.4 (L) 11/13/2021   MCV 93.3 11/13/2021   PLT 230 11/13/2021   NEUTROABS 8.3 (H) 02/02/2020   Lab Results  Component Value Date   NA 139 11/13/2021   K 4.3 11/13/2021   CL 112 (H) 11/13/2021   CO2 19 (L) 11/13/2021   GLUCOSE 98 11/13/2021   CREATININE 1.95 (H) 11/13/2021   CALCIUM 8.5 (L) 11/13/2021      RADIOGRAPHY: DG Chest 2  View  Result Date: 11/13/2021 CLINICAL DATA:  Chest pain EXAM: CHEST - 2 VIEW COMPARISON:  02/11/2020 FINDINGS: Low volume chest with streaky density at the bases. Background of hyperinflation. Normal heart size. Right paratracheal fullness concerning for adenopathy in this setting left para median mass measuring 3.5 cm, enlarged from radiography in 2021. New right upper lobe nodule measuring 9 mm. Streaky density in the bilateral lower lungs. IMPRESSION: 1. Left upper lobe mass, at site of suspected tumor which was evaluated/biopsied in 2021. The mass has enlarged since prior and there is new mediastinal widening and right upper lobe nodule concerning for malignant spread, recommend updated chest CT. 2. Atelectasis at the lung bases. Electronically Signed   By: Jorje Guild M.D.   On: 11/13/2021 04:30   CT Chest Wo Contrast  Result Date: 11/13/2021 CLINICAL DATA:  Evaluate lung mass. EXAM: CT CHEST WITHOUT CONTRAST TECHNIQUE: Multidetector CT imaging of the chest was performed following the standard protocol without IV contrast. RADIATION DOSE REDUCTION: This exam was performed according to the departmental dose-optimization program which includes automated exposure control, adjustment of the mA and/or kV according to patient size and/or use of iterative reconstruction technique. COMPARISON:  CT chest 02/02/2020 FINDINGS: Cardiovascular: Heart size is normal. Aortic atherosclerosis. No pericardial effusion. Coronary artery calcifications. Mediastinum/Nodes: Normal appearance of the thyroid gland. The trachea appears patent and is midline. Normal appearance of the  esophagus. Mediastinal adenopathy is identified, including: -right paratracheal lymph node measuring 2.5 cm, image 59/2. Previously this measured 1.4 cm. -High right paratracheal lymph node measures 2.1 cm, image 49/2. Previously 0.9 cm. -Hilar lymph nodes are suboptimally evaluated due to lack of IV contrast material. Lungs/Pleura: No pleural  effusion identified. Bilateral lower lung zone predominant interstitial reticulation and parenchymal banding identified which is most likely postinflammatory/infectious in etiology. Paravertebral mass within the medial left apex measures 4.7 x 3.0 cm, image 28/5. Focal erosive changes along the third rib noted at the costovertebral junction. There also focal erosive changes involving the posteromedial aspect of the left fourth rib, image 26/2. Previously this lesion measured 1.7 x 1.1 cm. Subpleural mass within the medial apex of the right upper lobe is also identified measuring 4.0 x 2.1 cm, image 44/5. This is new compared with 02/02/2020. Satellite nodule within the adjacent right perihilar region measures 7 mm, image 54/5. Within the periphery of the right upper lobe there is a 7 mm nodule, image 60/5. Suspicious for metastatic disease. Upper Abdomen: No acute abnormality within the imaged portions of the upper abdomen. Inferior right hepatic lobe lesion is identified measuring 2.6 cm, image 174/2. This does not meet CT criteria for a simple cyst and this is a new finding compared with CT AP from 01/11/2015. Status post cholecystectomy with increase caliber of the common bile duct. Stable from previous exam. Progressive atrophy of the right kidney. Aortic atherosclerotic calcifications noted. Musculoskeletal: No acute findings identified. Signs of chest wall involvement of left apical lung mass noted as described above. IMPRESSION: 1. There is a progressive left apical lung mass with associated erosive changes involving the posteromedial aspect of the left fourth and third ribs. Findings are compatible with malignancy. Recommend nonemergent referral to multi disciplinary thoracic oncology for further management. 2. There is a second subpleural mass within the medial apex of the right upper lobe which is new compared with 02/02/2020 and is worrisome for malignancy. 3. There is a 7 mm nodule within the periphery  of the right upper lobe which is nonspecific but suspicious for metastatic disease. 4. There is a new, enlarged right paratracheal lymph node worrisome for metastasis. 5. There is a low-attenuation lesion within the inferior right hepatic lobe which does not meet CT criteria for a simple cyst. This is a new finding compared with AP from 02/02/2020. Cannot exclude metastatic disease. 6. Progressive atrophy of the right kidney. 7. Coronary artery calcifications. 8. Aortic Atherosclerosis (ICD10-I70.0). Electronically Signed   By: Kerby Moors M.D.   On: 11/13/2021 07:42      IMPRESSION: LUL mass  ***  Today, I talked to the patient and family about the findings and work-up thus far.  We discussed the natural history of *** and general treatment, highlighting the role of radiotherapy in the management.  We discussed the available radiation techniques, and focused on the details of logistics and delivery.  We reviewed the anticipated acute and late sequelae associated with radiation in this setting.  The patient was encouraged to ask questions that I answered to the best of my ability. *** A patient consent form was discussed and signed.  We retained a copy for our records.  The patient would like to proceed with radiation and will be scheduled for CT simulation.  PLAN: ***    *** minutes of total time was spent for this patient encounter, including preparation, face-to-face counseling with the patient and coordination of care, physical exam, and documentation of  the encounter.   ------------------------------------------------  Blair Promise, PhD, MD  This document serves as a record of services personally performed by Gery Pray, MD. It was created on his behalf by Roney Mans, a trained medical scribe. The creation of this record is based on the scribe's personal observations and the provider's statements to them. This document has been checked and approved by the attending provider.

## 2021-12-04 ENCOUNTER — Ambulatory Visit: Payer: Medicare Other

## 2021-12-04 ENCOUNTER — Ambulatory Visit: Admission: RE | Admit: 2021-12-04 | Payer: Medicare Other | Source: Ambulatory Visit | Admitting: Radiation Oncology

## 2021-12-04 DIAGNOSIS — C3412 Malignant neoplasm of upper lobe, left bronchus or lung: Secondary | ICD-10-CM

## 2021-12-07 ENCOUNTER — Inpatient Hospital Stay: Payer: Medicare Other | Admitting: Internal Medicine

## 2021-12-07 ENCOUNTER — Inpatient Hospital Stay: Payer: Medicare Other

## 2022-01-08 ENCOUNTER — Ambulatory Visit: Payer: Medicare Other | Admitting: Physician Assistant
